# Patient Record
Sex: Female | Born: 1970
Health system: Southern US, Community
[De-identification: ages and names within clinical notes are randomized; demographics above are authoritative.]

## PROBLEM LIST (undated history)

## (undated) DIAGNOSIS — R112 Nausea with vomiting, unspecified: Secondary | ICD-10-CM

## (undated) DIAGNOSIS — IMO0001 Reserved for inherently not codable concepts without codable children: Secondary | ICD-10-CM

## (undated) DIAGNOSIS — F329 Major depressive disorder, single episode, unspecified: Secondary | ICD-10-CM

## (undated) DIAGNOSIS — B009 Herpesviral infection, unspecified: Secondary | ICD-10-CM

## (undated) DIAGNOSIS — IMO0002 Reserved for concepts with insufficient information to code with codable children: Secondary | ICD-10-CM

## (undated) DIAGNOSIS — R600 Localized edema: Secondary | ICD-10-CM

## (undated) DIAGNOSIS — Z9889 Other specified postprocedural states: Secondary | ICD-10-CM

## (undated) DIAGNOSIS — F419 Anxiety disorder, unspecified: Secondary | ICD-10-CM

## (undated) DIAGNOSIS — C50911 Malignant neoplasm of unspecified site of right female breast: Secondary | ICD-10-CM

## (undated) DIAGNOSIS — R609 Edema, unspecified: Secondary | ICD-10-CM

## (undated) DIAGNOSIS — F32A Depression, unspecified: Secondary | ICD-10-CM

## (undated) HISTORY — PX: PLANTAR FASCIECTOMY: SUR600

## (undated) HISTORY — DX: Reserved for inherently not codable concepts without codable children: IMO0001

## (undated) HISTORY — DX: Herpesviral infection, unspecified: B00.9

## (undated) HISTORY — PX: OTHER SURGICAL HISTORY: SHX169

## (undated) HISTORY — DX: Reserved for concepts with insufficient information to code with codable children: IMO0002

---

## 1999-06-04 ENCOUNTER — Other Ambulatory Visit: Admission: RE | Admit: 1999-06-04 | Discharge: 1999-06-04 | Payer: Self-pay | Admitting: *Deleted

## 2000-06-15 ENCOUNTER — Other Ambulatory Visit: Admission: RE | Admit: 2000-06-15 | Discharge: 2000-06-15 | Payer: Self-pay | Admitting: *Deleted

## 2005-07-15 ENCOUNTER — Ambulatory Visit: Payer: Self-pay | Admitting: Orthopedic Surgery

## 2005-08-31 ENCOUNTER — Ambulatory Visit: Payer: Self-pay | Admitting: Orthopedic Surgery

## 2006-12-08 ENCOUNTER — Ambulatory Visit: Payer: Self-pay | Admitting: Unknown Physician Specialty

## 2008-01-17 ENCOUNTER — Emergency Department: Payer: Self-pay | Admitting: Emergency Medicine

## 2008-01-24 ENCOUNTER — Ambulatory Visit: Payer: Self-pay

## 2008-02-14 ENCOUNTER — Ambulatory Visit: Payer: Self-pay

## 2008-06-10 ENCOUNTER — Emergency Department: Payer: Self-pay | Admitting: Emergency Medicine

## 2009-04-27 ENCOUNTER — Emergency Department: Payer: Self-pay | Admitting: Emergency Medicine

## 2013-03-14 ENCOUNTER — Encounter: Payer: Self-pay | Admitting: *Deleted

## 2013-03-19 ENCOUNTER — Ambulatory Visit (INDEPENDENT_AMBULATORY_CARE_PROVIDER_SITE_OTHER): Payer: 59 | Admitting: Podiatry

## 2013-03-19 ENCOUNTER — Encounter: Payer: Self-pay | Admitting: Podiatry

## 2013-03-19 ENCOUNTER — Ambulatory Visit (INDEPENDENT_AMBULATORY_CARE_PROVIDER_SITE_OTHER): Payer: 59

## 2013-03-19 VITALS — BP 122/77 | HR 70 | Resp 16 | Ht 64.5 in | Wt 250.0 lb

## 2013-03-19 DIAGNOSIS — M79609 Pain in unspecified limb: Secondary | ICD-10-CM

## 2013-03-19 DIAGNOSIS — M79671 Pain in right foot: Secondary | ICD-10-CM

## 2013-03-19 DIAGNOSIS — M79672 Pain in left foot: Secondary | ICD-10-CM

## 2013-03-19 DIAGNOSIS — M722 Plantar fascial fibromatosis: Secondary | ICD-10-CM

## 2013-03-19 DIAGNOSIS — M7150 Other bursitis, not elsewhere classified, unspecified site: Secondary | ICD-10-CM

## 2013-03-19 NOTE — Progress Notes (Signed)
Krystal Kelley presents today complaining of pain to the fifth metatarsophalangeal joint area of the right foot. He states that it bothers her with shoe gear. She's also complaining of pain to the plantar lateral aspect of the left heel anterior to the incision site of the endoscopic plantar fasciotomy that was performed several months ago. She states that this area does feels tender and painful with shoe gear.  Objective: Vital signs are stable she is alert and oriented x3. Pulses are strong and equal bilateral foot. She has pain on palpation fifth metatarsophalangeal joint with overlying boggy fluid consistency and tenderness on range of motion of the fifth toe. The contralateral foot does demonstrate pain on palpation just distal to the calcaneus plantar lateral aspect of the left foot. Ready graphic evaluation does not demonstrate any type of osseous abnormalities.  Assessment: Capsulitis bursitis with mild Taylor's bunion deformity fifth metatarsophalangeal joint of the right foot. Residual lateral band plantar fasciitis left foot.  Plan: Injected dexamethasone to the point of maximal tenderness fifth metatarsophalangeal joint right foot. And injected the plantar lateral aspect of the left foot with Kenalog and local anesthetic. Followup with me in one month

## 2013-04-19 HISTORY — PX: CHOLECYSTECTOMY: SHX55

## 2013-04-30 ENCOUNTER — Ambulatory Visit: Payer: 59 | Admitting: Podiatry

## 2013-05-09 ENCOUNTER — Encounter: Payer: Self-pay | Admitting: Podiatry

## 2013-05-09 ENCOUNTER — Ambulatory Visit (INDEPENDENT_AMBULATORY_CARE_PROVIDER_SITE_OTHER): Payer: 59 | Admitting: Podiatry

## 2013-05-09 VITALS — BP 121/86 | HR 109 | Resp 16 | Ht 64.0 in | Wt 245.8 lb

## 2013-05-09 DIAGNOSIS — M722 Plantar fascial fibromatosis: Secondary | ICD-10-CM

## 2013-05-09 NOTE — Progress Notes (Signed)
She presents today for followup of her bilateral foot pain. She states that the last injections we gave must of work she said it took a little while for them to start work at this work eventually. She still concerned about the left orthotic not fitting appropriately. I have evaluated this twice now and suggested shoe gear changes. She has not made shoe gear changes as of yet. Currently her feet are hurting.  Objective: Vital signs are stable she is alert and oriented x3. Pulses are palpable bilateral foot. I reevaluated the orthotics today and evaluated her shoes. She has a rectus foot which is pitting in a curved shoe with a straight orthotic to match her foot. This is resulting and wear to the lateral aspect of the insole of the shoe and it feels to her that her foot is in varus position. Evaluating the orthotics without the shoes having the patient stand on these the foot is rectus the heel is vertical and the forefoot is down.  Assessment: History of plantar fasciitis pes planus lateral compensatory syndrome.  Plan: Discussed appropriate shoe gear stretching exercises ice therapy shoe gear modifications. Encouraged shoe gear change. I will followup with her once she gets a new pair shoes and reevaluate his orthotics. If at that point his left foot does not feel better then we will send his orthotics back and have a pair of orthotics casted rather than scanned.  She was also asking about references for a hand surgeon for her carpal tunnel left. I gave her names of 2 hand orthopedic surgeons in [place] and of the neurosurgery group.

## 2013-06-09 ENCOUNTER — Emergency Department: Payer: Self-pay | Admitting: Emergency Medicine

## 2013-06-09 LAB — CBC
HCT: 40.9 % (ref 35.0–47.0)
HGB: 13.9 g/dL (ref 12.0–16.0)
MCH: 31.9 pg (ref 26.0–34.0)
MCHC: 33.9 g/dL (ref 32.0–36.0)
MCV: 94 fL (ref 80–100)
Platelet: 419 10*3/uL (ref 150–440)
RBC: 4.35 10*6/uL (ref 3.80–5.20)
RDW: 13.5 % (ref 11.5–14.5)
WBC: 10.9 x10 3/mm 3 (ref 3.6–11.0)

## 2013-06-09 LAB — COMPREHENSIVE METABOLIC PANEL
Albumin: 3.6 g/dL (ref 3.4–5.0)
Alkaline Phosphatase: 64 U/L
Bilirubin,Total: 1.9 mg/dL — ABNORMAL HIGH (ref 0.2–1.0)
Co2: 31 mmol/L (ref 21–32)
Creatinine: 1.06 mg/dL (ref 0.60–1.30)
EGFR (Non-African Amer.): >60
Glucose: 110 mg/dL — ABNORMAL HIGH (ref 65–99)
Potassium: 3.8 mmol/L (ref 3.5–5.1)
SGOT(AST): 170 U/L — ABNORMAL HIGH (ref 15–37)
Sodium: 137 mmol/L (ref 136–145)
Total Protein: 8.4 g/dL — ABNORMAL HIGH (ref 6.4–8.2)

## 2013-06-09 LAB — COMPREHENSIVE METABOLIC PANEL WITH GFR
Anion Gap: 5 — ABNORMAL LOW (ref 7–16)
BUN: 11 mg/dL (ref 7–18)
Calcium, Total: 9.4 mg/dL (ref 8.5–10.1)
Chloride: 101 mmol/L (ref 98–107)
EGFR (African American): >60
Osmolality: 274 (ref 275–301)
SGPT (ALT): 133 U/L — ABNORMAL HIGH (ref 12–78)

## 2013-06-09 LAB — TROPONIN I: Troponin-I: <0.02 ng/mL

## 2013-06-09 LAB — LIPASE, BLOOD: Lipase: 144 U/L (ref 73–393)

## 2013-06-10 ENCOUNTER — Inpatient Hospital Stay: Payer: Self-pay | Admitting: Surgery

## 2013-06-10 LAB — HEMATOCRIT: HCT: 39.7 % (ref 35.0–47.0)

## 2013-06-10 LAB — CREATININE, SERUM
Creatinine: 1.22 mg/dL (ref 0.60–1.30)
EGFR (African American): >60
EGFR (Non-African Amer.): 54 — ABNORMAL LOW

## 2013-06-10 LAB — PLATELET COUNT: Platelet: 411 10*3/uL (ref 150–440)

## 2013-06-10 LAB — HEMOGLOBIN: HGB: 13.4 g/dL (ref 12.0–16.0)

## 2013-06-11 LAB — CBC WITH DIFFERENTIAL/PLATELET
Basophil #: 0.1 10*3/uL (ref 0.0–0.1)
Basophil %: 0.5 %
Eosinophil #: 0.1 10*3/uL (ref 0.0–0.7)
Eosinophil %: 0.9 %
HCT: 37.8 % (ref 35.0–47.0)
HGB: 13 g/dL (ref 12.0–16.0)
Lymphocyte %: 24.2 %
Lymphs Abs: 2.4 x10 3/mm 3 (ref 1.0–3.6)
MCH: 32.3 pg (ref 26.0–34.0)
MCHC: 34.2 g/dL (ref 32.0–36.0)
MCV: 94 fL (ref 80–100)
Monocyte #: 0.6 x10 3/mm (ref 0.2–0.9)
Monocyte %: 5.9 %
Neutrophil #: 6.9 10*3/uL — ABNORMAL HIGH (ref 1.4–6.5)
Neutrophil %: 68.5 %
Platelet: 374 10*3/uL (ref 150–440)
RBC: 4.01 X10 6/mm 3 (ref 3.80–5.20)
RDW: 14.1 % (ref 11.5–14.5)
WBC: 10.1 x10 3/mm 3 (ref 3.6–11.0)

## 2013-06-11 LAB — COMPREHENSIVE METABOLIC PANEL
Alkaline Phosphatase: 88 U/L
Anion Gap: 7 (ref 7–16)
BUN: 15 mg/dL (ref 7–18)
Bilirubin,Total: 3.5 mg/dL — ABNORMAL HIGH (ref 0.2–1.0)
Co2: 26 mmol/L (ref 21–32)
Creatinine: 1.16 mg/dL (ref 0.60–1.30)
EGFR (African American): >60
EGFR (Non-African Amer.): 58 — ABNORMAL LOW
Potassium: 3.2 mmol/L — ABNORMAL LOW (ref 3.5–5.1)
SGOT(AST): 165 U/L — ABNORMAL HIGH (ref 15–37)
SGPT (ALT): 193 U/L — ABNORMAL HIGH (ref 12–78)
Sodium: 135 mmol/L — ABNORMAL LOW (ref 136–145)
Total Protein: 7.3 g/dL (ref 6.4–8.2)

## 2013-06-11 LAB — COMPREHENSIVE METABOLIC PANEL WITH GFR
Albumin: 3.2 g/dL — ABNORMAL LOW (ref 3.4–5.0)
Calcium, Total: 8.7 mg/dL (ref 8.5–10.1)
Chloride: 102 mmol/L (ref 98–107)
Glucose: 102 mg/dL — ABNORMAL HIGH (ref 65–99)
Osmolality: 271 (ref 275–301)

## 2013-06-11 LAB — PREGNANCY, URINE: Pregnancy Test, Urine: NEGATIVE m[IU]/mL

## 2013-06-12 LAB — HEPATIC FUNCTION PANEL A (ARMC)
Albumin: 3 g/dL — ABNORMAL LOW (ref 3.4–5.0)
Alkaline Phosphatase: 75 U/L
Bilirubin, Direct: 0.3 mg/dL — ABNORMAL HIGH (ref 0.00–0.20)
Bilirubin,Total: 0.5 mg/dL (ref 0.2–1.0)
SGOT(AST): 137 U/L — ABNORMAL HIGH (ref 15–37)
SGPT (ALT): 192 U/L — ABNORMAL HIGH (ref 12–78)
Total Protein: 7.1 g/dL (ref 6.4–8.2)

## 2013-06-13 LAB — PATHOLOGY REPORT

## 2013-09-03 ENCOUNTER — Ambulatory Visit (INDEPENDENT_AMBULATORY_CARE_PROVIDER_SITE_OTHER): Payer: 59 | Admitting: Podiatry

## 2013-09-03 VITALS — BP 116/73 | HR 104 | Resp 16 | Ht 65.0 in | Wt 238.0 lb

## 2013-09-03 DIAGNOSIS — M7750 Other enthesopathy of unspecified foot: Secondary | ICD-10-CM

## 2013-09-03 DIAGNOSIS — M722 Plantar fascial fibromatosis: Secondary | ICD-10-CM

## 2013-09-03 DIAGNOSIS — M7751 Other enthesopathy of right foot: Secondary | ICD-10-CM

## 2013-09-03 NOTE — Progress Notes (Signed)
She presents today with a chief complaint of pain to the fifth metatarsal head of the right foot and pain to the plantar lateral and plantar central aspect of the left foot. I have reviewed her past medical history medications and allergies.  Objective: Pulses are strongly palpable bilateral. She has pain on palpation of the fifth metatarsal head of the right foot with a palpable bursitis. She also has pain to the lateral aspect of the left foot and plantar central aspect of the left foot associated with the fourth fifth met cuboid articulation area. More than likely this is still long-standing plantar fasciitis.  Assessment: Capsulitis bursitis fifth metatarsophalangeal joint of the right foot plantar fasciitis central band lateral aspect left foot.  Plan: Injected dexamethasone and local anesthetic to the fifth metatarsophalangeal joint right foot. And injected the left lateral and plantar aspect of the foot with Kenalog and local anesthetic. She will bring her orthotics with her next time because she thinks that her orthotics are causing her to walk to the lateral side of her foot. I will followup with her in one month

## 2013-09-24 ENCOUNTER — Ambulatory Visit: Payer: Self-pay | Admitting: Podiatry

## 2013-09-27 ENCOUNTER — Encounter: Payer: Self-pay | Admitting: Podiatry

## 2013-09-27 ENCOUNTER — Ambulatory Visit (INDEPENDENT_AMBULATORY_CARE_PROVIDER_SITE_OTHER): Payer: 59 | Admitting: Podiatry

## 2013-09-27 DIAGNOSIS — M7150 Other bursitis, not elsewhere classified, unspecified site: Secondary | ICD-10-CM

## 2013-09-27 DIAGNOSIS — G5792 Unspecified mononeuropathy of left lower limb: Secondary | ICD-10-CM

## 2013-09-27 DIAGNOSIS — G5790 Unspecified mononeuropathy of unspecified lower limb: Secondary | ICD-10-CM

## 2013-09-28 NOTE — Progress Notes (Signed)
She presents today with a chief complaint of pain to her left hee and pain to the fifth metatarsophalangeal joint area of the right foot.  Objective: She has pain on palpation of the left heel. Chronic plantar fasciitis versus neuritis. Pain on palpation to the fifth metatarsophalangeal joint and oriented range of motion right foot.  Assessment: Plantar fasciitis versus neuritis left foot. Capsulitis bursitis fifth metatarsophalangeal joint right foot.  Plan: Injected Kenalog and local anesthetic fifth metatarsophalangeal joint right foot today started her first dose of dehydrated alcohol left foot today. Followup with her in 3 weeks

## 2013-10-01 ENCOUNTER — Ambulatory Visit: Payer: 59 | Admitting: Podiatry

## 2013-10-08 ENCOUNTER — Encounter: Payer: Self-pay | Admitting: Podiatry

## 2013-10-15 ENCOUNTER — Encounter: Payer: Self-pay | Admitting: Podiatry

## 2013-10-17 ENCOUNTER — Ambulatory Visit: Payer: 59 | Admitting: Podiatry

## 2013-10-22 ENCOUNTER — Ambulatory Visit: Payer: 59 | Admitting: Podiatry

## 2013-10-29 ENCOUNTER — Encounter: Payer: Self-pay | Admitting: Podiatry

## 2013-10-29 ENCOUNTER — Ambulatory Visit: Payer: 59 | Admitting: Podiatry

## 2013-10-29 VITALS — BP 139/88 | HR 100 | Resp 12

## 2013-10-29 DIAGNOSIS — G5790 Unspecified mononeuropathy of unspecified lower limb: Secondary | ICD-10-CM

## 2013-10-29 DIAGNOSIS — M778 Other enthesopathies, not elsewhere classified: Secondary | ICD-10-CM

## 2013-10-29 DIAGNOSIS — G5792 Unspecified mononeuropathy of left lower limb: Secondary | ICD-10-CM

## 2013-10-29 DIAGNOSIS — M79609 Pain in unspecified limb: Secondary | ICD-10-CM

## 2013-10-29 DIAGNOSIS — M779 Enthesopathy, unspecified: Secondary | ICD-10-CM

## 2013-10-29 DIAGNOSIS — M7750 Other enthesopathy of unspecified foot: Secondary | ICD-10-CM

## 2013-10-29 NOTE — Progress Notes (Signed)
She presents today complaining that her orthotics maker rollout of the left foot. She's also complaining of pain to the plantar aspect of the fifth metatarsal base of the fifth metatarsocuboid articulation. She states that her left foot is doing much better but the heel is still tender sometimes.  Objective: Vital signs are stable she is alert and oriented x3. She has pain on palpation to the plantar aspect of the fifth metatarsal base of the right foot. And to the medial left heel.  Assessment: Neuritis left foot capsulitis right foot.  Plan: Discussed etiology pathology conservative versus surgical therapies. I injected Kenalog fifth metatarsal base of the right foot. I injected her second dose of dehydrated alcohol to her left heel. Followup with her in 3 weeks. I did suggest with a pad the lateral aspect of her left orthotic. Should this not work for her then we will consider getting her  .[name]

## 2013-11-19 ENCOUNTER — Ambulatory Visit: Payer: 59 | Admitting: Podiatry

## 2013-11-26 ENCOUNTER — Ambulatory Visit (INDEPENDENT_AMBULATORY_CARE_PROVIDER_SITE_OTHER): Payer: 59 | Admitting: Podiatry

## 2013-11-26 ENCOUNTER — Encounter: Payer: Self-pay | Admitting: Podiatry

## 2013-11-26 VITALS — BP 128/88 | HR 99 | Resp 16

## 2013-11-26 DIAGNOSIS — M722 Plantar fascial fibromatosis: Secondary | ICD-10-CM

## 2013-11-26 DIAGNOSIS — G5790 Unspecified mononeuropathy of unspecified lower limb: Secondary | ICD-10-CM

## 2013-11-26 DIAGNOSIS — G5792 Unspecified mononeuropathy of left lower limb: Secondary | ICD-10-CM

## 2013-11-26 NOTE — Progress Notes (Signed)
She presents today for followup of her fifth metatarsal base capsulitis and neuritis to her left foot.  Objective: Vital signs are stable she is alert and oriented x3. She has no pain on palpation fifth metatarsal base of the right foot. However she does have pain on palpation to the plantar medial calcaneal tubercle of the left heel.  Assessment: Plantar fasciitis/neuritis plantar left heel. Resolving capsulitis tendinitis right foot.  Plan: Injected her second dose of dehydrated alcohol to the left heel today. Followup with her in 3 weeks.

## 2013-12-10 ENCOUNTER — Telehealth: Payer: Self-pay | Admitting: *Deleted

## 2013-12-10 NOTE — Telephone Encounter (Signed)
Marie tickle with [employer] called stating Adeli has to be consistant with dress code policy. Lelan Pons asked me to write down and fax her that pt is to wear tennis shoes only to work mon-fri. On same letter that was sent over on [phone]. Faxed info to [phone].

## 2013-12-11 ENCOUNTER — Telehealth: Payer: Self-pay | Admitting: *Deleted

## 2013-12-11 NOTE — Telephone Encounter (Signed)
Pt called asking about note sent over to her employer. i stated that the employer said the wearing of her shoes has to be consistent Mon-Fri. i stated per dr Milinda Pointer pt is to wear tennis shoes M-F to work. Pt understood.

## 2013-12-17 ENCOUNTER — Ambulatory Visit: Payer: 59 | Admitting: Podiatry

## 2013-12-27 ENCOUNTER — Ambulatory Visit: Payer: 59 | Admitting: Podiatry

## 2013-12-31 ENCOUNTER — Ambulatory Visit (INDEPENDENT_AMBULATORY_CARE_PROVIDER_SITE_OTHER): Payer: 59 | Admitting: Podiatry

## 2013-12-31 VITALS — BP 137/86 | HR 113 | Resp 16

## 2013-12-31 DIAGNOSIS — G5790 Unspecified mononeuropathy of unspecified lower limb: Secondary | ICD-10-CM

## 2013-12-31 DIAGNOSIS — G5792 Unspecified mononeuropathy of left lower limb: Secondary | ICD-10-CM

## 2013-12-31 DIAGNOSIS — M7150 Other bursitis, not elsewhere classified, unspecified site: Secondary | ICD-10-CM

## 2013-12-31 NOTE — Progress Notes (Signed)
She presents today for followup of her neuritis and plantar fasciitis her left foot she states it is better than it has been previously  Objective: Vital signs are stable she is alert and oriented x3. There is no erythema edema saline is drainage or odor she has mild tenderness on palpation medial continued tubercle of the left heel.  Assessment: In limb secondary to flexors neuritis/plantar fasciitis.  Plan: Injected dehydrated alcohol left heel. Followup with her in 3-4 weeks

## 2014-01-17 ENCOUNTER — Ambulatory Visit: Payer: Self-pay

## 2014-01-21 ENCOUNTER — Ambulatory Visit: Payer: 59 | Admitting: Podiatry

## 2014-02-13 ENCOUNTER — Encounter: Payer: Self-pay | Admitting: Podiatry

## 2014-02-13 ENCOUNTER — Ambulatory Visit (INDEPENDENT_AMBULATORY_CARE_PROVIDER_SITE_OTHER): Payer: 59 | Admitting: Podiatry

## 2014-02-13 DIAGNOSIS — M722 Plantar fascial fibromatosis: Secondary | ICD-10-CM

## 2014-02-13 DIAGNOSIS — G5792 Unspecified mononeuropathy of left lower limb: Secondary | ICD-10-CM

## 2014-02-13 NOTE — Progress Notes (Signed)
She presents today for follow-up of neuritis to her plantar left heel. She states that the alcohol injections are working quite well. She states that it only hurts a little bit now.  Objective: Vital signs are stable she is alert and oriented 3. No erythema edema saline as drainage or odor. Minimal pain on palpation medially continue tubercle left.  Assessment: Plantar fasciitis and neuritis left heel.  Plan: Injected another dose of dehydrated alcohol to the left heel. We'll follow up with her in 3 weeks if needed.

## 2014-03-06 ENCOUNTER — Ambulatory Visit (INDEPENDENT_AMBULATORY_CARE_PROVIDER_SITE_OTHER): Payer: 59 | Admitting: Podiatry

## 2014-03-06 VITALS — BP 137/90 | HR 102 | Resp 16

## 2014-03-06 DIAGNOSIS — G5792 Unspecified mononeuropathy of left lower limb: Secondary | ICD-10-CM

## 2014-03-06 DIAGNOSIS — Q828 Other specified congenital malformations of skin: Secondary | ICD-10-CM

## 2014-03-06 DIAGNOSIS — M722 Plantar fascial fibromatosis: Secondary | ICD-10-CM

## 2014-03-06 NOTE — Progress Notes (Signed)
She presents today for a follow-up of neuritis left heel. She states it is almost done.  Objective: Pulses are palpable. No reproducible pain left heel today. Her keratotic lesion plantar lateral aspect left foot.  Assessment: Plantar fasciitis/neuritis Baxter's nerve left. Porokeratosis left.  Plan: Debridement of porokeratotic lesion. Injected her final dose of dehydrated alcohol today. We'll follow up with her next visit and reevaluate her orthotics.

## 2014-03-27 ENCOUNTER — Ambulatory Visit (INDEPENDENT_AMBULATORY_CARE_PROVIDER_SITE_OTHER): Payer: 59 | Admitting: Podiatry

## 2014-03-27 VITALS — BP 141/84 | HR 114 | Resp 16

## 2014-03-27 DIAGNOSIS — G5792 Unspecified mononeuropathy of left lower limb: Secondary | ICD-10-CM

## 2014-03-27 DIAGNOSIS — M722 Plantar fascial fibromatosis: Secondary | ICD-10-CM

## 2014-03-27 NOTE — Progress Notes (Signed)
She presents today stating that she is here to get her shot that she states that her heel is approximately 95% improved.  Objective: Vital signs are stable she is alert and oriented 3. She has pain on palpation he continue typical of the left heel.  Assessment: Chronic intractable neuritis point her fasciitis left heel.  Plan: Reinjected today with dehydrated alcohol follow-up with her in 3-4 weeks.

## 2014-04-19 DIAGNOSIS — C50911 Malignant neoplasm of unspecified site of right female breast: Secondary | ICD-10-CM

## 2014-04-19 HISTORY — DX: Malignant neoplasm of unspecified site of right female breast: C50.911

## 2014-04-22 ENCOUNTER — Ambulatory Visit: Payer: 59 | Admitting: Podiatry

## 2014-04-29 ENCOUNTER — Ambulatory Visit: Payer: 59 | Admitting: Podiatry

## 2014-05-13 ENCOUNTER — Ambulatory Visit (INDEPENDENT_AMBULATORY_CARE_PROVIDER_SITE_OTHER): Payer: 59 | Admitting: Podiatry

## 2014-05-13 DIAGNOSIS — G5792 Unspecified mononeuropathy of left lower limb: Secondary | ICD-10-CM

## 2014-05-13 DIAGNOSIS — G5761 Lesion of plantar nerve, right lower limb: Secondary | ICD-10-CM

## 2014-05-13 DIAGNOSIS — G5781 Other specified mononeuropathies of right lower limb: Secondary | ICD-10-CM

## 2014-05-13 NOTE — Progress Notes (Signed)
She presents today for follow-up of her neuritis to her left heel. Also complaining of paresthesias and numbness to the fifth metatarsophalangeal joint area of the right foot. She denies any trauma.  Objective: Vital signs stable she is alert and oriented 3. Pulses are palpable bilateral. Neurologic sensorium is intact per Semmes-Weinstein monofilament. She has pain on palpation with a palpable Mulder's click to the third interdigital space of the right foot more than likely resulting in her paresthesias. She has minimal tenderness on palpation of the plantar medial calcaneal tubercle at Baxter's nerve left foot.  Assessment: Probable neuroma third interdigital space right foot. Baxter's neuritis/plantar fasciitis left foot.  Plan: Injected the third interdigital space with Kenalog and local anesthetic today. Injected the left heel was dehydrated alcohol and I will follow-up with her in 3 weeks.

## 2014-05-16 HISTORY — PX: BREAST BIOPSY: SHX20

## 2014-05-20 ENCOUNTER — Other Ambulatory Visit: Payer: Self-pay | Admitting: Radiology

## 2014-05-20 DIAGNOSIS — C50911 Malignant neoplasm of unspecified site of right female breast: Secondary | ICD-10-CM

## 2014-05-23 ENCOUNTER — Telehealth: Payer: Self-pay | Admitting: *Deleted

## 2014-05-23 NOTE — Telephone Encounter (Signed)
Received a referral from [facility].  Called and left a message for the pt to return my call so I can schedule a med onc appt.

## 2014-05-23 NOTE — Telephone Encounter (Signed)
Pt returned my call and I confirmed 05/30/14 appt w/ her. Mailed before appt letter, calendar, welcoming packet & intake form to pt.  Emailed Engineer, civil (consulting) at [employer] to make her aware.  Informed Bary Castilla for the conference list.

## 2014-05-28 ENCOUNTER — Encounter: Payer: Self-pay | Admitting: Radiation Oncology

## 2014-05-28 ENCOUNTER — Ambulatory Visit
Admission: RE | Admit: 2014-05-28 | Discharge: 2014-05-28 | Disposition: A | Discharge status: Home / Self Care | Payer: 59 | Source: Ambulatory Visit | Attending: Radiology | Admitting: Radiology

## 2014-05-28 DIAGNOSIS — C50911 Malignant neoplasm of unspecified site of right female breast: Secondary | ICD-10-CM

## 2014-05-28 MED ORDER — GADOBENATE DIMEGLUMINE 529 MG/ML IV SOLN
20.0000 mL | Freq: Once | INTRAVENOUS | Status: AC | PRN
Start: 2014-05-28 — End: 2014-05-28

## 2014-05-28 NOTE — Progress Notes (Signed)
Location of Breast Cancer: right lower inner breast cancer   Histology per Pathology Report:   05/16/14   Receptor Status: ER(95% positive), PR (77% positive), Her2-neu (positive)  Did patient present with symptoms (if so, please note symptoms) or was this found on screening mammography?: patient was able to feel a lump near the skin in the medial aspect of her right breast.  Past/Anticipated interventions by surgeon, if any: possible radioactive seed localized right breast lumpectomy and right axillary sentinel lymph node biopsy with port a cath placement pending results of MRI and medical oncology evaluation.  Past/Anticipated interventions by medical oncology, if any: has apt with Dr. Lindi Adie on 05/30/14  Lymphedema issues, if any: no  Pain issues, if any: no  OB GYN history: age of menarche: 13 years, has used BCP for 30 years,  Has not had any children, she does have a niece that she takes care of.  SAFETY ISSUES:  Prior radiation? yes  Pacemaker/ICD? no  Possible current pregnancy?no  Is the patient on methotrexate? no  Current Complaints / other details:  Patient is here with her mother.     Jacqulyn Liner, RN 05/28/2014,8:19 AM

## 2014-05-29 ENCOUNTER — Encounter: Payer: Self-pay | Admitting: Radiation Oncology

## 2014-05-29 ENCOUNTER — Other Ambulatory Visit (INDEPENDENT_AMBULATORY_CARE_PROVIDER_SITE_OTHER): Payer: Self-pay | Admitting: General Surgery

## 2014-05-29 ENCOUNTER — Ambulatory Visit
Admission: RE | Admit: 2014-05-29 | Discharge: 2014-05-29 | Disposition: A | Discharge status: Home / Self Care | Payer: 59 | Source: Ambulatory Visit | Attending: Radiation Oncology | Admitting: Radiation Oncology

## 2014-05-29 VITALS — BP 127/81 | HR 94 | Temp 98.0°F | Resp 16 | Ht 65.0 in | Wt 235.1 lb

## 2014-05-29 DIAGNOSIS — C50311 Malignant neoplasm of lower-inner quadrant of right female breast: Secondary | ICD-10-CM | POA: Diagnosis not present

## 2014-05-29 DIAGNOSIS — C50911 Malignant neoplasm of unspecified site of right female breast: Secondary | ICD-10-CM

## 2014-05-29 HISTORY — DX: Malignant neoplasm of unspecified site of right female breast: C50.911

## 2014-05-29 NOTE — Progress Notes (Signed)
Please see the Nurse Progress Note in the MD Initial Consult Encounter for this patient. 

## 2014-05-29 NOTE — Progress Notes (Signed)
Radiation Oncology         [phone]) [phone] ________________________________  Initial Outpatient Consultation  Name: Krystal Kelley MRN: [id number]  Date: 05/29/2014  DOB: 1970/05/07  VB:TYOM,AYOKH C, MD  Excell Seltzer, MD   REFERRING PHYSICIAN: Excell Seltzer, MD  DIAGNOSIS: Clinical Stage I invasive Ductal and In-Situ Carcinoma of the Right breast, ER+, PR+, HER2 Neu + (Triple Positive)  HISTORY OF PRESENT ILLNESS::Krystal Kelley is a 44 y.o. female who is seen out courtesy of Dr Excell Seltzer for an opinion concerning radiation therapy as part of management of patient's recently diagnosed right breast cancer. Earlier this year the patient palpated a nodule in the medial aspect of her right breast. Subsequent imaging included a diagnostic mammogram which showed a 1.5 cm mass with indistinct margins along the anterior aspect of the 3:00 position of the right breast. Ultrasound revealed an irregular hypoechoic mass at the 3:00 position measuring 1.1 cm. Patient proceeded to undergo ultrasound guided biopsy of this area on January 28 which revealed invasive ductal carcinoma and ductal carcinoma in situ, grade 1-2. Estrogen receptor was positive at 95% and progesterone receptor was positive at 77% proliferation marker was elevated at 43%. The tumor was HER-2/neu positive and MRI performed yesterday revealed a solitary lesion in the anterior lower inner aspect of the right breast measuring 1.4 x 1.2 x 1.0 cm. No suspicious lymphadenopathy was noted or other suspicious areas. With this information the patient is now seen in radiation oncology for evaluation.[name]  PREVIOUS RADIATION THERAPY: No  PAST MEDICAL HISTORY:  has a past medical history of Breast cancer, right breast.    PAST SURGICAL HISTORY: Past Surgical History  Procedure Laterality Date  . Bunion surgrery    . Carpel tunnel    . Cholecystectomy    . Breast biopsy  05/16/14  d  FAMILY HISTORY: family history is not on  file.  SOCIAL HISTORY:  reports that she has never smoked. She has never used smokeless tobacco. She reports that she does not drink alcohol or use illicit drugs. works for [employer] in [place], full-time, accompanied by her mother on evaluation today. Premenopausal  ALLERGIES: Review of patient's allergies indicates no known allergies.  MEDICATIONS:  Current Outpatient Prescriptions  Medication Sig Dispense Refill  . Eluxadoline 100 MG TABS Take 2 tablets by mouth daily.    . hydrochlorothiazide (HYDRODIURIL) 25 MG tablet Take 25 mg by mouth daily.    [name] ketoprofen (ORUDIS) 50 MG capsule Take 50 mg by mouth as needed.    . phentermine 37.5 MG capsule Take 37.5 mg by mouth every morning.    Lenda Kelp 1/20 1-20 MG-MCG tablet   4  . Norethin Ace-Eth Estrad-FE (JUNEL FE 1/20 PO) Take by mouth.     No current facility-administered medications for this encounter.    REVIEW OF SYSTEMS:  A 15 point review of systems is documented in the electronic medical record. This was obtained by the nursing staff. However, I reviewed this with the patient to discuss relevant findings and make appropriate changes.  The patient palpated the area of concern on self examination. She denies any nipple discharge or bleeding.   PHYSICAL EXAM:  height is '5\' 5"'  (1.651 m) and weight is 235 lb 1.6 oz (106.641 kg). Her oral temperature is 98 F (36.7 C). Her blood pressure is 127/81 and her pulse is 94. Her respiration is 16.   BP 127/81 mmHg  Pulse 94  Temp(Src) 98 F (36.7 C) (Oral)  Resp 16  Ht '5\' 5"'  (1.651 m)  Wt 235 lb 1.6 oz (106.641 kg)  BMI 39.12 kg/m2  General Appearance:    Alert, cooperative, no distress, appears stated age  Head:    Normocephalic, without obvious abnormality, atraumatic  Eyes:    PERRL, conjunctiva/corneas clear, EOM's intact,      Ears:    Normal TM's and external ear canals, both ears  Nose:   Nares normal, septum midline, mucosa normal, no drainage    or sinus tenderness   Throat:   Lips, mucosa, and tongue normal; teeth and gums normal  Neck:   Supple, symmetrical, trachea midline, no adenopathy;    thyroid:  no enlargement/tenderness/nodules; no carotid   bruit or JVD  Back:     Symmetric, no curvature, ROM normal, no CVA tenderness  Lungs:     Clear to auscultation bilaterally, respirations unlabored  Chest Wall:    No tenderness or deformity   Heart:    Regular rate and rhythm, S1 and S2 normal, no murmur, rub   or gallop  Breast Exam:    No tenderness, masses, or nipple abnormality involving the left breast; the right breast examination reveals a 1 cm firm nodule along the anterior breast tissue in the 3:30 position of the right breast. This is approximately 3 cm from the areolar border. No other suspicious areas are noted in the right breast.   Abdomen:     Soft, non-tender, bowel sounds active all four quadrants,    no masses, no organomegaly        Extremities:   Extremities normal, atraumatic, no cyanosis or edema  Pulses:   2+ and symmetric all extremities  Skin:   Skin color, texture, turgor normal, no rashes or lesions  Lymph nodes:   Cervical, supraclavicular, and axillary nodes normal  Neurologic:    normal strength, sensation and reflexes    throughout     ECOG = 0  0 - Asymptomatic (Fully active, able to carry on all predisease activities without restriction)  LABORATORY DATA:  No results found for: WBC, HGB, HCT, MCV, PLT, NEUTROABS No results found for: NA, K, CL, CO2, GLUCOSE, CREATININE, CALCIUM    RADIOGRAPHY: Mr Breast Bilateral W Wo Contrast  05/28/2014   CLINICAL DATA:  Recently diagnosed invasive ductal carcinoma and ductal carcinoma in situ in the lower inner quadrant of the right breast.  LABS:  None obtained today.  EXAM: BILATERAL BREAST MRI WITH AND WITHOUT CONTRAST  TECHNIQUE: Multiplanar, multisequence MR images of both breasts were obtained prior to and following the intravenous administration of 20 ml of MultiHance.   THREE-DIMENSIONAL MR IMAGE RENDERING ON INDEPENDENT WORKSTATION:  Three-dimensional MR images were rendered by post-processing of the original MR data on an independent workstation. The three-dimensional MR images were interpreted, and findings are reported in the following complete MRI report for this study. Three dimensional images were evaluated at the independent DynaCad workstation  COMPARISON:  Recent mammogram, ultrasound and biopsy examinations at [facility].  FINDINGS: Breast composition: b.  Scattered fibroglandular tissue.  Background parenchymal enhancement: Minimal  Right breast: 1.4 x 1.2 x 1.0 cm oval, circumscribed, enhancing mass containing a biopsy marker clip artifact in the anterior aspect of the lower inner quadrant. This has predominantly plateau enhancement kinetics.  Left breast: No mass or abnormal enhancement.  Lymph nodes: No abnormal appearing lymph nodes.  Ancillary findings:  None.  IMPRESSION: 1.4 x 1.2 x 1.0 cm biopsy-proven invasive ductal carcinoma and ductal carcinoma in situ in  the lower inner quadrant of the right breast. Otherwise, unremarkable examination.  RECOMMENDATION: Treatment plan.  BI-RADS CATEGORY  6: Known biopsy-proven malignancy.   Electronically Signed   By: Claudie Revering M.D.   On: 05/28/2014 12:27      IMPRESSION: Clinical Stage I invasive Ductal and In-Situ Carcinoma of the Right breast, ER+, PR+, HER2 Neu +. The patient would appear to be a good candidate for breast conservation with lumpectomy and sentinel node procedure with radiation therapy directed at the right breast at a later date.. The patient does wish to proceed with breast conserving therapy. Patient would be a candidate for adjuvant hormonal therapy. She would also likely benefit for Herceptin based chemotherapy in light of her HER-2/neu positive tumor. The patient will meet with medical oncology in the near future for more detailed evaluation concerning these issues.  PLAN: The  patient will be reevaluated in the postoperative setting. Given the small tumor size I doubt that the patient would benefit from neoadjuvant chemotherapy but medical oncology and Gen. surgery will discuss this issue at a later date.  I spent 60 minutes minutes face to face with the patient and more than 50% of that time was spent in counseling and/or coordination of care.   ------------------------------------------------  Blair Promise, PhD, MD

## 2014-05-30 ENCOUNTER — Encounter: Payer: Self-pay | Admitting: *Deleted

## 2014-05-30 ENCOUNTER — Ambulatory Visit: Payer: 59

## 2014-05-30 ENCOUNTER — Ambulatory Visit (HOSPITAL_BASED_OUTPATIENT_CLINIC_OR_DEPARTMENT_OTHER): Payer: 59 | Admitting: Hematology and Oncology

## 2014-05-30 ENCOUNTER — Other Ambulatory Visit: Payer: Self-pay | Admitting: *Deleted

## 2014-05-30 ENCOUNTER — Telehealth: Payer: Self-pay | Admitting: Hematology and Oncology

## 2014-05-30 VITALS — BP 140/85 | HR 79 | Temp 98.6°F | Resp 20 | Ht 65.0 in | Wt 235.4 lb

## 2014-05-30 DIAGNOSIS — Z170 Estrogen receptor positive status [ER+]: Secondary | ICD-10-CM

## 2014-05-30 DIAGNOSIS — C50311 Malignant neoplasm of lower-inner quadrant of right female breast: Secondary | ICD-10-CM

## 2014-05-30 NOTE — Telephone Encounter (Signed)
, °

## 2014-05-30 NOTE — Progress Notes (Signed)
Met with pt during new pt appt with Dr. Lindi Adie. Gave navigation resources and contact information. Pt denies needs at this time. Encourage pt to call with future questions or concerns. Received verbal understanding.

## 2014-05-30 NOTE — Progress Notes (Signed)
Note created by Dr. Gudena during office visit. Copy to patient, original to scan. 

## 2014-05-30 NOTE — Assessment & Plan Note (Addendum)
Right breast invasive ductal carcinoma ER/PR positive HER-2 +1.3 cm by MRI [id number] stage I A clinical stage, grade 1-2, Ki-67 43%  Pathology and radiology counseling:Discussed with the patient, the details of pathology including the type of breast cancer,the clinical staging, the significance of ER, PR and HER-2/neu receptors and the implications for treatment. After reviewing the pathology in detail, we proceeded to discuss the different treatment options between surgery, radiation, chemotherapy, antiestrogen therapies.  Recommendation: 1. Lumpectomy followed by 2. Adjuvant chemotherapy with Taxotere, carboplatin, Herceptin every 3 weeks 6 cycles followed by Herceptin maintenance for 1 year; however if the cancer is less than 1 cm, I would plan on giving her Taxol Herceptin weekly 12 followed by maintenance Herceptin for 1 year 3. Followed by adjuvant radiation 4. Followed by oral antiestrogen therapy with tamoxifen  Chemotherapy counseling: I discussed the chemotherapy regimen great detail including the number of cycles as well as the side effects of treatment including nausea vomiting, hair loss, risk of infection, cardiac toxicities related to Herceptin, neuropathy was switched to Taxotere, fatigue, these changes, decrease in blood counts, fatigue. I will discuss the chemotherapy once again after she completes surgery.   Plan: 1. Surgery with lumpectomy 2. Port placement during surgery 3. She will need to be scheduled for echocardiogram, chemotherapy class subsequent to surgery.  Return to clinic after surgery for further discussion.

## 2014-05-30 NOTE — Progress Notes (Signed)
[place] NOTE  Patient Care Team: Krystal Billet, MD as PCP - General (Internal Medicine) Krystal Billet, MD (Internal Medicine)  CHIEF COMPLAINTS/PURPOSE OF CONSULTATION:  Newly diagnosed breast cancer  HISTORY OF PRESENTING ILLNESS:  Krystal Kelley 44 y.o. female is here because of recent diagnosis of Right breast cancer. Patient was examining her chest and felt a lump and brought it to the attention of her gynecologist. Krystal Kelley set her up for a mammogram and ultrasound and a biopsy. The final pathology came back as invasive ductal carcinoma with DCIS that was ER/PR positive as well as HER-2 positive with a ratio of 4.02. She was evaluated by Dr. Excell Kelley and was sent was 4 discussion regarding treatment options.  I reviewed her records extensively and collaborated the history with the patient.  SUMMARY OF ONCOLOGIC HISTORY:   Breast cancer of lower-inner quadrant of right female breast   05/16/2014 Initial Diagnosis Ultrasound-guided biopsy: Invasive ductal carcinoma with DCIS, grade 1-2, ER/PR positive, Ki-67 43%, HER-2 positive   05/28/2014 Breast MRI Right breast: 1.4 x 1.2 x 1 cm oval enhancing mass lower inner quadrant    In terms of breast cancer risk profile:  She menarched at early age of  61 and went to menopause at age 35  She had 0 pregnancies  She has not received birth control pills .  She was never exposed to fertility medications or hormone replacement therapy.  She has no family history of Breast/GYN/GI cancer  MEDICAL HISTORY:  Past Medical History  Diagnosis Date  . Breast cancer, right breast     SURGICAL HISTORY: Past Surgical History  Procedure Laterality Date  . Bunion surgrery    . Carpel tunnel    . Cholecystectomy    . Breast biopsy  05/16/14    SOCIAL HISTORY: History   Social History  . Marital Status: Married    Spouse Name: N/A  . Number of Children: 0  . Years of Education: N/A   Occupational History  . 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 Smoking status: Never Smoker   . Smokeless tobacco: Never Used  . Alcohol Use: No  . Drug Use: No  . Sexual Activity: Not on file   Other Topics Concern  . Not on file   Social History Narrative    FAMILY HISTORY: No family history on file.  ALLERGIES:  has No Known Allergies.  MEDICATIONS:  Current Outpatient Prescriptions  Medication Sig Dispense Refill  . hydrochlorothiazide (HYDRODIURIL) 25 MG tablet Take 25 mg by mouth daily.    [name] ketoprofen (ORUDIS) 50 MG capsule Take 50 mg by mouth as needed.    . phentermine 37.5 MG capsule Take 37.5 mg by mouth every morning.     No current facility-administered medications for this visit.    REVIEW OF SYSTEMS:   Constitutional: Denies fevers, chills or abnormal night sweats Eyes: Denies blurriness of vision, double vision or watery eyes Ears, nose, mouth, throat, and face: Denies mucositis or sore throat Respiratory: Denies cough, dyspnea or wheezes Cardiovascular: Denies palpitation, chest discomfort or lower extremity swelling Gastrointestinal:  Denies nausea, heartburn or change in bowel habits Skin: Denies abnormal skin rashes Lymphatics: Denies new lymphadenopathy or easy bruising Neurological:Denies numbness, tingling or new weaknesses Behavioral/Psych: Mood is stable, no new changes  Breast: patient felt a lump in the right breast All other systems were reviewed with the patient and are negative.  PHYSICAL EXAMINATION: ECOG  PERFORMANCE STATUS: 0 - Asymptomatic  Filed Vitals:   05/30/14 1316  BP: 140/85  Pulse: 79  Temp: 98.6 F (37 C)  Resp: 20   Filed Weights   05/30/14 1316  Weight: 235 lb 6.4 oz (106.777 kg)    GENERAL:alert, no distress and comfortable SKIN: skin color, texture, turgor are normal, no rashes or significant lesions EYES: normal, conjunctiva are pink and non-injected, sclera clear OROPHARYNX:no exudate, no erythema and lips, buccal  mucosa, and tongue normal  NECK: supple, thyroid normal size, non-tender, without nodularity LYMPH:  no palpable lymphadenopathy in the cervical, axillary or inguinal LUNGS: clear to auscultation and percussion with normal breathing effort HEART: regular rate & rhythm and no murmurs and no lower extremity edema ABDOMEN:abdomen soft, non-tender and normal bowel sounds Musculoskeletal:no cyanosis of digits and no clubbing  PSYCH: alert & oriented x 3 with fluent speech NEURO: no focal motor/sensory deficits  RADIOGRAPHIC STUDIES: I have personally reviewed the radiological reports and agreed with the findings in the report. Results are summarized as above  ASSESSMENT AND PLAN:  Breast cancer of lower-inner quadrant of right female breast Right breast invasive ductal carcinoma ER/PR positive HER-2 +1.3 cm by MRI [id number] stage I A clinical stage, grade 1-2, Ki-67 43%  Pathology and radiology counseling:Discussed with the patient, the details of pathology including the type of breast cancer,the clinical staging, the significance of ER, PR and HER-2/neu receptors and the implications for treatment. After reviewing the pathology in detail, we proceeded to discuss the different treatment options between surgery, radiation, chemotherapy, antiestrogen therapies.  Recommendation: 1. Lumpectomy followed by 2. Adjuvant chemotherapy with Taxotere, carboplatin, Herceptin every 3 weeks 6 cycles followed by Herceptin maintenance for 1 year; however if the cancer is less than 1 cm, I would plan on giving her Taxol Herceptin weekly 12 followed by maintenance Herceptin for 1 year 3. Followed by adjuvant radiation 4. Followed by oral antiestrogen therapy with tamoxifen  Chemotherapy counseling: I discussed the chemotherapy regimen great detail including the number of cycles as well as the side effects of treatment including nausea vomiting, hair loss, risk of infection, cardiac toxicities related to  Herceptin, neuropathy was switched to Taxotere, fatigue, these changes, decrease in blood counts, fatigue. I will discuss the chemotherapy once again after she completes surgery.   Plan: 1. Surgery with lumpectomy 2. Port placement during surgery 3. She will need to be scheduled for echocardiogram, chemotherapy class subsequent to surgery. 4. Genetics consultation  Return to clinic after surgery for further discussion.     All questions were answered. The patient knows to call the clinic with any problems, questions or concerns.    Rulon Eisenmenger, MD 2:12 PM

## 2014-05-30 NOTE — Progress Notes (Signed)
Received surgical pathology report from [facility]. Sent to scan.

## 2014-05-31 ENCOUNTER — Encounter: Payer: Self-pay | Admitting: *Deleted

## 2014-05-31 NOTE — Progress Notes (Signed)
[place] Psychosocial Distress Screening Clinical Social Work  Clinical Social Work was referred by distress screening protocol.  The patient scored a 9 on the Psychosocial Distress Thermometer which indicates severe distress. Clinical Social Worker phoned pt to assess for distress and other psychosocial needs. Pt shared her work has been less than understanding about needing time off for appointments. 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 Pt reports to have supportive family in the area and transportation to appointments. CSW educated pt on Pt and Family Support Team and additional resources for coping. CSW provided pt with CSW number and pt agrees to reach out as needed.   ONCBCN DISTRESS SCREENING 05/29/2014  Screening Type Initial Screening  Distress experienced in past week (1-10) 9  Practical problem type Work/school  Emotional problem type Nervousness/Anxiety    Clinical Social Worker follow up needed: No.  If yes, follow up plan:  Loren Racer, [place] Phone: [phone] Fax: [phone]

## 2014-06-03 ENCOUNTER — Ambulatory Visit: Payer: 59 | Admitting: Podiatry

## 2014-06-03 ENCOUNTER — Other Ambulatory Visit: Payer: Self-pay

## 2014-06-03 DIAGNOSIS — C50311 Malignant neoplasm of lower-inner quadrant of right female breast: Secondary | ICD-10-CM

## 2014-06-04 ENCOUNTER — Encounter: Payer: Self-pay | Admitting: Hematology and Oncology

## 2014-06-04 ENCOUNTER — Telehealth: Payer: Self-pay | Admitting: Hematology and Oncology

## 2014-06-04 NOTE — Telephone Encounter (Signed)
, °

## 2014-06-04 NOTE — Progress Notes (Signed)
Put fmla form on nurse's desk °

## 2014-06-07 ENCOUNTER — Telehealth: Payer: Self-pay | Admitting: *Deleted

## 2014-06-07 NOTE — Telephone Encounter (Signed)
Left vm for pt to return call to discuss needs and care plan

## 2014-06-10 ENCOUNTER — Telehealth: Payer: Self-pay

## 2014-06-10 ENCOUNTER — Ambulatory Visit: Payer: 59 | Admitting: Podiatry

## 2014-06-10 ENCOUNTER — Encounter: Payer: Self-pay | Admitting: Hematology and Oncology

## 2014-06-10 NOTE — Telephone Encounter (Signed)
FMLA form signed and returned to [facility]

## 2014-06-10 NOTE — Progress Notes (Signed)
Faxed fmla form to [facility] @ [phone]

## 2014-06-11 ENCOUNTER — Telehealth: Payer: Self-pay

## 2014-06-11 NOTE — Telephone Encounter (Signed)
Confirmed d/t ECHO appt.  Pt voiced understanding.

## 2014-06-12 ENCOUNTER — Encounter (HOSPITAL_BASED_OUTPATIENT_CLINIC_OR_DEPARTMENT_OTHER): Payer: Self-pay | Admitting: *Deleted

## 2014-06-12 NOTE — Progress Notes (Signed)
To come in for ekg-bmet after seeds 06/17/14-stopped diet med For echo 06/13/14

## 2014-06-13 ENCOUNTER — Other Ambulatory Visit: Payer: 59

## 2014-06-13 ENCOUNTER — Encounter: Payer: Self-pay | Admitting: *Deleted

## 2014-06-13 ENCOUNTER — Ambulatory Visit (HOSPITAL_BASED_OUTPATIENT_CLINIC_OR_DEPARTMENT_OTHER): Payer: 59 | Admitting: Genetic Counselor

## 2014-06-13 ENCOUNTER — Ambulatory Visit (HOSPITAL_COMMUNITY)
Admission: RE | Admit: 2014-06-13 | Discharge: 2014-06-13 | Disposition: A | Discharge status: Home / Self Care | Payer: 59 | Source: Ambulatory Visit | Attending: Hematology and Oncology | Admitting: Hematology and Oncology

## 2014-06-13 ENCOUNTER — Telehealth: Payer: Self-pay | Admitting: *Deleted

## 2014-06-13 ENCOUNTER — Encounter: Payer: Self-pay | Admitting: Genetic Counselor

## 2014-06-13 ENCOUNTER — Encounter (INDEPENDENT_AMBULATORY_CARE_PROVIDER_SITE_OTHER): Payer: Self-pay

## 2014-06-13 DIAGNOSIS — C50311 Malignant neoplasm of lower-inner quadrant of right female breast: Secondary | ICD-10-CM

## 2014-06-13 DIAGNOSIS — E669 Obesity, unspecified: Secondary | ICD-10-CM | POA: Diagnosis not present

## 2014-06-13 DIAGNOSIS — Z801 Family history of malignant neoplasm of trachea, bronchus and lung: Secondary | ICD-10-CM

## 2014-06-13 DIAGNOSIS — Z8051 Family history of malignant neoplasm of kidney: Secondary | ICD-10-CM

## 2014-06-13 DIAGNOSIS — C50919 Malignant neoplasm of unspecified site of unspecified female breast: Secondary | ICD-10-CM

## 2014-06-13 DIAGNOSIS — Z315 Encounter for genetic counseling: Secondary | ICD-10-CM

## 2014-06-13 DIAGNOSIS — Z01818 Encounter for other preprocedural examination: Secondary | ICD-10-CM | POA: Insufficient documentation

## 2014-06-13 DIAGNOSIS — Z0189 Encounter for other specified special examinations: Secondary | ICD-10-CM

## 2014-06-13 NOTE — Progress Notes (Signed)
  Echocardiogram 2D Echocardiogram has been performed.  Diamond Nickel 06/13/2014, 12:42 PM

## 2014-06-13 NOTE — Progress Notes (Signed)
Patient Name: Krystal Kelley Patient Age: 44 y.o. Encounter Date: 06/13/2014  Referring Physician: Nicholas Lose, MD  Primary Care Provider: Albina Billet, MD   Krystal Kelley, a 44 y.o. female, is being seen at the [facility] due to a personal history of breast cancer. She presents to clinic today with her mother to discuss the possibility of a hereditary predisposition to cancer and discuss whether genetic testing is warranted.  HISTORY OF PRESENT ILLNESS: Krystal Kelley was diagnosed with right breast cancer recently at the age of 22. She stated that she is scheduled already for a lumpectomy and that genetic testing is not going to be used for surgery decisions.      Breast cancer of lower-inner quadrant of right female breast   05/16/2014 Initial Diagnosis Ultrasound-guided biopsy: Invasive ductal carcinoma with DCIS, grade 1-2, ER/PR positive, Ki-67 43%, HER-2 positive   05/28/2014 Breast MRI Right breast: 1.4 x 1.2 x 1 cm oval enhancing mass lower inner quadrant    Past Medical History  Diagnosis Date  . Breast cancer, right breast   . Hypertension     Past Surgical History  Procedure Laterality Date  . Bunion surgrery    . Carpel tunnel    . Breast biopsy  05/16/14  . Cholecystectomy  2015    History   Social History  . Marital Status: Divorced    Spouse Name: N/A  . Number of Children: 0  . Years of Education: N/A   Occupational History  . 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 Smoking status: Never Smoker   . Smokeless tobacco: Never Used  . Alcohol Use: Yes     Comment: occ  . Drug Use: No  . Sexual Activity: Not on file   Other Topics Concern  . Not on file   Social History Narrative     FAMILY HISTORY:   During the visit, a 4-generation pedigree was obtained. Family tree will be sent for scanning and will be in EPIC under the Media tab.  Significant diagnoses include the following:  Family History   Problem Relation Age of Onset  . Kidney cancer Father     currently 62  . Prostate cancer Maternal Uncle     deceased 10  . Lung cancer Maternal Grandfather     deceased 58; smoker  . Colon cancer Paternal Grandfather     deceased 22s  . Cancer Other     pat half-sister; ? uterine vs. ovarian ca    Additionally, Krystal Kelley has no biological children and no full siblings. She has two older maternal half-sisters. She has three paternal half-sisters. Her mother is 36 and cancer-free. Her mother has one sister and 29 brothers; only one with cancer (prostate). Her father has 2 brothers and 3 sisters and there is not much information about them, but it is not thought that they have cancer.  Krystal Kelley ancestry is African American. There is no known Jewish ancestry and no consanguinity.  ASSESSMENT AND PLAN: Krystal Kelley is a 44 y.o. female with a personal history of breast cancer and a family history of possible ovarian or uterine cancer in a paternal half-sister. She is unsure of most of her family history, making a risk assessment challenging. Given her age, genetic testing is recommended and a negative result will be reassuring. We reviewed the characteristics, features and inheritance patterns of hereditary cancer syndromes. We discussed genetic testing, including the process of testing,  insurance coverage and implications of results. She meets [facility]'s criteria for genetic testing.  Krystal Kelley wished to pursue genetic testing and a blood sample will be sent to [facility] for analysis of the 17 genes on the BreastNext gene panel (ATM, BARD1, BRCA1, BRCA2, BRIP1, CDH1, CHEK2, MRE11A, MUTYH, NBN, NF1, PALB2, PTEN, RAD50, RAD51C, RAD51D, and TP53). We discussed the implications of a positive, negative and/ or Variant of Uncertain Significance (VUS) result. Results should be available in approximately 4-5 weeks, at which point we will contact her and address implications for her as well  as address genetic testing for at-risk family members, if needed.    We encouraged Krystal Kelley to remain in contact with Cancer Genetics annually so that we can update the family history and inform her of any changes in cancer genetics and testing that may be of benefit for this family. Ms.  Kelley questions were answered to her satisfaction today.   Thank you for the referral and allowing [place] to share in the care of your patient.   The patient was seen for a total of 30 minutes, greater than 50% of which was spent face-to-face counseling. This patient was discussed with the overseeing provider who agrees with the above.   Steele Berg, MS, [place] Certified Genetic Counselor phone: [phone] [email or web address]

## 2014-06-13 NOTE — Telephone Encounter (Signed)
Received call from pt stating she was stuck in traffic and may be slightly late for her echo. Called echo lab to let them know pt may be a little. Confirmed remainder of appts for today as well.

## 2014-06-17 ENCOUNTER — Encounter (HOSPITAL_BASED_OUTPATIENT_CLINIC_OR_DEPARTMENT_OTHER)
Admission: RE | Admit: 2014-06-17 | Discharge: 2014-06-17 | Disposition: A | Discharge status: Home / Self Care | Payer: 59 | Source: Ambulatory Visit | Attending: General Surgery | Admitting: General Surgery

## 2014-06-17 DIAGNOSIS — C50211 Malignant neoplasm of upper-inner quadrant of right female breast: Secondary | ICD-10-CM | POA: Diagnosis not present

## 2014-06-17 DIAGNOSIS — Z6839 Body mass index (BMI) 39.0-39.9, adult: Secondary | ICD-10-CM | POA: Diagnosis not present

## 2014-06-17 DIAGNOSIS — Z9889 Other specified postprocedural states: Secondary | ICD-10-CM | POA: Diagnosis not present

## 2014-06-17 DIAGNOSIS — Z170 Estrogen receptor positive status [ER+]: Secondary | ICD-10-CM | POA: Diagnosis not present

## 2014-06-17 DIAGNOSIS — C50911 Malignant neoplasm of unspecified site of right female breast: Secondary | ICD-10-CM | POA: Diagnosis present

## 2014-06-17 DIAGNOSIS — Z79899 Other long term (current) drug therapy: Secondary | ICD-10-CM | POA: Diagnosis not present

## 2014-06-17 DIAGNOSIS — I10 Essential (primary) hypertension: Secondary | ICD-10-CM | POA: Diagnosis not present

## 2014-06-17 LAB — BASIC METABOLIC PANEL
BUN: 10 mg/dL (ref 6–23)
Calcium: 9 mg/dL (ref 8.4–10.5)
Chloride: 103 mmol/L (ref 96–112)
GFR calc Af Amer: 89 mL/min — ABNORMAL LOW (ref >90)
GFR calc non Af Amer: 77 mL/min — ABNORMAL LOW (ref >90)
Glucose, Bld: 101 mg/dL — ABNORMAL HIGH (ref 70–99)
Potassium: 4 mmol/L (ref 3.5–5.1)
Sodium: 138 mmol/L (ref 135–145)

## 2014-06-17 LAB — BASIC METABOLIC PANEL WITH GFR
Anion gap: 9 (ref 5–15)
CO2: 26 mmol/L (ref 19–32)
Creatinine, Ser: 0.9 mg/dL (ref 0.50–1.10)

## 2014-06-18 ENCOUNTER — Ambulatory Visit (HOSPITAL_BASED_OUTPATIENT_CLINIC_OR_DEPARTMENT_OTHER): Payer: 59 | Admitting: Anesthesiology

## 2014-06-18 ENCOUNTER — Encounter (HOSPITAL_BASED_OUTPATIENT_CLINIC_OR_DEPARTMENT_OTHER): Payer: Self-pay | Admitting: *Deleted

## 2014-06-18 ENCOUNTER — Ambulatory Visit (HOSPITAL_BASED_OUTPATIENT_CLINIC_OR_DEPARTMENT_OTHER)
Admission: RE | Admit: 2014-06-18 | Discharge: 2014-06-18 | Disposition: A | Discharge status: Home / Self Care | Payer: 59 | Source: Ambulatory Visit | Attending: General Surgery | Admitting: General Surgery

## 2014-06-18 ENCOUNTER — Ambulatory Visit (HOSPITAL_COMMUNITY)
Admission: RE | Admit: 2014-06-18 | Discharge: 2014-06-18 | Disposition: A | Discharge status: Home / Self Care | Payer: 59 | Source: Ambulatory Visit | Attending: General Surgery | Admitting: General Surgery

## 2014-06-18 ENCOUNTER — Ambulatory Visit (HOSPITAL_COMMUNITY): Payer: 59

## 2014-06-18 ENCOUNTER — Encounter (HOSPITAL_BASED_OUTPATIENT_CLINIC_OR_DEPARTMENT_OTHER)
Admission: RE | Disposition: A | Discharge status: Home / Self Care | Payer: Self-pay | Source: Ambulatory Visit | Attending: General Surgery

## 2014-06-18 DIAGNOSIS — C50211 Malignant neoplasm of upper-inner quadrant of right female breast: Secondary | ICD-10-CM | POA: Diagnosis not present

## 2014-06-18 DIAGNOSIS — C50911 Malignant neoplasm of unspecified site of right female breast: Secondary | ICD-10-CM

## 2014-06-18 DIAGNOSIS — Z170 Estrogen receptor positive status [ER+]: Secondary | ICD-10-CM | POA: Insufficient documentation

## 2014-06-18 DIAGNOSIS — I10 Essential (primary) hypertension: Secondary | ICD-10-CM | POA: Insufficient documentation

## 2014-06-18 DIAGNOSIS — Z9889 Other specified postprocedural states: Secondary | ICD-10-CM | POA: Insufficient documentation

## 2014-06-18 DIAGNOSIS — Z95828 Presence of other vascular implants and grafts: Secondary | ICD-10-CM

## 2014-06-18 DIAGNOSIS — C50311 Malignant neoplasm of lower-inner quadrant of right female breast: Secondary | ICD-10-CM

## 2014-06-18 DIAGNOSIS — Z79899 Other long term (current) drug therapy: Secondary | ICD-10-CM | POA: Insufficient documentation

## 2014-06-18 DIAGNOSIS — Z6839 Body mass index (BMI) 39.0-39.9, adult: Secondary | ICD-10-CM | POA: Insufficient documentation

## 2014-06-18 DIAGNOSIS — Z419 Encounter for procedure for purposes other than remedying health state, unspecified: Secondary | ICD-10-CM

## 2014-06-18 HISTORY — PX: BREAST LUMPECTOMY: SHX2

## 2014-06-18 HISTORY — PX: PORTACATH PLACEMENT: SHX2246

## 2014-06-18 LAB — POCT HEMOGLOBIN-HEMACUE: Hemoglobin: 12.9 g/dL (ref 12.0–15.0)

## 2014-06-18 SURGERY — BREAST LUMPECTOMY WITH RADIOACTIVE SEED AND SENTINEL LYMPH NODE BIOPSY
Anesthesia: Regional | Site: Chest | Laterality: Right

## 2014-06-18 MED ORDER — FENTANYL CITRATE 0.05 MG/ML IJ SOLN
50.0000 ug | INTRAMUSCULAR | Status: DC | PRN
  Administered 2014-06-18: 100 ug via INTRAVENOUS
  Administered 2014-06-18: 50 ug via INTRAVENOUS

## 2014-06-18 MED ORDER — CEFAZOLIN SODIUM-DEXTROSE 2-3 GM-% IV SOLR
INTRAVENOUS | Status: AC
  Filled 2014-06-18: qty 50

## 2014-06-18 MED ORDER — OXYCODONE HCL 5 MG/5ML PO SOLN
5.0000 mg | Freq: Once | ORAL | Status: AC | PRN
Start: 2014-06-18 — End: 2014-06-18

## 2014-06-18 MED ORDER — FENTANYL CITRATE 0.05 MG/ML IJ SOLN
INTRAMUSCULAR | Status: AC
  Filled 2014-06-18: qty 2

## 2014-06-18 MED ORDER — MIDAZOLAM HCL 2 MG/2ML IJ SOLN
INTRAMUSCULAR | Status: AC
  Filled 2014-06-18: qty 2

## 2014-06-18 MED ORDER — OXYCODONE HCL 5 MG PO TABS
ORAL_TABLET | ORAL | Status: AC
  Filled 2014-06-18: qty 1

## 2014-06-18 MED ORDER — ONDANSETRON HCL 4 MG/2ML IJ SOLN
INTRAMUSCULAR | Status: DC | PRN
  Administered 2014-06-18: 4 mg via INTRAVENOUS

## 2014-06-18 MED ORDER — HEPARIN (PORCINE) IN NACL 2-0.9 UNIT/ML-% IJ SOLN
INTRAMUSCULAR | Status: AC
  Filled 2014-06-18: qty 500

## 2014-06-18 MED ORDER — HEPARIN (PORCINE) IN NACL 2-0.9 UNIT/ML-% IJ SOLN
INTRAMUSCULAR | Status: DC | PRN
  Administered 2014-06-18: 1 via INTRAVENOUS

## 2014-06-18 MED ORDER — CEFAZOLIN SODIUM-DEXTROSE 2-3 GM-% IV SOLR
2.0000 g | INTRAVENOUS | Status: AC
  Administered 2014-06-18: 2 g via INTRAVENOUS

## 2014-06-18 MED ORDER — TECHNETIUM TC 99M SULFUR COLLOID FILTERED: 1.0000 | Freq: Once | INTRAVENOUS | Status: AC | PRN

## 2014-06-18 MED ORDER — HEPARIN SOD (PORK) LOCK FLUSH 100 UNIT/ML IV SOLN
INTRAVENOUS | Status: AC
  Filled 2014-06-18: qty 5

## 2014-06-18 MED ORDER — CHLORHEXIDINE GLUCONATE 4 % EX LIQD: 1.0000 "application " | Freq: Once | CUTANEOUS | Status: DC

## 2014-06-18 MED ORDER — EPHEDRINE SULFATE 50 MG/ML IJ SOLN
INTRAMUSCULAR | Status: DC | PRN
  Administered 2014-06-18 (×2): 10 mg via INTRAVENOUS

## 2014-06-18 MED ORDER — OXYCODONE-ACETAMINOPHEN 5-325 MG PO TABS: 1.0000 | ORAL_TABLET | ORAL | Status: DC | PRN

## 2014-06-18 MED ORDER — FENTANYL CITRATE 0.05 MG/ML IJ SOLN
INTRAMUSCULAR | Status: AC
  Filled 2014-06-18: qty 6

## 2014-06-18 MED ORDER — LACTATED RINGERS IV SOLN
INTRAVENOUS | Status: DC
  Administered 2014-06-18 (×2): via INTRAVENOUS

## 2014-06-18 MED ORDER — BUPIVACAINE-EPINEPHRINE 0.25% -1:200000 IJ SOLN
INTRAMUSCULAR | Status: DC | PRN
  Administered 2014-06-18: 8 mL
  Administered 2014-06-18: 13 mL

## 2014-06-18 MED ORDER — BUPIVACAINE-EPINEPHRINE (PF) 0.5% -1:200000 IJ SOLN
INTRAMUSCULAR | Status: DC | PRN
  Administered 2014-06-18: 30 mL via PERINEURAL

## 2014-06-18 MED ORDER — LIDOCAINE HCL (CARDIAC) 20 MG/ML IV SOLN
INTRAVENOUS | Status: DC | PRN
  Administered 2014-06-18: 30 mg via INTRAVENOUS

## 2014-06-18 MED ORDER — MIDAZOLAM HCL 2 MG/2ML IJ SOLN
1.0000 mg | INTRAMUSCULAR | Status: DC | PRN
  Administered 2014-06-18: 2 mg via INTRAVENOUS
  Administered 2014-06-18: 1 mg via INTRAVENOUS

## 2014-06-18 MED ORDER — PROPOFOL 10 MG/ML IV BOLUS
INTRAVENOUS | Status: DC | PRN
  Administered 2014-06-18: 200 mg via INTRAVENOUS

## 2014-06-18 MED ORDER — SODIUM CHLORIDE 0.9 % IJ SOLN
INTRAMUSCULAR | Status: DC | PRN
  Administered 2014-06-18: 13:00:00

## 2014-06-18 MED ORDER — DEXAMETHASONE SODIUM PHOSPHATE 4 MG/ML IJ SOLN
INTRAMUSCULAR | Status: DC | PRN
  Administered 2014-06-18: 10 mg via INTRAVENOUS

## 2014-06-18 MED ORDER — ONDANSETRON HCL 4 MG/2ML IJ SOLN: 4.0000 mg | Freq: Four times a day (QID) | INTRAMUSCULAR | Status: DC | PRN

## 2014-06-18 MED ORDER — MIDAZOLAM HCL 5 MG/5ML IJ SOLN
INTRAMUSCULAR | Status: DC | PRN
  Administered 2014-06-18: 2 mg via INTRAVENOUS

## 2014-06-18 MED ORDER — HYDROMORPHONE HCL 1 MG/ML IJ SOLN: 0.2500 mg | INTRAMUSCULAR | Status: DC | PRN

## 2014-06-18 MED ORDER — OXYCODONE HCL 5 MG PO TABS
5.0000 mg | ORAL_TABLET | Freq: Once | ORAL | Status: AC | PRN
  Administered 2014-06-18: 5 mg via ORAL

## 2014-06-18 MED ORDER — FENTANYL CITRATE 0.05 MG/ML IJ SOLN
INTRAMUSCULAR | Status: DC | PRN
  Administered 2014-06-18 (×2): 25 ug via INTRAVENOUS
  Administered 2014-06-18 (×2): 50 ug via INTRAVENOUS
  Administered 2014-06-18: 25 ug via INTRAVENOUS

## 2014-06-18 SURGICAL SUPPLY — 72 items
APPLIER CLIP 9.375 MED OPEN (MISCELLANEOUS) ×3
APR CLP MED 9.3 20 MLT OPN (MISCELLANEOUS) ×2
BAG DECANTER FOR FLEXI CONT (MISCELLANEOUS) ×3 IMPLANT
BANDAGE ADH SHEER 1  50/CT (GAUZE/BANDAGES/DRESSINGS) ×3 IMPLANT
BINDER BREAST 3XL (BIND) ×1 IMPLANT
BINDER BREAST LRG (GAUZE/BANDAGES/DRESSINGS) IMPLANT
BINDER BREAST MEDIUM (GAUZE/BANDAGES/DRESSINGS) IMPLANT
BINDER BREAST XLRG (GAUZE/BANDAGES/DRESSINGS) IMPLANT
BINDER BREAST XXLRG (GAUZE/BANDAGES/DRESSINGS) IMPLANT
BLADE SURG 15 STRL LF DISP TIS (BLADE) ×2 IMPLANT
BLADE SURG 15 STRL SS (BLADE) ×3
CANISTER SUC SOCK COL 7IN (MISCELLANEOUS) IMPLANT
CANISTER SUCT 1200ML W/VALVE (MISCELLANEOUS) IMPLANT
CHLORAPREP W/TINT 26ML (MISCELLANEOUS) ×3 IMPLANT
CLIP APPLIE 9.375 MED OPEN (MISCELLANEOUS) ×2 IMPLANT
COVER BACK TABLE 60X90IN (DRAPES) ×3 IMPLANT
COVER MAYO STAND STRL (DRAPES) ×4 IMPLANT
COVER PROBE W GEL 5X96 (DRAPES) ×3 IMPLANT
DECANTER SPIKE VIAL GLASS SM (MISCELLANEOUS) IMPLANT
DEVICE DUBIN W/COMP PLATE 8390 (MISCELLANEOUS) ×3 IMPLANT
DRAPE C-ARM 42X72 X-RAY (DRAPES) ×3 IMPLANT
DRAPE LAPAROSCOPIC ABDOMINAL (DRAPES) ×3 IMPLANT
DRAPE LAPAROTOMY T 102X78X121 (DRAPES) ×2 IMPLANT
DRAPE UTILITY XL STRL (DRAPES) ×3 IMPLANT
ELECT COATED BLADE 2.86 ST (ELECTRODE) ×3 IMPLANT
ELECT REM PT RETURN 9FT ADLT (ELECTROSURGICAL) ×3
ELECTRODE REM PT RTRN 9FT ADLT (ELECTROSURGICAL) ×2 IMPLANT
GLOVE BIO SURGEON STRL SZ 6.5 (GLOVE) ×1 IMPLANT
GLOVE BIOGEL PI IND STRL 6.5 (GLOVE) IMPLANT
GLOVE BIOGEL PI IND STRL 7.5 (GLOVE) IMPLANT
GLOVE BIOGEL PI IND STRL 8 (GLOVE) ×2 IMPLANT
GLOVE BIOGEL PI INDICATOR 6.5 (GLOVE) ×1
GLOVE BIOGEL PI INDICATOR 7.5 (GLOVE) ×1
GLOVE BIOGEL PI INDICATOR 8 (GLOVE) ×1
GLOVE ECLIPSE 6.5 STRL STRAW (GLOVE) ×3 IMPLANT
GLOVE ECLIPSE 7.5 STRL STRAW (GLOVE) ×5 IMPLANT
GLOVE SURG SS PI 7.0 STRL IVOR (GLOVE) ×1 IMPLANT
GOWN STRL REUS W/ TWL LRG LVL3 (GOWN DISPOSABLE) ×2 IMPLANT
GOWN STRL REUS W/ TWL XL LVL3 (GOWN DISPOSABLE) ×2 IMPLANT
GOWN STRL REUS W/TWL LRG LVL3 (GOWN DISPOSABLE) ×9
GOWN STRL REUS W/TWL XL LVL3 (GOWN DISPOSABLE) ×6
IV CATH PLACEMENT UNIT 16 GA (IV SOLUTION) IMPLANT
IV KIT MINILOC 20X1 SAFETY (NEEDLE) IMPLANT
KIT MARKER MARGIN INK (KITS) ×3 IMPLANT
KIT PORT POWER 8FR ISP CVUE (Catheter) ×3 IMPLANT
LIQUID BAND (GAUZE/BANDAGES/DRESSINGS) ×4 IMPLANT
NDL HYPO 25X1 1.5 SAFETY (NEEDLE) ×2 IMPLANT
NDL SAFETY ECLIPSE 18X1.5 (NEEDLE) IMPLANT
NEEDLE HYPO 18GX1.5 SHARP (NEEDLE) ×3
NEEDLE HYPO 22GX1.5 SAFETY (NEEDLE) ×3 IMPLANT
NEEDLE HYPO 25X1 1.5 SAFETY (NEEDLE) ×3 IMPLANT
NS IRRIG 1000ML POUR BTL (IV SOLUTION) IMPLANT
PACK BASIN DAY SURGERY FS (CUSTOM PROCEDURE TRAY) ×3 IMPLANT
PENCIL BUTTON HOLSTER BLD 10FT (ELECTRODE) ×3 IMPLANT
SET SHEATH INTRODUCER 10FR (MISCELLANEOUS) IMPLANT
SHEATH COOK PEEL AWAY SET 9F (SHEATH) IMPLANT
SLEEVE SCD COMPRESS KNEE MED (MISCELLANEOUS) ×3 IMPLANT
SPONGE LAP 4X18 X RAY DECT (DISPOSABLE) ×3 IMPLANT
SUT MNCRL AB 4-0 PS2 18 (SUTURE) ×3 IMPLANT
SUT MON AB 4-0 PC3 18 (SUTURE) ×3 IMPLANT
SUT PROLENE 2 0 CT2 30 (SUTURE) ×3 IMPLANT
SUT SILK 2 0 SH (SUTURE) IMPLANT
SUT SILK 4 0 TIES 17X18 (SUTURE) IMPLANT
SUT VIC AB 3-0 SH 27 (SUTURE) ×3
SUT VIC AB 3-0 SH 27X BRD (SUTURE) ×2 IMPLANT
SUT VICRYL 3-0 CR8 SH (SUTURE) IMPLANT
SYR 5ML LUER SLIP (SYRINGE) ×3 IMPLANT
SYR CONTROL 10ML LL (SYRINGE) ×4 IMPLANT
TOWEL OR 17X24 6PK STRL BLUE (TOWEL DISPOSABLE) ×6 IMPLANT
TOWEL OR NON WOVEN STRL DISP B (DISPOSABLE) ×3 IMPLANT
TUBE CONNECTING 20X1/4 (TUBING) IMPLANT
YANKAUER SUCT BULB TIP NO VENT (SUCTIONS) IMPLANT

## 2014-06-18 NOTE — Progress Notes (Signed)
  Assisted Dr. Hodierne with right, ultrasound guided, pectoralis block. Side rails up, monitors on throughout procedure. See vital signs in flow sheet. Tolerated Procedure well. 

## 2014-06-18 NOTE — Transfer of Care (Signed)
Immediate Anesthesia Transfer of Care Note  Patient: Krystal Kelley  Procedure(s) Performed: Procedure(s): RIGHT BREAST LUMPECTOMY WITH RADIOACTIVE SEED AND RIGHT AXILLARY SENTINEL LYMPH NODE BIOPSY (Right) INSERTION PORT-A-CATH (Left)  Patient Location: PACU  Anesthesia Type:GA combined with regional for post-op pain  Level of Consciousness: awake and patient cooperative  Airway & Oxygen Therapy: Patient Spontanous Breathing and Patient connected to face mask oxygen  Post-op Assessment: Report given to RN and Post -op Vital signs reviewed and stable  Post vital signs: Reviewed and stable  Last Vitals:  Filed Vitals:   06/18/14 1205  BP:   Pulse: 96  Temp:   Resp:     Complications: No apparent anesthesia complications

## 2014-06-18 NOTE — Anesthesia Procedure Notes (Addendum)
Procedure Name: LMA Insertion Date/Time: 06/18/2014 12:32 PM Performed by: BLOCKER, TIMOTHY Pre-anesthesia Checklist: Patient identified, Emergency Drugs available, Suction available and Patient being monitored Patient Re-evaluated:Patient Re-evaluated prior to inductionOxygen Delivery Method: Circle System Utilized Preoxygenation: Pre-oxygenation with 100% oxygen Intubation Type: IV induction Ventilation: Mask ventilation without difficulty LMA: LMA inserted LMA Size: 4.0 Number of attempts: 1 Airway Equipment and Method: Bite block Placement Confirmation: positive ETCO2 Tube secured with: Tape Dental Injury: Teeth and Oropharynx as per pre-operative assessment    Anesthesia Regional Block:  Pectoralis block  Pre-Anesthetic Checklist: ,, timeout performed, Correct Patient, Correct Site, Correct Laterality, Correct Procedure, Correct Position, site marked, Risks and benefits discussed,  Surgical consent,  Pre-op evaluation,  At surgeon's request and post-op pain management  Laterality: Right  Prep: chloraprep       Needles:  Injection technique: Single-shot  Needle Type: Echogenic Needle     Needle Length: 9cm 9 cm Needle Gauge: 21 and 21 G    Additional Needles:  Procedures: ultrasound guided (picture in chart) Pectoralis block Narrative:  Start time: 06/18/2014 11:42 AM End time: 06/18/2014 11:55 AM Injection made incrementally with aspirations every 5 mL. Anesthesiologist: Tresea Heine  Additional Notes: Pt tolerated the procedure well.

## 2014-06-18 NOTE — Anesthesia Preprocedure Evaluation (Signed)
Anesthesia Evaluation  Patient identified by MRN, date of birth, ID band Patient awake    Reviewed: Allergy & Precautions, NPO status , Patient's Chart, lab work & pertinent test results  Airway Mallampati: II   Neck ROM: full    Dental   Pulmonary neg pulmonary ROS,          Cardiovascular hypertension,     Neuro/Psych    GI/Hepatic   Endo/Other  Morbid obesity  Renal/GU      Musculoskeletal   Abdominal   Peds  Hematology   Anesthesia Other Findings   Reproductive/Obstetrics                             Anesthesia Physical Anesthesia Plan  ASA: II  Anesthesia Plan: General and Regional   Post-op Pain Management: MAC Combined w/ Regional for Post-op pain   Induction: Intravenous  Airway Management Planned: LMA  Additional Equipment:   Intra-op Plan:   Post-operative Plan:   Informed Consent: I have reviewed the patients History and Physical, chart, labs and discussed the procedure including the risks, benefits and alternatives for the proposed anesthesia with the patient or authorized representative who has indicated his/her understanding and acceptance.     Plan Discussed with: CRNA, Anesthesiologist and Surgeon  Anesthesia Plan Comments:         Anesthesia Quick Evaluation

## 2014-06-18 NOTE — Op Note (Signed)
Preoperative Diagnosis: cancer right breast  Postoprative Diagnosis: cancer right breast  Procedure: Procedure(s): BLUE DYE INJECTION RIGHT BREAST, RIGHT BREAST LUMPECTOMY WITH RADIOACTIVE SEED AND RIGHT AXILLARY SENTINEL LYMPH NODE BIOPSY INSERTION PORT-A-CATH   Surgeon: Excell Seltzer T   Assistants: None  Anesthesia:  General LMA anesthesia  Indications: Patient is a 44 year old female with a recent diagnosis of stage I, T1a, N0 invasive ductal carcinoma of the right breast, triple positive. She will require IV access for Herceptin. We have discussed options for initial surgical therapy and risks detailed extensively elsewhere and have elected to proceed with right breast lumpectomy, right axillary sentinel lymph node biopsy and Port-A-Cath placement as her initial surgical therapy.    Procedure Detail:  Patient underwent accurate placement of radioactive seed in the right breast yesterday. In the holding area today she underwent injection of 1 mCi of technetium sulfur colloid intradermally around the right nipple. I confirmed seed placement in the holding area with the neoprobe. She was then taken to the operating room, placed in supine position on the operating table, and laryngeal mask general anesthesia induced. Under sterile technique after patient timeout I injected 5 mL of dilute methylene blue subcutaneously beneath the right nipple and massaged this for several minutes. She had been placed with her left arm tucked for Port-A-Cath placement on the left and the right arm extended. The entire chest and neck and right axilla and upper arm were widely sterilely prepped and draped. She received preoperative IV antibiotics. PAS were in place. Patient timeout was performed and correct procedure verified. The lumpectomy was approached initially. There was a small palpable mass in this did correspond To the hot area with the neoprobe. This was very superficial and I took an ellipse of skin  overlying the mass and then the dissection was deepened down into the subcutaneous tissue and down into the breast tissue circumferentially staying well away from the mass and area of high counts in total and was deep to the mass in the area of high counts and we came across the base of the specimen it was excised. Ex vivo the clip was confirmed to be in the specimen. The specimen was inked for margins. Specimen mammogram showed the radioactive seed and marking clip within the specimen although oriented a little more toward the superior edge. This was sent for permanent pathology. I then reexcised the superio- anterior Margin taking a little skin superiorly and carrying this dissection down through the subcutaneous tissue and breast tissue of the superior margin as well. This was oriented with suture and sent for permanent pathology. The skin was infiltrated with Marcaine. Hemostasis was assured. The lumpectomy cavity was marked with clips. The breast tissue and subtenon's tissue was closed with interrupted 3-0 Vicryl. Attention was turned to the sentinel lymph node biopsy. A hot area in the right axilla was localized and small transverse incision made. Dissection was carried down through subcutaneous tissue with cautery and the clavipectoral fascia was bluntly opened. Using the neoprobe for guide I dissected down onto a slightly enlarged but soft lymph node with high counts and blue dye. This was completely excised with cautery and ex vivo had counts of greater than 700 and I could not obtain any significant counts after this in the axilla and there were no palpable abnormalities. This was sent as hot blue axillary sentinel lymph node. The skin was infiltrated with Marcaine. The deep and subcutaneous tissues tissue was closed with interrupted 3-0 Vicryl and the skin incisions of  the breast and axilla were closed with subcuticular 4-0 Monocryl and Dermabond. At this point all instruments and gloves and gowns were  changed for the Port-A-Cath. The port site and infraclavicular area on the left were infiltrated with local anesthesia. The left subclavian vein was then cannulated with a needle and guidewire without difficulty and the guidewire confirmed in the superior vena cava. This incision was enlarged slightly and the introducer was placed over the guidewire which was removed and the flush catheter was placed via the introducer which was stripped away. The tip of the catheter was confirmed in the superior vena cava and it was positioned at the cavoatrial junction. A small transverse incision was made on the left anterior chest wall and subcutaneous pocket created. The catheter was tunneled subcutaneously to the pocket, trimmed to length and attached to the port which was sutured to the anterior chest wall with interrupted 2-0 Prolene. Fluoroscopy again showed good position of the catheter. The subcutaneous tissue was closed with interrupted 4-0 Monocryl and the skin with subcuticular 4-0 Monocryl and Dermabond. The port was accessed and flushed and aspirated easily and was left flushed with concentrated heparin solution. Sponge needle and instrument counts were correct.    Findings: As above  Estimated Blood Loss:  Minimal         Drains: none  Blood Given: none          Specimens: #1 right breast lumpectomy       #2 further superio-anterior margin      #3 hot blue right axillary sentinel lymph node        Complications:  * No complications entered in OR log *         Disposition: PACU - hemodynamically stable.         Condition: stable

## 2014-06-18 NOTE — H&P (Signed)
History of Present Illness [name] T. Toribio Seiber MD; 05/22/2014 4:06 PM) Patient words: eval right breast.  The patient is a 44 year old female who presents with breast cancer. She is a premenopausal female referred by Dr. Marcelo Baldy for evaluation of recently diagnosed carcinoma of the right breast. She recently was able to feel a lump near the skin in the medial aspect of her right breast. Subsequent imaging included diagnostic mamogram showing a 1.5 cm mass with indistinct margins at the 3 o'clock position of the right breast anterior depth and ultrasound showing an irregular hypoechoic mass at the 3 o'clock position measuring 1.1 x 1.3 cm corresponding to the mammogram findings. An ultrasound guided breast biopsy was performed on May 16, 2014 with pathology revealing invasive ductal carcinoma and ductal carcinoma in situ, grade 1-2, ER and PR positive with a Ki-67 of 43% and HER-2 positive. She is seen now in the office for initial treatment planning. She has experienced a lump but no pain or nipple discharge or skin changes. She does not have a personal history of any previous breast problems. She denies any family history of breast cancer Findings at that time were the following: Tumor size: 1.3 cm Tumor grade: 2 Estrogen Receptor: Positive Progesterone Receptor: Positive Her-2 neu: Positive Lymph node status: Negative   Other Problems Davy Pique Bynum, CMA; 05/22/2014 3:03 PM) Other disease, cancer, significant illness  Past Surgical History Marjean Donna, [place]; 05/22/2014 3:03 PM) Breast Biopsy Right. Foot Surgery Bilateral. Gallbladder Surgery - Laparoscopic  Diagnostic Studies History Marjean Donna, CMA; 05/22/2014 3:03 PM) Colonoscopy never Mammogram within last year Pap Smear 1-5 years ago  Allergies Marjean Donna, CMA; 05/22/2014 3:04 PM) No Known Drug Allergies02/06/2014  Medication History (Sonya Bynum, CMA; 05/22/2014 3:05 PM) Phentermine HCl (37.5MG Tablet, Oral)  Active. Junel 1/20 (1-20MG-MCG Tablet, Oral) Active. Hydrochlorothiazide (25MG Tablet, Oral) Active.  Social History Marjean Donna, [place]; 05/22/2014 3:03 PM) Alcohol use Remotely quit alcohol use. Caffeine use Carbonated beverages, Tea. No drug use Tobacco use Never smoker.  Family History Marjean Donna, CMA; 05/22/2014 3:03 PM) Colon Polyps Father. Diabetes Mellitus Mother. Heart disease in female family member before age 42 Hypertension Mother, Sister.  Pregnancy / Birth History Marjean Donna, [place]; 05/22/2014 3:03 PM) Age at menarche 58 years. Contraceptive History Oral contraceptives. Gravida 0 Para 0  Review of Systems (Sonya Bynum CMA; 05/22/2014 3:03 PM) General Not Present- Appetite Loss, Chills, Fatigue, Fever, Night Sweats, Weight Gain and Weight Loss. Skin Not Present- Change in Wart/Mole, Dryness, Hives, Jaundice, New Lesions, Non-Healing Wounds, Rash and Ulcer. HEENT Not Present- Earache, Hearing Loss, Hoarseness, Nose Bleed, Oral Ulcers, Ringing in the Ears, Seasonal Allergies, Sinus Pain, Sore Throat, Visual Disturbances, Wears glasses/contact lenses and Yellow Eyes. Respiratory Not Present- Bloody sputum, Chronic Cough, Difficulty Breathing, Snoring and Wheezing. Breast Present- Breast Mass. Not Present- Breast Pain, Nipple Discharge and Skin Changes. Cardiovascular Not Present- Chest Pain, Difficulty Breathing Lying Down, Leg Cramps, Palpitations, Rapid Heart Rate, Shortness of Breath and Swelling of Extremities. Gastrointestinal Not Present- Abdominal Pain, Bloating, Bloody Stool, Change in Bowel Habits, Chronic diarrhea, Constipation, Difficulty Swallowing, Excessive gas, Gets full quickly at meals, Hemorrhoids, Indigestion, Nausea, Rectal Pain and Vomiting. Female Genitourinary Not Present- Frequency, Nocturia, Painful Urination, Pelvic Pain and Urgency. Musculoskeletal Present- Joint Stiffness. Not Present- Back Pain, Joint Pain, Muscle Pain, Muscle Weakness and  Swelling of Extremities. Neurological Present- Numbness and Tingling. Not Present- Decreased Memory, Fainting, Headaches, Seizures, Tremor, Trouble walking and Weakness. Psychiatric Not Present- Anxiety, Bipolar, Change in Sleep Pattern,  Depression, Fearful and Frequent crying. Endocrine Not Present- Cold Intolerance, Excessive Hunger, Hair Changes, Heat Intolerance, Hot flashes and New Diabetes. Hematology Not Present- Easy Bruising, Excessive bleeding, Gland problems, HIV and Persistent Infections.   Vitals (Sonya Bynum CMA; 05/22/2014 3:04 PM) 05/22/2014 3:04 PM Weight: 230 lb Height: 65in Body Surface Area: 2.19 m Body Mass Index: 38.27 kg/m Temp.: 42F(Temporal)  Pulse: 79 (Regular)  BP: 126/76 (Sitting, Left Arm, Standard)    Physical Exam [name] T. Sinahi Knights MD; 05/22/2014 4:08 PM) The physical exam findings are as follows: Note:General: Alert, moderately obese African-American female, in no distress Skin: Warm and dry without rash or infection. HEENT: No palpable masses or thyromegaly. Sclera nonicteric. Pupils equal round and reactive. Oropharynx clear. Lymph nodes: No cervical, supraclavicular, or inguinal nodes palpable. Breasts: In the medial aspect of the right breast 3 o'clock position very anterior is a firm palpable approximately 1 cm mass. No skin changes. No palpable axillary adenopathy. Lungs: Breath sounds clear and equal. No wheezing or increased work of breathing. Cardiovascular: Regular rate and rhythm without murmer. No JVD or edema. Peripheral pulses intact. Extremities: No edema or joint swelling or deformity. No chronic venous stasis changes. Neurologic: Alert and fully oriented. Gait normal. No focal weakness. Psychiatric: Normal mood and affect. Thought content appropriate with normal judgement and insight    Assessment & Plan [name] T. Rosey Eide MD; 05/22/2014 4:13 PM) BREAST CANCER, RIGHT (174.9  C50.911) Impression: 44 year old female with  a new diagnosis of cancer of the right breast, upper inner quadrant. Clinical stage 1, T1a, N0, triple positive. I discussed with the patient initial surgical treatment options. We discussed options of breast conservation with lumpectomy or total mastectomy and sentinal lymph node biopsy/dissection. Options for reconstruction were discussed. After discussion they have elected to proceed with breast conservation with lumpectomy and axillary sentinel lymph node biopsy.. We discussed the indications and nature of the procedure, and expected recovery, in detail. Surgical risks including anesthetic complications, cardiorespiratory complications, bleeding, infection, wound healing complications, blood clots, lymphedema, local and distant recurrence and possible need for further surgery based on the final pathology was discussed and understood. Chemotherapy, hormonal therapy and radiation therapy have been discussed. We discussed placement of Port-A-Cath as she likely will have Herceptin therapy. They have been provided with literature regarding the treatment of breast cancer. All questions were answered. They understand and agree to proceed. I will await her MRI and medical oncology evaluations prior to scheduling. Current Plans  Referred to Oncology, for evaluation and follow up (Oncology). Referred to Radiation Oncology, for evaluation and follow up (Radiation Oncology). Schedule for Surgery: Tentatively planning radioactive seed localized right breast lumpectomy and right axillary sentinel lymph node biopsy with Port-A-Cath placement pending results of MRI and medical oncology evaluation

## 2014-06-18 NOTE — Discharge Instructions (Signed)
[place] Office Phone Number [phone]  BREAST BIOPSY/ PARTIAL MASTECTOMY: POST OP INSTRUCTIONS  Always review your discharge instruction sheet given to you by the facility where your surgery was performed.  IF YOU HAVE DISABILITY OR FAMILY LEAVE FORMS, YOU MUST BRING THEM TO THE OFFICE FOR PROCESSING.  DO NOT GIVE THEM TO YOUR DOCTOR.  1. A prescription for pain medication may be given to you upon discharge.  Take your pain medication as prescribed, if needed.  If narcotic pain medicine is not needed, then you may take acetaminophen (Tylenol) or ibuprofen (Advil) as needed. 2. Take your usually prescribed medications unless otherwise directed 3. If you need a refill on your pain medication, please contact your pharmacy.  They will contact our office to request authorization.  Prescriptions will not be filled after 5pm or on week-ends. 4. You should eat very light the first 24 hours after surgery, such as soup, crackers, pudding, etc.  Resume your normal diet the day after surgery. 5. Most patients will experience some swelling and bruising in the breast.  Ice packs and a good support bra will help.  Swelling and bruising can take several days to resolve.  6. It is common to experience some constipation if taking pain medication after surgery.  Increasing fluid intake and taking a stool softener will usually help or prevent this problem from occurring.  A mild laxative (Milk of Magnesia or Miralax) should be taken according to package directions if there are no bowel movements after 48 hours. 7. Unless discharge instructions indicate otherwise, you may remove your bandages 24-48 hours after surgery, and you may shower at that time.  You may have steri-strips (small skin tapes) in place directly over the incision.  These strips should be left on the skin for 7-10 days.  If your surgeon used skin glue on the incision, you may shower in 24 hours.  The glue will flake off over the  next 2-3 weeks.  Any sutures or staples will be removed at the office during your follow-up visit. 8. ACTIVITIES:  You may resume regular daily activities (gradually increasing) beginning the next day.  Wearing a good support bra or sports bra minimizes pain and swelling.  You may have sexual intercourse when it is comfortable. a. You may drive when you no longer are taking prescription pain medication, you can comfortably wear a seatbelt, and you can safely maneuver your car and apply brakes. b. RETURN TO WORK:  ______________________________________________________________________________________ 9. You should see your doctor in the office for a follow-up appointment approximately two weeks after your surgery.  Your doctors nurse will typically make your follow-up appointment when she calls you with your pathology report.  Expect your pathology report 2-3 business days after your surgery.  You may call to check if you do not hear from [place] after three days. 10. OTHER INSTRUCTIONS: _______________________________________________________________________________________________ _____________________________________________________________________________________________________________________________________ _____________________________________________________________________________________________________________________________________ _____________________________________________________________________________________________________________________________________  WHEN TO CALL YOUR DOCTOR: 1. Fever over 101.0 2. Nausea and/or vomiting. 3. Extreme swelling or bruising. 4. Continued bleeding from incision. 5. Increased pain, redness, or drainage from the incision.  The clinic staff is available to answer your questions during regular business hours.  Please dont hesitate to call and ask to speak to one of the nurses for clinical concerns.  If you have a medical emergency, go to the nearest  emergency room or call 911.  A surgeon from [facility] is always on call at the hospital.  For further questions, please visit [email or web address]  [place] Office Phone Number [phone]  BREAST BIOPSY/ PARTIAL MASTECTOMY: POST OP INSTRUCTIONS  Always review your discharge instruction sheet given to you by the facility where your surgery was performed.  IF YOU HAVE DISABILITY OR FAMILY LEAVE FORMS, YOU MUST BRING THEM TO THE OFFICE FOR PROCESSING.  DO NOT GIVE THEM TO YOUR DOCTOR.  11. A prescription for pain medication may be given to you upon discharge.  Take your pain medication as prescribed, if needed.  If narcotic pain medicine is not needed, then you may take acetaminophen (Tylenol) or ibuprofen (Advil) as needed. 12. Take your usually prescribed medications unless otherwise directed 13. If you need a refill on your pain medication, please contact your pharmacy.  They will contact our office to request authorization.  Prescriptions will not be filled after 5pm or on week-ends. 14. You should eat very light the first 24 hours after surgery, such as soup, crackers, pudding, etc.  Resume your normal diet the day after surgery. 15. Most patients will experience some swelling and bruising in the breast.  Ice packs and a good support bra will help.  Swelling and bruising can take several days to resolve.  16. It is common to experience some constipation if taking pain medication after surgery.  Increasing fluid intake and taking a stool softener will usually help or prevent this problem from occurring.  A mild laxative (Milk of Magnesia or Miralax) should be taken according to package directions if there are no bowel movements after 48 hours. 17. Unless discharge instructions indicate otherwise, you may remove your bandages 24-48 hours after surgery, and you may shower at that time.  You may have steri-strips (small skin tapes) in place directly over the  incision.  These strips should be left on the skin for 7-10 days.  If your surgeon used skin glue on the incision, you may shower in 24 hours.  The glue will flake off over the next 2-3 weeks.  Any sutures or staples will be removed at the office during your follow-up visit. 18. ACTIVITIES:  You may resume regular daily activities (gradually increasing) beginning the next day.  Wearing a good support bra or sports bra minimizes pain and swelling.  You may have sexual intercourse when it is comfortable. a. You may drive when you no longer are taking prescription pain medication, you can comfortably wear a seatbelt, and you can safely maneuver your car and apply brakes. b. RETURN TO WORK:  ______________________________________________________________________________________ 19. You should see your doctor in the office for a follow-up appointment approximately two weeks after your surgery.  Your doctors nurse will typically make your follow-up appointment when she calls you with your pathology report.  Expect your pathology report 2-3 business days after your surgery.  You may call to check if you do not hear from [place] after three days. 20. OTHER INSTRUCTIONS: _______________________________________________________________________________________________ _____________________________________________________________________________________________________________________________________ _____________________________________________________________________________________________________________________________________ _____________________________________________________________________________________________________________________________________  WHEN TO CALL YOUR DOCTOR: 6. Fever over 101.0 7. Nausea and/or vomiting. 8. Extreme swelling or bruising. 9. Continued bleeding from incision. 10. Increased pain, redness, or drainage from the incision.  The clinic staff is available to answer your questions  during regular business hours.  Please dont hesitate to call and ask to speak to one of the nurses for clinical concerns.  If you have a medical emergency, go to the nearest emergency room or call 911.  A surgeon from [facility] is always on call at the hospital.  For further questions, please visit [email or web address]  Post Anesthesia Home Care Instructions ° °Activity: °Get plenty of rest for the remainder of the day. A responsible adult should stay with you for 24 hours following the procedure.  °For the next 24 hours, DO NOT: °-Drive a car °-Operate machinery °-Drink alcoholic beverages °-Take any medication unless instructed by your physician °-Make any legal decisions or sign important papers. ° °Meals: °Start with liquid foods such as gelatin or soup. Progress to regular foods as tolerated. Avoid greasy, spicy, heavy foods. If nausea and/or vomiting occur, drink only clear liquids until the nausea and/or vomiting subsides. Call your physician if vomiting continues. ° °Special Instructions/Symptoms: °Your throat may feel dry or sore from the anesthesia or the breathing tube placed in your throat during surgery. If this causes discomfort, gargle with warm salt water. The discomfort should disappear within 24 hours. ° °

## 2014-06-18 NOTE — Anesthesia Postprocedure Evaluation (Signed)
Anesthesia Post Note  Patient: Krystal Kelley  Procedure(s) Performed: Procedure(s) (LRB): RIGHT BREAST LUMPECTOMY WITH RADIOACTIVE SEED AND RIGHT AXILLARY SENTINEL LYMPH NODE BIOPSY (Right) INSERTION PORT-A-CATH (Left)  Anesthesia type: General  Patient location: PACU  Post pain: Pain level controlled and Adequate analgesia  Post assessment: Post-op Vital signs reviewed, Patient's Cardiovascular Status Stable, Respiratory Function Stable, Patent Airway and Pain level controlled  Last Vitals:  Filed Vitals:   06/18/14 1530  BP: 129/68  Pulse: 94  Temp:   Resp: 15    Post vital signs: Reviewed and stable  Level of consciousness: awake, alert  and oriented  Complications: No apparent anesthesia complications

## 2014-06-19 ENCOUNTER — Encounter (HOSPITAL_BASED_OUTPATIENT_CLINIC_OR_DEPARTMENT_OTHER): Payer: Self-pay | Admitting: General Surgery

## 2014-06-24 ENCOUNTER — Ambulatory Visit (INDEPENDENT_AMBULATORY_CARE_PROVIDER_SITE_OTHER): Payer: 59 | Admitting: Podiatry

## 2014-06-24 ENCOUNTER — Encounter: Payer: Self-pay | Admitting: Podiatry

## 2014-06-24 DIAGNOSIS — G5761 Lesion of plantar nerve, right lower limb: Secondary | ICD-10-CM

## 2014-06-24 DIAGNOSIS — G5792 Unspecified mononeuropathy of left lower limb: Secondary | ICD-10-CM | POA: Diagnosis not present

## 2014-06-24 DIAGNOSIS — G5781 Other specified mononeuropathies of right lower limb: Secondary | ICD-10-CM

## 2014-06-24 NOTE — Progress Notes (Signed)
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 In her left heel.  Objective: Vital signs are stable she is alert and oriented 3 much decrease in pain on palpation third interdigital space of the right foot minimal pain on palpation left heel.  Assessment: Resolving neuroma third interdigital space of the right foot. Resolving neuritis/plantar fasciitis left heel.  Plan: Reinjected the bilateral foot today follow-up with her as needed.

## 2014-06-26 ENCOUNTER — Ambulatory Visit: Payer: 59 | Admitting: Podiatry

## 2014-06-27 ENCOUNTER — Ambulatory Visit (HOSPITAL_BASED_OUTPATIENT_CLINIC_OR_DEPARTMENT_OTHER): Payer: 59 | Admitting: Hematology and Oncology

## 2014-06-27 ENCOUNTER — Encounter: Payer: Self-pay | Admitting: *Deleted

## 2014-06-27 ENCOUNTER — Other Ambulatory Visit: Payer: Self-pay

## 2014-06-27 ENCOUNTER — Telehealth: Payer: Self-pay | Admitting: Hematology and Oncology

## 2014-06-27 VITALS — BP 115/75 | HR 89 | Temp 98.7°F | Resp 19 | Ht 65.0 in | Wt 241.0 lb

## 2014-06-27 DIAGNOSIS — Z170 Estrogen receptor positive status [ER+]: Secondary | ICD-10-CM | POA: Diagnosis not present

## 2014-06-27 DIAGNOSIS — C50311 Malignant neoplasm of lower-inner quadrant of right female breast: Secondary | ICD-10-CM | POA: Diagnosis not present

## 2014-06-27 NOTE — Progress Notes (Signed)
Met with pt post-op to assess needs. Pt relate she is doing well. Little pain only noted to SLN bx site. Denies needs at this time. Encourage pt to call with questions or concerns. Received verbal understanding.

## 2014-06-27 NOTE — Telephone Encounter (Signed)
appts made and avs printed for pt °

## 2014-06-27 NOTE — Progress Notes (Signed)
Patient Care Team: Denny C Tate, MD as PCP - General (Internal Medicine) Denny C Tate, MD (Internal Medicine)  DIAGNOSIS: No matching staging information was found for the patient.  SUMMARY OF ONCOLOGIC HISTORY:   Breast cancer of lower-inner quadrant of right female breast   05/16/2014 Initial Diagnosis Ultrasound-guided biopsy: Invasive ductal carcinoma with DCIS, grade 1-2, ER/PR positive, Ki-67 43%, HER-2 positive   05/28/2014 Breast MRI Right breast: 1.4 x 1.2 x 1 cm oval enhancing mass lower inner quadrant   06/18/2014 Surgery Right breast lumpectomy: Invasive ductal carcinoma 1.3 cm negative for LVI; DCIS 1 mm from margins, 1 sentinel node negative, grade 3 ER 95%, PR 77%, HER-2 amplified ratio 8.25, Ki-67 43% T1 cN0 M0 stage IA    CHIEF COMPLIANT: Follow-up after breast surgery  INTERVAL HISTORY: Krystal Kelley is a 44-year-old lady who underwent lumpectomy in the right breast for HER-2 positive breast cancer who is here to discuss the pathology report and to discuss the future treatment plan. She done extremely well from the surgery is slightly swollen on the palm but otherwise doing extremely well.  REVIEW OF SYSTEMS:   Constitutional: Denies fevers, chills or abnormal weight loss Eyes: Denies blurriness of vision Ears, nose, mouth, throat, and face: Denies mucositis or sore throat Respiratory: Denies cough, dyspnea or wheezes Cardiovascular: Denies palpitation, chest discomfort or lower extremity swelling Gastrointestinal:  Denies nausea, heartburn or change in bowel habits Skin: Denies abnormal skin rashes Lymphatics: Denies new lymphadenopathy or easy bruising Neurological:Denies numbness, tingling or new weaknesses Behavioral/Psych: Mood is stable, no new changes  Breast:  Very mild soreness in the arm All other systems were reviewed with the patient and are negative.  I have reviewed the past medical history, past surgical history, social history and family history with  the patient and they are unchanged from previous note.  ALLERGIES:  has No Known Allergies.  MEDICATIONS:  Current Outpatient Prescriptions  Medication Sig Dispense Refill  . ALPRAZolam (XANAX) 0.5 MG tablet   0  . amoxicillin (AMOXIL) 875 MG tablet Take 875 mg by mouth 2 (two) times daily.  0  . chlorpheniramine-HYDROcodone (TUSSIONEX) 10-8 MG/5ML LQCR   0  . hydrochlorothiazide (HYDRODIURIL) 25 MG tablet Take 25 mg by mouth daily.    . JUNEL 1/20 1-20 MG-MCG tablet   4  . ketoprofen (ORUDIS) 50 MG capsule Take 50 mg by mouth as needed.    . Ketoprofen CR (KETOPROFEN CR) 200 MG CP24 capsule SR 24 hr   0  . oxyCODONE-acetaminophen (ROXICET) 5-325 MG per tablet Take 1-2 tablets by mouth every 4 (four) hours as needed for severe pain. 40 tablet 0  . phentermine (ADIPEX-P) 37.5 MG tablet Take 37.5 mg by mouth every morning.  0  . phentermine 37.5 MG capsule Take 37.5 mg by mouth every morning.     No current facility-administered medications for this visit.    PHYSICAL EXAMINATION: ECOG PERFORMANCE STATUS: 1 - Symptomatic but completely ambulatory  Filed Vitals:   06/27/14 0859  BP: 115/75  Pulse: 89  Temp: 98.7 F (37.1 C)  Resp: 19   Filed Weights   06/27/14 0859  Weight: 241 lb (109.317 kg)    GENERAL:alert, no distress and comfortable SKIN: skin color, texture, turgor are normal, no rashes or significant lesions EYES: normal, Conjunctiva are pink and non-injected, sclera clear OROPHARYNX:no exudate, no erythema and lips, buccal mucosa, and tongue normal  NECK: supple, thyroid normal size, non-tender, without nodularity LYMPH:  no palpable lymphadenopathy in   the cervical, axillary or inguinal LUNGS: clear to auscultation and percussion with normal breathing effort HEART: regular rate & rhythm and no murmurs and no lower extremity edema ABDOMEN:abdomen soft, non-tender and normal bowel sounds Musculoskeletal:no cyanosis of digits and no clubbing  NEURO: alert & oriented  x 3 with fluent speech, no focal motor/sensory deficits  LABORATORY DATA:  I have reviewed the data as listed   Chemistry      Component Value Date/Time   NA 138 06/17/2014 1020   K 4.0 06/17/2014 1020   CL 103 06/17/2014 1020   CO2 26 06/17/2014 1020   BUN 10 06/17/2014 1020   CREATININE 0.90 06/17/2014 1020      Component Value Date/Time   CALCIUM 9.0 06/17/2014 1020       Lab Results  Component Value Date   HGB 12.9 06/18/2014    ASSESSMENT & PLAN:  Breast cancer of lower-inner quadrant of right female breast Right breast lumpectomy 06/18/2014: Invasive ductal carcinoma 1.3 cm negative for LVI; DCIS 1 mm from margins, 1 sentinel node negative, grade 3 ER 95%, PR 77%, HER-2 amplified ratio 8.25, Ki-67 43% T1 cN0 M0 stage IA  Pathology counseling: I discussed the pathology report in great detail and provided her with a copy of this report. Based on my previous discussions with her, because she is HER-2 positive, she would benefit from systemic chemotherapy.   Treatment plan: Abraxane and Herceptin weekly 12 followed by Herceptin every 3 weeks for 1 year, after 12 weeks of chemotherapy and radiation therapy, after radiation is complete antiestrogen therapy with tamoxifen 20 mg 10 years  Plan: 1. Port has been placed during surgery 2. Echocardiogram was done 06/13/2014 showing EF 55-60% 3. Chemotherapy class had been completed 4. Start chemotherapy approximately 07/18/2014  Chemotherapy counseling: Previously counseled extensively regarding risks and benefits of chemotherapy and we reviewed that once again today.  I will see the patient back one week after starting chemotherapy  No orders of the defined types were placed in this encounter.   The patient has a good understanding of the overall plan. she agrees with it. She will call with any problems that may develop before her next visit here.   Gudena, Vinay K, MD    

## 2014-06-27 NOTE — Assessment & Plan Note (Signed)
Right breast lumpectomy 06/18/2014: Invasive ductal carcinoma 1.3 cm negative for LVI; DCIS 1 mm from margins, 1 sentinel node negative, grade 3 ER 95%, PR 77%, HER-2 amplified ratio 8.25, Ki-67 43% T1 cN0 M0 stage IA  Pathology counseling: I discussed the pathology report in great detail and provided her with a copy of this report. Based on my previous discussions with her, because she is HER-2 positive, she would benefit from systemic chemotherapy. Since the tumor was greater than 1 cm in size, I would recommend standard chemotherapy with TCH 6 cycles followed by Herceptin maintenance for 1 year.  Plan: 1. Port has been placed during surgery 2. Echocardiogram was done 06/13/2014 showing EF 55-60% 3. Chemotherapy class 4. Start chemotherapy approximately 07/19/2014  Chemotherapy counseling: Previously counseled extensively regarding risks and benefits of chemotherapy and we reviewed that once again today.

## 2014-07-08 ENCOUNTER — Telehealth: Payer: Self-pay | Admitting: *Deleted

## 2014-07-08 NOTE — Telephone Encounter (Signed)
Pt called c/o drainage coming from incision site. No redness or swelling noted. Relate noted small area where steristrip fell off that drainage was coming from. Instructed pt to call [facility] and speak to Dr. Lear Ng nurse. Gave pt office number. Encourage pt to call with further needs.

## 2014-07-09 ENCOUNTER — Telehealth: Payer: Self-pay | Admitting: *Deleted

## 2014-07-09 NOTE — Telephone Encounter (Signed)
Injection in the right foot and it is still hurting . What can be done ?

## 2014-07-09 NOTE — Telephone Encounter (Signed)
Physical therapy. 

## 2014-07-10 ENCOUNTER — Encounter: Payer: Self-pay | Admitting: Genetic Counselor

## 2014-07-10 DIAGNOSIS — Z1379 Encounter for other screening for genetic and chromosomal anomalies: Secondary | ICD-10-CM | POA: Insufficient documentation

## 2014-07-10 NOTE — Progress Notes (Signed)
GENETIC TEST RESULTS  Patient Name: Krystal Kelley Patient Age: 44 y.o. Encounter Date: 07/10/2014  Referring Physician: Nicholas Lose, MD   Ms. Sakuma was called on multiple occasions in the last several days to discuss genetic test results. Unfortunately, she has not returned the calls. Please see the Genetics note from her visit on 06/13/14 for a detailed discussion of her personal and family history.  GENETIC TESTING: At the time of Ms. Lobo visit, we recommended she pursue genetic testing of multiple genes on the BreastNext gene panel. This test, which included sequencing and deletion/duplication analysis of 17 genes, was performed at [facility]. Testing was normal and did not reveal a mutation in these genes. The genes tested were ATM, BARD1, BRCA1, BRCA2, BRIP1, CDH1, CHEK2, MRE11A, MUTYH, NBN, NF1, PALB2, PTEN, RAD50, RAD51C, RAD51D, and TP53.  Since the current test is not perfect, it is possible there may be a gene mutation that current testing cannot detect, but that chance is small. It is possible that a different genetic factor, which was not part of this testing or has not yet been discovered, is responsible for the cancer diagnoses in the family. Should Ms. Switala wish to discuss or pursue this additional testing, we are happy to coordinate this at any time, but do not feel that she is at significant risk of harboring a mutation in a different gene.     Genetic testing did detect a Variant of Unknown Significance in the ATM gene called p.4477C>T, p.L1493F. At this time, it is unknown if this variant is associated with increased cancer risk or if this is a normal finding, but most variants such as this get reclassified to being inconsequential. It should not be used to make medical management decisions. With time, we suspect the lab will determine the significance of this variant, if any. If we do learn more about it, we will try to contact Ms. Holden to discuss it further.  However, it is important to stay in touch with [place] periodically and keep the address and phone number up to date.  CANCER SCREENING:  This result suggests that Ms. Basso' cancer was most likely not due to an inherited predisposition. Most cancers happen by chance and this test, along with details of her family history, suggests that her cancer falls into this category. We, therefore, recommended she continue to follow the cancer screening guidelines provided by her physician.   FAMILY MEMBERS: Women in the family are at some increased risk of developing breast cancer, over the general population risk, simply due to the family history. We recommended they have a yearly mammogram beginning at age 56, which is 29 years earlier than the youngest breast cancer, a yearly clinical breast exam, and perform monthly breast self-exams. A gynecologic exam is recommended yearly. Colon cancer screening is recommended to begin by age 77.  Family members should not pursue testing for the above VUS outside of a research setting as it has no implications for their medical management.  Lastly, we discussed with Ms. Pickford that cancer genetics is a rapidly advancing field and it is possible that new genetic tests will be appropriate for her in the future. We encouraged her to remain in contact with [place] on an annual basis so we can update her personal and family histories, and let her know of advances in cancer genetics that may benefit the family. Our contact number was provided. Ms. Crisp questions were answered to her satisfaction today, and she knows she is welcome  to call anytime with additional questions.    Steele Berg, MS, [facility] Certified Genetic Counselor phone: [phone] [email or web address]

## 2014-07-10 NOTE — Telephone Encounter (Signed)
Tried to call patient to let her know that dr Milinda Pointer stated , physical therapy, patient does not have voicemail set up

## 2014-07-12 ENCOUNTER — Other Ambulatory Visit: Payer: Self-pay | Admitting: Hematology and Oncology

## 2014-07-17 ENCOUNTER — Other Ambulatory Visit: Payer: Self-pay | Admitting: *Deleted

## 2014-07-17 DIAGNOSIS — C50311 Malignant neoplasm of lower-inner quadrant of right female breast: Secondary | ICD-10-CM

## 2014-07-18 ENCOUNTER — Other Ambulatory Visit: Payer: Self-pay | Admitting: *Deleted

## 2014-07-18 ENCOUNTER — Other Ambulatory Visit (HOSPITAL_BASED_OUTPATIENT_CLINIC_OR_DEPARTMENT_OTHER): Payer: 59

## 2014-07-18 ENCOUNTER — Encounter: Payer: Self-pay | Admitting: *Deleted

## 2014-07-18 ENCOUNTER — Ambulatory Visit (HOSPITAL_BASED_OUTPATIENT_CLINIC_OR_DEPARTMENT_OTHER): Payer: 59

## 2014-07-18 DIAGNOSIS — Z5111 Encounter for antineoplastic chemotherapy: Secondary | ICD-10-CM

## 2014-07-18 DIAGNOSIS — C50311 Malignant neoplasm of lower-inner quadrant of right female breast: Secondary | ICD-10-CM | POA: Diagnosis not present

## 2014-07-18 LAB — COMPREHENSIVE METABOLIC PANEL (CC13)
ALT: 38 U/L (ref 0–55)
AST: 29 U/L (ref 5–34)
Albumin: 3.5 g/dL (ref 3.5–5.0)
Alkaline Phosphatase: 65 U/L (ref 40–150)
Anion Gap: 11 mEq/L (ref 3–11)
BUN: 14.2 mg/dL (ref 7.0–26.0)
CO2: 26 mEq/L (ref 22–29)
Calcium: 9 mg/dL (ref 8.4–10.4)
Chloride: 104 mEq/L (ref 98–109)
Creatinine: 0.9 mg/dL (ref 0.6–1.1)
EGFR: 87 mL/min/{1.73_m2} — ABNORMAL LOW (ref >90)
Glucose: 97 mg/dl (ref 70–140)
Potassium: 3.9 mEq/L (ref 3.5–5.1)
Sodium: 141 meq/L (ref 136–145)
Total Bilirubin: 0.26 mg/dL (ref 0.20–1.20)
Total Protein: 7.3 g/dL (ref 6.4–8.3)

## 2014-07-18 LAB — CBC WITH DIFFERENTIAL/PLATELET
BASO%: 0.3 % (ref 0.0–2.0)
Basophils Absolute: 0 10e3/uL (ref 0.0–0.1)
EOS%: 1.6 % (ref 0.0–7.0)
Eosinophils Absolute: 0.2 10e3/uL (ref 0.0–0.5)
HCT: 38.4 % (ref 34.8–46.6)
HGB: 13.1 g/dL (ref 11.6–15.9)
LYMPH%: 31.5 % (ref 14.0–49.7)
MCH: 32.3 pg (ref 25.1–34.0)
MCHC: 34.1 g/dL (ref 31.5–36.0)
MCV: 94.8 fL (ref 79.5–101.0)
MONO#: 0.8 10*3/uL (ref 0.1–0.9)
MONO%: 8.8 % (ref 0.0–14.0)
NEUT#: 5.3 10e3/uL (ref 1.5–6.5)
NEUT%: 57.8 % (ref 38.4–76.8)
Platelets: 338 10e3/uL (ref 145–400)
RBC: 4.05 10*6/uL (ref 3.70–5.45)
RDW: 13.5 % (ref 11.2–14.5)
WBC: 9.1 10e3/uL (ref 3.9–10.3)
lymph#: 2.9 10e3/uL (ref 0.9–3.3)

## 2014-07-18 MED ORDER — LIDOCAINE-PRILOCAINE 2.5-2.5 % EX CREA: 1.0000 "application " | TOPICAL_CREAM | CUTANEOUS | Status: DC | PRN

## 2014-07-18 MED ORDER — DIPHENHYDRAMINE HCL 25 MG PO CAPS
50.0000 mg | ORAL_CAPSULE | Freq: Once | ORAL | Status: AC
  Administered 2014-07-18: 50 mg via ORAL

## 2014-07-18 MED ORDER — SODIUM CHLORIDE 0.9 % IV SOLN
Freq: Once | INTRAVENOUS | Status: AC
  Administered 2014-07-18: 11:00:00 via INTRAVENOUS

## 2014-07-18 MED ORDER — ACETAMINOPHEN 325 MG PO TABS
ORAL_TABLET | ORAL | Status: AC
  Filled 2014-07-18: qty 2

## 2014-07-18 MED ORDER — SODIUM CHLORIDE 0.9 % IV SOLN
Freq: Once | INTRAVENOUS | Status: AC
  Administered 2014-07-18: 11:00:00 via INTRAVENOUS
  Filled 2014-07-18: qty 4

## 2014-07-18 MED ORDER — SODIUM CHLORIDE 0.9 % IJ SOLN
10.0000 mL | INTRAMUSCULAR | Status: DC | PRN
  Administered 2014-07-18: 10 mL
  Filled 2014-07-18: qty 10

## 2014-07-18 MED ORDER — HEPARIN SOD (PORK) LOCK FLUSH 100 UNIT/ML IV SOLN
500.0000 [IU] | Freq: Once | INTRAVENOUS | Status: AC | PRN
  Administered 2014-07-18: 500 [IU]
  Filled 2014-07-18: qty 5

## 2014-07-18 MED ORDER — PROCHLORPERAZINE MALEATE 10 MG PO TABS: 10.0000 mg | ORAL_TABLET | Freq: Four times a day (QID) | ORAL | Status: DC | PRN

## 2014-07-18 MED ORDER — TRASTUZUMAB CHEMO INJECTION 440 MG
4.0000 mg/kg | Freq: Once | INTRAVENOUS | Status: AC
  Administered 2014-07-18: 441 mg via INTRAVENOUS
  Filled 2014-07-18: qty 21

## 2014-07-18 MED ORDER — ONDANSETRON HCL 8 MG PO TABS: 8.0000 mg | ORAL_TABLET | Freq: Two times a day (BID) | ORAL | Status: DC

## 2014-07-18 MED ORDER — ACETAMINOPHEN 325 MG PO TABS
650.0000 mg | ORAL_TABLET | Freq: Once | ORAL | Status: AC
  Administered 2014-07-18: 650 mg via ORAL

## 2014-07-18 MED ORDER — DIPHENHYDRAMINE HCL 25 MG PO CAPS
ORAL_CAPSULE | ORAL | Status: AC
  Filled 2014-07-18: qty 2

## 2014-07-18 MED ORDER — PACLITAXEL PROTEIN-BOUND CHEMO INJECTION 100 MG
100.0000 mg/m2 | Freq: Once | INTRAVENOUS | Status: AC
  Administered 2014-07-18: 225 mg via INTRAVENOUS
  Filled 2014-07-18: qty 45

## 2014-07-18 NOTE — Progress Notes (Signed)
Met with pt during 1st chemotherapy treatment. Denies questions or concerns at this time.  Pt relate she will fax over short term disability forms to be filled out. Informed pt that our FMLA specialist will fill in the information and fax back to her place of work. Encourage pt to call with needs. Recevied verbal understanding.

## 2014-07-18 NOTE — Patient Instructions (Signed)
[facility] Discharge Instructions for Patients Receiving Chemotherapy  Today you received the following chemotherapy agents Abraxane/Herceptin.  To help prevent nausea and vomiting after your treatment, we encourage you to take your nausea medication as prescribed.   If you develop nausea and vomiting that is not controlled by your nausea medication, call the clinic.   BELOW ARE SYMPTOMS THAT SHOULD BE REPORTED IMMEDIATELY:  *FEVER GREATER THAN 100.5 F  *CHILLS WITH OR WITHOUT FEVER  NAUSEA AND VOMITING THAT IS NOT CONTROLLED WITH YOUR NAUSEA MEDICATION  *UNUSUAL SHORTNESS OF BREATH  *UNUSUAL BRUISING OR BLEEDING  TENDERNESS IN MOUTH AND THROAT WITH OR WITHOUT PRESENCE OF ULCERS  *URINARY PROBLEMS  *BOWEL PROBLEMS  UNUSUAL RASH Items with * indicate a potential emergency and should be followed up as soon as possible.  Feel free to call the clinic you have any questions or concerns. The clinic phone number is [phone].  Please show the [place] at check-in to the Emergency Department and triage nurse.

## 2014-07-19 ENCOUNTER — Telehealth: Payer: Self-pay | Admitting: *Deleted

## 2014-07-19 NOTE — Telephone Encounter (Signed)
-----   Message from Renford Dills, RN sent at 07/18/2014 11:18 AM EDT ----- Regarding: chemo follow-up call 1st Abraxane/Herceptin  Dr. Lindi Adie  [phone]

## 2014-07-19 NOTE — Telephone Encounter (Signed)
Tried calling patient after first chemotherapy yesterday; however, patient's cell phone is not set up to receive voice mail. Will try calling again.

## 2014-07-22 ENCOUNTER — Ambulatory Visit: Payer: 59 | Admitting: Podiatry

## 2014-07-24 ENCOUNTER — Ambulatory Visit (INDEPENDENT_AMBULATORY_CARE_PROVIDER_SITE_OTHER): Payer: 59

## 2014-07-24 ENCOUNTER — Other Ambulatory Visit: Payer: Self-pay

## 2014-07-24 ENCOUNTER — Ambulatory Visit (INDEPENDENT_AMBULATORY_CARE_PROVIDER_SITE_OTHER): Payer: 59 | Admitting: Podiatry

## 2014-07-24 DIAGNOSIS — C50311 Malignant neoplasm of lower-inner quadrant of right female breast: Secondary | ICD-10-CM

## 2014-07-24 DIAGNOSIS — S9031XA Contusion of right foot, initial encounter: Secondary | ICD-10-CM

## 2014-07-24 DIAGNOSIS — M71571 Other bursitis, not elsewhere classified, right ankle and foot: Secondary | ICD-10-CM | POA: Diagnosis not present

## 2014-07-24 DIAGNOSIS — M7751 Other enthesopathy of right foot: Secondary | ICD-10-CM

## 2014-07-24 NOTE — Progress Notes (Signed)
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 He is almost 100%. She also has pain to the right fifth metatarsophalangeal joint area today which is subsiding from his previous flare.  Objective: Vital signs are stable she's alert and oriented 3 postinflammatory hyperpigmentation overlying the fifth metatarsophalangeal joint with an overlying bursitis to the lateral aspect of the fifth metatarsal head right. She has some tenderness on palpation medial calcaneal tubercle of the left heel. Radiographic evaluation demonstrates no fractures to the right with metatarsal. Soft tissue increase in density periarticular lesions present.  Assessment: Pain in limb secondary to Baxter's neuritis left and bursitis fifth metatarsophalangeal joint right foot.  Plan: Injected the left heel today with dehydrated alcohol left and dexamethasone fifth metatarsophalangeal joint right. Follow-up with her in 3-4 weeks

## 2014-07-25 ENCOUNTER — Other Ambulatory Visit (HOSPITAL_BASED_OUTPATIENT_CLINIC_OR_DEPARTMENT_OTHER): Payer: 59

## 2014-07-25 ENCOUNTER — Ambulatory Visit (HOSPITAL_BASED_OUTPATIENT_CLINIC_OR_DEPARTMENT_OTHER): Payer: 59

## 2014-07-25 ENCOUNTER — Telehealth: Payer: Self-pay | Admitting: Hematology and Oncology

## 2014-07-25 ENCOUNTER — Ambulatory Visit (HOSPITAL_BASED_OUTPATIENT_CLINIC_OR_DEPARTMENT_OTHER): Payer: 59 | Admitting: Hematology and Oncology

## 2014-07-25 ENCOUNTER — Ambulatory Visit: Payer: 59 | Admitting: Hematology and Oncology

## 2014-07-25 ENCOUNTER — Other Ambulatory Visit: Payer: 59

## 2014-07-25 VITALS — BP 124/81 | HR 87 | Temp 98.1°F | Resp 18 | Ht 65.0 in | Wt 239.4 lb

## 2014-07-25 DIAGNOSIS — C50311 Malignant neoplasm of lower-inner quadrant of right female breast: Secondary | ICD-10-CM | POA: Diagnosis not present

## 2014-07-25 DIAGNOSIS — Z5111 Encounter for antineoplastic chemotherapy: Secondary | ICD-10-CM

## 2014-07-25 DIAGNOSIS — Z5112 Encounter for antineoplastic immunotherapy: Secondary | ICD-10-CM

## 2014-07-25 DIAGNOSIS — Z170 Estrogen receptor positive status [ER+]: Secondary | ICD-10-CM

## 2014-07-25 LAB — COMPREHENSIVE METABOLIC PANEL (CC13)
ALT: 68 U/L — ABNORMAL HIGH (ref 0–55)
AST: 44 U/L — ABNORMAL HIGH (ref 5–34)
Albumin: 3.5 g/dL (ref 3.5–5.0)
Alkaline Phosphatase: 68 U/L (ref 40–150)
Anion Gap: 11 meq/L (ref 3–11)
BUN: 13.1 mg/dL (ref 7.0–26.0)
CO2: 26 meq/L (ref 22–29)
Calcium: 8.7 mg/dL (ref 8.4–10.4)
Chloride: 106 mEq/L (ref 98–109)
Creatinine: 0.9 mg/dL (ref 0.6–1.1)
EGFR: 87 mL/min/{1.73_m2} — ABNORMAL LOW (ref >90)
Glucose: 90 mg/dL (ref 70–140)
Potassium: 3.6 mEq/L (ref 3.5–5.1)
Sodium: 143 meq/L (ref 136–145)
Total Bilirubin: 0.4 mg/dL (ref 0.20–1.20)
Total Protein: 6.9 g/dL (ref 6.4–8.3)

## 2014-07-25 LAB — CBC WITH DIFFERENTIAL/PLATELET
BASO%: 0.3 % (ref 0.0–2.0)
Basophils Absolute: 0 10*3/uL (ref 0.0–0.1)
EOS%: 0.8 % (ref 0.0–7.0)
Eosinophils Absolute: 0.1 10*3/uL (ref 0.0–0.5)
HCT: 34 % — ABNORMAL LOW (ref 34.8–46.6)
HGB: 11.6 g/dL (ref 11.6–15.9)
LYMPH%: 37.1 % (ref 14.0–49.7)
MCH: 32.1 pg (ref 25.1–34.0)
MCHC: 34.1 g/dL (ref 31.5–36.0)
MCV: 94.2 fL (ref 79.5–101.0)
MONO#: 0.3 10*3/uL (ref 0.1–0.9)
MONO%: 3.5 % (ref 0.0–14.0)
NEUT#: 5.6 10*3/uL (ref 1.5–6.5)
NEUT%: 58.3 % (ref 38.4–76.8)
Platelets: 380 10e3/uL (ref 145–400)
RBC: 3.61 10*6/uL — ABNORMAL LOW (ref 3.70–5.45)
RDW: 13.4 % (ref 11.2–14.5)
WBC: 9.7 10*3/uL (ref 3.9–10.3)
lymph#: 3.6 10e3/uL — ABNORMAL HIGH (ref 0.9–3.3)

## 2014-07-25 MED ORDER — DIPHENHYDRAMINE HCL 25 MG PO CAPS
ORAL_CAPSULE | ORAL | Status: AC
  Filled 2014-07-25: qty 2

## 2014-07-25 MED ORDER — TRASTUZUMAB CHEMO INJECTION 440 MG
2.0000 mg/kg | Freq: Once | INTRAVENOUS | Status: AC
  Administered 2014-07-25: 210 mg via INTRAVENOUS
  Filled 2014-07-25: qty 10

## 2014-07-25 MED ORDER — ACETAMINOPHEN 325 MG PO TABS
ORAL_TABLET | ORAL | Status: AC
Start: 2014-07-25 — End: 2014-07-25
  Filled 2014-07-25: qty 2

## 2014-07-25 MED ORDER — SODIUM CHLORIDE 0.9 % IJ SOLN
10.0000 mL | INTRAMUSCULAR | Status: DC | PRN
  Administered 2014-07-25: 10 mL
  Filled 2014-07-25: qty 10

## 2014-07-25 MED ORDER — PACLITAXEL PROTEIN-BOUND CHEMO INJECTION 100 MG
100.0000 mg/m2 | Freq: Once | INTRAVENOUS | Status: AC
  Administered 2014-07-25: 225 mg via INTRAVENOUS
  Filled 2014-07-25: qty 45

## 2014-07-25 MED ORDER — ACETAMINOPHEN 325 MG PO TABS
650.0000 mg | ORAL_TABLET | Freq: Once | ORAL | Status: AC
  Administered 2014-07-25: 650 mg via ORAL

## 2014-07-25 MED ORDER — DIPHENHYDRAMINE HCL 25 MG PO CAPS
50.0000 mg | ORAL_CAPSULE | Freq: Once | ORAL | Status: AC
  Administered 2014-07-25: 50 mg via ORAL

## 2014-07-25 MED ORDER — SODIUM CHLORIDE 0.9 % IV SOLN
Freq: Once | INTRAVENOUS | Status: AC
  Administered 2014-07-25: 12:00:00 via INTRAVENOUS
  Filled 2014-07-25: qty 4

## 2014-07-25 MED ORDER — HEPARIN SOD (PORK) LOCK FLUSH 100 UNIT/ML IV SOLN
500.0000 [IU] | Freq: Once | INTRAVENOUS | Status: AC | PRN
  Administered 2014-07-25: 500 [IU]
  Filled 2014-07-25: qty 5

## 2014-07-25 MED ORDER — SODIUM CHLORIDE 0.9 % IV SOLN
Freq: Once | INTRAVENOUS | Status: AC
  Administered 2014-07-25: 11:00:00 via INTRAVENOUS

## 2014-07-25 NOTE — Progress Notes (Signed)
Patient Care Team: Albina Billet, MD as PCP - General (Internal Medicine) Albina Billet, MD (Internal Medicine)  DIAGNOSIS: No matching staging information was found for the patient.  SUMMARY OF ONCOLOGIC HISTORY:   Breast cancer of lower-inner quadrant of right female breast   05/16/2014 Initial Diagnosis Ultrasound-guided biopsy: Invasive ductal carcinoma with DCIS, grade 1-2, ER/PR positive, Ki-67 43%, HER-2 positive   05/28/2014 Breast MRI Right breast: 1.4 x 1.2 x 1 cm oval enhancing mass lower inner quadrant   06/18/2014 Surgery Right breast lumpectomy: Invasive ductal carcinoma 1.3 cm negative for LVI; DCIS 1 mm from margins, 1 sentinel node negative, grade 3 ER 95%, PR 77%, HER-2 amplified ratio 8.25, Ki-67 43% T1 cN0 M0 stage IA    CHIEF COMPLIANT: week 2/12 Abraxane Herceptin  INTERVAL HISTORY: Krystal Kelley is 44 year old above-mentioned history of right-sided breast cancer treated with lumpectomy and is now currently on adjuvant chemotherapy with Abraxane and Herceptin. She tolerated cycle one extremely well without any problems or concerns. She denies any side effects including nausea vomiting diarrhea constipation. She had some leakage from the breast a few weeks ago but that has stopped.  REVIEW OF SYSTEMS:   Constitutional: Denies fevers, chills or abnormal weight loss Eyes: Denies blurriness of vision Ears, nose, mouth, throat, and face: Denies mucositis or sore throat Respiratory: Denies cough, dyspnea or wheezes Cardiovascular: Denies palpitation, chest discomfort or lower extremity swelling Gastrointestinal:  Denies nausea, heartburn or change in bowel habits Skin: Denies abnormal skin rashes Lymphatics: Denies new lymphadenopathy or easy bruising Neurological:Denies numbness, tingling or new weaknesses Behavioral/Psych: Mood is stable, no new changes  Breast: leakage in the breast stopped and suture has healed very well All other systems were reviewed with the patient  and are negative.  I have reviewed the past medical history, past surgical history, social history and family history with the patient and they are unchanged from previous note.  ALLERGIES:  has No Known Allergies.  MEDICATIONS:  Current Outpatient Prescriptions  Medication Sig Dispense Refill  . ALPRAZolam (XANAX) 0.5 MG tablet   0  . amoxicillin (AMOXIL) 875 MG tablet Take 875 mg by mouth 2 (two) times daily.  0  . chlorpheniramine-HYDROcodone (TUSSIONEX) 10-8 MG/5ML LQCR   0  . hydrochlorothiazide (HYDRODIURIL) 25 MG tablet Take 25 mg by mouth daily.    . JUNEL 1/20 1-20 MG-MCG tablet   4  . ketoprofen (ORUDIS) 50 MG capsule Take 50 mg by mouth as needed.    . Ketoprofen CR (KETOPROFEN CR) 200 MG CP24 capsule SR 24 hr   0  . lidocaine-prilocaine (EMLA) cream Apply 1 application topically as needed. 30 g 0  . ondansetron (ZOFRAN) 8 MG tablet Take 1 tablet (8 mg total) by mouth 2 (two) times daily. Start the day after chemo for 2 days. Then take as needed for nausea or vomiting. 30 tablet 1  . oxyCODONE-acetaminophen (ROXICET) 5-325 MG per tablet Take 1-2 tablets by mouth every 4 (four) hours as needed for severe pain. 40 tablet 0  . phentermine (ADIPEX-P) 37.5 MG tablet Take 37.5 mg by mouth every morning.  0  . phentermine 37.5 MG capsule Take 37.5 mg by mouth every morning.    . prochlorperazine (COMPAZINE) 10 MG tablet Take 1 tablet (10 mg total) by mouth every 6 (six) hours as needed (Nausea or vomiting). 30 tablet 1   No current facility-administered medications for this visit.    PHYSICAL EXAMINATION: ECOG PERFORMANCE STATUS: 0 - Asymptomatic  Filed  Vitals:   07/25/14 1018  BP: 124/81  Pulse: 87  Temp: 98.1 F (36.7 C)  Resp: 18   Filed Weights   07/25/14 1018  Weight: 239 lb 6.4 oz (108.591 kg)    GENERAL:alert, no distress and comfortable SKIN: skin color, texture, turgor are normal, no rashes or significant lesions EYES: normal, Conjunctiva are pink and  non-injected, sclera clear OROPHARYNX:no exudate, no erythema and lips, buccal mucosa, and tongue normal  NECK: supple, thyroid normal size, non-tender, without nodularity LYMPH:  no palpable lymphadenopathy in the cervical, axillary or inguinal LUNGS: clear to auscultation and percussion with normal breathing effort HEART: regular rate & rhythm and no murmurs and no lower extremity edema ABDOMEN:abdomen soft, non-tender and normal bowel sounds Musculoskeletal:no cyanosis of digits and no clubbing  NEURO: alert & oriented x 3 with fluent speech, no focal motor/sensory deficits BREAST:no evidence of leakage from the surgical incision site. (exam performed in the presence of a chaperone)  LABORATORY DATA:  I have reviewed the data as listed   Chemistry      Component Value Date/Time   NA 143 07/25/2014 1004   NA 138 06/17/2014 1020   K 3.6 07/25/2014 1004   K 4.0 06/17/2014 1020   CL 103 06/17/2014 1020   CO2 26 07/25/2014 1004   CO2 26 06/17/2014 1020   BUN 13.1 07/25/2014 1004   BUN 10 06/17/2014 1020   CREATININE 0.9 07/25/2014 1004   CREATININE 0.90 06/17/2014 1020      Component Value Date/Time   CALCIUM 8.7 07/25/2014 1004   CALCIUM 9.0 06/17/2014 1020   ALKPHOS 68 07/25/2014 1004   AST 44* 07/25/2014 1004   ALT 68* 07/25/2014 1004   BILITOT 0.40 07/25/2014 1004       Lab Results  Component Value Date   WBC 9.7 07/25/2014   HGB 11.6 07/25/2014   HCT 34.0* 07/25/2014   MCV 94.2 07/25/2014   PLT 380 07/25/2014   NEUTROABS 5.6 07/25/2014     RADIOGRAPHIC STUDIES: I have personally reviewed the radiology reports and agreed with their findings. Dg Foot Complete Right  07/24/2014   3 views of the right foot demonstrates postsurgical foot status post  bunion repair which is gone on to heal very nicely she has hammertoe  deformities to her lesser digits. Tailor's bunion deformity with the spur  to the lateral aspect of the fifth metatarsal head right foot. This is   consistent with tailor's bunion deformity there's overlying soft tissue  edema possibly bursitis associated with this.    ASSESSMENT & PLAN:  Breast cancer of lower-inner quadrant of right female breast Right breast lumpectomy 06/18/2014: Invasive ductal carcinoma 1.3 cm negative for LVI; DCIS 1 mm from margins, 1 sentinel node negative, grade 3 ER 95%, PR 77%, HER-2 amplified ratio 8.25, Ki-67 43% T1 cN0 M0 stage IA  Treatment plan: Abraxane and Herceptin weekly 12 started 07/18/2014 followed by Herceptin every 3 weeks for 1 year, after 12 weeks of chemotherapy and radiation therapy, after radiation is complete antiestrogen therapy with tamoxifen 20 mg 10 years  Current treatment: Week 2/12 Abraxane Herceptin Chemotherapy toxicities:  Patient denies any nausea vomiting diarrhea constipation or any side effects to chemotherapy.  Chemotherapy monitoring: Echocardiogram was done 06/13/2014 showing EF 55-60% Return to clinic in 3 weeks for clinical follow-up in weekly for chemotherapy.   No orders of the defined types were placed in this encounter.   The patient has a good understanding of the overall plan. she agrees  with it. She will call with any problems that may develop before her next visit here.   Rulon Eisenmenger, MD

## 2014-07-25 NOTE — Assessment & Plan Note (Signed)
Right breast lumpectomy 06/18/2014: Invasive ductal carcinoma 1.3 cm negative for LVI; DCIS 1 mm from margins, 1 sentinel node negative, grade 3 ER 95%, PR 77%, HER-2 amplified ratio 8.25, Ki-67 43% T1 cN0 M0 stage IA  Treatment plan: Abraxane and Herceptin weekly 12 started 07/18/2014 followed by Herceptin every 3 weeks for 1 year, after 12 weeks of chemotherapy and radiation therapy, after radiation is complete antiestrogen therapy with tamoxifen 20 mg 10 years  Current treatment: Week 2/12 Abraxane Herceptin Chemotherapy toxicities:    Chemotherapy monitoring: Echocardiogram was done 06/13/2014 showing EF 55-60% Return to clinic in 2 weeks for clinical follow-up in weekly for chemotherapy.

## 2014-07-25 NOTE — Patient Instructions (Signed)
[facility] Discharge Instructions for Patients Receiving Chemotherapy  Today you received the following chemotherapy agents Abraxane/Herceptin.  To help prevent nausea and vomiting after your treatment, we encourage you to take your nausea medication as directed.    If you develop nausea and vomiting that is not controlled by your nausea medication, call the clinic.   BELOW ARE SYMPTOMS THAT SHOULD BE REPORTED IMMEDIATELY:  *FEVER GREATER THAN 100.5 F  *CHILLS WITH OR WITHOUT FEVER  NAUSEA AND VOMITING THAT IS NOT CONTROLLED WITH YOUR NAUSEA MEDICATION  *UNUSUAL SHORTNESS OF BREATH  *UNUSUAL BRUISING OR BLEEDING  TENDERNESS IN MOUTH AND THROAT WITH OR WITHOUT PRESENCE OF ULCERS  *URINARY PROBLEMS  *BOWEL PROBLEMS  UNUSUAL RASH Items with * indicate a potential emergency and should be followed up as soon as possible.  Feel free to call the clinic you have any questions or concerns. The clinic phone number is [phone].  Please show the [place] at check-in to the Emergency Department and triage nurse.

## 2014-07-25 NOTE — Telephone Encounter (Signed)
Appointment made and avs printed for patient

## 2014-07-26 ENCOUNTER — Telehealth: Payer: Self-pay

## 2014-07-26 NOTE — Telephone Encounter (Signed)
Ann a Tourist information centre manager from [employer]. She states a provider statement was faxed for short term disability for the patient. No mentioned was found in the chart so asked her to refax it. She will put attn [employer]. I told her Arrie Aran would be able to get the form to the correct person in the office. The original fax was supposed to have been on 3/31, they would like a return by Tuesday. I said this may not be possible but we will try.

## 2014-07-29 ENCOUNTER — Encounter: Payer: Self-pay | Admitting: Hematology and Oncology

## 2014-07-29 NOTE — Progress Notes (Signed)
I faxed disability forms and notes to [employer]   [phone]

## 2014-08-01 ENCOUNTER — Other Ambulatory Visit (HOSPITAL_BASED_OUTPATIENT_CLINIC_OR_DEPARTMENT_OTHER): Payer: 59

## 2014-08-01 ENCOUNTER — Ambulatory Visit (HOSPITAL_BASED_OUTPATIENT_CLINIC_OR_DEPARTMENT_OTHER): Payer: 59

## 2014-08-01 ENCOUNTER — Other Ambulatory Visit (HOSPITAL_COMMUNITY)
Admission: RE | Admit: 2014-08-01 | Discharge: 2014-08-01 | Disposition: A | Discharge status: Home / Self Care | Payer: 59 | Source: Ambulatory Visit | Attending: Oncology | Admitting: Oncology

## 2014-08-01 VITALS — BP 132/84 | HR 89 | Temp 98.7°F | Resp 20

## 2014-08-01 DIAGNOSIS — Z5112 Encounter for antineoplastic immunotherapy: Secondary | ICD-10-CM

## 2014-08-01 DIAGNOSIS — C50311 Malignant neoplasm of lower-inner quadrant of right female breast: Secondary | ICD-10-CM

## 2014-08-01 DIAGNOSIS — Z5111 Encounter for antineoplastic chemotherapy: Secondary | ICD-10-CM

## 2014-08-01 DIAGNOSIS — C50319 Malignant neoplasm of lower-inner quadrant of unspecified female breast: Secondary | ICD-10-CM | POA: Insufficient documentation

## 2014-08-01 LAB — COMPREHENSIVE METABOLIC PANEL
ALT: 54 U/L — ABNORMAL HIGH (ref 0–35)
AST: 41 U/L — ABNORMAL HIGH (ref 0–37)
BUN: 12 mg/dL (ref 6–23)
CO2: 28 mmol/L (ref 19–32)
Chloride: 107 mmol/L (ref 96–112)
Creatinine, Ser: 0.93 mg/dL (ref 0.50–1.10)
GFR calc non Af Amer: 74 mL/min — ABNORMAL LOW (ref >90)
Potassium: 4 mmol/L (ref 3.5–5.1)
Sodium: 140 mmol/L (ref 135–145)

## 2014-08-01 LAB — CBC WITH DIFFERENTIAL/PLATELET
BASO%: 0.5 % (ref 0.0–2.0)
Basophils Absolute: 0 10e3/uL (ref 0.0–0.1)
EOS%: 1.7 % (ref 0.0–7.0)
Eosinophils Absolute: 0.1 10*3/uL (ref 0.0–0.5)
HCT: 34.2 % — ABNORMAL LOW (ref 34.8–46.6)
HGB: 11.3 g/dL — ABNORMAL LOW (ref 11.6–15.9)
LYMPH%: 43.2 % (ref 14.0–49.7)
MCH: 31.6 pg (ref 25.1–34.0)
MCHC: 33 g/dL (ref 31.5–36.0)
MCV: 95.5 fL (ref 79.5–101.0)
MONO#: 0.4 10*3/uL (ref 0.1–0.9)
MONO%: 6.8 % (ref 0.0–14.0)
NEUT#: 2.8 10*3/uL (ref 1.5–6.5)
NEUT%: 47.8 % (ref 38.4–76.8)
Platelets: 397 10*3/uL (ref 145–400)
RBC: 3.58 10*6/uL — ABNORMAL LOW (ref 3.70–5.45)
RDW: 13.6 % (ref 11.2–14.5)
WBC: 5.9 10e3/uL (ref 3.9–10.3)
lymph#: 2.5 10e3/uL (ref 0.9–3.3)

## 2014-08-01 LAB — COMPREHENSIVE METABOLIC PANEL WITH GFR
Albumin: 3.5 g/dL (ref 3.5–5.2)
Alkaline Phosphatase: 54 U/L (ref 39–117)
Anion gap: 5 (ref 5–15)
Calcium: 8.8 mg/dL (ref 8.4–10.5)
GFR calc Af Amer: 85 mL/min — ABNORMAL LOW (ref >90)
Glucose, Bld: 97 mg/dL (ref 70–99)
Total Bilirubin: 0.2 mg/dL — ABNORMAL LOW (ref 0.3–1.2)
Total Protein: 7 g/dL (ref 6.0–8.3)

## 2014-08-01 MED ORDER — HEPARIN SOD (PORK) LOCK FLUSH 100 UNIT/ML IV SOLN
500.0000 [IU] | Freq: Once | INTRAVENOUS | Status: AC | PRN
  Administered 2014-08-01: 500 [IU]
  Filled 2014-08-01: qty 5

## 2014-08-01 MED ORDER — DIPHENHYDRAMINE HCL 25 MG PO CAPS
50.0000 mg | ORAL_CAPSULE | Freq: Once | ORAL | Status: AC
  Administered 2014-08-01: 50 mg via ORAL

## 2014-08-01 MED ORDER — ACETAMINOPHEN 325 MG PO TABS
ORAL_TABLET | ORAL | Status: AC
  Filled 2014-08-01: qty 2

## 2014-08-01 MED ORDER — SODIUM CHLORIDE 0.9 % IV SOLN
Freq: Once | INTRAVENOUS | Status: AC
  Administered 2014-08-01: 10:00:00 via INTRAVENOUS

## 2014-08-01 MED ORDER — SODIUM CHLORIDE 0.9 % IV SOLN
Freq: Once | INTRAVENOUS | Status: AC
  Administered 2014-08-01: 11:00:00 via INTRAVENOUS
  Filled 2014-08-01: qty 4

## 2014-08-01 MED ORDER — PACLITAXEL PROTEIN-BOUND CHEMO INJECTION 100 MG
100.0000 mg/m2 | Freq: Once | INTRAVENOUS | Status: AC
  Administered 2014-08-01: 225 mg via INTRAVENOUS
  Filled 2014-08-01: qty 45

## 2014-08-01 MED ORDER — ACETAMINOPHEN 325 MG PO TABS
650.0000 mg | ORAL_TABLET | Freq: Once | ORAL | Status: AC
  Administered 2014-08-01: 650 mg via ORAL

## 2014-08-01 MED ORDER — SODIUM CHLORIDE 0.9 % IJ SOLN
10.0000 mL | INTRAMUSCULAR | Status: DC | PRN
  Administered 2014-08-01: 10 mL
  Filled 2014-08-01: qty 10

## 2014-08-01 MED ORDER — DIPHENHYDRAMINE HCL 25 MG PO CAPS
ORAL_CAPSULE | ORAL | Status: AC
  Filled 2014-08-01: qty 2

## 2014-08-01 MED ORDER — TRASTUZUMAB CHEMO INJECTION 440 MG
2.0000 mg/kg | Freq: Once | INTRAVENOUS | Status: AC
  Administered 2014-08-01: 210 mg via INTRAVENOUS
  Filled 2014-08-01: qty 10

## 2014-08-01 NOTE — Patient Instructions (Signed)
[facility] Discharge Instructions for Patients Receiving Chemotherapy  Today you received the following chemotherapy agents Abraxane/Herceptin.  To help prevent nausea and vomiting after your treatment, we encourage you to take your nausea medication as directed.    If you develop nausea and vomiting that is not controlled by your nausea medication, call the clinic.   BELOW ARE SYMPTOMS THAT SHOULD BE REPORTED IMMEDIATELY:  *FEVER GREATER THAN 100.5 F  *CHILLS WITH OR WITHOUT FEVER  NAUSEA AND VOMITING THAT IS NOT CONTROLLED WITH YOUR NAUSEA MEDICATION  *UNUSUAL SHORTNESS OF BREATH  *UNUSUAL BRUISING OR BLEEDING  TENDERNESS IN MOUTH AND THROAT WITH OR WITHOUT PRESENCE OF ULCERS  *URINARY PROBLEMS  *BOWEL PROBLEMS  UNUSUAL RASH Items with * indicate a potential emergency and should be followed up as soon as possible.  Feel free to call the clinic you have any questions or concerns. The clinic phone number is [phone].  Please show the [facility] at check-in to the Emergency Department and triage nurse.

## 2014-08-07 ENCOUNTER — Telehealth: Payer: Self-pay

## 2014-08-07 NOTE — Telephone Encounter (Signed)
Returned pt call re: diarrhes.  Pt reports diarrhea started Saturday.  Last treatment Thursday.  (Also starting to lose hair).  Occurs app 4x/day - whenever she eats.  Pt has not taken anything for it until today - she took imodium.  Pt reports she is drinking.  Pt will be in tomorrow for lab and chemo appt.  Reminder set to check labs.    Advised pt to call after 24 hours if she is having problems with diarrhea, and if unable to drink or also vomiting to call after 4-6 hours.  Let her know we can give her IV fluids and/or other medications if needed.  Pt voiced understanding.

## 2014-08-08 ENCOUNTER — Ambulatory Visit (HOSPITAL_BASED_OUTPATIENT_CLINIC_OR_DEPARTMENT_OTHER): Payer: 59

## 2014-08-08 ENCOUNTER — Other Ambulatory Visit (HOSPITAL_BASED_OUTPATIENT_CLINIC_OR_DEPARTMENT_OTHER): Payer: 59

## 2014-08-08 VITALS — BP 137/78 | HR 78 | Temp 98.3°F | Resp 20

## 2014-08-08 DIAGNOSIS — Z5112 Encounter for antineoplastic immunotherapy: Secondary | ICD-10-CM

## 2014-08-08 DIAGNOSIS — Z5111 Encounter for antineoplastic chemotherapy: Secondary | ICD-10-CM

## 2014-08-08 DIAGNOSIS — C50311 Malignant neoplasm of lower-inner quadrant of right female breast: Secondary | ICD-10-CM | POA: Diagnosis not present

## 2014-08-08 LAB — COMPREHENSIVE METABOLIC PANEL (CC13)
ALT: 57 U/L — ABNORMAL HIGH (ref 0–55)
AST: 41 U/L — ABNORMAL HIGH (ref 5–34)
Albumin: 3.3 g/dL — ABNORMAL LOW (ref 3.5–5.0)
Alkaline Phosphatase: 70 U/L (ref 40–150)
Anion Gap: 10 mEq/L (ref 3–11)
BUN: 10.9 mg/dL (ref 7.0–26.0)
CO2: 22 mEq/L (ref 22–29)
Calcium: 8.6 mg/dL (ref 8.4–10.4)
Chloride: 109 mEq/L (ref 98–109)
Creatinine: 0.8 mg/dL (ref 0.6–1.1)
EGFR: >90 ml/min/1.73 m2 (ref >90)
Glucose: 95 mg/dl (ref 70–140)
Potassium: 3.7 mEq/L (ref 3.5–5.1)
Sodium: 142 mEq/L (ref 136–145)
Total Bilirubin: 0.2 mg/dL (ref 0.20–1.20)
Total Protein: 6.4 g/dL (ref 6.4–8.3)

## 2014-08-08 LAB — CBC WITH DIFFERENTIAL/PLATELET
BASO%: 0.1 % (ref 0.0–2.0)
Basophils Absolute: 0 10e3/uL (ref 0.0–0.1)
EOS%: 0.8 % (ref 0.0–7.0)
Eosinophils Absolute: 0.1 10e3/uL (ref 0.0–0.5)
HCT: 30.8 % — ABNORMAL LOW (ref 34.8–46.6)
HGB: 10.5 g/dL — ABNORMAL LOW (ref 11.6–15.9)
LYMPH%: 36.8 % (ref 14.0–49.7)
MCH: 32.1 pg (ref 25.1–34.0)
MCHC: 34.1 g/dL (ref 31.5–36.0)
MCV: 94.2 fL (ref 79.5–101.0)
MONO#: 0.5 10*3/uL (ref 0.1–0.9)
MONO%: 6.8 % (ref 0.0–14.0)
NEUT#: 4.1 10e3/uL (ref 1.5–6.5)
NEUT%: 55.5 % (ref 38.4–76.8)
Platelets: 415 10*3/uL — ABNORMAL HIGH (ref 145–400)
RBC: 3.27 10*6/uL — ABNORMAL LOW (ref 3.70–5.45)
RDW: 14.1 % (ref 11.2–14.5)
WBC: 7.4 10*3/uL (ref 3.9–10.3)
lymph#: 2.7 10*3/uL (ref 0.9–3.3)

## 2014-08-08 MED ORDER — SODIUM CHLORIDE 0.9 % IV SOLN: Freq: Once | INTRAVENOUS | Status: DC

## 2014-08-08 MED ORDER — PACLITAXEL PROTEIN-BOUND CHEMO INJECTION 100 MG
100.0000 mg/m2 | Freq: Once | INTRAVENOUS | Status: AC
  Administered 2014-08-08: 225 mg via INTRAVENOUS
  Filled 2014-08-08: qty 45

## 2014-08-08 MED ORDER — DIPHENHYDRAMINE HCL 25 MG PO CAPS
ORAL_CAPSULE | ORAL | Status: AC
  Filled 2014-08-08: qty 1

## 2014-08-08 MED ORDER — DIPHENHYDRAMINE HCL 25 MG PO CAPS
50.0000 mg | ORAL_CAPSULE | Freq: Once | ORAL | Status: AC
  Administered 2014-08-08: 50 mg via ORAL

## 2014-08-08 MED ORDER — ACETAMINOPHEN 325 MG PO TABS
650.0000 mg | ORAL_TABLET | Freq: Once | ORAL | Status: AC
Start: 2014-08-08 — End: 2014-08-08
  Administered 2014-08-08: 650 mg via ORAL

## 2014-08-08 MED ORDER — TRASTUZUMAB CHEMO INJECTION 440 MG
2.0000 mg/kg | Freq: Once | INTRAVENOUS | Status: AC
  Administered 2014-08-08: 210 mg via INTRAVENOUS
  Filled 2014-08-08: qty 10

## 2014-08-08 MED ORDER — SODIUM CHLORIDE 0.9 % IV SOLN
Freq: Once | INTRAVENOUS | Status: AC
  Administered 2014-08-08: 15:00:00 via INTRAVENOUS

## 2014-08-08 MED ORDER — HEPARIN SOD (PORK) LOCK FLUSH 100 UNIT/ML IV SOLN
500.0000 [IU] | Freq: Once | INTRAVENOUS | Status: AC | PRN
  Administered 2014-08-08: 500 [IU]
  Filled 2014-08-08: qty 5

## 2014-08-08 MED ORDER — DIPHENHYDRAMINE HCL 25 MG PO CAPS
ORAL_CAPSULE | ORAL | Status: AC
  Filled 2014-08-08: qty 2

## 2014-08-08 MED ORDER — ACETAMINOPHEN 325 MG PO TABS
ORAL_TABLET | ORAL | Status: AC
  Filled 2014-08-08: qty 2

## 2014-08-08 MED ORDER — SODIUM CHLORIDE 0.9 % IV SOLN
Freq: Once | INTRAVENOUS | Status: AC
  Administered 2014-08-08: 15:00:00 via INTRAVENOUS
  Filled 2014-08-08: qty 4

## 2014-08-08 MED ORDER — SODIUM CHLORIDE 0.9 % IJ SOLN
10.0000 mL | INTRAMUSCULAR | Status: DC | PRN
  Administered 2014-08-08: 10 mL
  Filled 2014-08-08: qty 10

## 2014-08-08 NOTE — Patient Instructions (Addendum)
[facility] Discharge Instructions for Patients Receiving Chemotherapy  Today you received the following chemotherapy agents: Herceptin, ABRAXANE.  To help prevent nausea and vomiting after your treatment, we encourage you to take your nausea medication as prescribed.   If you develop nausea and vomiting that is not controlled by your nausea medication, call the clinic.   BELOW ARE SYMPTOMS THAT SHOULD BE REPORTED IMMEDIATELY:  *FEVER GREATER THAN 100.5 F  *CHILLS WITH OR WITHOUT FEVER  NAUSEA AND VOMITING THAT IS NOT CONTROLLED WITH YOUR NAUSEA MEDICATION  *UNUSUAL SHORTNESS OF BREATH  *UNUSUAL BRUISING OR BLEEDING  TENDERNESS IN MOUTH AND THROAT WITH OR WITHOUT PRESENCE OF ULCERS  *URINARY PROBLEMS  *BOWEL PROBLEMS  UNUSUAL RASH Items with * indicate a potential emergency and should be followed up as soon as possible.  Feel free to call the clinic you have any questions or concerns. The clinic phone number is [phone].  Please show the [facility] at check-in to the Emergency Department and triage nurse.

## 2014-08-10 NOTE — Consult Note (Signed)
Brief Consult Note: Diagnosis: ?choledocholithiasis.   Patient was seen by consultant.   Comments: Krystal Kelley is a very pleasant 44 y/o black female with cholelithiasis & supected choledocholithiasis given hyperbilirubinemia/transaminitis, biliary colic & dilated CBD 59mm.  Her potassium is 3.2 & she is getting 46mEQ KCL in her IVFs started this morning.  She will need ERCP with stone extraction & sphincterotomy followed by cholecystectomy.  Discussed risks/benefits of procedure which include but are not limited to pancreatitis, bleeding, infection, perforation & drug reaction.  Patient agrees with this plan & consent will be obtained.  Plan: 1) NPO 2) K repletion  3) Heparin suspended 4) ERCP with Dr Allen Norris as soon as possible 5) Continue supportive measures, pain control, IVFs Thanks for allowing [place] to participate in the care of Ms. Dye.  Please see full dictated note. #[id number].  Electronic Signatures: Andria Meuse (NP)  (Signed 23-Feb-15 12:49)  Authored: Brief Consult Note   Last Updated: 23-Feb-15 12:49 by Andria Meuse (NP)

## 2014-08-10 NOTE — Discharge Summary (Signed)
PATIENT NAME:  Krystal Kelley, Krystal Kelley MR#:  [id number] DATE OF BIRTH:  Sep 16, 1970  DATE OF ADMISSION:  06/10/2013 DATE OF DISCHARGE:  06/12/2013  FINAL DIAGNOSIS: Choledocholithiasis and acute and chronic calculus cholecystitis.   HOSPITAL COURSE SUMMARY: The patient was admitted on the 22nd with epigastric abdominal pain and elevated liver function tests.  Dilated common bile duct seen on gallbladder ultrasound. She was seen the following day by GI medicine. An ERCP was performed by Dr. Allen Norris. This demonstrated that the entire biliary tree was mildly dilated. Sludge was found. Sphincterotomy and balloon extraction was performed. I then took her to the operating room the following day and performed a laparoscopic cholecystectomy and found acute calculus cholecystitis in addition. She did well postoperatively and was stable for discharge later that same day.   DISCHARGE INSTRUCTIONS: Call the office with any questions or concerns, fever or temperature elevation.   DISCHARGE MEDICATIONS: Hydrochlorothiazide 25 milligrams by mouth once a day, phentermine 37.5 milligrams by mouth once a day, Percocet 5/325 one cap every 4 to 6 hours as needed for pain.   CONDITION ON DISCHARGE:  Improved and stable.   ____________________________ Jeannette How. Marina Gravel, MD mab:mk D: 06/25/2013 19:39:10 ET T: 06/25/2013 20:51:14 ET JOB#: [id number]  cc: Elta Guadeloupe A. Marina Gravel, MD, <Dictator> Yi Haugan A Radie Berges MD ELECTRONICALLY SIGNED 06/26/2013 0:39

## 2014-08-10 NOTE — Consult Note (Signed)
Chief Complaint:  Subjective/Chief Complaint Pt denies any abdominal pain, nausea or vomiting.  Appetite improving.  For cholecystectomy today.   VITAL SIGNS/ANCILLARY NOTES: **Vital Signs.:   24-Feb-15 10:26  Vital Signs Type Q 4hr  Temperature Temperature (F) 98.5  Temperature Source oral  Pulse Pulse 85  Respirations Respirations 18  Systolic BP Systolic BP 092  Diastolic BP (mmHg) Diastolic BP (mmHg) 81  Mean BP 93  Pulse Ox % Pulse Ox % 98  Pulse Ox Activity Level  At rest  Oxygen Delivery Room Air/ 21 %   Brief Assessment:  GEN well developed, well nourished, no acute distress, A/Ox3.   Cardiac Regular   Respiratory normal resp effort   Gastrointestinal details normal Soft  Nontender  Nondistended  No masses palpable  Bowel sounds normal  No rebound tenderness  No gaurding   EXTR negative cyanosis/clubbing, negative edema   Additional Physical Exam Skin: warm, dry, intact HEENT: sclera clear, nonicteric   Assessment/Plan:  Assessment/Plan:  Assessment Dilated CBD with sludge/choledocholithiasis:  s/p ERCP with sphincterotomy.  Much improved.   Calculous cholecystitis: for Cholecystectomy today   Plan 1) recheck LFTS 2) cholecystectomy per surgery 3) continue supportive measures Please call if you have any questions or concerns   Electronic Signatures: Andria Meuse (NP)  (Signed 24-Feb-15 11:50)  Authored: Chief Complaint, VITAL SIGNS/ANCILLARY NOTES, Brief Assessment, Assessment/Plan   Last Updated: 24-Feb-15 11:50 by Andria Meuse (NP)

## 2014-08-10 NOTE — H&P (Signed)
   Subjective/Chief Complaint Epigastric/RUQ pain, elevated bilirubin/AST/ALT, gallstones and dilated CBD on U/S   History of Present Illness Krystal Kelley is a pleasant 44 yo F with a history of recurrent epigastric pain who presented yesterday with epigastric pain and nausea.  She woke yesterday at 9 am with epigastric pain.  It continued to worsen throughout the day.  It was associated with nausea.  She went to the ER yesterday and was found to have gallstones, mildly dilated CBD and elevated bilirubin, AST and ALT.  She says that she has had a history of this in the past, approx 5 times in the past year.  Always resolved on own.  Had to leave today for nephews christening and is now back.  Currently without pain but has been taking percocet and can feel pain coming on.  I am direct admitting her for management of possible choledocholithiasis.   Past History Foot surgery   Past Med/Surgical Hx:  Denies medical history:   Laparoscopy:   BUNION:   Carpal Tunnel Release:   ALLERGIES:  NKA: None  HOME MEDICATIONS:  Medication Instructions Status  Zofran ODT 4 mg oral tablet, disintegrating 1 tab(s) orally every 4 to 6 hours- as needed  Active  hydrochlorothiazide 25 mg oral tablet 1 tab(s) orally once a day Active   Family and Social History:  Family History Hypertension  Diabetes Mellitus  Cancer   Social History negative tobacco, negative ETOH   Place of Living Home   Review of Systems:  Subjective/Chief Complaint Epigastric pain, nausea   Fever/Chills No   Cough No   Sputum No   Abdominal Pain Yes   Diarrhea No   Constipation No   Nausea/Vomiting Yes   SOB/DOE No   Chest Pain No   Dysuria No   Tolerating Diet Nauseated   ROS Pt not able to provide ROS   Physical Exam:  GEN well developed   HEENT PERRL, hearing intact to voice   NECK supple  trachea midline    RESP normal resp effort  clear BS    CARD regular rate  no murmur    ABD positive tenderness   soft  distended  hypoactive BS  Mild RUQ tenderness, no rebound, mild guarding    LYMPH negative neck, negative axillae   EXTR negative cyanosis/clubbing, negative edema   SKIN normal to palpation, skin turgor good   NEURO motor/sensory function intact   PSYCH alert, A+O to time, place, person    Assessment/Admission Diagnosis Krystal Kelley is a pleasant 44 yo F with a history of recurrent epigastric pain who presented with epigastric pain.  Labs show hyperbilirubinemia and transaminitis.  U/S shows gallstones and ? dilated CBD.   Plan Will admit for hydration, NPO after midnight.  Will obtain labs in am to evaluate need for possible ERCP for choledocholithiasis.  Will defer overall care to Dr. Marina Gravel, weekly daytime surgeon.   Electronic Signatures: Trudie Reed (MD)  (Signed [phone] 20:32)  Authored: HOME MEDICATIONS, PHYSICAL EXAM Nichols Corter, Glena Norfolk (MD)  (Signed [phone] 18:34)  Authored: CHIEF COMPLAINT and HISTORY, PAST MEDICAL/SURGIAL HISTORY, ALLERGIES, HOME MEDICATIONS, FAMILY AND SOCIAL HISTORY, REVIEW OF SYSTEMS, PHYSICAL EXAM, ASSESSMENT AND PLAN   Last Updated: 22-Feb-15 20:32 by Trudie Reed (MD)

## 2014-08-10 NOTE — Op Note (Signed)
PATIENT NAME:  Krystal Kelley, Krystal Kelley MR#:  [id number] DATE OF BIRTH:  July 18, 1970  DATE OF PROCEDURE:  06/12/2013  PREOPERATIVE DIAGNOSIS: Choledocholithiasis.   POSTOPERATIVE DIAGNOSIS: Choledocholithiasis with acute calculus cholecystitis.   PROCEDURE: Laparoscopic cholecystectomy.  SURGEON: Sherri Rad, MD  ASSISTANT: None.    ESTIMATED BLOOD LOSS: Minimal.   DESCRIPTION OF PROCEDURE: With informed consent, supine position, general oral endotracheal anesthesia, the patient's abdomen was widely clipped of hair, prepped and draped with ChloraPrep solution. Timeout was observed. A 12 mm blunt Hassan trocar was placed through an open technique through an infraumbilical transverse oriented skin incision with stay sutures being passed through the fascia and pneumoperitoneum was established. The patient was then positioned in reverse Trendelenburg and airplane right side up. The gallbladder appeared to be edematous and distended. 5 mm Epigastric port was placed, two 5 mm first assistant ports in the right subcostal margin. The gallbladder was aspirated of 50 mL of thick bile. The gallbladder wall was edematous and injected. The gallbladder was grasped along its fundus and elevated towards the right shoulder. Lateral traction was achieved on Hartmann pouch. Critical view cholecystectomy was achieved. Cystic duct was triply clipped on the portal side, singly clipped on the gallbladder side, and divided. Cystic artery was likewise divided between 3 clips on the portal side and singly clipped on the gallbladder side with sharp scissors. The gallbladder was then retrieved off the gallbladder fossa utilizing hook electrocautery, placed into an Endo Catch device and retrieved. The right upper quadrant was irrigated with a total of 1 liter of normal saline and aspirated dry. Hemostasis was ensured on the operative field with point cautery in the gallbladder fossa. During the extraction process of the gallbladder, 5 mm  telescope was then inserted in the epigastric region demonstrating no evidence of injury to the bowel from the trocar site insertion at the umbilicus. With ports removed under direct visualization, pneumoperitoneum was released the infraumbilical fascial defect was closed with multiple interrupted #0 Vicryl sutures in vertical orientation. A total of 30 mL of 0.25% plain Marcaine was infiltrated along all skin and fascial incisions prior to closure. JP was placed in morrison's pouch.  Then 4-0 Vicryl subcuticular was applied to all skin edges followed by benzoin, Steri-Strips, Telfa and Tegaderm. The patient was subsequently extubated and taken to the recovery room in stable and satisfactory condition by anesthesia services.  ____________________________ Jeannette How Marina Gravel, MD mab:sb D: 06/12/2013 16:57:32 ET T: 06/12/2013 17:23:11 ET JOB#: [id number]  cc: Elta Guadel[place] A. Marina Gravel, MD, <Dictator> Hortencia Conradi MD ELECTRONICALLY SIGNED 06/20/2013 10:24

## 2014-08-10 NOTE — Consult Note (Signed)
PATIENT NAME:  Krystal Kelley, Krystal Kelley MR#:  [id number] DATE OF BIRTH:  09-21-1970  DATE OF CONSULTATION:  06/11/2013  PRIMARY CARE PHYSICIAN: Dr. Benita Stabile. REFERRING PHYSICIAN:  Dr. Pat Patrick. GASTROENTEROLOGIST: Dr. Lucilla Lame. CONSULTING PHYSICIAN:  Andria Meuse, NP  REASON FOR CONSULTATION:  Choledocholithiasis.   HISTORY OF PRESENT ILLNESS: Krystal Kelley is a very pleasant 44 year old black female. She was in her usual state of good health until two days ago when she awakened with epigastric pain. She says the pain started out as 5 out of 10 on pain scale. The pain is constantly and increasing in severity up to a 10 out of 10 on pain scale. She began to have nausea but did not vomit. She has been losing weight about 5 pounds in the last month, but she was given a prescription from her GYN. She denies any fevers or chills. Denies any jaundice. She denies any clay -colored stools. She did have a similar episode of pain about a year and a half to two years ago. Ultrasound done  February 21st shows a 9 mm common bile duct sludge and cholelithiasis with calculi up to 15 mm. She had a sonographic Murphy's sign and minimal intrahepatic biliary dilatation. Her bilirubin was 1.9 yesterday, up to 3.5 today, mostly direct. Alkaline phosphatase is normal at 64 yesterday, up to 88 today. She had an AST was 170, down to 165 today and ALT  was 133 up to 193 today. She has a normal lipase at 144. CBC was normal and urine pregnancy was negative.   PAST MEDICAL AND SURGICAL HISTORY: Plantar fasciitis surgery, foot surgery, carpal tunnel release and bunionectomy. She has had exploratory laparotomy.   MEDICATIONS PRIOR TO ADMISSION: Hydrochlorothiazide 25 mg daily, phentermine 37.5 mg daily, Zofran 4 mg p.r.n. q.4-6 hours.   ALLERGIES: No known drug allergies.   FAMILY HISTORY: There is no known family history of colon carcinoma, liver or chronic GI problems.   SOCIAL HISTORY: She is divorced. She works at [employer]. 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 She denies any tobacco, alcohol or illicit drug use.   REVIEW OF SYSTEMS: See HPI, otherwise negative 12 point review of systems review of systems.   PHYSICAL EXAMINATION: VITAL SIGNS: Temperature 98.8, pulse 98, respirations 17, blood pressure 117/77, oxygen saturation 95% on room air.  GENERAL: She is a well-developed, well-nourished, black female who is alert, oriented, pleasant, cooperative, in no acute distress.  HEENT: Sclerae clear, anicteric. Conjunctivae pink. Oropharynx pink and moist without lesions.  NECK: Supple without mass or thyromegaly.  CHEST: Heart regular rate and rhythm. Normal S1, S2. No murmurs, clicks, rubs or gallops.  LUNGS: Clear to auscultation bilaterally.  ABDOMEN: Positive bowel sounds x 4. No bruits auscultated. She has mild epigastric tenderness on deep palpation, positive Murphy's sign. No rebound, tenderness or guarding. No hepatosplenomegaly or mass.  EXTREMITIES: Without clubbing or edema.  RECTAL: Deferred.  SKIN: Warm and dry without rash or jaundice.  NEUROLOGICAL: Grossly intact.  MUSCULOSKELETAL: Good equal movement and strength bilaterally.  PSYCHIATRIC: Alert, cooperative, normal mood and affect.   LABORATORY STUDIES: Troponin was negative. Glucose 102, sodium 135, potassium 3.2, otherwise normal met-7. See HPI.  IMPRESSION: Krystal Kelley is a very pleasant 44 year old white female with cholelithiasis and suspected choledocholithiasis given hyperbilirubinemia/transaminitis, biliary colic and dilated common bile duct 9 mm. Her potassium is 3.2 and she is getting 40 mEq of potassium chloride in her IV fluids started this morning. She will need ERCP with stone extraction and sphincterotomy followed by  cholecystectomy. Discussed risks, benefits of procedure, which include but are not limited to pancreatitis bleeding, infection, perforation, drug reaction. She agrees with this plan, and consent will be obtained.   PLAN: 1. N.p.o.  2. K  Repletion.  3. Heparin was suspended.  4. ERCP with Dr. Allen Norris as soon as possible today.  5. Continue supportive measures, pain control, IV fluids.   Thank you for allowing [place] to participate in the care of Krystal Kelley.   ____________________________ Andria Meuse, NP klj:sg D: 06/11/2013 12:49:05 ET T: 06/11/2013 13:05:22 ET JOB#: [id number]  cc: Andria Meuse, NP, <Dictator> Leona Carry Hall Busing, MD  Andria Meuse FNP ELECTRONICALLY SIGNED 06/14/2013 10:35

## 2014-08-14 ENCOUNTER — Ambulatory Visit (INDEPENDENT_AMBULATORY_CARE_PROVIDER_SITE_OTHER): Payer: 59 | Admitting: Podiatry

## 2014-08-14 ENCOUNTER — Ambulatory Visit: Payer: Self-pay | Admitting: Podiatry

## 2014-08-14 VITALS — BP 145/90 | HR 98 | Resp 16

## 2014-08-14 DIAGNOSIS — M71571 Other bursitis, not elsewhere classified, right ankle and foot: Secondary | ICD-10-CM | POA: Diagnosis not present

## 2014-08-14 DIAGNOSIS — M7751 Other enthesopathy of right foot: Secondary | ICD-10-CM

## 2014-08-14 NOTE — Assessment & Plan Note (Addendum)
Right breast lumpectomy 06/18/2014: Invasive ductal carcinoma 1.3 cm negative for LVI; DCIS 1 mm from margins, 1 sentinel node negative, grade 3 ER 95%, PR 77%, HER-2 amplified ratio 8.25, Ki-67 43% T1 cN0 M0 stage IA  Treatment plan: Abraxane and Herceptin weekly 12 started 07/18/2014 followed by Herceptin every 3 weeks for 1 year, after 12 weeks of chemotherapy and radiation therapy, after radiation is complete antiestrogen therapy with tamoxifen 20 mg 10 years  Current treatment: Week 5/12 Abraxane Herceptin Chemotherapy toxicities:  Patient denies any nausea vomiting diarrhea constipation or any side effects to chemotherapy.  Chemotherapy monitoring: Echocardiogram was done 06/13/2014 showing EF 55-60% Return to clinic in 2 weeks for clinical follow-up in weekly for chemotherapy.

## 2014-08-14 NOTE — Progress Notes (Signed)
She presents today for follow-up of her bursitis capsulitis fifth metatarsophalangeal joint of the right foot and states that it is improving. She is currently taking chemotherapy for breast cancer and is starting to lose her hair. She states that her left foot is doing fine and without symptoms at this point.  Objective: Vital signs are stable she is alert and oriented 3. Pain on palpation fifth metatarsophalangeal joint of the right foot and no pain on palpation medial calcaneal tubercle of the left heel.  Assessment: Well-healing neuritis plantar fasciitis left foot. Bursitis and capsulitis right foot slowly resolving.  Plan: Reinjected the right fifth metatarsophalangeal joint today with 4 mg of dexamethasone and local anesthetic. Follow-up with me in 3 weeks.

## 2014-08-15 ENCOUNTER — Telehealth: Payer: Self-pay | Admitting: Hematology and Oncology

## 2014-08-15 ENCOUNTER — Ambulatory Visit (HOSPITAL_BASED_OUTPATIENT_CLINIC_OR_DEPARTMENT_OTHER): Payer: 59

## 2014-08-15 ENCOUNTER — Ambulatory Visit (HOSPITAL_BASED_OUTPATIENT_CLINIC_OR_DEPARTMENT_OTHER): Payer: 59 | Admitting: Hematology and Oncology

## 2014-08-15 VITALS — BP 129/83 | HR 92 | Temp 98.8°F | Resp 18 | Ht 65.0 in | Wt 240.8 lb

## 2014-08-15 DIAGNOSIS — D6481 Anemia due to antineoplastic chemotherapy: Secondary | ICD-10-CM

## 2014-08-15 DIAGNOSIS — Z5111 Encounter for antineoplastic chemotherapy: Secondary | ICD-10-CM

## 2014-08-15 DIAGNOSIS — C50311 Malignant neoplasm of lower-inner quadrant of right female breast: Secondary | ICD-10-CM

## 2014-08-15 DIAGNOSIS — R197 Diarrhea, unspecified: Secondary | ICD-10-CM

## 2014-08-15 LAB — COMPREHENSIVE METABOLIC PANEL (CC13)
ALT: 37 U/L (ref 0–55)
AST: 24 U/L (ref 5–34)
Albumin: 3.5 g/dL (ref 3.5–5.0)
Alkaline Phosphatase: 69 U/L (ref 40–150)
Anion Gap: 11 mEq/L (ref 3–11)
BUN: 12.6 mg/dL (ref 7.0–26.0)
CO2: 27 mEq/L (ref 22–29)
Calcium: 9.3 mg/dL (ref 8.4–10.4)
Chloride: 104 mEq/L (ref 98–109)
Creatinine: 0.9 mg/dL (ref 0.6–1.1)
EGFR: >90 mL/min/{1.73_m2} (ref >90)
Glucose: 100 mg/dL (ref 70–140)
Potassium: 3.6 mEq/L (ref 3.5–5.1)
Sodium: 142 mEq/L (ref 136–145)
Total Bilirubin: 0.28 mg/dL (ref 0.20–1.20)
Total Protein: 7 g/dL (ref 6.4–8.3)

## 2014-08-15 LAB — CBC WITH DIFFERENTIAL/PLATELET
BASO%: 0.5 % (ref 0.0–2.0)
Basophils Absolute: 0.1 10e3/uL (ref 0.0–0.1)
EOS%: 0.4 % (ref 0.0–7.0)
Eosinophils Absolute: 0 10*3/uL (ref 0.0–0.5)
HCT: 33.2 % — ABNORMAL LOW (ref 34.8–46.6)
HGB: 11.5 g/dL — ABNORMAL LOW (ref 11.6–15.9)
LYMPH%: 27.8 % (ref 14.0–49.7)
MCH: 32.4 pg (ref 25.1–34.0)
MCHC: 34.7 g/dL (ref 31.5–36.0)
MCV: 93.4 fL (ref 79.5–101.0)
MONO#: 0.7 10*3/uL (ref 0.1–0.9)
MONO%: 6.7 % (ref 0.0–14.0)
NEUT#: 6.8 10e3/uL — ABNORMAL HIGH (ref 1.5–6.5)
NEUT%: 64.6 % (ref 38.4–76.8)
Platelets: 522 10e3/uL — ABNORMAL HIGH (ref 145–400)
RBC: 3.56 10*6/uL — ABNORMAL LOW (ref 3.70–5.45)
RDW: 14.6 % — ABNORMAL HIGH (ref 11.2–14.5)
WBC: 10.6 10*3/uL — ABNORMAL HIGH (ref 3.9–10.3)
lymph#: 2.9 10*3/uL (ref 0.9–3.3)

## 2014-08-15 MED ORDER — ACETAMINOPHEN 325 MG PO TABS
650.0000 mg | ORAL_TABLET | Freq: Once | ORAL | Status: AC
  Administered 2014-08-15: 650 mg via ORAL

## 2014-08-15 MED ORDER — SODIUM CHLORIDE 0.9 % IJ SOLN
10.0000 mL | INTRAMUSCULAR | Status: DC | PRN
  Administered 2014-08-15: 10 mL
  Filled 2014-08-15: qty 10

## 2014-08-15 MED ORDER — PACLITAXEL PROTEIN-BOUND CHEMO INJECTION 100 MG
100.0000 mg/m2 | Freq: Once | INTRAVENOUS | Status: AC
  Administered 2014-08-15: 225 mg via INTRAVENOUS
  Filled 2014-08-15: qty 45

## 2014-08-15 MED ORDER — HEPARIN SOD (PORK) LOCK FLUSH 100 UNIT/ML IV SOLN
500.0000 [IU] | Freq: Once | INTRAVENOUS | Status: AC | PRN
  Administered 2014-08-15: 500 [IU]
  Filled 2014-08-15: qty 5

## 2014-08-15 MED ORDER — DIPHENHYDRAMINE HCL 25 MG PO CAPS
ORAL_CAPSULE | ORAL | Status: AC
  Filled 2014-08-15: qty 2

## 2014-08-15 MED ORDER — TRASTUZUMAB CHEMO INJECTION 440 MG
2.0000 mg/kg | Freq: Once | INTRAVENOUS | Status: AC
  Administered 2014-08-15: 210 mg via INTRAVENOUS
  Filled 2014-08-15: qty 10

## 2014-08-15 MED ORDER — SODIUM CHLORIDE 0.9 % IV SOLN
Freq: Once | INTRAVENOUS | Status: AC
  Administered 2014-08-15: 11:00:00 via INTRAVENOUS
  Filled 2014-08-15: qty 4

## 2014-08-15 MED ORDER — ACETAMINOPHEN 325 MG PO TABS
ORAL_TABLET | ORAL | Status: AC
Start: 2014-08-15 — End: 2014-08-15
  Filled 2014-08-15: qty 2

## 2014-08-15 MED ORDER — DIPHENHYDRAMINE HCL 25 MG PO CAPS
50.0000 mg | ORAL_CAPSULE | Freq: Once | ORAL | Status: AC
  Administered 2014-08-15: 50 mg via ORAL

## 2014-08-15 MED ORDER — SODIUM CHLORIDE 0.9 % IV SOLN: Freq: Once | INTRAVENOUS | Status: DC

## 2014-08-15 MED ORDER — SODIUM CHLORIDE 0.9 % IV SOLN
Freq: Once | INTRAVENOUS | Status: AC
  Administered 2014-08-15: 11:00:00 via INTRAVENOUS

## 2014-08-15 NOTE — Progress Notes (Signed)
Patient Care Team: Albina Billet, MD as PCP - General (Internal Medicine) Albina Billet, MD (Internal Medicine)  DIAGNOSIS: No matching staging information was found for the patient.  SUMMARY OF ONCOLOGIC HISTORY:   Breast cancer of lower-inner quadrant of right female breast   05/16/2014 Initial Diagnosis Ultrasound-guided biopsy: Invasive ductal carcinoma with DCIS, grade 1-2, ER/PR positive, Ki-67 43%, HER-2 positive   05/28/2014 Breast MRI Right breast: 1.4 x 1.2 x 1 cm oval enhancing mass lower inner quadrant   06/18/2014 Surgery Right breast lumpectomy: Invasive ductal carcinoma 1.3 cm negative for LVI; DCIS 1 mm from margins, 1 sentinel node negative, grade 3 ER 95%, PR 77%, HER-2 amplified ratio 8.25, Ki-67 43% T1 cN0 M0 stage IA    CHIEF COMPLIANT: Weight 5/12 Abraxane Herceptin  INTERVAL HISTORY: Krystal Kelley is a 44 year old lady with above-mentioned history of right-sided breast cancer currently on adjuvant chemotherapy with Abraxane and Herceptin. She developed diarrhea over the last couple weeks and started taking Imodium. She take 1-2 Imodium per day. It appears that diarrhea is somewhat better currently. She has been trying to drink lots of liquids to keep up with her fluid losses. Apart from this she has no other complaints.  REVIEW OF SYSTEMS:   Constitutional: Denies fevers, chills or abnormal weight loss Eyes: Denies blurriness of vision Ears, nose, mouth, throat, and face: Denies mucositis or sore throat Respiratory: Denies cough, dyspnea or wheezes Cardiovascular: Denies palpitation, chest discomfort or lower extremity swelling Gastrointestinal:  Complains of diarrhea Skin: Denies abnormal skin rashes Lymphatics: Denies new lymphadenopathy or easy bruising Neurological:Denies numbness, tingling or new weaknesses Behavioral/Psych: Mood is stable, no new changes  All other systems were reviewed with the patient and are negative.  I have reviewed the past medical  history, past surgical history, social history and family history with the patient and they are unchanged from previous note.  ALLERGIES:  has No Known Allergies.  MEDICATIONS:  Current Outpatient Prescriptions  Medication Sig Dispense Refill  . ALPRAZolam (XANAX) 0.5 MG tablet   0  . chlorpheniramine-HYDROcodone (TUSSIONEX) 10-8 MG/5ML LQCR   0  . hydrochlorothiazide (HYDRODIURIL) 25 MG tablet Take 25 mg by mouth daily.    . JUNEL 1/20 1-20 MG-MCG tablet   4  . ketoprofen (ORUDIS) 50 MG capsule Take 50 mg by mouth as needed.    . Ketoprofen CR (KETOPROFEN CR) 200 MG CP24 capsule SR 24 hr   0  . lidocaine-prilocaine (EMLA) cream Apply 1 application topically as needed. 30 g 0  . loperamide (IMODIUM) 2 MG capsule Take 2 mg by mouth as needed for diarrhea or loose stools.    . ondansetron (ZOFRAN) 8 MG tablet Take 1 tablet (8 mg total) by mouth 2 (two) times daily. Start the day after chemo for 2 days. Then take as needed for nausea or vomiting. 30 tablet 1  . oxyCODONE-acetaminophen (ROXICET) 5-325 MG per tablet Take 1-2 tablets by mouth every 4 (four) hours as needed for severe pain. 40 tablet 0  . phentermine (ADIPEX-P) 37.5 MG tablet Take 37.5 mg by mouth every morning.  0  . phentermine 37.5 MG capsule Take 37.5 mg by mouth every morning.    . prochlorperazine (COMPAZINE) 10 MG tablet Take 1 tablet (10 mg total) by mouth every 6 (six) hours as needed (Nausea or vomiting). 30 tablet 1   No current facility-administered medications for this visit.   Facility-Administered Medications Ordered in Other Visits  Medication Dose Route Frequency Provider Last Rate  Last Dose  . 0.9 %  sodium chloride infusion   Intravenous Once Nicholas Lose, MD      . heparin lock flush 100 unit/mL  500 Units Intracatheter Once PRN Nicholas Lose, MD      . sodium chloride 0.9 % injection 10 mL  10 mL Intracatheter PRN Nicholas Lose, MD        PHYSICAL EXAMINATION: ECOG PERFORMANCE STATUS: 1 - Symptomatic but  completely ambulatory  Filed Vitals:   08/15/14 0947  BP: 129/83  Pulse: 92  Temp: 98.8 F (37.1 C)  Resp: 18   Filed Weights   08/15/14 0947  Weight: 240 lb 12.8 oz (109.226 kg)    GENERAL:alert, no distress and comfortable SKIN: skin color, texture, turgor are normal, no rashes or significant lesions EYES: normal, Conjunctiva are pink and non-injected, sclera clear OROPHARYNX:no exudate, no erythema and lips, buccal mucosa, and tongue normal  NECK: supple, thyroid normal size, non-tender, without nodularity LYMPH:  no palpable lymphadenopathy in the cervical, axillary or inguinal LUNGS: clear to auscultation and percussion with normal breathing effort HEART: regular rate & rhythm and no murmurs and no lower extremity edema ABDOMEN:abdomen soft, non-tender and normal bowel sounds Musculoskeletal:no cyanosis of digits and no clubbing  NEURO: alert & oriented x 3 with fluent speech, no focal motor/sensory deficits  LABORATORY DATA:  I have reviewed the data as listed   Chemistry      Component Value Date/Time   NA 142 08/15/2014 0924   NA 140 08/01/2014 0918   NA 135* 06/11/2013 0408   K 3.6 08/15/2014 0924   K 4.0 08/01/2014 0918   K 3.2* 06/11/2013 0408   CL 107 08/01/2014 0918   CL 102 06/11/2013 0408   CO2 27 08/15/2014 0924   CO2 28 08/01/2014 0918   CO2 26 06/11/2013 0408   BUN 12.6 08/15/2014 0924   BUN 12 08/01/2014 0918   BUN 15 06/11/2013 0408   CREATININE 0.9 08/15/2014 0924   CREATININE 0.93 08/01/2014 0918   CREATININE 1.16 06/11/2013 0408      Component Value Date/Time   CALCIUM 9.3 08/15/2014 0924   CALCIUM 8.8 08/01/2014 0918   CALCIUM 8.7 06/11/2013 0408   ALKPHOS 69 08/15/2014 0924   ALKPHOS 54 08/01/2014 0918   ALKPHOS 75 06/12/2013 1541   AST 24 08/15/2014 0924   AST 41* 08/01/2014 0918   AST 137* 06/12/2013 1541   ALT 37 08/15/2014 0924   ALT 54* 08/01/2014 0918   ALT 192* 06/12/2013 1541   BILITOT 0.28 08/15/2014 0924   BILITOT 0.2*  08/01/2014 0918       Lab Results  Component Value Date   WBC 10.6* 08/15/2014   HGB 11.5* 08/15/2014   HCT 33.2* 08/15/2014   MCV 93.4 08/15/2014   PLT 522* 08/15/2014   NEUTROABS 6.8* 08/15/2014    ASSESSMENT & PLAN:  Breast cancer of lower-inner quadrant of right female breast Right breast lumpectomy 06/18/2014: Invasive ductal carcinoma 1.3 cm negative for LVI; DCIS 1 mm from margins, 1 sentinel node negative, grade 3 ER 95%, PR 77%, HER-2 amplified ratio 8.25, Ki-67 43% T1 cN0 M0 stage IA  Treatment plan: Abraxane and Herceptin weekly 12 started 07/18/2014 followed by Herceptin every 3 weeks for 1 year, after 12 weeks of chemotherapy and radiation therapy, after radiation is complete antiestrogen therapy with tamoxifen 20 mg 10 years  Current treatment: Week 5/12 Abraxane Herceptin Chemotherapy toxicities:  1. Diarrhea: Currently on Imodium. 2. Anemia grade 1: Hemoglobin improved to  10.5 today.  Chemotherapy monitoring: Echocardiogram was done 06/13/2014 showing EF 55-60% Return to clinic in 2 weeks for clinical follow-up in weekly for chemotherapy.    Orders Placed This Encounter  Procedures  . CBC with Differential    Standing Status: Future     Number of Occurrences:      Standing Expiration Date: 08/15/2015  . Comprehensive metabolic panel (Cmet) - [facility]    Standing Status: Future     Number of Occurrences:      Standing Expiration Date: 08/15/2015   The patient has a good understanding of the overall plan. she agrees with it. She will call with any problems that may develop before her next visit here.   Rulon Eisenmenger, MD

## 2014-08-15 NOTE — Patient Instructions (Signed)
[facility] Discharge Instructions for Patients Receiving Chemotherapy  Today you received the following chemotherapy agents: Herceptin, ABRAXANE.  To help prevent nausea and vomiting after your treatment, we encourage you to take your nausea medication as prescribed.   If you develop nausea and vomiting that is not controlled by your nausea medication, call the clinic.   BELOW ARE SYMPTOMS THAT SHOULD BE REPORTED IMMEDIATELY:  *FEVER GREATER THAN 100.5 F  *CHILLS WITH OR WITHOUT FEVER  NAUSEA AND VOMITING THAT IS NOT CONTROLLED WITH YOUR NAUSEA MEDICATION  *UNUSUAL SHORTNESS OF BREATH  *UNUSUAL BRUISING OR BLEEDING  TENDERNESS IN MOUTH AND THROAT WITH OR WITHOUT PRESENCE OF ULCERS  *URINARY PROBLEMS  *BOWEL PROBLEMS  UNUSUAL RASH Items with * indicate a potential emergency and should be followed up as soon as possible.  Feel free to call the clinic you have any questions or concerns. The clinic phone number is [phone].  Please show the [place] at check-in to the Emergency Department and triage nurse.

## 2014-08-15 NOTE — Telephone Encounter (Signed)
pt came in...needed to go to chemo....i added all appts poer pt request...she said she will get copy from chemo

## 2014-08-22 ENCOUNTER — Ambulatory Visit (HOSPITAL_BASED_OUTPATIENT_CLINIC_OR_DEPARTMENT_OTHER): Payer: 59

## 2014-08-22 ENCOUNTER — Other Ambulatory Visit (HOSPITAL_BASED_OUTPATIENT_CLINIC_OR_DEPARTMENT_OTHER): Payer: 59

## 2014-08-22 VITALS — BP 140/85 | HR 92 | Temp 99.0°F | Resp 20

## 2014-08-22 DIAGNOSIS — C50311 Malignant neoplasm of lower-inner quadrant of right female breast: Secondary | ICD-10-CM

## 2014-08-22 DIAGNOSIS — Z5112 Encounter for antineoplastic immunotherapy: Secondary | ICD-10-CM

## 2014-08-22 DIAGNOSIS — Z5111 Encounter for antineoplastic chemotherapy: Secondary | ICD-10-CM

## 2014-08-22 LAB — COMPREHENSIVE METABOLIC PANEL (CC13)
ALT: 37 U/L (ref 0–55)
AST: 22 U/L (ref 5–34)
Albumin: 3.3 g/dL — ABNORMAL LOW (ref 3.5–5.0)
Alkaline Phosphatase: 61 U/L (ref 40–150)
Anion Gap: 12 mEq/L — ABNORMAL HIGH (ref 3–11)
BUN: 11.7 mg/dL (ref 7.0–26.0)
CO2: 25 mEq/L (ref 22–29)
Calcium: 9 mg/dL (ref 8.4–10.4)
Chloride: 107 mEq/L (ref 98–109)
Creatinine: 0.9 mg/dL (ref 0.6–1.1)
EGFR: >90 mL/min/{1.73_m2} (ref >90)
Glucose: 100 mg/dL (ref 70–140)
Potassium: 3.9 mEq/L (ref 3.5–5.1)
Sodium: 143 mEq/L (ref 136–145)
Total Bilirubin: 0.26 mg/dL (ref 0.20–1.20)
Total Protein: 6.5 g/dL (ref 6.4–8.3)

## 2014-08-22 LAB — CBC WITH DIFFERENTIAL/PLATELET
BASO%: 0.3 % (ref 0.0–2.0)
Basophils Absolute: 0 10*3/uL (ref 0.0–0.1)
EOS%: 1 % (ref 0.0–7.0)
Eosinophils Absolute: 0.1 10*3/uL (ref 0.0–0.5)
HCT: 32.4 % — ABNORMAL LOW (ref 34.8–46.6)
HGB: 11 g/dL — ABNORMAL LOW (ref 11.6–15.9)
LYMPH%: 28.7 % (ref 14.0–49.7)
MCH: 32.4 pg (ref 25.1–34.0)
MCHC: 34 g/dL (ref 31.5–36.0)
MCV: 95.3 fL (ref 79.5–101.0)
MONO#: 0.6 10*3/uL (ref 0.1–0.9)
MONO%: 7.2 % (ref 0.0–14.0)
NEUT#: 5.4 10*3/uL (ref 1.5–6.5)
NEUT%: 62.8 % (ref 38.4–76.8)
Platelets: 420 10e3/uL — ABNORMAL HIGH (ref 145–400)
RBC: 3.4 10e6/uL — ABNORMAL LOW (ref 3.70–5.45)
RDW: 14.6 % — ABNORMAL HIGH (ref 11.2–14.5)
WBC: 8.6 10*3/uL (ref 3.9–10.3)
lymph#: 2.5 10*3/uL (ref 0.9–3.3)

## 2014-08-22 MED ORDER — SODIUM CHLORIDE 0.9 % IV SOLN
2.0000 mg/kg | Freq: Once | INTRAVENOUS | Status: AC
  Administered 2014-08-22: 210 mg via INTRAVENOUS
  Filled 2014-08-22: qty 10

## 2014-08-22 MED ORDER — PACLITAXEL PROTEIN-BOUND CHEMO INJECTION 100 MG
100.0000 mg/m2 | Freq: Once | INTRAVENOUS | Status: AC
  Administered 2014-08-22: 225 mg via INTRAVENOUS
  Filled 2014-08-22: qty 45

## 2014-08-22 MED ORDER — DIPHENHYDRAMINE HCL 25 MG PO CAPS
ORAL_CAPSULE | ORAL | Status: AC
  Filled 2014-08-22: qty 2

## 2014-08-22 MED ORDER — ACETAMINOPHEN 325 MG PO TABS
650.0000 mg | ORAL_TABLET | Freq: Once | ORAL | Status: AC
  Administered 2014-08-22: 650 mg via ORAL

## 2014-08-22 MED ORDER — SODIUM CHLORIDE 0.9 % IV SOLN
Freq: Once | INTRAVENOUS | Status: AC
  Administered 2014-08-22: 10:00:00 via INTRAVENOUS

## 2014-08-22 MED ORDER — SODIUM CHLORIDE 0.9 % IV SOLN
Freq: Once | INTRAVENOUS | Status: AC
  Administered 2014-08-22: 10:00:00 via INTRAVENOUS
  Filled 2014-08-22: qty 4

## 2014-08-22 MED ORDER — HEPARIN SOD (PORK) LOCK FLUSH 100 UNIT/ML IV SOLN
500.0000 [IU] | Freq: Once | INTRAVENOUS | Status: AC | PRN
Start: 2014-08-22 — End: 2014-08-22
  Administered 2014-08-22: 500 [IU]
  Filled 2014-08-22: qty 5

## 2014-08-22 MED ORDER — ACETAMINOPHEN 325 MG PO TABS
ORAL_TABLET | ORAL | Status: AC
  Filled 2014-08-22: qty 2

## 2014-08-22 MED ORDER — SODIUM CHLORIDE 0.9 % IJ SOLN
10.0000 mL | INTRAMUSCULAR | Status: DC | PRN
  Administered 2014-08-22: 10 mL
  Filled 2014-08-22: qty 10

## 2014-08-22 MED ORDER — DIPHENHYDRAMINE HCL 25 MG PO CAPS
50.0000 mg | ORAL_CAPSULE | Freq: Once | ORAL | Status: AC
  Administered 2014-08-22: 50 mg via ORAL

## 2014-08-22 NOTE — Patient Instructions (Signed)
[facility] Discharge Instructions for Patients Receiving Chemotherapy  Today you received the following chemotherapy agents: Herceptin, ABRAXANE.  To help prevent nausea and vomiting after your treatment, we encourage you to take your nausea medication as prescribed.   If you develop nausea and vomiting that is not controlled by your nausea medication, call the clinic.   BELOW ARE SYMPTOMS THAT SHOULD BE REPORTED IMMEDIATELY:  *FEVER GREATER THAN 100.5 F  *CHILLS WITH OR WITHOUT FEVER  NAUSEA AND VOMITING THAT IS NOT CONTROLLED WITH YOUR NAUSEA MEDICATION  *UNUSUAL SHORTNESS OF BREATH  *UNUSUAL BRUISING OR BLEEDING  TENDERNESS IN MOUTH AND THROAT WITH OR WITHOUT PRESENCE OF ULCERS  *URINARY PROBLEMS  *BOWEL PROBLEMS  UNUSUAL RASH Items with * indicate a potential emergency and should be followed up as soon as possible.  Feel free to call the clinic you have any questions or concerns. The clinic phone number is [phone].  Please show the [place] at check-in to the Emergency Department and triage nurse.

## 2014-08-27 ENCOUNTER — Emergency Department (HOSPITAL_COMMUNITY)
Admission: EM | Admit: 2014-08-27 | Discharge: 2014-08-27 | Disposition: A | Discharge status: Home / Self Care | Payer: 59 | Attending: Emergency Medicine | Admitting: Emergency Medicine

## 2014-08-27 ENCOUNTER — Encounter (HOSPITAL_COMMUNITY): Payer: Self-pay | Admitting: Emergency Medicine

## 2014-08-27 ENCOUNTER — Emergency Department (HOSPITAL_COMMUNITY): Payer: 59

## 2014-08-27 DIAGNOSIS — I10 Essential (primary) hypertension: Secondary | ICD-10-CM | POA: Insufficient documentation

## 2014-08-27 DIAGNOSIS — Y9389 Activity, other specified: Secondary | ICD-10-CM | POA: Diagnosis not present

## 2014-08-27 DIAGNOSIS — Z79899 Other long term (current) drug therapy: Secondary | ICD-10-CM | POA: Diagnosis not present

## 2014-08-27 DIAGNOSIS — Y92410 Unspecified street and highway as the place of occurrence of the external cause: Secondary | ICD-10-CM | POA: Diagnosis not present

## 2014-08-27 DIAGNOSIS — Z853 Personal history of malignant neoplasm of breast: Secondary | ICD-10-CM | POA: Insufficient documentation

## 2014-08-27 DIAGNOSIS — Y998 Other external cause status: Secondary | ICD-10-CM | POA: Insufficient documentation

## 2014-08-27 DIAGNOSIS — S161XXA Strain of muscle, fascia and tendon at neck level, initial encounter: Secondary | ICD-10-CM | POA: Diagnosis not present

## 2014-08-27 DIAGNOSIS — S199XXA Unspecified injury of neck, initial encounter: Secondary | ICD-10-CM | POA: Diagnosis present

## 2014-08-27 MED ORDER — ACETAMINOPHEN 325 MG PO TABS
650.0000 mg | ORAL_TABLET | Freq: Once | ORAL | Status: AC
  Administered 2014-08-27: 650 mg via ORAL
  Filled 2014-08-27: qty 2
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
## 2014-08-27 NOTE — Discharge Instructions (Signed)
If you were given medicines take as directed.  If you are on coumadin or contraceptives realize their levels and effectiveness is altered by many different medicines.  If you have any reaction (rash, tongues swelling, other) to the medicines stop taking and see a physician.   Please follow up as directed and return to the ER or see a physician for new or worsening symptoms.  Thank you. There were no vitals filed for this visit.
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
## 2014-08-27 NOTE — ED Notes (Signed)
Bed: WA01 Expected date:  Expected time:  Means of arrival:  Comments: EMS 

## 2014-08-28 NOTE — Assessment & Plan Note (Signed)
Right breast lumpectomy 06/18/2014: Invasive ductal carcinoma 1.3 cm negative for LVI; DCIS 1 mm from margins, 1 sentinel node negative, grade 3 ER 95%, PR 77%, HER-2 amplified ratio 8.25, Ki-67 43% T1 cN0 M0 stage IA  Treatment plan: Abraxane and Herceptin weekly 12 started 07/18/2014 followed by Herceptin every 3 weeks for 1 year, after 12 weeks of chemotherapy and radiation therapy, after radiation is complete antiestrogen therapy with tamoxifen 20 mg 10 years  Current treatment: Week 7/12 Abraxane Herceptin Chemotherapy toxicities:  Patient denies any nausea vomiting diarrhea constipation or any side effects to chemotherapy.  Chemotherapy monitoring: Echocardiogram was done 06/13/2014 showing EF 55-60% Return to clinic in 2 weeks for clinical follow-up and weekly for chemotherapy.

## 2014-08-29 ENCOUNTER — Telehealth: Payer: Self-pay | Admitting: Hematology and Oncology

## 2014-08-29 ENCOUNTER — Other Ambulatory Visit (HOSPITAL_BASED_OUTPATIENT_CLINIC_OR_DEPARTMENT_OTHER): Payer: 59

## 2014-08-29 ENCOUNTER — Ambulatory Visit (HOSPITAL_BASED_OUTPATIENT_CLINIC_OR_DEPARTMENT_OTHER): Payer: 59 | Admitting: Hematology and Oncology

## 2014-08-29 ENCOUNTER — Ambulatory Visit (HOSPITAL_BASED_OUTPATIENT_CLINIC_OR_DEPARTMENT_OTHER): Payer: 59

## 2014-08-29 VITALS — BP 129/80 | HR 97 | Temp 98.1°F | Resp 18 | Ht 65.0 in | Wt 242.5 lb

## 2014-08-29 DIAGNOSIS — Z5111 Encounter for antineoplastic chemotherapy: Secondary | ICD-10-CM

## 2014-08-29 DIAGNOSIS — C50311 Malignant neoplasm of lower-inner quadrant of right female breast: Secondary | ICD-10-CM

## 2014-08-29 DIAGNOSIS — Z5112 Encounter for antineoplastic immunotherapy: Secondary | ICD-10-CM | POA: Diagnosis not present

## 2014-08-29 LAB — COMPREHENSIVE METABOLIC PANEL (CC13)
ALT: 30 U/L (ref 0–55)
AST: 34 U/L (ref 5–34)
Albumin: 3.2 g/dL — ABNORMAL LOW (ref 3.5–5.0)
Alkaline Phosphatase: 66 U/L (ref 40–150)
Anion Gap: 10 mEq/L (ref 3–11)
BUN: 12.3 mg/dL (ref 7.0–26.0)
CO2: 27 mEq/L (ref 22–29)
Calcium: 8.8 mg/dL (ref 8.4–10.4)
Chloride: 106 mEq/L (ref 98–109)
Creatinine: 1 mg/dL (ref 0.6–1.1)
EGFR: 83 mL/min/{1.73_m2} — ABNORMAL LOW (ref >90)
Glucose: 104 mg/dl (ref 70–140)
Potassium: 3.7 meq/L (ref 3.5–5.1)
Sodium: 142 mEq/L (ref 136–145)
Total Bilirubin: 0.25 mg/dL (ref 0.20–1.20)
Total Protein: 6.5 g/dL (ref 6.4–8.3)

## 2014-08-29 LAB — CBC WITH DIFFERENTIAL/PLATELET
BASO%: 0.5 % (ref 0.0–2.0)
Basophils Absolute: 0 10*3/uL (ref 0.0–0.1)
EOS%: 1.4 % (ref 0.0–7.0)
Eosinophils Absolute: 0.1 10*3/uL (ref 0.0–0.5)
HCT: 32.6 % — ABNORMAL LOW (ref 34.8–46.6)
HGB: 11 g/dL — ABNORMAL LOW (ref 11.6–15.9)
LYMPH%: 35.4 % (ref 14.0–49.7)
MCH: 32.3 pg (ref 25.1–34.0)
MCHC: 33.7 g/dL (ref 31.5–36.0)
MCV: 95.6 fL (ref 79.5–101.0)
MONO#: 0.5 10*3/uL (ref 0.1–0.9)
MONO%: 7.4 % (ref 0.0–14.0)
NEUT#: 4 10*3/uL (ref 1.5–6.5)
NEUT%: 55.3 % (ref 38.4–76.8)
Platelets: 445 10*3/uL — ABNORMAL HIGH (ref 145–400)
RBC: 3.41 10*6/uL — ABNORMAL LOW (ref 3.70–5.45)
RDW: 15 % — ABNORMAL HIGH (ref 11.2–14.5)
WBC: 7.3 10*3/uL (ref 3.9–10.3)
lymph#: 2.6 10e3/uL (ref 0.9–3.3)

## 2014-08-29 MED ORDER — SODIUM CHLORIDE 0.9 % IV SOLN
Freq: Once | INTRAVENOUS | Status: AC
  Administered 2014-08-29: 10:00:00 via INTRAVENOUS

## 2014-08-29 MED ORDER — DIPHENHYDRAMINE HCL 25 MG PO CAPS
50.0000 mg | ORAL_CAPSULE | Freq: Once | ORAL | Status: AC
  Administered 2014-08-29: 50 mg via ORAL

## 2014-08-29 MED ORDER — ACETAMINOPHEN 325 MG PO TABS
650.0000 mg | ORAL_TABLET | Freq: Once | ORAL | Status: AC
  Administered 2014-08-29: 650 mg via ORAL

## 2014-08-29 MED ORDER — TRASTUZUMAB CHEMO INJECTION 440 MG
2.0000 mg/kg | Freq: Once | INTRAVENOUS | Status: AC
  Administered 2014-08-29: 210 mg via INTRAVENOUS
  Filled 2014-08-29: qty 10

## 2014-08-29 MED ORDER — HEPARIN SOD (PORK) LOCK FLUSH 100 UNIT/ML IV SOLN
500.0000 [IU] | Freq: Once | INTRAVENOUS | Status: AC | PRN
  Administered 2014-08-29: 500 [IU]
  Filled 2014-08-29: qty 5

## 2014-08-29 MED ORDER — PACLITAXEL PROTEIN-BOUND CHEMO INJECTION 100 MG
100.0000 mg/m2 | Freq: Once | INTRAVENOUS | Status: AC
  Administered 2014-08-29: 225 mg via INTRAVENOUS
  Filled 2014-08-29: qty 45

## 2014-08-29 MED ORDER — ACETAMINOPHEN 325 MG PO TABS
ORAL_TABLET | ORAL | Status: AC
  Filled 2014-08-29: qty 2

## 2014-08-29 MED ORDER — SODIUM CHLORIDE 0.9 % IJ SOLN
10.0000 mL | INTRAMUSCULAR | Status: DC | PRN
  Administered 2014-08-29: 10 mL
  Filled 2014-08-29: qty 10

## 2014-08-29 MED ORDER — DIPHENHYDRAMINE HCL 25 MG PO CAPS
ORAL_CAPSULE | ORAL | Status: AC
  Filled 2014-08-29: qty 2

## 2014-08-29 MED ORDER — SODIUM CHLORIDE 0.9 % IV SOLN
Freq: Once | INTRAVENOUS | Status: AC
  Administered 2014-08-29: 10:00:00 via INTRAVENOUS
  Filled 2014-08-29: qty 4
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
## 2014-08-29 NOTE — Telephone Encounter (Signed)
Appointments made and avs printed for patient °

## 2014-08-29 NOTE — Patient Instructions (Signed)
[facility] Discharge Instructions for Patients Receiving Chemotherapy  Today you received the following chemotherapy agents Herceptin and Abraxane.  To help prevent nausea and vomiting after your treatment, we encourage you to take your nausea medication as prescribed.   If you develop nausea and vomiting that is not controlled by your nausea medication, call the clinic.   BELOW ARE SYMPTOMS THAT SHOULD BE REPORTED IMMEDIATELY:  *FEVER GREATER THAN 100.5 F  *CHILLS WITH OR WITHOUT FEVER  NAUSEA AND VOMITING THAT IS NOT CONTROLLED WITH YOUR NAUSEA MEDICATION  *UNUSUAL SHORTNESS OF BREATH  *UNUSUAL BRUISING OR BLEEDING  TENDERNESS IN MOUTH AND THROAT WITH OR WITHOUT PRESENCE OF ULCERS  *URINARY PROBLEMS  *BOWEL PROBLEMS  UNUSUAL RASH Items with * indicate a potential emergency and should be followed up as soon as possible.  Feel free to call the clinic you have any questions or concerns. The clinic phone number is [phone].  Please show the [facility] at check-in to the Emergency Department and triage nurse.

## 2014-09-04 ENCOUNTER — Encounter: Payer: Self-pay | Admitting: Podiatry

## 2014-09-04 ENCOUNTER — Ambulatory Visit (INDEPENDENT_AMBULATORY_CARE_PROVIDER_SITE_OTHER): Payer: 59 | Admitting: Podiatry

## 2014-09-04 VITALS — BP 180/75 | HR 88 | Resp 16

## 2014-09-04 DIAGNOSIS — M779 Enthesopathy, unspecified: Principal | ICD-10-CM

## 2014-09-04 DIAGNOSIS — M71571 Other bursitis, not elsewhere classified, right ankle and foot: Secondary | ICD-10-CM | POA: Diagnosis not present

## 2014-09-04 DIAGNOSIS — G5792 Unspecified mononeuropathy of left lower limb: Secondary | ICD-10-CM | POA: Diagnosis not present

## 2014-09-04 DIAGNOSIS — M7751 Other enthesopathy of right foot: Secondary | ICD-10-CM

## 2014-09-04 DIAGNOSIS — M778 Other enthesopathies, not elsewhere classified: Secondary | ICD-10-CM

## 2014-09-04 NOTE — Progress Notes (Signed)
She presents today for follow-up of her bursitis fifth metatarsophalangeal joint right foot which she states is doing moderately better. She is also stating that her left heel is doing better. She also picks up her orthotics today.  Objective: Vital signs are stable she is alert and oriented 3 moderate tenderness on palpation of the fifth metatarsophalangeal joint and on end range of motion. Obviously an underlying bursa here which is palpable. She has pain on palpation medial calcaneal tubercle of the left heel and the plantar central calcaneal tubercle left.  Assessment: Bursitis fifth metatarsophalangeal joint right foot Baxter's neuritis left heel.  Plan: Patient orthotics today they were trimmed and provided

## 2014-09-04 NOTE — Patient Instructions (Signed)

## 2014-09-05 ENCOUNTER — Ambulatory Visit (HOSPITAL_BASED_OUTPATIENT_CLINIC_OR_DEPARTMENT_OTHER): Payer: 59

## 2014-09-05 ENCOUNTER — Other Ambulatory Visit (HOSPITAL_BASED_OUTPATIENT_CLINIC_OR_DEPARTMENT_OTHER): Payer: 59

## 2014-09-05 VITALS — BP 139/85 | HR 96 | Temp 98.6°F | Resp 18

## 2014-09-05 DIAGNOSIS — Z5111 Encounter for antineoplastic chemotherapy: Secondary | ICD-10-CM | POA: Diagnosis not present

## 2014-09-05 DIAGNOSIS — C50311 Malignant neoplasm of lower-inner quadrant of right female breast: Secondary | ICD-10-CM

## 2014-09-05 DIAGNOSIS — Z5112 Encounter for antineoplastic immunotherapy: Secondary | ICD-10-CM

## 2014-09-05 LAB — CBC WITH DIFFERENTIAL/PLATELET
BASO%: 0.5 % (ref 0.0–2.0)
Basophils Absolute: 0.1 10*3/uL (ref 0.0–0.1)
EOS%: 1 % (ref 0.0–7.0)
Eosinophils Absolute: 0.1 10e3/uL (ref 0.0–0.5)
HCT: 32.5 % — ABNORMAL LOW (ref 34.8–46.6)
HGB: 10.9 g/dL — ABNORMAL LOW (ref 11.6–15.9)
LYMPH%: 36 % (ref 14.0–49.7)
MCH: 32.2 pg (ref 25.1–34.0)
MCHC: 33.5 g/dL (ref 31.5–36.0)
MCV: 96.2 fL (ref 79.5–101.0)
MONO#: 0.9 10*3/uL (ref 0.1–0.9)
MONO%: 9.3 % (ref 0.0–14.0)
NEUT#: 4.8 10*3/uL (ref 1.5–6.5)
NEUT%: 53.2 % (ref 38.4–76.8)
Platelets: 444 10*3/uL — ABNORMAL HIGH (ref 145–400)
RBC: 3.38 10*6/uL — ABNORMAL LOW (ref 3.70–5.45)
RDW: 15.3 % — ABNORMAL HIGH (ref 11.2–14.5)
WBC: 9.1 10e3/uL (ref 3.9–10.3)
lymph#: 3.3 10*3/uL (ref 0.9–3.3)

## 2014-09-05 LAB — COMPREHENSIVE METABOLIC PANEL (CC13)
ALT: 30 U/L (ref 0–55)
AST: 25 U/L (ref 5–34)
Albumin: 3.3 g/dL — ABNORMAL LOW (ref 3.5–5.0)
Alkaline Phosphatase: 67 U/L (ref 40–150)
Anion Gap: 11 mEq/L (ref 3–11)
BUN: 12.8 mg/dL (ref 7.0–26.0)
CO2: 24 mEq/L (ref 22–29)
Calcium: 8.8 mg/dL (ref 8.4–10.4)
Chloride: 106 mEq/L (ref 98–109)
Creatinine: 1 mg/dL (ref 0.6–1.1)
EGFR: 84 mL/min/{1.73_m2} — ABNORMAL LOW (ref >90)
Glucose: 85 mg/dL (ref 70–140)
Potassium: 3.7 meq/L (ref 3.5–5.1)
Sodium: 142 mEq/L (ref 136–145)
Total Bilirubin: 0.32 mg/dL (ref 0.20–1.20)
Total Protein: 6.8 g/dL (ref 6.4–8.3)

## 2014-09-05 MED ORDER — ACETAMINOPHEN 325 MG PO TABS
ORAL_TABLET | ORAL | Status: AC
  Filled 2014-09-05: qty 2

## 2014-09-05 MED ORDER — DIPHENHYDRAMINE HCL 25 MG PO CAPS
ORAL_CAPSULE | ORAL | Status: AC
  Filled 2014-09-05: qty 2

## 2014-09-05 MED ORDER — TRASTUZUMAB CHEMO INJECTION 440 MG
2.0000 mg/kg | Freq: Once | INTRAVENOUS | Status: AC
  Administered 2014-09-05: 210 mg via INTRAVENOUS
  Filled 2014-09-05: qty 10

## 2014-09-05 MED ORDER — SODIUM CHLORIDE 0.9 % IV SOLN
Freq: Once | INTRAVENOUS | Status: AC
  Administered 2014-09-05: 10:00:00 via INTRAVENOUS
  Filled 2014-09-05: qty 4

## 2014-09-05 MED ORDER — SODIUM CHLORIDE 0.9 % IV SOLN
Freq: Once | INTRAVENOUS | Status: AC
  Administered 2014-09-05: 10:00:00 via INTRAVENOUS

## 2014-09-05 MED ORDER — ACETAMINOPHEN 325 MG PO TABS
650.0000 mg | ORAL_TABLET | Freq: Once | ORAL | Status: AC
  Administered 2014-09-05: 650 mg via ORAL

## 2014-09-05 MED ORDER — DIPHENHYDRAMINE HCL 25 MG PO CAPS
50.0000 mg | ORAL_CAPSULE | Freq: Once | ORAL | Status: AC
  Administered 2014-09-05: 50 mg via ORAL

## 2014-09-05 MED ORDER — PACLITAXEL PROTEIN-BOUND CHEMO INJECTION 100 MG
100.0000 mg/m2 | Freq: Once | INTRAVENOUS | Status: AC
  Administered 2014-09-05: 225 mg via INTRAVENOUS
  Filled 2014-09-05: qty 45

## 2014-09-05 MED ORDER — SODIUM CHLORIDE 0.9 % IJ SOLN
10.0000 mL | INTRAMUSCULAR | Status: DC | PRN
  Administered 2014-09-05: 10 mL
  Filled 2014-09-05: qty 10

## 2014-09-05 MED ORDER — HEPARIN SOD (PORK) LOCK FLUSH 100 UNIT/ML IV SOLN
500.0000 [IU] | Freq: Once | INTRAVENOUS | Status: AC | PRN
  Administered 2014-09-05: 500 [IU]
  Filled 2014-09-05: qty 5

## 2014-09-05 NOTE — Patient Instructions (Signed)
[facility] Discharge Instructions for Patients Receiving Chemotherapy  Today you received the following chemotherapy agents Herceptin and Abraxane  To help prevent nausea and vomiting after your treatment, we encourage you to take your nausea medication Compazine 10 mg every 6 hours as needed.  If you develop nausea and vomiting that is not controlled by your nausea medication, call the clinic.   BELOW ARE SYMPTOMS THAT SHOULD BE REPORTED IMMEDIATELY:  *FEVER GREATER THAN 100.5 F  *CHILLS WITH OR WITHOUT FEVER  NAUSEA AND VOMITING THAT IS NOT CONTROLLED WITH YOUR NAUSEA MEDICATION  *UNUSUAL SHORTNESS OF BREATH  *UNUSUAL BRUISING OR BLEEDING  TENDERNESS IN MOUTH AND THROAT WITH OR WITHOUT PRESENCE OF ULCERS  *URINARY PROBLEMS  *BOWEL PROBLEMS  UNUSUAL RASH Items with * indicate a potential emergency and should be followed up as soon as possible.  Feel free to call the clinic you have any questions or concerns. The clinic phone number is [phone].  Please show the [place] [facility] at check-in to the Emergency Department and triage nurse.

## 2014-09-12 ENCOUNTER — Other Ambulatory Visit: Payer: Self-pay | Admitting: *Deleted

## 2014-09-12 ENCOUNTER — Other Ambulatory Visit: Payer: 59

## 2014-09-12 ENCOUNTER — Ambulatory Visit (HOSPITAL_BASED_OUTPATIENT_CLINIC_OR_DEPARTMENT_OTHER): Payer: 59 | Admitting: Hematology and Oncology

## 2014-09-12 ENCOUNTER — Other Ambulatory Visit (HOSPITAL_BASED_OUTPATIENT_CLINIC_OR_DEPARTMENT_OTHER): Payer: 59

## 2014-09-12 ENCOUNTER — Telehealth: Payer: Self-pay | Admitting: Nurse Practitioner

## 2014-09-12 ENCOUNTER — Ambulatory Visit (HOSPITAL_BASED_OUTPATIENT_CLINIC_OR_DEPARTMENT_OTHER): Payer: 59

## 2014-09-12 VITALS — BP 135/85 | HR 88 | Temp 97.0°F | Resp 18 | Ht 65.0 in | Wt 240.7 lb

## 2014-09-12 DIAGNOSIS — C50311 Malignant neoplasm of lower-inner quadrant of right female breast: Secondary | ICD-10-CM

## 2014-09-12 DIAGNOSIS — Z5111 Encounter for antineoplastic chemotherapy: Secondary | ICD-10-CM

## 2014-09-12 DIAGNOSIS — G629 Polyneuropathy, unspecified: Secondary | ICD-10-CM

## 2014-09-12 DIAGNOSIS — D649 Anemia, unspecified: Secondary | ICD-10-CM

## 2014-09-12 DIAGNOSIS — L659 Nonscarring hair loss, unspecified: Secondary | ICD-10-CM | POA: Diagnosis not present

## 2014-09-12 DIAGNOSIS — Z5112 Encounter for antineoplastic immunotherapy: Secondary | ICD-10-CM

## 2014-09-12 LAB — COMPREHENSIVE METABOLIC PANEL (CC13)
ALT: 32 U/L (ref 0–55)
AST: 28 U/L (ref 5–34)
Albumin: 3.4 g/dL — ABNORMAL LOW (ref 3.5–5.0)
Alkaline Phosphatase: 61 U/L (ref 40–150)
Anion Gap: 12 mEq/L — ABNORMAL HIGH (ref 3–11)
BUN: 14.3 mg/dL (ref 7.0–26.0)
CO2: 26 meq/L (ref 22–29)
Calcium: 8.6 mg/dL (ref 8.4–10.4)
Chloride: 105 meq/L (ref 98–109)
Creatinine: 1 mg/dL (ref 0.6–1.1)
EGFR: 83 mL/min/{1.73_m2} — ABNORMAL LOW (ref >90)
Glucose: 98 mg/dl (ref 70–140)
Potassium: 3.7 mEq/L (ref 3.5–5.1)
Sodium: 142 mEq/L (ref 136–145)
Total Bilirubin: 0.35 mg/dL (ref 0.20–1.20)
Total Protein: 6.9 g/dL (ref 6.4–8.3)

## 2014-09-12 LAB — CBC WITH DIFFERENTIAL/PLATELET
BASO%: 1 % (ref 0.0–2.0)
Basophils Absolute: 0.1 10e3/uL (ref 0.0–0.1)
EOS%: 1.5 % (ref 0.0–7.0)
Eosinophils Absolute: 0.1 10e3/uL (ref 0.0–0.5)
HCT: 32.6 % — ABNORMAL LOW (ref 34.8–46.6)
HGB: 11.2 g/dL — ABNORMAL LOW (ref 11.6–15.9)
LYMPH%: 31.4 % (ref 14.0–49.7)
MCH: 32.6 pg (ref 25.1–34.0)
MCHC: 34.4 g/dL (ref 31.5–36.0)
MCV: 94.5 fL (ref 79.5–101.0)
MONO#: 0.7 10*3/uL (ref 0.1–0.9)
MONO%: 9.1 % (ref 0.0–14.0)
NEUT#: 4.3 10*3/uL (ref 1.5–6.5)
NEUT%: 57 % (ref 38.4–76.8)
Platelets: 471 10e3/uL — ABNORMAL HIGH (ref 145–400)
RBC: 3.45 10*6/uL — ABNORMAL LOW (ref 3.70–5.45)
RDW: 16.1 % — ABNORMAL HIGH (ref 11.2–14.5)
WBC: 7.6 10*3/uL (ref 3.9–10.3)
lymph#: 2.4 10*3/uL (ref 0.9–3.3)

## 2014-09-12 MED ORDER — TRASTUZUMAB CHEMO INJECTION 440 MG
2.0000 mg/kg | Freq: Once | INTRAVENOUS | Status: AC
  Administered 2014-09-12: 210 mg via INTRAVENOUS
  Filled 2014-09-12: qty 10

## 2014-09-12 MED ORDER — DIPHENHYDRAMINE HCL 25 MG PO CAPS
50.0000 mg | ORAL_CAPSULE | Freq: Once | ORAL | Status: AC
  Administered 2014-09-12: 50 mg via ORAL

## 2014-09-12 MED ORDER — ACETAMINOPHEN 325 MG PO TABS
650.0000 mg | ORAL_TABLET | Freq: Once | ORAL | Status: AC
  Administered 2014-09-12: 650 mg via ORAL

## 2014-09-12 MED ORDER — SODIUM CHLORIDE 0.9 % IJ SOLN
10.0000 mL | INTRAMUSCULAR | Status: DC | PRN
  Administered 2014-09-12: 10 mL
  Filled 2014-09-12: qty 10

## 2014-09-12 MED ORDER — HEPARIN SOD (PORK) LOCK FLUSH 100 UNIT/ML IV SOLN
500.0000 [IU] | Freq: Once | INTRAVENOUS | Status: AC | PRN
  Administered 2014-09-12: 500 [IU]
  Filled 2014-09-12: qty 5

## 2014-09-12 MED ORDER — PROCHLORPERAZINE MALEATE 10 MG PO TABS: 10.0000 mg | ORAL_TABLET | Freq: Four times a day (QID) | ORAL | Status: DC | PRN

## 2014-09-12 MED ORDER — SODIUM CHLORIDE 0.9 % IV SOLN
Freq: Once | INTRAVENOUS | Status: AC
  Administered 2014-09-12: 10:00:00 via INTRAVENOUS

## 2014-09-12 MED ORDER — SODIUM CHLORIDE 0.9 % IV SOLN
Freq: Once | INTRAVENOUS | Status: AC
  Administered 2014-09-12: 10:00:00 via INTRAVENOUS
  Filled 2014-09-12: qty 4

## 2014-09-12 MED ORDER — PACLITAXEL PROTEIN-BOUND CHEMO INJECTION 100 MG
100.0000 mg/m2 | Freq: Once | INTRAVENOUS | Status: AC
  Administered 2014-09-12: 225 mg via INTRAVENOUS
  Filled 2014-09-12: qty 45

## 2014-09-12 MED ORDER — DIPHENHYDRAMINE HCL 25 MG PO CAPS
ORAL_CAPSULE | ORAL | Status: AC
  Filled 2014-09-12: qty 2

## 2014-09-12 MED ORDER — ACETAMINOPHEN 325 MG PO TABS
ORAL_TABLET | ORAL | Status: AC
  Filled 2014-09-12: qty 2

## 2014-09-12 NOTE — Assessment & Plan Note (Signed)
Right breast lumpectomy 06/18/2014: Invasive ductal carcinoma 1.3 cm negative for LVI; DCIS 1 mm from margins, 1 sentinel node negative, grade 3 ER 95%, PR 77%, HER-2 amplified ratio 8.25, Ki-67 43% T1 cN0 M0 stage IA  Treatment plan: Abraxane and Herceptin weekly 12 started 07/18/2014 followed by Herceptin every 3 weeks for 1 year, after 12 weeks of chemotherapy and radiation therapy, after radiation is complete antiestrogen therapy with tamoxifen 20 mg 10 years  Current treatment: Week 9/12 Abraxane Herceptin Chemotherapy toxicities:  Patient denies any nausea vomiting diarrhea constipation or any side effects to chemotherapy.  Chemotherapy monitoring: Echocardiogram was done 06/13/2014 showing EF 55-60% Return to clinic in 2 weeks for clinical follow-up and weekly for chemotherapy.     

## 2014-09-12 NOTE — Patient Instructions (Signed)
[facility] Discharge Instructions for Patients Receiving Chemotherapy  Today you received the following chemotherapy agents Herceptin and Abraxane  To help prevent nausea and vomiting after your treatment, we encourage you to take your nausea medication Compazine 10 mg every 6 hours as needed.  If you develop nausea and vomiting that is not controlled by your nausea medication, call the clinic.   BELOW ARE SYMPTOMS THAT SHOULD BE REPORTED IMMEDIATELY:  *FEVER GREATER THAN 100.5 F  *CHILLS WITH OR WITHOUT FEVER  NAUSEA AND VOMITING THAT IS NOT CONTROLLED WITH YOUR NAUSEA MEDICATION  *UNUSUAL SHORTNESS OF BREATH  *UNUSUAL BRUISING OR BLEEDING  TENDERNESS IN MOUTH AND THROAT WITH OR WITHOUT PRESENCE OF ULCERS  *URINARY PROBLEMS  *BOWEL PROBLEMS  UNUSUAL RASH Items with * indicate a potential emergency and should be followed up as soon as possible.  Feel free to call the clinic you have any questions or concerns. The clinic phone number is [phone].  Please show the [facility] at check-in to the Emergency Department and triage nurse.

## 2014-09-12 NOTE — Progress Notes (Signed)
Patient Care Team: Albina Billet, MD as PCP - General (Internal Medicine) Albina Billet, MD (Internal Medicine)  DIAGNOSIS: No matching staging information was found for the patient.  SUMMARY OF ONCOLOGIC HISTORY:   Breast cancer of lower-inner quadrant of right female breast   05/16/2014 Initial Diagnosis Ultrasound-guided biopsy: Invasive ductal carcinoma with DCIS, grade 1-2, ER/PR positive, Ki-67 43%, HER-2 positive   05/28/2014 Breast MRI Right breast: 1.4 x 1.2 x 1 cm oval enhancing mass lower inner quadrant   06/18/2014 Surgery Right breast lumpectomy: Invasive ductal carcinoma 1.3 cm negative for LVI; DCIS 1 mm from margins, 1 sentinel node negative, grade 3 ER 95%, PR 77%, HER-2 amplified ratio 8.25, Ki-67 43% T1 cN0 M0 stage IA   07/18/2014 -  Chemotherapy Abraxane Herceptin weekly 12 followed by Herceptin maintenance every 3 weeks    CHIEF COMPLIANT: Abraxane Herceptin weekly 9/12  INTERVAL HISTORY: Krystal Kelley is a 44 year old with above-mentioned history of right breast cancer currently on adjuvant chemotherapy with Abraxane and Herceptin weekly. She's been tolerating chemotherapy fairly well she has noticed minimal numbness in the left tips of the fingers especially the thumb and index finger. She does not benefit his carpal tunnel acting out or it is neuropathy related to Abraxane. She had lost some taste buds recently and could not taste certain foods or other food still tastes good.  REVIEW OF SYSTEMS:   Constitutional: Denies fevers, chills or abnormal weight loss Eyes: Denies blurriness of vision Ears, nose, mouth, throat, and face: Denies mucositis or sore throat Respiratory: Denies cough, dyspnea or wheezes Cardiovascular: Denies palpitation, chest discomfort or lower extremity swelling Gastrointestinal:  Denies nausea, heartburn or change in bowel habits Skin: Denies abnormal skin rashes Lymphatics: Denies new lymphadenopathy or easy bruising Neurological: Tingling and  numbness in the index finger and the thumb of left hand Behavioral/Psych: Mood is stable, no new changes   All other systems were reviewed with the patient and are negative.  I have reviewed the past medical history, past surgical history, social history and family history with the patient and they are unchanged from previous note.  ALLERGIES:  has No Known Allergies.  MEDICATIONS:  Current Outpatient Prescriptions  Medication Sig Dispense Refill  . ALPRAZolam (XANAX) 0.5 MG tablet Take 0.5 mg by mouth 3 (three) times daily as needed for anxiety.   0  . chlorpheniramine-HYDROcodone (TUSSIONEX) 10-8 MG/5ML LQCR Take 5 mLs by mouth every 12 (twelve) hours as needed for cough.   0  . hydrochlorothiazide (HYDRODIURIL) 25 MG tablet Take 25 mg by mouth daily.    . Ketoprofen CR (KETOPROFEN CR) 200 MG CP24 capsule SR 24 hr Take 200 mg by mouth daily.   0  . lidocaine-prilocaine (EMLA) cream Apply 1 application topically as needed. 30 g 0  . loperamide (IMODIUM) 2 MG capsule Take 2 mg by mouth as needed for diarrhea or loose stools.    . ondansetron (ZOFRAN) 8 MG tablet Take 1 tablet (8 mg total) by mouth 2 (two) times daily. Start the day after chemo for 2 days. Then take as needed for nausea or vomiting. 30 tablet 1  . oxyCODONE-acetaminophen (ROXICET) 5-325 MG per tablet Take 1-2 tablets by mouth every 4 (four) hours as needed for severe pain. 40 tablet 0  . prochlorperazine (COMPAZINE) 10 MG tablet Take 1 tablet (10 mg total) by mouth every 6 (six) hours as needed (Nausea or vomiting). 30 tablet 1   No current facility-administered medications for this visit.  PHYSICAL EXAMINATION: ECOG PERFORMANCE STATUS: 1 - Symptomatic but completely ambulatory  Filed Vitals:   09/12/14 0908  BP: 135/85  Pulse: 88  Temp: 97 F (36.1 C)  Resp: 18   Filed Weights   09/12/14 0908  Weight: 240 lb 11.2 oz (109.181 kg)    GENERAL:alert, no distress and comfortable SKIN: skin color, texture,  turgor are normal, no rashes or significant lesions EYES: normal, Conjunctiva are pink and non-injected, sclera clear OROPHARYNX:no exudate, no erythema and lips, buccal mucosa, and tongue normal  NECK: supple, thyroid normal size, non-tender, without nodularity LYMPH:  no palpable lymphadenopathy in the cervical, axillary or inguinal LUNGS: clear to auscultation and percussion with normal breathing effort HEART: regular rate & rhythm and no murmurs and no lower extremity edema ABDOMEN:abdomen soft, non-tender and normal bowel sounds Musculoskeletal:no cyanosis of digits and no clubbing  NEURO: Neuropathy grade 1   LABORATORY DATA:  I have reviewed the data as listed   Chemistry      Component Value Date/Time   NA 142 09/05/2014 0856   NA 140 08/01/2014 0918   NA 135* 06/11/2013 0408   K 3.7 09/05/2014 0856   K 4.0 08/01/2014 0918   K 3.2* 06/11/2013 0408   CL 107 08/01/2014 0918   CL 102 06/11/2013 0408   CO2 24 09/05/2014 0856   CO2 28 08/01/2014 0918   CO2 26 06/11/2013 0408   BUN 12.8 09/05/2014 0856   BUN 12 08/01/2014 0918   BUN 15 06/11/2013 0408   CREATININE 1.0 09/05/2014 0856   CREATININE 0.93 08/01/2014 0918   CREATININE 1.16 06/11/2013 0408      Component Value Date/Time   CALCIUM 8.8 09/05/2014 0856   CALCIUM 8.8 08/01/2014 0918   CALCIUM 8.7 06/11/2013 0408   ALKPHOS 67 09/05/2014 0856   ALKPHOS 54 08/01/2014 0918   ALKPHOS 75 06/12/2013 1541   AST 25 09/05/2014 0856   AST 41* 08/01/2014 0918   AST 137* 06/12/2013 1541   ALT 30 09/05/2014 0856   ALT 54* 08/01/2014 0918   ALT 192* 06/12/2013 1541   BILITOT 0.32 09/05/2014 0856   BILITOT 0.2* 08/01/2014 0918       Lab Results  Component Value Date   WBC 7.6 09/12/2014   HGB 11.2* 09/12/2014   HCT 32.6* 09/12/2014   MCV 94.5 09/12/2014   PLT 471* 09/12/2014   NEUTROABS 4.3 09/12/2014   ASSESSMENT & PLAN:  Breast cancer of lower-inner quadrant of right female breast Right breast lumpectomy  06/18/2014: Invasive ductal carcinoma 1.3 cm negative for LVI; DCIS 1 mm from margins, 1 sentinel node negative, grade 3 ER 95%, PR 77%, HER-2 amplified ratio 8.25, Ki-67 43% T1 cN0 M0 stage IA  Treatment plan: Abraxane and Herceptin weekly 12 started 07/18/2014 followed by Herceptin every 3 weeks for 1 year, after 12 weeks of chemotherapy and radiation therapy, after radiation is complete antiestrogen therapy with tamoxifen 20 mg 10 years  Current treatment: Week 9/12 Abraxane Herceptin Chemotherapy toxicities:  1. Neuropathy grade 1: Will be watching and monitoring it 2. Anemia grade 1 3. Alopecia  Patient denies any nausea vomiting diarrhea constipation or any side effects to chemotherapy.  Chemotherapy monitoring: Echocardiogram was done 06/13/2014 showing EF 55-60% Return to clinic in 2 weeks for clinical follow-up and weekly for chemotherapy.  No orders of the defined types were placed in this encounter.   The patient has a good understanding of the overall plan. she agrees with it. she will call with any  problems that may develop before the next visit here.   Rulon Eisenmenger, MD

## 2014-09-12 NOTE — Telephone Encounter (Signed)
Appointments added per pof and patient will get a new avs in chemo

## 2014-09-17 ENCOUNTER — Other Ambulatory Visit: Payer: Self-pay

## 2014-09-17 DIAGNOSIS — C50311 Malignant neoplasm of lower-inner quadrant of right female breast: Secondary | ICD-10-CM

## 2014-09-18 ENCOUNTER — Other Ambulatory Visit: Payer: Self-pay | Admitting: *Deleted

## 2014-09-19 ENCOUNTER — Other Ambulatory Visit: Payer: Self-pay | Admitting: *Deleted

## 2014-09-19 ENCOUNTER — Ambulatory Visit (HOSPITAL_BASED_OUTPATIENT_CLINIC_OR_DEPARTMENT_OTHER): Payer: 59

## 2014-09-19 ENCOUNTER — Other Ambulatory Visit (HOSPITAL_BASED_OUTPATIENT_CLINIC_OR_DEPARTMENT_OTHER): Payer: 59

## 2014-09-19 VITALS — BP 134/75 | HR 86 | Temp 98.4°F | Resp 16

## 2014-09-19 DIAGNOSIS — Z5112 Encounter for antineoplastic immunotherapy: Secondary | ICD-10-CM | POA: Diagnosis not present

## 2014-09-19 DIAGNOSIS — C50311 Malignant neoplasm of lower-inner quadrant of right female breast: Secondary | ICD-10-CM | POA: Diagnosis not present

## 2014-09-19 DIAGNOSIS — Z5111 Encounter for antineoplastic chemotherapy: Secondary | ICD-10-CM | POA: Diagnosis not present

## 2014-09-19 LAB — COMPREHENSIVE METABOLIC PANEL (CC13)
ALT: 26 U/L (ref 0–55)
AST: 24 U/L (ref 5–34)
Albumin: 3.3 g/dL — ABNORMAL LOW (ref 3.5–5.0)
Alkaline Phosphatase: 59 U/L (ref 40–150)
Anion Gap: 8 mEq/L (ref 3–11)
BUN: 12.5 mg/dL (ref 7.0–26.0)
CO2: 29 mEq/L (ref 22–29)
Calcium: 8.8 mg/dL (ref 8.4–10.4)
Chloride: 105 mEq/L (ref 98–109)
Creatinine: 1 mg/dL (ref 0.6–1.1)
EGFR: 80 ml/min/1.73 m2 — ABNORMAL LOW (ref >90)
Glucose: 111 mg/dL (ref 70–140)
Potassium: 3.6 mEq/L (ref 3.5–5.1)
Sodium: 142 mEq/L (ref 136–145)
Total Bilirubin: 0.32 mg/dL (ref 0.20–1.20)
Total Protein: 6.7 g/dL (ref 6.4–8.3)

## 2014-09-19 LAB — CBC WITH DIFFERENTIAL/PLATELET
BASO%: 0.5 % (ref 0.0–2.0)
Basophils Absolute: 0 10*3/uL (ref 0.0–0.1)
EOS%: 0.9 % (ref 0.0–7.0)
Eosinophils Absolute: 0.1 10*3/uL (ref 0.0–0.5)
HCT: 32.2 % — ABNORMAL LOW (ref 34.8–46.6)
HGB: 11 g/dL — ABNORMAL LOW (ref 11.6–15.9)
LYMPH%: 35.6 % (ref 14.0–49.7)
MCH: 32.5 pg (ref 25.1–34.0)
MCHC: 34.2 g/dL (ref 31.5–36.0)
MCV: 95.3 fL (ref 79.5–101.0)
MONO#: 0.7 10e3/uL (ref 0.1–0.9)
MONO%: 9.1 % (ref 0.0–14.0)
NEUT#: 4 10*3/uL (ref 1.5–6.5)
NEUT%: 53.9 % (ref 38.4–76.8)
Platelets: 428 10e3/uL — ABNORMAL HIGH (ref 145–400)
RBC: 3.38 10e6/uL — ABNORMAL LOW (ref 3.70–5.45)
RDW: 15.8 % — ABNORMAL HIGH (ref 11.2–14.5)
WBC: 7.5 10e3/uL (ref 3.9–10.3)
lymph#: 2.7 10*3/uL (ref 0.9–3.3)
nRBC: 1 % — ABNORMAL HIGH (ref 0–0)

## 2014-09-19 MED ORDER — TRASTUZUMAB CHEMO INJECTION 440 MG
2.0000 mg/kg | Freq: Once | INTRAVENOUS | Status: AC
  Administered 2014-09-19: 210 mg via INTRAVENOUS
  Filled 2014-09-19: qty 10

## 2014-09-19 MED ORDER — HEPARIN SOD (PORK) LOCK FLUSH 100 UNIT/ML IV SOLN
500.0000 [IU] | Freq: Once | INTRAVENOUS | Status: AC | PRN
  Administered 2014-09-19: 500 [IU]
  Filled 2014-09-19: qty 5

## 2014-09-19 MED ORDER — ACETAMINOPHEN 325 MG PO TABS
ORAL_TABLET | ORAL | Status: AC
  Filled 2014-09-19: qty 2

## 2014-09-19 MED ORDER — DIPHENHYDRAMINE HCL 25 MG PO CAPS
ORAL_CAPSULE | ORAL | Status: AC
  Filled 2014-09-19: qty 2

## 2014-09-19 MED ORDER — ACETAMINOPHEN 325 MG PO TABS
650.0000 mg | ORAL_TABLET | Freq: Once | ORAL | Status: AC
  Administered 2014-09-19: 650 mg via ORAL

## 2014-09-19 MED ORDER — PACLITAXEL PROTEIN-BOUND CHEMO INJECTION 100 MG
100.0000 mg/m2 | Freq: Once | INTRAVENOUS | Status: AC
  Administered 2014-09-19: 225 mg via INTRAVENOUS
  Filled 2014-09-19: qty 45

## 2014-09-19 MED ORDER — SODIUM CHLORIDE 0.9 % IV SOLN
Freq: Once | INTRAVENOUS | Status: AC
  Administered 2014-09-19: 10:00:00 via INTRAVENOUS

## 2014-09-19 MED ORDER — SODIUM CHLORIDE 0.9 % IJ SOLN
10.0000 mL | INTRAMUSCULAR | Status: DC | PRN
  Administered 2014-09-19: 10 mL
  Filled 2014-09-19: qty 10

## 2014-09-19 MED ORDER — DIPHENHYDRAMINE HCL 25 MG PO CAPS
50.0000 mg | ORAL_CAPSULE | Freq: Once | ORAL | Status: AC
  Administered 2014-09-19: 50 mg via ORAL

## 2014-09-19 MED ORDER — ONDANSETRON HCL 40 MG/20ML IJ SOLN
Freq: Once | INTRAMUSCULAR | Status: AC
  Administered 2014-09-19: 10:00:00 via INTRAVENOUS
  Filled 2014-09-19: qty 4

## 2014-09-19 NOTE — Progress Notes (Signed)
OK to treat today VO Dr. Lindi Adie.  Echo to be scheduled.  Pt. aware

## 2014-09-19 NOTE — Patient Instructions (Signed)
[facility] Discharge Instructions for Patients Receiving Chemotherapy  Today you received the following chemotherapy agents Herceptin and Abraxane  To help prevent nausea and vomiting after your treatment, we encourage you to take your nausea medication Compazine 10 mg every 6 hours as needed.  If you develop nausea and vomiting that is not controlled by your nausea medication, call the clinic.   BELOW ARE SYMPTOMS THAT SHOULD BE REPORTED IMMEDIATELY:  *FEVER GREATER THAN 100.5 F  *CHILLS WITH OR WITHOUT FEVER  NAUSEA AND VOMITING THAT IS NOT CONTROLLED WITH YOUR NAUSEA MEDICATION  *UNUSUAL SHORTNESS OF BREATH  *UNUSUAL BRUISING OR BLEEDING  TENDERNESS IN MOUTH AND THROAT WITH OR WITHOUT PRESENCE OF ULCERS  *URINARY PROBLEMS  *BOWEL PROBLEMS  UNUSUAL RASH Items with * indicate a potential emergency and should be followed up as soon as possible.  Feel free to call the clinic you have any questions or concerns. The clinic phone number is [phone].  Please show the [facility] at check-in to the Emergency Department and triage nurse.

## 2014-09-25 ENCOUNTER — Other Ambulatory Visit: Payer: Self-pay | Admitting: *Deleted

## 2014-09-25 ENCOUNTER — Ambulatory Visit: Payer: 59 | Admitting: Podiatry

## 2014-09-26 ENCOUNTER — Other Ambulatory Visit: Payer: 59

## 2014-09-26 ENCOUNTER — Ambulatory Visit (HOSPITAL_BASED_OUTPATIENT_CLINIC_OR_DEPARTMENT_OTHER): Payer: 59

## 2014-09-26 ENCOUNTER — Other Ambulatory Visit (HOSPITAL_BASED_OUTPATIENT_CLINIC_OR_DEPARTMENT_OTHER): Payer: 59

## 2014-09-26 ENCOUNTER — Encounter: Payer: Self-pay | Admitting: Nurse Practitioner

## 2014-09-26 ENCOUNTER — Ambulatory Visit (HOSPITAL_BASED_OUTPATIENT_CLINIC_OR_DEPARTMENT_OTHER): Payer: 59 | Admitting: Nurse Practitioner

## 2014-09-26 ENCOUNTER — Ambulatory Visit: Payer: 59

## 2014-09-26 VITALS — BP 145/87 | HR 91 | Temp 98.3°F | Resp 20 | Wt 243.0 lb

## 2014-09-26 DIAGNOSIS — C50311 Malignant neoplasm of lower-inner quadrant of right female breast: Secondary | ICD-10-CM | POA: Diagnosis not present

## 2014-09-26 DIAGNOSIS — Z5111 Encounter for antineoplastic chemotherapy: Secondary | ICD-10-CM | POA: Diagnosis not present

## 2014-09-26 DIAGNOSIS — G620 Drug-induced polyneuropathy: Secondary | ICD-10-CM

## 2014-09-26 DIAGNOSIS — D6481 Anemia due to antineoplastic chemotherapy: Secondary | ICD-10-CM | POA: Diagnosis not present

## 2014-09-26 DIAGNOSIS — Z5112 Encounter for antineoplastic immunotherapy: Secondary | ICD-10-CM | POA: Diagnosis not present

## 2014-09-26 LAB — CBC WITH DIFFERENTIAL/PLATELET
BASO%: 0.6 % (ref 0.0–2.0)
Basophils Absolute: 0.1 10*3/uL (ref 0.0–0.1)
EOS%: 1.3 % (ref 0.0–7.0)
Eosinophils Absolute: 0.1 10*3/uL (ref 0.0–0.5)
HCT: 31.9 % — ABNORMAL LOW (ref 34.8–46.6)
HGB: 10.9 g/dL — ABNORMAL LOW (ref 11.6–15.9)
LYMPH%: 31.1 % (ref 14.0–49.7)
MCH: 32.5 pg (ref 25.1–34.0)
MCHC: 34.2 g/dL (ref 31.5–36.0)
MCV: 95.2 fL (ref 79.5–101.0)
MONO#: 0.7 10*3/uL (ref 0.1–0.9)
MONO%: 8.6 % (ref 0.0–14.0)
NEUT#: 4.8 10*3/uL (ref 1.5–6.5)
NEUT%: 58.4 % (ref 38.4–76.8)
Platelets: 450 10e3/uL — ABNORMAL HIGH (ref 145–400)
RBC: 3.35 10*6/uL — ABNORMAL LOW (ref 3.70–5.45)
RDW: 15.8 % — ABNORMAL HIGH (ref 11.2–14.5)
WBC: 8.2 10e3/uL (ref 3.9–10.3)
lymph#: 2.6 10*3/uL (ref 0.9–3.3)

## 2014-09-26 LAB — COMPREHENSIVE METABOLIC PANEL (CC13)
ALT: 22 U/L (ref 0–55)
AST: 24 U/L (ref 5–34)
Albumin: 3.2 g/dL — ABNORMAL LOW (ref 3.5–5.0)
Alkaline Phosphatase: 60 U/L (ref 40–150)
Anion Gap: 7 mEq/L (ref 3–11)
BUN: 10.9 mg/dL (ref 7.0–26.0)
CO2: 28 meq/L (ref 22–29)
Calcium: 9.1 mg/dL (ref 8.4–10.4)
Chloride: 105 meq/L (ref 98–109)
Creatinine: 0.9 mg/dL (ref 0.6–1.1)
EGFR: >90 mL/min/{1.73_m2} (ref >90)
Glucose: 99 mg/dL (ref 70–140)
Potassium: 3.7 mEq/L (ref 3.5–5.1)
Sodium: 141 mEq/L (ref 136–145)
Total Bilirubin: 0.34 mg/dL (ref 0.20–1.20)
Total Protein: 6.7 g/dL (ref 6.4–8.3)

## 2014-09-26 MED ORDER — PACLITAXEL PROTEIN-BOUND CHEMO INJECTION 100 MG
100.0000 mg/m2 | Freq: Once | INTRAVENOUS | Status: AC
  Administered 2014-09-26: 225 mg via INTRAVENOUS
  Filled 2014-09-26: qty 45

## 2014-09-26 MED ORDER — ACETAMINOPHEN 325 MG PO TABS
650.0000 mg | ORAL_TABLET | Freq: Once | ORAL | Status: AC
  Administered 2014-09-26: 650 mg via ORAL

## 2014-09-26 MED ORDER — SODIUM CHLORIDE 0.9 % IV SOLN
2.0000 mg/kg | Freq: Once | INTRAVENOUS | Status: AC
  Administered 2014-09-26: 210 mg via INTRAVENOUS
  Filled 2014-09-26: qty 10

## 2014-09-26 MED ORDER — SODIUM CHLORIDE 0.9 % IJ SOLN
10.0000 mL | INTRAMUSCULAR | Status: DC | PRN
  Administered 2014-09-26: 10 mL
  Filled 2014-09-26: qty 10

## 2014-09-26 MED ORDER — ACETAMINOPHEN 325 MG PO TABS
ORAL_TABLET | ORAL | Status: AC
  Filled 2014-09-26: qty 2

## 2014-09-26 MED ORDER — SODIUM CHLORIDE 0.9 % IV SOLN
Freq: Once | INTRAVENOUS | Status: AC
  Administered 2014-09-26: 11:00:00 via INTRAVENOUS

## 2014-09-26 MED ORDER — DIPHENHYDRAMINE HCL 25 MG PO CAPS
ORAL_CAPSULE | ORAL | Status: AC
  Filled 2014-09-26: qty 1

## 2014-09-26 MED ORDER — DIPHENHYDRAMINE HCL 25 MG PO CAPS
50.0000 mg | ORAL_CAPSULE | Freq: Once | ORAL | Status: AC
  Administered 2014-09-26: 50 mg via ORAL

## 2014-09-26 MED ORDER — DIPHENHYDRAMINE HCL 25 MG PO CAPS
ORAL_CAPSULE | ORAL | Status: AC
  Filled 2014-09-26: qty 2

## 2014-09-26 MED ORDER — SODIUM CHLORIDE 0.9 % IV SOLN
Freq: Once | INTRAVENOUS | Status: AC
  Administered 2014-09-26: 11:00:00 via INTRAVENOUS
  Filled 2014-09-26: qty 4

## 2014-09-26 MED ORDER — HEPARIN SOD (PORK) LOCK FLUSH 100 UNIT/ML IV SOLN
500.0000 [IU] | Freq: Once | INTRAVENOUS | Status: AC | PRN
Start: 2014-09-26 — End: 2014-09-26
  Administered 2014-09-26: 500 [IU]
  Filled 2014-09-26: qty 5

## 2014-09-26 NOTE — Patient Instructions (Signed)
[facility] Discharge Instructions for Patients Receiving Chemotherapy  Today you received the following chemotherapy agents; Abraxene and Herceptin.   To help prevent nausea and vomiting after your treatment, we encourage you to take your nausea medication as directed.    If you develop nausea and vomiting that is not controlled by your nausea medication, call the clinic.   BELOW ARE SYMPTOMS THAT SHOULD BE REPORTED IMMEDIATELY:  *FEVER GREATER THAN 100.5 F  *CHILLS WITH OR WITHOUT FEVER  NAUSEA AND VOMITING THAT IS NOT CONTROLLED WITH YOUR NAUSEA MEDICATION  *UNUSUAL SHORTNESS OF BREATH  *UNUSUAL BRUISING OR BLEEDING  TENDERNESS IN MOUTH AND THROAT WITH OR WITHOUT PRESENCE OF ULCERS  *URINARY PROBLEMS  *BOWEL PROBLEMS  UNUSUAL RASH Items with * indicate a potential emergency and should be followed up as soon as possible.  Feel free to call the clinic you have any questions or concerns. The clinic phone number is [phone].  Please show the [facility] at check-in to the Emergency Department and triage nurse.

## 2014-09-26 NOTE — Progress Notes (Signed)
Patient Care Team: Albina Billet, MD as PCP - General (Internal Medicine) Albina Billet, MD (Internal Medicine)  DIAGNOSIS: No matching staging information was found for the patient.  SUMMARY OF ONCOLOGIC HISTORY:   Breast cancer of lower-inner quadrant of right female breast   05/16/2014 Initial Diagnosis Ultrasound-guided biopsy: Invasive ductal carcinoma with DCIS, grade 1-2, ER/PR positive, Ki-67 43%, HER-2 positive   05/28/2014 Breast MRI Right breast: 1.4 x 1.2 x 1 cm oval enhancing mass lower inner quadrant   06/18/2014 Surgery Right breast lumpectomy: Invasive ductal carcinoma 1.3 cm negative for LVI; DCIS 1 mm from margins, 1 sentinel node negative, grade 3 ER 95%, PR 77%, HER-2 amplified ratio 8.25, Ki-67 43% T1 cN0 M0 stage IA   07/18/2014 -  Chemotherapy Abraxane Herceptin weekly 12 followed by Herceptin maintenance every 3 weeks    CHIEF COMPLIANT: Abraxane Herceptin weekly 11/12  INTERVAL HISTORY: Krystal Kelley is a 44 year old with above-mentioned history of right breast cancer currently on adjuvant chemotherapy with Abraxane and Herceptin weekly. She tolerates these drugs well with few complaints. He appetite is decreased secondary to taste changes. She numbness to her left thumb is constant but goes and goes in the adjacent 3 fingers. She uses imodium PRN loose stools, but these are unpredictable. She has grade 1 edema to her bilateral ankles but has not been taking her HCTZ as prescribed.   REVIEW OF SYSTEMS:   Constitutional: Denies fevers, chills or abnormal weight loss Eyes: Denies blurriness of vision Ears, nose, mouth, throat, and face: Denies mucositis or sore throat Respiratory: Denies cough, dyspnea or wheezes Cardiovascular: Denies palpitation, chest discomfort or lower extremity swelling Gastrointestinal:  Denies nausea, heartburn or change in bowel habits Skin: Denies abnormal skin rashes Lymphatics: Denies new lymphadenopathy or easy bruising Neurological:  Tingling and numbness in the index finger and the thumb of left hand Behavioral/Psych: Mood is stable, no new changes   All other systems were reviewed with the patient and are negative.  I have reviewed the past medical history, past surgical history, social history and family history with the patient and they are unchanged from previous note.  ALLERGIES:  has No Known Allergies.  MEDICATIONS:  Current Outpatient Prescriptions  Medication Sig Dispense Refill  . hydrochlorothiazide (HYDRODIURIL) 25 MG tablet Take 25 mg by mouth daily.    [name] lidocaine-prilocaine (EMLA) cream Apply 1 application topically as needed. 30 g 0  . loperamide (IMODIUM) 2 MG capsule Take 2 mg by mouth as needed for diarrhea or loose stools.    . ondansetron (ZOFRAN) 8 MG tablet Take 1 tablet (8 mg total) by mouth 2 (two) times daily. Start the day after chemo for 2 days. Then take as needed for nausea or vomiting. 30 tablet 1  . prochlorperazine (COMPAZINE) 10 MG tablet Take 1 tablet (10 mg total) by mouth every 6 (six) hours as needed (Nausea or vomiting). 30 tablet 1  . ALPRAZolam (XANAX) 0.5 MG tablet Take 0.5 mg by mouth 3 (three) times daily as needed for anxiety.   0  . chlorpheniramine-HYDROcodone (TUSSIONEX) 10-8 MG/5ML LQCR Take 5 mLs by mouth every 12 (twelve) hours as needed for cough.   0  . Ketoprofen CR (KETOPROFEN CR) 200 MG CP24 capsule SR 24 hr Take 200 mg by mouth daily.   0  . oxyCODONE-acetaminophen (ROXICET) 5-325 MG per tablet Take 1-2 tablets by mouth every 4 (four) hours as needed for severe pain. (Patient not taking: Reported on 09/26/2014) 40 tablet 0  No current facility-administered medications for this visit.   Facility-Administered Medications Ordered in Other Visits  Medication Dose Route Frequency Provider Last Rate Last Dose  . sodium chloride 0.9 % injection 10 mL  10 mL Intracatheter PRN Nicholas Lose, MD   10 mL at 09/26/14 1319    PHYSICAL EXAMINATION: ECOG PERFORMANCE STATUS: 1  - Symptomatic but completely ambulatory  Filed Vitals:   09/26/14 1014  BP: 145/87  Pulse: 91  Temp: 98.3 F (36.8 C)  Resp: 20   Filed Weights   09/26/14 1014  Weight: 243 lb (110.224 kg)    GENERAL:alert, no distress and comfortable SKIN: skin color, texture, turgor are normal, no rashes or significant lesions EYES: normal, Conjunctiva are pink and non-injected, sclera clear OROPHARYNX:no exudate, no erythema and lips, buccal mucosa, and tongue normal  NECK: supple, thyroid normal size, non-tender, without nodularity LYMPH:  no palpable lymphadenopathy in the cervical, axillary or inguinal LUNGS: clear to auscultation and percussion with normal breathing effort HEART: regular rate & rhythm and no murmurs, +1 edema to bilateral ankles ABDOMEN:abdomen soft, non-tender and normal bowel sounds Musculoskeletal:no cyanosis of digits and no clubbing  NEURO: Neuropathy grade 1   LABORATORY DATA:  I have reviewed the data as listed   Chemistry      Component Value Date/Time   NA 141 09/26/2014 1009   NA 140 08/01/2014 0918   NA 135* 06/11/2013 0408   K 3.7 09/26/2014 1009   K 4.0 08/01/2014 0918   K 3.2* 06/11/2013 0408   CL 107 08/01/2014 0918   CL 102 06/11/2013 0408   CO2 28 09/26/2014 1009   CO2 28 08/01/2014 0918   CO2 26 06/11/2013 0408   BUN 10.9 09/26/2014 1009   BUN 12 08/01/2014 0918   BUN 15 06/11/2013 0408   CREATININE 0.9 09/26/2014 1009   CREATININE 0.93 08/01/2014 0918   CREATININE 1.16 06/11/2013 0408      Component Value Date/Time   CALCIUM 9.1 09/26/2014 1009   CALCIUM 8.8 08/01/2014 0918   CALCIUM 8.7 06/11/2013 0408   ALKPHOS 60 09/26/2014 1009   ALKPHOS 54 08/01/2014 0918   ALKPHOS 75 06/12/2013 1541   AST 24 09/26/2014 1009   AST 41* 08/01/2014 0918   AST 137* 06/12/2013 1541   ALT 22 09/26/2014 1009   ALT 54* 08/01/2014 0918   ALT 192* 06/12/2013 1541   BILITOT 0.34 09/26/2014 1009   BILITOT 0.2* 08/01/2014 0918       Lab Results   Component Value Date   WBC 8.2 09/26/2014   HGB 10.9* 09/26/2014   HCT 31.9* 09/26/2014   MCV 95.2 09/26/2014   PLT 450* 09/26/2014   NEUTROABS 4.8 09/26/2014   ASSESSMENT & PLAN:  Breast cancer of lower-inner quadrant of right female breast Right breast lumpectomy 06/18/2014: Invasive ductal carcinoma 1.3 cm negative for LVI; DCIS 1 mm from margins, 1 sentinel node negative, grade 3 ER 95%, PR 77%, HER-2 amplified ratio 8.25, Ki-67 43% T1 cN0 M0 stage IA  Treatment plan: Abraxane and Herceptin weekly 12 started 07/18/2014 followed by Herceptin every 3 weeks for 1 year, after 12 weeks of chemotherapy and radiation therapy, after radiation is complete antiestrogen therapy with tamoxifen 20 mg 10 years  Current treatment: Week 11/12 Abraxane Herceptin Chemotherapy toxicities:  1. Neuropathy grade 1: Will be watching and monitoring it 2. Anemia grade 1 3. Alopecia 4. Bilateral ankle edema: elevation, avoid sodium, compression stockings, HCTZ  Patient denies any nausea vomiting diarrhea constipation or any  side effects to chemotherapy.  Chemotherapy monitoring: Echocardiogram was done 06/13/2014 showing EF 55-60% She is due for a repeat echocardiogram, so I have placed orders for that to be performed as soon as possible.   She will complete abraxane next week, and will continue with the herceptin every 3 weeks to complete 1 year of treatment.   She will return on 7/1 for a follow up visit with Dr. Lindi Adie  No orders of the defined types were placed in this encounter.   The patient has a good understanding of the overall plan. she agrees with it. she will call with any problems that may develop before the next visit here.   Laurie Panda, NP

## 2014-09-27 ENCOUNTER — Telehealth: Payer: Self-pay | Admitting: Hematology and Oncology

## 2014-09-27 NOTE — Telephone Encounter (Signed)
Patient aware of her echo appointment °

## 2014-10-03 ENCOUNTER — Other Ambulatory Visit (HOSPITAL_BASED_OUTPATIENT_CLINIC_OR_DEPARTMENT_OTHER): Payer: 59

## 2014-10-03 ENCOUNTER — Telehealth: Payer: Self-pay | Admitting: Nurse Practitioner

## 2014-10-03 ENCOUNTER — Encounter: Payer: Self-pay | Admitting: Nurse Practitioner

## 2014-10-03 ENCOUNTER — Ambulatory Visit (HOSPITAL_BASED_OUTPATIENT_CLINIC_OR_DEPARTMENT_OTHER): Payer: 59 | Admitting: Nurse Practitioner

## 2014-10-03 ENCOUNTER — Ambulatory Visit (HOSPITAL_BASED_OUTPATIENT_CLINIC_OR_DEPARTMENT_OTHER): Payer: 59

## 2014-10-03 ENCOUNTER — Ambulatory Visit (HOSPITAL_COMMUNITY)
Admission: RE | Admit: 2014-10-03 | Discharge: 2014-10-03 | Disposition: A | Discharge status: Home / Self Care | Payer: 59 | Source: Ambulatory Visit | Attending: Nurse Practitioner | Admitting: Nurse Practitioner

## 2014-10-03 VITALS — BP 130/74 | HR 96 | Temp 98.3°F | Resp 18

## 2014-10-03 DIAGNOSIS — M79604 Pain in right leg: Secondary | ICD-10-CM | POA: Diagnosis present

## 2014-10-03 DIAGNOSIS — Z5112 Encounter for antineoplastic immunotherapy: Secondary | ICD-10-CM | POA: Diagnosis not present

## 2014-10-03 DIAGNOSIS — C50311 Malignant neoplasm of lower-inner quadrant of right female breast: Secondary | ICD-10-CM

## 2014-10-03 DIAGNOSIS — R609 Edema, unspecified: Secondary | ICD-10-CM | POA: Diagnosis not present

## 2014-10-03 DIAGNOSIS — I82811 Embolism and thrombosis of superficial veins of right lower extremities: Secondary | ICD-10-CM | POA: Insufficient documentation

## 2014-10-03 DIAGNOSIS — Z5111 Encounter for antineoplastic chemotherapy: Secondary | ICD-10-CM | POA: Diagnosis not present

## 2014-10-03 DIAGNOSIS — R600 Localized edema: Secondary | ICD-10-CM

## 2014-10-03 LAB — COMPREHENSIVE METABOLIC PANEL (CC13)
ALT: 27 U/L (ref 0–55)
AST: 25 U/L (ref 5–34)
Albumin: 3.3 g/dL — ABNORMAL LOW (ref 3.5–5.0)
Alkaline Phosphatase: 63 U/L (ref 40–150)
Anion Gap: 7 mEq/L (ref 3–11)
BUN: 15.5 mg/dL (ref 7.0–26.0)
CO2: 28 mEq/L (ref 22–29)
Calcium: 8.9 mg/dL (ref 8.4–10.4)
Chloride: 106 mEq/L (ref 98–109)
Creatinine: 1 mg/dL (ref 0.6–1.1)
EGFR: 78 ml/min/1.73 m2 — ABNORMAL LOW (ref >90)
Glucose: 102 mg/dl (ref 70–140)
Potassium: 4.1 mEq/L (ref 3.5–5.1)
Sodium: 141 mEq/L (ref 136–145)
Total Bilirubin: 0.37 mg/dL (ref 0.20–1.20)
Total Protein: 6.8 g/dL (ref 6.4–8.3)

## 2014-10-03 LAB — CBC WITH DIFFERENTIAL/PLATELET
BASO%: 0.4 % (ref 0.0–2.0)
Basophils Absolute: 0 10e3/uL (ref 0.0–0.1)
EOS%: 1 % (ref 0.0–7.0)
Eosinophils Absolute: 0.1 10*3/uL (ref 0.0–0.5)
HCT: 31.5 % — ABNORMAL LOW (ref 34.8–46.6)
HGB: 10.5 g/dL — ABNORMAL LOW (ref 11.6–15.9)
LYMPH%: 30.6 % (ref 14.0–49.7)
MCH: 32 pg (ref 25.1–34.0)
MCHC: 33.3 g/dL (ref 31.5–36.0)
MCV: 96 fL (ref 79.5–101.0)
MONO#: 0.8 10*3/uL (ref 0.1–0.9)
MONO%: 8.2 % (ref 0.0–14.0)
NEUT#: 5.5 10*3/uL (ref 1.5–6.5)
NEUT%: 59.8 % (ref 38.4–76.8)
Platelets: 445 10e3/uL — ABNORMAL HIGH (ref 145–400)
RBC: 3.28 10*6/uL — ABNORMAL LOW (ref 3.70–5.45)
RDW: 16.4 % — ABNORMAL HIGH (ref 11.2–14.5)
WBC: 9.2 10*3/uL (ref 3.9–10.3)
lymph#: 2.8 10*3/uL (ref 0.9–3.3)
nRBC: 2 % — ABNORMAL HIGH (ref 0–0)

## 2014-10-03 MED ORDER — SODIUM CHLORIDE 0.9 % IV SOLN: Freq: Once | INTRAVENOUS | Status: DC

## 2014-10-03 MED ORDER — ACETAMINOPHEN 325 MG PO TABS
ORAL_TABLET | ORAL | Status: AC
  Filled 2014-10-03: qty 2

## 2014-10-03 MED ORDER — HEPARIN SOD (PORK) LOCK FLUSH 100 UNIT/ML IV SOLN
500.0000 [IU] | Freq: Once | INTRAVENOUS | Status: AC | PRN
  Administered 2014-10-03: 500 [IU]
  Filled 2014-10-03: qty 5

## 2014-10-03 MED ORDER — DIPHENHYDRAMINE HCL 25 MG PO CAPS
50.0000 mg | ORAL_CAPSULE | Freq: Once | ORAL | Status: AC
  Administered 2014-10-03: 50 mg via ORAL

## 2014-10-03 MED ORDER — SODIUM CHLORIDE 0.9 % IV SOLN
Freq: Once | INTRAVENOUS | Status: AC
  Administered 2014-10-03: 10:00:00 via INTRAVENOUS
  Filled 2014-10-03: qty 4

## 2014-10-03 MED ORDER — TRASTUZUMAB CHEMO INJECTION 440 MG
2.0000 mg/kg | Freq: Once | INTRAVENOUS | Status: AC
  Administered 2014-10-03: 210 mg via INTRAVENOUS
  Filled 2014-10-03: qty 10

## 2014-10-03 MED ORDER — ACETAMINOPHEN 325 MG PO TABS
650.0000 mg | ORAL_TABLET | Freq: Once | ORAL | Status: AC
  Administered 2014-10-03: 650 mg via ORAL

## 2014-10-03 MED ORDER — DIPHENHYDRAMINE HCL 25 MG PO CAPS
ORAL_CAPSULE | ORAL | Status: AC
  Filled 2014-10-03: qty 2

## 2014-10-03 MED ORDER — SODIUM CHLORIDE 0.9 % IV SOLN
Freq: Once | INTRAVENOUS | Status: AC
  Administered 2014-10-03: 10:00:00 via INTRAVENOUS

## 2014-10-03 MED ORDER — SODIUM CHLORIDE 0.9 % IJ SOLN
10.0000 mL | INTRAMUSCULAR | Status: DC | PRN
  Administered 2014-10-03: 10 mL
  Filled 2014-10-03: qty 10

## 2014-10-03 MED ORDER — PACLITAXEL PROTEIN-BOUND CHEMO INJECTION 100 MG
100.0000 mg/m2 | Freq: Once | INTRAVENOUS | Status: AC
  Administered 2014-10-03: 225 mg via INTRAVENOUS
  Filled 2014-10-03: qty 45

## 2014-10-03 NOTE — Telephone Encounter (Signed)
error 

## 2014-10-03 NOTE — Patient Instructions (Signed)
[facility] Discharge Instructions for Patients Receiving Chemotherapy  Today you received the following chemotherapy agents: Abraxane, Herceptin To help prevent nausea and vomiting after your treatment, we encourage you to take your nausea medication: Compazine 10 mg every 6 hours as needed, Zofran 8 mg every 12 hours as needed.  If you develop nausea and vomiting that is not controlled by your nausea medication, call the clinic.   BELOW ARE SYMPTOMS THAT SHOULD BE REPORTED IMMEDIATELY:  *FEVER GREATER THAN 100.5 F  *CHILLS WITH OR WITHOUT FEVER  NAUSEA AND VOMITING THAT IS NOT CONTROLLED WITH YOUR NAUSEA MEDICATION  *UNUSUAL SHORTNESS OF BREATH  *UNUSUAL BRUISING OR BLEEDING  TENDERNESS IN MOUTH AND THROAT WITH OR WITHOUT PRESENCE OF ULCERS  *URINARY PROBLEMS  *BOWEL PROBLEMS  UNUSUAL RASH Items with * indicate a potential emergency and should be followed up as soon as possible.  Feel free to call the clinic you have any questions or concerns. The clinic phone number is [phone].  Please show the [facility] at check-in to the Emergency Department and triage nurse.

## 2014-10-03 NOTE — Telephone Encounter (Signed)
Addendum:   *Preliminary Results* Right lower extremity venous duplex completed. Right lower extremity is negative for deep vein thrombosis. There is a small segment of superficial vein thrombosis involving the greater saphenous vein in the mid calf. There is no evidence of right Baker's cyst.

## 2014-10-03 NOTE — Assessment & Plan Note (Signed)
Patient presented to the [facility] today to receive her weekly Abraxane/Herceptin chemotherapy.  Blood counts remained fairly stable; and patient appears to be tolerating well.  Patient is scheduled to return on 10/18/2014 for labs and a follow-up visit.

## 2014-10-03 NOTE — Assessment & Plan Note (Signed)
Patient with complaint of right lower extremity edema; with one area of mild erythema and tenderness.  Patient denies any chest pain, chest pressure, shortness breath, or pain with inspiration.  She denies any fevers or chills.  On exam.-Patient has increased edema to the right versus the left leg.  She also has an approximately 1 cm in diameter area to her right medial calf with noted firm mass.  There is no obvious erythema, warmth, or tenderness with palpation.  Patient will obtain a Doppler ultrasound to rule out superficial thrombus or DVT later this afternoon.  Will follow up accordingly.

## 2014-10-03 NOTE — Telephone Encounter (Signed)
Doppler ultrasound of the right lower extremity obtained later this afternoon was negative for DVT; that was positive for superficial thrombus.  Called patient to review results.  Advised patient to elevate her legs above the level of heart whenever possible; and to remain as active as she can.  Also advised patient to call/return directly to the emergency department for any worsening symptoms whatsoever.  Patient stated understanding of all instructions; and was in agreement with this plan of care.

## 2014-10-03 NOTE — Progress Notes (Signed)
SYMPTOM MANAGEMENT CLINIC   HPI: Krystal Kelley 44 y.o. female diagnosed with breast cancer.  Currently undergoing Abraxane/Herceptin chemotherapy regimen.  Patient with complaint of right lower extremity edema; with one area of mild erythema and tenderness.  Patient denies any chest pain, chest pressure, shortness breath, or pain with inspiration.  She denies any fevers or chills.  HPI  ROS  Past Medical History  Diagnosis Date  . Hypertension   . Breast cancer, right breast     Past Surgical History  Procedure Laterality Date  . Bunion surgrery    . Carpel tunnel    . Breast biopsy  05/16/14  . Cholecystectomy  2015  . Portacath placement Left 06/18/2014    Procedure: INSERTION PORT-A-CATH;  Surgeon: Excell Seltzer, MD;  Location: [facility];  Service: General;  Laterality: Left;    has Breast cancer of lower-inner quadrant of right female breast; Genetic testing; and Leg edema, right on her problem list.    has No Known Allergies.    Medication List       This list is accurate as of: 10/03/14  3:07 PM.  Always use your most recent med list.               ALPRAZolam 0.5 MG tablet  Commonly known as:  XANAX  Take 0.5 mg by mouth 3 (three) times daily as needed for anxiety.     chlorpheniramine-HYDROcodone 10-8 MG/5ML Lqcr  Commonly known as:  TUSSIONEX  Take 5 mLs by mouth every 12 (twelve) hours as needed for cough.     hydrochlorothiazide 25 MG tablet  Commonly known as:  HYDRODIURIL  Take 25 mg by mouth daily.     Ketoprofen CR 200 MG Cp24 capsule SR 24 hr  Commonly known as:  KETOPROFEN CR  Take 200 mg by mouth daily.     lidocaine-prilocaine cream  Commonly known as:  EMLA  Apply 1 application topically as needed.     loperamide 2 MG capsule  Commonly known as:  IMODIUM  Take 2 mg by mouth as needed for diarrhea or loose stools.     ondansetron 8 MG tablet  Commonly known as:  ZOFRAN  Take 1 tablet (8 mg total) by mouth 2  (two) times daily. Start the day after chemo for 2 days. Then take as needed for nausea or vomiting.     oxyCODONE-acetaminophen 5-325 MG per tablet  Commonly known as:  ROXICET  Take 1-2 tablets by mouth every 4 (four) hours as needed for severe pain.     prochlorperazine 10 MG tablet  Commonly known as:  COMPAZINE  Take 1 tablet (10 mg total) by mouth every 6 (six) hours as needed (Nausea or vomiting).         PHYSICAL EXAMINATION  Oncology Vitals 10/03/2014 09/26/2014 09/19/2014 09/12/2014 09/05/2014 09/04/2014 08/29/2014  Height - - - 165 cm - - 165 cm  Weight - 110.224 kg - 109.181 kg - - 109.997 kg  Weight (lbs) - 243 lbs - 240 lbs 11 oz - - 242 lbs 8 oz  BMI (kg/m2) - - - 40.05 kg/m2 - - 40.35 kg/m2  Temp 98.3 98.3 98.4 97 98.6 - 98.1  Pulse 96 91 86 88 96 88 97  Resp _0 SpO2 97 100 100 99 100 - 100  BSA (m2) - - - 2.24 m2 - - 2.25 m2   BP Readings from Last 3 Encounters:  10/03/14  130/74  09/26/14 145/87  09/19/14 134/75    Physical Exam  Constitutional: She is oriented to person, place, and time and well-developed, well-nourished, and in no distress.  HENT:  Head: Normocephalic and atraumatic.  Eyes: Conjunctivae and EOM are normal. Pupils are equal, round, and reactive to light. Right eye exhibits no discharge. Left eye exhibits no discharge. No scleral icterus.  Neck: Normal range of motion.  Pulmonary/Chest: Effort normal. No respiratory distress.  Musculoskeletal: Normal range of motion. She exhibits edema. She exhibits no tenderness.  On exam.-Patient has increased edema to the right versus the left leg.  She also has an approximately 1 cm in diameter area to her right medial calf with noted firm mass.  There is no obvious erythema, warmth, or tenderness with palpation.    Neurological: She is alert and oriented to person, place, and time. Gait normal.  Skin: Skin is warm and dry. No rash noted. No erythema. No pallor.  Psychiatric: Affect normal.    Nursing note and vitals reviewed.   LABORATORY DATA:. Appointment on 10/03/2014  Component Date Value Ref Range Status  . WBC 10/03/2014 9.2  3.9 - 10.3 10e3/uL Final  . NEUT# 10/03/2014 5.5  1.5 - 6.5 10e3/uL Final  . HGB 10/03/2014 10.5* 11.6 - 15.9 g/dL Final  . HCT 10/03/2014 31.5* 34.8 - 46.6 % Final  . Platelets 10/03/2014 445* 145 - 400 10e3/uL Final  . MCV 10/03/2014 96.0  79.5 - 101.0 fL Final  . MCH 10/03/2014 32.0  25.1 - 34.0 pg Final  . MCHC 10/03/2014 33.3  31.5 - 36.0 g/dL Final  . RBC 10/03/2014 3.28* 3.70 - 5.45 10e6/uL Final  . RDW 10/03/2014 16.4* 11.2 - 14.5 % Final  . lymph# 10/03/2014 2.8  0.9 - 3.3 10e3/uL Final  . MONO# 10/03/2014 0.8  0.1 - 0.9 10e3/uL Final  . Eosinophils Absolute 10/03/2014 0.1  0.0 - 0.5 10e3/uL Final  . Basophils Absolute 10/03/2014 0.0  0.0 - 0.1 10e3/uL Final  . NEUT% 10/03/2014 59.8  38.4 - 76.8 % Final  . LYMPH% 10/03/2014 30.6  14.0 - 49.7 % Final  . MONO% 10/03/2014 8.2  0.0 - 14.0 % Final  . EOS% 10/03/2014 1.0  0.0 - 7.0 % Final  . BASO% 10/03/2014 0.4  0.0 - 2.0 % Final  . nRBC 10/03/2014 2* 0 - 0 % Final  . Sodium 10/03/2014 141  136 - 145 mEq/L Final  . Potassium 10/03/2014 4.1  3.5 - 5.1 mEq/L Final  . Chloride 10/03/2014 106  98 - 109 mEq/L Final  . CO2 10/03/2014 28  22 - 29 mEq/L Final  . Glucose 10/03/2014 102  70 - 140 mg/dl Final  . BUN 10/03/2014 15.5  7.0 - 26.0 mg/dL Final  . Creatinine 10/03/2014 1.0  0.6 - 1.1 mg/dL Final  . Total Bilirubin 10/03/2014 0.37  0.20 - 1.20 mg/dL Final  . Alkaline Phosphatase 10/03/2014 63  40 - 150 U/L Final  . AST 10/03/2014 25  5 - 34 U/L Final  . ALT 10/03/2014 27  0 - 55 U/L Final  . Total Protein 10/03/2014 6.8  6.4 - 8.3 g/dL Final  . Albumin 10/03/2014 3.3* 3.5 - 5.0 g/dL Final  . Calcium 10/03/2014 8.9  8.4 - 10.4 mg/dL Final  . Anion Gap 10/03/2014 7  3 - 11 mEq/L Final  . EGFR 10/03/2014 78* >90 ml/min/1.73 m2 Final   eGFR is calculated using the CKD-EPI Creatinine  Equation (2009)     RADIOGRAPHIC  STUDIES: No results found.  ASSESSMENT/PLAN:    Breast cancer of lower-inner quadrant of right female breast Patient presented to the [facility] today to receive her weekly Abraxane/Herceptin chemotherapy.  Blood counts remained fairly stable; and patient appears to be tolerating well.  Patient is scheduled to return on 10/18/2014 for labs and a follow-up visit.  Leg edema, right Patient with complaint of right lower extremity edema; with one area of mild erythema and tenderness.  Patient denies any chest pain, chest pressure, shortness breath, or pain with inspiration.  She denies any fevers or chills.  On exam.-Patient has increased edema to the right versus the left leg.  She also has an approximately 1 cm in diameter area to her right medial calf with noted firm mass.  There is no obvious erythema, warmth, or tenderness with palpation.  Patient will obtain a Doppler ultrasound to rule out superficial thrombus or DVT later this afternoon.  Will follow up accordingly.   Patient stated understanding of all instructions; and was in agreement with this plan of care. The patient knows to call the clinic with any problems, questions or concerns.   Review/collaboration with Dr. Lindi Adie regarding all aspects of patient's visit today.   Total time spent with patient was 25 minutes;  with greater than 75 percent of that time spent in face to face counseling regarding patient's symptoms,  and coordination of care and follow up.  Disclaimer: This note was dictated with voice recognition software. Similar sounding words can inadvertently be transcribed and may not be corrected upon review.   Drue Second, NP 10/03/2014

## 2014-10-03 NOTE — Progress Notes (Signed)
*  Preliminary Results* Right lower extremity venous duplex completed. Right lower extremity is negative for deep vein thrombosis. There is a small segment of superficial vein thrombosis involving the greater saphenous vein in the mid calf. There is no evidence of right Baker's cyst.  Preliminary results communicated to Retta Mac, NP.  10/03/2014 3:25 PM  Maudry Mayhew, RVT, RDCS, RDMS

## 2014-10-04 ENCOUNTER — Encounter (HOSPITAL_COMMUNITY): Payer: 59

## 2014-10-04 NOTE — Progress Notes (Addendum)
Location of Breast Cancer: right lower inner breast cancer   Histology per Pathology Report:   05/16/14   Receptor Status: ER(95% positive), PR (77% positive), Her2-neu (positive)  Did patient present with symptoms (if so, please note symptoms) or was this found on screening mammography?: patient was able to feel a lump near the skin in the medial aspect of her right breast.  Past/Anticipated interventions by surgeon, if any: possible radioactive seed localized right breast lumpectomy and right axillary sentinel lymph node biopsy with port a cath placement pending results of MRI and medical oncology evaluation.  Past/Anticipated interventions by medical oncology: Abraxane and Herceptin weekly 12 started 07/18/2014 followed by Herceptin every 3 weeks for 1 year, after 12 weeks of chemotherapy and radiation therapy, after radiation is complete antiestrogen therapy with tamoxifen 20 mg 10 years  Lymphedema issues, if any: no  Pain issues, if any: no  OB GYN history: age of menarche: 8 years, has used BCP for 30 years, Has not had any children, she does have a niece that she takes care of.  SAFETY ISSUES:  Prior radiation? no  Pacemaker/ICD? no  Possible current pregnancy? no  Is the patient on methotrexate? no  Current Complaints / other details:Swelling to bilateral legs, had a superficial blood clots right calf.  Dx: last week.     Jacqulyn Liner, RN 05/28/2014,8:19 AM

## 2014-10-09 ENCOUNTER — Ambulatory Visit
Admission: RE | Admit: 2014-10-09 | Discharge: 2014-10-09 | Disposition: A | Discharge status: Home / Self Care | Payer: 59 | Source: Ambulatory Visit | Attending: Radiation Oncology | Admitting: Radiation Oncology

## 2014-10-09 ENCOUNTER — Ambulatory Visit (HOSPITAL_COMMUNITY)
Admission: RE | Admit: 2014-10-09 | Discharge: 2014-10-09 | Disposition: A | Discharge status: Home / Self Care | Payer: 59 | Source: Ambulatory Visit | Attending: Hematology and Oncology | Admitting: Hematology and Oncology

## 2014-10-09 ENCOUNTER — Encounter: Payer: Self-pay | Admitting: Radiation Oncology

## 2014-10-09 VITALS — BP 142/86 | HR 86 | Temp 98.3°F | Resp 12 | Wt 243.4 lb

## 2014-10-09 DIAGNOSIS — C50311 Malignant neoplasm of lower-inner quadrant of right female breast: Secondary | ICD-10-CM

## 2014-10-09 DIAGNOSIS — Z01818 Encounter for other preprocedural examination: Secondary | ICD-10-CM | POA: Diagnosis present

## 2014-10-09 DIAGNOSIS — I10 Essential (primary) hypertension: Secondary | ICD-10-CM | POA: Insufficient documentation

## 2014-10-09 DIAGNOSIS — Z5111 Encounter for antineoplastic chemotherapy: Secondary | ICD-10-CM | POA: Diagnosis not present

## 2014-10-09 NOTE — Progress Notes (Signed)
  Echocardiogram 2D Echocardiogram has been performed.  Krystal Kelley 10/09/2014, 12:24 PM

## 2014-10-09 NOTE — Progress Notes (Signed)
Radiation Oncology         [phone]) [phone] ________________________________  Name: Krystal Kelley MRN: [id number]  Date: 10/09/2014  DOB: 02-25-1971  Reevaluation Note  CC: Krystal Billet, MD  Excell Seltzer, MD    ICD-9-CM ICD-10-CM   1. Breast cancer of lower-inner quadrant of right female breast 174.3 C50.311     Diagnosis:   Invasive ductal carcinoma and DCIS of the right breast  Narrative:  The patient returns today to discuss radiation treatment in the management of her disease.  Denies any pain. She notes tingling of her fingers due to chemotherapy. Denies any discharge in the area of the breast.  Recently completed her adjuvant chemotherapy. But will continue on Herceptin.  Initially  seen back in February for initial consultation.  surgery was performed on 06/18/2014                         Breast cancer of lower-inner quadrant of right female breast   05/16/2014 Initial Diagnosis Ultrasound-guided biopsy: Invasive ductal carcinoma with DCIS, grade 1-2, ER/PR positive, Ki-67 43%, HER-2 positive   05/28/2014 Breast MRI Right breast: 1.4 x 1.2 x 1 cm oval enhancing mass lower inner quadrant   06/18/2014 Surgery Right breast lumpectomy: Invasive ductal carcinoma 1.3 cm negative for LVI; DCIS 1 mm from margins, 1 sentinel node negative, grade 3 ER 95%, PR 77%, HER-2 amplified ratio 8.25, Ki-67 43% T1 cN0 M0 stage IA   07/18/2014 -  Chemotherapy Abraxane Herceptin weekly 12 followed by Herceptin maintenance every 3 weeks        ALLERGIES:  has No Known Allergies.  Meds: Current Outpatient Prescriptions  Medication Sig Dispense Refill  . chlorpheniramine-HYDROcodone (TUSSIONEX) 10-8 MG/5ML LQCR Take 5 mLs by mouth every 12 (twelve) hours as needed for cough.   0  . hydrochlorothiazide (HYDRODIURIL) 25 MG tablet Take 25 mg by mouth daily.    [name] lidocaine-prilocaine (EMLA) cream Apply 1 application topically as needed. 30 g 0  . loperamide (IMODIUM) 2 MG capsule Take  2 mg by mouth as needed for diarrhea or loose stools.    . ondansetron (ZOFRAN) 8 MG tablet Take 1 tablet (8 mg total) by mouth 2 (two) times daily. Start the day after chemo for 2 days. Then take as needed for nausea or vomiting. 30 tablet 1  . prochlorperazine (COMPAZINE) 10 MG tablet Take 1 tablet (10 mg total) by mouth every 6 (six) hours as needed (Nausea or vomiting). 30 tablet 1  . RESTASIS 0.05 % ophthalmic emulsion INSTILL ONE DROP IN AFFECTED EYE 2 TIMES A DAY  4   No current facility-administered medications for this encounter.    Physical Findings: The patient is in no acute distress. Patient is alert and oriented.  weight is 243 lb 6.4 oz (110.406 kg). Her oral temperature is 98.3 F (36.8 C). Her blood pressure is 142/86 and her pulse is 86. Her respiration is 12 and oxygen saturation is 100%. .    Breast: Left breast shows no palp mass or nipple discharge.Right breast faint scars in lower inner quadrant, no palpable mass or discharge.  No palpable cervical, supraclavicular or axillary lymphoadenopathy General: Well-developed, in no acute distress HEENT: Normocephalic, atraumatic Cardiovascular: Regular rate and rhythm Respiratory: Clear to auscultation bilaterally GI: Soft, nontender, normal bowel sounds Extremities: No edema present   Lab Findings: Lab Results  Component Value Date   WBC 9.2 10/03/2014   HGB 10.5* 10/03/2014   HCT 31.5* 10/03/2014  MCV 96.0 10/03/2014   PLT 445* 10/03/2014    Radiographic Findings: No results found.  Impression:   Patient is recovering from the effects of chemotherapy. She would be a good candidate for breast conservation with radiation therapy directed at the right breast  Plan: We discussed the possible side effects and risks of treatment in addition to the possible benefits of treatment. We discussed the protocol for radiation treatment.  All of the patient's questions were answered. The patient does wish to proceed with  this treatment. A simulation will be scheduled such that we can proceed with treatment planning.   Simulation scheduled for July 5th.  -----------------------------------    This document serves as a record of services personally performed by Gery Pray, MD. It was created on his behalf by Derek Mound, a trained medical scribe. The creation of this record is based on the scribe's personal observations and the provider's statements to them. This document has been checked and approved by the attending provider.   Blair Promise, PhD, MD

## 2014-10-17 NOTE — Assessment & Plan Note (Addendum)
Right breast lumpectomy 06/18/2014: Invasive ductal carcinoma 1.3 cm negative for LVI; DCIS 1 mm from margins, 1 sentinel node negative, grade 3 ER 95%, PR 77%, HER-2 amplified ratio 8.25, Ki-67 43% T1 cN0 M0 stage IA S/P Adjuvant chemo with Abraxane Herceptin completed 10/03/14 Chemo toxicities: Neuropathy grade 1, Anemia, alopecia, ankle edema  Plan: 1. Continue maintenance Herceptin q 3 weeks 2. Adjuvant XRT  RTC every 6 weeks for follow up

## 2014-10-18 ENCOUNTER — Encounter: Payer: Self-pay | Admitting: Hematology and Oncology

## 2014-10-18 ENCOUNTER — Ambulatory Visit
Admission: RE | Admit: 2014-10-18 | Discharge: 2014-10-18 | Disposition: A | Discharge status: Home / Self Care | Payer: 59 | Source: Ambulatory Visit | Attending: Hematology and Oncology | Admitting: Hematology and Oncology

## 2014-10-18 ENCOUNTER — Other Ambulatory Visit (HOSPITAL_BASED_OUTPATIENT_CLINIC_OR_DEPARTMENT_OTHER): Payer: 59

## 2014-10-18 ENCOUNTER — Other Ambulatory Visit: Payer: Self-pay

## 2014-10-18 ENCOUNTER — Telehealth: Payer: Self-pay | Admitting: Hematology and Oncology

## 2014-10-18 ENCOUNTER — Other Ambulatory Visit: Payer: Self-pay | Admitting: Hematology and Oncology

## 2014-10-18 ENCOUNTER — Ambulatory Visit (HOSPITAL_BASED_OUTPATIENT_CLINIC_OR_DEPARTMENT_OTHER): Payer: 59 | Admitting: Hematology and Oncology

## 2014-10-18 VITALS — BP 143/83 | HR 85 | Temp 98.6°F | Resp 20 | Ht 65.0 in | Wt 247.1 lb

## 2014-10-18 DIAGNOSIS — C50311 Malignant neoplasm of lower-inner quadrant of right female breast: Secondary | ICD-10-CM

## 2014-10-18 DIAGNOSIS — C50919 Malignant neoplasm of unspecified site of unspecified female breast: Secondary | ICD-10-CM | POA: Insufficient documentation

## 2014-10-18 DIAGNOSIS — M7989 Other specified soft tissue disorders: Secondary | ICD-10-CM

## 2014-10-18 DIAGNOSIS — IMO0001 Reserved for inherently not codable concepts without codable children: Secondary | ICD-10-CM

## 2014-10-18 LAB — CBC WITH DIFFERENTIAL/PLATELET
BASO%: 0.6 % (ref 0.0–2.0)
Basophils Absolute: 0.1 10e3/uL (ref 0.0–0.1)
EOS%: 1.1 % (ref 0.0–7.0)
Eosinophils Absolute: 0.1 10e3/uL (ref 0.0–0.5)
HCT: 33.1 % — ABNORMAL LOW (ref 34.8–46.6)
HGB: 11 g/dL — ABNORMAL LOW (ref 11.6–15.9)
LYMPH%: 26.8 % (ref 14.0–49.7)
MCH: 32.2 pg (ref 25.1–34.0)
MCHC: 33.2 g/dL (ref 31.5–36.0)
MCV: 96.8 fL (ref 79.5–101.0)
MONO#: 0.9 10*3/uL (ref 0.1–0.9)
MONO%: 10 % (ref 0.0–14.0)
NEUT#: 5.5 10*3/uL (ref 1.5–6.5)
NEUT%: 61.5 % (ref 38.4–76.8)
Platelets: 370 10e3/uL (ref 145–400)
RBC: 3.42 10e6/uL — ABNORMAL LOW (ref 3.70–5.45)
RDW: 16.3 % — ABNORMAL HIGH (ref 11.2–14.5)
WBC: 9 10*3/uL (ref 3.9–10.3)
lymph#: 2.4 10e3/uL (ref 0.9–3.3)

## 2014-10-18 LAB — COMPREHENSIVE METABOLIC PANEL (CC13)
ALT: 47 U/L (ref 0–55)
AST: 50 U/L — ABNORMAL HIGH (ref 5–34)
Albumin: 3.4 g/dL — ABNORMAL LOW (ref 3.5–5.0)
Alkaline Phosphatase: 71 U/L (ref 40–150)
Anion Gap: 10 mEq/L (ref 3–11)
BUN: 12.2 mg/dL (ref 7.0–26.0)
CO2: 27 mEq/L (ref 22–29)
Calcium: 9 mg/dL (ref 8.4–10.4)
Chloride: 105 mEq/L (ref 98–109)
Creatinine: 1 mg/dL (ref 0.6–1.1)
EGFR: 82 mL/min/{1.73_m2} — ABNORMAL LOW (ref >90)
Glucose: 105 mg/dl (ref 70–140)
Potassium: 4.2 mEq/L (ref 3.5–5.1)
Sodium: 142 mEq/L (ref 136–145)
Total Bilirubin: 0.41 mg/dL (ref 0.20–1.20)
Total Protein: 6.7 g/dL (ref 6.4–8.3)

## 2014-10-18 NOTE — Telephone Encounter (Signed)
Gave avs & calendar for July °

## 2014-10-18 NOTE — Progress Notes (Signed)
Patient Care Team: Albina Billet, MD as PCP - General (Internal Medicine) Albina Billet, MD (Internal Medicine)  DIAGNOSIS: No matching staging information was found for the patient.  SUMMARY OF ONCOLOGIC HISTORY:   Breast cancer of lower-inner quadrant of right female breast   05/16/2014 Initial Diagnosis Ultrasound-guided biopsy: Invasive ductal carcinoma with DCIS, grade 1-2, ER/PR positive, Ki-67 43%, HER-2 positive   05/28/2014 Breast MRI Right breast: 1.4 x 1.2 x 1 cm oval enhancing mass lower inner quadrant   06/18/2014 Surgery Right breast lumpectomy: Invasive ductal carcinoma 1.3 cm negative for LVI; DCIS 1 mm from margins, 1 sentinel node negative, grade 3 ER 95%, PR 77%, HER-2 amplified ratio 8.25, Ki-67 43% T1 cN0 M0 stage IA   07/18/2014 - 10/03/2014 Chemotherapy Abraxane Herceptin weekly 12 followed by Herceptin maintenance every 3 weeks    CHIEF COMPLIANT: Follow-up after completion of chemotherapy  INTERVAL HISTORY: Krystal Kelley is a 44 year old with above-mentioned history of right breast cancer treated with lumpectomy followed by adjuvant chemotherapy with Abraxane and Herceptin. She is going to receive Herceptin maintenance every 3 weeks. She had seen radiation oncology who recommended adjuvant radiation therapy. She will start that soon. Her appetite has come back significantly.  REVIEW OF SYSTEMS:   Constitutional: Denies fevers, chills or abnormal weight loss Eyes: Denies blurriness of vision Ears, nose, mouth, throat, and face: Denies mucositis or sore throat Respiratory: Denies cough, dyspnea or wheezes Cardiovascular: Denies palpitation, chest discomfort or lower extremity swelling Gastrointestinal:  Denies nausea, heartburn or change in bowel habits Skin: Denies abnormal skin rashes Lymphatics: Denies new lymphadenopathy or easy bruising Neurological:Denies numbness, tingling or new weaknesses Behavioral/Psych: Mood is stable, no new changes  Breast:  denies any  pain or lumps or nodules in either breasts All other systems were reviewed with the patient and are negative.  I have reviewed the past medical history, past surgical history, social history and family history with the patient and they are unchanged from previous note.  ALLERGIES:  has No Known Allergies.  MEDICATIONS:  Current Outpatient Prescriptions  Medication Sig Dispense Refill  . chlorpheniramine-HYDROcodone (TUSSIONEX) 10-8 MG/5ML LQCR Take 5 mLs by mouth every 12 (twelve) hours as needed for cough.   0  . hydrochlorothiazide (HYDRODIURIL) 25 MG tablet Take 25 mg by mouth daily.    [name] lidocaine-prilocaine (EMLA) cream Apply 1 application topically as needed. 30 g 0  . loperamide (IMODIUM) 2 MG capsule Take 2 mg by mouth as needed for diarrhea or loose stools.    . ondansetron (ZOFRAN) 8 MG tablet Take 1 tablet (8 mg total) by mouth 2 (two) times daily. Start the day after chemo for 2 days. Then take as needed for nausea or vomiting. 30 tablet 1  . prochlorperazine (COMPAZINE) 10 MG tablet Take 1 tablet (10 mg total) by mouth every 6 (six) hours as needed (Nausea or vomiting). 30 tablet 1  . RESTASIS 0.05 % ophthalmic emulsion INSTILL ONE DROP IN AFFECTED EYE 2 TIMES A DAY  4   No current facility-administered medications for this visit.    PHYSICAL EXAMINATION: ECOG PERFORMANCE STATUS: 0 - Asymptomatic  There were no vitals filed for this visit. There were no vitals filed for this visit.  GENERAL:alert, no distress and comfortable SKIN: skin color, texture, turgor are normal, no rashes or significant lesions EYES: normal, Conjunctiva are pink and non-injected, sclera clear OROPHARYNX:no exudate, no erythema and lips, buccal mucosa, and tongue normal  NECK: supple, thyroid normal size, non-tender, without  nodularity LYMPH:  no palpable lymphadenopathy in the cervical, axillary or inguinal LUNGS: clear to auscultation and percussion with normal breathing effort HEART: regular  rate & rhythm and no murmurs and no lower extremity edema ABDOMEN:abdomen soft, non-tender and normal bowel sounds Musculoskeletal:no cyanosis of digits and no clubbing  NEURO: alert & oriented x 3 with fluent speech, no focal motor/sensory deficits BREAST: No palpable masses or nodules in either right or left breasts. No palpable axillary supraclavicular or infraclavicular adenopathy no breast tenderness or nipple discharge. (exam performed in the presence of a chaperone)  LABORATORY DATA:  I have reviewed the data as listed   Chemistry      Component Value Date/Time   NA 141 10/03/2014 0856   NA 140 08/01/2014 0918   NA 135* 06/11/2013 0408   K 4.1 10/03/2014 0856   K 4.0 08/01/2014 0918   K 3.2* 06/11/2013 0408   CL 107 08/01/2014 0918   CL 102 06/11/2013 0408   CO2 28 10/03/2014 0856   CO2 28 08/01/2014 0918   CO2 26 06/11/2013 0408   BUN 15.5 10/03/2014 0856   BUN 12 08/01/2014 0918   BUN 15 06/11/2013 0408   CREATININE 1.0 10/03/2014 0856   CREATININE 0.93 08/01/2014 0918   CREATININE 1.16 06/11/2013 0408      Component Value Date/Time   CALCIUM 8.9 10/03/2014 0856   CALCIUM 8.8 08/01/2014 0918   CALCIUM 8.7 06/11/2013 0408   ALKPHOS 63 10/03/2014 0856   ALKPHOS 54 08/01/2014 0918   ALKPHOS 75 06/12/2013 1541   AST 25 10/03/2014 0856   AST 41* 08/01/2014 0918   AST 137* 06/12/2013 1541   ALT 27 10/03/2014 0856   ALT 54* 08/01/2014 0918   ALT 192* 06/12/2013 1541   BILITOT 0.37 10/03/2014 0856   BILITOT 0.2* 08/01/2014 0918       Lab Results  Component Value Date   WBC 9.0 10/18/2014   HGB 11.0* 10/18/2014   HCT 33.1* 10/18/2014   MCV 96.8 10/18/2014   PLT 370 10/18/2014   NEUTROABS 5.5 10/18/2014   ASSESSMENT & PLAN:  Breast cancer of lower-inner quadrant of right female breast Right breast lumpectomy 06/18/2014: Invasive ductal carcinoma 1.3 cm negative for LVI; DCIS 1 mm from margins, 1 sentinel node negative, grade 3 ER 95%, PR 77%, HER-2 amplified  ratio 8.25, Ki-67 43% T1 cN0 M0 stage IA  Chemotherapy summary S/P Adjuvant chemo with Abraxane Herceptin started 07/18/2014 completed 10/03/14 Chemo toxicities: Neuropathy grade 1, Anemia, alopecia, ankle edema  Plan: 1. Continue maintenance Herceptin q 3 weeks 2. Adjuvant XRT  RTC every 6 weeks for follow up   No orders of the defined types were placed in this encounter.   The patient has a good understanding of the overall plan. she agrees with it. she will call with any problems that may develop before the next visit here.   Rulon Eisenmenger, MD

## 2014-10-18 NOTE — Addendum Note (Signed)
Addended by: Prentiss Bells on: 10/18/2014 12:34 PM   Modules accepted: Medications

## 2014-10-22 ENCOUNTER — Ambulatory Visit
Admission: RE | Admit: 2014-10-22 | Discharge: 2014-10-22 | Disposition: A | Discharge status: Home / Self Care | Payer: 59 | Source: Ambulatory Visit | Attending: Radiation Oncology | Admitting: Radiation Oncology

## 2014-10-22 ENCOUNTER — Encounter: Payer: Self-pay | Admitting: *Deleted

## 2014-10-22 DIAGNOSIS — C50311 Malignant neoplasm of lower-inner quadrant of right female breast: Secondary | ICD-10-CM | POA: Diagnosis not present

## 2014-10-22 NOTE — Progress Notes (Signed)
Patient walked in to clinic. Wanted Dr. Lindi Adie to know that she has had muscle aches "like I've worked out" for 2-4 weeks. Patient done with chemotherapy, just receiving Herceptin. Dr. Lindi Adie made aware.

## 2014-10-23 NOTE — Progress Notes (Signed)
  Radiation Oncology         [phone] ________________________________  Name: Krystal Kelley MRN: [id number]  Date: 10/22/2014  DOB: Nov 04, 1970  SIMULATION AND TREATMENT PLANNING NOTE    ICD-9-CM ICD-10-CM   1. Breast cancer of lower-inner quadrant of right female breast 174.3 C50.311     DIAGNOSIS:      Breast cancer of lower-inner quadrant of right female breast   05/16/2014 Initial Diagnosis Ultrasound-guided biopsy: Invasive ductal carcinoma with DCIS, grade 1-2, ER/PR positive, Ki-67 43%, HER-2 positive   05/28/2014 Breast MRI Right breast: 1.4 x 1.2 x 1 cm oval enhancing mass lower inner quadrant   06/18/2014 Surgery Right breast lumpectomy: Invasive ductal carcinoma 1.3 cm negative for LVI; DCIS 1 mm from margins, 1 sentinel node negative, grade 3 ER 95%, PR 77%, HER-2 amplified ratio 8.25, Ki-67 43% T1 cN0 M0 stage IA   07/18/2014 - 10/03/2014 Chemotherapy Abraxane Herceptin weekly 12 followed by Herceptin maintenance every 3 weeks    NARRATIVE:  The patient was brought to the [facility].  Identity was confirmed.  All relevant records and images related to the planned course of therapy were reviewed.  The patient freely provided informed written consent to proceed with treatment after reviewing the details related to the planned course of therapy. The consent form was witnessed and verified by the simulation staff.  Then, the patient was set-up in a stable reproducible  supine position for radiation therapy.  CT images were obtained.  Surface markings were placed.  The CT images were loaded into the planning software.  Then the target and avoidance structures were contoured.  Treatment planning then occurred.  The radiation prescription was entered and confirmed.  Then, I designed and supervised the construction of a total of 3 medically necessary complex treatment devices.  I have requested : 3D Simulation  I have requested a DVH of the following structures: Breast,  lumpectomy cavity, lungs.  I have ordered:dose calc.  PLAN:  The patient will receive 50.4 Gy in 28 fractions followed by a boost to the lumpectomy cavity of 12 gray and a cumulative dose of 62.4 gray  ________________________________  -----------------------------------  Blair Promise, PhD, MD

## 2014-10-24 ENCOUNTER — Encounter: Payer: Self-pay | Admitting: Hematology and Oncology

## 2014-10-24 ENCOUNTER — Ambulatory Visit (HOSPITAL_BASED_OUTPATIENT_CLINIC_OR_DEPARTMENT_OTHER): Payer: 59

## 2014-10-24 ENCOUNTER — Other Ambulatory Visit (HOSPITAL_BASED_OUTPATIENT_CLINIC_OR_DEPARTMENT_OTHER): Payer: 59

## 2014-10-24 ENCOUNTER — Encounter: Payer: Self-pay | Admitting: *Deleted

## 2014-10-24 DIAGNOSIS — C50311 Malignant neoplasm of lower-inner quadrant of right female breast: Secondary | ICD-10-CM | POA: Diagnosis not present

## 2014-10-24 DIAGNOSIS — Z5112 Encounter for antineoplastic immunotherapy: Secondary | ICD-10-CM

## 2014-10-24 LAB — COMPREHENSIVE METABOLIC PANEL (CC13)
ALT: 32 U/L (ref 0–55)
AST: 27 U/L (ref 5–34)
Albumin: 3.4 g/dL — ABNORMAL LOW (ref 3.5–5.0)
Alkaline Phosphatase: 70 U/L (ref 40–150)
Anion Gap: 8 meq/L (ref 3–11)
BUN: 12.2 mg/dL (ref 7.0–26.0)
CO2: 27 mEq/L (ref 22–29)
Calcium: 9.4 mg/dL (ref 8.4–10.4)
Chloride: 107 meq/L (ref 98–109)
Creatinine: 1 mg/dL (ref 0.6–1.1)
EGFR: 83 ml/min/1.73 m2 — ABNORMAL LOW (ref >90)
Glucose: 100 mg/dl (ref 70–140)
Potassium: 4.1 mEq/L (ref 3.5–5.1)
Sodium: 142 mEq/L (ref 136–145)
Total Bilirubin: 0.29 mg/dL (ref 0.20–1.20)
Total Protein: 6.8 g/dL (ref 6.4–8.3)

## 2014-10-24 LAB — CBC WITH DIFFERENTIAL/PLATELET
BASO%: 0.2 % (ref 0.0–2.0)
Basophils Absolute: 0 10*3/uL (ref 0.0–0.1)
EOS%: 1.6 % (ref 0.0–7.0)
Eosinophils Absolute: 0.2 10*3/uL (ref 0.0–0.5)
HCT: 34 % — ABNORMAL LOW (ref 34.8–46.6)
HGB: 11.3 g/dL — ABNORMAL LOW (ref 11.6–15.9)
LYMPH%: 26.6 % (ref 14.0–49.7)
MCH: 32.3 pg (ref 25.1–34.0)
MCHC: 33.2 g/dL (ref 31.5–36.0)
MCV: 97.1 fL (ref 79.5–101.0)
MONO#: 0.7 10e3/uL (ref 0.1–0.9)
MONO%: 8 % (ref 0.0–14.0)
NEUT#: 5.8 10*3/uL (ref 1.5–6.5)
NEUT%: 63.6 % (ref 38.4–76.8)
Platelets: 352 10*3/uL (ref 145–400)
RBC: 3.5 10*6/uL — ABNORMAL LOW (ref 3.70–5.45)
RDW: 16.2 % — ABNORMAL HIGH (ref 11.2–14.5)
WBC: 9.2 10e3/uL (ref 3.9–10.3)
lymph#: 2.4 10e3/uL (ref 0.9–3.3)

## 2014-10-24 MED ORDER — ACETAMINOPHEN 325 MG PO TABS
650.0000 mg | ORAL_TABLET | Freq: Once | ORAL | Status: AC
  Administered 2014-10-24: 650 mg via ORAL

## 2014-10-24 MED ORDER — ACETAMINOPHEN 325 MG PO TABS
ORAL_TABLET | ORAL | Status: AC
  Filled 2014-10-24: qty 2

## 2014-10-24 MED ORDER — HEPARIN SOD (PORK) LOCK FLUSH 100 UNIT/ML IV SOLN
500.0000 [IU] | Freq: Once | INTRAVENOUS | Status: AC | PRN
  Administered 2014-10-24: 500 [IU]
  Filled 2014-10-24: qty 5

## 2014-10-24 MED ORDER — SODIUM CHLORIDE 0.9 % IJ SOLN
10.0000 mL | INTRAMUSCULAR | Status: DC | PRN
  Administered 2014-10-24: 10 mL
  Filled 2014-10-24: qty 10

## 2014-10-24 MED ORDER — SODIUM CHLORIDE 0.9 % IV SOLN
Freq: Once | INTRAVENOUS | Status: AC
  Administered 2014-10-24: 13:00:00 via INTRAVENOUS

## 2014-10-24 MED ORDER — DIPHENHYDRAMINE HCL 25 MG PO CAPS
50.0000 mg | ORAL_CAPSULE | Freq: Once | ORAL | Status: AC
  Administered 2014-10-24: 50 mg via ORAL

## 2014-10-24 MED ORDER — TRASTUZUMAB CHEMO INJECTION 440 MG
8.0000 mg/kg | Freq: Once | INTRAVENOUS | Status: AC
  Administered 2014-10-24: 903 mg via INTRAVENOUS
  Filled 2014-10-24: qty 43

## 2014-10-24 MED ORDER — DIPHENHYDRAMINE HCL 25 MG PO CAPS
ORAL_CAPSULE | ORAL | Status: AC
  Filled 2014-10-24: qty 2

## 2014-10-24 NOTE — Addendum Note (Signed)
Encounter addended by: Gery Pray, MD on: 10/24/2014  7:20 PM<BR>     Documentation filed: Notes Section

## 2014-10-24 NOTE — Progress Notes (Signed)
I faxed the [employer] forms  [phone]

## 2014-10-24 NOTE — Progress Notes (Signed)
See prev notes. I faxed labs/notes as well

## 2014-10-24 NOTE — Progress Notes (Signed)
  Radiation Oncology         [phone] ________________________________  Name: Krystal Kelley MRN: [id number]  Date: 10/22/2014  DOB: 07-20-1970  Optical Surface Tracking Plan:  Since intensity modulated radiotherapy (IMRT) and 3D conformal radiation treatment methods are predicated on accurate and precise positioning for treatment, intrafraction motion monitoring is medically necessary to ensure accurate and safe treatment delivery.  The ability to quantify intrafraction motion without excessive ionizing radiation dose can only be performed with optical surface tracking. Accordingly, surface imaging offers the opportunity to obtain 3D measurements of patient position throughout IMRT and 3D treatments without excessive radiation exposure.  I am ordering optical surface tracking for this patient's upcoming course of radiotherapy. ________________________________  Gery Pray, MD 10/24/2014 7:19 PM    Reference:   Particia Jasper, et al. Surface imaging-based analysis of intrafraction motion for breast radiotherapy patients.Journal of [place], n. 6, nov. 2014. ISSN [id number].   Available at: <http://www.jacmp.org/index.php/jacmp/article/view/4957>.

## 2014-10-24 NOTE — Patient Instructions (Signed)
[facility] Discharge Instructions for Patients Receiving Chemotherapy  Today you received the following chemotherapy agents: Herceptin   If you develop nausea and vomiting that is not controlled by your nausea medication, call the clinic.   BELOW ARE SYMPTOMS THAT SHOULD BE REPORTED IMMEDIATELY:  *FEVER GREATER THAN 100.5 F  *CHILLS WITH OR WITHOUT FEVER  NAUSEA AND VOMITING THAT IS NOT CONTROLLED WITH YOUR NAUSEA MEDICATION  *UNUSUAL SHORTNESS OF BREATH  *UNUSUAL BRUISING OR BLEEDING  TENDERNESS IN MOUTH AND THROAT WITH OR WITHOUT PRESENCE OF ULCERS  *URINARY PROBLEMS  *BOWEL PROBLEMS  UNUSUAL RASH Items with * indicate a potential emergency and should be followed up as soon as possible.  Feel free to call the clinic should you have any questions or concerns. The clinic phone number is [phone].  Please show the CHEMO ALERT CARD at check-in to the Emergency Department and triage nurse.   

## 2014-10-29 ENCOUNTER — Ambulatory Visit
Admission: RE | Admit: 2014-10-29 | Discharge: 2014-10-29 | Disposition: A | Discharge status: Home / Self Care | Payer: 59 | Source: Ambulatory Visit | Attending: Radiation Oncology | Admitting: Radiation Oncology

## 2014-10-29 ENCOUNTER — Ambulatory Visit: Payer: 59

## 2014-10-29 DIAGNOSIS — C50311 Malignant neoplasm of lower-inner quadrant of right female breast: Secondary | ICD-10-CM | POA: Diagnosis not present

## 2014-10-29 NOTE — Progress Notes (Signed)
  Radiation Oncology         [phone] ________________________________  Name: Krystal Kelley MRN: [id number]  Date: 10/29/2014  DOB: July 28, 1970  Simulation Verification Note    ICD-9-CM ICD-10-CM   1. Breast cancer of lower-inner quadrant of right female breast 174.3 C50.311     Status: outpatient  NARRATIVE: The patient was brought to the treatment unit and placed in the planned treatment position. The clinical setup was verified. Then port films were obtained and uploaded to the radiation oncology medical record software.  The treatment beams were carefully compared against the planned radiation fields. The position location and shape of the radiation fields was reviewed. They targeted volume of tissue appears to be appropriately covered by the radiation beams. Organs at risk appear to be excluded as planned.  Based on my personal review, I approved the simulation verification. The patient's treatment will proceed as planned.  -----------------------------------  Blair Promise, PhD, MD

## 2014-10-30 ENCOUNTER — Ambulatory Visit: Payer: 59

## 2014-10-30 ENCOUNTER — Ambulatory Visit
Admission: RE | Admit: 2014-10-30 | Discharge: 2014-10-30 | Disposition: A | Discharge status: Home / Self Care | Payer: 59 | Source: Ambulatory Visit | Attending: Radiation Oncology | Admitting: Radiation Oncology

## 2014-10-30 DIAGNOSIS — C50311 Malignant neoplasm of lower-inner quadrant of right female breast: Secondary | ICD-10-CM | POA: Diagnosis not present

## 2014-10-31 ENCOUNTER — Ambulatory Visit
Admission: RE | Admit: 2014-10-31 | Discharge: 2014-10-31 | Disposition: A | Discharge status: Home / Self Care | Payer: 59 | Source: Ambulatory Visit | Attending: Radiation Oncology | Admitting: Radiation Oncology

## 2014-10-31 DIAGNOSIS — C50311 Malignant neoplasm of lower-inner quadrant of right female breast: Secondary | ICD-10-CM | POA: Diagnosis not present

## 2014-11-01 ENCOUNTER — Ambulatory Visit
Admission: RE | Admit: 2014-11-01 | Discharge: 2014-11-01 | Disposition: A | Discharge status: Home / Self Care | Payer: 59 | Source: Ambulatory Visit | Attending: Radiation Oncology | Admitting: Radiation Oncology

## 2014-11-01 DIAGNOSIS — C50311 Malignant neoplasm of lower-inner quadrant of right female breast: Secondary | ICD-10-CM | POA: Diagnosis not present

## 2014-11-04 ENCOUNTER — Ambulatory Visit
Admission: RE | Admit: 2014-11-04 | Discharge: 2014-11-04 | Disposition: A | Discharge status: Home / Self Care | Payer: 59 | Source: Ambulatory Visit | Attending: Radiation Oncology | Admitting: Radiation Oncology

## 2014-11-04 DIAGNOSIS — C50311 Malignant neoplasm of lower-inner quadrant of right female breast: Secondary | ICD-10-CM | POA: Diagnosis not present

## 2014-11-05 ENCOUNTER — Ambulatory Visit
Admission: RE | Admit: 2014-11-05 | Discharge: 2014-11-05 | Disposition: A | Discharge status: Home / Self Care | Payer: 59 | Source: Ambulatory Visit | Attending: Radiation Oncology | Admitting: Radiation Oncology

## 2014-11-05 ENCOUNTER — Encounter: Payer: Self-pay | Admitting: Radiation Oncology

## 2014-11-05 VITALS — BP 129/80 | HR 90 | Temp 98.6°F | Resp 20 | Wt 244.9 lb

## 2014-11-05 DIAGNOSIS — C50311 Malignant neoplasm of lower-inner quadrant of right female breast: Secondary | ICD-10-CM

## 2014-11-05 MED ORDER — ALRA NON-METALLIC DEODORANT (RAD-ONC)
1.0000 "application " | Freq: Once | TOPICAL | Status: AC
  Administered 2014-11-05: 1 via TOPICAL

## 2014-11-05 MED ORDER — RADIAPLEXRX EX GEL
Freq: Once | CUTANEOUS | Status: AC
  Administered 2014-11-05: 14:00:00 via TOPICAL

## 2014-11-05 NOTE — Progress Notes (Signed)
  Radiation Oncology         [phone] ________________________________  Name: SHANAIA SIEVERS MRN: [id number]  Date: 11/05/2014  DOB: [birth year]  Weekly Radiation Therapy Management    ICD-9-CM ICD-10-CM   1. Breast cancer of lower-inner quadrant of right female breast 174.3 Q22.979 non-metallic deodorant (ALRA) 1 application     hyaluronate sodium (RADIAPLEXRX) gel    Current Dose: 9 Gy     Planned Dose:  62.4 Gy  Narrative . . . . . . . . The patient presents for routine under treatment assessment.                                   The patient is without complaint except for her nipple being sore.                                 Set-up films were reviewed.                                 The chart was checked. Physical Findings. . .  weight is 244 lb 14.4 oz (111.086 kg). Her oral temperature is 98.6 F (37 C). Her blood pressure is 129/80 and her pulse is 90. Her respiration is 20. .  Lungs are clear. Heart has regular rate and rhythm. No palpable cervical, supraclavicular, or axillary adenopathy. Mild hyperpigmentation changes noted in the right breast  Impression . . . . . . . The patient is tolerating radiation. Plan . . . . . . . . . . . . Continue treatment as planned.  ________________________________   Blair Promise, PhD, MD

## 2014-11-05 NOTE — Progress Notes (Deleted)
Weekly rad tx  Right breast 5/34  Completed,

## 2014-11-05 NOTE — Progress Notes (Addendum)
weekly rad tx rt breast 5/34 , pt education done, nipple tender,  Gave radiaplex,alra, rad book, [employer] business card, flyer on skin products, discussed ways to manage side effects, skin irritation,pain, fatigue,, increase protein in diet, drink plenty water,stay hydrated, tenderness swelling in breast,verbal understanding, teach back given ,  BP 129/80 mmHg  Pulse 90  Temp(Src) 98.6 F (37 C) (Oral)  Resp 20  Wt 244 lb 14.4 oz (111.086 kg)

## 2014-11-06 ENCOUNTER — Ambulatory Visit
Admission: RE | Admit: 2014-11-06 | Discharge: 2014-11-06 | Disposition: A | Discharge status: Home / Self Care | Payer: 59 | Source: Ambulatory Visit | Attending: Radiation Oncology | Admitting: Radiation Oncology

## 2014-11-06 DIAGNOSIS — C50311 Malignant neoplasm of lower-inner quadrant of right female breast: Secondary | ICD-10-CM | POA: Diagnosis not present

## 2014-11-07 ENCOUNTER — Ambulatory Visit
Admission: RE | Admit: 2014-11-07 | Discharge: 2014-11-07 | Disposition: A | Discharge status: Home / Self Care | Payer: 59 | Source: Ambulatory Visit | Attending: Radiation Oncology | Admitting: Radiation Oncology

## 2014-11-07 DIAGNOSIS — C50311 Malignant neoplasm of lower-inner quadrant of right female breast: Secondary | ICD-10-CM | POA: Diagnosis not present

## 2014-11-08 ENCOUNTER — Ambulatory Visit
Admission: RE | Admit: 2014-11-08 | Discharge: 2014-11-08 | Disposition: A | Discharge status: Home / Self Care | Payer: 59 | Source: Ambulatory Visit | Attending: Radiation Oncology | Admitting: Radiation Oncology

## 2014-11-08 DIAGNOSIS — C50311 Malignant neoplasm of lower-inner quadrant of right female breast: Secondary | ICD-10-CM | POA: Diagnosis not present

## 2014-11-11 ENCOUNTER — Ambulatory Visit
Admission: RE | Admit: 2014-11-11 | Discharge: 2014-11-11 | Disposition: A | Discharge status: Home / Self Care | Payer: 59 | Source: Ambulatory Visit | Attending: Radiation Oncology | Admitting: Radiation Oncology

## 2014-11-11 ENCOUNTER — Encounter: Payer: Self-pay | Admitting: Radiation Oncology

## 2014-11-11 DIAGNOSIS — C50311 Malignant neoplasm of lower-inner quadrant of right female breast: Secondary | ICD-10-CM | POA: Diagnosis not present

## 2014-11-11 NOTE — Progress Notes (Signed)
Rec'd from patient, from the [employer] is an Event organiser form given to the nurse, Santiago Glad, to fill out.

## 2014-11-12 ENCOUNTER — Ambulatory Visit
Admission: RE | Admit: 2014-11-12 | Discharge: 2014-11-12 | Disposition: A | Discharge status: Home / Self Care | Payer: 59 | Source: Ambulatory Visit | Attending: Radiation Oncology | Admitting: Radiation Oncology

## 2014-11-12 VITALS — BP 127/84 | HR 82 | Temp 98.7°F | Wt 248.1 lb

## 2014-11-12 DIAGNOSIS — C50311 Malignant neoplasm of lower-inner quadrant of right female breast: Secondary | ICD-10-CM

## 2014-11-12 NOTE — Progress Notes (Signed)
Completed 10 of 28 fractions to right breast.Denies pain.Mild tanning of skin.No concerns voiced.

## 2014-11-12 NOTE — Progress Notes (Signed)
  Radiation Oncology         (336) [phone] ________________________________  Name: Krystal Kelley MRN: [id number]  Date: 11/12/2014  DOB: March 12, 1971  Weekly Radiation Therapy Management    ICD-9-CM ICD-10-CM   1. Breast cancer of lower-inner quadrant of right female breast 174.3 L89.211 non-metallic deodorant (ALRA) 1 application     hyaluronate sodium (RADIAPLEXRX) gel    Current Dose: 18 Gy     Planned Dose:  62.4 Gy  Narrative . . . . . . . . The patient presents for routine under treatment assessment.                                   The patient has no complaints. Denies pain. Mild tanning of skin. No concerns voiced.                                 Set-up films were reviewed.                                 The chart was checked. Physical Findings. . .  weight is 248 lb 1.6 oz (112.537 kg). Her temperature is 98.7 F (37.1 C). Her blood pressure is 127/84 and her pulse is 82. .  Lungs are clear. Heart has regular rate and rhythm. No palpable cervical, supraclavicular, or axillary adenopathy. Mild hyperpigmentation changes noted in the right breast.  Impression . . . . . . . The patient is tolerating radiation. Plan . . . . . . . . . . . . Continue treatment as planned.  This document serves as a record of services personally performed by Gery Pray, MD. It was created on his behalf by Arlyce Harman, a trained medical scribe. The creation of this record is based on the scribe's personal observations and the provider's statements to them. This document has been checked and approved by the attending provider. ________________________________   Blair Promise, PhD, MD

## 2014-11-13 ENCOUNTER — Ambulatory Visit
Admission: RE | Admit: 2014-11-13 | Discharge: 2014-11-13 | Disposition: A | Discharge status: Home / Self Care | Payer: 59 | Source: Ambulatory Visit | Attending: Radiation Oncology | Admitting: Radiation Oncology

## 2014-11-13 DIAGNOSIS — C50311 Malignant neoplasm of lower-inner quadrant of right female breast: Secondary | ICD-10-CM | POA: Diagnosis not present

## 2014-11-13 NOTE — Assessment & Plan Note (Signed)
Right breast lumpectomy 06/18/2014: Invasive ductal carcinoma 1.3 cm negative for LVI; DCIS 1 mm from margins, 1 sentinel node negative, grade 3 ER 95%, PR 77%, HER-2 amplified ratio 8.25, Ki-67 43% T1 cN0 M0 stage IA  Chemotherapy summary S/P Adjuvant chemo with Abraxane Herceptin started 07/18/2014 completed 10/03/14 Chemo toxicities: Neuropathy grade 1, Anemia, alopecia, ankle edema  Plan: 1. Continue maintenance Herceptin q 3 weeks 2. Adjuvant XRT  RTC every 6 weeks for follow up

## 2014-11-14 ENCOUNTER — Ambulatory Visit (HOSPITAL_BASED_OUTPATIENT_CLINIC_OR_DEPARTMENT_OTHER): Payer: 59 | Admitting: Hematology and Oncology

## 2014-11-14 ENCOUNTER — Encounter: Payer: Self-pay | Admitting: Hematology and Oncology

## 2014-11-14 ENCOUNTER — Ambulatory Visit
Admission: RE | Admit: 2014-11-14 | Discharge: 2014-11-14 | Disposition: A | Discharge status: Home / Self Care | Payer: 59 | Source: Ambulatory Visit | Attending: Radiation Oncology | Admitting: Radiation Oncology

## 2014-11-14 ENCOUNTER — Ambulatory Visit (HOSPITAL_BASED_OUTPATIENT_CLINIC_OR_DEPARTMENT_OTHER): Payer: 59

## 2014-11-14 ENCOUNTER — Other Ambulatory Visit (HOSPITAL_BASED_OUTPATIENT_CLINIC_OR_DEPARTMENT_OTHER): Payer: 59

## 2014-11-14 ENCOUNTER — Telehealth: Payer: Self-pay | Admitting: Hematology and Oncology

## 2014-11-14 VITALS — BP 151/82 | HR 87 | Temp 98.8°F | Resp 18 | Ht 65.0 in | Wt 249.2 lb

## 2014-11-14 DIAGNOSIS — Z5112 Encounter for antineoplastic immunotherapy: Secondary | ICD-10-CM

## 2014-11-14 DIAGNOSIS — C50311 Malignant neoplasm of lower-inner quadrant of right female breast: Secondary | ICD-10-CM | POA: Diagnosis not present

## 2014-11-14 DIAGNOSIS — R600 Localized edema: Secondary | ICD-10-CM | POA: Diagnosis not present

## 2014-11-14 LAB — COMPREHENSIVE METABOLIC PANEL (CC13)
ALT: 30 U/L (ref 0–55)
AST: 29 U/L (ref 5–34)
Albumin: 3.3 g/dL — ABNORMAL LOW (ref 3.5–5.0)
Alkaline Phosphatase: 70 U/L (ref 40–150)
Anion Gap: 5 mEq/L (ref 3–11)
BUN: 12.3 mg/dL (ref 7.0–26.0)
CO2: 27 meq/L (ref 22–29)
Calcium: 9.1 mg/dL (ref 8.4–10.4)
Chloride: 108 meq/L (ref 98–109)
Creatinine: 0.9 mg/dL (ref 0.6–1.1)
EGFR: >90 ml/min/1.73 m2 (ref >90)
Glucose: 91 mg/dl (ref 70–140)
Potassium: 4 mEq/L (ref 3.5–5.1)
Sodium: 141 mEq/L (ref 136–145)
Total Bilirubin: 0.27 mg/dL (ref 0.20–1.20)
Total Protein: 6.8 g/dL (ref 6.4–8.3)

## 2014-11-14 LAB — CBC WITH DIFFERENTIAL/PLATELET
BASO%: 0.6 % (ref 0.0–2.0)
Basophils Absolute: 0.1 10*3/uL (ref 0.0–0.1)
EOS%: 1.8 % (ref 0.0–7.0)
Eosinophils Absolute: 0.1 10*3/uL (ref 0.0–0.5)
HCT: 35.2 % (ref 34.8–46.6)
HGB: 11.8 g/dL (ref 11.6–15.9)
LYMPH%: 19.6 % (ref 14.0–49.7)
MCH: 32 pg (ref 25.1–34.0)
MCHC: 33.6 g/dL (ref 31.5–36.0)
MCV: 95.4 fL (ref 79.5–101.0)
MONO#: 0.9 10*3/uL (ref 0.1–0.9)
MONO%: 10.7 % (ref 0.0–14.0)
NEUT#: 5.4 10*3/uL (ref 1.5–6.5)
NEUT%: 67.3 % (ref 38.4–76.8)
Platelets: 331 10*3/uL (ref 145–400)
RBC: 3.69 10e6/uL — ABNORMAL LOW (ref 3.70–5.45)
RDW: 15.9 % — ABNORMAL HIGH (ref 11.2–14.5)
WBC: 8 10e3/uL (ref 3.9–10.3)
lymph#: 1.6 10e3/uL (ref 0.9–3.3)

## 2014-11-14 MED ORDER — FUROSEMIDE 20 MG PO TABS: 20.0000 mg | ORAL_TABLET | Freq: Every day | ORAL | Status: DC

## 2014-11-14 MED ORDER — TRASTUZUMAB CHEMO INJECTION 440 MG
6.0000 mg/kg | Freq: Once | INTRAVENOUS | Status: AC
  Administered 2014-11-14: 672 mg via INTRAVENOUS
  Filled 2014-11-14: qty 32

## 2014-11-14 MED ORDER — ACETAMINOPHEN 325 MG PO TABS
650.0000 mg | ORAL_TABLET | Freq: Once | ORAL | Status: AC
  Administered 2014-11-14: 650 mg via ORAL

## 2014-11-14 MED ORDER — SODIUM CHLORIDE 0.9 % IV SOLN: Freq: Once | INTRAVENOUS | Status: DC

## 2014-11-14 MED ORDER — ACETAMINOPHEN 325 MG PO TABS
ORAL_TABLET | ORAL | Status: AC
  Filled 2014-11-14: qty 2

## 2014-11-14 MED ORDER — DIPHENHYDRAMINE HCL 25 MG PO CAPS
ORAL_CAPSULE | ORAL | Status: AC
  Filled 2014-11-14: qty 2

## 2014-11-14 MED ORDER — DIPHENHYDRAMINE HCL 25 MG PO CAPS
50.0000 mg | ORAL_CAPSULE | Freq: Once | ORAL | Status: AC
  Administered 2014-11-14: 50 mg via ORAL

## 2014-11-14 MED ORDER — SODIUM CHLORIDE 0.9 % IJ SOLN
10.0000 mL | INTRAMUSCULAR | Status: DC | PRN
  Filled 2014-11-14: qty 10

## 2014-11-14 MED ORDER — HEPARIN SOD (PORK) LOCK FLUSH 100 UNIT/ML IV SOLN
500.0000 [IU] | Freq: Once | INTRAVENOUS | Status: DC | PRN
  Filled 2014-11-14: qty 5

## 2014-11-14 NOTE — Progress Notes (Signed)
Patient Care Team: Albina Billet, MD as PCP - General (Internal Medicine) Albina Billet, MD (Internal Medicine)  DIAGNOSIS: No matching staging information was found for the patient.  SUMMARY OF ONCOLOGIC HISTORY:   Breast cancer of lower-inner quadrant of right female breast   05/16/2014 Initial Diagnosis Ultrasound-guided biopsy: Invasive ductal carcinoma with DCIS, grade 1-2, ER/PR positive, Ki-67 43%, HER-2 positive   05/28/2014 Breast MRI Right breast: 1.4 x 1.2 x 1 cm oval enhancing mass lower inner quadrant   06/18/2014 Surgery Right breast lumpectomy: Invasive ductal carcinoma 1.3 cm negative for LVI; DCIS 1 mm from margins, 1 sentinel node negative, grade 3 ER 95%, PR 77%, HER-2 amplified ratio 8.25, Ki-67 43% T1 cN0 M0 stage IA   07/18/2014 - 10/03/2014 Chemotherapy Abraxane Herceptin weekly 12 followed by Herceptin maintenance every 3 weeks   11/01/2014 -  Radiation Therapy Adjuvant radiation therapy    CHIEF COMPLIANT: Currently on adjuvant radiation and Herceptin maintenance  INTERVAL HISTORY: Krystal Kelley is a 44 year old with above-mentioned history of right breast cancer currently on adjuvant radiation therapy. She appears to be tolerating radiation fairly well. She was very reluctant at first to even consider radiation therapy. She was diagnosed with superficial thrombophlebitis and is complaining of right leg swelling more than the left. She is also gaining a lot of weight overall skin 10 pounds.  REVIEW OF SYSTEMS:   Constitutional: Denies fevers, chills or abnormal weight loss Eyes: Denies blurriness of vision Ears, nose, mouth, throat, and face: Denies mucositis or sore throat Respiratory: Denies cough, dyspnea or wheezes Cardiovascular: Denies palpitation, chest discomfort complains of lower extremity swelling Gastrointestinal:  Denies nausea, heartburn or change in bowel habits Skin: Denies abnormal skin rashes Lymphatics: Denies new lymphadenopathy or easy  bruising Neurological:Denies numbness, tingling or new weaknesses Behavioral/Psych: Mood is stable, no new changes  Breast:  denies any pain or lumps or nodules in either breasts All other systems were reviewed with the patient and are negative.  I have reviewed the past medical history, past surgical history, social history and family history with the patient and they are unchanged from previous note.  ALLERGIES:  has No Known Allergies.  MEDICATIONS:  Current Outpatient Prescriptions  Medication Sig Dispense Refill  . chlorpheniramine-HYDROcodone (TUSSIONEX) 10-8 MG/5ML LQCR Take 5 mLs by mouth every 12 (twelve) hours as needed for cough.   0  . hyaluronate sodium (RADIAPLEXRX) GEL Apply 1 application topically 2 (two) times daily.    . hydrochlorothiazide (HYDRODIURIL) 25 MG tablet Take 25 mg by mouth daily.    [name] lidocaine-prilocaine (EMLA) cream Apply 1 application topically as needed. 30 g 0  . loperamide (IMODIUM) 2 MG capsule Take 2 mg by mouth as needed for diarrhea or loose stools.    . non-metallic deodorant Jethro Poling) MISC Apply 1 application topically daily as needed.    . ondansetron (ZOFRAN) 8 MG tablet TAKE 1 TABLET BY MOUTH TWICE A DAY . START THE DAY AFTER CHEMO FOR 2 DAYS, THEN USE AS NEEDED  1  . prochlorperazine (COMPAZINE) 10 MG tablet Take 1 tablet (10 mg total) by mouth every 6 (six) hours as needed (Nausea or vomiting). (Patient not taking: Reported on 11/05/2014) 30 tablet 1  . RESTASIS 0.05 % ophthalmic emulsion INSTILL ONE DROP IN AFFECTED EYE 2 TIMES A DAY  4   No current facility-administered medications for this visit.    PHYSICAL EXAMINATION: ECOG PERFORMANCE STATUS: 1 - Symptomatic but completely ambulatory  There were no vitals filed for  this visit. There were no vitals filed for this visit.  GENERAL:alert, no distress and comfortable SKIN: skin color, texture, turgor are normal, no rashes or significant lesions EYES: normal, Conjunctiva are pink and  non-injected, sclera clear OROPHARYNX:no exudate, no erythema and lips, buccal mucosa, and tongue normal  NECK: supple, thyroid normal size, non-tender, without nodularity LYMPH:  no palpable lymphadenopathy in the cervical, axillary or inguinal LUNGS: clear to auscultation and percussion with normal breathing effort HEART: regular rate & rhythm and no murmurs and 1+ lower extremity edema right greater than left ABDOMEN:abdomen soft, non-tender and normal bowel sounds Musculoskeletal:no cyanosis of digits and no clubbing  NEURO: alert & oriented x 3 with fluent speech, no focal motor/sensory deficits   LABORATORY DATA:  I have reviewed the data as listed   Chemistry      Component Value Date/Time   NA 142 10/24/2014 1127   NA 140 08/01/2014 0918   NA 135* 06/11/2013 0408   K 4.1 10/24/2014 1127   K 4.0 08/01/2014 0918   K 3.2* 06/11/2013 0408   CL 107 08/01/2014 0918   CL 102 06/11/2013 0408   CO2 27 10/24/2014 1127   CO2 28 08/01/2014 0918   CO2 26 06/11/2013 0408   BUN 12.2 10/24/2014 1127   BUN 12 08/01/2014 0918   BUN 15 06/11/2013 0408   CREATININE 1.0 10/24/2014 1127   CREATININE 0.93 08/01/2014 0918   CREATININE 1.16 06/11/2013 0408      Component Value Date/Time   CALCIUM 9.4 10/24/2014 1127   CALCIUM 8.8 08/01/2014 0918   CALCIUM 8.7 06/11/2013 0408   ALKPHOS 70 10/24/2014 1127   ALKPHOS 54 08/01/2014 0918   ALKPHOS 75 06/12/2013 1541   AST 27 10/24/2014 1127   AST 41* 08/01/2014 0918   AST 137* 06/12/2013 1541   ALT 32 10/24/2014 1127   ALT 54* 08/01/2014 0918   ALT 192* 06/12/2013 1541   BILITOT 0.29 10/24/2014 1127   BILITOT 0.2* 08/01/2014 0918   BILITOT 0.5 06/12/2013 1541       Lab Results  Component Value Date   WBC 9.2 10/24/2014   HGB 11.3* 10/24/2014   HCT 34.0* 10/24/2014   MCV 97.1 10/24/2014   PLT 352 10/24/2014   NEUTROABS 5.8 10/24/2014   ASSESSMENT & PLAN:  Breast cancer of lower-inner quadrant of right female breast Right  breast lumpectomy 06/18/2014: Invasive ductal carcinoma 1.3 cm negative for LVI; DCIS 1 mm from margins, 1 sentinel node negative, grade 3 ER 95%, PR 77%, HER-2 amplified ratio 8.25, Ki-67 43% T1 cN0 M0 stage IA  Chemotherapy summary S/P Adjuvant chemo with Abraxane Herceptin started 07/18/2014 completed 10/03/14 Chemo toxicities: Neuropathy grade 1, Anemia, alopecia, ankle edema  Plan: 1. Continue maintenance Herceptin q 3 weeks 2. Adjuvant XRT 3. Once radiation is complete she will then start antiestrogen therapy with tamoxifen  Leg edema: I prescribed her Lasix today.  RTC every 6 weeks for follow up  No orders of the defined types were placed in this encounter.   The patient has a good understanding of the overall plan. she agrees with it. she will call with any problems that may develop before the next visit here.   Rulon Eisenmenger, MD

## 2014-11-14 NOTE — Patient Instructions (Signed)
[facility] Discharge Instructions for Patients Receiving Chemotherapy  Today you received the following chemotherapy agents herceptin  To help prevent nausea and vomiting after your treatment, we encourage you to take your nausea medication  If you develop nausea and vomiting that is not controlled by your nausea medication, call the clinic.   BELOW ARE SYMPTOMS THAT SHOULD BE REPORTED IMMEDIATELY:  *FEVER GREATER THAN 100.5 F  *CHILLS WITH OR WITHOUT FEVER  NAUSEA AND VOMITING THAT IS NOT CONTROLLED WITH YOUR NAUSEA MEDICATION  *UNUSUAL SHORTNESS OF BREATH  *UNUSUAL BRUISING OR BLEEDING  TENDERNESS IN MOUTH AND THROAT WITH OR WITHOUT PRESENCE OF ULCERS  *URINARY PROBLEMS  *BOWEL PROBLEMS  UNUSUAL RASH Items with * indicate a potential emergency and should be followed up as soon as possible.  Feel free to call the clinic you have any questions or concerns. The clinic phone number is [phone].  Please show the CHEMO ALERT CARD at check-in to the Emergency Department and triage nurse.   

## 2014-11-14 NOTE — Telephone Encounter (Signed)
appointments made and avs pritnted for patient

## 2014-11-15 ENCOUNTER — Encounter: Payer: Self-pay | Admitting: Radiation Oncology

## 2014-11-15 ENCOUNTER — Ambulatory Visit
Admission: RE | Admit: 2014-11-15 | Discharge: 2014-11-15 | Disposition: A | Discharge status: Home / Self Care | Payer: 59 | Source: Ambulatory Visit | Attending: Radiation Oncology | Admitting: Radiation Oncology

## 2014-11-15 DIAGNOSIS — C50311 Malignant neoplasm of lower-inner quadrant of right female breast: Secondary | ICD-10-CM | POA: Diagnosis not present

## 2014-11-15 NOTE — Progress Notes (Signed)
Faxed disability paperwork to the [employer] [phone])

## 2014-11-18 ENCOUNTER — Ambulatory Visit (INDEPENDENT_AMBULATORY_CARE_PROVIDER_SITE_OTHER): Payer: No Typology Code available for payment source | Admitting: Podiatry

## 2014-11-18 ENCOUNTER — Ambulatory Visit
Admission: RE | Admit: 2014-11-18 | Discharge: 2014-11-18 | Disposition: A | Discharge status: Home / Self Care | Payer: 59 | Source: Ambulatory Visit | Attending: Radiation Oncology | Admitting: Radiation Oncology

## 2014-11-18 DIAGNOSIS — G5792 Unspecified mononeuropathy of left lower limb: Secondary | ICD-10-CM | POA: Diagnosis not present

## 2014-11-18 DIAGNOSIS — Z510 Encounter for antineoplastic radiation therapy: Secondary | ICD-10-CM | POA: Diagnosis not present

## 2014-11-18 DIAGNOSIS — C50311 Malignant neoplasm of lower-inner quadrant of right female breast: Secondary | ICD-10-CM | POA: Insufficient documentation

## 2014-11-18 DIAGNOSIS — R609 Edema, unspecified: Secondary | ICD-10-CM

## 2014-11-18 NOTE — Progress Notes (Signed)
She presents today for follow-up of her neuritis left heel. She states that she has "chemotherapy feet".  Objective: Vital signs are stable she is alert and oriented 3 she has no pain on palpation of the bilateral foot. Moderate edema bilateral secondary to chemotherapy as well as hyperpigmentation around the toes and medial and lateral foot. No signs of infection however and no pain.  Assessment: Edema to the bilateral foot secondary to chemotherapy. Resolving neuritis left heel.  Plan: Follow up with me

## 2014-11-19 ENCOUNTER — Telehealth: Payer: Self-pay

## 2014-11-19 ENCOUNTER — Ambulatory Visit
Admission: RE | Admit: 2014-11-19 | Discharge: 2014-11-19 | Disposition: A | Discharge status: Home / Self Care | Payer: 59 | Source: Ambulatory Visit | Attending: Radiation Oncology | Admitting: Radiation Oncology

## 2014-11-19 ENCOUNTER — Encounter: Payer: Self-pay | Admitting: Radiation Oncology

## 2014-11-19 VITALS — BP 132/79 | HR 88 | Temp 98.5°F | Resp 16 | Ht 65.0 in | Wt 250.1 lb

## 2014-11-19 DIAGNOSIS — C50311 Malignant neoplasm of lower-inner quadrant of right female breast: Secondary | ICD-10-CM

## 2014-11-19 DIAGNOSIS — Z510 Encounter for antineoplastic radiation therapy: Secondary | ICD-10-CM | POA: Diagnosis not present

## 2014-11-19 NOTE — Progress Notes (Signed)
Krystal Kelley has completed 15 fractions to her right breast.  She denies having pain but does have some tenderness in her right nipple area.  She reports having bil lower leg swelling and has started taking lasix.  She denies fatigue.  The skin on her right breast has hyperpigmentation.  She is using radiaplex twice a day.  BP 132/79 mmHg  Pulse 88  Temp(Src) 98.5 F (36.9 C) (Oral)  Resp 16  Ht 5\' 5"  (1.651 m)  Wt 250 lb 1.6 oz (113.445 kg)  BMI 41.62 kg/m2

## 2014-11-19 NOTE — Progress Notes (Signed)
Weekly Management Note Current Dose:  27 Gy  Projected Dose: 62.4 Gy   Narrative:  The patient presents for routine under treatment assessment.  CBCT/MVCT images/Port film x-rays were reviewed.  The chart was checked. Tenderness of her right nipple. Bilateral leg swelling and she is taking lasix for that. Does not feel one leg is more swollen than the other. Denies fatigue.  Physical Findings: Weight: 250 lb 1.6 oz (113.445 kg). Breast has some hyperpigmentation.  Impression:  The patient is tolerating radiation.  Plan:  Continue treatment as planned and continue to use radiaplex. Continue to use lasix.  This document serves as a record of services personally performed by Thea Silversmith, MD. It was created on her behalf by Darcus Austin, a trained medical scribe. The creation of this record is based on the scribe's personal observations and the provider's statements to them. This document has been checked and approved by the attending provider.

## 2014-11-19 NOTE — Telephone Encounter (Signed)
Pr Dr. Lindi Adie, pt can increase lasix dose to BID.  Let pt know she could increase dose.  Pt voiced understanding.

## 2014-11-20 ENCOUNTER — Ambulatory Visit
Admission: RE | Admit: 2014-11-20 | Discharge: 2014-11-20 | Disposition: A | Discharge status: Home / Self Care | Payer: 59 | Source: Ambulatory Visit | Attending: Radiation Oncology | Admitting: Radiation Oncology

## 2014-11-20 DIAGNOSIS — Z510 Encounter for antineoplastic radiation therapy: Secondary | ICD-10-CM | POA: Diagnosis not present

## 2014-11-21 ENCOUNTER — Ambulatory Visit
Admission: RE | Admit: 2014-11-21 | Discharge: 2014-11-21 | Disposition: A | Discharge status: Home / Self Care | Payer: 59 | Source: Ambulatory Visit | Attending: Radiation Oncology | Admitting: Radiation Oncology

## 2014-11-21 DIAGNOSIS — Z510 Encounter for antineoplastic radiation therapy: Secondary | ICD-10-CM | POA: Diagnosis not present

## 2014-11-22 ENCOUNTER — Ambulatory Visit
Admission: RE | Admit: 2014-11-22 | Discharge: 2014-11-22 | Disposition: A | Discharge status: Home / Self Care | Payer: 59 | Source: Ambulatory Visit | Attending: Radiation Oncology | Admitting: Radiation Oncology

## 2014-11-22 DIAGNOSIS — Z510 Encounter for antineoplastic radiation therapy: Secondary | ICD-10-CM | POA: Diagnosis not present

## 2014-11-25 ENCOUNTER — Ambulatory Visit
Admission: RE | Admit: 2014-11-25 | Discharge: 2014-11-25 | Disposition: A | Discharge status: Home / Self Care | Payer: 59 | Source: Ambulatory Visit | Attending: Radiation Oncology | Admitting: Radiation Oncology

## 2014-11-25 ENCOUNTER — Ambulatory Visit: Payer: 59

## 2014-11-26 ENCOUNTER — Ambulatory Visit
Admission: RE | Admit: 2014-11-26 | Discharge: 2014-11-26 | Disposition: A | Discharge status: Home / Self Care | Payer: 59 | Source: Ambulatory Visit | Attending: Radiation Oncology | Admitting: Radiation Oncology

## 2014-11-26 ENCOUNTER — Encounter: Payer: Self-pay | Admitting: Radiation Oncology

## 2014-11-26 VITALS — BP 117/70 | HR 77 | Temp 98.5°F | Resp 16 | Ht 65.0 in | Wt 247.3 lb

## 2014-11-26 DIAGNOSIS — Z510 Encounter for antineoplastic radiation therapy: Secondary | ICD-10-CM | POA: Diagnosis not present

## 2014-11-26 DIAGNOSIS — C50311 Malignant neoplasm of lower-inner quadrant of right female breast: Secondary | ICD-10-CM

## 2014-11-26 NOTE — Progress Notes (Signed)
  Radiation Oncology         (336) [phone] ________________________________  Name: Krystal Kelley MRN: [id number]  Date: 11/26/2014  DOB: Oct 26, 1970  Weekly Radiation Therapy Management    ICD-9-CM ICD-10-CM   1. Breast cancer of lower-inner quadrant of right female breast 174.3 C50.311     Current Dose: 34.2 Gy     Planned Dose:  62.4 Gy  Narrative . . . . . . . . The patient presents for routine under treatment assessment.                                   The patient is without complaint Center for some itching within the breast and fatigue. She will try hydrocortisone cream for her itching.                                 Set-up films were reviewed.                                 The chart was checked. Physical Findings. . .  height is 5\' 5"  (1.651 m) and weight is 247 lb 4.8 oz (112.175 kg). Her oral temperature is 98.5 F (36.9 C). Her blood pressure is 117/70 and her pulse is 77. Her respiration is 16. [name] Hyperpigmentation changes noted in the right breast, no skin breakdown. Impression . . . . . . . The patient is tolerating radiation. Plan . . . . . . . . . . . . Continue treatment as planned.  ________________________________   Blair Promise, PhD, MD

## 2014-11-26 NOTE — Progress Notes (Signed)
Krystal Kelley has completed 19 fractions to her right breast.  She denies pain.  She reports fatigue.  The skin on her right breast has hyperpigmentation.  She is using radiaplex gel.  BP 117/70 mmHg  Pulse 77  Temp(Src) 98.5 F (36.9 C) (Oral)  Resp 16  Ht 5\' 5"  (1.651 m)  Wt 247 lb 4.8 oz (112.175 kg)  BMI 41.15 kg/m2

## 2014-11-27 ENCOUNTER — Ambulatory Visit
Admission: RE | Admit: 2014-11-27 | Discharge: 2014-11-27 | Disposition: A | Discharge status: Home / Self Care | Payer: 59 | Source: Ambulatory Visit | Attending: Radiation Oncology | Admitting: Radiation Oncology

## 2014-11-27 ENCOUNTER — Encounter: Payer: Self-pay | Admitting: Radiation Oncology

## 2014-11-27 DIAGNOSIS — Z510 Encounter for antineoplastic radiation therapy: Secondary | ICD-10-CM | POA: Diagnosis not present

## 2014-11-27 NOTE — Progress Notes (Signed)
  Radiation Oncology         [phone] ________________________________  Name: Krystal Kelley MRN: [id number]  Date: 11/27/2014  DOB: [birth year]  Electron beam Simulation  Note   Status: outpatient  NARRATIVE:  Today the patient underwent additional planning for radiation therapy directed at the right breast. The patient's treatment planning CT scan was reviewed and she had set up of a custom electron cutout field using 15 megavoltage electrons. The patient will be treated with an electron [employer] plan.  Plan the patient is to receive 6 additional treatments at 2 gray per fraction for a boost dose of 12 gray and a cumulative dose to the lumpectomy cavity of 62.4 gray   -----------------------------------  Blair Promise, PhD, MD

## 2014-11-28 ENCOUNTER — Ambulatory Visit
Admission: RE | Admit: 2014-11-28 | Discharge: 2014-11-28 | Disposition: A | Discharge status: Home / Self Care | Payer: 59 | Source: Ambulatory Visit | Attending: Radiation Oncology | Admitting: Radiation Oncology

## 2014-11-28 ENCOUNTER — Other Ambulatory Visit: Payer: Self-pay

## 2014-11-28 DIAGNOSIS — Z510 Encounter for antineoplastic radiation therapy: Secondary | ICD-10-CM | POA: Diagnosis not present

## 2014-11-28 DIAGNOSIS — C50311 Malignant neoplasm of lower-inner quadrant of right female breast: Secondary | ICD-10-CM

## 2014-11-28 MED ORDER — FUROSEMIDE 40 MG PO TABS: 40.0000 mg | ORAL_TABLET | Freq: Every day | ORAL | Status: DC | PRN

## 2014-11-29 ENCOUNTER — Ambulatory Visit
Admission: RE | Admit: 2014-11-29 | Discharge: 2014-11-29 | Disposition: A | Discharge status: Home / Self Care | Payer: 59 | Source: Ambulatory Visit | Attending: Radiation Oncology | Admitting: Radiation Oncology

## 2014-11-29 DIAGNOSIS — Z510 Encounter for antineoplastic radiation therapy: Secondary | ICD-10-CM | POA: Diagnosis not present

## 2014-12-02 ENCOUNTER — Ambulatory Visit
Admission: RE | Admit: 2014-12-02 | Discharge: 2014-12-02 | Disposition: A | Discharge status: Home / Self Care | Payer: 59 | Source: Ambulatory Visit | Attending: Radiation Oncology | Admitting: Radiation Oncology

## 2014-12-02 DIAGNOSIS — Z510 Encounter for antineoplastic radiation therapy: Secondary | ICD-10-CM | POA: Diagnosis not present

## 2014-12-03 ENCOUNTER — Ambulatory Visit
Admission: RE | Admit: 2014-12-03 | Discharge: 2014-12-03 | Disposition: A | Discharge status: Home / Self Care | Payer: 59 | Source: Ambulatory Visit | Attending: Radiation Oncology | Admitting: Radiation Oncology

## 2014-12-03 ENCOUNTER — Encounter: Payer: Self-pay | Admitting: Radiation Oncology

## 2014-12-03 ENCOUNTER — Ambulatory Visit: Admission: RE | Admit: 2014-12-03 | Payer: 59 | Source: Ambulatory Visit | Admitting: Radiation Oncology

## 2014-12-03 VITALS — BP 138/85 | HR 74 | Resp 16 | Wt 246.3 lb

## 2014-12-03 DIAGNOSIS — Z510 Encounter for antineoplastic radiation therapy: Secondary | ICD-10-CM | POA: Diagnosis not present

## 2014-12-03 DIAGNOSIS — C50311 Malignant neoplasm of lower-inner quadrant of right female breast: Secondary | ICD-10-CM | POA: Diagnosis present

## 2014-12-03 MED ORDER — BIAFINE EX EMUL
Freq: Every day | CUTANEOUS | Status: DC
  Administered 2014-12-03: 13:00:00 via TOPICAL

## 2014-12-03 MED ORDER — RADIAPLEXRX EX GEL
Freq: Once | CUTANEOUS | Status: AC
  Administered 2014-12-03: 13:00:00 via TOPICAL

## 2014-12-03 NOTE — Progress Notes (Signed)
  Radiation Oncology         [phone] ________________________________  Name: Krystal Kelley MRN: [id number]  Date: 12/03/2014  DOB: 1971/04/16  Weekly Radiation Therapy Management    ICD-9-CM ICD-10-CM   1. Breast cancer of lower-inner quadrant of right female breast 174.3 C50.311 hyaluronate sodium (RADIAPLEXRX) gel     topical emolient (BIAFINE) emulsion    Current Dose: 43.2 Gy     Planned Dose:  62.4 Gy  Narrative . . . . . . . . The patient presents for routine under treatment assessment.                                   The patient is without complaint. . Denies pain. . Denies fatigue. Some soreness in the nipple area.                                 Set-up films were reviewed.                                 The chart was checked. Physical Findings. . .  weight is 246 lb 4.8 oz (111.721 kg). Her blood pressure is 138/85 and her pulse is 74. Her respiration is 16. [name] Hyperpigmentation of right/treated breast. Dry desquamation noted right nipple and under right breast. The lungs are clear. The heart has a regular rhythm and rate. Impression . . . . . . . The patient is tolerating radiation. Plan . . . . . . . . . . . . Continue treatment as planned.  ________________________________   Blair Promise, PhD, MD

## 2014-12-03 NOTE — Progress Notes (Signed)
Weight and vitals stable. Denies pain. Hyperpigmentation of right/treated breast. Dry desquamation noted right nipple and under right breast. Denies fatigue.   BP 138/85 mmHg  Pulse 74  Resp 16  Wt 246 lb 4.8 oz (111.721 kg) Wt Readings from Last 3 Encounters:  12/03/14 246 lb 4.8 oz (111.721 kg)  11/26/14 247 lb 4.8 oz (112.175 kg)  11/19/14 250 lb 1.6 oz (113.445 kg)

## 2014-12-04 ENCOUNTER — Ambulatory Visit
Admission: RE | Admit: 2014-12-04 | Discharge: 2014-12-04 | Disposition: A | Discharge status: Home / Self Care | Payer: 59 | Source: Ambulatory Visit | Attending: Radiation Oncology | Admitting: Radiation Oncology

## 2014-12-04 ENCOUNTER — Telehealth: Payer: Self-pay | Admitting: *Deleted

## 2014-12-04 DIAGNOSIS — C50311 Malignant neoplasm of lower-inner quadrant of right female breast: Secondary | ICD-10-CM

## 2014-12-04 DIAGNOSIS — Z510 Encounter for antineoplastic radiation therapy: Secondary | ICD-10-CM | POA: Diagnosis not present

## 2014-12-04 NOTE — Progress Notes (Signed)
  Radiation Oncology         [phone] ________________________________  Name: Krystal Kelley MRN: [id number]  Date: 12/04/2014  DOB: [birth year]  Simulation Verification Note  electron field set up      ICD-9-CM ICD-10-CM   1. Breast cancer of lower-inner quadrant of right female breast 174.3 C50.311     Status: outpatient  NARRATIVE: The patient was brought to the treatment unit and placed in the planned treatment position. The clinical setup was verified. Then port films were obtained and uploaded to the radiation oncology medical record software.  The treatment beams were carefully compared against the planned radiation fields. The position location and shape of the radiation fields was reviewed. They targeted volume of tissue appears to be appropriately covered by the radiation beams. Organs at risk appear to be excluded as planned.  Based on my personal review, I approved the simulation verification. The patient's treatment will proceed as planned.  -----------------------------------  Blair Promise, PhD, MD

## 2014-12-04 NOTE — Telephone Encounter (Signed)
Krystal Kelley called after receiving automated appointment call.  "What is the appointment for on Friday 12-06-2014 at 10:30.  Informed her lab work is the reason for the appointment at 10:30 on 12-06-2014. from

## 2014-12-05 ENCOUNTER — Ambulatory Visit
Admission: RE | Admit: 2014-12-05 | Discharge: 2014-12-05 | Disposition: A | Discharge status: Home / Self Care | Payer: 59 | Source: Ambulatory Visit | Attending: Radiation Oncology | Admitting: Radiation Oncology

## 2014-12-05 ENCOUNTER — Ambulatory Visit (HOSPITAL_BASED_OUTPATIENT_CLINIC_OR_DEPARTMENT_OTHER): Payer: 59

## 2014-12-05 VITALS — BP 146/84 | HR 74 | Temp 97.0°F | Resp 20

## 2014-12-05 DIAGNOSIS — Z510 Encounter for antineoplastic radiation therapy: Secondary | ICD-10-CM | POA: Diagnosis not present

## 2014-12-05 DIAGNOSIS — Z5112 Encounter for antineoplastic immunotherapy: Secondary | ICD-10-CM

## 2014-12-05 DIAGNOSIS — C50311 Malignant neoplasm of lower-inner quadrant of right female breast: Secondary | ICD-10-CM

## 2014-12-05 LAB — CBC WITH DIFFERENTIAL/PLATELET
BASO%: 0.3 % (ref 0.0–2.0)
Basophils Absolute: 0 10*3/uL (ref 0.0–0.1)
EOS%: 1 % (ref 0.0–7.0)
Eosinophils Absolute: 0.1 10*3/uL (ref 0.0–0.5)
HCT: 38.1 % (ref 34.8–46.6)
HGB: 13 g/dL (ref 11.6–15.9)
LYMPH%: 19 % (ref 14.0–49.7)
MCH: 32 pg (ref 25.1–34.0)
MCHC: 34.1 g/dL (ref 31.5–36.0)
MCV: 93.8 fL (ref 79.5–101.0)
MONO#: 0.6 10e3/uL (ref 0.1–0.9)
MONO%: 9.2 % (ref 0.0–14.0)
NEUT#: 4.7 10e3/uL (ref 1.5–6.5)
NEUT%: 70.5 % (ref 38.4–76.8)
Platelets: 303 10*3/uL (ref 145–400)
RBC: 4.06 10*6/uL (ref 3.70–5.45)
RDW: 14.1 % (ref 11.2–14.5)
WBC: 6.7 10e3/uL (ref 3.9–10.3)
lymph#: 1.3 10e3/uL (ref 0.9–3.3)

## 2014-12-05 LAB — COMPREHENSIVE METABOLIC PANEL (CC13)
ALT: 17 U/L (ref 0–55)
AST: 19 U/L (ref 5–34)
Albumin: 3.6 g/dL (ref 3.5–5.0)
Alkaline Phosphatase: 66 U/L (ref 40–150)
Anion Gap: 11 mEq/L (ref 3–11)
BUN: 14.5 mg/dL (ref 7.0–26.0)
CO2: 24 mEq/L (ref 22–29)
Calcium: 9.3 mg/dL (ref 8.4–10.4)
Chloride: 105 meq/L (ref 98–109)
Creatinine: 1 mg/dL (ref 0.6–1.1)
EGFR: 79 mL/min/{1.73_m2} — ABNORMAL LOW (ref >90)
Glucose: 86 mg/dl (ref 70–140)
Potassium: 3.8 mEq/L (ref 3.5–5.1)
Sodium: 140 mEq/L (ref 136–145)
Total Bilirubin: 0.34 mg/dL (ref 0.20–1.20)
Total Protein: 7.5 g/dL (ref 6.4–8.3)

## 2014-12-05 MED ORDER — DIPHENHYDRAMINE HCL 25 MG PO CAPS
50.0000 mg | ORAL_CAPSULE | Freq: Once | ORAL | Status: AC
  Administered 2014-12-05: 50 mg via ORAL

## 2014-12-05 MED ORDER — TRASTUZUMAB CHEMO INJECTION 440 MG
6.0000 mg/kg | Freq: Once | INTRAVENOUS | Status: AC
  Administered 2014-12-05: 672 mg via INTRAVENOUS
  Filled 2014-12-05: qty 32

## 2014-12-05 MED ORDER — HEPARIN SOD (PORK) LOCK FLUSH 100 UNIT/ML IV SOLN
500.0000 [IU] | Freq: Once | INTRAVENOUS | Status: AC | PRN
Start: 2014-12-05 — End: 2014-12-05
  Administered 2014-12-05: 500 [IU]
  Filled 2014-12-05: qty 5

## 2014-12-05 MED ORDER — SODIUM CHLORIDE 0.9 % IV SOLN
Freq: Once | INTRAVENOUS | Status: AC
  Administered 2014-12-05: 13:00:00 via INTRAVENOUS

## 2014-12-05 MED ORDER — SODIUM CHLORIDE 0.9 % IJ SOLN
10.0000 mL | INTRAMUSCULAR | Status: DC | PRN
  Administered 2014-12-05: 10 mL
  Filled 2014-12-05: qty 10

## 2014-12-05 MED ORDER — LIDOCAINE-PRILOCAINE 2.5-2.5 % EX CREA: 1.0000 "application " | TOPICAL_CREAM | CUTANEOUS | Status: DC | PRN

## 2014-12-05 MED ORDER — ACETAMINOPHEN 325 MG PO TABS
650.0000 mg | ORAL_TABLET | Freq: Once | ORAL | Status: AC
  Administered 2014-12-05: 650 mg via ORAL

## 2014-12-05 MED ORDER — ACETAMINOPHEN 325 MG PO TABS
ORAL_TABLET | ORAL | Status: AC
  Filled 2014-12-05: qty 2

## 2014-12-05 MED ORDER — DIPHENHYDRAMINE HCL 25 MG PO CAPS
ORAL_CAPSULE | ORAL | Status: AC
  Filled 2014-12-05: qty 2

## 2014-12-05 MED ORDER — LIDOCAINE-PRILOCAINE 2.5-2.5 % EX CREA: TOPICAL_CREAM | CUTANEOUS | Status: DC | PRN

## 2014-12-05 NOTE — Patient Instructions (Signed)

## 2014-12-06 ENCOUNTER — Ambulatory Visit
Admission: RE | Admit: 2014-12-06 | Discharge: 2014-12-06 | Disposition: A | Discharge status: Home / Self Care | Payer: 59 | Source: Ambulatory Visit | Attending: Radiation Oncology | Admitting: Radiation Oncology

## 2014-12-06 ENCOUNTER — Other Ambulatory Visit: Payer: 59

## 2014-12-06 DIAGNOSIS — Z510 Encounter for antineoplastic radiation therapy: Secondary | ICD-10-CM | POA: Diagnosis not present

## 2014-12-08 ENCOUNTER — Ambulatory Visit: Payer: 59

## 2014-12-09 ENCOUNTER — Ambulatory Visit
Admission: RE | Admit: 2014-12-09 | Discharge: 2014-12-09 | Disposition: A | Discharge status: Home / Self Care | Payer: 59 | Source: Ambulatory Visit | Attending: Radiation Oncology | Admitting: Radiation Oncology

## 2014-12-09 ENCOUNTER — Ambulatory Visit: Payer: 59

## 2014-12-09 ENCOUNTER — Other Ambulatory Visit: Payer: Self-pay | Admitting: Hematology and Oncology

## 2014-12-09 ENCOUNTER — Encounter: Payer: Self-pay | Admitting: Podiatry

## 2014-12-09 ENCOUNTER — Ambulatory Visit (INDEPENDENT_AMBULATORY_CARE_PROVIDER_SITE_OTHER): Payer: 59 | Admitting: Podiatry

## 2014-12-09 VITALS — BP 138/87 | HR 89 | Resp 12

## 2014-12-09 DIAGNOSIS — G5761 Lesion of plantar nerve, right lower limb: Secondary | ICD-10-CM

## 2014-12-09 DIAGNOSIS — Z510 Encounter for antineoplastic radiation therapy: Secondary | ICD-10-CM | POA: Diagnosis not present

## 2014-12-09 DIAGNOSIS — G5781 Other specified mononeuropathies of right lower limb: Secondary | ICD-10-CM

## 2014-12-09 DIAGNOSIS — G5792 Unspecified mononeuropathy of left lower limb: Secondary | ICD-10-CM | POA: Diagnosis not present

## 2014-12-09 NOTE — Progress Notes (Signed)
She presents today for follow-up of the neuritis to the left Baxter's nerve. She states this seems to be doing much better but she would like to have another injection just to get rid of all of this pain.  Objective: 44 year old female presents with no acute distress mild tenderness on palpation of the medial calcaneal tubercle and a bundle branch beneath the left heel. Pulses are strongly palpable.  Assessment: Neuritis plantar left heel.  Plan: Chemical destruction of lesion with alcohol therapy. Follow up with her in 1 month.  Krystal Kelley DPM

## 2014-12-10 ENCOUNTER — Ambulatory Visit: Payer: 59

## 2014-12-10 ENCOUNTER — Encounter: Payer: Self-pay | Admitting: Radiation Oncology

## 2014-12-10 ENCOUNTER — Ambulatory Visit
Admission: RE | Admit: 2014-12-10 | Discharge: 2014-12-10 | Disposition: A | Discharge status: Home / Self Care | Payer: 59 | Source: Ambulatory Visit | Attending: Radiation Oncology | Admitting: Radiation Oncology

## 2014-12-10 VITALS — BP 137/83 | HR 86 | Resp 16 | Wt 244.7 lb

## 2014-12-10 DIAGNOSIS — C50311 Malignant neoplasm of lower-inner quadrant of right female breast: Secondary | ICD-10-CM

## 2014-12-10 DIAGNOSIS — Z510 Encounter for antineoplastic radiation therapy: Secondary | ICD-10-CM | POA: Diagnosis not present

## 2014-12-10 MED ORDER — BIAFINE EX EMUL
Freq: Every day | CUTANEOUS | Status: DC
  Administered 2014-12-10: 14:00:00 via TOPICAL

## 2014-12-10 MED ORDER — SILVER SULFADIAZINE 1 % EX CREA
TOPICAL_CREAM | Freq: Two times a day (BID) | CUTANEOUS | Status: DC
  Administered 2014-12-10: 14:00:00 via TOPICAL

## 2014-12-10 NOTE — Progress Notes (Signed)
  Radiation Oncology         [phone] ________________________________  Name: Krystal Kelley MRN: [id number]  Date: 12/10/2014  DOB: 08/21/1970  Weekly Radiation Therapy Management    ICD-9-CM ICD-10-CM   1. Breast cancer of lower-inner quadrant of right female breast 174.3 C50.311     Current Dose: 52.4 Gy     Planned Dose:  62.4 Gy  Narrative . . . . . . . . The patient presents for routine under treatment assessment.                                   The patient has discomfort in the inframammary fold area. She's been placing a Triple Antibiotic ointment to this area and Biafine elsewhere. She does report fatigue                                 Set-up films were reviewed.                                 The chart was checked. Physical Findings. . .  weight is 244 lb 11.2 oz (110.995 kg). Her blood pressure is 137/83 and her pulse is 86. Her respiration is 16. .  The lungs are clear. The heart has regular rhythm and rate. The right breast shows hyperpigmentation changes and erythema. The inframammary fold area shows moist desquamation. Impression . . . . . . . The patient is tolerating radiation. Plan . . . . . . . . . . . . Continue treatment as planned. The patient will start Silvadene along the inframammary fold area  ________________________________   Blair Promise, PhD, MD

## 2014-12-10 NOTE — Progress Notes (Signed)
Weight and vitals stable. Denies pain. Hyperpigmentation of right breast noted. Mild edema of right axilla noted. Moist desquamation right mammary fold noted. Reports using biafine and neosporin as directed. Reports fatigue.  BP 137/83 mmHg  Pulse 86  Resp 16  Wt 244 lb 11.2 oz (110.995 kg) Wt Readings from Last 3 Encounters:  12/10/14 244 lb 11.2 oz (110.995 kg)  12/03/14 246 lb 4.8 oz (111.721 kg)  11/26/14 247 lb 4.8 oz (112.175 kg)

## 2014-12-10 NOTE — Addendum Note (Signed)
Encounter addended by: Heywood Footman, RN on: 12/10/2014  1:55 PM<BR>     Documentation filed: [employer], Inpatient MAR, Orders

## 2014-12-11 ENCOUNTER — Ambulatory Visit
Admission: RE | Admit: 2014-12-11 | Discharge: 2014-12-11 | Disposition: A | Discharge status: Home / Self Care | Payer: 59 | Source: Ambulatory Visit | Attending: Radiation Oncology | Admitting: Radiation Oncology

## 2014-12-11 DIAGNOSIS — C50311 Malignant neoplasm of lower-inner quadrant of right female breast: Secondary | ICD-10-CM

## 2014-12-11 DIAGNOSIS — Z510 Encounter for antineoplastic radiation therapy: Secondary | ICD-10-CM | POA: Diagnosis not present

## 2014-12-11 MED ORDER — BIAFINE EX EMUL
Freq: Every day | CUTANEOUS | Status: DC
  Administered 2014-12-11: 14:00:00 via TOPICAL

## 2014-12-12 ENCOUNTER — Ambulatory Visit
Admission: RE | Admit: 2014-12-12 | Discharge: 2014-12-12 | Disposition: A | Discharge status: Home / Self Care | Payer: 59 | Source: Ambulatory Visit | Attending: Radiation Oncology | Admitting: Radiation Oncology

## 2014-12-12 ENCOUNTER — Telehealth: Payer: Self-pay

## 2014-12-12 DIAGNOSIS — Z510 Encounter for antineoplastic radiation therapy: Secondary | ICD-10-CM | POA: Diagnosis not present

## 2014-12-12 NOTE — Telephone Encounter (Signed)
Disability form mailed to [place].  Sent to scan.

## 2014-12-13 ENCOUNTER — Ambulatory Visit
Admission: RE | Admit: 2014-12-13 | Discharge: 2014-12-13 | Disposition: A | Discharge status: Home / Self Care | Payer: 59 | Source: Ambulatory Visit | Attending: Radiation Oncology | Admitting: Radiation Oncology

## 2014-12-13 DIAGNOSIS — Z510 Encounter for antineoplastic radiation therapy: Secondary | ICD-10-CM | POA: Diagnosis not present

## 2014-12-16 ENCOUNTER — Ambulatory Visit: Payer: 59

## 2014-12-16 ENCOUNTER — Ambulatory Visit
Admission: RE | Admit: 2014-12-16 | Discharge: 2014-12-16 | Disposition: A | Discharge status: Home / Self Care | Payer: 59 | Source: Ambulatory Visit | Attending: Radiation Oncology | Admitting: Radiation Oncology

## 2014-12-16 ENCOUNTER — Telehealth: Payer: Self-pay | Admitting: *Deleted

## 2014-12-16 DIAGNOSIS — Z510 Encounter for antineoplastic radiation therapy: Secondary | ICD-10-CM | POA: Diagnosis not present

## 2014-12-16 DIAGNOSIS — C50311 Malignant neoplasm of lower-inner quadrant of right female breast: Secondary | ICD-10-CM

## 2014-12-16 MED ORDER — FUROSEMIDE 40 MG PO TABS: 40.0000 mg | ORAL_TABLET | Freq: Every day | ORAL | Status: DC | PRN

## 2014-12-16 NOTE — Telephone Encounter (Signed)
VERBAL ORDER AND READ BACK TO DR,GUDENA- PT. MAY HAVE SIXTY TABLETS THIS REFILL. NOTIFIED PT.

## 2014-12-17 ENCOUNTER — Encounter: Payer: Self-pay | Admitting: Radiation Oncology

## 2014-12-17 ENCOUNTER — Ambulatory Visit
Admission: RE | Admit: 2014-12-17 | Discharge: 2014-12-17 | Disposition: A | Discharge status: Home / Self Care | Payer: 59 | Source: Ambulatory Visit | Attending: Radiation Oncology | Admitting: Radiation Oncology

## 2014-12-17 ENCOUNTER — Ambulatory Visit: Payer: 59

## 2014-12-17 VITALS — BP 128/87 | HR 75 | Temp 98.3°F | Ht 65.0 in | Wt 245.2 lb

## 2014-12-17 DIAGNOSIS — Z510 Encounter for antineoplastic radiation therapy: Secondary | ICD-10-CM | POA: Diagnosis not present

## 2014-12-17 DIAGNOSIS — C50311 Malignant neoplasm of lower-inner quadrant of right female breast: Secondary | ICD-10-CM | POA: Diagnosis not present

## 2014-12-17 MED ORDER — SILVER SULFADIAZINE 1 % EX CREA
TOPICAL_CREAM | Freq: Every day | CUTANEOUS | Status: DC
  Administered 2014-12-17: 14:00:00 via TOPICAL

## 2014-12-17 NOTE — Progress Notes (Addendum)
Krystal Kelley has completed treatment to her right breast.  She reports pain at a 5/10 in her right breast.  She reports fatigue.  The skin under her right arm is peeling.  She has hyperpigmentation on her right breast.  She has peeling under her right breast and she is using silvadene in this area.  She is requesting a refill on silvadene.    BP 128/87 mmHg  Pulse 75  Temp(Src) 98.3 F (36.8 C) (Oral)  Ht 5\' 5"  (1.651 m)  Wt 245 lb 3.2 oz (111.222 kg)  BMI 40.80 kg/m2

## 2014-12-17 NOTE — Progress Notes (Signed)
  Radiation Oncology         [phone] ________________________________  Name: Krystal Kelley MRN: [id number]  Date: 12/17/2014  DOB: 02-27-1971  Weekly Radiation Therapy Management    ICD-9-CM ICD-10-CM   1. Breast cancer of lower-inner quadrant of right female breast 174.3 C50.311 silver sulfADIAZINE (SILVADENE) 1 % cream    Current Dose: 62.4 Gy     Planned Dose:  62.4 Gy  Narrative . . . . . . . . The patient presents for routine under treatment assessment.                                   The patient is happy to complete her radiation therapy. she continues to have pain in the right breast and reports this is 5 out of 10. She continues to use Silvadene in the inframammary fold area.                                 Set-up films were reviewed.                                 The chart was checked. Physical Findings. . .  height is 5\' 5"  (1.651 m) and weight is 245 lb 3.2 oz (111.222 kg). Her oral temperature is 98.3 F (36.8 C). Her blood pressure is 128/87 and her pulse is 75. [name] Erythema and hyperpigmentation changes and dry desquamation. Minimal moist desquamation in the inframammary fold.  Impression . . . . . . . The patient is tolerating radiation. Plan . . . . . . . . . . . . Continue treatment as planned.  ________________________________   Blair Promise, PhD, MD

## 2014-12-20 ENCOUNTER — Telehealth: Payer: Self-pay | Admitting: *Deleted

## 2014-12-20 NOTE — Telephone Encounter (Signed)
Inez Catalina with [employer] [phone]) called requesting estimated return to work date for this patient.  Call forwarded to collaborative line and Managed [facility].

## 2014-12-24 ENCOUNTER — Ambulatory Visit: Payer: 59

## 2014-12-24 ENCOUNTER — Encounter: Payer: Self-pay | Admitting: Hematology and Oncology

## 2014-12-24 NOTE — Progress Notes (Signed)
I called and left message for Van Clines at [phone] to advise the patient is starting chemo again and we can write her out thru March 2017

## 2014-12-25 NOTE — Progress Notes (Signed)
  Radiation Oncology         [phone] ________________________________  Name: Krystal Kelley MRN: [id number]  Date: 12/17/2014  DOB: Dec 13, 1970  End of Treatment Note   ICD-9-CM ICD-10-CM    1. Breast cancer of lower-inner quadrant of right female breast 174.3 C50.311     DIAGNOSIS:     Breast cancer of lower-inner quadrant of right female breast       Stage T1c, N0 invasive ductal  Indication for treatment:  Breast conservation therapy       Radiation treatment dates:   10/30/2014-12/17/2014  Site/dose:   Right breast 50.4 gray in 28 fractions, lumpectomy cavity boost 12 gray in 6 fractions  Beams/energy:   3-D conformal using tangential beams directed at the right breast, lumpectomy cavity boost with electron custom cutout using electron monte Carlo planning  Narrative: The patient tolerated radiation treatment relatively well.   She did experience some fatigue as well as pain within the right breast. Moist desquamation in the inframammary fold treated with Silvadene  Plan: The patient has completed radiation treatment. The patient will return to radiation oncology clinic for routine followup in one month. I advised them to call or return sooner if they have any questions or concerns related to their recovery or treatment.  -----------------------------------  Blair Promise, PhD, MD

## 2014-12-26 ENCOUNTER — Telehealth: Payer: Self-pay | Admitting: Hematology and Oncology

## 2014-12-26 ENCOUNTER — Ambulatory Visit (HOSPITAL_BASED_OUTPATIENT_CLINIC_OR_DEPARTMENT_OTHER): Payer: 59 | Admitting: Hematology and Oncology

## 2014-12-26 ENCOUNTER — Encounter: Payer: Self-pay | Admitting: Hematology and Oncology

## 2014-12-26 ENCOUNTER — Other Ambulatory Visit (HOSPITAL_BASED_OUTPATIENT_CLINIC_OR_DEPARTMENT_OTHER): Payer: 59

## 2014-12-26 ENCOUNTER — Ambulatory Visit (HOSPITAL_BASED_OUTPATIENT_CLINIC_OR_DEPARTMENT_OTHER): Payer: 59

## 2014-12-26 VITALS — BP 133/71 | HR 95 | Temp 98.3°F | Resp 18 | Ht 65.0 in | Wt 243.6 lb

## 2014-12-26 DIAGNOSIS — Z5112 Encounter for antineoplastic immunotherapy: Secondary | ICD-10-CM | POA: Diagnosis not present

## 2014-12-26 DIAGNOSIS — C50311 Malignant neoplasm of lower-inner quadrant of right female breast: Secondary | ICD-10-CM | POA: Diagnosis not present

## 2014-12-26 DIAGNOSIS — R600 Localized edema: Secondary | ICD-10-CM | POA: Diagnosis not present

## 2014-12-26 LAB — COMPREHENSIVE METABOLIC PANEL (CC13)
ALT: 21 U/L (ref 0–55)
AST: 21 U/L (ref 5–34)
Albumin: 3.5 g/dL (ref 3.5–5.0)
Alkaline Phosphatase: 66 U/L (ref 40–150)
Anion Gap: 9 meq/L (ref 3–11)
BUN: 13.4 mg/dL (ref 7.0–26.0)
CO2: 28 meq/L (ref 22–29)
Calcium: 9.3 mg/dL (ref 8.4–10.4)
Chloride: 102 meq/L (ref 98–109)
Creatinine: 1.1 mg/dL (ref 0.6–1.1)
EGFR: 70 mL/min/{1.73_m2} — ABNORMAL LOW (ref >90)
Glucose: 77 mg/dL (ref 70–140)
Potassium: 3.6 mEq/L (ref 3.5–5.1)
Sodium: 140 meq/L (ref 136–145)
Total Bilirubin: 0.36 mg/dL (ref 0.20–1.20)
Total Protein: 7.7 g/dL (ref 6.4–8.3)

## 2014-12-26 LAB — CBC WITH DIFFERENTIAL/PLATELET
BASO%: 0.8 % (ref 0.0–2.0)
Basophils Absolute: 0.1 10*3/uL (ref 0.0–0.1)
EOS%: 1.9 % (ref 0.0–7.0)
Eosinophils Absolute: 0.1 10*3/uL (ref 0.0–0.5)
HCT: 38.5 % (ref 34.8–46.6)
HGB: 13 g/dL (ref 11.6–15.9)
LYMPH%: 19.5 % (ref 14.0–49.7)
MCH: 31.5 pg (ref 25.1–34.0)
MCHC: 33.9 g/dL (ref 31.5–36.0)
MCV: 92.9 fL (ref 79.5–101.0)
MONO#: 0.6 10*3/uL (ref 0.1–0.9)
MONO%: 9.7 % (ref 0.0–14.0)
NEUT#: 4.5 10e3/uL (ref 1.5–6.5)
NEUT%: 68.1 % (ref 38.4–76.8)
Platelets: 317 10e3/uL (ref 145–400)
RBC: 4.14 10e6/uL (ref 3.70–5.45)
RDW: 14.5 % (ref 11.2–14.5)
WBC: 6.6 10e3/uL (ref 3.9–10.3)
lymph#: 1.3 10*3/uL (ref 0.9–3.3)

## 2014-12-26 MED ORDER — ACETAMINOPHEN 325 MG PO TABS
650.0000 mg | ORAL_TABLET | Freq: Once | ORAL | Status: AC
  Administered 2014-12-26: 650 mg via ORAL

## 2014-12-26 MED ORDER — HEPARIN SOD (PORK) LOCK FLUSH 100 UNIT/ML IV SOLN
500.0000 [IU] | Freq: Once | INTRAVENOUS | Status: AC | PRN
  Administered 2014-12-26: 500 [IU]
  Filled 2014-12-26: qty 5

## 2014-12-26 MED ORDER — ACETAMINOPHEN 325 MG PO TABS
ORAL_TABLET | ORAL | Status: AC
  Filled 2014-12-26: qty 2

## 2014-12-26 MED ORDER — TRASTUZUMAB CHEMO INJECTION 440 MG
6.0000 mg/kg | Freq: Once | INTRAVENOUS | Status: AC
  Administered 2014-12-26: 672 mg via INTRAVENOUS
  Filled 2014-12-26: qty 32

## 2014-12-26 MED ORDER — DIPHENHYDRAMINE HCL 25 MG PO CAPS
50.0000 mg | ORAL_CAPSULE | Freq: Once | ORAL | Status: AC
  Administered 2014-12-26: 50 mg via ORAL

## 2014-12-26 MED ORDER — TAMOXIFEN CITRATE 20 MG PO TABS
20.0000 mg | ORAL_TABLET | Freq: Every day | ORAL | Status: DC
Start: 2014-12-26 — End: 2016-01-05

## 2014-12-26 MED ORDER — SODIUM CHLORIDE 0.9 % IJ SOLN
10.0000 mL | INTRAMUSCULAR | Status: DC | PRN
  Administered 2014-12-26: 10 mL
  Filled 2014-12-26: qty 10

## 2014-12-26 MED ORDER — DIPHENHYDRAMINE HCL 25 MG PO CAPS
ORAL_CAPSULE | ORAL | Status: AC
  Filled 2014-12-26: qty 2

## 2014-12-26 MED ORDER — SODIUM CHLORIDE 0.9 % IV SOLN
Freq: Once | INTRAVENOUS | Status: AC
  Administered 2014-12-26: 12:00:00 via INTRAVENOUS

## 2014-12-26 NOTE — Progress Notes (Signed)
Patient Care Team: Albina Billet, MD as PCP - General (Internal Medicine) Albina Billet, MD (Internal Medicine)  DIAGNOSIS: No matching staging information was found for the patient.  SUMMARY OF ONCOLOGIC HISTORY:   Breast cancer of lower-inner quadrant of right female breast   05/16/2014 Initial Diagnosis Ultrasound-guided biopsy: Invasive ductal carcinoma with DCIS, grade 1-2, ER/PR positive, Ki-67 43%, HER-2 positive   05/28/2014 Breast MRI Right breast: 1.4 x 1.2 x 1 cm oval enhancing mass lower inner quadrant   06/18/2014 Surgery Right breast lumpectomy: Invasive ductal carcinoma 1.3 cm negative for LVI; DCIS 1 mm from margins, 1 sentinel node negative, grade 3 ER 95%, PR 77%, HER-2 amplified ratio 8.25, Ki-67 43% T1 cN0 M0 stage IA   07/18/2014 - 10/03/2014 Chemotherapy Abraxane Herceptin weekly 12 followed by Herceptin maintenance every 3 weeks   11/01/2014 - 12/17/2014 Radiation Therapy Adjuvant radiation therapy    CHIEF COMPLIANT: follow-up after radiation therapy to start antiestrogen therapy  INTERVAL HISTORY: Krystal Kelley is a 44 year old with above-mentioned history of right breast cancer who is completed adjuvant radiation therapy and is here today to discuss antiestrogen therapy. She had done very well from radiation standpoint with very minimal side effects. She does not have any fatigue. She has been dealing with multiple issues related to her health insurance. She tells me that she finally got on a Obama care health plan. She is currently on maintenance Herceptin appears to be tolerating it very well.  REVIEW OF SYSTEMS:   Constitutional: Denies fevers, chills or abnormal weight loss Eyes: Denies blurriness of vision Ears, nose, mouth, throat, and face: Denies mucositis or sore throat Respiratory: Denies cough, dyspnea or wheezes Cardiovascular: Denies palpitation, chest discomfort or lower extremity swelling Gastrointestinal:  Denies nausea, heartburn or change in bowel  habits Skin: Denies abnormal skin rashes Lymphatics: Denies new lymphadenopathy or easy bruising Neurological:Denies numbness, tingling or new weaknesses Behavioral/Psych: Mood is stable, no new changes  All other systems were reviewed with the patient and are negative.  I have reviewed the past medical history, past surgical history, social history and family history with the patient and they are unchanged from previous note.  ALLERGIES:  has No Known Allergies.  MEDICATIONS:  Current Outpatient Prescriptions  Medication Sig Dispense Refill  . chlorpheniramine-HYDROcodone (TUSSIONEX) 10-8 MG/5ML LQCR Take 5 mLs by mouth every 12 (twelve) hours as needed for cough.   0  . emollient (BIAFINE) cream Apply topically as needed.    . fluticasone (FLONASE) 50 MCG/ACT nasal spray SPRAY 2 SPRAYS IN EACH NOSTRILS  2  . furosemide (LASIX) 40 MG tablet Take 1 tablet (40 mg total) by mouth daily as needed. 60 tablet 0  . hyaluronate sodium (RADIAPLEXRX) GEL Apply 1 application topically 2 (two) times daily.    . hydrochlorothiazide (HYDRODIURIL) 25 MG tablet Take 25 mg by mouth daily.    [name] lidocaine-prilocaine (EMLA) cream Apply 1 application topically as needed. 30 g 0  . lidocaine-prilocaine (EMLA) cream Apply 1 application topically as needed. 30 g 2  . loperamide (IMODIUM) 2 MG capsule Take 2 mg by mouth as needed for diarrhea or loose stools.    . non-metallic deodorant Jethro Poling) MISC Apply 1 application topically daily as needed.    . ondansetron (ZOFRAN) 8 MG tablet TAKE 1 TABLET BY MOUTH TWICE A DAY . START THE DAY AFTER CHEMO FOR 2 DAYS, THEN USE AS NEEDED  1  . prochlorperazine (COMPAZINE) 10 MG tablet Take 1 tablet (10 mg total) by  mouth every 6 (six) hours as needed (Nausea or vomiting). (Patient not taking: Reported on 12/17/2014) 30 tablet 1  . RESTASIS 0.05 % ophthalmic emulsion INSTILL ONE DROP IN AFFECTED EYE 2 TIMES A DAY  4  . silver sulfADIAZINE (SILVADENE) 1 % cream Apply 1  application topically 2 (two) times daily.    . valACYclovir (VALTREX) 500 MG tablet      No current facility-administered medications for this visit.    PHYSICAL EXAMINATION: ECOG PERFORMANCE STATUS: 1 - Symptomatic but completely ambulatory  Filed Vitals:   12/26/14 1045  BP: 133/71  Pulse: 95  Temp: 98.3 F (36.8 C)  Resp: 18   Filed Weights   12/26/14 1045  Weight: 243 lb 9.6 oz (110.496 kg)    GENERAL:alert, no distress and comfortable SKIN: skin color, texture, turgor are normal, no rashes or significant lesions EYES: normal, Conjunctiva are pink and non-injected, sclera clear OROPHARYNX:no exudate, no erythema and lips, buccal mucosa, and tongue normal  NECK: supple, thyroid normal size, non-tender, without nodularity LYMPH:  no palpable lymphadenopathy in the cervical, axillary or inguinal LUNGS: clear to auscultation and percussion with normal breathing effort HEART: regular rate & rhythm and no murmurs and no lower extremity edema ABDOMEN:abdomen soft, non-tender and normal bowel sounds Musculoskeletal:no cyanosis of digits and no clubbing  NEURO: alert & oriented x 3 with fluent speech, no focal motor/sensory deficits  LABORATORY DATA:  I have reviewed the data as listed   Chemistry      Component Value Date/Time   NA 140 12/05/2014 1233   NA 140 08/01/2014 0918   NA 135* 06/11/2013 0408   K 3.8 12/05/2014 1233   K 4.0 08/01/2014 0918   K 3.2* 06/11/2013 0408   CL 107 08/01/2014 0918   CL 102 06/11/2013 0408   CO2 24 12/05/2014 1233   CO2 28 08/01/2014 0918   CO2 26 06/11/2013 0408   BUN 14.5 12/05/2014 1233   BUN 12 08/01/2014 0918   BUN 15 06/11/2013 0408   CREATININE 1.0 12/05/2014 1233   CREATININE 0.93 08/01/2014 0918   CREATININE 1.16 06/11/2013 0408      Component Value Date/Time   CALCIUM 9.3 12/05/2014 1233   CALCIUM 8.8 08/01/2014 0918   CALCIUM 8.7 06/11/2013 0408   ALKPHOS 66 12/05/2014 1233   ALKPHOS 54 08/01/2014 0918   ALKPHOS  75 06/12/2013 1541   AST 19 12/05/2014 1233   AST 41* 08/01/2014 0918   AST 137* 06/12/2013 1541   ALT 17 12/05/2014 1233   ALT 54* 08/01/2014 0918   ALT 192* 06/12/2013 1541   BILITOT 0.34 12/05/2014 1233   BILITOT 0.2* 08/01/2014 0918   BILITOT 0.5 06/12/2013 1541       Lab Results  Component Value Date   WBC 6.6 12/26/2014   HGB 13.0 12/26/2014   HCT 38.5 12/26/2014   MCV 92.9 12/26/2014   PLT 317 12/26/2014   NEUTROABS 4.5 12/26/2014   ASSESSMENT & PLAN:  Breast cancer of lower-inner quadrant of right female breast Right breast lumpectomy 06/18/2014: Invasive ductal carcinoma 1.3 cm negative for LVI; DCIS 1 mm from margins, 1 sentinel node negative, grade 3 ER 95%, PR 77%, HER-2 amplified ratio 8.25, Ki-67 43% T1 cN0 M0 stage IA  Treatment summary S/P Adjuvant chemo with Abraxane Herceptin started 07/18/2014 completed 10/03/14 Status post adjuvant radiation completed 12/17/2014  Current treatment: 1. Continue maintenance Herceptin every 3 weeks until March 2017 2. Start antiestrogen therapy with tamoxifen 20 mg daily  Tamoxifen  counseling: We discussed the risks and benefits of tamoxifen. These include but not limited to insomnia, hot flashes, mood changes, vaginal dryness, and weight gain. Although rare, serious side effects including endometrial cancer, risk of blood clots were also discussed. We strongly believe that the benefits far outweigh the risks. Patient understands these risks and consented to starting treatment. Planned treatment duration is 5 years.  Leg edema: Treated with Lasix  Return to clinic every 6 weeks for follow-up     No orders of the defined types were placed in this encounter.   The patient has a good understanding of the overall plan. she agrees with it. she will call with any problems that may develop before the next visit here.   Rulon Eisenmenger, MD

## 2014-12-26 NOTE — Telephone Encounter (Signed)
Appointments made and avs printed for patient °

## 2014-12-26 NOTE — Assessment & Plan Note (Signed)
Right breast lumpectomy 06/18/2014: Invasive ductal carcinoma 1.3 cm negative for LVI; DCIS 1 mm from margins, 1 sentinel node negative, grade 3 ER 95%, PR 77%, HER-2 amplified ratio 8.25, Ki-67 43% T1 cN0 M0 stage IA  Treatment summary S/P Adjuvant chemo with Abraxane Herceptin started 07/18/2014 completed 10/03/14 Status post adjuvant radiation completed 12/17/2014  Current treatment: 1. Continue maintenance Herceptin every 3 weeks until March 2017 2. Start antiestrogen therapy with tamoxifen 20 mg daily  Tamoxifen counseling: We discussed the risks and benefits of tamoxifen. These include but not limited to insomnia, hot flashes, mood changes, vaginal dryness, and weight gain. Although rare, serious side effects including endometrial cancer, risk of blood clots were also discussed. We strongly believe that the benefits far outweigh the risks. Patient understands these risks and consented to starting treatment. Planned treatment duration is 5 years.  Leg edema: Treated with Lasix  Return to clinic every 6 weeks for follow-up

## 2014-12-26 NOTE — Patient Instructions (Signed)
[facility] Discharge Instructions for Patients Receiving Chemotherapy  Today you received the following chemotherapy agents:  Herceptin  To help prevent nausea and vomiting after your treatment, we encourage you to take your nausea medication as prescribed.   If you develop nausea and vomiting that is not controlled by your nausea medication, call the clinic.   BELOW ARE SYMPTOMS THAT SHOULD BE REPORTED IMMEDIATELY:  *FEVER GREATER THAN 100.5 F  *CHILLS WITH OR WITHOUT FEVER  NAUSEA AND VOMITING THAT IS NOT CONTROLLED WITH YOUR NAUSEA MEDICATION  *UNUSUAL SHORTNESS OF BREATH  *UNUSUAL BRUISING OR BLEEDING  TENDERNESS IN MOUTH AND THROAT WITH OR WITHOUT PRESENCE OF ULCERS  *URINARY PROBLEMS  *BOWEL PROBLEMS  UNUSUAL RASH Items with * indicate a potential emergency and should be followed up as soon as possible.  Feel free to call the clinic you have any questions or concerns. The clinic phone number is [phone].  Please show the CHEMO ALERT CARD at check-in to the Emergency Department and triage nurse.   

## 2014-12-27 ENCOUNTER — Other Ambulatory Visit: Payer: Self-pay

## 2014-12-27 DIAGNOSIS — C50311 Malignant neoplasm of lower-inner quadrant of right female breast: Secondary | ICD-10-CM

## 2014-12-30 ENCOUNTER — Telehealth: Payer: Self-pay | Admitting: *Deleted

## 2014-12-30 ENCOUNTER — Ambulatory Visit: Payer: Self-pay | Admitting: Podiatry

## 2014-12-30 NOTE — Telephone Encounter (Signed)
PT. TOOK MEDICATION FOR CRAMPS WAS THIS OK?

## 2014-12-30 NOTE — Telephone Encounter (Signed)
She is fine to take OTC meds for cramps.  Avoid anything with hormones in it.

## 2014-12-30 NOTE — Telephone Encounter (Signed)
THE MEDICATION PT. TOOK FOR CRAMPS WAS PRESCRIBED BY HER GYN. SHE WILL CHECK WITH HER GYN AND BE SURE THERE ARE NO HORMONES IN THE PRESCRIBED MEDICATION.

## 2015-01-01 ENCOUNTER — Telehealth: Payer: Self-pay | Admitting: Hematology and Oncology

## 2015-01-01 NOTE — Telephone Encounter (Signed)
Called and left a message with October echo and md visit at [facility]

## 2015-01-06 ENCOUNTER — Ambulatory Visit (INDEPENDENT_AMBULATORY_CARE_PROVIDER_SITE_OTHER): Payer: 59 | Admitting: Podiatry

## 2015-01-06 ENCOUNTER — Encounter: Payer: Self-pay | Admitting: Podiatry

## 2015-01-06 ENCOUNTER — Encounter: Payer: Self-pay | Admitting: Hematology and Oncology

## 2015-01-06 VITALS — BP 161/108 | HR 68 | Resp 16

## 2015-01-06 DIAGNOSIS — G5792 Unspecified mononeuropathy of left lower limb: Secondary | ICD-10-CM

## 2015-01-06 DIAGNOSIS — G5762 Lesion of plantar nerve, left lower limb: Secondary | ICD-10-CM

## 2015-01-06 NOTE — Progress Notes (Signed)
I placed [employer] form on desk of nurse of dr. Lindi Adie.

## 2015-01-06 NOTE — Progress Notes (Signed)
She presents today for follow-up of neuritis to her left foot. She states that she starting to do much better. She states it really doesn't hurt and I'm not sure that I need an injection today.  Objective: Vital signs are stable she is alert and oriented 3. Pulses are palpable. She has no pain on palpation left heel. Tailor's bunion deformity present right foot.  Assessment: Capsulitis fifth metatarsophalangeal joint right well-healing neuritis left calcaneus.  Plan: Follow up with me in 3 weeks to consider another injection.

## 2015-01-13 ENCOUNTER — Encounter: Payer: Self-pay | Admitting: Hematology and Oncology

## 2015-01-13 ENCOUNTER — Encounter: Payer: Self-pay | Admitting: *Deleted

## 2015-01-13 NOTE — Progress Notes (Signed)
Nurse faxed the [employer]  [phone]

## 2015-01-13 NOTE — Progress Notes (Signed)
Provider statement for disability benefits faxed, sent to scan.

## 2015-01-15 ENCOUNTER — Encounter: Payer: Self-pay | Admitting: Oncology

## 2015-01-16 ENCOUNTER — Other Ambulatory Visit: Payer: Self-pay | Admitting: Lab

## 2015-01-16 ENCOUNTER — Ambulatory Visit
Admission: RE | Admit: 2015-01-16 | Discharge: 2015-01-16 | Disposition: A | Discharge status: Home / Self Care | Payer: 59 | Source: Ambulatory Visit | Attending: Radiation Oncology | Admitting: Radiation Oncology

## 2015-01-16 ENCOUNTER — Ambulatory Visit (HOSPITAL_BASED_OUTPATIENT_CLINIC_OR_DEPARTMENT_OTHER): Payer: 59

## 2015-01-16 ENCOUNTER — Other Ambulatory Visit (HOSPITAL_BASED_OUTPATIENT_CLINIC_OR_DEPARTMENT_OTHER): Payer: 59

## 2015-01-16 ENCOUNTER — Encounter: Payer: Self-pay | Admitting: Radiation Oncology

## 2015-01-16 VITALS — BP 150/90 | HR 77 | Temp 98.3°F | Resp 20

## 2015-01-16 VITALS — BP 139/81 | HR 78 | Temp 98.6°F | Ht 65.0 in | Wt 243.6 lb

## 2015-01-16 DIAGNOSIS — C50311 Malignant neoplasm of lower-inner quadrant of right female breast: Secondary | ICD-10-CM

## 2015-01-16 DIAGNOSIS — Z5112 Encounter for antineoplastic immunotherapy: Secondary | ICD-10-CM

## 2015-01-16 LAB — CBC WITH DIFFERENTIAL/PLATELET
BASO%: 0.5 % (ref 0.0–2.0)
Basophils Absolute: 0 10e3/uL (ref 0.0–0.1)
EOS%: 1.5 % (ref 0.0–7.0)
Eosinophils Absolute: 0.1 10e3/uL (ref 0.0–0.5)
HCT: 36.6 % (ref 34.8–46.6)
HGB: 12.5 g/dL (ref 11.6–15.9)
LYMPH%: 22 % (ref 14.0–49.7)
MCH: 31.4 pg (ref 25.1–34.0)
MCHC: 34.1 g/dL (ref 31.5–36.0)
MCV: 92.2 fL (ref 79.5–101.0)
MONO#: 0.6 10*3/uL (ref 0.1–0.9)
MONO%: 8.8 % (ref 0.0–14.0)
NEUT#: 4.6 10*3/uL (ref 1.5–6.5)
NEUT%: 67.2 % (ref 38.4–76.8)
Platelets: 332 10e3/uL (ref 145–400)
RBC: 3.97 10*6/uL (ref 3.70–5.45)
RDW: 14.2 % (ref 11.2–14.5)
WBC: 6.9 10*3/uL (ref 3.9–10.3)
lymph#: 1.5 10e3/uL (ref 0.9–3.3)

## 2015-01-16 LAB — COMPREHENSIVE METABOLIC PANEL (CC13)
ALT: 36 U/L (ref 0–55)
AST: 30 U/L (ref 5–34)
Albumin: 3.6 g/dL (ref 3.5–5.0)
Alkaline Phosphatase: 75 U/L (ref 40–150)
Anion Gap: 7 meq/L (ref 3–11)
BUN: 12 mg/dL (ref 7.0–26.0)
CO2: 30 mEq/L — ABNORMAL HIGH (ref 22–29)
Calcium: 9.1 mg/dL (ref 8.4–10.4)
Chloride: 104 meq/L (ref 98–109)
Creatinine: 1.1 mg/dL (ref 0.6–1.1)
EGFR: 73 ml/min/1.73 m2 — ABNORMAL LOW (ref >90)
Glucose: 94 mg/dl (ref 70–140)
Potassium: 3.1 meq/L — ABNORMAL LOW (ref 3.5–5.1)
Sodium: 142 mEq/L (ref 136–145)
Total Bilirubin: 0.35 mg/dL (ref 0.20–1.20)
Total Protein: 7.5 g/dL (ref 6.4–8.3)

## 2015-01-16 MED ORDER — DIPHENHYDRAMINE HCL 25 MG PO CAPS
50.0000 mg | ORAL_CAPSULE | Freq: Once | ORAL | Status: AC
  Administered 2015-01-16: 50 mg via ORAL

## 2015-01-16 MED ORDER — HEPARIN SOD (PORK) LOCK FLUSH 100 UNIT/ML IV SOLN
500.0000 [IU] | Freq: Once | INTRAVENOUS | Status: AC | PRN
  Administered 2015-01-16: 500 [IU]
  Filled 2015-01-16: qty 5

## 2015-01-16 MED ORDER — ACETAMINOPHEN 325 MG PO TABS
ORAL_TABLET | ORAL | Status: AC
Start: 2015-01-16 — End: 2015-01-16
  Filled 2015-01-16: qty 2

## 2015-01-16 MED ORDER — DIPHENHYDRAMINE HCL 25 MG PO CAPS
ORAL_CAPSULE | ORAL | Status: AC
  Filled 2015-01-16: qty 2

## 2015-01-16 MED ORDER — SODIUM CHLORIDE 0.9 % IJ SOLN
10.0000 mL | INTRAMUSCULAR | Status: DC | PRN
  Administered 2015-01-16: 10 mL
  Filled 2015-01-16: qty 10

## 2015-01-16 MED ORDER — ACETAMINOPHEN 325 MG PO TABS
650.0000 mg | ORAL_TABLET | Freq: Once | ORAL | Status: AC
  Administered 2015-01-16: 650 mg via ORAL

## 2015-01-16 MED ORDER — SODIUM CHLORIDE 0.9 % IV SOLN
Freq: Once | INTRAVENOUS | Status: AC
  Administered 2015-01-16: 13:00:00 via INTRAVENOUS

## 2015-01-16 MED ORDER — TRASTUZUMAB CHEMO INJECTION 440 MG
6.0000 mg/kg | Freq: Once | INTRAVENOUS | Status: AC
  Administered 2015-01-16: 672 mg via INTRAVENOUS
  Filled 2015-01-16: qty 32

## 2015-01-16 NOTE — Progress Notes (Signed)
Krystal Kelley here for follow up.  She denies pain.  She will start tamoxifen on 01/18/15.  She is also getting herceptin q 3 weeks.  She reports that her menses have returned and she said she is having vaginal itching and yellow discharge.  She denies fatigue.  The skin on her right breast is intact with hyperpigmentation.  BP 139/81 mmHg  Pulse 78  Temp(Src) 98.6 F (37 C) (Oral)  Ht 5\' 5"  (1.651 m)  Wt 243 lb 9.6 oz (110.496 kg)  BMI 40.54 kg/m2

## 2015-01-16 NOTE — Progress Notes (Signed)
Radiation Oncology         [phone] ________________________________  Name: Krystal Kelley MRN: [id number]  Date: 01/16/2015  DOB: May 06, 1970  Follow-Up Visit Note  CC: Albina Billet, MD  Excell Seltzer, MD  No diagnosis found.  Diagnosis:  Right Breast Cancer  Interval Since Last Radiation:  1  months. 10/30/2014-12/17/2014 Site/dose:   Right breast 50.4 gray in 28 fractions, lumpectomy cavity boost 12 gray in 6 fractions  Narrative:  The patient returns today for routine follow-up. She denies pain, but feels occasional "twitching." She will start tamoxifen on 01/18/15. She is also getting herceptin q 3 weeks. She reports that her menses has returned and she said she is having vaginal itching and yellow discharge. She also reports pruritus under the right breast. She reports some fatigue.  ALLERGIES:  has No Known Allergies.  Meds: Current Outpatient Prescriptions  Medication Sig Dispense Refill  . Eluxadoline (VIBERZI) 100 MG TABS Take by mouth daily as needed.    . furosemide (LASIX) 40 MG tablet Take 1 tablet (40 mg total) by mouth daily as needed. 60 tablet 0  . hydrochlorothiazide (HYDRODIURIL) 25 MG tablet Take 25 mg by mouth daily.    [name] lidocaine-prilocaine (EMLA) cream Apply 1 application topically as needed. 30 g 0  . lidocaine-prilocaine (EMLA) cream Apply 1 application topically as needed. 30 g 2  . loperamide (IMODIUM) 2 MG capsule Take 2 mg by mouth as needed for diarrhea or loose stools.    . RESTASIS 0.05 % ophthalmic emulsion INSTILL ONE DROP IN AFFECTED EYE 2 TIMES A DAY  4  . valACYclovir (VALTREX) 500 MG tablet     . chlorpheniramine-HYDROcodone (TUSSIONEX) 10-8 MG/5ML LQCR Take 5 mLs by mouth every 12 (twelve) hours as needed for cough.   0  . emollient (BIAFINE) cream Apply topically as needed.    . fluticasone (FLONASE) 50 MCG/ACT nasal spray SPRAY 2 SPRAYS IN EACH NOSTRILS  2  . hyaluronate sodium (RADIAPLEXRX) GEL Apply 1 application topically 2  (two) times daily.    . non-metallic deodorant Jethro Poling) MISC Apply 1 application topically daily as needed.    . ondansetron (ZOFRAN) 8 MG tablet TAKE 1 TABLET BY MOUTH TWICE A DAY . START THE DAY AFTER CHEMO FOR 2 DAYS, THEN USE AS NEEDED  1  . prochlorperazine (COMPAZINE) 10 MG tablet Take 1 tablet (10 mg total) by mouth every 6 (six) hours as needed (Nausea or vomiting). (Patient not taking: Reported on 01/16/2015) 30 tablet 1  . silver sulfADIAZINE (SILVADENE) 1 % cream Apply 1 application topically 2 (two) times daily.    . tamoxifen (NOLVADEX) 20 MG tablet Take 1 tablet (20 mg total) by mouth daily. (Patient not taking: Reported on 01/16/2015) 90 tablet 3   No current facility-administered medications for this encounter.    Physical Findings: The patient is in no acute distress. Patient is alert and oriented.  height is 5\' 5"  (1.651 m) and weight is 243 lb 9.6 oz (110.496 kg). Her oral temperature is 98.6 F (37 C). Her blood pressure is 139/81 and her pulse is 78. .  No significant changes. Lungs are clear to auscultation bilaterally. Heart has regular rate and rhythm. No palpable cervical, supraclavicular, or axillary adenopathy. Right breast shows some hyperpigmentation changes. Mild edema of the right breast. No palpable masses appreciated.  Lab Findings: Lab Results  Component Value Date   WBC 6.6 12/26/2014   HGB 13.0 12/26/2014   HCT 38.5 12/26/2014   MCV 92.9  12/26/2014   PLT 317 12/26/2014    Radiographic Findings: No results found.  Impression:  The patient is recovering from the effects of radiation.  Plan: She will be followed by rad/onc on a prn basis.  she will be followed by med/onc and continue herceptin. She will start tamoxifen on 01/18/15.  This document serves as a record of services personally performed by Gery Pray, MD. It was created on his behalf by Darcus Austin, a trained medical scribe. The creation of this record is based on the scribe's personal  observations and the provider's statements to them. This document has been checked and approved by the attending provider.  -----------------------------------  Blair Promise, PhD, MD

## 2015-01-16 NOTE — Patient Instructions (Signed)
[facility] Discharge Instructions for Patients Receiving Chemotherapy  Today you received the following chemotherapy agents:  Herceptin  To help prevent nausea and vomiting after your treatment, we encourage you to take your nausea medication as prescribed.   If you develop nausea and vomiting that is not controlled by your nausea medication, call the clinic.   BELOW ARE SYMPTOMS THAT SHOULD BE REPORTED IMMEDIATELY:  *FEVER GREATER THAN 100.5 F  *CHILLS WITH OR WITHOUT FEVER  NAUSEA AND VOMITING THAT IS NOT CONTROLLED WITH YOUR NAUSEA MEDICATION  *UNUSUAL SHORTNESS OF BREATH  *UNUSUAL BRUISING OR BLEEDING  TENDERNESS IN MOUTH AND THROAT WITH OR WITHOUT PRESENCE OF ULCERS  *URINARY PROBLEMS  *BOWEL PROBLEMS  UNUSUAL RASH Items with * indicate a potential emergency and should be followed up as soon as possible.  Feel free to call the clinic you have any questions or concerns. The clinic phone number is [phone].  Please show the CHEMO ALERT CARD at check-in to the Emergency Department and triage nurse.   

## 2015-01-20 ENCOUNTER — Telehealth: Payer: Self-pay

## 2015-01-20 NOTE — Telephone Encounter (Signed)
Returned called to [facility] [phone] [id number] - confirmed patient on continuous leave until June 23, 2015.

## 2015-01-21 ENCOUNTER — Encounter: Payer: Self-pay | Admitting: Hematology and Oncology

## 2015-01-21 NOTE — Progress Notes (Signed)
I called and left Thayer Headings a message about continuation of leave for patient thru 06/23/15. She had left a message to confirm the date

## 2015-01-27 ENCOUNTER — Ambulatory Visit (INDEPENDENT_AMBULATORY_CARE_PROVIDER_SITE_OTHER): Payer: 59 | Admitting: Podiatry

## 2015-01-27 ENCOUNTER — Encounter: Payer: Self-pay | Admitting: Podiatry

## 2015-01-27 VITALS — BP 141/96 | HR 84 | Resp 12

## 2015-01-27 DIAGNOSIS — M722 Plantar fascial fibromatosis: Secondary | ICD-10-CM | POA: Diagnosis not present

## 2015-01-27 DIAGNOSIS — G5792 Unspecified mononeuropathy of left lower limb: Secondary | ICD-10-CM | POA: Diagnosis not present

## 2015-01-27 NOTE — Progress Notes (Signed)
She presents today for follow-up of her plantar fasciitis and neuritis to her left heel. She states that she seems to be doing much better and she has had no pain since her last visit.  Objective: Vital signs are stable she is alert and oriented 3. Pulses are strongly palpable. No reproducible pain on palpation to the left heel.  Assessment: Tailor's bunion deformity right. Resolving neuritis left foot.  Plan: Follow up with me in 1 month for reevaluation of his left heel.  Roselind Messier DPM

## 2015-01-29 ENCOUNTER — Ambulatory Visit (HOSPITAL_COMMUNITY)
Admission: RE | Admit: 2015-01-29 | Discharge: 2015-01-29 | Disposition: A | Discharge status: Home / Self Care | Payer: 59 | Source: Ambulatory Visit | Attending: Cardiology | Admitting: Cardiology

## 2015-01-29 ENCOUNTER — Ambulatory Visit (HOSPITAL_BASED_OUTPATIENT_CLINIC_OR_DEPARTMENT_OTHER)
Admission: RE | Admit: 2015-01-29 | Discharge: 2015-01-29 | Disposition: A | Discharge status: Home / Self Care | Payer: 59 | Source: Ambulatory Visit | Attending: Cardiology | Admitting: Cardiology

## 2015-01-29 VITALS — BP 128/86 | HR 80 | Wt 242.0 lb

## 2015-01-29 DIAGNOSIS — C50311 Malignant neoplasm of lower-inner quadrant of right female breast: Secondary | ICD-10-CM | POA: Insufficient documentation

## 2015-01-29 DIAGNOSIS — I340 Nonrheumatic mitral (valve) insufficiency: Secondary | ICD-10-CM | POA: Diagnosis not present

## 2015-01-29 NOTE — Progress Notes (Signed)
  Echocardiogram 2D Echocardiogram has been performed.  Donata Clay 01/29/2015, 11:58 AM

## 2015-01-29 NOTE — Patient Instructions (Signed)
Echo in 3 months with follow up

## 2015-01-30 NOTE — Progress Notes (Signed)
Patient ID: Krystal Kelley, female   DOB: Feb 06, 1971, 44 y.o.   MRN: [id number] Oncologist: Dr. Lindi Adie  44 y.o. with history of breath cancer presents for cardio-oncology evaluation.  She is s/p lumpectomy in 3/16.  ER+/PR+/HER2+.  She had chemotherapy including Herceptin to 6/16.  She completed radiation in 8/16.  She will continue Herceptin to complete a year in 3/17.  Now on tamoxifen.  She has had 3 echocardiograms now, I reviewed the third today.  EF and strain has been stable.  She is feeling good overall, no exertional dyspnea or chest pain.  She tries to work out several times a week.  Occasional lower extremity edema but none currently.   Labs (9/16): K 3.1, creatinine 1.1  PMH: 1. Breath cancer: S/p lumpectomy in 3/16.  ER+/PR+/HER2+.  She had chemotherapy including Herceptin to 6/16.  She completed radiation in 8/16.  She will continue Herceptin to complete a year in 3/17.  Now on tamoxifen.  - Echo (2/16) with EF 55-60%, GLS -19.7%. - Echo (6/16) with EF 60-65%, GLS -18.4%. - Echo (10/16) with EF 94-76%, normal diastolic function, normal RV size and systolic function, lateral s' 7.8, GLS -23%.  2. Anxiety 3. Endometriosis  SH: Nonsmoker.  No ETOH.   FH: Grandfather with CHF.   ROS: All systems reviewed and negative except as per HPI.   Current Outpatient Prescriptions  Medication Sig Dispense Refill  . chlorpheniramine-HYDROcodone (TUSSIONEX) 10-8 MG/5ML LQCR Take 5 mLs by mouth every 12 (twelve) hours as needed for cough.   0  . Eluxadoline (VIBERZI) 100 MG TABS Take by mouth daily as needed.    [name] emollient (BIAFINE) cream Apply topically as needed.    . fluticasone (FLONASE) 50 MCG/ACT nasal spray SPRAY 2 SPRAYS IN EACH NOSTRILS  2  . furosemide (LASIX) 40 MG tablet Take 1 tablet (40 mg total) by mouth daily as needed. 60 tablet 0  . hyaluronate sodium (RADIAPLEXRX) GEL Apply 1 application topically 2 (two) times daily.    . hydrochlorothiazide (HYDRODIURIL) 25 MG tablet Take  25 mg by mouth daily.    [name] lidocaine-prilocaine (EMLA) cream Apply 1 application topically as needed. 30 g 0  . lidocaine-prilocaine (EMLA) cream Apply 1 application topically as needed. 30 g 2  . loperamide (IMODIUM) 2 MG capsule Take 2 mg by mouth as needed for diarrhea or loose stools.    . non-metallic deodorant Jethro Poling) MISC Apply 1 application topically daily as needed.    . ondansetron (ZOFRAN) 8 MG tablet TAKE 1 TABLET BY MOUTH TWICE A DAY . START THE DAY AFTER CHEMO FOR 2 DAYS, THEN USE AS NEEDED  1  . prochlorperazine (COMPAZINE) 10 MG tablet Take 1 tablet (10 mg total) by mouth every 6 (six) hours as needed (Nausea or vomiting). 30 tablet 1  . RESTASIS 0.05 % ophthalmic emulsion INSTILL ONE DROP IN AFFECTED EYE 2 TIMES A DAY  4  . silver sulfADIAZINE (SILVADENE) 1 % cream Apply 1 application topically 2 (two) times daily.    . tamoxifen (NOLVADEX) 20 MG tablet Take 1 tablet (20 mg total) by mouth daily. 90 tablet 3  . valACYclovir (VALTREX) 500 MG tablet      No current facility-administered medications for this encounter.   BP 128/86 mmHg  Pulse 80  Wt 242 lb (109.77 kg)  SpO2 100% General: NAD Neck: No JVD, no thyromegaly or thyroid nodule.  Lungs: Clear to auscultation bilaterally with normal respiratory effort. CV: Nondisplaced PMI.  Heart regular S1/S2,  no S3/S4, no murmur.  No peripheral edema.  No carotid bruit.  Normal pedal pulses.  Abdomen: Soft, nontender, no hepatosplenomegaly, no distention.  Skin: Intact without lesions or rashes.  Neurologic: Alert and oriented x 3.  Psych: Normal affect. Extremities: No clubbing or cyanosis.  HEENT: Normal.   Assessment/Plan: 44 y.o. with breast cancer on Herceptin q 3 weeks to complete 1 year in 3/17.  So far doing well, no CHF symptoms.  I reviewed her prior echoes: stable LV function including EF and strain parameters. Echo done today looks ok.  We discussed the rationale behind serial echoes with Herceptin use.  She will  return in 3 month for echocardiogram and office visit.   Loralie Champagne 01/30/2015

## 2015-01-31 ENCOUNTER — Telehealth: Payer: Self-pay | Admitting: *Deleted

## 2015-01-31 NOTE — Telephone Encounter (Signed)
Call from [facility] reporting "feeling tired.  Does Tamoxifen make you feel tired?  I usually go to the Gym but this week I just want to lie around.   I'm eating, drinking with no problems.  Could my B12 level be low?" Checked lab results. K+ = 3.1 on 01-16-2015.  Reviewed foods high in potassium to eat.  Takes lasix.  Will notify Dr. Lindi Adie for any new orders or patient advice.  Return number [phone].  "I come in next week and can talk to him then.  I just want him to know how I'm feeling."   Next scheduled F/U is 02-05-2015 beginning at 10:30 lab

## 2015-02-05 ENCOUNTER — Ambulatory Visit (HOSPITAL_BASED_OUTPATIENT_CLINIC_OR_DEPARTMENT_OTHER): Payer: 59

## 2015-02-05 ENCOUNTER — Other Ambulatory Visit (HOSPITAL_BASED_OUTPATIENT_CLINIC_OR_DEPARTMENT_OTHER): Payer: 59

## 2015-02-05 ENCOUNTER — Ambulatory Visit (HOSPITAL_BASED_OUTPATIENT_CLINIC_OR_DEPARTMENT_OTHER): Payer: 59 | Admitting: Hematology and Oncology

## 2015-02-05 ENCOUNTER — Telehealth: Payer: Self-pay | Admitting: Hematology and Oncology

## 2015-02-05 ENCOUNTER — Encounter: Payer: Self-pay | Admitting: Hematology and Oncology

## 2015-02-05 VITALS — BP 127/74 | HR 98 | Temp 98.3°F | Resp 19 | Ht 65.0 in | Wt 247.8 lb

## 2015-02-05 DIAGNOSIS — Z5112 Encounter for antineoplastic immunotherapy: Secondary | ICD-10-CM

## 2015-02-05 DIAGNOSIS — R5383 Other fatigue: Secondary | ICD-10-CM

## 2015-02-05 DIAGNOSIS — C50311 Malignant neoplasm of lower-inner quadrant of right female breast: Secondary | ICD-10-CM

## 2015-02-05 DIAGNOSIS — Z23 Encounter for immunization: Secondary | ICD-10-CM | POA: Diagnosis not present

## 2015-02-05 DIAGNOSIS — Z170 Estrogen receptor positive status [ER+]: Secondary | ICD-10-CM

## 2015-02-05 LAB — CBC WITH DIFFERENTIAL/PLATELET
BASO%: 0.2 % (ref 0.0–2.0)
Basophils Absolute: 0 10e3/uL (ref 0.0–0.1)
EOS%: 1.1 % (ref 0.0–7.0)
Eosinophils Absolute: 0.1 10e3/uL (ref 0.0–0.5)
HCT: 37.4 % (ref 34.8–46.6)
HGB: 12.5 g/dL (ref 11.6–15.9)
LYMPH%: 21.4 % (ref 14.0–49.7)
MCH: 31.1 pg (ref 25.1–34.0)
MCHC: 33.4 g/dL (ref 31.5–36.0)
MCV: 93 fL (ref 79.5–101.0)
MONO#: 0.7 10*3/uL (ref 0.1–0.9)
MONO%: 8.9 % (ref 0.0–14.0)
NEUT#: 5.6 10e3/uL (ref 1.5–6.5)
NEUT%: 68.4 % (ref 38.4–76.8)
Platelets: 312 10*3/uL (ref 145–400)
RBC: 4.02 10*6/uL (ref 3.70–5.45)
RDW: 13.9 % (ref 11.2–14.5)
WBC: 8.1 10e3/uL (ref 3.9–10.3)
lymph#: 1.7 10e3/uL (ref 0.9–3.3)

## 2015-02-05 LAB — COMPREHENSIVE METABOLIC PANEL (CC13)
ALT: 20 U/L (ref 0–55)
AST: 18 U/L (ref 5–34)
Albumin: 3.3 g/dL — ABNORMAL LOW (ref 3.5–5.0)
Alkaline Phosphatase: 64 U/L (ref 40–150)
Anion Gap: 6 mEq/L (ref 3–11)
BUN: 13.8 mg/dL (ref 7.0–26.0)
CO2: 30 meq/L — ABNORMAL HIGH (ref 22–29)
Calcium: 9.1 mg/dL (ref 8.4–10.4)
Chloride: 106 meq/L (ref 98–109)
Creatinine: 1 mg/dL (ref 0.6–1.1)
EGFR: 78 mL/min/{1.73_m2} — ABNORMAL LOW (ref >90)
Glucose: 115 mg/dL (ref 70–140)
Potassium: 3.7 meq/L (ref 3.5–5.1)
Sodium: 141 meq/L (ref 136–145)
Total Bilirubin: 0.36 mg/dL (ref 0.20–1.20)
Total Protein: 7.1 g/dL (ref 6.4–8.3)

## 2015-02-05 MED ORDER — DIPHENHYDRAMINE HCL 25 MG PO CAPS
ORAL_CAPSULE | ORAL | Status: AC
Start: 2015-02-05 — End: 2015-02-05
  Filled 2015-02-05: qty 2

## 2015-02-05 MED ORDER — SODIUM CHLORIDE 0.9 % IV SOLN
Freq: Once | INTRAVENOUS | Status: AC
  Administered 2015-02-05: 14:00:00 via INTRAVENOUS

## 2015-02-05 MED ORDER — SODIUM CHLORIDE 0.9 % IV SOLN
6.0000 mg/kg | Freq: Once | INTRAVENOUS | Status: AC
  Administered 2015-02-05: 672 mg via INTRAVENOUS
  Filled 2015-02-05: qty 32

## 2015-02-05 MED ORDER — INFLUENZA VAC SPLIT QUAD 0.5 ML IM SUSY
0.5000 mL | PREFILLED_SYRINGE | Freq: Once | INTRAMUSCULAR | Status: AC
  Administered 2015-02-05: 0.5 mL via INTRAMUSCULAR
  Filled 2015-02-05: qty 0.5

## 2015-02-05 MED ORDER — SODIUM CHLORIDE 0.9 % IJ SOLN
10.0000 mL | INTRAMUSCULAR | Status: DC | PRN
  Administered 2015-02-05: 10 mL
  Filled 2015-02-05: qty 10

## 2015-02-05 MED ORDER — HEPARIN SOD (PORK) LOCK FLUSH 100 UNIT/ML IV SOLN
500.0000 [IU] | Freq: Once | INTRAVENOUS | Status: AC | PRN
  Administered 2015-02-05: 500 [IU]
  Filled 2015-02-05: qty 5

## 2015-02-05 MED ORDER — ACETAMINOPHEN 325 MG PO TABS
ORAL_TABLET | ORAL | Status: AC
  Filled 2015-02-05: qty 2

## 2015-02-05 MED ORDER — DIPHENHYDRAMINE HCL 25 MG PO CAPS
50.0000 mg | ORAL_CAPSULE | Freq: Once | ORAL | Status: AC
Start: 2015-02-05 — End: 2015-02-05
  Administered 2015-02-05: 50 mg via ORAL

## 2015-02-05 MED ORDER — ACETAMINOPHEN 325 MG PO TABS
650.0000 mg | ORAL_TABLET | Freq: Once | ORAL | Status: AC
Start: 2015-02-05 — End: 2015-02-05
  Administered 2015-02-05: 650 mg via ORAL

## 2015-02-05 NOTE — Patient Instructions (Signed)
[facility] Discharge Instructions for Patients Receiving Chemotherapy  Today you received the following chemotherapy agents:  Herceptin  To help prevent nausea and vomiting after your treatment, we encourage you to take your nausea medication as prescribed.   If you develop nausea and vomiting that is not controlled by your nausea medication, call the clinic.   BELOW ARE SYMPTOMS THAT SHOULD BE REPORTED IMMEDIATELY:  *FEVER GREATER THAN 100.5 F  *CHILLS WITH OR WITHOUT FEVER  NAUSEA AND VOMITING THAT IS NOT CONTROLLED WITH YOUR NAUSEA MEDICATION  *UNUSUAL SHORTNESS OF BREATH  *UNUSUAL BRUISING OR BLEEDING  TENDERNESS IN MOUTH AND THROAT WITH OR WITHOUT PRESENCE OF ULCERS  *URINARY PROBLEMS  *BOWEL PROBLEMS  UNUSUAL RASH Items with * indicate a potential emergency and should be followed up as soon as possible.  Feel free to call the clinic you have any questions or concerns. The clinic phone number is [phone].  Please show the CHEMO ALERT CARD at check-in to the Emergency Department and triage nurse.   

## 2015-02-05 NOTE — Progress Notes (Signed)
Patient Care Team: Albina Billet, MD as PCP - General (Internal Medicine) Albina Billet, MD (Internal Medicine)  DIAGNOSIS: No matching staging information was found for the patient.  SUMMARY OF ONCOLOGIC HISTORY:   Breast cancer of lower-inner quadrant of right female breast ([place])   05/16/2014 Initial Diagnosis Ultrasound-guided biopsy: Invasive ductal carcinoma with DCIS, grade 1-2, ER/PR positive, Ki-67 43%, HER-2 positive   05/28/2014 Breast MRI Right breast: 1.4 x 1.2 x 1 cm oval enhancing mass lower inner quadrant   06/18/2014 Surgery Right breast lumpectomy: Invasive ductal carcinoma 1.3 cm negative for LVI; DCIS 1 mm from margins, 1 sentinel node negative, grade 3 ER 95%, PR 77%, HER-2 amplified ratio 8.25, Ki-67 43% T1 cN0 M0 stage IA   07/18/2014 - 10/03/2014 Chemotherapy Abraxane Herceptin weekly 12 followed by Herceptin maintenance every 3 weeks   11/01/2014 - 12/17/2014 Radiation Therapy Adjuvant radiation therapy   01/01/2015 -  Anti-estrogen oral therapy Tamoxifen 20 mg daily    CHIEF COMPLIANT: Follow-up on tamoxifen  INTERVAL HISTORY: Krystal Kelley is a 44 year old with above-mentioned history of right breast cancer currently on Herceptin maintenance as well as recently started tamoxifen. She appears to be tolerating it fairly well. She does have emotional changes especially tearfulness as well as anxiety spells. Apart from that she is tolerating it fairly well. Denies any hot flashes or myalgias.  REVIEW OF SYSTEMS:   Constitutional: Denies fevers, chills or abnormal weight loss Eyes: Denies blurriness of vision Ears, nose, mouth, throat, and face: Denies mucositis or sore throat Respiratory: Denies cough, dyspnea or wheezes Cardiovascular: Denies palpitation, chest discomfort or lower extremity swelling Gastrointestinal:  Denies nausea, heartburn or change in bowel habits Skin: Denies abnormal skin rashes Lymphatics: Denies new lymphadenopathy or easy  bruising Neurological:Denies numbness, tingling or new weaknesses Behavioral/Psych: Emotional changes and tearfulness and occasional anxiety Breast:  denies any pain or lumps or nodules in either breasts All other systems were reviewed with the patient and are negative.  I have reviewed the past medical history, past surgical history, social history and family history with the patient and they are unchanged from previous note.  ALLERGIES:  has No Known Allergies.  MEDICATIONS:  Current Outpatient Prescriptions  Medication Sig Dispense Refill  . chlorpheniramine-HYDROcodone (TUSSIONEX) 10-8 MG/5ML LQCR Take 5 mLs by mouth every 12 (twelve) hours as needed for cough.   0  . Eluxadoline (VIBERZI) 100 MG TABS Take by mouth daily as needed.    [name] emollient (BIAFINE) cream Apply topically as needed.    . fluticasone (FLONASE) 50 MCG/ACT nasal spray SPRAY 2 SPRAYS IN EACH NOSTRILS  2  . hyaluronate sodium (RADIAPLEXRX) GEL Apply 1 application topically 2 (two) times daily.    . hydrochlorothiazide (HYDRODIURIL) 25 MG tablet Take 25 mg by mouth daily.    [name] lidocaine-prilocaine (EMLA) cream Apply 1 application topically as needed. 30 g 0  . lidocaine-prilocaine (EMLA) cream Apply 1 application topically as needed. 30 g 2  . loperamide (IMODIUM) 2 MG capsule Take 2 mg by mouth as needed for diarrhea or loose stools.    . non-metallic deodorant Jethro Poling) MISC Apply 1 application topically daily as needed.    . ondansetron (ZOFRAN) 8 MG tablet TAKE 1 TABLET BY MOUTH TWICE A DAY . START THE DAY AFTER CHEMO FOR 2 DAYS, THEN USE AS NEEDED  1  . prochlorperazine (COMPAZINE) 10 MG tablet Take 1 tablet (10 mg total) by mouth every 6 (six) hours as needed (Nausea or vomiting). 30 tablet  1  . RESTASIS 0.05 % ophthalmic emulsion INSTILL ONE DROP IN AFFECTED EYE 2 TIMES A DAY  4  . silver sulfADIAZINE (SILVADENE) 1 % cream Apply 1 application topically 2 (two) times daily.    . tamoxifen (NOLVADEX) 20 MG tablet  Take 1 tablet (20 mg total) by mouth daily. 90 tablet 3  . valACYclovir (VALTREX) 500 MG tablet      No current facility-administered medications for this visit.    PHYSICAL EXAMINATION: ECOG PERFORMANCE STATUS: 0 - Asymptomatic  Filed Vitals:   02/05/15 1048  BP: 127/74  Pulse: 98  Temp: 98.3 F (36.8 C)  Resp: 19   Filed Weights   02/05/15 1048  Weight: 247 lb 12.8 oz (112.401 kg)    GENERAL:alert, no distress and comfortable SKIN: skin color, texture, turgor are normal, no rashes or significant lesions EYES: normal, Conjunctiva are pink and non-injected, sclera clear OROPHARYNX:no exudate, no erythema and lips, buccal mucosa, and tongue normal  NECK: supple, thyroid normal size, non-tender, without nodularity LYMPH:  no palpable lymphadenopathy in the cervical, axillary or inguinal LUNGS: clear to auscultation and percussion with normal breathing effort HEART: regular rate & rhythm and no murmurs and no lower extremity edema ABDOMEN:abdomen soft, non-tender and normal bowel sounds Musculoskeletal:no cyanosis of digits and no clubbing  NEURO: alert & oriented x 3 with fluent speech, no focal motor/sensory deficits  LABORATORY DATA:  I have reviewed the data as listed   Chemistry      Component Value Date/Time   NA 141 02/05/2015 1045   NA 140 08/01/2014 0918   NA 135* 06/11/2013 0408   K 3.7 02/05/2015 1045   K 4.0 08/01/2014 0918   K 3.2* 06/11/2013 0408   CL 107 08/01/2014 0918   CL 102 06/11/2013 0408   CO2 30* 02/05/2015 1045   CO2 28 08/01/2014 0918   CO2 26 06/11/2013 0408   BUN 13.8 02/05/2015 1045   BUN 12 08/01/2014 0918   BUN 15 06/11/2013 0408   CREATININE 1.0 02/05/2015 1045   CREATININE 0.93 08/01/2014 0918   CREATININE 1.16 06/11/2013 0408      Component Value Date/Time   CALCIUM 9.1 02/05/2015 1045   CALCIUM 8.8 08/01/2014 0918   CALCIUM 8.7 06/11/2013 0408   ALKPHOS 64 02/05/2015 1045   ALKPHOS 54 08/01/2014 0918   ALKPHOS 75 06/12/2013  1541   AST 18 02/05/2015 1045   AST 41* 08/01/2014 0918   AST 137* 06/12/2013 1541   ALT 20 02/05/2015 1045   ALT 54* 08/01/2014 0918   ALT 192* 06/12/2013 1541   BILITOT 0.36 02/05/2015 1045   BILITOT 0.2* 08/01/2014 0918   BILITOT 0.5 06/12/2013 1541       Lab Results  Component Value Date   WBC 8.1 02/05/2015   HGB 12.5 02/05/2015   HCT 37.4 02/05/2015   MCV 93.0 02/05/2015   PLT 312 02/05/2015   NEUTROABS 5.6 02/05/2015   ASSESSMENT & PLAN:  Breast cancer of lower-inner quadrant of right female breast Right breast lumpectomy 06/18/2014: Invasive ductal carcinoma 1.3 cm negative for LVI; DCIS 1 mm from margins, 1 sentinel node negative, grade 3 ER 95%, PR 77%, HER-2 amplified ratio 8.25, Ki-67 43% T1 cN0 M0 stage IA  Treatment summary S/P Adjuvant chemo with Abraxane Herceptin started 07/18/2014 completed 10/03/14 Status post adjuvant radiation completed 12/17/2014 Started tamoxifen 12/26/2014  Current treatment: 1. Continue maintenance Herceptin every 3 weeks until March 2017 2. Started antiestrogen therapy with tamoxifen 20 mg daily 12/26/2014  Tamoxifen toxicities:  1. Emotional changes especially crying spells and anxiety spells Denies any hot flashes. She actually does not want to talk about hot flashes.  Fatigue: I discussed with her about increasing her physical activity and losing weight.  Leg edema: It is better so we stopped Lasix today. Flu shot was given today. Return to clinic every 6 weeks for follow-up   No orders of the defined types were placed in this encounter.   The patient has a good understanding of the overall plan. she agrees with it. she will call with any problems that may develop before the next visit here.   Rulon Eisenmenger, MD 02/05/2015

## 2015-02-05 NOTE — Assessment & Plan Note (Signed)
Right breast lumpectomy 06/18/2014: Invasive ductal carcinoma 1.3 cm negative for LVI; DCIS 1 mm from margins, 1 sentinel node negative, grade 3 ER 95%, PR 77%, HER-2 amplified ratio 8.25, Ki-67 43% T1 cN0 M0 stage IA  Treatment summary S/P Adjuvant chemo with Abraxane Herceptin started 07/18/2014 completed 10/03/14 Status post adjuvant radiation completed 12/17/2014 Started tamoxifen 12/26/2014  Current treatment: 1. Continue maintenance Herceptin every 3 weeks until March 2017 2. Started antiestrogen therapy with tamoxifen 20 mg daily 12/26/2014  Tamoxifen toxicities:   Leg edema: Treated with Lasix  Return to clinic every 6 weeks for follow-up

## 2015-02-05 NOTE — Addendum Note (Signed)
Addended by: Prentiss Bells on: 02/05/2015 05:53 PM   Modules accepted: Orders, Medications

## 2015-02-05 NOTE — Telephone Encounter (Signed)
Appointments made and moved to thurs per patiet req and she will get a new avs in chemo today

## 2015-02-06 ENCOUNTER — Other Ambulatory Visit: Payer: 59

## 2015-02-06 ENCOUNTER — Ambulatory Visit: Payer: 59 | Admitting: Nurse Practitioner

## 2015-02-06 ENCOUNTER — Ambulatory Visit: Payer: 59

## 2015-02-10 NOTE — Telephone Encounter (Signed)
Was seen 02-05-2015 for F/U

## 2015-02-17 ENCOUNTER — Telehealth: Payer: Self-pay

## 2015-02-17 NOTE — Telephone Encounter (Signed)
Pt reports she experienced swelling of legs, ankles and feet when she stopped taking lasix.  She resumed the lasix and swelling has subsided some.  Requested refill when appropriate.  Let pt know I would notify MD.    Pt reports still having problems with disability.   Form not scanned to chart yet.  Pt will fax copy to this office for an update.  Pt reports problems with depression.  Spoke with patient extensively about this.  Pt has been on anti-hormonal for app 3 weeks.  Advised pt to give it at least another month and that there are other options such as different meds and anti-depressants.  Advised pt to call us, pastor, somebody if she has thoughts of hurting herself.  Pt agreed and voiced understanding.

## 2015-02-19 ENCOUNTER — Telehealth: Payer: Self-pay | Admitting: *Deleted

## 2015-02-19 NOTE — Telephone Encounter (Signed)
Zeynab called asking to "speak with Terri RN or I ned her e-mail address".  Offered assistance.  Declined stating "she is helping me with a form".  Call transferred to [phone].  Voicemail received.

## 2015-02-20 ENCOUNTER — Telehealth: Payer: Self-pay

## 2015-02-20 NOTE — Telephone Encounter (Signed)
Pt called to make sure we received the papers she faxed. S/w Terri and they are received. Pt then said to tell Terri there is a 10 day deadline on the papers. Message forwarded.

## 2015-02-24 ENCOUNTER — Encounter: Payer: Self-pay | Admitting: Hematology and Oncology

## 2015-02-24 ENCOUNTER — Ambulatory Visit (INDEPENDENT_AMBULATORY_CARE_PROVIDER_SITE_OTHER): Payer: 59 | Admitting: Podiatry

## 2015-02-24 ENCOUNTER — Encounter: Payer: Self-pay | Admitting: Podiatry

## 2015-02-24 VITALS — BP 154/98 | HR 69 | Resp 12

## 2015-02-24 DIAGNOSIS — G5792 Unspecified mononeuropathy of left lower limb: Secondary | ICD-10-CM

## 2015-02-24 NOTE — Progress Notes (Signed)
I placed [employer] form on desk of nurse for dr Lindi Adie

## 2015-02-24 NOTE — Progress Notes (Signed)
She presents today for follow-up of her neuritis left heel. She states it seems to be doing much better. She continues her chemotherapy for breast cancer. At this point she's doing very well she says.  Objective: Vital signs are stable she's alert and oriented 3. Pulses are strongly palpable. She has some tenderness on palpation of the posterior medial aspect of the left heel nothing on the right foot.  Assessment: Well-healing neuritis left foot.  Plan: Follow up with her in 1 or 2 months.

## 2015-02-26 ENCOUNTER — Encounter: Payer: Self-pay | Admitting: Hematology and Oncology

## 2015-02-26 NOTE — Progress Notes (Signed)
I faxed [employer] form again [phone]

## 2015-02-27 ENCOUNTER — Ambulatory Visit (HOSPITAL_BASED_OUTPATIENT_CLINIC_OR_DEPARTMENT_OTHER): Payer: 59

## 2015-02-27 ENCOUNTER — Other Ambulatory Visit (HOSPITAL_BASED_OUTPATIENT_CLINIC_OR_DEPARTMENT_OTHER): Payer: 59

## 2015-02-27 VITALS — BP 145/89 | HR 88 | Temp 99.0°F | Resp 18

## 2015-02-27 DIAGNOSIS — Z5112 Encounter for antineoplastic immunotherapy: Secondary | ICD-10-CM

## 2015-02-27 DIAGNOSIS — C50311 Malignant neoplasm of lower-inner quadrant of right female breast: Secondary | ICD-10-CM | POA: Diagnosis not present

## 2015-02-27 LAB — CBC WITH DIFFERENTIAL/PLATELET
BASO%: 0.3 % (ref 0.0–2.0)
Basophils Absolute: 0 10e3/uL (ref 0.0–0.1)
EOS%: 0.9 % (ref 0.0–7.0)
Eosinophils Absolute: 0.1 10*3/uL (ref 0.0–0.5)
HCT: 37.4 % (ref 34.8–46.6)
HGB: 12.5 g/dL (ref 11.6–15.9)
LYMPH%: 20.1 % (ref 14.0–49.7)
MCH: 31.2 pg (ref 25.1–34.0)
MCHC: 33.4 g/dL (ref 31.5–36.0)
MCV: 93.3 fL (ref 79.5–101.0)
MONO#: 0.6 10e3/uL (ref 0.1–0.9)
MONO%: 7.6 % (ref 0.0–14.0)
NEUT#: 5.6 10*3/uL (ref 1.5–6.5)
NEUT%: 71.1 % (ref 38.4–76.8)
Platelets: 297 10*3/uL (ref 145–400)
RBC: 4.01 10*6/uL (ref 3.70–5.45)
RDW: 14.2 % (ref 11.2–14.5)
WBC: 7.9 10*3/uL (ref 3.9–10.3)
lymph#: 1.6 10e3/uL (ref 0.9–3.3)

## 2015-02-27 LAB — COMPREHENSIVE METABOLIC PANEL (CC13)
ALT: 21 U/L (ref 0–55)
AST: 19 U/L (ref 5–34)
Albumin: 3.3 g/dL — ABNORMAL LOW (ref 3.5–5.0)
Alkaline Phosphatase: 59 U/L (ref 40–150)
Anion Gap: 8 mEq/L (ref 3–11)
BUN: 14.4 mg/dL (ref 7.0–26.0)
CO2: 27 mEq/L (ref 22–29)
Calcium: 9.1 mg/dL (ref 8.4–10.4)
Chloride: 106 meq/L (ref 98–109)
Creatinine: 1.1 mg/dL (ref 0.6–1.1)
EGFR: 73 mL/min/{1.73_m2} — ABNORMAL LOW (ref >90)
Glucose: 96 mg/dL (ref 70–140)
Potassium: 3.7 meq/L (ref 3.5–5.1)
Sodium: 141 meq/L (ref 136–145)
Total Bilirubin: 0.4 mg/dL (ref 0.20–1.20)
Total Protein: 7.2 g/dL (ref 6.4–8.3)

## 2015-02-27 MED ORDER — DIPHENHYDRAMINE HCL 25 MG PO CAPS
50.0000 mg | ORAL_CAPSULE | Freq: Once | ORAL | Status: AC
  Administered 2015-02-27: 50 mg via ORAL

## 2015-02-27 MED ORDER — SODIUM CHLORIDE 0.9 % IV SOLN
Freq: Once | INTRAVENOUS | Status: AC
  Administered 2015-02-27: 12:00:00 via INTRAVENOUS

## 2015-02-27 MED ORDER — HEPARIN SOD (PORK) LOCK FLUSH 100 UNIT/ML IV SOLN
500.0000 [IU] | Freq: Once | INTRAVENOUS | Status: AC | PRN
  Administered 2015-02-27: 500 [IU]
  Filled 2015-02-27: qty 5

## 2015-02-27 MED ORDER — SODIUM CHLORIDE 0.9 % IJ SOLN
10.0000 mL | INTRAMUSCULAR | Status: DC | PRN
  Administered 2015-02-27: 10 mL
  Filled 2015-02-27: qty 10

## 2015-02-27 MED ORDER — ACETAMINOPHEN 325 MG PO TABS
650.0000 mg | ORAL_TABLET | Freq: Once | ORAL | Status: AC
  Administered 2015-02-27: 650 mg via ORAL

## 2015-02-27 MED ORDER — ACETAMINOPHEN 325 MG PO TABS
ORAL_TABLET | ORAL | Status: AC
  Filled 2015-02-27: qty 2

## 2015-02-27 MED ORDER — DIPHENHYDRAMINE HCL 25 MG PO CAPS
ORAL_CAPSULE | ORAL | Status: AC
  Filled 2015-02-27: qty 2

## 2015-02-27 MED ORDER — TRASTUZUMAB CHEMO INJECTION 440 MG
6.0000 mg/kg | Freq: Once | INTRAVENOUS | Status: AC
  Administered 2015-02-27: 672 mg via INTRAVENOUS
  Filled 2015-02-27: qty 32

## 2015-02-27 NOTE — Patient Instructions (Signed)
[facility] Discharge Instructions for Patients Receiving Chemotherapy  Today you received the following chemotherapy agents: Herceptin   To help prevent nausea and vomiting after your treatment, we encourage you to take your nausea medication as directed.    If you develop nausea and vomiting that is not controlled by your nausea medication, call the clinic.   BELOW ARE SYMPTOMS THAT SHOULD BE REPORTED IMMEDIATELY:  *FEVER GREATER THAN 100.5 F  *CHILLS WITH OR WITHOUT FEVER  NAUSEA AND VOMITING THAT IS NOT CONTROLLED WITH YOUR NAUSEA MEDICATION  *UNUSUAL SHORTNESS OF BREATH  *UNUSUAL BRUISING OR BLEEDING  TENDERNESS IN MOUTH AND THROAT WITH OR WITHOUT PRESENCE OF ULCERS  *URINARY PROBLEMS  *BOWEL PROBLEMS  UNUSUAL RASH Items with * indicate a potential emergency and should be followed up as soon as possible.  Feel free to call the clinic you have any questions or concerns. The clinic phone number is [phone].  Please show the CHEMO ALERT CARD at check-in to the Emergency Department and triage nurse.   

## 2015-02-28 ENCOUNTER — Encounter: Payer: Self-pay | Admitting: Hematology and Oncology

## 2015-02-28 NOTE — Progress Notes (Signed)
I placed forms from [employer] on desk of nurse for dr. Lindi Adie

## 2015-03-03 ENCOUNTER — Encounter: Payer: Self-pay | Admitting: Hematology and Oncology

## 2015-03-03 NOTE — Progress Notes (Signed)
I faxed [employer] [phone]

## 2015-03-20 ENCOUNTER — Ambulatory Visit (HOSPITAL_BASED_OUTPATIENT_CLINIC_OR_DEPARTMENT_OTHER): Payer: 59

## 2015-03-20 ENCOUNTER — Ambulatory Visit (HOSPITAL_BASED_OUTPATIENT_CLINIC_OR_DEPARTMENT_OTHER): Payer: 59 | Admitting: Hematology and Oncology

## 2015-03-20 ENCOUNTER — Encounter: Payer: Self-pay | Admitting: Hematology and Oncology

## 2015-03-20 ENCOUNTER — Telehealth: Payer: Self-pay | Admitting: Hematology and Oncology

## 2015-03-20 ENCOUNTER — Other Ambulatory Visit (HOSPITAL_BASED_OUTPATIENT_CLINIC_OR_DEPARTMENT_OTHER): Payer: 59

## 2015-03-20 VITALS — BP 146/92 | HR 89 | Temp 98.5°F | Resp 18 | Ht 65.0 in | Wt 257.2 lb

## 2015-03-20 DIAGNOSIS — Z5112 Encounter for antineoplastic immunotherapy: Secondary | ICD-10-CM

## 2015-03-20 DIAGNOSIS — C50311 Malignant neoplasm of lower-inner quadrant of right female breast: Secondary | ICD-10-CM

## 2015-03-20 DIAGNOSIS — Z170 Estrogen receptor positive status [ER+]: Secondary | ICD-10-CM | POA: Diagnosis not present

## 2015-03-20 DIAGNOSIS — E669 Obesity, unspecified: Secondary | ICD-10-CM | POA: Insufficient documentation

## 2015-03-20 LAB — CBC WITH DIFFERENTIAL/PLATELET
BASO%: 0.3 % (ref 0.0–2.0)
Basophils Absolute: 0 10e3/uL (ref 0.0–0.1)
EOS%: 1.2 % (ref 0.0–7.0)
Eosinophils Absolute: 0.1 10*3/uL (ref 0.0–0.5)
HCT: 36.1 % (ref 34.8–46.6)
HGB: 12 g/dL (ref 11.6–15.9)
LYMPH%: 19.2 % (ref 14.0–49.7)
MCH: 31.3 pg (ref 25.1–34.0)
MCHC: 33.2 g/dL (ref 31.5–36.0)
MCV: 94 fL (ref 79.5–101.0)
MONO#: 0.7 10*3/uL (ref 0.1–0.9)
MONO%: 7.8 % (ref 0.0–14.0)
NEUT#: 6.8 10*3/uL — ABNORMAL HIGH (ref 1.5–6.5)
NEUT%: 71.5 % (ref 38.4–76.8)
Platelets: 275 10*3/uL (ref 145–400)
RBC: 3.84 10*6/uL (ref 3.70–5.45)
RDW: 13.9 % (ref 11.2–14.5)
WBC: 9.5 10*3/uL (ref 3.9–10.3)
lymph#: 1.8 10e3/uL (ref 0.9–3.3)

## 2015-03-20 LAB — COMPREHENSIVE METABOLIC PANEL (CC13)
ALT: 20 U/L (ref 0–55)
AST: 24 U/L (ref 5–34)
Albumin: 3.3 g/dL — ABNORMAL LOW (ref 3.5–5.0)
Alkaline Phosphatase: 58 U/L (ref 40–150)
Anion Gap: 7 mEq/L (ref 3–11)
BUN: 15.9 mg/dL (ref 7.0–26.0)
CO2: 29 meq/L (ref 22–29)
Calcium: 9 mg/dL (ref 8.4–10.4)
Chloride: 106 meq/L (ref 98–109)
Creatinine: 1.1 mg/dL (ref 0.6–1.1)
EGFR: 72 mL/min/{1.73_m2} — ABNORMAL LOW (ref >90)
Glucose: 101 mg/dL (ref 70–140)
Potassium: 3.7 meq/L (ref 3.5–5.1)
Sodium: 142 meq/L (ref 136–145)
Total Bilirubin: 0.3 mg/dL (ref 0.20–1.20)
Total Protein: 7.4 g/dL (ref 6.4–8.3)

## 2015-03-20 MED ORDER — TRASTUZUMAB CHEMO INJECTION 440 MG
6.0000 mg/kg | Freq: Once | INTRAVENOUS | Status: AC
  Administered 2015-03-20: 672 mg via INTRAVENOUS
  Filled 2015-03-20: qty 32

## 2015-03-20 MED ORDER — ACETAMINOPHEN 325 MG PO TABS
ORAL_TABLET | ORAL | Status: AC
  Filled 2015-03-20: qty 2

## 2015-03-20 MED ORDER — SODIUM CHLORIDE 0.9 % IJ SOLN
10.0000 mL | INTRAMUSCULAR | Status: DC | PRN
  Administered 2015-03-20: 10 mL
  Filled 2015-03-20: qty 10

## 2015-03-20 MED ORDER — ACETAMINOPHEN 325 MG PO TABS
650.0000 mg | ORAL_TABLET | Freq: Once | ORAL | Status: AC
  Administered 2015-03-20: 650 mg via ORAL

## 2015-03-20 MED ORDER — DIPHENHYDRAMINE HCL 25 MG PO CAPS
ORAL_CAPSULE | ORAL | Status: AC
  Filled 2015-03-20: qty 2

## 2015-03-20 MED ORDER — DIPHENHYDRAMINE HCL 25 MG PO CAPS
50.0000 mg | ORAL_CAPSULE | Freq: Once | ORAL | Status: AC
  Administered 2015-03-20: 50 mg via ORAL

## 2015-03-20 MED ORDER — SODIUM CHLORIDE 0.9 % IV SOLN
Freq: Once | INTRAVENOUS | Status: AC
  Administered 2015-03-20: 13:00:00 via INTRAVENOUS

## 2015-03-20 MED ORDER — HEPARIN SOD (PORK) LOCK FLUSH 100 UNIT/ML IV SOLN
500.0000 [IU] | Freq: Once | INTRAVENOUS | Status: AC | PRN
  Administered 2015-03-20: 500 [IU]
  Filled 2015-03-20: qty 5

## 2015-03-20 NOTE — Assessment & Plan Note (Signed)
Right breast lumpectomy 06/18/2014: Invasive ductal carcinoma 1.3 cm negative for LVI; DCIS 1 mm from margins, 1 sentinel node negative, grade 3 ER 95%, PR 77%, HER-2 amplified ratio 8.25, Ki-67 43% T1 cN0 M0 stage IA  Treatment summary S/P Adjuvant chemo with Abraxane Herceptin started 07/18/2014 completed 10/03/14 Status post adjuvant radiation completed 12/17/2014 Started tamoxifen 12/26/2014  Current treatment: 1. Continue maintenance Herceptin every 3 weeks until March 2017 2. Started antiestrogen therapy with tamoxifen 20 mg daily 12/26/2014  Tamoxifen toxicities:  1. Emotional changes especially crying spells and anxiety spells: Slowly improving with time Denies any hot flashes.  Fatigue: I discussed with her about increasing her physical activity and losing weight. I provided her instructions to join the [employer] live strong program however she lives in [place]. She will go to [facility] and see they have the same program there.  Weight gain: She had gained 17 pounds since diagnosis. I discussed with her extensively about ways to reduce her weight. She downloaded the [place] will try to monitor her food intake as well as her exercise and try to lose some weight.  Leg edema: It is better so we stopped Lasix today. Return to clinic every 6 weeks for follow-up

## 2015-03-20 NOTE — Progress Notes (Signed)
Patient Care Team: Albina Billet, MD as PCP - General (Internal Medicine) Albina Billet, MD (Internal Medicine)  DIAGNOSIS: No matching staging information was found for the patient.  SUMMARY OF ONCOLOGIC HISTORY:   Breast cancer of lower-inner quadrant of right female breast (Krystal Kelley)   05/16/2014 Initial Diagnosis Ultrasound-guided biopsy: Invasive ductal carcinoma with DCIS, grade 1-2, ER/PR positive, Ki-67 43%, HER-2 positive   05/28/2014 Breast MRI Right breast: 1.4 x 1.2 x 1 cm oval enhancing mass lower inner quadrant   06/18/2014 Surgery Right breast lumpectomy: Invasive ductal carcinoma 1.3 cm negative for LVI; DCIS 1 mm from margins, 1 sentinel node negative, grade 3 ER 95%, PR 77%, HER-2 amplified ratio 8.25, Ki-67 43% T1 cN0 M0 stage IA   07/18/2014 - 10/03/2014 Chemotherapy Abraxane Herceptin weekly 12 followed by Herceptin maintenance every 3 weeks   11/01/2014 - 12/17/2014 Radiation Therapy Adjuvant radiation therapy   01/01/2015 -  Anti-estrogen oral therapy Tamoxifen 20 mg daily    CHIEF COMPLIANT: Follow-up on tamoxifen and Herceptin  INTERVAL HISTORY: Krystal Kelley is a 44 year old African-American lady with above-mentioned history of right breast cancer treated with surgery followed by adjuvant chemotherapy followed by radiation and is currently on Herceptin maintenance. She was started on tamoxifen and she appears to be tolerating tamoxifen extremely well. She does not complain of much hot flashes or muscle aches or pains. She does complain of weight gain.  REVIEW OF SYSTEMS:   Constitutional: Denies fevers, chills or abnormal weight loss Eyes: Denies blurriness of vision Ears, nose, mouth, throat, and face: Denies mucositis or sore throat Respiratory: Denies cough, dyspnea or wheezes Cardiovascular: Denies palpitation, chest discomfort or lower extremity swelling Gastrointestinal:  Denies nausea, heartburn or change in bowel habits Skin: Denies abnormal skin  rashes Lymphatics: Denies new lymphadenopathy or easy bruising Neurological:Denies numbness, tingling or new weaknesses Behavioral/Psych: Mood is stable, no new changes  Breast:  denies any pain or lumps or nodules in either breasts All other systems were reviewed with the patient and are negative.  I have reviewed the past medical history, past surgical history, social history and family history with the patient and they are unchanged from previous note.  ALLERGIES:  has No Known Allergies.  MEDICATIONS:  Current Outpatient Prescriptions  Medication Sig Dispense Refill  . chlorpheniramine-HYDROcodone (TUSSIONEX) 10-8 MG/5ML LQCR Take 5 mLs by mouth every 12 (twelve) hours as needed for cough.   0  . Eluxadoline (VIBERZI) 100 MG TABS Take by mouth daily as needed.    . fluticasone (FLONASE) 50 MCG/ACT nasal spray SPRAY 2 SPRAYS IN EACH NOSTRILS  2  . hyaluronate sodium (RADIAPLEXRX) GEL Apply 1 application topically 2 (two) times daily.    . hydrochlorothiazide (HYDRODIURIL) 25 MG tablet Take 25 mg by mouth daily.    [name] lidocaine-prilocaine (EMLA) cream Apply 1 application topically as needed. 30 g 0  . RESTASIS 0.05 % ophthalmic emulsion INSTILL ONE DROP IN AFFECTED EYE 2 TIMES A DAY  4  . tamoxifen (NOLVADEX) 20 MG tablet Take 1 tablet (20 mg total) by mouth daily. 90 tablet 3  . valACYclovir (VALTREX) 500 MG tablet      No current facility-administered medications for this visit.    PHYSICAL EXAMINATION: ECOG PERFORMANCE STATUS: 1 - Symptomatic but completely ambulatory  Filed Vitals:   03/20/15 1104  BP: 146/92  Pulse: 89  Temp: 98.5 F (36.9 C)  Resp: 18   Filed Weights   03/20/15 1104  Weight: 257 lb 3.2 oz (116.665  kg)    GENERAL:alert, no distress and comfortable SKIN: skin color, texture, turgor are normal, no rashes or significant lesions EYES: normal, Conjunctiva are pink and non-injected, sclera clear OROPHARYNX:no exudate, no erythema and lips, buccal mucosa,  and tongue normal  NECK: supple, thyroid normal size, non-tender, without nodularity LYMPH:  no palpable lymphadenopathy in the cervical, axillary or inguinal LUNGS: clear to auscultation and percussion with normal breathing effort HEART: regular rate & rhythm and no murmurs and no lower extremity edema ABDOMEN:abdomen soft, non-tender and normal bowel sounds Musculoskeletal:no cyanosis of digits and no clubbing  NEURO: alert & oriented x 3 with fluent speech, no focal motor/sensory deficits   LABORATORY DATA:  I have reviewed the data as listed   Chemistry      Component Value Date/Time   NA 141 02/27/2015 1129   NA 140 08/01/2014 0918   NA 135* 06/11/2013 0408   K 3.7 02/27/2015 1129   K 4.0 08/01/2014 0918   K 3.2* 06/11/2013 0408   CL 107 08/01/2014 0918   CL 102 06/11/2013 0408   CO2 27 02/27/2015 1129   CO2 28 08/01/2014 0918   CO2 26 06/11/2013 0408   BUN 14.4 02/27/2015 1129   BUN 12 08/01/2014 0918   BUN 15 06/11/2013 0408   CREATININE 1.1 02/27/2015 1129   CREATININE 0.93 08/01/2014 0918   CREATININE 1.16 06/11/2013 0408      Component Value Date/Time   CALCIUM 9.1 02/27/2015 1129   CALCIUM 8.8 08/01/2014 0918   CALCIUM 8.7 06/11/2013 0408   ALKPHOS 59 02/27/2015 1129   ALKPHOS 54 08/01/2014 0918   ALKPHOS 75 06/12/2013 1541   AST 19 02/27/2015 1129   AST 41* 08/01/2014 0918   AST 137* 06/12/2013 1541   ALT 21 02/27/2015 1129   ALT 54* 08/01/2014 0918   ALT 192* 06/12/2013 1541   BILITOT 0.40 02/27/2015 1129   BILITOT 0.2* 08/01/2014 0918   BILITOT 0.5 06/12/2013 1541       Lab Results  Component Value Date   WBC 9.5 03/20/2015   HGB 12.0 03/20/2015   HCT 36.1 03/20/2015   MCV 94.0 03/20/2015   PLT 275 03/20/2015   NEUTROABS 6.8* 03/20/2015     RADIOGRAPHIC STUDIES: I have personally reviewed the radiology reports and agreed with their findings. No results found.   ASSESSMENT & PLAN:  No problem-specific assessment & plan notes found for  this encounter.   No orders of the defined types were placed in this encounter.   The patient has a good understanding of the overall plan. she agrees with it. she will call with any problems that may develop before the next visit here.   Rulon Eisenmenger, MD 03/20/2015

## 2015-03-20 NOTE — Addendum Note (Signed)
Addended by: Prentiss Bells on: 03/20/2015 06:25 PM   Modules accepted: Orders, Medications

## 2015-03-20 NOTE — Patient Instructions (Signed)
[facility] Discharge Instructions for Patients Receiving Chemotherapy  Today you received the following chemotherapy agents: Herceptin   To help prevent nausea and vomiting after your treatment, we encourage you to take your nausea medication as directed.    If you develop nausea and vomiting that is not controlled by your nausea medication, call the clinic.   BELOW ARE SYMPTOMS THAT SHOULD BE REPORTED IMMEDIATELY:  *FEVER GREATER THAN 100.5 F  *CHILLS WITH OR WITHOUT FEVER  NAUSEA AND VOMITING THAT IS NOT CONTROLLED WITH YOUR NAUSEA MEDICATION  *UNUSUAL SHORTNESS OF BREATH  *UNUSUAL BRUISING OR BLEEDING  TENDERNESS IN MOUTH AND THROAT WITH OR WITHOUT PRESENCE OF ULCERS  *URINARY PROBLEMS  *BOWEL PROBLEMS  UNUSUAL RASH Items with * indicate a potential emergency and should be followed up as soon as possible.  Feel free to call the clinic you have any questions or concerns. The clinic phone number is [phone].  Please show the CHEMO ALERT CARD at check-in to the Emergency Department and triage nurse.   

## 2015-03-20 NOTE — Telephone Encounter (Signed)
Gave patient avs report and appointments for December thru February.  °

## 2015-03-26 ENCOUNTER — Encounter (INDEPENDENT_AMBULATORY_CARE_PROVIDER_SITE_OTHER): Payer: 59 | Admitting: Podiatry

## 2015-03-26 NOTE — Progress Notes (Signed)
This encounter was created in error - please disregard.

## 2015-04-09 ENCOUNTER — Other Ambulatory Visit: Payer: Self-pay | Admitting: *Deleted

## 2015-04-09 ENCOUNTER — Ambulatory Visit: Payer: 59 | Admitting: Podiatry

## 2015-04-09 DIAGNOSIS — C50311 Malignant neoplasm of lower-inner quadrant of right female breast: Secondary | ICD-10-CM

## 2015-04-10 ENCOUNTER — Other Ambulatory Visit: Payer: Self-pay | Admitting: *Deleted

## 2015-04-10 ENCOUNTER — Ambulatory Visit (HOSPITAL_BASED_OUTPATIENT_CLINIC_OR_DEPARTMENT_OTHER): Payer: 59

## 2015-04-10 ENCOUNTER — Other Ambulatory Visit (HOSPITAL_BASED_OUTPATIENT_CLINIC_OR_DEPARTMENT_OTHER): Payer: 59

## 2015-04-10 DIAGNOSIS — C50311 Malignant neoplasm of lower-inner quadrant of right female breast: Secondary | ICD-10-CM | POA: Diagnosis not present

## 2015-04-10 DIAGNOSIS — Z5112 Encounter for antineoplastic immunotherapy: Secondary | ICD-10-CM | POA: Diagnosis not present

## 2015-04-10 LAB — COMPREHENSIVE METABOLIC PANEL WITH GFR
Albumin: 3.3 g/dL — ABNORMAL LOW (ref 3.5–5.0)
Alkaline Phosphatase: 59 U/L (ref 40–150)
Anion Gap: 9 meq/L (ref 3–11)
Creatinine: 1 mg/dL (ref 0.6–1.1)
EGFR: 75 ml/min/1.73 m2 — ABNORMAL LOW (ref >90)
Glucose: 106 mg/dL (ref 70–140)
Total Bilirubin: 0.35 mg/dL (ref 0.20–1.20)
Total Protein: 7.5 g/dL (ref 6.4–8.3)

## 2015-04-10 LAB — CBC WITH DIFFERENTIAL/PLATELET
BASO%: 0.2 % (ref 0.0–2.0)
Basophils Absolute: 0 10*3/uL (ref 0.0–0.1)
EOS%: 1.3 % (ref 0.0–7.0)
Eosinophils Absolute: 0.1 10*3/uL (ref 0.0–0.5)
HCT: 36.6 % (ref 34.8–46.6)
HGB: 12.4 g/dL (ref 11.6–15.9)
LYMPH%: 20 % (ref 14.0–49.7)
MCH: 31.8 pg (ref 25.1–34.0)
MCHC: 33.9 g/dL (ref 31.5–36.0)
MCV: 93.8 fL (ref 79.5–101.0)
MONO#: 0.4 10*3/uL (ref 0.1–0.9)
MONO%: 4.9 % (ref 0.0–14.0)
NEUT#: 6.2 10e3/uL (ref 1.5–6.5)
NEUT%: 73.6 % (ref 38.4–76.8)
Platelets: 309 10e3/uL (ref 145–400)
RBC: 3.9 10*6/uL (ref 3.70–5.45)
RDW: 13.6 % (ref 11.2–14.5)
WBC: 8.4 10e3/uL (ref 3.9–10.3)
lymph#: 1.7 10*3/uL (ref 0.9–3.3)

## 2015-04-10 LAB — COMPREHENSIVE METABOLIC PANEL
ALT: 29 U/L (ref 0–55)
AST: 31 U/L (ref 5–34)
BUN: 11.4 mg/dL (ref 7.0–26.0)
CO2: 29 mEq/L (ref 22–29)
Calcium: 8.8 mg/dL (ref 8.4–10.4)
Chloride: 103 mEq/L (ref 98–109)
Potassium: 3.3 mEq/L — ABNORMAL LOW (ref 3.5–5.1)
Sodium: 141 mEq/L (ref 136–145)

## 2015-04-10 MED ORDER — ACETAMINOPHEN 325 MG PO TABS
ORAL_TABLET | ORAL | Status: AC
  Filled 2015-04-10: qty 2

## 2015-04-10 MED ORDER — HEPARIN SOD (PORK) LOCK FLUSH 100 UNIT/ML IV SOLN
500.0000 [IU] | Freq: Once | INTRAVENOUS | Status: AC | PRN
  Administered 2015-04-10: 500 [IU]
  Filled 2015-04-10: qty 5

## 2015-04-10 MED ORDER — SODIUM CHLORIDE 0.9 % IV SOLN
Freq: Once | INTRAVENOUS | Status: AC
  Administered 2015-04-10: 12:00:00 via INTRAVENOUS

## 2015-04-10 MED ORDER — DIPHENHYDRAMINE HCL 25 MG PO CAPS
50.0000 mg | ORAL_CAPSULE | Freq: Once | ORAL | Status: AC
  Administered 2015-04-10: 50 mg via ORAL

## 2015-04-10 MED ORDER — ACETAMINOPHEN 325 MG PO TABS
650.0000 mg | ORAL_TABLET | Freq: Once | ORAL | Status: AC
  Administered 2015-04-10: 650 mg via ORAL

## 2015-04-10 MED ORDER — SODIUM CHLORIDE 0.9 % IV SOLN
6.0000 mg/kg | Freq: Once | INTRAVENOUS | Status: AC
  Administered 2015-04-10: 672 mg via INTRAVENOUS
  Filled 2015-04-10: qty 32

## 2015-04-10 MED ORDER — SODIUM CHLORIDE 0.9 % IJ SOLN
10.0000 mL | INTRAMUSCULAR | Status: DC | PRN
  Administered 2015-04-10: 10 mL
  Filled 2015-04-10: qty 10

## 2015-04-10 MED ORDER — DIPHENHYDRAMINE HCL 25 MG PO CAPS
ORAL_CAPSULE | ORAL | Status: AC
Start: 2015-04-10 — End: 2015-04-10
  Filled 2015-04-10: qty 2

## 2015-04-10 NOTE — Progress Notes (Signed)
Pt c/o tightness to her upper arm and across her right breast. Denies swelling or pain. Referral placed for out pt PT.

## 2015-04-10 NOTE — Patient Instructions (Signed)
[facility] Discharge Instructions for Patients Receiving Chemotherapy  Today you received the following chemotherapy agents Herceptin  To help prevent nausea and vomiting after your treatment, we encourage you to take your nausea medication    If you develop nausea and vomiting that is not controlled by your nausea medication, call the clinic.   BELOW ARE SYMPTOMS THAT SHOULD BE REPORTED IMMEDIATELY:  *FEVER GREATER THAN 100.5 F  *CHILLS WITH OR WITHOUT FEVER  NAUSEA AND VOMITING THAT IS NOT CONTROLLED WITH YOUR NAUSEA MEDICATION  *UNUSUAL SHORTNESS OF BREATH  *UNUSUAL BRUISING OR BLEEDING  TENDERNESS IN MOUTH AND THROAT WITH OR WITHOUT PRESENCE OF ULCERS  *URINARY PROBLEMS  *BOWEL PROBLEMS  UNUSUAL RASH Items with * indicate a potential emergency and should be followed up as soon as possible.  Feel free to call the clinic you have any questions or concerns. The clinic phone number is [phone].  Please show the CHEMO ALERT CARD at check-in to the Emergency Department and triage nurse.   

## 2015-04-15 ENCOUNTER — Other Ambulatory Visit: Payer: Self-pay | Admitting: *Deleted

## 2015-04-22 ENCOUNTER — Telehealth: Payer: Self-pay | Admitting: *Deleted

## 2015-04-22 ENCOUNTER — Ambulatory Visit: Payer: BLUE CROSS/BLUE SHIELD | Attending: Hematology and Oncology | Admitting: Physical Therapy

## 2015-04-22 DIAGNOSIS — M25511 Pain in right shoulder: Secondary | ICD-10-CM | POA: Diagnosis present

## 2015-04-22 DIAGNOSIS — M6281 Muscle weakness (generalized): Secondary | ICD-10-CM | POA: Insufficient documentation

## 2015-04-22 DIAGNOSIS — R531 Weakness: Secondary | ICD-10-CM

## 2015-04-22 DIAGNOSIS — R6889 Other general symptoms and signs: Secondary | ICD-10-CM | POA: Insufficient documentation

## 2015-04-22 DIAGNOSIS — M25611 Stiffness of right shoulder, not elsewhere classified: Secondary | ICD-10-CM

## 2015-04-22 NOTE — Telephone Encounter (Signed)
VM message received from pt stating that she is experiencing vaginal, perineal irritation. She thinks she has another yeast infection.

## 2015-04-22 NOTE — Telephone Encounter (Signed)
Spoke with patient re: symptoms below.  Advised pt to try OTC vaginal yeast cream.  Pt voiced understanding.  OV follow up with Dr. Lindi Adie next week.

## 2015-04-22 NOTE — Therapy (Signed)
 [place], [place], [address] Phone: [phone]   Fax:  [phone]  Physical Therapy Evaluation  Patient Details  Name: Krystal Kelley MRN: [id number] Date of Birth: 1970/11/11 Referring Provider: Nicholas Lose, MD  Encounter Date: 04/22/2015      PT End of Session - 04/22/15 1710    Visit Number 1   Number of Visits 9   Date for PT Re-Evaluation 05/22/15   PT Start Time 1520   PT Stop Time 1610   PT Time Calculation (min) 50 min   Activity Tolerance Patient tolerated treatment well   Behavior During Therapy [facility] for tasks assessed/performed      Past Medical History  Diagnosis Date  . Hypertension   . Breast cancer, right breast ([place])   . Radiation 10/30/14-12/17/14    right breast 50.4 gray    Past Surgical History  Procedure Laterality Date  . Bunion surgrery    . Carpel tunnel    . Breast biopsy  05/16/14  . Cholecystectomy  2015  . Portacath placement Left 06/18/2014    Procedure: INSERTION PORT-A-CATH;  Surgeon: Excell Seltzer, MD;  Location: [place];  Service: General;  Laterality: Left;    There were no vitals filed for this visit.  Visit Diagnosis:  Decreased ROM of right shoulder - Plan: PT plan of care cert/re-cert  Decreased strength - Plan: PT plan of care cert/re-cert  Impaired function of upper extremity - Plan: PT plan of care cert/re-cert      Subjective Assessment - 04/22/15 1533    Subjective My arm is weak and I'm tight in right breast and chest, especially with raising right arm.  Has to use left arm to raise right at times.  Right arm will hurt if she lies on it.  Reports a tremor when trying to lift a gallon jug out of the refrigerator.  Also reports some ankle stiffness.     Pertinent History Diagnosed with right breast cancer in February 2016; had lumpectomy June 18, 2014 with 4-5 lymph nodes removed (all negative).  Adjuvant chemotherapy  (still on herceptin) and radiation, completed August 2016.  Had superficial blood clot in right leg that caused swelling, so was put on Lasix. ON tamoxifen.    Patient Stated Goals get right arm stronger and working better   Currently in Pain? No/denies            [facility] PT Assessment - 04/22/15 0001    Assessment   Medical Diagnosis right breast cancer   Referring Provider Nicholas Lose, MD   Onset Date/Surgical Date 06/18/14  lumpectomy   Precautions   Precautions Other (comment)   Precaution Comments cancer precautions   Restrictions   Weight Bearing Restrictions No   Balance Screen   Has the patient fallen in the past 6 months No   Has the patient had a decrease in activity level because of a fear of falling?  No   Is the patient reluctant to leave their home because of a fear of falling?  No   Home Ecologist residence   Transport planner;Children;Other relatives   Type of [place] One level   Prior Function   Level of Independence Independent   Vocation Other (comment);Full time employment  on leave   Vocation Requirements medical billing; uses computer or paper; no heavy lifting   Leisure no regular exercise   Cognition  Overall Cognitive Status Within Functional Limits for tasks assessed   Observation/Other Assessments   Skin Integrity Right breast medical incision well-healed; SLNB incision difficult to see, it's so well-healed; breast is discolored from radiation.   Quick DASH  22.73   Posture/Postural Control   Posture/Postural Control No significant limitations   ROM / Strength   AROM / PROM / Strength AROM;Strength   AROM   AROM Assessment Site Shoulder   Right/Left Shoulder Right;Left   Right Shoulder Extension 60 Degrees  in standing   Right Shoulder Flexion 151 Degrees  in sitting   Right Shoulder ABduction 169 Degrees   Right Shoulder Internal Rotation --  WNL, in supine   Right Shoulder  External Rotation 70 Degrees   Right Shoulder Horizontal ABduction 13 Degrees   Left Shoulder Extension 55 Degrees   Left Shoulder Flexion 173 Degrees   Left Shoulder ABduction 195 Degrees   Left Shoulder Internal Rotation --  WNL   Left Shoulder External Rotation 90 Degrees   Left Shoulder Horizontal ABduction 26 Degrees   Strength   Overall Strength Comments right shoulder grossly 4+/5 throughout, left 5/5   Palpation   Palpation comment not palpated today, but patient reports right breast tissue is firmer than left   Ambulation/Gait   Ambulation/Gait Yes   Ambulation/Gait Assistance 7: Independent           LYMPHEDEMA/ONCOLOGY QUESTIONNAIRE - 04/22/15 1527    Type   Cancer Type right breast   Surgeries   Lumpectomy Date 06/18/14   Treatment   Past Chemotherapy Treatment Yes   Past Radiation Treatment Yes   Current Hormone Treatment Yes   Drug Name tamoxifen   Lymphedema Assessments   Lymphedema Assessments Upper extremities   Right Upper Extremity Lymphedema   10 cm Proximal to Olecranon Process 40 cm   Olecranon Process 29.2 cm   10 cm Proximal to Ulnar Styloid Process 27.7 cm   Just Proximal to Ulnar Styloid Process 19.2 cm   Across Hand at [employer] 21.7 cm   At [place] of 2nd Digit 6.3 cm   Left Upper Extremity Lymphedema   10 cm Proximal to Olecranon Process 41.7 cm   Olecranon Process 29.5 cm   10 cm Proximal to Ulnar Styloid Process 28.8 cm   Just Proximal to Ulnar Styloid Process 19.7 cm   Across Hand at [employer] 21.6 cm   At [place] of 2nd Digit 6.7 cm           Quick Dash - 04/22/15 0001    Open a tight or new jar No difficulty   Do heavy household chores (wash walls, wash floors) Mild difficulty   Carry a shopping bag or briefcase No difficulty   Wash your back Severe difficulty   Use a knife to cut food No difficulty   Recreational activities in which you take some force or impact through your arm, shoulder, or hand (golf, hammering,  tennis) Mild difficulty   During the past week, to what extent has your arm, shoulder or hand problem interfered with your normal social activities with family, friends, neighbors, or groups? Modererately   During the past week, to what extent has your arm, shoulder or hand problem limited your work or other regular daily activities Slightly   Arm, shoulder, or hand pain. Mild   Tingling (pins and needles) in your arm, shoulder, or hand None   Difficulty Sleeping Mild difficulty   DASH Score 22.73 %  Long Term Clinic Goals - 04/22/15 1715    CC Long Term Goal  #1   Title Right shoulder active flexion to at least 165 degrees.   Time 4   Period Weeks   Status New   CC Long Term Goal  #2   Title Right shoulder active abduction to at least 180 degrees.   Time 4   Period Weeks   Status New   CC Long Term Goal  #3   Title Patient will be independent in HEP including strengthening for right shoulder, and in safe self-progression.   Time 4   Status New   CC Long Term Goal  #4   Title Pt. will report perception of at least 30% improvement in functional strength in right shoulder.   Time 4   Period Weeks   Status New            Plan - 04/22/15 1711    Clinical Impression Statement This is a very pleasant young woman s/p lumpectomy, chemo, and radiation for right breast cancer, now with limited right shoulder ROM and strength.  She is still getting herceptin and tamoxifen.  She appears motivated and should do well with therapy to improve these limitations.  We did discuss that radiation can continue to have effects for months beyond  the end of her course of treatment.  Her shoulder weakness is somewhat surprising, but could be just from disuse (it does not appear that radiation was targeted to axilla).  She is heavyset, but describes herself as athletic, and "not just fat."  The tremor she felt when lifting a gallon jug out of her  refrigerator was the final indicator for her that she needed to do some therapy.  She noticed these issues around November.   Pt will benefit from skilled therapeutic intervention in order to improve on the following deficits Decreased range of motion;Decreased strength;Impaired UE functional use   Rehab Potential Excellent   PT Frequency 2x / week   PT Duration 4 weeks   PT Treatment/Interventions Therapeutic exercise;Passive range of motion;Manual techniques;Patient/family education   PT Next Visit Plan Begin P/AA/AROM and strengthening for right shoulder; begin HEP instruction for same.   Consulted and Agree with Plan of Care Patient         Problem List Patient Active Problem List   Diagnosis Date Noted  . Obesity (BMI 35.0-39.9 without comorbidity) ([place]) 03/20/2015  . Leg edema, right 10/03/2014  . Genetic testing 07/10/2014  . Breast cancer of lower-inner quadrant of right female breast ([place]) 05/29/2014    , 04/22/2015, 5:30 PM  [place], [place], [address] Phone: [phone]   Fax:  [phone]  Name: MARBETH RAHMING MRN: [id number] Date of Birth: 23-Mar-1971   Serafina Royals, PT 04/22/2015 5:30 PM

## 2015-04-23 ENCOUNTER — Other Ambulatory Visit: Payer: Self-pay

## 2015-04-23 ENCOUNTER — Telehealth: Payer: Self-pay | Admitting: *Deleted

## 2015-04-23 ENCOUNTER — Encounter: Payer: Self-pay | Admitting: Hematology and Oncology

## 2015-04-23 ENCOUNTER — Ambulatory Visit: Payer: BLUE CROSS/BLUE SHIELD

## 2015-04-23 ENCOUNTER — Telehealth: Payer: Self-pay | Admitting: Gastroenterology

## 2015-04-23 DIAGNOSIS — K529 Noninfective gastroenteritis and colitis, unspecified: Secondary | ICD-10-CM

## 2015-04-23 DIAGNOSIS — M25611 Stiffness of right shoulder, not elsewhere classified: Secondary | ICD-10-CM

## 2015-04-23 DIAGNOSIS — R6889 Other general symptoms and signs: Secondary | ICD-10-CM

## 2015-04-23 DIAGNOSIS — M25511 Pain in right shoulder: Secondary | ICD-10-CM | POA: Diagnosis not present

## 2015-04-23 DIAGNOSIS — R531 Weakness: Secondary | ICD-10-CM

## 2015-04-23 MED ORDER — ELUXADOLINE 100 MG PO TABS: 1.0000 | ORAL_TABLET | Freq: Two times a day (BID) | ORAL | Status: DC

## 2015-04-23 NOTE — Patient Instructions (Addendum)
Cancer Rehab [id number] Strengthening: Resisted Internal Rotation   Hold tubing in left hand, elbow at side and forearm out. Rotate forearm in across body keeping elbow bent at 90 degrees. Repeat __10__ times per set. Do _1-2___ sets per session. Do _2___ sessions per day.  http://orth.exer.us/830   Copyright  VHI. All rights reserved.  Strengthening: Resisted External Rotation   Hold tubing in right hand, elbow at side and forearm across body. Rotate forearm out keeping elbow bent at 90 degrees. Repeat _10___ times per set. Do _1-2___ sets per session. Do __2__ sessions per day.  http://orth.exer.us/828   Copyright  VHI. All rights reserved.  Strengthening: Resisted Flexion   Hold tubing with left arm at side. Pull forward and up. Move shoulder through pain-free range of motion. Repeat _10___ times per set. Do _1-2___ sets per session. Do __2__ sessions per day.  http://orth.exer.us/824   Copyright  VHI. All rights reserved.  Strengthening: Resisted Extension   Hold tubing in right hand, arm forward. Pull arm back, elbow straight. Repeat _10___ times per set. Do _1-2___ sets per session. Do __2__ sessions per day.  http://orth.exer.us/832   Copyright  VHI. All rights reserved.   Flexibility: Corner Stretch    Standing in corner with one foot in front of other and hands just above shoulder level and feet _~12___ inches from corner, lean forward until a comfortable stretch is felt across chest. Hold _20___ seconds. Repeat __3-5__ times per set. Do _1___ sets per session. Do _2-3___ sessions per day.  http://orth.exer.us/343   Copyright  VHI. All rights reserved.

## 2015-04-23 NOTE — Therapy (Signed)
[place], [place], [address] Phone: [phone]   Fax:  [phone]  Physical Therapy Treatment  Patient Details  Name: Krystal Kelley MRN: [id number] Date of Birth: [birth year] Referring Provider: Nicholas Lose, MD  Encounter Date: 04/23/2015      PT End of Session - 04/23/15 0937    Visit Number 2   Number of Visits 9   Date for PT Re-Evaluation 05/22/15   PT Start Time 0851   PT Stop Time 0935   PT Time Calculation (min) 44 min   Activity Tolerance Patient tolerated treatment well   Behavior During Therapy [facility] for tasks assessed/performed      Past Medical History  Diagnosis Date  . Hypertension   . Breast cancer, right breast ([place])   . Radiation 10/30/14-12/17/14    right breast 50.4 gray    Past Surgical History  Procedure Laterality Date  . Bunion surgrery    . Carpel tunnel    . Breast biopsy  05/16/14  . Cholecystectomy  2015  . Portacath placement Left 06/18/2014    Procedure: INSERTION PORT-A-CATH;  Surgeon: Excell Seltzer, MD;  Location: [place];  Service: General;  Laterality: Left;    There were no vitals filed for this visit.  Visit Diagnosis:  Decreased ROM of right shoulder  Decreased strength  Impaired function of upper extremity      Subjective Assessment - 04/23/15 0852    Subjective Having trouble getting my preauthorization for this, I've been calling but they keep putting me on hold! No change with my Rt shoulder since yesterday.   Currently in Pain? No/denies               LYMPHEDEMA/ONCOLOGY QUESTIONNAIRE - 04/22/15 1527    Type   Cancer Type right breast   Surgeries   Lumpectomy Date 06/18/14   Treatment   Past Chemotherapy Treatment Yes   Past Radiation Treatment Yes   Current Hormone Treatment Yes   Drug Name tamoxifen   Lymphedema Assessments   Lymphedema Assessments Upper extremities   Right Upper Extremity  Lymphedema   10 cm Proximal to Olecranon Process 40 cm   Olecranon Process 29.2 cm   10 cm Proximal to Ulnar Styloid Process 27.7 cm   Just Proximal to Ulnar Styloid Process 19.2 cm   Across Hand at [employer] 21.7 cm   At [place] of 2nd Digit 6.3 cm   Left Upper Extremity Lymphedema   10 cm Proximal to Olecranon Process 41.7 cm   Olecranon Process 29.5 cm   10 cm Proximal to Ulnar Styloid Process 28.8 cm   Just Proximal to Ulnar Styloid Process 19.7 cm   Across Hand at [employer] 21.6 cm   At [place] of 2nd Digit 6.7 cm                  [facility] Adult PT Treatment/Exercise - 04/23/15 0001    Shoulder Exercises: Standing   External Rotation Strengthening;Right;10 reps;Theraband   Theraband Level (Shoulder External Rotation) Level 3 (Green)   Internal Rotation Strengthening;Right;10 reps;Theraband   Theraband Level (Shoulder Internal Rotation) Level 3 (Green)   Flexion Strengthening;Right;Theraband;15 reps   Theraband Level (Shoulder Flexion) Level 3 (Green)   Extension Strengthening;Right;10 reps;Theraband   Theraband Level (Shoulder Extension) Level 3 (Green)   Shoulder Exercises: Therapy Ball   Flexion 10 reps  Forward lean at end of stretch   ABduction 10 reps  Rt UE only  Shoulder Exercises: Stretch   Corner Stretch 3 reps;20 seconds   Manual Therapy   Myofascial Release To Rt axilla and chest during PROM diaganolly    Passive ROM In Supine to Rt shoulder into flexion, abduction, D2 and neural tension stretch all to pts tolerance                PT Education - 04/23/15 0918    Education provided Yes   Education Details [place] with green theraband and corner stretch   Person(s) Educated Patient   Methods Explanation;Demonstration;Handout;Verbal cues   Comprehension Verbalized understanding;Returned demonstration                [facility] Goals - 04/22/15 1715    CC Long Term Goal  #1   Title Right shoulder active flexion to at  least 165 degrees.   Time 4   Period Weeks   Status New   CC Long Term Goal  #2   Title Right shoulder active abduction to at least 180 degrees.   Time 4   Period Weeks   Status New   CC Long Term Goal  #3   Title Patient will be independent in HEP including strengthening for right shoulder, and in safe self-progression.   Time 4   Status New   CC Long Term Goal  #4   Title Pt. will report perception of at least 30% improvement in functional strength in right shoulder.   Time 4   Period Weeks   Status New            Plan - 04/23/15 1020    Clinical Impression Statement Pt did well with initial HP instruction today and tolerated stretching well. She is motivated to get better.   Pt will benefit from skilled therapeutic intervention in order to improve on the following deficits Decreased range of motion;Decreased strength;Impaired UE functional use   Rehab Potential Excellent   PT Frequency 2x / week   PT Duration 4 weeks   PT Treatment/Interventions Therapeutic exercise;Passive range of motion;Manual techniques;Patient/family education   PT Next Visit Plan Begin P/AA/AROM and strengthening for right shoulder; cont and review prn HEP instruction for same.   PT Home Exercise Plan see education section   Consulted and Agree with Plan of Care Patient        Problem List Patient Active Problem List   Diagnosis Date Noted  . Obesity (BMI 35.0-39.9 without comorbidity) ([place]) 03/20/2015  . Leg edema, right 10/03/2014  . Genetic testing 07/10/2014  . Breast cancer of lower-inner quadrant of right female breast ([place]) 05/29/2014    Otelia Limes, PTA 04/23/2015, 10:26 AM  [place], [place], [address] Phone: [phone]   Fax:  [phone]  Name: Krystal Kelley MRN: [id number] Date of Birth: 1971/03/10

## 2015-04-23 NOTE — Progress Notes (Signed)
Returned phone call to patient in reference to obtaining referrals for dr visit. Patient states she contacted her PCP office and was told we would have to contact them to obtain the referral if needed. Pt has not yet received insurance card and will not be available online for five days and by then she would receive one in the mail. Pt is correct and understands that the PCP would need to make the referral to see our dr and then authorization for her treatment would be obtained by [place]. She understands we also need the card as soon as it is avaialable. Advised pt when she calls back to the PCP office to ask for the office manager who may be able to expediite getting the referral done. Pt states she would do that.

## 2015-04-23 NOTE — Telephone Encounter (Signed)
Called pt and informed her the rx was sent to [facility] per her request. Advised her this is considered a controlled substance and if they will not accept over the phone rx's then she will need to come and pick up the rx. Pt understood.

## 2015-04-23 NOTE — Telephone Encounter (Signed)
Received call from pt stating that she has new insurance with [employer] & needs a referral from her PCP to see Dr Lindi Adie but PCP states we have to contact them first.  Discussed with Managed Care/ Shauna & was told that pt just needs to bring her insurance card with her at appt & they will take care of it then.   Post our conversation, talked with Darlena/managed Care & she states pt should get that info to managed care prior to appt & Marguarite Arbour will call pt to discuss.

## 2015-04-23 NOTE — Telephone Encounter (Signed)
Patient called and needs a refill on her Viberzi

## 2015-04-25 ENCOUNTER — Ambulatory Visit: Payer: BLUE CROSS/BLUE SHIELD | Admitting: Physical Therapy

## 2015-04-25 DIAGNOSIS — R531 Weakness: Secondary | ICD-10-CM

## 2015-04-25 DIAGNOSIS — R6889 Other general symptoms and signs: Secondary | ICD-10-CM

## 2015-04-25 DIAGNOSIS — M25511 Pain in right shoulder: Secondary | ICD-10-CM | POA: Diagnosis not present

## 2015-04-25 DIAGNOSIS — M25611 Stiffness of right shoulder, not elsewhere classified: Secondary | ICD-10-CM

## 2015-04-25 NOTE — Therapy (Signed)
[place], [place], [address] Phone: [phone]   Fax:  [phone]  Physical Therapy Treatment  Patient Details  Name: Krystal Kelley MRN: [id number] Date of Birth: 10/14/1970 Referring Provider: Nicholas Lose, MD  Encounter Date: 04/25/2015      PT End of Session - 04/25/15 1247    Visit Number 3   Number of Visits 9   Date for PT Re-Evaluation 05/22/15   PT Start Time 0938   PT Stop Time 1020   PT Time Calculation (min) 42 min   Activity Tolerance Patient tolerated treatment well   Behavior During Therapy [facility] for tasks assessed/performed      Past Medical History  Diagnosis Date  . Hypertension   . Breast cancer, right breast ([place])   . Radiation 10/30/14-12/17/14    right breast 50.4 gray    Past Surgical History  Procedure Laterality Date  . Bunion surgrery    . Carpel tunnel    . Breast biopsy  05/16/14  . Cholecystectomy  2015  . Portacath placement Left 06/18/2014    Procedure: INSERTION PORT-A-CATH;  Surgeon: Excell Seltzer, MD;  Location: [facility];  Service: General;  Laterality: Left;    There were no vitals filed for this visit.  Visit Diagnosis:  Decreased ROM of right shoulder  Decreased strength  Impaired function of upper extremity      Subjective Assessment - 04/25/15 0941    Subjective Fine.  Nothin' new.  It went well/okay with the exercises.   Currently in Pain? No/denies                         [facility] Adult PT Treatment/Exercise - 04/25/15 0001    Shoulder Exercises: Seated   Other Seated Exercises overhead press 3 lbs. x 10   Shoulder Exercises: Prone   Other Prone Exercises Is, Vs, and Ts x 10 each both shoulders actively   Shoulder Exercises: Sidelying   Other Sidelying Exercises in left sidelying, right UE diagonal up and out with 1 lb. weight x 10   Shoulder Exercises: Standing   External Rotation  Strengthening;Right;10 reps;Theraband   Theraband Level (Shoulder External Rotation) Level 3 (Green)   Internal Rotation Strengthening;Right;10 reps;Theraband   Theraband Level (Shoulder Internal Rotation) Level 3 (Green)   Flexion Strengthening;Right;Theraband;15 reps   Theraband Level (Shoulder Flexion) Level 3 (Green)   Extension Strengthening;Right;10 reps;Theraband   Theraband Level (Shoulder Extension) Level 3 (Green)   Other Standing Exercises wall pushups x 10   Other Standing Exercises 3-way arm raises 2 lbs. x 10 each way, then 3 lbs. x 10 eacxh  most difficulty with flexion   Shoulder Exercises: Therapy Ball   Flexion 10 reps  Forward lean at end of stretch   ABduction 10 reps  Rt UE only   Shoulder Exercises: Stretch   Corner Stretch 3 reps;20 seconds                        [facility] Goals - 04/22/15 1715    CC Long Term Goal  #1   Title Right shoulder active flexion to at least 165 degrees.   Time 4   Period Weeks   Status New   CC Long Term Goal  #2   Title Right shoulder active abduction to at least 180 degrees.   Time 4   Period Weeks   Status New  CC Long Term Goal  #3   Title Patient will be independent in HEP including strengthening for right shoulder, and in safe self-progression.   Time 4   Status New   CC Long Term Goal  #4   Title Pt. will report perception of at least 30% improvement in functional strength in right shoulder.   Time 4   Period Weeks   Status New            Plan - 04/25/15 1247    Clinical Impression Statement Patient did well today as we work to find the optimal level of strengthening exercises for her.  She did report feeling worked out at the end of her session, which involved some stretching and focused on strengthening.   Pt will benefit from skilled therapeutic intervention in order to improve on the following deficits Decreased range of motion;Decreased strength;Impaired UE functional use   Rehab  Potential Excellent   PT Frequency 2x / week   PT Duration 4 weeks   PT Treatment/Interventions Therapeutic exercise   PT Next Visit Plan P/AA/AROM and strengthening for right shoulder; cont and review prn HEP instruction for same.   Consulted and Agree with Plan of Care Patient        Problem List Patient Active Problem List   Diagnosis Date Noted  . Obesity (BMI 35.0-39.9 without comorbidity) ([place]) 03/20/2015  . Leg edema, right 10/03/2014  . Genetic testing 07/10/2014  . Breast cancer of lower-inner quadrant of right female breast ([place]) 05/29/2014    Shelsie Tijerino 04/25/2015, 12:50 PM  [place], [place], [address] Phone: [phone]   Fax:  [phone]  Name: Krystal Kelley MRN: [id number] Date of Birth: 04-Nov-1970    Serafina Royals, PT 04/25/2015 12:50 PM

## 2015-04-28 ENCOUNTER — Ambulatory Visit: Payer: BLUE CROSS/BLUE SHIELD

## 2015-04-28 ENCOUNTER — Ambulatory Visit (INDEPENDENT_AMBULATORY_CARE_PROVIDER_SITE_OTHER): Payer: BLUE CROSS/BLUE SHIELD | Admitting: Podiatry

## 2015-04-28 ENCOUNTER — Encounter: Payer: Self-pay | Admitting: Podiatry

## 2015-04-28 ENCOUNTER — Ambulatory Visit: Payer: 59 | Admitting: Podiatry

## 2015-04-28 VITALS — BP 135/84 | HR 89 | Resp 12

## 2015-04-28 DIAGNOSIS — G5792 Unspecified mononeuropathy of left lower limb: Secondary | ICD-10-CM | POA: Diagnosis not present

## 2015-04-28 NOTE — Progress Notes (Signed)
She presents today for follow-up of neuritis to her left heel. She has been doing very well for quite a while now however she has gained considerable amount of weight secondary to the tamoxifen that she is taking for her breast cancer. She continues her monthly dose of Herceptin until March. She's complaining of left heel pain recently.  Objective: Vital signs are stable she is alert and oriented 3 she has pain on palpation medial tubercle of the left heel. Pulses remain palpable left. No calf Pain.  Assessment: Neuritis left heel plantar fasciitis left heel.  Plan: I injected the left heel today with 1 mL of dehydrated alcohol and local anesthetic. Follow-up with her in 4-5 weeks.

## 2015-04-30 ENCOUNTER — Other Ambulatory Visit: Payer: Self-pay

## 2015-04-30 ENCOUNTER — Ambulatory Visit: Payer: BLUE CROSS/BLUE SHIELD | Admitting: Physical Therapy

## 2015-04-30 DIAGNOSIS — C50311 Malignant neoplasm of lower-inner quadrant of right female breast: Secondary | ICD-10-CM

## 2015-04-30 NOTE — Assessment & Plan Note (Signed)
Right breast lumpectomy 06/18/2014: Invasive ductal carcinoma 1.3 cm negative for LVI; DCIS 1 mm from margins, 1 sentinel node negative, grade 3 ER 95%, PR 77%, HER-2 amplified ratio 8.25, Ki-67 43% T1 cN0 M0 stage IA  Treatment summary S/P Adjuvant chemo with Abraxane Herceptin started 07/18/2014 completed 10/03/14 Status post adjuvant radiation completed 12/17/2014 Started tamoxifen 12/26/2014  Current treatment: 1. Continue maintenance Herceptin every 3 weeks until March 2017 2. Started antiestrogen therapy with tamoxifen 20 mg daily 12/26/2014  Tamoxifen toxicities:  1. Emotional changes especially crying spells and anxiety spells Denies any hot flashes. She actually does not want to talk about hot flashes.  Fatigue: I discussed with her about increasing her physical activity and losing weight.  Leg edema: It is better so we stopped Lasix today. Flu shot was given today. Return to clinic every 6 weeks for follow-up

## 2015-04-30 NOTE — Progress Notes (Signed)
Prior auth rcvd for [facility] [id number] effective 04/29/14-06/08/2015

## 2015-05-01 ENCOUNTER — Ambulatory Visit (HOSPITAL_BASED_OUTPATIENT_CLINIC_OR_DEPARTMENT_OTHER): Payer: BLUE CROSS/BLUE SHIELD | Admitting: Hematology and Oncology

## 2015-05-01 ENCOUNTER — Other Ambulatory Visit: Payer: Self-pay | Admitting: Hematology and Oncology

## 2015-05-01 ENCOUNTER — Other Ambulatory Visit (HOSPITAL_BASED_OUTPATIENT_CLINIC_OR_DEPARTMENT_OTHER): Payer: BLUE CROSS/BLUE SHIELD

## 2015-05-01 ENCOUNTER — Ambulatory Visit (HOSPITAL_BASED_OUTPATIENT_CLINIC_OR_DEPARTMENT_OTHER): Payer: BLUE CROSS/BLUE SHIELD

## 2015-05-01 ENCOUNTER — Telehealth: Payer: Self-pay | Admitting: Hematology and Oncology

## 2015-05-01 ENCOUNTER — Encounter: Payer: Self-pay | Admitting: Hematology and Oncology

## 2015-05-01 VITALS — BP 145/96 | HR 95 | Temp 98.8°F | Resp 19 | Wt 257.3 lb

## 2015-05-01 DIAGNOSIS — C50311 Malignant neoplasm of lower-inner quadrant of right female breast: Secondary | ICD-10-CM | POA: Diagnosis not present

## 2015-05-01 DIAGNOSIS — R5383 Other fatigue: Secondary | ICD-10-CM

## 2015-05-01 DIAGNOSIS — E669 Obesity, unspecified: Secondary | ICD-10-CM

## 2015-05-01 DIAGNOSIS — Z5112 Encounter for antineoplastic immunotherapy: Secondary | ICD-10-CM | POA: Diagnosis not present

## 2015-05-01 DIAGNOSIS — Z170 Estrogen receptor positive status [ER+]: Secondary | ICD-10-CM | POA: Diagnosis not present

## 2015-05-01 DIAGNOSIS — T451X5A Adverse effect of antineoplastic and immunosuppressive drugs, initial encounter: Secondary | ICD-10-CM

## 2015-05-01 DIAGNOSIS — R609 Edema, unspecified: Secondary | ICD-10-CM | POA: Diagnosis not present

## 2015-05-01 LAB — CBC WITH DIFFERENTIAL/PLATELET
BASO%: 0.5 % (ref 0.0–2.0)
Basophils Absolute: 0 10*3/uL (ref 0.0–0.1)
EOS%: 1.6 % (ref 0.0–7.0)
Eosinophils Absolute: 0.1 10e3/uL (ref 0.0–0.5)
HCT: 36.1 % (ref 34.8–46.6)
HGB: 12.3 g/dL (ref 11.6–15.9)
LYMPH%: 20.4 % (ref 14.0–49.7)
MCH: 31.1 pg (ref 25.1–34.0)
MCHC: 34.1 g/dL (ref 31.5–36.0)
MCV: 91.3 fL (ref 79.5–101.0)
MONO#: 0.6 10*3/uL (ref 0.1–0.9)
MONO%: 7.5 % (ref 0.0–14.0)
NEUT#: 5.9 10e3/uL (ref 1.5–6.5)
NEUT%: 70 % (ref 38.4–76.8)
Platelets: 303 10*3/uL (ref 145–400)
RBC: 3.96 10*6/uL (ref 3.70–5.45)
RDW: 13.6 % (ref 11.2–14.5)
WBC: 8.4 10e3/uL (ref 3.9–10.3)
lymph#: 1.7 10e3/uL (ref 0.9–3.3)

## 2015-05-01 LAB — COMPREHENSIVE METABOLIC PANEL
ALT: 25 U/L (ref 0–55)
Anion Gap: 11 mEq/L (ref 3–11)
CO2: 25 mEq/L (ref 22–29)
Calcium: 9.2 mg/dL (ref 8.4–10.4)
Chloride: 106 mEq/L (ref 98–109)
Creatinine: 1 mg/dL (ref 0.6–1.1)
Potassium: 3.7 mEq/L (ref 3.5–5.1)

## 2015-05-01 LAB — COMPREHENSIVE METABOLIC PANEL WITH GFR
AST: 27 U/L (ref 5–34)
Albumin: 3.3 g/dL — ABNORMAL LOW (ref 3.5–5.0)
Alkaline Phosphatase: 56 U/L (ref 40–150)
BUN: 12.6 mg/dL (ref 7.0–26.0)
EGFR: 80 ml/min/1.73 m2 — ABNORMAL LOW (ref >90)
Glucose: 101 mg/dL (ref 70–140)
Sodium: 142 meq/L (ref 136–145)
Total Bilirubin: <0.3 mg/dL (ref 0.20–1.20)
Total Protein: 7.5 g/dL (ref 6.4–8.3)

## 2015-05-01 MED ORDER — DIPHENHYDRAMINE HCL 25 MG PO CAPS
ORAL_CAPSULE | ORAL | Status: AC
  Filled 2015-05-01: qty 2

## 2015-05-01 MED ORDER — ACETAMINOPHEN 325 MG PO TABS
650.0000 mg | ORAL_TABLET | Freq: Once | ORAL | Status: AC
  Administered 2015-05-01: 650 mg via ORAL

## 2015-05-01 MED ORDER — ACETAMINOPHEN 325 MG PO TABS
ORAL_TABLET | ORAL | Status: AC
  Filled 2015-05-01: qty 2

## 2015-05-01 MED ORDER — HEPARIN SOD (PORK) LOCK FLUSH 100 UNIT/ML IV SOLN
500.0000 [IU] | Freq: Once | INTRAVENOUS | Status: AC | PRN
  Administered 2015-05-01: 500 [IU]
  Filled 2015-05-01: qty 5

## 2015-05-01 MED ORDER — SODIUM CHLORIDE 0.9 % IJ SOLN
10.0000 mL | INTRAMUSCULAR | Status: DC | PRN
  Administered 2015-05-01: 10 mL
  Filled 2015-05-01: qty 10

## 2015-05-01 MED ORDER — DIPHENHYDRAMINE HCL 25 MG PO CAPS
50.0000 mg | ORAL_CAPSULE | Freq: Once | ORAL | Status: AC
  Administered 2015-05-01: 50 mg via ORAL

## 2015-05-01 MED ORDER — TRASTUZUMAB CHEMO INJECTION 440 MG
6.0000 mg/kg | Freq: Once | INTRAVENOUS | Status: AC
  Administered 2015-05-01: 672 mg via INTRAVENOUS
  Filled 2015-05-01: qty 32

## 2015-05-01 MED ORDER — SODIUM CHLORIDE 0.9 % IV SOLN
Freq: Once | INTRAVENOUS | Status: AC
  Administered 2015-05-01: 12:00:00 via INTRAVENOUS

## 2015-05-01 NOTE — Addendum Note (Signed)
Addended by: Prentiss Bells on: 05/01/2015 02:08 PM   Modules accepted: Medications

## 2015-05-01 NOTE — Patient Instructions (Signed)
[facility] Discharge Instructions for Patients Receiving Chemotherapy  Today you received the following chemotherapy agents Herceptin  To help prevent nausea and vomiting after your treatment, we encourage you to take your nausea medication    If you develop nausea and vomiting that is not controlled by your nausea medication, call the clinic.   BELOW ARE SYMPTOMS THAT SHOULD BE REPORTED IMMEDIATELY:  *FEVER GREATER THAN 100.5 F  *CHILLS WITH OR WITHOUT FEVER  NAUSEA AND VOMITING THAT IS NOT CONTROLLED WITH YOUR NAUSEA MEDICATION  *UNUSUAL SHORTNESS OF BREATH  *UNUSUAL BRUISING OR BLEEDING  TENDERNESS IN MOUTH AND THROAT WITH OR WITHOUT PRESENCE OF ULCERS  *URINARY PROBLEMS  *BOWEL PROBLEMS  UNUSUAL RASH Items with * indicate a potential emergency and should be followed up as soon as possible.  Feel free to call the clinic you have any questions or concerns. The clinic phone number is [phone].  Please show the CHEMO ALERT CARD at check-in to the Emergency Department and triage nurse.   

## 2015-05-01 NOTE — Progress Notes (Signed)
Patient Care Team: Albina Billet, MD as PCP - General (Internal Medicine) Albina Billet, MD (Internal Medicine)  SUMMARY OF ONCOLOGIC HISTORY:   Breast cancer of lower-inner quadrant of right female breast ([place])   05/16/2014 Initial Diagnosis Ultrasound-guided biopsy: Invasive ductal carcinoma with DCIS, grade 1-2, ER/PR positive, Ki-67 43%, HER-2 positive   05/28/2014 Breast MRI Right breast: 1.4 x 1.2 x 1 cm oval enhancing mass lower inner quadrant   06/18/2014 Surgery Right breast lumpectomy: Invasive ductal carcinoma 1.3 cm negative for LVI; DCIS 1 mm from margins, 1 sentinel node negative, grade 3 ER 95%, PR 77%, HER-2 amplified ratio 8.25, Ki-67 43% T1 cN0 M0 stage IA   07/18/2014 - 10/03/2014 Chemotherapy Abraxane Herceptin weekly 12 followed by Herceptin maintenance every 3 weeks   11/01/2014 - 12/17/2014 Radiation Therapy Adjuvant radiation therapy   01/01/2015 -  Anti-estrogen oral therapy Tamoxifen 20 mg daily    CHIEF COMPLIANT: follow-up on tamoxifen and Herceptin  INTERVAL HISTORY: Krystal Kelley is a 45 year old with above-mentioned history of right breast cancer currently on Herceptin maintenance. She appears to be tolerating it extremely well. She has an echocardiogram scheduled for tomorrow. She is also on tamoxifen therapy and appears to be handling it fairly well. She is not emotionally depressed or tearful anymore. Today she complains of weight gain issues.  REVIEW OF SYSTEMS:   Constitutional: Denies fevers, chills or abnormal weight loss Eyes: Denies blurriness of vision Ears, nose, mouth, throat, and face: Denies mucositis or sore throat Respiratory: Denies cough, dyspnea or wheezes Cardiovascular: Denies palpitation, chest discomfort Gastrointestinal:  Denies nausea, heartburn or change in bowel habits Skin: Denies abnormal skin rashes Lymphatics: Denies new lymphadenopathy or easy bruising Neurological:Denies numbness, tingling or new weaknesses Behavioral/Psych:  Mood is stable, no new changes  Extremities: No lower extremity edema Breast: firmness and swelling around the right breast. All other systems were reviewed with the patient and are negative.  I have reviewed the past medical history, past surgical history, social history and family history with the patient and they are unchanged from previous note.  ALLERGIES:  has No Known Allergies.  MEDICATIONS:  Current Outpatient Prescriptions  Medication Sig Dispense Refill  . chlorpheniramine-HYDROcodone (TUSSIONEX) 10-8 MG/5ML LQCR Take 5 mLs by mouth every 12 (twelve) hours as needed for cough.   0  . Eluxadoline (VIBERZI) 100 MG TABS Take 1 tablet by mouth 2 (two) times daily. 60 tablet 5  . fluticasone (FLONASE) 50 MCG/ACT nasal spray SPRAY 2 SPRAYS IN EACH NOSTRILS  2  . furosemide (LASIX) 40 MG tablet TK 1 T PO QD PRN  0  . hyaluronate sodium (RADIAPLEXRX) GEL Apply 1 application topically 2 (two) times daily. Reported on 04/22/2015    . hydrochlorothiazide (HYDRODIURIL) 25 MG tablet Take 25 mg by mouth daily.    [name] lidocaine-prilocaine (EMLA) cream Apply 1 application topically as needed. 30 g 0  . RESTASIS 0.05 % ophthalmic emulsion INSTILL ONE DROP IN AFFECTED EYE 2 TIMES A DAY  4  . tamoxifen (NOLVADEX) 20 MG tablet Take 1 tablet (20 mg total) by mouth daily. 90 tablet 3  . valACYclovir (VALTREX) 500 MG tablet      No current facility-administered medications for this visit.    PHYSICAL EXAMINATION: ECOG PERFORMANCE STATUS: 1 - Symptomatic but completely ambulatory  Filed Vitals:   05/01/15 1126  BP: 145/96  Pulse: 95  Temp: 98.8 F (37.1 C)  Resp: 19   Filed Weights   05/01/15 1126  Weight:  257 lb 4.8 oz (116.711 kg)    GENERAL:alert, no distress and comfortable SKIN: skin color, texture, turgor are normal, no rashes or significant lesions EYES: normal, Conjunctiva are pink and non-injected, sclera clear OROPHARYNX:no exudate, no erythema and lips, buccal mucosa, and  tongue normal  NECK: supple, thyroid normal size, non-tender, without nodularity LYMPH:  no palpable lymphadenopathy in the cervical, axillary or inguinal LUNGS: clear to auscultation and percussion with normal breathing effort HEART: regular rate & rhythm and no murmurs and no lower extremity edema ABDOMEN:abdomen soft, non-tender and normal bowel sounds MUSCULOSKELETAL:no cyanosis of digits and no clubbing  NEURO: alert & oriented x 3 with fluent speech, no focal motor/sensory deficits EXTREMITIES: No lower extremity edema  LABORATORY DATA:  I have reviewed the data as listed   Chemistry      Component Value Date/Time   NA 141 04/10/2015 1115   NA 140 08/01/2014 0918   NA 135* 06/11/2013 0408   K 3.3* 04/10/2015 1115   K 4.0 08/01/2014 0918   K 3.2* 06/11/2013 0408   CL 107 08/01/2014 0918   CL 102 06/11/2013 0408   CO2 29 04/10/2015 1115   CO2 28 08/01/2014 0918   CO2 26 06/11/2013 0408   BUN 11.4 04/10/2015 1115   BUN 12 08/01/2014 0918   BUN 15 06/11/2013 0408   CREATININE 1.0 04/10/2015 1115   CREATININE 0.93 08/01/2014 0918   CREATININE 1.16 06/11/2013 0408      Component Value Date/Time   CALCIUM 8.8 04/10/2015 1115   CALCIUM 8.8 08/01/2014 0918   CALCIUM 8.7 06/11/2013 0408   ALKPHOS 59 04/10/2015 1115   ALKPHOS 54 08/01/2014 0918   ALKPHOS 75 06/12/2013 1541   AST 31 04/10/2015 1115   AST 41* 08/01/2014 0918   AST 137* 06/12/2013 1541   ALT 29 04/10/2015 1115   ALT 54* 08/01/2014 0918   ALT 192* 06/12/2013 1541   BILITOT 0.35 04/10/2015 1115   BILITOT 0.2* 08/01/2014 0918   BILITOT 0.5 06/12/2013 1541       Lab Results  Component Value Date   WBC 8.4 05/01/2015   HGB 12.3 05/01/2015   HCT 36.1 05/01/2015   MCV 91.3 05/01/2015   PLT 303 05/01/2015   NEUTROABS 5.9 05/01/2015   ASSESSMENT & PLAN:  Breast cancer of lower-inner quadrant of right female breast Right breast lumpectomy 06/18/2014: Invasive ductal carcinoma 1.3 cm negative for LVI;  DCIS 1 mm from margins, 1 sentinel node negative, grade 3 ER 95%, PR 77%, HER-2 amplified ratio 8.25, Ki-67 43% T1 cN0 M0 stage IA  Treatment summary S/P Adjuvant chemo with Abraxane Herceptin started 07/18/2014 completed 10/03/14 Status post adjuvant radiation completed 12/17/2014 Started tamoxifen 12/26/2014  Current treatment: 1. Continue maintenance Herceptin every 3 weeks until March 2017 2. Started antiestrogen therapy with tamoxifen 20 mg daily 12/26/2014  Tamoxifen toxicities:  1. Emotional changes especially crying spells and anxiety spells: Much improved 2. Vaginal yeast infections: Treated with over-the-counter creams with improvement. Denies any hot flashes.  Fatigue: I discussed with her about increasing her physical activity and losing weight.  Leg edema: result Obesity: I discussed with her extensively about exercising regularly and losing weight. Patient has not been exercising at all. On top of it she is not controlling her diet.  Return to clinic every 6 weeks for follow-up   No orders of the defined types were placed in this encounter.   The patient has a good understanding of the overall plan. she agrees with it.  she will call with any problems that may develop before the next visit here.   Rulon Eisenmenger, MD 05/01/2015

## 2015-05-01 NOTE — Telephone Encounter (Signed)
Appointments made and avs printed for patient °

## 2015-05-02 ENCOUNTER — Ambulatory Visit (HOSPITAL_COMMUNITY)
Admission: RE | Admit: 2015-05-02 | Discharge: 2015-05-02 | Disposition: A | Discharge status: Home / Self Care | Payer: BLUE CROSS/BLUE SHIELD | Source: Ambulatory Visit | Attending: Hematology and Oncology | Admitting: Hematology and Oncology

## 2015-05-02 ENCOUNTER — Ambulatory Visit (HOSPITAL_COMMUNITY): Payer: BLUE CROSS/BLUE SHIELD

## 2015-05-02 DIAGNOSIS — I517 Cardiomegaly: Secondary | ICD-10-CM | POA: Diagnosis not present

## 2015-05-02 DIAGNOSIS — I10 Essential (primary) hypertension: Secondary | ICD-10-CM | POA: Diagnosis not present

## 2015-05-02 DIAGNOSIS — Z09 Encounter for follow-up examination after completed treatment for conditions other than malignant neoplasm: Secondary | ICD-10-CM | POA: Diagnosis present

## 2015-05-02 DIAGNOSIS — T451X5A Adverse effect of antineoplastic and immunosuppressive drugs, initial encounter: Secondary | ICD-10-CM | POA: Diagnosis not present

## 2015-05-02 NOTE — Progress Notes (Signed)
  Echocardiogram 2D Echocardiogram has been performed.  Diamond Nickel 05/02/2015, 12:41 PM

## 2015-05-05 ENCOUNTER — Ambulatory Visit: Payer: BLUE CROSS/BLUE SHIELD | Admitting: Physical Therapy

## 2015-05-07 ENCOUNTER — Ambulatory Visit: Payer: BLUE CROSS/BLUE SHIELD | Admitting: Physical Therapy

## 2015-05-12 ENCOUNTER — Ambulatory Visit: Payer: BLUE CROSS/BLUE SHIELD | Admitting: Physical Therapy

## 2015-05-14 ENCOUNTER — Ambulatory Visit: Payer: BLUE CROSS/BLUE SHIELD | Admitting: Physical Therapy

## 2015-05-19 ENCOUNTER — Ambulatory Visit: Payer: BLUE CROSS/BLUE SHIELD | Admitting: Physical Therapy

## 2015-05-21 ENCOUNTER — Ambulatory Visit: Payer: BLUE CROSS/BLUE SHIELD | Attending: Hematology and Oncology | Admitting: Physical Therapy

## 2015-05-21 DIAGNOSIS — R6889 Other general symptoms and signs: Secondary | ICD-10-CM | POA: Insufficient documentation

## 2015-05-21 DIAGNOSIS — R531 Weakness: Secondary | ICD-10-CM

## 2015-05-21 DIAGNOSIS — M6281 Muscle weakness (generalized): Secondary | ICD-10-CM | POA: Diagnosis present

## 2015-05-21 DIAGNOSIS — M25511 Pain in right shoulder: Secondary | ICD-10-CM | POA: Diagnosis present

## 2015-05-21 DIAGNOSIS — M25611 Stiffness of right shoulder, not elsewhere classified: Secondary | ICD-10-CM

## 2015-05-21 NOTE — Therapy (Signed)
[place], [place], [address] Phone: [phone]   Fax:  [phone]  Physical Therapy Treatment  Patient Details  Name: Krystal Kelley MRN: [id number] Date of Birth: Aug 02, 1970 Referring Provider: Nicholas Lose, MD  Encounter Date: 05/21/2015      PT End of Session - 05/21/15 1231    Visit Number 4   Number of Visits 12   Date for PT Re-Evaluation 06/18/15   PT Start Time 9390   PT Stop Time 1106   PT Time Calculation (min) 43 min   Activity Tolerance Patient tolerated treatment well   Behavior During Therapy [facility] for tasks assessed/performed      Past Medical History  Diagnosis Date  . Hypertension   . Breast cancer, right breast ([place])   . Radiation 10/30/14-12/17/14    right breast 50.4 gray    Past Surgical History  Procedure Laterality Date  . Bunion surgrery    . Carpel tunnel    . Breast biopsy  05/16/14  . Cholecystectomy  2015  . Portacath placement Left 06/18/2014    Procedure: INSERTION PORT-A-CATH;  Surgeon: Excell Seltzer, MD;  Location: [place];  Service: General;  Laterality: Left;    There were no vitals filed for this visit.  Visit Diagnosis:  Decreased ROM of right shoulder - Plan: PT plan of care cert/re-cert  Decreased strength - Plan: PT plan of care cert/re-cert  Impaired function of upper extremity - Plan: PT plan of care cert/re-cert      Subjective Assessment - 05/21/15 1025    Subjective I've been at the house.  I haven't been able to get here, I didn't have gas.  I learned I could be getting help with that--will ask about this at the cancer center tomorrow.  I've been doing some homework exercises that I have.   Currently in Pain? No/denies  more weakness than pain            [facility] PT Assessment - 05/21/15 0001    AROM   Right Shoulder Flexion 159 Degrees   Right Shoulder ABduction 177 Degrees   Right Shoulder External Rotation 92  Degrees   Strength   Overall Strength Comments grip strength with dynamometer on 2nd setting:  right 45/35/32#, left 59/55/50#                     [facility] Adult PT Treatment/Exercise - 05/21/15 0001    Exercises   Other Exercises  wrist flexion and extension with 2 lb. weights x 10 each; velcro board for wrist supination and finger pinch x 5 each way; same with full hand grip velcro x 5 each way  could increase weight next time   Elbow Exercises   Bar Weights/Barbell (Elbow Flexion) 5 lbs  10 reps x 2   Elbow Extension Strengthening;Right;10 reps;Standing;Bar weights/barbell  tricep Banker Weights/Barbell (Elbow Extension) 5 lbs  could do more reps and/or weight   Shoulder Exercises: Supine   Other Supine Exercises chest press 3 lbs. x 10; 5 lbs. x 10   Shoulder Exercises: Seated   Other Seated Exercises overhead press 3 lbs. x 10   Shoulder Exercises: Standing   Horizontal ABduction Strengthening;Both;Weights;20 reps  bent over   Horizontal ABduction Weight (lbs) 2   Other Standing Exercises 3-way arm raises with 3 lbs. x 10 each way;    Shoulder Exercises: Therapy Ball   Flexion 10 reps  3 lb. velcro  weights on each wrist                        Long Term Clinic Goals - 05/21/15 1029    CC Long Term Goal  #1   Title Right shoulder active flexion to at least 165 degrees.   Status Partially Met   CC Long Term Goal  #2   Title Right shoulder active abduction to at least 180 degrees.   Status Partially Met   CC Long Term Goal  #3   Title Patient will be independent in HEP including strengthening for right shoulder, and in safe self-progression.   Status On-going   CC Long Term Goal  #4   Title Pt. will report perception of at least 30% improvement in functional strength in right shoulder.   Status On-going            Plan - 05/21/15 1232    Clinical Impression Statement Patient hasn't been able to attend therapy regularly because  she didn't have money to buy gas.  She has just learned she may be able to get assistance with this.  She does have weakness with right grip compared to left as measured with dynamometer today.  She has a Building services engineer and should do well with working on a transition in therapy toward a gym program.  She did make gains in shoulder ROM by doing HEP.   Pt will benefit from skilled therapeutic intervention in order to improve on the following deficits Decreased range of motion;Decreased strength;Impaired UE functional use   Rehab Potential Excellent   PT Frequency 2x / week   PT Duration 4 weeks   PT Treatment/Interventions Therapeutic exercise   PT Next Visit Plan Work with gym equipment for strengthening with goal of transition to her going to her gym.  Continue HEP and ROM.   Consulted and Agree with Plan of Care Patient        Problem List Patient Active Problem List   Diagnosis Date Noted  . Obesity (BMI 35.0-39.9 without comorbidity) ([place]) 03/20/2015  . Leg edema, right 10/03/2014  . Genetic testing 07/10/2014  . Breast cancer of lower-inner quadrant of right female breast ([place]) 05/29/2014    Amir Fick 05/21/2015, 12:37 PM  [place], [place], [address] Phone: [phone]   Fax:  [phone]  Name: MARLOWE CINQUEMANI MRN: [id number] Date of Birth: 1970/12/19    Serafina Royals, PT 05/21/2015 12:37 PM

## 2015-05-22 ENCOUNTER — Encounter: Payer: Self-pay | Admitting: Hematology and Oncology

## 2015-05-22 ENCOUNTER — Ambulatory Visit (HOSPITAL_BASED_OUTPATIENT_CLINIC_OR_DEPARTMENT_OTHER): Payer: BLUE CROSS/BLUE SHIELD

## 2015-05-22 ENCOUNTER — Other Ambulatory Visit (HOSPITAL_BASED_OUTPATIENT_CLINIC_OR_DEPARTMENT_OTHER): Payer: BLUE CROSS/BLUE SHIELD

## 2015-05-22 VITALS — BP 144/86 | HR 78 | Temp 98.7°F | Resp 20

## 2015-05-22 DIAGNOSIS — C50311 Malignant neoplasm of lower-inner quadrant of right female breast: Secondary | ICD-10-CM

## 2015-05-22 DIAGNOSIS — Z5112 Encounter for antineoplastic immunotherapy: Secondary | ICD-10-CM

## 2015-05-22 LAB — COMPREHENSIVE METABOLIC PANEL WITH GFR
Albumin: 3.5 g/dL (ref 3.5–5.0)
Alkaline Phosphatase: 62 U/L (ref 40–150)
Anion Gap: 11 meq/L (ref 3–11)
Calcium: 9.2 mg/dL (ref 8.4–10.4)
EGFR: 68 ml/min/1.73 m2 — ABNORMAL LOW (ref >90)
Potassium: 3.4 meq/L — ABNORMAL LOW (ref 3.5–5.1)
Total Bilirubin: 0.35 mg/dL (ref 0.20–1.20)
Total Protein: 7.9 g/dL (ref 6.4–8.3)

## 2015-05-22 LAB — CBC WITH DIFFERENTIAL/PLATELET
BASO%: 0.5 % (ref 0.0–2.0)
Basophils Absolute: 0 10e3/uL (ref 0.0–0.1)
EOS%: 1.8 % (ref 0.0–7.0)
Eosinophils Absolute: 0.1 10e3/uL (ref 0.0–0.5)
HCT: 37.9 % (ref 34.8–46.6)
HGB: 12.7 g/dL (ref 11.6–15.9)
LYMPH%: 22.7 % (ref 14.0–49.7)
MCH: 31 pg (ref 25.1–34.0)
MCHC: 33.7 g/dL (ref 31.5–36.0)
MCV: 92 fL (ref 79.5–101.0)
MONO#: 0.6 10e3/uL (ref 0.1–0.9)
MONO%: 7.6 % (ref 0.0–14.0)
NEUT#: 5.5 10e3/uL (ref 1.5–6.5)
NEUT%: 67.4 % (ref 38.4–76.8)
Platelets: 295 10e3/uL (ref 145–400)
RBC: 4.11 10*6/uL (ref 3.70–5.45)
RDW: 13.4 % (ref 11.2–14.5)
WBC: 8.1 10e3/uL (ref 3.9–10.3)
lymph#: 1.8 10*3/uL (ref 0.9–3.3)

## 2015-05-22 LAB — COMPREHENSIVE METABOLIC PANEL
ALT: 20 U/L (ref 0–55)
AST: 23 U/L (ref 5–34)
BUN: 13.3 mg/dL (ref 7.0–26.0)
CO2: 28 mEq/L (ref 22–29)
Chloride: 103 mEq/L (ref 98–109)
Creatinine: 1.1 mg/dL (ref 0.6–1.1)
Glucose: 97 mg/dl (ref 70–140)
Sodium: 142 mEq/L (ref 136–145)

## 2015-05-22 MED ORDER — ACETAMINOPHEN 325 MG PO TABS
ORAL_TABLET | ORAL | Status: AC
  Filled 2015-05-22: qty 2

## 2015-05-22 MED ORDER — HEPARIN SOD (PORK) LOCK FLUSH 100 UNIT/ML IV SOLN
500.0000 [IU] | Freq: Once | INTRAVENOUS | Status: AC | PRN
  Administered 2015-05-22: 500 [IU]
  Filled 2015-05-22: qty 5

## 2015-05-22 MED ORDER — SODIUM CHLORIDE 0.9 % IV SOLN
Freq: Once | INTRAVENOUS | Status: AC
  Administered 2015-05-22: 11:00:00 via INTRAVENOUS

## 2015-05-22 MED ORDER — DIPHENHYDRAMINE HCL 25 MG PO CAPS
ORAL_CAPSULE | ORAL | Status: AC
  Filled 2015-05-22: qty 1

## 2015-05-22 MED ORDER — SODIUM CHLORIDE 0.9 % IJ SOLN
10.0000 mL | INTRAMUSCULAR | Status: DC | PRN
  Administered 2015-05-22: 10 mL
  Filled 2015-05-22: qty 10

## 2015-05-22 MED ORDER — SODIUM CHLORIDE 0.9 % IV SOLN
6.0000 mg/kg | Freq: Once | INTRAVENOUS | Status: AC
  Administered 2015-05-22: 672 mg via INTRAVENOUS
  Filled 2015-05-22: qty 32

## 2015-05-22 MED ORDER — DIPHENHYDRAMINE HCL 25 MG PO CAPS
50.0000 mg | ORAL_CAPSULE | Freq: Once | ORAL | Status: AC
Start: 2015-05-22 — End: 2015-05-22
  Administered 2015-05-22: 50 mg via ORAL

## 2015-05-22 MED ORDER — ACETAMINOPHEN 325 MG PO TABS
650.0000 mg | ORAL_TABLET | Freq: Once | ORAL | Status: AC
Start: 2015-05-22 — End: 2015-05-22
  Administered 2015-05-22: 650 mg via ORAL

## 2015-05-22 NOTE — Progress Notes (Signed)
Enrolled patient in [place] assistance for Herceptin. Pt approved.

## 2015-05-22 NOTE — Progress Notes (Signed)
Patient approved for Herceptin through [facility] from 05/22/15-05/20/16 for $25,000. Emailed copy of approval letter and copay card to Emily/Bobbi in billing.

## 2015-05-22 NOTE — Progress Notes (Signed)
Pt stated she never met w/ a financial advocate when she became a pt and she needs assistance.  I informed her of the [place] and went over what it will and will not cover.  She will email me her latest bank statement to see if she qualifies.  We also discussed ACS and [employer] foundations.  She completed the ACS application which I faxed today.  She will complete the Go [facility] Go application and return it with the needed documents.  Once received either Shauna or myself will email it to them for her.  She has Shauna's card for any questions or concerns she may have.

## 2015-05-22 NOTE — Patient Instructions (Signed)
[facility] Discharge Instructions for Patients Receiving Chemotherapy  Today you received the following chemotherapy agents:  Herceptin  To help prevent nausea and vomiting after your treatment, we encourage you to take your nausea medication as prescribed.   If you develop nausea and vomiting that is not controlled by your nausea medication, call the clinic.   BELOW ARE SYMPTOMS THAT SHOULD BE REPORTED IMMEDIATELY:  *FEVER GREATER THAN 100.5 F  *CHILLS WITH OR WITHOUT FEVER  NAUSEA AND VOMITING THAT IS NOT CONTROLLED WITH YOUR NAUSEA MEDICATION  *UNUSUAL SHORTNESS OF BREATH  *UNUSUAL BRUISING OR BLEEDING  TENDERNESS IN MOUTH AND THROAT WITH OR WITHOUT PRESENCE OF ULCERS  *URINARY PROBLEMS  *BOWEL PROBLEMS  UNUSUAL RASH Items with * indicate a potential emergency and should be followed up as soon as possible.  Feel free to call the clinic you have any questions or concerns. The clinic phone number is [phone].  Please show the CHEMO ALERT CARD at check-in to the Emergency Department and triage nurse.   

## 2015-05-27 ENCOUNTER — Ambulatory Visit: Payer: BLUE CROSS/BLUE SHIELD

## 2015-05-28 ENCOUNTER — Ambulatory Visit: Payer: BLUE CROSS/BLUE SHIELD | Admitting: Physical Therapy

## 2015-06-02 ENCOUNTER — Ambulatory Visit: Payer: BLUE CROSS/BLUE SHIELD | Admitting: Physical Therapy

## 2015-06-04 ENCOUNTER — Encounter: Payer: Self-pay | Admitting: Podiatry

## 2015-06-04 ENCOUNTER — Ambulatory Visit (INDEPENDENT_AMBULATORY_CARE_PROVIDER_SITE_OTHER): Payer: BLUE CROSS/BLUE SHIELD | Admitting: Podiatry

## 2015-06-04 ENCOUNTER — Ambulatory Visit: Payer: BLUE CROSS/BLUE SHIELD | Admitting: Physical Therapy

## 2015-06-04 VITALS — BP 123/66 | HR 88 | Resp 16

## 2015-06-04 DIAGNOSIS — M7662 Achilles tendinitis, left leg: Secondary | ICD-10-CM | POA: Diagnosis not present

## 2015-06-04 DIAGNOSIS — R6889 Other general symptoms and signs: Secondary | ICD-10-CM

## 2015-06-04 DIAGNOSIS — M25611 Stiffness of right shoulder, not elsewhere classified: Secondary | ICD-10-CM

## 2015-06-04 DIAGNOSIS — G5792 Unspecified mononeuropathy of left lower limb: Secondary | ICD-10-CM

## 2015-06-04 DIAGNOSIS — M25511 Pain in right shoulder: Secondary | ICD-10-CM | POA: Diagnosis not present

## 2015-06-04 DIAGNOSIS — R531 Weakness: Secondary | ICD-10-CM

## 2015-06-04 DIAGNOSIS — M722 Plantar fascial fibromatosis: Secondary | ICD-10-CM | POA: Diagnosis not present

## 2015-06-04 NOTE — Progress Notes (Signed)
She presents today complaining of not heel pain but posterior pain along the Achilles. She states this just been there for the past few days has become more painful as time has passed. She states that she's recently been diagnosed with bronchitis is currently taking Levaquin. No injuries were noted.  Objective: Vital signs are stable she is alert and oriented 3. Pulses are palpable. Neurologic sensorium is intact. She has tenderness on palpation of the Achilles tendon just above the watershed area. The pain is on the anterior surface of the Achilles rather than the posterior. There does not appear to be a defect.  Assessment Achilles tendinitis.  Plan: Inject the anterior fat pad of the Achilles just anterior to the tendon itself with 2 mg of dexamethasone. She has contralateral hip pain or I would have considered placing her in a cam boot. I will follow-up with her in 1 month she will call sooner if needed but she will also try ice and resting the foot.

## 2015-06-04 NOTE — Therapy (Signed)
[place], [place], [address] Phone: [phone]   Fax:  [phone]  Physical Therapy Treatment  Patient Details  Name: Krystal Kelley MRN: [id number] Date of Birth: 1971-01-07 Referring Provider: Nicholas Lose, MD  Encounter Date: 06/04/2015      PT End of Session - 06/04/15 1224    Visit Number 5   Number of Visits 12   Date for PT Re-Evaluation 06/18/15   PT Start Time 0900  arrived 15 minutes late   PT Stop Time 0932   PT Time Calculation (min) 32 min   Activity Tolerance Patient tolerated treatment well   Behavior During Therapy [facility] for tasks assessed/performed      Past Medical History  Diagnosis Date  . Hypertension   . Breast cancer, right breast ([place])   . Radiation 10/30/14-12/17/14    right breast 50.4 gray    Past Surgical History  Procedure Laterality Date  . Bunion surgrery    . Carpel tunnel    . Breast biopsy  05/16/14  . Cholecystectomy  2015  . Portacath placement Left 06/18/2014    Procedure: INSERTION PORT-A-CATH;  Surgeon: Excell Seltzer, MD;  Location: [facility];  Service: General;  Laterality: Left;    There were no vitals filed for this visit.  Visit Diagnosis:  Decreased ROM of right shoulder  Decreased strength  Impaired function of upper extremity      Subjective Assessment - 06/04/15 0902    Subjective Was sick with a sinus infection last week, that's why I didn't come.  It's gotten better though.  Was sore from coughing a lot.  Is tired.     Currently in Pain? No/denies  just a little stiffness                         [facility] Adult PT Treatment/Exercise - 06/04/15 0001    Elbow Exercises   Bar Weights/Barbell (Elbow Flexion) 5 lbs  10 reps x 2   Elbow Extension Strengthening;Right;Standing;Bar weights/barbell;20 reps  tricep Banker Weights/Barbell (Elbow Extension) 5 lbs   Wrist Flexion  Strengthening;Right;20 reps;Seated;Bar weights/barbell  5 lbs.   Wrist Extension Strengthening;Right;10 reps;Seated;Bar weights/barbell   Bar Weights/Barbell (Wrist Extension) 3 lbs  then 5 lbs.   Knee/Hip Exercises: Standing   Functional Squat 1 set;10 reps  in front of chair   Shoulder Exercises: Seated   Other Seated Exercises overhead press 3 lbs. x 10   Shoulder Exercises: Standing   Horizontal ABduction Strengthening;Both;10 reps;Weights   Horizontal ABduction Weight (lbs) 3   Other Standing Exercises finger ladder for right abduction with 3 lb. weights x 5   Other Standing Exercises 3-way arm raises with 3 lbs. x 10 each way;    Shoulder Exercises: Therapy Ball   Flexion 10 reps  3 lb. velcro weights on each wrist                        Long Term Clinic Goals - 06/04/15 1230    CC Long Term Goal  #1   Title Right shoulder active flexion to at least 165 degrees.   Status Partially Met   CC Long Term Goal  #2   Title Right shoulder active abduction to at least 180 degrees.   Status Partially Met   CC Long Term Goal  #3   Title Patient will be independent in HEP including strengthening for  right shoulder, and in safe self-progression.   Status On-going   CC Long Term Goal  #4   Title Pt. will report perception of at least 30% improvement in functional strength in right shoulder.   Status On-going            Plan - 06/04/15 1225    Clinical Impression Statement Patient returned today after missing last week due to illness.  She was able to increase weight or reps on some exercises, though reported some fatigue related to recovering from illness.  We did not work with larger gym equipment today but still plan to work toward transition to a gym exercise program.   Pt will benefit from skilled therapeutic intervention in order to improve on the following deficits Decreased range of motion;Decreased strength;Impaired UE functional use   Rehab Potential  Excellent   PT Frequency 2x / week   PT Duration 4 weeks   PT Treatment/Interventions Therapeutic exercise   PT Next Visit Plan Work with gym equipment for strengthening with goal of transition to her going to her gym.  Continue HEP and ROM.   PT Home Exercise Plan continue with ROM as instructed earlier   Consulted and Agree with Plan of Care Patient        Problem List Patient Active Problem List   Diagnosis Date Noted  . Obesity (BMI 35.0-39.9 without comorbidity) ([place]) 03/20/2015  . Leg edema, right 10/03/2014  . Genetic testing 07/10/2014  . Breast cancer of lower-inner quadrant of right female breast ([place]) 05/29/2014    Calloway Andrus 06/04/2015, 12:33 PM  [place], [place], [address] Phone: [phone]   Fax:  [phone]  Name: Krystal Kelley MRN: [id number] Date of Birth: April 14, 1971    Serafina Royals, PT 06/04/2015 12:33 PM

## 2015-06-06 ENCOUNTER — Telehealth: Payer: Self-pay | Admitting: *Deleted

## 2015-06-06 NOTE — Telephone Encounter (Signed)
"  I'm having a problem with my right arm.  This is the side I had lymph nodes removed in March 2016.  It also hurts when I lie on my right hip.  I have received Physical therapy but no relief.  These problems started October 2016 after I started Tamoxifen in September.  I have decreased range of motion, move slowly, weak, sore, tender.  I can't move like I normally do for months now."   Return number [phone]. Denies any injury, lifting, pushing or pulling because she can't do these.  Arm shakes trying to pick up a gallon jug.

## 2015-06-09 ENCOUNTER — Ambulatory Visit: Payer: BLUE CROSS/BLUE SHIELD | Admitting: Physical Therapy

## 2015-06-09 NOTE — Telephone Encounter (Signed)
Pt c/o joint pain - no swelling, change color or temp.  Pt would like to be seen.  Appt made for Wed 2/22 11:15.  Pt voiced understanding.

## 2015-06-10 ENCOUNTER — Other Ambulatory Visit: Payer: Self-pay | Admitting: Hematology and Oncology

## 2015-06-10 NOTE — Assessment & Plan Note (Signed)
Right breast lumpectomy 06/18/2014: Invasive ductal carcinoma 1.3 cm negative for LVI; DCIS 1 mm from margins, 1 sentinel node negative, grade 3 ER 95%, PR 77%, HER-2 amplified ratio 8.25, Ki-67 43% T1 cN0 M0 stage IA  Treatment summary S/P Adjuvant chemo with Abraxane Herceptin started 07/18/2014 completed 10/03/14 Status post adjuvant radiation completed 12/17/2014 Started tamoxifen 12/26/2014  Current treatment: 1. Continue maintenance Herceptin every 3 weeks until March 2017 2. Started antiestrogen therapy with tamoxifen 20 mg daily 12/26/2014  Tamoxifen toxicities:  1. Emotional changes especially crying spells and anxiety spells: Much improved 2. Vaginal yeast infections: Treated with over-the-counter creams with improvement. Denies any hot flashes.  Fatigue: I discussed with her about increasing her physical activity and losing weight.  Leg edema: result Obesity: I discussed with her extensively about exercising regularly and losing weight. Patient has not been exercising at all. On top of it she is not controlling her diet.  Return to clinic in 3 months

## 2015-06-11 ENCOUNTER — Ambulatory Visit: Payer: BLUE CROSS/BLUE SHIELD | Admitting: Physical Therapy

## 2015-06-11 ENCOUNTER — Ambulatory Visit (HOSPITAL_BASED_OUTPATIENT_CLINIC_OR_DEPARTMENT_OTHER): Payer: BLUE CROSS/BLUE SHIELD | Admitting: Hematology and Oncology

## 2015-06-11 ENCOUNTER — Encounter: Payer: Self-pay | Admitting: Hematology and Oncology

## 2015-06-11 VITALS — BP 153/80 | HR 88 | Temp 98.0°F | Resp 20 | Wt 259.7 lb

## 2015-06-11 DIAGNOSIS — C50311 Malignant neoplasm of lower-inner quadrant of right female breast: Secondary | ICD-10-CM | POA: Diagnosis not present

## 2015-06-11 DIAGNOSIS — M25551 Pain in right hip: Secondary | ICD-10-CM

## 2015-06-11 DIAGNOSIS — R531 Weakness: Secondary | ICD-10-CM

## 2015-06-11 DIAGNOSIS — R6889 Other general symptoms and signs: Secondary | ICD-10-CM

## 2015-06-11 DIAGNOSIS — R5383 Other fatigue: Secondary | ICD-10-CM

## 2015-06-11 DIAGNOSIS — M79601 Pain in right arm: Secondary | ICD-10-CM

## 2015-06-11 DIAGNOSIS — Z170 Estrogen receptor positive status [ER+]: Secondary | ICD-10-CM

## 2015-06-11 DIAGNOSIS — M545 Low back pain: Secondary | ICD-10-CM

## 2015-06-11 DIAGNOSIS — M25611 Stiffness of right shoulder, not elsewhere classified: Secondary | ICD-10-CM

## 2015-06-11 DIAGNOSIS — M25511 Pain in right shoulder: Secondary | ICD-10-CM | POA: Diagnosis not present

## 2015-06-11 DIAGNOSIS — R600 Localized edema: Secondary | ICD-10-CM

## 2015-06-11 NOTE — Progress Notes (Signed)
Patient Care Team: Albina Billet, MD as PCP - General (Internal Medicine) Albina Billet, MD (Internal Medicine)  SUMMARY OF ONCOLOGIC HISTORY:   Breast cancer of lower-inner quadrant of right female breast ([place])   05/16/2014 Initial Diagnosis Ultrasound-guided biopsy: Invasive ductal carcinoma with DCIS, grade 1-2, ER/PR positive, Ki-67 43%, HER-2 positive   05/28/2014 Breast MRI Right breast: 1.4 x 1.2 x 1 cm oval enhancing mass lower inner quadrant   06/18/2014 Surgery Right breast lumpectomy: Invasive ductal carcinoma 1.3 cm negative for LVI; DCIS 1 mm from margins, 1 sentinel node negative, grade 3 ER 95%, PR 77%, HER-2 amplified ratio 8.25, Ki-67 43% T1 cN0 M0 stage IA   07/18/2014 - 10/03/2014 Chemotherapy Abraxane Herceptin weekly 12 followed by Herceptin maintenance every 3 weeks   11/01/2014 - 12/17/2014 Radiation Therapy Adjuvant radiation therapy   01/01/2015 -  Anti-estrogen oral therapy Tamoxifen 20 mg daily    CHIEF COMPLIANT: Follow-up on Herceptin and tamoxifen  INTERVAL HISTORY: Krystal Kelley is a 45 year old with above-mentioned history right breast cancer currently on adjuvant Herceptin treatment. She complains of severe pain in the right shoulder and right arm. She is undergoing physical therapy to the right side. She reports intense cramping pain and discomfort in the muscles of the arm she also has low back pain as well as right hip pain. All of these painful symptoms started after he started her on tamoxifen therapy.  REVIEW OF SYSTEMS:   Constitutional: Denies fevers, chills or abnormal weight loss Eyes: Denies blurriness of vision Ears, nose, mouth, throat, and face: Denies mucositis or sore throat Respiratory: Denies cough, dyspnea or wheezes Cardiovascular: Denies palpitation, chest discomfort Gastrointestinal:  Denies nausea, heartburn or change in bowel habits Skin: Denies abnormal skin rashes Lymphatics: Denies new lymphadenopathy or easy  bruising Neurological:Denies numbness, tingling or new weaknesses Behavioral/Psych: Mood is stable, no new changes  Extremities: No lower extremity edema Breast:  denies any pain or lumps or nodules in either breasts All other systems were reviewed with the patient and are negative.  I have reviewed the past medical history, past surgical history, social history and family history with the patient and they are unchanged from previous note.  ALLERGIES:  has No Known Allergies.  MEDICATIONS:  Current Outpatient Prescriptions  Medication Sig Dispense Refill  . chlorpheniramine-HYDROcodone (TUSSIONEX) 10-8 MG/5ML LQCR Take 5 mLs by mouth every 12 (twelve) hours as needed for cough.   0  . Eluxadoline (VIBERZI) 100 MG TABS Take 1 tablet by mouth 2 (two) times daily. 60 tablet 5  . fluticasone (FLONASE) 50 MCG/ACT nasal spray SPRAY 2 SPRAYS IN EACH NOSTRILS  2  . furosemide (LASIX) 40 MG tablet TK 1 T PO QD PRN  0  . hyaluronate sodium (RADIAPLEXRX) GEL Apply 1 application topically 2 (two) times daily. Reported on 04/22/2015    . hydrochlorothiazide (HYDRODIURIL) 25 MG tablet Take 25 mg by mouth daily.    [name] lidocaine-prilocaine (EMLA) cream Apply 1 application topically as needed. 30 g 0  . RESTASIS 0.05 % ophthalmic emulsion INSTILL ONE DROP IN AFFECTED EYE 2 TIMES A DAY  4  . tamoxifen (NOLVADEX) 20 MG tablet Take 1 tablet (20 mg total) by mouth daily. 90 tablet 3  . valACYclovir (VALTREX) 500 MG tablet      No current facility-administered medications for this visit.    PHYSICAL EXAMINATION: ECOG PERFORMANCE STATUS: 1 - Symptomatic but completely ambulatory  Filed Vitals:   06/11/15 1139  BP: 153/80  Pulse: 88  Temp: 98 F (36.7 C)  Resp: 20   Filed Weights   06/11/15 1139  Weight: 259 lb 11.2 oz (117.799 kg)    GENERAL:alert, no distress and comfortable SKIN: skin color, texture, turgor are normal, no rashes or significant lesions EYES: normal, Conjunctiva are pink and  non-injected, sclera clear OROPHARYNX:no exudate, no erythema and lips, buccal mucosa, and tongue normal  NECK: supple, thyroid normal size, non-tender, without nodularity LYMPH:  no palpable lymphadenopathy in the cervical, axillary or inguinal LUNGS: clear to auscultation and percussion with normal breathing effort HEART: regular rate & rhythm and no murmurs and no lower extremity edema ABDOMEN:abdomen soft, non-tender and normal bowel sounds MUSCULOSKELETAL:no cyanosis of digits and no clubbing  NEURO: alert & oriented x 3 with fluent speech, no focal motor/sensory deficits EXTREMITIES: No lower extremity edema  LABORATORY DATA:  I have reviewed the data as listed   Chemistry      Component Value Date/Time   NA 142 05/22/2015 1006   NA 140 08/01/2014 0918   NA 135* 06/11/2013 0408   K 3.4* 05/22/2015 1006   K 4.0 08/01/2014 0918   K 3.2* 06/11/2013 0408   CL 107 08/01/2014 0918   CL 102 06/11/2013 0408   CO2 28 05/22/2015 1006   CO2 28 08/01/2014 0918   CO2 26 06/11/2013 0408   BUN 13.3 05/22/2015 1006   BUN 12 08/01/2014 0918   BUN 15 06/11/2013 0408   CREATININE 1.1 05/22/2015 1006   CREATININE 0.93 08/01/2014 0918   CREATININE 1.16 06/11/2013 0408      Component Value Date/Time   CALCIUM 9.2 05/22/2015 1006   CALCIUM 8.8 08/01/2014 0918   CALCIUM 8.7 06/11/2013 0408   ALKPHOS 62 05/22/2015 1006   ALKPHOS 54 08/01/2014 0918   ALKPHOS 75 06/12/2013 1541   AST 23 05/22/2015 1006   AST 41* 08/01/2014 0918   AST 137* 06/12/2013 1541   ALT 20 05/22/2015 1006   ALT 54* 08/01/2014 0918   ALT 192* 06/12/2013 1541   BILITOT 0.35 05/22/2015 1006   BILITOT 0.2* 08/01/2014 0918   BILITOT 0.5 06/12/2013 1541       Lab Results  Component Value Date   WBC 8.1 05/22/2015   HGB 12.7 05/22/2015   HCT 37.9 05/22/2015   MCV 92.0 05/22/2015   PLT 295 05/22/2015   NEUTROABS 5.5 05/22/2015   ASSESSMENT & PLAN:  Breast cancer of lower-inner quadrant of right female  breast Right breast lumpectomy 06/18/2014: Invasive ductal carcinoma 1.3 cm negative for LVI; DCIS 1 mm from margins, 1 sentinel node negative, grade 3 ER 95%, PR 77%, HER-2 amplified ratio 8.25, Ki-67 43% T1 cN0 M0 stage IA  Treatment summary S/P Adjuvant chemo with Abraxane Herceptin started 07/18/2014 completed 10/03/14 Status post adjuvant radiation completed 12/17/2014 Started tamoxifen 12/26/2014  Current treatment: 1. Continue maintenance Herceptin every 3 weeks until March 2017 2. Started antiestrogen therapy with tamoxifen 20 mg daily 12/26/2014  Tamoxifen toxicities:  1. Emotional changes especially crying spells and anxiety spells: Much improved 2. Vaginal yeast infections: Treated with over-the-counter creams with improvement. Denies any hot flashes.  Fatigue: I discussed with her about increasing her physical activity and losing weight.  Leg edema: result Obesity: Patient is exercising more often. I instructed her to join the living well class to help her lose weight.  Return to clinic in 3 months   No orders of the defined types were placed in this encounter.   The patient has a good understanding of  the overall plan. she agrees with it. she will call with any problems that may develop before the next visit here.   Rulon Eisenmenger, MD 06/11/2015

## 2015-06-11 NOTE — Therapy (Signed)
[place], [place], [address] Phone: [phone]   Fax:  [phone]  Physical Therapy Treatment  Patient Details  Name: ARIONNE PHARRIS MRN: [id number] Date of Birth: 07/19/1970 Referring Provider: Nicholas Lose, MD  Encounter Date: 06/11/2015      PT End of Session - 06/11/15 2053    Visit Number 6   Number of Visits 12   Date for PT Re-Evaluation 06/18/15   PT Start Time 1028  patient late   PT Stop Time 1103   PT Time Calculation (min) 35 min   Activity Tolerance Patient tolerated treatment well   Behavior During Therapy [facility] for tasks assessed/performed      Past Medical History  Diagnosis Date  . Hypertension   . Breast cancer, right breast ([place])   . Radiation 10/30/14-12/17/14    right breast 50.4 gray    Past Surgical History  Procedure Laterality Date  . Bunion surgrery    . Carpel tunnel    . Breast biopsy  05/16/14  . Cholecystectomy  2015  . Portacath placement Left 06/18/2014    Procedure: INSERTION PORT-A-CATH;  Surgeon: Excell Seltzer, MD;  Location: [place];  Service: General;  Laterality: Left;    There were no vitals filed for this visit.  Visit Diagnosis:  Decreased ROM of right shoulder  Decreased strength  Impaired function of upper extremity      Subjective Assessment - 06/11/15 1028    Subjective Having more tingling in left hand; has had carpal tunnel surgery for right before, and she feels like she is ready to have the left one done.   Currently in Pain? No/denies                         [facility] Adult PT Treatment/Exercise - 06/11/15 0001    Elbow Exercises   Elbow Flexion Strengthening;Both;Standing;10 reps  15 # on machine   Elbow Extension Strengthening;10 reps;Standing;Other (comment)  on machine   Knee/Hip Exercises: Machines for Strengthening   Other Machine Batca leg press 75# x 10, then 95# x 10   Shoulder  Exercises: Seated   Other Seated Exercises lat pulldown machine 25# x 10x2; pect flies on machine, 15# x 10 x 2   Other Seated Exercises seated row on machine, 25# x 10 x 2; seated forward chest press 15# x 10    Shoulder Exercises: Standing   Other Standing Exercises upright row on machine, both arms, 10 # x 10   Other Standing Exercises 3-way arm raises with 1 lbs. x 10 each way;   couldn't do heavier today after doing other exx.; shlds. pop                        [place] - 06/11/15 2055    CC Long Term Goal  #3   Title Patient will be independent in HEP including strengthening for right shoulder, and in safe self-progression.   Status On-going   CC Long Term Goal  #4   Title Pt. will report perception of at least 30% improvement in functional strength in right shoulder.   Status On-going            Plan - 06/11/15 2053    Clinical Impression Statement Focus today was on transition to a gym program.  Weight equipment was used and we were able to find a good level of challenge  for strengthening her arms.  Patient arrived late so full reassessment was not done today.   Pt will benefit from skilled therapeutic intervention in order to improve on the following deficits Decreased range of motion;Decreased strength;Impaired UE functional use   Rehab Potential Excellent   PT Frequency 2x / week   PT Duration 4 weeks   PT Treatment/Interventions Therapeutic exercise   PT Next Visit Plan Reassess.  Work with gym equipment for strengthening with goal of transition to her going to her gym.  Continue HEP and ROM.   Consulted and Agree with Plan of Care Patient        Problem List Patient Active Problem List   Diagnosis Date Noted  . Obesity (BMI 35.0-39.9 without comorbidity) ([place]) 03/20/2015  . Leg edema, right 10/03/2014  . Genetic testing 07/10/2014  . Breast cancer of lower-inner quadrant of right female breast ([place]) 05/29/2014     Novalie Leamy 06/11/2015, 8:57 PM  [place], [place], [address] Phone: [phone]   Fax:  [phone]  Name: KYNZI BUTCHER MRN: [id number] Date of Birth: 1970/05/01    Serafina Royals, PT 06/11/2015 8:57 PM

## 2015-06-12 ENCOUNTER — Ambulatory Visit (HOSPITAL_BASED_OUTPATIENT_CLINIC_OR_DEPARTMENT_OTHER): Payer: BLUE CROSS/BLUE SHIELD

## 2015-06-12 ENCOUNTER — Other Ambulatory Visit (HOSPITAL_BASED_OUTPATIENT_CLINIC_OR_DEPARTMENT_OTHER): Payer: BLUE CROSS/BLUE SHIELD

## 2015-06-12 VITALS — BP 153/90 | HR 82 | Temp 98.7°F | Resp 16

## 2015-06-12 DIAGNOSIS — C50311 Malignant neoplasm of lower-inner quadrant of right female breast: Secondary | ICD-10-CM

## 2015-06-12 DIAGNOSIS — Z5112 Encounter for antineoplastic immunotherapy: Secondary | ICD-10-CM

## 2015-06-12 LAB — COMPREHENSIVE METABOLIC PANEL
ALT: 26 U/L (ref 0–55)
AST: 28 U/L (ref 5–34)
BUN: 13.3 mg/dL (ref 7.0–26.0)
CO2: 27 mEq/L (ref 22–29)
Creatinine: 0.9 mg/dL (ref 0.6–1.1)
EGFR: >90 mL/min/{1.73_m2} (ref >90)
Glucose: 100 mg/dl (ref 70–140)
Sodium: 143 mEq/L (ref 136–145)
Total Bilirubin: 0.33 mg/dL (ref 0.20–1.20)
Total Protein: 7.3 g/dL (ref 6.4–8.3)

## 2015-06-12 LAB — CBC WITH DIFFERENTIAL/PLATELET
BASO%: 0.7 % (ref 0.0–2.0)
Basophils Absolute: 0.1 10e3/uL (ref 0.0–0.1)
EOS%: 1.7 % (ref 0.0–7.0)
Eosinophils Absolute: 0.2 10e3/uL (ref 0.0–0.5)
HCT: 35.4 % (ref 34.8–46.6)
HGB: 11.9 g/dL (ref 11.6–15.9)
LYMPH%: 19.9 % (ref 14.0–49.7)
MCH: 30.8 pg (ref 25.1–34.0)
MCHC: 33.7 g/dL (ref 31.5–36.0)
MCV: 91.4 fL (ref 79.5–101.0)
MONO#: 0.7 10e3/uL (ref 0.1–0.9)
MONO%: 7.9 % (ref 0.0–14.0)
NEUT#: 6.2 10*3/uL (ref 1.5–6.5)
NEUT%: 69.8 % (ref 38.4–76.8)
Platelets: 289 10e3/uL (ref 145–400)
RBC: 3.87 10*6/uL (ref 3.70–5.45)
RDW: 13.4 % (ref 11.2–14.5)
WBC: 8.8 10*3/uL (ref 3.9–10.3)
lymph#: 1.8 10e3/uL (ref 0.9–3.3)

## 2015-06-12 LAB — COMPREHENSIVE METABOLIC PANEL WITH GFR
Albumin: 3.3 g/dL — ABNORMAL LOW (ref 3.5–5.0)
Alkaline Phosphatase: 51 U/L (ref 40–150)
Anion Gap: 9 meq/L (ref 3–11)
Calcium: 8.8 mg/dL (ref 8.4–10.4)
Chloride: 107 meq/L (ref 98–109)
Potassium: 3.7 meq/L (ref 3.5–5.1)

## 2015-06-12 MED ORDER — TRASTUZUMAB CHEMO INJECTION 440 MG
6.0000 mg/kg | Freq: Once | INTRAVENOUS | Status: AC
  Administered 2015-06-12: 672 mg via INTRAVENOUS
  Filled 2015-06-12: qty 32

## 2015-06-12 MED ORDER — SODIUM CHLORIDE 0.9 % IV SOLN
Freq: Once | INTRAVENOUS | Status: AC
  Administered 2015-06-12: 11:00:00 via INTRAVENOUS

## 2015-06-12 MED ORDER — ACETAMINOPHEN 325 MG PO TABS
ORAL_TABLET | ORAL | Status: AC
  Filled 2015-06-12: qty 2

## 2015-06-12 MED ORDER — SODIUM CHLORIDE 0.9 % IJ SOLN
10.0000 mL | INTRAMUSCULAR | Status: DC | PRN
  Administered 2015-06-12: 10 mL
  Filled 2015-06-12: qty 10

## 2015-06-12 MED ORDER — HEPARIN SOD (PORK) LOCK FLUSH 100 UNIT/ML IV SOLN
500.0000 [IU] | Freq: Once | INTRAVENOUS | Status: AC | PRN
  Administered 2015-06-12: 500 [IU]
  Filled 2015-06-12: qty 5

## 2015-06-12 MED ORDER — DIPHENHYDRAMINE HCL 25 MG PO CAPS
ORAL_CAPSULE | ORAL | Status: AC
  Filled 2015-06-12: qty 2

## 2015-06-12 MED ORDER — ACETAMINOPHEN 325 MG PO TABS
650.0000 mg | ORAL_TABLET | Freq: Once | ORAL | Status: AC
Start: 2015-06-12 — End: 2015-06-12
  Administered 2015-06-12: 650 mg via ORAL

## 2015-06-12 MED ORDER — DIPHENHYDRAMINE HCL 25 MG PO CAPS
50.0000 mg | ORAL_CAPSULE | Freq: Once | ORAL | Status: AC
Start: 2015-06-12 — End: 2015-06-12
  Administered 2015-06-12: 50 mg via ORAL

## 2015-06-12 NOTE — Patient Instructions (Signed)
[facility] Discharge Instructions for Patients Receiving Chemotherapy  Today you received the following chemotherapy agents Herceptin  To help prevent nausea and vomiting after your treatment, we encourage you to take your nausea medication    If you develop nausea and vomiting that is not controlled by your nausea medication, call the clinic.   BELOW ARE SYMPTOMS THAT SHOULD BE REPORTED IMMEDIATELY:  *FEVER GREATER THAN 100.5 F  *CHILLS WITH OR WITHOUT FEVER  NAUSEA AND VOMITING THAT IS NOT CONTROLLED WITH YOUR NAUSEA MEDICATION  *UNUSUAL SHORTNESS OF BREATH  *UNUSUAL BRUISING OR BLEEDING  TENDERNESS IN MOUTH AND THROAT WITH OR WITHOUT PRESENCE OF ULCERS  *URINARY PROBLEMS  *BOWEL PROBLEMS  UNUSUAL RASH Items with * indicate a potential emergency and should be followed up as soon as possible.  Feel free to call the clinic you have any questions or concerns. The clinic phone number is [phone].  Please show the CHEMO ALERT CARD at check-in to the Emergency Department and triage nurse.   

## 2015-06-12 NOTE — Progress Notes (Signed)
Patient states she has always taken the tylenol and two benadryl and has never had a problem driving afterwards.

## 2015-06-12 NOTE — Progress Notes (Signed)
Unable to get in to exam room prior to MD.  No assessment performed.  

## 2015-06-12 NOTE — Telephone Encounter (Signed)
OK per Dr. Lindi Adie

## 2015-06-13 ENCOUNTER — Telehealth: Payer: Self-pay | Admitting: Hematology and Oncology

## 2015-06-13 NOTE — Telephone Encounter (Signed)
Spoke with patient of confirm appt date/time changes per 2/22 pof

## 2015-06-16 ENCOUNTER — Ambulatory Visit: Payer: BLUE CROSS/BLUE SHIELD | Admitting: Physical Therapy

## 2015-06-18 ENCOUNTER — Ambulatory Visit: Payer: BLUE CROSS/BLUE SHIELD | Attending: Hematology and Oncology | Admitting: Physical Therapy

## 2015-06-18 DIAGNOSIS — R531 Weakness: Secondary | ICD-10-CM

## 2015-06-18 DIAGNOSIS — R6889 Other general symptoms and signs: Secondary | ICD-10-CM | POA: Insufficient documentation

## 2015-06-18 DIAGNOSIS — M6281 Muscle weakness (generalized): Secondary | ICD-10-CM | POA: Insufficient documentation

## 2015-06-18 DIAGNOSIS — M25611 Stiffness of right shoulder, not elsewhere classified: Secondary | ICD-10-CM

## 2015-06-18 DIAGNOSIS — M25511 Pain in right shoulder: Secondary | ICD-10-CM | POA: Insufficient documentation

## 2015-06-18 NOTE — Therapy (Signed)
[place], [place], [address] Phone: [phone]   Fax:  [phone]  Physical Therapy Treatment  Patient Details  Name: Krystal Kelley MRN: [id number] Date of Birth: 1970-08-14 Referring Provider: Nicholas Lose, MD  Encounter Date: 06/18/2015      PT End of Session - 06/18/15 1242    Visit Number 7   Number of Visits 15   Date for PT Re-Evaluation 07/19/15   PT Start Time 1115   PT Stop Time 1200   PT Time Calculation (min) 45 min   Activity Tolerance Patient tolerated treatment well   Behavior During Therapy [facility] for tasks assessed/performed      Past Medical History  Diagnosis Date  . Hypertension   . Breast cancer, right breast ([place])   . Radiation 10/30/14-12/17/14    right breast 50.4 gray    Past Surgical History  Procedure Laterality Date  . Bunion surgrery    . Carpel tunnel    . Breast biopsy  05/16/14  . Cholecystectomy  2015  . Portacath placement Left 06/18/2014    Procedure: INSERTION PORT-A-CATH;  Surgeon: Excell Seltzer, MD;  Location: [place];  Service: General;  Laterality: Left;    There were no vitals filed for this visit.  Visit Diagnosis:  Decreased ROM of right shoulder - Plan: PT plan of care cert/re-cert  Decreased strength - Plan: PT plan of care cert/re-cert  Impaired function of upper extremity - Plan: PT plan of care cert/re-cert      Subjective Assessment - 06/18/15 1115    Subjective "I think I overdid it this weekend and between that and the tamoxifen, I was in so much pain."  Was up all night Sunday night.  Got leg cramps.  Doctor thinks it's the muscle, but I want to make sure, so I'm going to go to see Dr. Noemi Chapel. Everything is stiff when I wake up in the morning.  Gets Achilles pain, hp pain, especially right hip and low back.  Did a lot of walking, moving around over the weekend, all three days.   Currently in Pain? Yes   Pain Score  0-No pain  no pain right now   Pain Location Hip  and arm   Pain Orientation Right   Aggravating Factors  certain movements   Pain Relieving Factors rest            [facility] PT Assessment - 06/18/15 0001    AROM   Right Shoulder Flexion 170 Degrees  patient with substitution today; had pain over weekend   Right Shoulder ABduction 190 Degrees                     [facility] Adult PT Treatment/Exercise - 06/18/15 0001    Elbow Exercises   Elbow Flexion Strengthening;Both;Standing;10 reps  20 # on machine--difficult   Elbow Extension Strengthening;10 reps;Standing;Other (comment)  20# on machine, then 25# x 10   Knee/Hip Exercises: Machines for Strengthening   Other Machine Batca leg press 95# x 10 x 2, then 105# x 10   Shoulder Exercises: Seated   Other Seated Exercises lat pulldown machine 35# x 10 x 2; pect flies on machine, 20# x 10, 25# x 10   Other Seated Exercises seated row on machine, 35# x 10 x 2; seated forward chest press 15# x 10, then 20# x 10  Long Term Clinic Goals - 06/18/15 1120    CC Long Term Goal  #1   Title Right shoulder active flexion to at least 165 degrees.   Status Achieved   CC Long Term Goal  #2   Title Right shoulder active abduction to at least 180 degrees.   Status Achieved   CC Long Term Goal  #4   Title Pt. will report perception of at least 30% improvement in functional strength in right shoulder.   Baseline feels 10% better as of 06/18/15   Status Partially Met            Plan - 06/18/15 1243    Clinical Impression Statement Patient progressing toward being able to return to a gym program.  She was able to increase weights on weight equipment today.  We discussed her continuing to walk at least 30 minutes and also to do a 30-minute "Curves"-type circuit at the gym.  She met a couple of goals today and has made progress towards others.   Pt will benefit from skilled therapeutic intervention  in order to improve on the following deficits Decreased range of motion;Decreased strength;Impaired UE functional use   Rehab Potential Excellent   PT Frequency 2x / week   PT Duration 4 weeks   PT Treatment/Interventions ADLs/Self Care Home Management;Therapeutic exercise;Patient/family education;Manual techniques;Passive range of motion   PT Next Visit Plan Continue shoulder stretching.  Work with gym equipment for strengthening with goal of transition to her going to her gym.  Continue HEP and ROM.   PT Home Exercise Plan continue with ROM as instructed earlier   Consulted and Agree with Plan of Care Patient        Problem List Patient Active Problem List   Diagnosis Date Noted  . Obesity (BMI 35.0-39.9 without comorbidity) ([place]) 03/20/2015  . Leg edema, right 10/03/2014  . Genetic testing 07/10/2014  . Breast cancer of lower-inner quadrant of right female breast ([place]) 05/29/2014    Brylea Pita 06/18/2015, 12:49 PM  [place], [place], [address] Phone: [phone]   Fax:  [phone]  Name: ZAHRIYAH JOO MRN: [id number] Date of Birth: 03-02-1971    Serafina Royals, PT 06/18/2015 12:49 PM

## 2015-06-23 ENCOUNTER — Ambulatory Visit: Payer: BLUE CROSS/BLUE SHIELD

## 2015-06-23 DIAGNOSIS — M25511 Pain in right shoulder: Secondary | ICD-10-CM | POA: Diagnosis not present

## 2015-06-23 DIAGNOSIS — M25611 Stiffness of right shoulder, not elsewhere classified: Secondary | ICD-10-CM

## 2015-06-23 DIAGNOSIS — R6889 Other general symptoms and signs: Secondary | ICD-10-CM

## 2015-06-23 NOTE — Therapy (Signed)
[place], [place], [address] Phone: [phone]   Fax:  [phone]  Physical Therapy Treatment  Patient Details  Name: Krystal Kelley MRN: [id number] Date of Birth: Jul 23, 1970 Referring Provider: Nicholas Lose, MD  Encounter Date: 06/23/2015      PT End of Session - 06/23/15 1216    Visit Number 8   Number of Visits 15   Date for PT Re-Evaluation 07/19/15   PT Start Time 0905  Pt arrived 20 mins late   PT Stop Time 0932   PT Time Calculation (min) 27 min   Activity Tolerance Patient tolerated treatment well   Behavior During Therapy [facility] for tasks assessed/performed      Past Medical History  Diagnosis Date  . Hypertension   . Breast cancer, right breast ([place])   . Radiation 10/30/14-12/17/14    right breast 50.4 gray    Past Surgical History  Procedure Laterality Date  . Bunion surgrery    . Carpel tunnel    . Breast biopsy  05/16/14  . Cholecystectomy  2015  . Portacath placement Left 06/18/2014    Procedure: INSERTION PORT-A-CATH;  Surgeon: Excell Seltzer, MD;  Location: [place];  Service: General;  Laterality: Left;    There were no vitals filed for this visit.  Visit Diagnosis:  Decreased ROM of right shoulder  Impaired function of upper extremity      Subjective Assessment - 06/23/15 0909    Subjective My doctor said the back pain is just muscular, but it's been really bothering me into my Rt hip. My back isn't bothering me right now, but it woke me up this morning around 3 AM. I want to stretch my Rt shoulder today.    Pertinent History Diagnosed with right breast cancer in February 2016; had lumpectomy June 18, 2014 with 4-5 lymph nodes removed (all negative).  Adjuvant chemotherapy (still on herceptin) and radiation, completed August 2016.  Had superficial blood clot in right leg that caused swelling, so was put on Lasix. ON tamoxifen.    Patient Stated Goals  get right arm stronger and working better   Currently in Pain? No/denies                         [facility] Adult PT Treatment/Exercise - 06/23/15 0001    Shoulder Exercises: Pulleys   Flexion 2 minutes   ABduction 2 minutes   Shoulder Exercises: Therapy Ball   Flexion 10 reps   ABduction 5 reps  Rt shoulder   Manual Therapy   Passive ROM In Supine to Rt shoulder into flexion, abduction, D2 and neural tension stretch all to pts tolerance                        Long Term Clinic Goals - 06/18/15 1120    CC Long Term Goal  #1   Title Right shoulder active flexion to at least 165 degrees.   Status Achieved   CC Long Term Goal  #2   Title Right shoulder active abduction to at least 180 degrees.   Status Achieved   CC Long Term Goal  #4   Title Pt. will report perception of at least 30% improvement in functional strength in right shoulder.   Baseline feels 10% better as of 06/18/15   Status Partially Met            Plan - 06/23/15 1217  Clinical Impression Statement Pt running 20 mins late today and wanted to focus on Rt shoulder ROM. Overall did well with this and tolerated AA/PROM well.     Pt will benefit from skilled therapeutic intervention in order to improve on the following deficits Decreased range of motion;Decreased strength;Impaired UE functional use   Rehab Potential Excellent   PT Frequency 2x / week   PT Duration 4 weeks   PT Treatment/Interventions ADLs/Self Care Home Management;Therapeutic exercise;Patient/family education;Manual techniques;Passive range of motion   PT Next Visit Plan Continue shoulder stretching.  Work with gym equipment for strengthening with goal of transition to her going to her gym.  Continue HEP and ROM.   Consulted and Agree with Plan of Care Patient        Problem List Patient Active Problem List   Diagnosis Date Noted  . Obesity (BMI 35.0-39.9 without comorbidity) ([place]) 03/20/2015  . Leg edema, right  10/03/2014  . Genetic testing 07/10/2014  . Breast cancer of lower-inner quadrant of right female breast ([place]) 05/29/2014    Otelia Limes, PTA 06/23/2015, 12:18 PM  [place], [place], [address] Phone: [phone]   Fax:  [phone]  Name: Krystal Kelley MRN: [id number] Date of Birth: 1970/06/05

## 2015-06-25 ENCOUNTER — Ambulatory Visit: Payer: BLUE CROSS/BLUE SHIELD

## 2015-06-26 ENCOUNTER — Ambulatory Visit: Payer: BLUE CROSS/BLUE SHIELD

## 2015-06-26 DIAGNOSIS — R531 Weakness: Secondary | ICD-10-CM

## 2015-06-26 DIAGNOSIS — R6889 Other general symptoms and signs: Secondary | ICD-10-CM

## 2015-06-26 DIAGNOSIS — M25611 Stiffness of right shoulder, not elsewhere classified: Secondary | ICD-10-CM

## 2015-06-26 DIAGNOSIS — M25511 Pain in right shoulder: Secondary | ICD-10-CM | POA: Diagnosis not present

## 2015-06-26 NOTE — Therapy (Signed)
[place], [place], [address] Phone: [phone]   Fax:  [phone]  Physical Therapy Treatment  Patient Details  Name: Krystal Kelley MRN: [id number] Date of Birth: Feb 11, 1971 Referring Provider: Nicholas Lose, MD  Encounter Date: 06/26/2015      PT End of Session - 06/26/15 1008    Visit Number 9   Number of Visits 15   Date for PT Re-Evaluation 07/19/15   PT Start Time 0938   PT Stop Time 1019   PT Time Calculation (min) 41 min   Activity Tolerance Patient tolerated treatment well   Behavior During Therapy [facility] for tasks assessed/performed      Past Medical History  Diagnosis Date  . Hypertension   . Breast cancer, right breast ([place])   . Radiation 10/30/14-12/17/14    right breast 50.4 gray    Past Surgical History  Procedure Laterality Date  . Bunion surgrery    . Carpel tunnel    . Breast biopsy  05/16/14  . Cholecystectomy  2015  . Portacath placement Left 06/18/2014    Procedure: INSERTION PORT-A-CATH;  Surgeon: Excell Seltzer, MD;  Location: [place];  Service: General;  Laterality: Left;    There were no vitals filed for this visit.  Visit Diagnosis:  Decreased ROM of right shoulder  Impaired function of upper extremity  Decreased strength      Subjective Assessment - 06/26/15 0943    Subjective Been using my Rt arm a little more, I can tell it's still weak but it's slowly getting better. And I've been doing my other exercises too. My low back was hurting this morning, but it's better now. I've been taking a percocet every night because that's when it's been worse.    Pertinent History Diagnosed with right breast cancer in February 2016; had lumpectomy June 18, 2014 with 4-5 lymph nodes removed (all negative).  Adjuvant chemotherapy (still on herceptin) and radiation, completed August 2016.  Had superficial blood clot in right leg that caused swelling, so was  put on Lasix. ON tamoxifen.    Patient Stated Goals get right arm stronger and working better   Currently in Pain? No/denies            [facility] PT Assessment - 06/26/15 0001    AROM   Right Shoulder Flexion 153 Degrees  With no scapular compensations                     [facility] Adult PT Treatment/Exercise - 06/26/15 0001    Shoulder Exercises: Standing   Other Standing Exercises 3-way arm raises with 2 lbs. x 10 each way; except no weight with abduction as this was initiailly painful but as pt continued with cuing for proper technique, pain decreased significantly and just weakness kept her from lifting the weight (we attempted weight again after 10 reps)   Shoulder Exercises: Pulleys   Flexion 2 minutes   ABduction 2 minutes   Shoulder Exercises: Therapy Ball   Flexion 10 reps   ABduction 10 reps  Rt shoulder   Manual Therapy   Passive ROM In Supine to Rt shoulder into flexion, abduction, D2 and neural tension stretch all to pts tolerance                PT Education - 06/26/15 1022    Education provided Yes   Education Details 3 way raises   Person(s) Educated Patient   Methods Explanation;Demonstration;Tactile cues  Comprehension Verbalized understanding;Returned demonstration                Long Term Clinic Goals - 06/26/15 1022    CC Long Term Goal  #1   Title Right shoulder active flexion to at least 165 degrees.   Status Achieved   CC Long Term Goal  #2   Title Right shoulder active abduction to at least 180 degrees.   Status Achieved   CC Long Term Goal  #3   Title Patient will be independent in HEP including strengthening for right shoulder, and in safe self-progression.   Status Partially Met   CC Long Term Goal  #4   Title Pt. will report perception of at least 30% improvement in functional strength in right shoulder.   Status Partially Met            Plan - 06/26/15 1020    Clinical Impression Statement Pt had decrease  of her AROM today with easurements today, however she wasn't substituting as she did last time measured due to pain. Only tightness reported at end ROM now with no compensations. Pt is pleased with how her arm has been feeling a little looser and slightly stronger past few days.    Pt will benefit from skilled therapeutic intervention in order to improve on the following deficits Decreased range of motion;Decreased strength;Impaired UE functional use   Rehab Potential Excellent   PT Frequency 2x / week   PT Duration 4 weeks   PT Treatment/Interventions ADLs/Self Care Home Management;Therapeutic exercise;Patient/family education;Manual techniques;Passive range of motion   PT Next Visit Plan Continue shoulder stretching.  Work with gym equipment for strengthening with goal of transition to her going to her gym.  Continue HEP and ROM.   PT Home Exercise Plan Focus on AAROM stretching into flexion over weekend and 3 way raises.    Consulted and Agree with Plan of Care Patient        Problem List Patient Active Problem List   Diagnosis Date Noted  . Obesity (BMI 35.0-39.9 without comorbidity) ([place]) 03/20/2015  . Leg edema, right 10/03/2014  . Genetic testing 07/10/2014  . Breast cancer of lower-inner quadrant of right female breast ([place]) 05/29/2014    Golda Acre 06/26/2015, 10:23 AM  [place], [place], [address] Phone: [phone]   Fax:  [phone]  Name: Krystal Kelley MRN: [id number] Date of Birth: 1970/11/11

## 2015-06-30 ENCOUNTER — Ambulatory Visit: Payer: BLUE CROSS/BLUE SHIELD

## 2015-07-02 ENCOUNTER — Encounter: Payer: Self-pay | Admitting: Podiatry

## 2015-07-02 ENCOUNTER — Ambulatory Visit
Admission: RE | Admit: 2015-07-02 | Discharge: 2015-07-02 | Disposition: A | Discharge status: Home / Self Care | Payer: BLUE CROSS/BLUE SHIELD | Source: Ambulatory Visit | Attending: Orthopedic Surgery | Admitting: Orthopedic Surgery

## 2015-07-02 ENCOUNTER — Other Ambulatory Visit: Payer: Self-pay | Admitting: Orthopedic Surgery

## 2015-07-02 ENCOUNTER — Ambulatory Visit: Payer: BLUE CROSS/BLUE SHIELD | Admitting: Physical Therapy

## 2015-07-02 ENCOUNTER — Ambulatory Visit (INDEPENDENT_AMBULATORY_CARE_PROVIDER_SITE_OTHER): Payer: BLUE CROSS/BLUE SHIELD | Admitting: Podiatry

## 2015-07-02 VITALS — BP 134/89 | HR 83 | Resp 16

## 2015-07-02 DIAGNOSIS — G5792 Unspecified mononeuropathy of left lower limb: Secondary | ICD-10-CM

## 2015-07-02 DIAGNOSIS — M25551 Pain in right hip: Secondary | ICD-10-CM | POA: Diagnosis not present

## 2015-07-02 DIAGNOSIS — M722 Plantar fascial fibromatosis: Secondary | ICD-10-CM | POA: Diagnosis not present

## 2015-07-02 NOTE — Progress Notes (Signed)
She presents today with pain to her left heel. She states that I think he may be cancer meds but I need to see about getting an injection. She states that all joints seem to be hurting my Achilles bothers me by heel bothers me my knee and hip and elbow and shoulder all are hurting today.  Objective: Vital signs are stable she is alert and oriented 3. Pulses are palpable. She has pain on palpation medial continued tubercle of the left heel. No calf pain. No pain on range of motion of the ankle joints.  Assessment: Plantar fasciitis left.  Plan: Injected her left heel today with Kenalog and local anesthetic. Follow-up with her in 1 month to 6 weeks. She has her last dose of chemotherapy tomorrow.

## 2015-07-03 ENCOUNTER — Ambulatory Visit (HOSPITAL_BASED_OUTPATIENT_CLINIC_OR_DEPARTMENT_OTHER): Payer: BLUE CROSS/BLUE SHIELD

## 2015-07-03 ENCOUNTER — Ambulatory Visit: Payer: BLUE CROSS/BLUE SHIELD | Admitting: Hematology and Oncology

## 2015-07-03 ENCOUNTER — Other Ambulatory Visit (HOSPITAL_BASED_OUTPATIENT_CLINIC_OR_DEPARTMENT_OTHER): Payer: BLUE CROSS/BLUE SHIELD

## 2015-07-03 VITALS — BP 120/73 | HR 96 | Temp 98.1°F | Resp 18

## 2015-07-03 DIAGNOSIS — C50311 Malignant neoplasm of lower-inner quadrant of right female breast: Secondary | ICD-10-CM | POA: Diagnosis not present

## 2015-07-03 DIAGNOSIS — Z5111 Encounter for antineoplastic chemotherapy: Secondary | ICD-10-CM | POA: Diagnosis not present

## 2015-07-03 LAB — COMPREHENSIVE METABOLIC PANEL WITH GFR
ALT: 28 U/L (ref 0–55)
AST: 26 U/L (ref 5–34)
Anion Gap: 10 meq/L (ref 3–11)
BUN: 18.6 mg/dL (ref 7.0–26.0)
CO2: 27 meq/L (ref 22–29)
Chloride: 104 meq/L (ref 98–109)
Creatinine: 1.1 mg/dL (ref 0.6–1.1)
Glucose: 129 mg/dL (ref 70–140)
Sodium: 140 meq/L (ref 136–145)
Total Bilirubin: 0.33 mg/dL (ref 0.20–1.20)
Total Protein: 8.7 g/dL — ABNORMAL HIGH (ref 6.4–8.3)

## 2015-07-03 LAB — CBC WITH DIFFERENTIAL/PLATELET
BASO%: 0.2 % (ref 0.0–2.0)
Basophils Absolute: 0 10*3/uL (ref 0.0–0.1)
EOS%: 0.1 % (ref 0.0–7.0)
Eosinophils Absolute: 0 10*3/uL (ref 0.0–0.5)
HCT: 39.5 % (ref 34.8–46.6)
HGB: 13.1 g/dL (ref 11.6–15.9)
LYMPH%: 11.5 % — ABNORMAL LOW (ref 14.0–49.7)
MCH: 30.4 pg (ref 25.1–34.0)
MCHC: 33.2 g/dL (ref 31.5–36.0)
MCV: 91.5 fL (ref 79.5–101.0)
MONO#: 0.6 10e3/uL (ref 0.1–0.9)
MONO%: 4.2 % (ref 0.0–14.0)
NEUT#: 11.3 10e3/uL — ABNORMAL HIGH (ref 1.5–6.5)
NEUT%: 84 % — ABNORMAL HIGH (ref 38.4–76.8)
Platelets: 320 10e3/uL (ref 145–400)
RBC: 4.32 10e6/uL (ref 3.70–5.45)
RDW: 13.8 % (ref 11.2–14.5)
WBC: 13.5 10*3/uL — ABNORMAL HIGH (ref 3.9–10.3)
lymph#: 1.5 10e3/uL (ref 0.9–3.3)

## 2015-07-03 LAB — COMPREHENSIVE METABOLIC PANEL
Albumin: 3.7 g/dL (ref 3.5–5.0)
Alkaline Phosphatase: 60 U/L (ref 40–150)
Calcium: 9.4 mg/dL (ref 8.4–10.4)
EGFR: 71 mL/min/{1.73_m2} — ABNORMAL LOW (ref >90)
Potassium: 3.9 mEq/L (ref 3.5–5.1)

## 2015-07-03 MED ORDER — ACETAMINOPHEN 325 MG PO TABS
650.0000 mg | ORAL_TABLET | Freq: Once | ORAL | Status: AC
  Administered 2015-07-03: 650 mg via ORAL

## 2015-07-03 MED ORDER — DIPHENHYDRAMINE HCL 25 MG PO CAPS
ORAL_CAPSULE | ORAL | Status: AC
  Filled 2015-07-03: qty 2

## 2015-07-03 MED ORDER — SODIUM CHLORIDE 0.9 % IV SOLN
Freq: Once | INTRAVENOUS | Status: AC
  Administered 2015-07-03: 11:00:00 via INTRAVENOUS

## 2015-07-03 MED ORDER — DIPHENHYDRAMINE HCL 25 MG PO CAPS
50.0000 mg | ORAL_CAPSULE | Freq: Once | ORAL | Status: AC
  Administered 2015-07-03: 50 mg via ORAL

## 2015-07-03 MED ORDER — ACETAMINOPHEN 325 MG PO TABS
ORAL_TABLET | ORAL | Status: AC
  Filled 2015-07-03: qty 2

## 2015-07-03 MED ORDER — TRASTUZUMAB CHEMO INJECTION 440 MG
6.0000 mg/kg | Freq: Once | INTRAVENOUS | Status: AC
  Administered 2015-07-03: 672 mg via INTRAVENOUS
  Filled 2015-07-03: qty 32

## 2015-07-03 MED ORDER — HEPARIN SOD (PORK) LOCK FLUSH 100 UNIT/ML IV SOLN
500.0000 [IU] | Freq: Once | INTRAVENOUS | Status: AC | PRN
  Administered 2015-07-03: 500 [IU]
  Filled 2015-07-03: qty 5

## 2015-07-03 MED ORDER — SODIUM CHLORIDE 0.9 % IJ SOLN
10.0000 mL | INTRAMUSCULAR | Status: DC | PRN
  Administered 2015-07-03: 10 mL
  Filled 2015-07-03: qty 10

## 2015-07-03 NOTE — Patient Instructions (Signed)
[place] Discharge Instructions for Patients Receiving Chemotherapy  Today you received the following chemotherapy agent, Herceptin.  To help prevent nausea and vomiting after your treatment, we encourage you to take your nausea medication as prescribed.   If you develop nausea and vomiting that is not controlled by your nausea medication, call the clinic.   BELOW ARE SYMPTOMS THAT SHOULD BE REPORTED IMMEDIATELY:  *FEVER GREATER THAN 100.5 F  *CHILLS WITH OR WITHOUT FEVER  NAUSEA AND VOMITING THAT IS NOT CONTROLLED WITH YOUR NAUSEA MEDICATION  *UNUSUAL SHORTNESS OF BREATH  *UNUSUAL BRUISING OR BLEEDING  TENDERNESS IN MOUTH AND THROAT WITH OR WITHOUT PRESENCE OF ULCERS  *URINARY PROBLEMS  *BOWEL PROBLEMS  UNUSUAL RASH Items with * indicate a potential emergency and should be followed up as soon as possible.  Feel free to call the clinic you have any questions or concerns. The clinic phone number is [phone].  Please show the [place] at check-in to the Emergency Department and triage nurse.

## 2015-07-08 ENCOUNTER — Other Ambulatory Visit: Payer: Self-pay | Admitting: *Deleted

## 2015-07-08 ENCOUNTER — Telehealth: Payer: Self-pay | Admitting: Nurse Practitioner

## 2015-07-08 DIAGNOSIS — C50311 Malignant neoplasm of lower-inner quadrant of right female breast: Secondary | ICD-10-CM

## 2015-07-08 NOTE — Telephone Encounter (Signed)
Per NP, Scheduled PT in April. Spoke w/ Pt, and Pt confirmed time and date. Will send out appt reminder tomorrow

## 2015-07-10 ENCOUNTER — Ambulatory Visit: Payer: BLUE CROSS/BLUE SHIELD

## 2015-07-10 DIAGNOSIS — M25511 Pain in right shoulder: Secondary | ICD-10-CM | POA: Diagnosis not present

## 2015-07-10 DIAGNOSIS — M25611 Stiffness of right shoulder, not elsewhere classified: Secondary | ICD-10-CM

## 2015-07-10 DIAGNOSIS — R6889 Other general symptoms and signs: Secondary | ICD-10-CM

## 2015-07-10 DIAGNOSIS — R531 Weakness: Secondary | ICD-10-CM

## 2015-07-10 NOTE — Therapy (Signed)
[place], [place], [address] Phone: [phone]   Fax:  [phone]  Physical Therapy Treatment  Patient Details  Name: Krystal [place] MRN: [id number] Date of Birth: [birth year] Referring Provider: Nicholas Lose, MD  Encounter Date: 07/10/2015      PT End of Session - 07/10/15 1049    Visit Number 10   Number of Visits 15   Date for PT Re-Evaluation 07/19/15   PT Start Time 6767  Pt arrived late but didn't have a 1015 so kept her longer.   PT Stop Time 1044   PT Time Calculation (min) 49 min   Activity Tolerance Patient tolerated treatment well   Behavior During Therapy WFL for tasks assessed/performed      Past Medical History  Diagnosis Date  . Hypertension   . Breast cancer, right breast ([place])   . Radiation 10/30/14-12/17/14    right breast 50.4 gray    Past Surgical History  Procedure Laterality Date  . Bunion surgrery    . Carpel tunnel    . Breast biopsy  05/16/14  . Cholecystectomy  2015  . Portacath placement Left 06/18/2014    Procedure: INSERTION PORT-A-CATH;  Surgeon: Excell Seltzer, MD;  Location: [place];  Service: General;  Laterality: Left;    There were no vitals filed for this visit.  Visit Diagnosis:  Decreased ROM of right shoulder  Impaired function of upper extremity  Decreased strength      Subjective Assessment - 07/10/15 1000    Subjective Saw Dr. Noemi Chapel for my Rt hip and shoulder. I have bone spurs and tendonitis in my Rt shoulder and he gave me a shot ad it feels great now. He also put me on Prednisone but I dont remember the dose.    Pertinent History Diagnosed with right breast cancer in February 2016; had lumpectomy June 18, 2014 with 4-5 lymph nodes removed (all negative).  Adjuvant chemotherapy (still on herceptin) and radiation, completed August 2016.  Had superficial blood clot in right leg that caused swelling, so was put on Lasix.  ON tamoxifen.    Patient Stated Goals get right arm stronger and working better   Currently in Pain? No/denies            [facility] PT Assessment - 07/10/15 0001    AROM   Right Shoulder Flexion 174 Degrees  No pain or tightness reported at end ROM.   Right Shoulder ABduction 190 Degrees                     [facility] Adult PT Treatment/Exercise - 07/10/15 0001    Knee/Hip Exercises: Aerobic   Nustep Level 6 for 3 mins, level 5 3 minutes   Knee/Hip Exercises: Machines for Strengthening   Other Machine Batca leg press 95# x 10 x 2, then 105# 2 x 10   Shoulder Exercises: Standing   Other Standing Exercises Rockwood 4 ways with Rt Ue and red theraband 10 reps each   Shoulder Exercises: Therapy Ball   Flexion 15 reps  With forward lean at end of ROM   Shoulder Exercises: ROM/Strengthening   Other ROM/Strengthening Exercises Batca Lat Pull Down 25# 2 x 10; then in same seat for scapular retraction 25# 2 x 10, 35# x 10   Other ROM/Strengthening Exercises Ended session today with Rt shoulder stretches on wall: Chest, shoulder and tricep (from Strength ABC Packet) and modified downward dog on wall all 10-15 sec  holds, 1 rep each   Shoulder Exercises: Stretch   Corner Stretch 3 reps;20 seconds  In doorway                PT Education - 07/10/15 1023    Education provided Yes   Education Details Reissued Rockwood and 3 way UE raises so pt can have them on one handout.    Person(s) Educated Patient   Methods Explanation;Demonstration;Handout   Comprehension Verbalized understanding;Returned demonstration                Long Term Clinic Goals - 07/10/15 1005    CC Long Term Goal  #1   Title Right shoulder active flexion to at least 165 degrees.  174 degrees today 07/10/15   Status Achieved   CC Long Term Goal  #2   Title Right shoulder active abduction to at least 180 degrees.  190 degrees 07/10/15   Status Achieved   CC Long Term Goal  #3   Title Patient  will be independent in HEP including strengthening for right shoulder, and in safe self-progression.   Status Partially Met   CC Long Term Goal  #4   Title Pt. will report perception of at least 30% improvement in functional strength in right shoulder.  70% improvement reported 07/10/15   Status Achieved            Plan - 07/10/15 1050    Clinical Impression Statement Pt did excellent today with not having any pain with any of the exercises we did. She also had full AROM when measured. She benefits decrease pain to shot from Dr. Noemi Chapel and is excited to be able to continue toward her goal of I with HEP and to progress to gym. She tolerated session very well today and she was pleased with how she felt after.    Pt will benefit from skilled therapeutic intervention in order to improve on the following deficits Decreased range of motion;Decreased strength;Impaired UE functional use   Rehab Potential Excellent   Clinical Impairments Affecting Rehab Potential none    PT Frequency 2x / week   PT Duration 4 weeks   PT Treatment/Interventions ADLs/Self Care Home Management;Therapeutic exercise;Patient/family education;Manual techniques;Passive range of motion   PT Next Visit Plan  PT to evaluate pt for Rt hip next session. Pt got order from Dr. Noemi Chapel but was going to call Dr. Lindi Adie to try to get an order from him. Cont to also work with gym equipment for strengthening with goal of transition to her going to her gym.  Continue HEP and ROM.   Consulted and Agree with Plan of Care Patient        Problem List Patient Active Problem List   Diagnosis Date Noted  . Obesity (BMI 35.0-39.9 without comorbidity) ([place]) 03/20/2015  . Leg edema, right 10/03/2014  . Genetic testing 07/10/2014  . Breast cancer of lower-inner quadrant of right female breast ([place]) 05/29/2014    Otelia Limes, PTA 07/10/2015, 10:54 AM  [place], [place], [address] Phone: [phone]   Fax:  [phone]  Name: K[place] MRN: [id number] Date of Birth: 05/11/1970

## 2015-07-10 NOTE — Patient Instructions (Signed)
Strengthening: Resisted Internal Rotation   Hold tubing in left hand, elbow at side and forearm out. Rotate forearm in across body. Repeat _10___ times per set. Do _1-2___ sets per session. Do _1-2___ sessions per day.  http://orth.exer.us/830   Copyright  VHI. All rights reserved.  Strengthening: Resisted External Rotation   Hold tubing in right hand, elbow at side and forearm across body. Rotate forearm out. Repeat __10__ times per set. Do _1-2___ sets per session. Do _1-2___ sessions per day.  http://orth.exer.us/828   Copyright  VHI. All rights reserved.  Strengthening: Resisted Flexion   Hold tubing with left arm at side. Pull forward and up. Move shoulder through pain-free range of motion. Repeat __10__ times per set. Do _1-2___ sets per session. Do _1-2___ sessions per day.  http://orth.exer.us/824   Copyright  VHI. All rights reserved.  Strengthening: Resisted Extension   Hold tubing in right hand, arm forward. Pull arm back, elbow straight. Repeat _10___ times per set. Do _1-2___ sets per session. Do __1-2__ sessions per day.  http://orth.exer.us/832   Copyright  VHI. All rights reserved.   Standing 3 way raises: Raise arms straight in front, a little wider in a "V", and then in a "T". 10 reps each, 1-2 sets, 1-2 times/day. Start with 2 lbs.

## 2015-07-16 ENCOUNTER — Ambulatory Visit: Payer: BLUE CROSS/BLUE SHIELD | Admitting: Physical Therapy

## 2015-07-22 ENCOUNTER — Encounter: Payer: Self-pay | Admitting: Hematology and Oncology

## 2015-07-22 ENCOUNTER — Ambulatory Visit: Payer: BLUE CROSS/BLUE SHIELD

## 2015-07-22 NOTE — Progress Notes (Signed)
Faxes done 03/03/15  02/26/15 sent to medical records

## 2015-07-24 ENCOUNTER — Other Ambulatory Visit: Payer: Self-pay

## 2015-07-24 ENCOUNTER — Ambulatory Visit: Payer: BLUE CROSS/BLUE SHIELD | Attending: Hematology and Oncology | Admitting: Physical Therapy

## 2015-07-24 DIAGNOSIS — M25611 Stiffness of right shoulder, not elsewhere classified: Secondary | ICD-10-CM | POA: Diagnosis present

## 2015-07-24 DIAGNOSIS — C50311 Malignant neoplasm of lower-inner quadrant of right female breast: Secondary | ICD-10-CM

## 2015-07-24 NOTE — Therapy (Signed)
[place], [address] Phone: [phone]   Fax:  [phone]  Physical Therapy Treatment  Patient Details  Name: Krystal [place] MRN: [id number] Date of Birth: [birth year] Referring Provider: Nicholas Lose, MD  Encounter Date: 07/24/2015      PT End of Session - 07/24/15 1151    Visit Number 11   Number of Visits 15   Date for PT Re-Evaluation 07/19/15   PT Start Time 5361   PT Stop Time 1131   PT Time Calculation (min) 46 min   Activity Tolerance Patient tolerated treatment well   Behavior During Therapy [facility] for tasks assessed/performed      Past Medical History  Diagnosis Date  . Hypertension   . Breast cancer, right breast ([place])   . Radiation 10/30/14-12/17/14    right breast 50.4 gray    Past Surgical History  Procedure Laterality Date  . Bunion surgrery    . Carpel tunnel    . Breast biopsy  05/16/14  . Cholecystectomy  2015  . Portacath placement Left 06/18/2014    Procedure: INSERTION PORT-A-CATH;  Surgeon: Excell Seltzer, MD;  Location: [place];  Service: General;  Laterality: Left;    There were no vitals filed for this visit.  Visit Diagnosis:  Stiffness of right shoulder, not elsewhere classified      Subjective Assessment - 07/24/15 1043    Subjective Pt states she has been having lots of cramping all over since she has been off the prednisone. She has not gotten the prescription from Dr. Lindi Adie to treat her hip, but will get that tomorrow. Pt says she walked a couple of days this week about 20 or 25 minutes.    Pertinent History Diagnosed with right breast cancer in February 2016; had lumpectomy June 18, 2014 with 4-5 lymph nodes removed (all negative).  Adjuvant chemotherapy (still on herceptin) and radiation, completed August 2016.  Had superficial blood clot in right leg that caused swelling, so was put on Lasix. ON tamoxifen.    Patient Stated  Goals get right arm stronger and working better   Currently in Pain? No/denies  her right shoulder feels like it needs to stretch                   Katina Dung - 07/24/15 0001    Open a tight or new jar No difficulty   Do heavy household chores (wash walls, wash floors) No difficulty   Carry a shopping bag or briefcase No difficulty   Wash your back Mild difficulty   Use a knife to cut food No difficulty   Recreational activities in which you take some force or impact through your arm, shoulder, or hand (golf, hammering, tennis) No difficulty   During the past week, to what extent has your arm, shoulder or hand problem interfered with your normal social activities with family, friends, neighbors, or groups? Not at all   During the past week, to what extent has your arm, shoulder or hand problem limited your work or other regular daily activities Not at all   Arm, shoulder, or hand pain. None   Tingling (pins and needles) in your arm, shoulder, or hand None   Difficulty Sleeping No difficulty   DASH Score 2.27 %               [facility] Adult PT Treatment/Exercise - 07/24/15 0001    Knee/Hip Exercises: Aerobic   Nustep level  6 for 10 minutes    Knee/Hip Exercises: Machines for Strengthening   Cybex Knee Extension 35 # x 2 sets of 10   Other Machine Batca leg pres x 10 x 3 with  105#    pt reports as "not hard "    Knee/Hip Exercises: Standing   Forward Step Up Both;1 set;10 reps   Shoulder Exercises: Seated   Other Seated Exercises seated row with 20# 2 sets of 10    Other Seated Exercises  seated forward chest press 15# x 10, then 20# x 10                        Long Term Clinic Goals - 07/24/15 1159    CC Long Term Goal  #1   Title Right shoulder active flexion to at least 165 degrees.   Status Achieved   CC Long Term Goal  #2   Title Right shoulder active abduction to at least 180 degrees.   Status Achieved   CC Long Term Goal  #3   Title  Patient will be independent in HEP including strengthening for right shoulder, and in safe self-progression.   Status Achieved   CC Long Term Goal  #4   Title Pt. will report perception of at least 30% improvement in functional strength in right shoulder.   Status Achieved            Plan - 07/24/15 1153    Clinical Impression Statement Pt is feeling much better since she got the "shot" in her right shoulder  She continues to have muscle cramps and complaints of multiple joint pain. She has not yet spoken to Dr. Lindi Adie about Dr. Archie Endo recommendation for PT  to her left hip.  She has not yet determined her plan for community exercise , but is interested in aquatic therapy in addition to strengthening with weights.  Gave her the brochure for [facility] at the [facility].  Leone Payor, PT emailed Dr. Lindi Adie about referring her to aquatic therapy through [facility] to continue to work hip and generalized  joint pain and general strength to allow her to transition to community exercise program    Pt will benefit from skilled therapeutic intervention in order to improve on the following deficits Decreased range of motion;Decreased strength;Impaired UE functional use   Rehab Potential Excellent   Clinical Impairments Affecting Rehab Potential none    PT Next Visit Plan Dicharge from this episode    Consulted and Agree with Plan of Care Patient        Problem List Patient Active Problem List   Diagnosis Date Noted  . Obesity (BMI 35.0-39.9 without comorbidity) ([place]) 03/20/2015  . Leg edema, right 10/03/2014  . Genetic testing 07/10/2014  . Breast cancer of lower-inner quadrant of right female breast ([place]) 05/29/2014   PHYSICAL THERAPY DISCHARGE SUMMARY  Visits from Start of Care: 11  Current functional level related to goals / functional outcomes: Improved; see above    Remaining deficits: Left hip pain: generalzed joint pain and muscle cramping  Pt to continue with  Aquatic Therapy at [facility] if order received   Education / Equipment: Home exercise  Plan: Patient agrees to discharge.  Patient goals were met. Patient is being discharged due to meeting the stated rehab goals.  ?????         Maudry Diego, PT 07/24/2015 12:03 PM   [place]  [place], [place], [address] Phone: [phone]   Fax:  [phone]  Name: K[place] MRN: [id number] Date of Birth: [birth year]

## 2015-07-25 ENCOUNTER — Encounter: Payer: BLUE CROSS/BLUE SHIELD | Admitting: Nurse Practitioner

## 2015-07-25 ENCOUNTER — Telehealth: Payer: Self-pay | Admitting: *Deleted

## 2015-07-25 ENCOUNTER — Telehealth: Payer: Self-pay | Admitting: Nurse Practitioner

## 2015-07-25 ENCOUNTER — Other Ambulatory Visit: Payer: Self-pay | Admitting: Nurse Practitioner

## 2015-07-25 NOTE — Telephone Encounter (Signed)
Received voice message from patient stating that she needed to cancel appointment due to illlness.  Will work with scheduling to reschedule pt's appointment for Survivorship Care plan review.

## 2015-07-25 NOTE — Telephone Encounter (Signed)
Patient called to cancel Survivorship appt due to illness. Returned call to patient to offer [facility] visit. Pt states her scratchy throat and congestion started yesterday. She has started taking an antihistamine and using an allergy nasal spray. She is prone to sinus infections. She will wait the weekend and call on Monday if she is not feeling any better. Pt understands to call this clinic if any further concerns

## 2015-07-30 ENCOUNTER — Telehealth: Payer: Self-pay | Admitting: Nurse Practitioner

## 2015-07-30 NOTE — Telephone Encounter (Signed)
Spoke with patient to confirm r/s survivorship appt for 4/28 at 10 am

## 2015-08-06 ENCOUNTER — Ambulatory Visit (INDEPENDENT_AMBULATORY_CARE_PROVIDER_SITE_OTHER): Payer: BLUE CROSS/BLUE SHIELD | Admitting: Podiatry

## 2015-08-06 ENCOUNTER — Encounter: Payer: Self-pay | Admitting: Podiatry

## 2015-08-06 DIAGNOSIS — G5792 Unspecified mononeuropathy of left lower limb: Secondary | ICD-10-CM | POA: Diagnosis not present

## 2015-08-06 DIAGNOSIS — M722 Plantar fascial fibromatosis: Secondary | ICD-10-CM

## 2015-08-06 NOTE — Progress Notes (Signed)
She presents today for follow-up of plantar fasciitis of the left foot. She states this seems to be doing much better after the last injection however there is one small area that is still painful and like to have another injection if possible.  Objective: Vital signs are stable alert and oriented 3 pulses are palpable left foot. No calf pain. She has pain on direct palpation of the central plantar calcaneal tubercle of the left heel. Plantar medial band of the plantar fascia does not demonstrate any pain.  Assessment: Plantar fasciitis resolving left.  Plan: Reinjected the area today with dexamethasone and local anesthetic.

## 2015-08-12 ENCOUNTER — Other Ambulatory Visit: Payer: Self-pay | Admitting: Nurse Practitioner

## 2015-08-12 ENCOUNTER — Telehealth: Payer: Self-pay | Admitting: *Deleted

## 2015-08-12 ENCOUNTER — Other Ambulatory Visit: Payer: Self-pay

## 2015-08-12 DIAGNOSIS — C50311 Malignant neoplasm of lower-inner quadrant of right female breast: Secondary | ICD-10-CM

## 2015-08-12 DIAGNOSIS — R252 Cramp and spasm: Secondary | ICD-10-CM

## 2015-08-12 DIAGNOSIS — M25559 Pain in unspecified hip: Secondary | ICD-10-CM

## 2015-08-12 NOTE — Telephone Encounter (Signed)
"  For the past two weeks, I'm having problems when I move a certain way on the right side.  Under my right breast, if I reach up and/or turn, it cramps, spasms and sore.  I stretch to ease it but it hurts so bad I have to stop what I'm doing.  I can't wipe after I urinate, can't reach without having pain under my right breast."   Denies any pulling, lifting or extraneous movements.  Has not tried ibuprofen.  Begins aquatic PT for tendinitis in hip and shoulder at [facility] tomorrow at 3:45 pm.    Return number [phone].

## 2015-08-12 NOTE — Telephone Encounter (Signed)
Spoke with pt regarding her call from this morning.  Assured her that the scheduled PT tomorrow should help with this pain and that the probable cause was her surgery.  Pt states it is fine when she does not move her arm as previously described but she just wanted to make sure it was expected.  Pt informed she could take tylenol or motrin as needed for this pain.  Pt without any further questions or concerns at time of call.

## 2015-08-12 NOTE — Telephone Encounter (Signed)
Called and spoke with patient.  Agree that pain that she is experiencing sounds post-treatment related.  Just saw Dr. Excell Seltzer and had dx mammogram at [facility], which she reports was unremarkable.  Advised to use ibuprofen 400 mg every 8 hours beginning today and proceed with PT tomorrow.  Pt with appointment for Survivorship care plan review on Friday 08/15/15 with this provider.  Will re-assess at that time.  Advised to call with further problems or questions.

## 2015-08-13 ENCOUNTER — Ambulatory Visit: Payer: BLUE CROSS/BLUE SHIELD | Attending: Hematology and Oncology | Admitting: Physical Therapy

## 2015-08-13 ENCOUNTER — Encounter: Payer: Self-pay | Admitting: *Deleted

## 2015-08-13 DIAGNOSIS — R2689 Other abnormalities of gait and mobility: Secondary | ICD-10-CM | POA: Diagnosis present

## 2015-08-13 DIAGNOSIS — M25551 Pain in right hip: Secondary | ICD-10-CM | POA: Diagnosis present

## 2015-08-13 DIAGNOSIS — M25511 Pain in right shoulder: Secondary | ICD-10-CM | POA: Insufficient documentation

## 2015-08-13 DIAGNOSIS — R279 Unspecified lack of coordination: Secondary | ICD-10-CM | POA: Diagnosis present

## 2015-08-14 NOTE — Therapy (Addendum)
[place] MAIN [facility] SERVICES [address] [place], [place], [address] Phone: [phone]   Fax:  [phone]  Physical Therapy Evaluation  Patient Details  Name: FALYN LUYSTER MRN: [id number] Date of Birth: 1971-03-15 Referring Provider: Lindi Adie  Encounter Date: 08/13/2015      PT End of Session - 08/19/15 0837    Visit Number 1   Number of Visits 12   Date for PT Re-Evaluation 11/05/15   PT Start Time 1600   PT Stop Time 1700   PT Time Calculation (min) 60 min   Activity Tolerance Patient tolerated treatment well   Behavior During Therapy [facility] for tasks assessed/performed      Past Medical History  Diagnosis Date  . Hypertension   . Breast cancer, right breast ([place])   . Radiation 10/30/14-12/17/14    right breast 50.4 gray    Past Surgical History  Procedure Laterality Date  . Bunion surgrery    . Carpel tunnel    . Breast biopsy  05/16/14  . Cholecystectomy  2015  . Portacath placement Left 06/18/2014    Procedure: INSERTION PORT-A-CATH;  Surgeon: Excell Seltzer, MD;  Location: [place];  Service: General;  Laterality: Left;    There were no vitals filed for this visit.       Subjective Assessment - 08/13/15 1603    Subjective 1) B hip pain (R worse than L) Pt reported she has completed her cancer Tx in Mar 2017 (surgery, chemo, radiation). Since she started taking Tamoxifan, she noticed her R UE and R hip started hurting. Pt has not been able to sit on it in certain ways. Pt has underwent cortisone shot in herarm and prednisone pills for her hip. These Tx helped but she has begun to feel both hips to hurt again. Putting weight on her R hip in seating and laying, walking 5 blocks, standing > 30 min 6/10. Non-radiating pain.   2) Soreness in B arm  (R > L). Cortisone shot helped increase ROM and lifting purse. She has regained her ROM but she still has difficulty with cleaning and wiping back and  buttocks.   3) fatigue: pt feels her energy has decreased.  Pt feels exhausted after walking 5 blocks20 min at slow speed compared being able to speed walk for 15 mins without fatgiue prior breast CA Dx.  Pt also reported she sleeps 5 hrs a day and stays up late watching TV.        Pertinent History Diagnosed with right breast cancer in February 2016; had lumpectomy June 18, 2014 with 4-5 lymph nodes removed (all negative).  Adjuvant chemotherapy (still on herceptin) and radiation, completed August 2016.  Had superficial blood clot in right leg that caused swelling, so was put on Lasix. ON tamoxifen.    Patient Stated Goals move better, return soft ball playing, walk and exercise longer             [facility] PT Assessment - 08/19/15 0812    Assessment   Medical Diagnosis hip pain   Referring Provider [place]   Precautions   Precautions None   Restrictions   Weight Bearing Restrictions No   Balance Screen   Has the patient fallen in the past 6 months No   Observation/Other Assessments   Other Surveys  --  FSS 70%, LEFS 73%    AROM   Overall AROM Comments functional reach behind head/back: B but w/ p! due to mm spasms on  R chest/axilla region. less p! w/ reaching behind buttock R rotation w/ cue to lengthen spine prior to twisting   Strength   Overall Strength Comments B LE grossly 4/5    Ambulation/Gait   Pre-Gait Activities antalgic gait   Gait Comments 1.12 m/s                    [facility] Adult PT Treatment/Exercise - 08/19/15 0812    Therapeutic Activites    Therapeutic Activities --   Neuro Re-ed    Neuro Re-ed Details  see pt instructions   Exercises   Exercises --  see pt instructions                PT Education - 08/19/15 0837    Education provided Yes   Education Details HEP, POC, anatomy/physiology, goals   Person(s) Educated Patient   Methods Explanation;Demonstration;Tactile cues;Verbal cues;Handout   Comprehension Returned  demonstration;Verbalized understanding             PT Long Term Goals - 08/19/15 0853    PT LONG TERM GOAL #1   Title Pt will report being able to walk 5 blocks without pain in order to exercise.   Time 12   Period Weeks   Status New   PT LONG TERM GOAL #2   Title Pt will have increased functional ROM with RUE for wiping and cleaning back and bottom.    Time 12   Period Weeks   Status New   PT LONG TERM GOAL #3   Title Pt will be able to lay on her R side without interruption of pain in order to increase sleep quality.    Time 12   Period Weeks   Status New   PT LONG TERM GOAL #4   Title Pt will decrease her Fatigue Severity Scale from 70% to < 50% in order to perform her ADLs, walk and exercise.   Time 12   Period Weeks   Status New   PT LONG TERM GOAL #5   Title Pt will increase her LEFS score from 73% to  80% in order to play sports, walk, and squat.                Plan - 08/19/15 0839    Clinical Impression Statement Pt is a 45 yo female who c/o B hip pain (more pain R > L) , mm soreness in UE (more pain R > L), and fatigue following completion of breast CA treatments (chemo/radiation) and current treatment of Tamoxifen. These deficits impact her ability to walk, play softball, sleep, and clean her back and buttocks. Pt's clinical presentations include pain and mm spasms with functional movements, decreased strength, and antalgic/slowed gait speed.      Rehab Potential Excellent   Clinical Impairments Affecting Rehab Potential none    PT Frequency 2x / week   PT Duration 12 weeks   PT Treatment/Interventions ADLs/Self Care Home Management;Aquatic Therapy;Cryotherapy;Balance training;Neuromuscular re-education;Gait training;Therapeutic exercise;Therapeutic activities;Functional mobility training;Stair training;Patient/family education;Manual techniques;Taping;Energy conservation;Scar mobilization   PT Next Visit Plan --   Consulted and Agree with Plan of Care  Patient      Patient will benefit from skilled therapeutic intervention in order to improve the following deficits and impairments:  Decreased range of motion, Decreased strength, Impaired UE functional use, Hypomobility, Abnormal gait, Decreased activity tolerance, Improper body mechanics, Pain, Postural dysfunction, Increased muscle spasms, Decreased safety awareness, Decreased coordination, Decreased endurance, Decreased scar mobility  Visit Diagnosis: Pain in  right hip  Pain in right shoulder  Other abnormalities of gait and mobility  Unspecified lack of coordination     Problem List Patient Active Problem List   Diagnosis Date Noted  . Obesity (BMI 35.0-39.9 without comorbidity) ([place]) 03/20/2015  . Leg edema, right 10/03/2014  . Genetic testing 07/10/2014  . Breast cancer of lower-inner quadrant of right female breast ([place]) 05/29/2014    [name] ,PT, DPT, E-RYT  08/19/2015, 8:51 AM  [place] MAIN [facility] SERVICES [address] [place], [place], [address] Phone: [phone]   Fax:  [phone]  Name: NYKOLE BEIL MRN: [id number] Date of Birth: January 06, 1971

## 2015-08-15 ENCOUNTER — Encounter: Payer: Self-pay | Admitting: Nurse Practitioner

## 2015-08-15 ENCOUNTER — Other Ambulatory Visit (HOSPITAL_BASED_OUTPATIENT_CLINIC_OR_DEPARTMENT_OTHER): Payer: BLUE CROSS/BLUE SHIELD

## 2015-08-15 ENCOUNTER — Ambulatory Visit (HOSPITAL_BASED_OUTPATIENT_CLINIC_OR_DEPARTMENT_OTHER): Payer: BLUE CROSS/BLUE SHIELD | Admitting: Nurse Practitioner

## 2015-08-15 VITALS — BP 156/85 | HR 81 | Temp 98.6°F | Resp 18 | Ht 65.0 in | Wt 264.7 lb

## 2015-08-15 DIAGNOSIS — N951 Menopausal and female climacteric states: Secondary | ICD-10-CM

## 2015-08-15 DIAGNOSIS — R252 Cramp and spasm: Secondary | ICD-10-CM | POA: Diagnosis not present

## 2015-08-15 DIAGNOSIS — C50311 Malignant neoplasm of lower-inner quadrant of right female breast: Secondary | ICD-10-CM | POA: Diagnosis not present

## 2015-08-15 DIAGNOSIS — Z170 Estrogen receptor positive status [ER+]: Secondary | ICD-10-CM

## 2015-08-15 DIAGNOSIS — Z95828 Presence of other vascular implants and grafts: Secondary | ICD-10-CM

## 2015-08-15 LAB — BASIC METABOLIC PANEL WITH GFR
Anion Gap: 8 meq/L (ref 3–11)
Calcium: 9 mg/dL (ref 8.4–10.4)
EGFR: 87 ml/min/1.73 m2 — ABNORMAL LOW (ref >90)
Glucose: 92 mg/dL (ref 70–140)
Sodium: 142 meq/L (ref 136–145)

## 2015-08-15 LAB — BASIC METABOLIC PANEL
BUN: 12.9 mg/dL (ref 7.0–26.0)
CO2: 28 mEq/L (ref 22–29)
Chloride: 106 mEq/L (ref 98–109)
Creatinine: 0.9 mg/dL (ref 0.6–1.1)
Potassium: 3.7 mEq/L (ref 3.5–5.1)

## 2015-08-15 MED ORDER — SODIUM CHLORIDE 0.9% FLUSH
10.0000 mL | INTRAVENOUS | Status: DC | PRN
  Administered 2015-08-15: 10 mL via INTRAVENOUS
  Filled 2015-08-15: qty 10

## 2015-08-15 MED ORDER — HEPARIN SOD (PORK) LOCK FLUSH 100 UNIT/ML IV SOLN
500.0000 [IU] | Freq: Once | INTRAVENOUS | Status: AC
  Administered 2015-08-15: 500 [IU] via INTRAVENOUS
  Filled 2015-08-15: qty 5

## 2015-08-15 NOTE — Progress Notes (Signed)
CLINIC:  Cancer Survivorship   REASON FOR VISIT:  Routine follow-up post-treatment for a recent history of breast cancer.  BRIEF ONCOLOGIC HISTORY:    Breast cancer of lower-inner quadrant of right female breast ([place])   05/14/2014 Mammogram Right mammogram: 1.5 cm round mass in right breast at 3:00   05/14/2014 Breast US Right breast: irregular, hypoechoic mass with indistinct margins at 3:00 mearying 1.1 x 0.8 x 1.3 cm.   05/16/2014 Initial Biopsy Ultrasound-guided biopsy: Invasive ductal carcinoma with DCIS, grade 1-2, ER/PR positive, Ki-67 43%, HER-2 positive   05/28/2014 Breast MRI 1.4 x 1.2 x 1.0 cm biopsy-proven invasive ductal carcinoma and ductal carcinoma in situ in the lower inner quadrant of the right breast.    05/28/2014 Clinical Stage Stage IA: T1c N0   06/13/2014 Procedure Breast/Next: Variant of Unknown Significance in the ATM gene called p.4477C>T, p.L1493F. Otherwise, no clinically significant mutation at ATM, BARD1, BRCA1, BRCA2, BRIP1, CDH1, CHEK2, MRE11A, MUTYH, NBN, NF1, PALB2, PTEN, RAD50, RAD51C, RAD51D, and TP53   06/18/2014 Surgery Right breast lumpectomy: Invasive ductal carcinoma 1.3 cm negative for LVI; DCIS 1 mm from margins, 1 sentinel node negative, grade 3 ER 95%, PR 77%, HER-2 amplified ratio 8.25, Ki-67 43%   06/18/2014 Pathologic Stage Stage IA: T1c N0 M0   07/18/2014 - 10/03/2014 Chemotherapy Abraxane Herceptin weekly 12 followed by Herceptin maintenance every 3 weeks to complete one year   10/30/2014 - 12/17/2014 Radiation Therapy Adjuvant radiation therapy: Right breast 50.4 gray in 28 fractions, lumpectomy cavity boost 12 gray in 6 fractions   01/01/2015 -  Anti-estrogen oral therapy Tamoxifen 20 mg daily    INTERVAL HISTORY:  Ms. Pippins presents to the [facility] today for our initial meeting to review her survivorship care plan detailing her treatment course for breast cancer, as well as monitoring long-term side effects of that treatment, education  regarding health maintenance, screening, and overall wellness and health promotion.     Overall, Ms. Croker reports feeling fairly well since completing her radiation therapy approximately six months ago.  She remains fatigued but the skin changes overlying her breast are much improved. She had a hard time deciding to start the tamoxifen therapy as she was concerned about the side effects.  After discussion with Dr. Lindi Adie, she ultimately decided to initiate therapy in mid-October.  Since that time, she has had some hot flashes.  When questioned, she is uncertain how many she has a day.  She states that they awaken her at night. She has not lost any hair, which was one of her concerns. She also complains of extensive joint pain and tendonitis, for which she is currently completing physical therapy.  She is to begin aquatic PT next week at [facility]. She has been doing her stretching exercises and breathing techniques taught to her to her in PT. She also has some chest wall pain / cramping that she has developed since surgery.  She feels that her muscles are tight.  Her physical therapist has advised that she has significant scarring, which is contributing to her pain. She called earlier this week and spoke to this provider who advised her to try some ibuprofen, which she states has helped a little.  She denies headache, cough, shortness of breath or other pain.  She denies any mass or lesion in her breast. She has a good appetite and denies weight loss.  She is concerned about her weight gain which has increased since treatment.  She also reports a period of "  blues" that she experienced for about a month and a half after starting the tamoxifen. This has resolved. Ms. Martorano continues with intermittent bilateral leg cramping without swelling.  She states that she is trying to eat bananas and such to help with her electrolytes, but she continues on her diuretics.  REVIEW OF SYSTEMS:  General: Hot flashes and fatigue.  Denies fever, chills, unintentional weight loss, or night sweats.  HEENT: Denies visual changes, hearing loss, mouth sores or difficulty swallowing. Cardiac: Denies palpitations, chest pain, and lower extremity edema.  Respiratory: Denies wheeze or dyspnea on exertion.  Breast: Denies any new nodularity, masses, tenderness, nipple changes, or nipple discharge.  GI: Denies abdominal pain, constipation, diarrhea, nausea, or vomiting.  GU: Denies dysuria, hematuria, vaginal bleeding, vaginal discharge, or vaginal dryness.  Musculoskeletal: As above.  Neuro: Denies recent fall or numbness / tingling in her extremities. Skin: Denies rash, pruritis, or open wounds.  Psych: As above.   A 14-point review of systems was completed and was negative, except as noted above.   ONCOLOGY TREATMENT TEAM:  1. Surgeon:  Dr. Excell Seltzer at [facility]  2. Medical Oncologist: Dr. Lindi Adie 3. Radiation Oncologist: Dr. Sondra Come    PAST MEDICAL/SURGICAL HISTORY:  Past Medical History  Diagnosis Date  . Hypertension   . Breast cancer, right breast ([place])   . Radiation 10/30/14-12/17/14    right breast 50.4 gray   Past Surgical History  Procedure Laterality Date  . Bunion surgrery    . Carpel tunnel    . Breast biopsy  05/16/14  . Cholecystectomy  2015  . Portacath placement Left 06/18/2014    Procedure: INSERTION PORT-A-CATH;  Surgeon: Excell Seltzer, MD;  Location: [place];  Service: General;  Laterality: Left;     ALLERGIES:  No Known Allergies   CURRENT MEDICATIONS:  Current Outpatient Prescriptions on File Prior to Visit  Medication Sig Dispense Refill  . chlorpheniramine-HYDROcodone (TUSSIONEX) 10-8 MG/5ML LQCR Take 5 mLs by mouth every 12 (twelve) hours as needed for cough.   0  . Eluxadoline (VIBERZI) 100 MG TABS Take 1 tablet by mouth 2 (two) times daily. 60 tablet 5  . fluticasone (FLONASE) 50 MCG/ACT nasal spray SPRAY 2 SPRAYS IN EACH NOSTRILS  2  . furosemide  (LASIX) 40 MG tablet TAKE 1 TABLET BY MOUTH EVERY DAY AS NEEDED 90 tablet 0  . hydrochlorothiazide (HYDRODIURIL) 25 MG tablet Take 25 mg by mouth daily.    [name] lidocaine-prilocaine (EMLA) cream Apply 1 application topically as needed. 30 g 0  . RESTASIS 0.05 % ophthalmic emulsion INSTILL ONE DROP IN AFFECTED EYE 2 TIMES A DAY  4  . tamoxifen (NOLVADEX) 20 MG tablet Take 1 tablet (20 mg total) by mouth daily. 90 tablet 3  . valACYclovir (VALTREX) 500 MG tablet      No current facility-administered medications on file prior to visit.     ONCOLOGIC FAMILY HISTORY:  Family History  Problem Relation Age of Onset  . Kidney cancer Father     currently 51  . Prostate cancer Maternal Uncle     deceased 25  . Lung cancer Maternal Grandfather     deceased 39; smoker  . Colon cancer Paternal Grandfather     deceased 110s  . Cancer Other     pat half-sister; ? uterine vs. ovarian ca     GENETIC COUNSELING/TESTING: Yes, performed 06/13/2014: Breast/Nex panelt: Variant of Unknown Significance in the ATM gene called p.4477C>T, p.L1493F. Otherwise, no clinically significant mutation  at ATM, BARD1, BRCA1, BRCA2, BRIP1, CDH1, CHEK2, MRE11A, MUTYH, NBN, NF1, PALB2, PTEN, RAD50, RAD51C, RAD51D, and TP53   SOCIAL HISTORY:  KATERI BALCH is divorced and lives alone in [place], [place].  She has no children. Ms. Sedgwick is currently on long term disability having worked previously  at [employer].  She denies any current or history of tobacco or illicit drug use.  She uses alcohol on a social basis.   PHYSICAL EXAMINATION:  Vital Signs: Filed Vitals:   08/15/15 1025  BP: 156/85  Pulse: 81  Temp: 98.6 F (37 C)  Resp: 18   ECOG Performance Status: 0  General: Well-nourished, well-appearing female in no acute distress.  She is unaccompanied in clinic today.   HEENT: Head is atraumatic and normocephalic.  Pupils equal and reactive to light and accomodation. Conjunctivae clear without exudate.   Sclerae anicteric. Oral mucosa is pink, moist, and intact without lesions.  Oropharynx is pink without lesions or erythema.  Lymph: No cervical, supraclavicular, infraclavicular, or axillary lymphadenopathy noted on palpation.  Cardiovascular: Regular rate and rhythm without murmurs, rubs, or gallops. Respiratory: Clear to auscultation bilaterally. Chest expansion symmetric without accessory muscle use on inspiration or expiration.  GI: Abdomen soft and round. No tenderness to palpation. Bowel sounds normoactive in 4 quadrants. GU: Deferred.  Neuro: No focal deficits. Steady gait.  Psych: Mood and affect normal and appropriate for situation.  Extremities: No edema, cyanosis, or clubbing. No swelling.  Skin: Warm and dry. No open lesions noted.   LABORATORY DATA:  None for this visit.  DIAGNOSTIC IMAGING:  None for this visit.     ASSESSMENT AND PLAN:   1. Breast cancer: Stage IA invasive ductal carcinoma of the right breast (04/2014), ER positive, PR positive, HER2/neu positive, S/P lumpectomy/SLNB (06/2014) followed by adjuvant nab-paclitaxel and trastuzumab x 12 followed by maintenance trastuzumab (completed 09/2014), followed by adjuvant radiation therapy to the right breast completed 11/2014 with tamoxifen begun 01/2015.  Ms. Biehler is doing well without clinical symptoms worrisome for disease recurrence. She will follow-up with her medical oncologist,  Dr. Lindi Adie, in May 2017 with history and physical examination per surveillance protocol.  She will continue her anti-estrogen therapy with tamoxifen at this time. She was instructed to make [place] aware if she begins to experience any new or increased side effects of the medication. Though the incidence is low, there is an associated risk of endometrial cancer with anti-estrogen therapies like Tamoxifen.  Ms. Creek was encouraged to contact us with any vaginal bleeding while taking Tamoxifen. Of note, her menstrual cycles have not returned post  chemotherapy.  Other side effects of Tamoxifen were again reviewed with her as well. A comprehensive survivorship care plan and treatment summary was reviewed with the patient today detailing her breast cancer diagnosis, treatment course, potential late/long-term effects of treatment, appropriate follow-up care with recommendations for the future, and patient education resources.  A copy of this summary, along with a letter will be sent to the patient's primary care provider via in basket message after today's visit.  Ms. Nachreiner is welcome to return to the Survivorship Clinic in the future, as needed; no follow-up will be scheduled at this time.  I have strongly advised her that if the depression / anxiety returns that she notifies [place] ASAP. As she has not had her port a cath flushed since completing chemotherapy, we have flushed her port today.  She is interested in having it removed by Dr. Excell Seltzer and will discuss  this further with Dr. Lindi Adie at her upcoming appointment.  2. Hot flashes: Ms. Peitz continues hot flashes which she states are bearable at this time.  She is uncertain how many a day she is having and I have asked her to keep a journal before she returns to see Dr. Lindi Adie.  We discussed various environmental and dietary factors that can cotntirbute to the hot flashes, as well as the potential of pharmacologic therapy to minimize their occurrence.  She will discuss this further with Dr. Lindi Adie at her appointment in a few weeks.  3. Leg cramps: Based on Ms. Broyles' complaints of bilateral leg cramps and history of Lasix / HCTZ use, we checked a BMP today.  Further disposition pending those results.  4. Cancer screening:  Due to Ms. Maino's history and her age, she should receive screening for skin cancers, colon cancer (beginning at age 41), and gynecologic cancers.  The information and recommendations are listed on the patient's comprehensive care plan/treatment summary and were reviewed in detail  with the patient.    5. Health maintenance and wellness promotion: Ms. Kindel was encouraged to consume 5-7 servings of fruits and vegetables per day. We reviewed the "Nutrition Rainbow" handout, as well as discussed recommendations to maximize nutrition and minimize recurrence, such as increased intake of fruits, vegetables, lean proteins, and minimizing the intake of red meats and processed foods. Regarding her weight gain and overall wellness, she was also encouraged to engage in moderate to vigorous exercise for 30 minutes per day most days of the week. We discussed the [employer] fitness program, which is designed for cancer survivors to help them become more physically fit after cancer treatments. She will call Elaina Hoops to determine whether her [employer] [facility] [place] has a program. She was instructed to limit her alcohol consumption and continue to abstain from tobacco use.  A copy of the "Take Control of Your Health" brochure was given to her reinforcing these recommendations.   6. Support services/counseling: It is not uncommon for this period of the patient's cancer care trajectory to be one of many emotions and stressors.  We discussed an opportunity for her to participate in the next session of [facility] ("Finding Your New Normal") support group series designed for patients after they have completed treatment.  Ms. Wickwire was encouraged to take advantage of our many other support services programs, support groups, and/or counseling in coping with her new life as a cancer survivor after completing anti-cancer treatment. She was offered support today through active listening and expressive supportive counseling.  She was given information regarding our available services and encouraged to contact me with any questions or for help enrolling in any of our support group/programs.    A total of 50 minutes of face-to-face time was spent with this patient with greater than 50% of that time in counseling and  care-coordination.   Sylvan Cheese, NP  Survivorship Program [facility] [phone]   Note: PRIMARY CARE PROVIDER Albina Billet, [place] [phone]

## 2015-08-15 NOTE — Progress Notes (Signed)
TC to pt to advise BMP was normal. Pt verbalized an understanding and thanked [place] for the call.

## 2015-08-19 ENCOUNTER — Ambulatory Visit: Payer: BLUE CROSS/BLUE SHIELD | Attending: Hematology and Oncology | Admitting: Physical Therapy

## 2015-08-19 DIAGNOSIS — R279 Unspecified lack of coordination: Secondary | ICD-10-CM

## 2015-08-19 DIAGNOSIS — M25511 Pain in right shoulder: Secondary | ICD-10-CM | POA: Insufficient documentation

## 2015-08-19 DIAGNOSIS — R2689 Other abnormalities of gait and mobility: Secondary | ICD-10-CM | POA: Insufficient documentation

## 2015-08-19 DIAGNOSIS — I890 Lymphedema, not elsewhere classified: Secondary | ICD-10-CM | POA: Insufficient documentation

## 2015-08-19 DIAGNOSIS — M25551 Pain in right hip: Secondary | ICD-10-CM | POA: Insufficient documentation

## 2015-08-19 NOTE — Patient Instructions (Addendum)
For stretching and for reaching behind buttocks for self-cleaning Seated twisting:  Inhale, lengthen spine before twisting at the navel, then the chest, then neck 10 x 3 x day  Elbow circles from center, circling outward (hands on shoulders) 10 x 3 x/ day   Energy conservation: to manage fatigue: When returning to softball playing, make sure to play 20 min-30 min increments with 10-15 min rest breaks to avoid overfatigue

## 2015-08-19 NOTE — Addendum Note (Signed)
Addended by: [name] on: 08/19/2015 09:21 AM   Modules accepted: Orders

## 2015-08-20 NOTE — Therapy (Signed)
[place] MAIN [facility] SERVICES [address] [place], [place], [address] Phone: [phone]   Fax:  [phone]  Physical Therapy Treatment  Patient Details  Name: Krystal Kelley MRN: [id number] Date of Birth: [birth year] Referring Provider: Lindi Adie  Encounter Date: 08/19/2015      PT End of Session - 08/20/15 2228    Visit Number 2   Number of Visits 12   Date for PT Re-Evaluation 11/05/15   PT Start Time 1100   PT Stop Time 1145   PT Time Calculation (min) 45 min   Activity Tolerance Patient tolerated treatment well   Behavior During Therapy [facility] for tasks assessed/performed      Past Medical History  Diagnosis Date  . Hypertension   . Breast cancer, right breast ([place])   . Radiation 10/30/14-12/17/14    right breast 50.4 gray    Past Surgical History  Procedure Laterality Date  . Bunion surgrery    . Carpel tunnel    . Breast biopsy  05/16/14  . Cholecystectomy  2015  . Portacath placement Left 06/18/2014    Procedure: INSERTION PORT-A-CATH;  Surgeon: Excell Seltzer, MD;  Location: [place];  Service: General;  Laterality: Left;    There were no vitals filed for this visit.      Subjective Assessment - 08/20/15 2226    Subjective Pt reported she has had many events occur with her family and got mized on the time for today's session. Pt reported no complaints following last session and she was able to play softball for 20-30 min without any issues.     Pertinent History Diagnosed with right breast cancer in February 2016; had lumpectomy June 18, 2014 with 4-5 lymph nodes removed (all negative).  Adjuvant chemotherapy (still on herceptin) and radiation, completed August 2016.  Had superficial blood clot in right leg that caused swelling, so was put on Lasix. ON tamoxifen.    Patient Stated Goals move better, return soft ball playing, walk and exercise longer                      Adult Aquatic  Therapy - 08/20/15 2227    Aquatic Therapy Subjective   Subjective Pt had no complaints during/after the session      O: Pt entered/exited the pool via steps with single UE support on rail. Exercises performed in 3'6" depth  50 ft =1 lap  Supine floating on noodles:with PT supporting and carrying pt for neck support  4 laps shoulder/ hip abd/add  30 sec x 2 supine chest rows with feet stationary under rail, hip/ knee flexed  Exercises in 4'6" depth 2 laps. 1 noodle at waist held by PT, modified breast stroke  10 x 2 reps  D2 Ext BUE each   Supine relaxation 5 mins on noodles, slowly dragged by PT                       PT Long Term Goals - 08/19/15 0853    PT LONG TERM GOAL #1   Title Pt will report being able to walk 5 blocks without pain in order to exercise.   Time 12   Period Weeks   Status New   PT LONG TERM GOAL #2   Title Pt will have increased functional ROM with RUE for wiping and cleaning back and bottom.    Time 12   Period Weeks   Status New  PT LONG TERM GOAL #3   Title Pt will be able to lay on her R side without interruption of pain in order to increase sleep quality.    Time 12   Period Weeks   Status New   PT LONG TERM GOAL #4   Title Pt will decrease her Fatigue Severity Scale from 70% to < 50% in order to perform her ADLs, walk and exercise.   Time 12   Period Weeks   Status New   PT LONG TERM GOAL #5   Title Pt will increase her LEFS score from 73% to  80% in order to play sports, walk, and squat.            Long Term Clinic Goals - 07/24/15 1159    CC Long Term Goal  #1   Title Right shoulder active flexion to at least 165 degrees.   Status Achieved   CC Long Term Goal  #2   Title Right shoulder active abduction to at least 180 degrees.   Status Achieved   CC Long Term Goal  #3   Title Patient will be independent in HEP including strengthening for right shoulder, and in safe self-progression.   Status Achieved   CC  Long Term Goal  #4   Title Pt. will report perception of at least 30% improvement in functional strength in right shoulder.   Status Achieved            Plan - 08/20/15 2228    Clinical Impression Statement Pt had no complaints of mm spasms nor pain with session today. Initiated UE/ LE hip abd/add in supine position on noodles, modified breast stroke, cross-diagonals of BUE, core strengthening exercises. Pt performed correctly. Pt was educated on ensuring proper nutrition/ eating prior to session because pt expressed she had no had breakfast. Pt was also educated on energy conservation principles. Pt voiced understanding. Pt will continue to benefit from skilled PT.    Rehab Potential Excellent   Clinical Impairments Affecting Rehab Potential none    PT Frequency 2x / week   PT Duration 12 weeks   PT Treatment/Interventions ADLs/Self Care Home Management;Aquatic Therapy;Cryotherapy;Balance training;Neuromuscular re-education;Gait training;Therapeutic exercise;Therapeutic activities;Functional mobility training;Stair training;Patient/family education;Manual techniques;Taping;Energy conservation;Scar mobilization   Consulted and Agree with Plan of Care Patient      Patient will benefit from skilled therapeutic intervention in order to improve the following deficits and impairments:  Decreased range of motion, Decreased strength, Impaired UE functional use, Hypomobility, Abnormal gait, Decreased activity tolerance, Improper body mechanics, Pain, Postural dysfunction, Increased muscle spasms, Decreased safety awareness, Decreased coordination, Decreased endurance, Decreased scar mobility  Visit Diagnosis: Pain in right hip  Pain in right shoulder  Other abnormalities of gait and mobility  Unspecified lack of coordination     Problem List Patient Active Problem List   Diagnosis Date Noted  . Obesity (BMI 35.0-39.9 without comorbidity) ([place]) 03/20/2015  . Leg edema, right 10/03/2014   . Genetic testing 07/10/2014  . Breast cancer of lower-inner quadrant of right female breast ([place]) 05/29/2014    [name] ,PT, DPT, E-RYT  08/20/2015, 10:31 PM  [place] MAIN [facility] SERVICES [address] [place], [place], [address] Phone: [phone]   Fax:  [phone]  Name: LASHEA RUNGE MRN: [id number] Date of Birth: April 15, 1971

## 2015-08-21 ENCOUNTER — Ambulatory Visit: Payer: BLUE CROSS/BLUE SHIELD | Admitting: Physical Therapy

## 2015-08-21 DIAGNOSIS — R279 Unspecified lack of coordination: Secondary | ICD-10-CM

## 2015-08-21 DIAGNOSIS — M25551 Pain in right hip: Secondary | ICD-10-CM

## 2015-08-21 DIAGNOSIS — R2689 Other abnormalities of gait and mobility: Secondary | ICD-10-CM

## 2015-08-21 DIAGNOSIS — M25511 Pain in right shoulder: Secondary | ICD-10-CM

## 2015-08-22 NOTE — Therapy (Addendum)
[place] MAIN [facility] SERVICES [address] [place], [place], [address] Phone: [phone]   Fax:  [phone]  Physical Therapy Treatment  Patient Details  Name: Krystal Kelley MRN: [id number] Date of Birth: January 17, 1971 Referring Provider: Lindi Adie  Encounter Date: 08/21/2015      PT End of Session - 08/22/15 1837    Visit Number 3   Number of Visits 12   Date for PT Re-Evaluation 11/05/15   PT Start Time [id number]   PT Stop Time 1120   PT Time Calculation (min) 45 min   Activity Tolerance Patient tolerated treatment well   Behavior During Therapy [facility] for tasks assessed/performed      Past Medical History  Diagnosis Date  . Hypertension   . Breast cancer, right breast ([place])   . Radiation 10/30/14-12/17/14    right breast 50.4 gray    Past Surgical History  Procedure Laterality Date  . Bunion surgrery    . Carpel tunnel    . Breast biopsy  05/16/14  . Cholecystectomy  2015  . Portacath placement Left 06/18/2014    Procedure: INSERTION PORT-A-CATH;  Surgeon: Excell Seltzer, MD;  Location: [place];  Service: General;  Laterality: Left;    There were no vitals filed for this visit.      Subjective Assessment - 08/22/15 1836    Subjective Pt reported she had no complaints nor fatigue after last aquatic session.   Pertinent History Diagnosed with right breast cancer in February 2016; had lumpectomy June 18, 2014 with 4-5 lymph nodes removed (all negative).  Adjuvant chemotherapy (still on herceptin) and radiation, completed August 2016.  Had superficial blood clot in right leg that caused swelling, so was put on Lasix. ON tamoxifen.    Patient Stated Goals move better, return soft ball playing, walk and exercise longer                      Adult Aquatic Therapy - 08/22/15 1837    Aquatic Therapy Subjective   Subjective Pt had no complaints during/after the session     O: O: Pt entered/exited the pool  via steps with single UE support on rail. Exercises performed in 3'6" depth  50 ft =1 lap  6 laps walking 4 laps walking with dumbbells pressed by waist for shoulder depression/retraction 4 laps side stepping with cues for decreased upper trap elevation D2 extension 10 x each UE 2 laps Breast stroke in 4\' 6"   on back supported by noodles and PT: 2 laps flutter kick  2 laps back stroke  2 laps combined UE LE back stroke  1 min threading water 4'6" cued for breast stroke UE, bicycling LE  3 min relaxation on noodles                  PT Long Term Goals - 08/19/15 0853    PT LONG TERM GOAL #1   Title Pt will report being able to walk 5 blocks without pain in order to exercise.   Time 12   Period Weeks   Status New   PT LONG TERM GOAL #2   Title Pt will have increased functional ROM with RUE for wiping and cleaning back and bottom.    Time 12   Period Weeks   Status New   PT LONG TERM GOAL #3   Title Pt will be able to lay on her R side without interruption of pain in order to  increase sleep quality.    Time 12   Period Weeks   Status New   PT LONG TERM GOAL #4   Title Pt will decrease her Fatigue Severity Scale from 70% to < 50% in order to perform her ADLs, walk and exercise.   Time 12   Period Weeks   Status New   PT LONG TERM GOAL #5   Title Pt will increase her LEFS score from 73% to  80% in order to play sports, walk, and squat.             Plan - 08/22/15 1837    Clinical Impression Statement Pt required mild cuing with breast stroke today and did not require a noodle for buoyancy. Pt also advanced to back stroke with PT/noodles supporting her body. Initiated water threading to incorporate short bouts of high intensity which pt tolerated without SOB nor complaints. Pt will continue to benefit from skilled PT.    Rehab Potential Excellent   Clinical Impairments Affecting Rehab Potential none    PT Frequency 2x / week   PT Duration 12 weeks   PT  Treatment/Interventions ADLs/Self Care Home Management;Aquatic Therapy;Cryotherapy;Balance training;Neuromuscular re-education;Gait training;Therapeutic exercise;Therapeutic activities;Functional mobility training;Stair training;Patient/family education;Manual techniques;Taping;Energy conservation;Scar mobilization   Consulted and Agree with Plan of Care Patient      Patient will benefit from skilled therapeutic intervention in order to improve the following deficits and impairments:  Decreased range of motion, Decreased strength, Impaired UE functional use, Hypomobility, Abnormal gait, Decreased activity tolerance, Improper body mechanics, Pain, Postural dysfunction, Increased muscle spasms, Decreased safety awareness, Decreased coordination, Decreased endurance, Decreased scar mobility  Visit Diagnosis: Pain in right hip  Pain in right shoulder  Other abnormalities of gait and mobility  Unspecified lack of coordination     Problem List Patient Active Problem List   Diagnosis Date Noted  . Obesity (BMI 35.0-39.9 without comorbidity) ([place]) 03/20/2015  . Leg edema, right 10/03/2014  . Genetic testing 07/10/2014  . Breast cancer of lower-inner quadrant of right female breast ([place]) 05/29/2014    [name] ,PT, DPT, E-RYT  08/22/2015, 6:42 PM  [place] MAIN [facility] SERVICES [address] [place], [place], [address] Phone: [phone]   Fax:  [phone]  Name: Krystal Kelley MRN: [id number] Date of Birth: 06-06-1970

## 2015-08-26 ENCOUNTER — Ambulatory Visit: Payer: BLUE CROSS/BLUE SHIELD | Admitting: Physical Therapy

## 2015-08-28 ENCOUNTER — Ambulatory Visit: Payer: BLUE CROSS/BLUE SHIELD | Admitting: Physical Therapy

## 2015-08-28 DIAGNOSIS — M25551 Pain in right hip: Secondary | ICD-10-CM

## 2015-08-28 DIAGNOSIS — R2689 Other abnormalities of gait and mobility: Secondary | ICD-10-CM

## 2015-08-28 DIAGNOSIS — R279 Unspecified lack of coordination: Secondary | ICD-10-CM

## 2015-08-28 DIAGNOSIS — M25511 Pain in right shoulder: Secondary | ICD-10-CM

## 2015-08-28 NOTE — Therapy (Signed)
[place] MAIN [facility] SERVICES [address] [place], [place], [address] Phone: [phone]   Fax:  [phone]  Physical Therapy Treatment  Patient Details  Name: Krystal Kelley MRN: [id number] Date of Birth: 16-Jul-1970 Referring Provider: Lindi Adie  Encounter Date: 08/28/2015      PT End of Session - 08/28/15 1052    Visit Number 4   Number of Visits 12   Date for PT Re-Evaluation 11/05/15   PT Start Time 1030   PT Stop Time 1110   PT Time Calculation (min) 40 min   Activity Tolerance Patient tolerated treatment well   Behavior During Therapy [facility] for tasks assessed/performed      Past Medical History  Diagnosis Date  . Hypertension   . Breast cancer, right breast ([place])   . Radiation 10/30/14-12/17/14    right breast 50.4 gray    Past Surgical History  Procedure Laterality Date  . Bunion surgrery    . Carpel tunnel    . Breast biopsy  05/16/14  . Cholecystectomy  2015  . Portacath placement Left 06/18/2014    Procedure: INSERTION PORT-A-CATH;  Surgeon: Excell Seltzer, MD;  Location: [place];  Service: General;  Laterality: Left;    There were no vitals filed for this visit.      Subjective Assessment - 08/28/15 1039    Subjective Pt 's father passed and she felt she needed to skip last appt to rest and attend his funeral. Pt wants to get back to "normal" but she finds herself exhausted after doing lots of activities. Pt acknowledges she overdid it with playing her softball game and felt exhausted for a few days.  Pt feels like her cortisoid shot is wearing off and her R arm feels more tender but can still able to lift . Pt's B hips also hurt and she has difficulty laying on them when sleeping.     Pertinent History Diagnosed with right breast cancer in February 2016; had lumpectomy June 18, 2014 with 4-5 lymph nodes removed (all negative).  Adjuvant chemotherapy (still on herceptin) and radiation, completed August  2016.  Had superficial blood clot in right leg that caused swelling, so was put on Lasix. ON tamoxifen.    Patient Stated Goals move better, return soft ball playing, walk and exercise longer                      Adult Aquatic Therapy - 08/28/15 1052    Aquatic Therapy Subjective   Subjective Pt had no complaints during/after the session      O: O: Pt entered/exited the pool via steps with single UE support on rail. Exercises performed in 3'6" depth  50 ft =1 lap  4 laps: walking  w/ blue pool floor for proprioception  2 laps sidestep + squat with cuing for alignment and deep core mm  L/R , educated application with ADLs (bending, pulling low drawers)   2 laps sidestep + squat with dumbells to push thru water   Stretching break  Forward /back ward step-up on step without UE support 3  Min  Lateral step up and down with dumbells with shoulder abduction for balance 3 min  Floating on noodle 3 min   Relaxation 5'                PT Education - 08/28/15 1120    Education provided Yes   Education Details education on benefit of lymphedema for her  R arm, referral to OT, activity pacing    Person(s) Educated Patient   Methods Explanation;Demonstration;Tactile cues;Verbal cues   Comprehension Returned demonstration;Verbalized understanding             PT Long Term Goals - 08/19/15 0853    PT LONG TERM GOAL #1   Title Pt will report being able to walk 5 blocks without pain in order to exercise.   Time 12   Period Weeks   Status New   PT LONG TERM GOAL #2   Title Pt will have increased functional ROM with RUE for wiping and cleaning back and bottom.    Time 12   Period Weeks   Status New   PT LONG TERM GOAL #3   Title Pt will be able to lay on her R side without interruption of pain in order to increase sleep quality.    Time 12   Period Weeks   Status New   PT LONG TERM GOAL #4   Title Pt will decrease her Fatigue Severity Scale from 70%  to < 50% in order to perform her ADLs, walk and exercise.   Time 12   Period Weeks   Status New   PT LONG TERM GOAL #5   Title Pt will increase her LEFS score from 73% to  80% in order to play sports, walk, and squat.            Long Term Clinic Goals - 07/24/15 1159    CC Long Term Goal  #1   Title Right shoulder active flexion to at least 165 degrees.   Status Achieved   CC Long Term Goal  #2   Title Right shoulder active abduction to at least 180 degrees.   Status Achieved   CC Long Term Goal  #3   Title Patient will be independent in HEP including strengthening for right shoulder, and in safe self-progression.   Status Achieved   CC Long Term Goal  #4   Title Pt. will report perception of at least 30% improvement in functional strength in right shoulder.   Status Achieved            Plan - 08/28/15 1053    Clinical Impression Statement Pt is progressing well with strengthening and ROM exercises through aquatic therapy and education for energy conservation/ activity pacing. Pt complains of her cortisone shot wearing off and she is experiencing UE tenderness but is still able to lift her arm. Pt also complaints of B hip pain when laying on it.  Pt would benefit from a referral for UE lymphema therapy in order to prevent/ minimize UE tenderness. Plan to continue to provide education on proper exercise and progress her strength and aerobic exercises with consideration for wellness as a CA survivor. Pt was educated about utilizing classes at the CA center. Pt voiced understanding.     Rehab Potential Excellent   Clinical Impairments Affecting Rehab Potential none    PT Frequency 2x / week   PT Duration 12 weeks   PT Treatment/Interventions ADLs/Self Care Home Management;Aquatic Therapy;Cryotherapy;Balance training;Neuromuscular re-education;Gait training;Therapeutic exercise;Therapeutic activities;Functional mobility training;Stair training;Patient/family education;Manual  techniques;Taping;Energy conservation;Scar mobilization   Consulted and Agree with Plan of Care Patient      Patient will benefit from skilled therapeutic intervention in order to improve the following deficits and impairments:  Decreased range of motion, Decreased strength, Impaired UE functional use, Hypomobility, Abnormal gait, Decreased activity tolerance, Improper body mechanics, Pain, Postural dysfunction, Increased muscle spasms, Decreased  safety awareness, Decreased coordination, Decreased endurance, Decreased scar mobility  Visit Diagnosis: Pain in right hip  Pain in right shoulder  Other abnormalities of gait and mobility  Unspecified lack of coordination     Problem List Patient Active Problem List   Diagnosis Date Noted  . Obesity (BMI 35.0-39.9 without comorbidity) ([place]) 03/20/2015  . Leg edema, right 10/03/2014  . Genetic testing 07/10/2014  . Breast cancer of lower-inner quadrant of right female breast ([place]) 05/29/2014    [name] ,PT, DPT, E-RYT  08/28/2015, 11:21 AM  [place] MAIN [facility] SERVICES [address] [place], [place], [address] Phone: [phone]   Fax:  [phone]  Name: BONNIELEE RYON MRN: [id number] Date of Birth: 1970/08/20

## 2015-09-02 ENCOUNTER — Ambulatory Visit: Payer: BLUE CROSS/BLUE SHIELD | Admitting: Physical Therapy

## 2015-09-02 DIAGNOSIS — R2689 Other abnormalities of gait and mobility: Secondary | ICD-10-CM

## 2015-09-02 DIAGNOSIS — M25551 Pain in right hip: Secondary | ICD-10-CM | POA: Diagnosis not present

## 2015-09-02 DIAGNOSIS — M25511 Pain in right shoulder: Secondary | ICD-10-CM

## 2015-09-02 DIAGNOSIS — R279 Unspecified lack of coordination: Secondary | ICD-10-CM

## 2015-09-02 NOTE — Therapy (Signed)
[place] MAIN [facility] SERVICES [address] [place], [place], [address] Phone: [phone]   Fax:  [phone]  Physical Therapy Treatment  Patient Details  Name: Krystal Kelley MRN: [id number] Date of Birth: 18-Feb-1971 Referring Provider: Lindi Adie  Encounter Date: 09/02/2015    Past Medical History  Diagnosis Date  . Hypertension   . Breast cancer, right breast ([place])   . Radiation 10/30/14-12/17/14    right breast 50.4 gray    Past Surgical History  Procedure Laterality Date  . Bunion surgrery    . Carpel tunnel    . Breast biopsy  05/16/14  . Cholecystectomy  2015  . Portacath placement Left 06/18/2014    Procedure: INSERTION PORT-A-CATH;  Surgeon: Excell Seltzer, MD;  Location: [place];  Service: General;  Laterality: Left;    There were no vitals filed for this visit.      Subjective Assessment - 09/02/15 0940    Subjective (p) Pt reported she sleeps 3 hours per night and recognizes she "fights sleep".  Pt finds it painful to lay on her sides due to hip pain. Pt has been scheduled with the lymphedema specialist and OTR  at [place] for next week.                         Adult Aquatic Therapy - 09/02/15 1024    Aquatic Therapy Subjective   Subjective Pt had no complaints during/after the session     O:  O: Pt entered/exited the pool via steps with single UE support on rail.  50 ft =1 lap Exercises performed in 3'6" depth  15' gentle movements over noodle while PT provided education about improving sleep quality and reviewing goals, referral to OTR/ lymphedema specialist.   4'6" depth 30" x 2 jogging with 30 " rest in between 30" x 2 touching opposite thigh marching 30" x 2 treading water   2 laps with dumbbells at the side walking    Exercises performed in 3'6" depth  Relaxation over noodles 5'             PT Education - 09/02/15 1025    Education provided Yes   Education Details energy conservation strategies, improving sleep quality education    Person(s) Educated Patient   Methods Explanation   Comprehension Verbalized understanding             PT Long Term Goals - 09/02/15 0912    PT LONG TERM GOAL #1   Title Pt will report being able to walk 5 blocks without pain in order to exercise.   Time 12   Period Weeks   Status Partially Met   PT LONG TERM GOAL #2   Title Pt will have increased functional ROM with RUE for wiping and cleaning back and bottom.    Time 12   Period Weeks   Status Partially Met   PT LONG TERM GOAL #3   Title Pt will be able to lay on her R side without interruption of pain in order to increase sleep quality.    Time 12   Period Weeks   Status On-going   PT LONG TERM GOAL #4   Title Pt will decrease her Fatigue Severity Scale from 70% to < 50% in order to perform her ADLs, walk and exercise.   Time 12   Period Weeks   Status New   PT LONG TERM GOAL #5  Title Pt will increase her LEFS score from 73% to  80% in order to play sports, walk, and squat.    Status On-going           Long Term Clinic Goals - 07/24/15 1159    CC Long Term Goal  #1   Title Right shoulder active flexion to at least 165 degrees.   Status Achieved   CC Long Term Goal  #2   Title Right shoulder active abduction to at least 180 degrees.   Status Achieved   CC Long Term Goal  #3   Title Patient will be independent in HEP including strengthening for right shoulder, and in safe self-progression.   Status Achieved   CC Long Term Goal  #4   Title Pt. will report perception of at least 30% improvement in functional strength in right shoulder.   Status Achieved            Plan - 09/02/15 1025    Clinical Impression Statement Pt reported feeling exhausted after last session.Modified routine today to educate pt on using HIIT with rest breaks, to avoid long endurance workouts.  Also provided pt education on improving sleep ,  motivational interviewing to overcome barriers to sleep hygiene, and the benefits of sleep for immune system and metabolism. Pt will begin appointment with OTR this week. Plan to address sleeping positions to manage hip pain at land appointment in the upcoming weeks. Pt continues to require skilled PT.   Rehab Potential Excellent   Clinical Impairments Affecting Rehab Potential none    PT Frequency 2x / week   PT Duration 12 weeks   PT Treatment/Interventions ADLs/Self Care Home Management;Aquatic Therapy;Cryotherapy;Balance training;Neuromuscular re-education;Gait training;Therapeutic exercise;Therapeutic activities;Functional mobility training;Stair training;Patient/family education;Manual techniques;Taping;Energy conservation;Scar mobilization   Consulted and Agree with Plan of Care Patient      Patient will benefit from skilled therapeutic intervention in order to improve the following deficits and impairments:  Decreased range of motion, Decreased strength, Impaired UE functional use, Hypomobility, Abnormal gait, Decreased activity tolerance, Improper body mechanics, Pain, Postural dysfunction, Increased muscle spasms, Decreased safety awareness, Decreased coordination, Decreased endurance, Decreased scar mobility  Visit Diagnosis: Pain in right hip  Pain in right shoulder  Other abnormalities of gait and mobility  Unspecified lack of coordination     Problem List Patient Active Problem List   Diagnosis Date Noted  . Obesity (BMI 35.0-39.9 without comorbidity) ([place]) 03/20/2015  . Leg edema, right 10/03/2014  . Genetic testing 07/10/2014  . Breast cancer of lower-inner quadrant of right female breast ([place]) 05/29/2014    [name] ,PT, DPT, E-RYT  09/02/2015, 10:26 AM  [place] MAIN [facility] SERVICES [address] [place], [place], [address] Phone: [phone]   Fax:  [phone]  Name: SALEEMAH MOLLENHAUER MRN: [id number] Date of  Birth: [birth year]

## 2015-09-03 ENCOUNTER — Ambulatory Visit: Payer: BLUE CROSS/BLUE SHIELD | Admitting: Podiatry

## 2015-09-04 ENCOUNTER — Ambulatory Visit: Payer: BLUE CROSS/BLUE SHIELD | Admitting: Physical Therapy

## 2015-09-04 ENCOUNTER — Ambulatory Visit: Payer: BLUE CROSS/BLUE SHIELD | Admitting: Occupational Therapy

## 2015-09-04 DIAGNOSIS — M25551 Pain in right hip: Secondary | ICD-10-CM | POA: Diagnosis not present

## 2015-09-04 DIAGNOSIS — I890 Lymphedema, not elsewhere classified: Secondary | ICD-10-CM

## 2015-09-04 DIAGNOSIS — R279 Unspecified lack of coordination: Secondary | ICD-10-CM

## 2015-09-04 DIAGNOSIS — M25511 Pain in right shoulder: Secondary | ICD-10-CM

## 2015-09-04 DIAGNOSIS — R2689 Other abnormalities of gait and mobility: Secondary | ICD-10-CM

## 2015-09-04 NOTE — Patient Instructions (Signed)
Pt to get measured for Jovipak breast pad - mastectomy one and wear at night time  And as needed during day And sleeve with glove for high risk activities - pt ed on Hand out provided for lymphedema education and precautions

## 2015-09-04 NOTE — Therapy (Signed)
[place] MAIN [facility] SERVICES [address] [place], [place], [address] Phone: [phone]   Fax:  [phone]  Physical Therapy Treatment  Patient Details  Name: Krystal Kelley MRN: [id number] Date of Birth: 12/18/1970 Referring Provider: Lindi Adie  Encounter Date: 09/04/2015      PT End of Session - 09/04/15 1007    Visit Number 5   Number of Visits 12   Date for PT Re-Evaluation 11/05/15   PT Start Time 0950   PT Stop Time 1030   PT Time Calculation (min) 40 min   Activity Tolerance Patient tolerated treatment well   Behavior During Therapy [facility] for tasks assessed/performed      Past Medical History  Diagnosis Date  . Hypertension   . Breast cancer, right breast ([place])   . Radiation 10/30/14-12/17/14    right breast 50.4 gray    Past Surgical History  Procedure Laterality Date  . Bunion surgrery    . Carpel tunnel    . Breast biopsy  05/16/14  . Cholecystectomy  2015  . Portacath placement Left 06/18/2014    Procedure: INSERTION PORT-A-CATH;  Surgeon: Excell Seltzer, MD;  Location: [place];  Service: General;  Laterality: Left;    There were no vitals filed for this visit.      Subjective Assessment - 09/04/15 1001    Subjective Pt reported no fatigue after last session. Pt will be seeing her MD next Tues and hope to get back to working 8 hrs. Pt recognizes her body wants to adjust to having just undergone chemo.                       Adult Aquatic Therapy - 09/04/15 1004    Aquatic Therapy Subjective   Subjective Pt had no complaints.      O: O: Pt entered/exited the pool via steps with single UE support on rail. Exercises performed in 3'6" depth  50 ft =1 lap  2 laps forward walking 2 laps zig zag walking to improve hip abduction  Exercises performed in 4'6"  30 sec  X 3 standing marching with dumbbells  60 sec x 1, 45 sec x 3  treading water 2 laps sidestep + heel raises     Yoga poses: warrior II, chair + twist, SLS piriformis stretch  PNF UE  D1 flex 10 reps each side   PNF UE  D2 fext 10 reps each side   Relaxation on noodles 8'                   PT Education - 09/04/15 1004    Education provided Yes   Education Details energy conservation strategies, improve sleep quality education   Person(s) Educated Patient   Methods Explanation;Demonstration;Tactile cues;Verbal cues   Comprehension Returned demonstration;Verbalized understanding             PT Long Term Goals - 09/04/15 1007    PT LONG TERM GOAL #1   Title Pt will report being able to walk 5 blocks without pain in order to exercise.   Time 12   Period Weeks   Status Partially Met   PT LONG TERM GOAL #2   Title Pt will have increased functional ROM with RUE for wiping and cleaning back and bottom.    Time 12   Period Weeks   Status Partially Met   PT LONG TERM GOAL #3   Title Pt will be able to  lay on her R side without interruption of pain in order to increase sleep quality.    Time 12   Period Weeks   Status On-going   PT LONG TERM GOAL #4   Title Pt will decrease her Fatigue Severity Scale from 70% to < 50% in order to perform her ADLs, walk and exercise.   Time 12   Period Weeks   Status New   PT LONG TERM GOAL #5   Title Pt will increase her LEFS score from 73% to  80% in order to play sports, walk, and squat.    Status On-going                 Plan - 09/04/15 1007    Clinical Impression Statement Pt is progressing well with her goals. Pt reports she has 50% improvement with raising her arm to stretch and reaching around her back for toileting without mm spasms in her pect mm. Pt continues to tolerate high intensity interval training better than endurance exercises. Pt shows better understanding on energy conservation principles and is working on improving sleep hygiene. PT emphasized this lifestyle change in order help pt to increase hours of sleep  and be bettered prepared to return to working regular hours at her job without fatigue. Plan to address pt's sidelying goal on future land appts to improve sleep quality. Pt has been referred to see an occupational therapist who is also a lymphedema specialist for prevention of lymphemdema in her RUE. Pt will continue to benefit from skilled PT.    Rehab Potential Excellent   Clinical Impairments Affecting Rehab Potential none    PT Frequency 2x / week   PT Duration 12 weeks   PT Treatment/Interventions ADLs/Self Care Home Management;Aquatic Therapy;Cryotherapy;Balance training;Neuromuscular re-education;Gait training;Therapeutic exercise;Therapeutic activities;Functional mobility training;Stair training;Patient/family education;Manual techniques;Taping;Energy conservation;Scar mobilization   Consulted and Agree with Plan of Care Patient      Patient will benefit from skilled therapeutic intervention in order to improve the following deficits and impairments:  Decreased range of motion, Decreased strength, Impaired UE functional use, Hypomobility, Abnormal gait, Decreased activity tolerance, Improper body mechanics, Pain, Postural dysfunction, Increased muscle spasms, Decreased safety awareness, Decreased coordination, Decreased endurance, Decreased scar mobility  Visit Diagnosis: Pain in right hip  Pain in right shoulder  Other abnormalities of gait and mobility  Unspecified lack of coordination     Problem List Patient Active Problem List   Diagnosis Date Noted  . Obesity (BMI 35.0-39.9 without comorbidity) ([place]) 03/20/2015  . Leg edema, right 10/03/2014  . Genetic testing 07/10/2014  . Breast cancer of lower-inner quadrant of right female breast ([place]) 05/29/2014    [name] ,PT, DPT, E-RYT  09/04/2015, 10:11 AM  [place] MAIN [facility] SERVICES [address] [place], [place], [address] Phone: [phone]   Fax:   [phone]  Name: Krystal Kelley MRN: [id number] Date of Birth: 1970/12/21

## 2015-09-04 NOTE — Therapy (Signed)
[facility] [address] [place], [address] Phone: [phone]   Fax:  [phone]  Occupational Therapy Treatment  Patient Details  Name: Krystal Kelley MRN: [id number] Date of Birth: [birth year] Referring Provider: Lindi Adie  Encounter Date: 09/04/2015      OT End of Session - 09/04/15 1851    Visit Number 1   Number of Visits 3   Date for OT Re-Evaluation 10/02/15   OT Start Time 1645   OT Stop Time 1731   OT Time Calculation (min) 46 min   Behavior During Therapy [facility] for tasks assessed/performed      Past Medical History  Diagnosis Date  . Hypertension   . Breast cancer, right breast ([place])   . Radiation 10/30/14-12/17/14    right breast 50.4 gray    Past Surgical History  Procedure Laterality Date  . Bunion surgrery    . Carpel tunnel    . Breast biopsy  05/16/14  . Cholecystectomy  2015  . Portacath placement Left 06/18/2014    Procedure: INSERTION PORT-A-CATH;  Surgeon: Excell Seltzer, MD;  Location: [place];  Service: General;  Laterality: Left;    There were no vitals filed for this visit.      Subjective Assessment - 09/04/15 1834    Subjective  I had been doing PT in the pool and she referred me to her for assessment if lymphedema - I had past some pain in R shoulder, spasm over my chest and upper back , my breast to feel fuller since after radiation -  I do not know about my  arm    Patient Stated Goals I do not want lymphedema and wear garments for the rest of my life - want my R shoulder better   Currently in Pain? No/denies            [facility] OT Assessment - 09/04/15 0001    Assessment   Diagnosis R thorasic and UE lymphedema stage 1   Referring Provider Gudena   Onset Date 11/18/14   Precautions   Precaution Comments Lymphedema for R UE    Home  Environment   Lives With Family   Prior Function   Level of Independence Independent   Leisure Pt use to  work at [employer] - but on LT disability , R hand dominant , want to play soft ball again , do own lawn, yeard, house work , fishing , and has 46 yr old daughter          LYMPHEDEMA/ONCOLOGY QUESTIONNAIRE - 09/04/15 1840    Surgeries   Lumpectomy Date 06/18/15   Number Lymph Nodes Removed 5   Treatment   Past Chemotherapy Treatment Yes   Past Radiation Treatment Yes   Current Hormone Treatment Yes   What other symptoms do you have   Are you Having Heaviness or Tightness Yes   Are you having Pain Yes   Other Symptoms breast fuller on R    Lymphedema Stage   Stage STAGE 1 SPONTANEOUSLY REVERSIBLE   Lymphedema Assessments   Lymphedema Assessments --  Breast on R lat<> med - 33cm  , 31.5 on L    Right Upper Extremity Lymphedema   10 cm Proximal to Olecranon Process 41.5 cm   Olecranon Process 33.3 cm   10 cm Proximal to Ulnar Styloid Process 19 cm   Just Proximal to Ulnar Styloid Process 21.8 cm   At Base of 2nd Digit 6.8  cm   Left Upper Extremity Lymphedema   10 cm Proximal to Olecranon Process 40.5 cm   Olecranon Process 32 cm   10 cm Proximal to Ulnar Styloid Process 20 cm   Just Proximal to Ulnar Styloid Process 21.5 cm   At Base of 2nd Digit 7 cm              Adult Aquatic Therapy - 09/04/15 1004    Aquatic Therapy Subjective   Subjective Pt had no complaints.                    OT Education - 09/04/15 1851    Education provided Yes   Education Details Lymphedema    Person(s) Educated Patient   Methods Explanation;Demonstration;Tactile cues;Verbal cues;Handout   Comprehension Verbal cues required;Returned demonstration;Verbalized understanding          OT Short Term Goals - 09/04/15 1857    OT SHORT TERM GOAL #1   Title Pt R UE elbow to upper arm decrease by 1cm by wearing compression sleeves and MLD    Baseline increase to 1-2 cm elbow to upper arm - and R breast increase by 1.5 cm    Time 3   Status New           OT Long Term Goals -  09/04/15 1858    OT LONG TERM GOAL #1   Title Pt to be ind in wearing of compression garments and  doing self MLD    Baseline no knowledge   Time 4   Period Weeks   Status New           Long Term Clinic Goals - 07/24/15 1159    CC Long Term Goal  #1   Title Right shoulder active flexion to at least 165 degrees.   Status Achieved   CC Long Term Goal  #2   Title Right shoulder active abduction to at least 180 degrees.   Status Achieved   CC Long Term Goal  #3   Title Patient will be independent in HEP including strengthening for right shoulder, and in safe self-progression.   Status Achieved   CC Long Term Goal  #4   Title Pt. will report perception of at least 30% improvement in functional strength in right shoulder.   Status Achieved            Plan - 09/04/15 1852    Clinical Impression Statement Pt refer by PT that is treating her in pool and land for R shoulder and hip pain - pt  present with stage 1 possible stage 2 lypmphedema in R thorasic and UE - pt R UE increas from elbow to upper arm compare to L by 1-2 cm - and R breast circumference from lat to med by 1.5 cm - pt do report R breast felt heavier and fuller since Radiation  - pt was ed on precautions and send to DME for fitting with daytime compression sleeve and glove as well as Jovipak breast pad for R  - pt to follow up in 3 wks for reassement of measurements and ed on self MLD    Rehab Potential Good   OT Frequency Other (comment)   OT Duration 4 weeks   OT Treatment/Interventions Self-care/ADL training;Manual lymph drainage;Patient/family education;Therapeutic exercises   Plan Pt to get compression garments and assess in 2-3 wks measurements   OT Home Exercise Plan See pt instruction   Consulted and Agree with Plan of Care Patient  Patient will benefit from skilled therapeutic intervention in order to improve the following deficits and impairments:  Impaired UE functional use, Increased edema  Visit  Diagnosis: Lymphedema, not elsewhere classified - Plan: Ot plan of care cert/re-cert    Problem List Patient Active Problem List   Diagnosis Date Noted  . Obesity (BMI 35.0-39.9 without comorbidity) ([place]) 03/20/2015  . Leg edema, right 10/03/2014  . Genetic testing 07/10/2014  . Breast cancer of lower-inner quadrant of right female breast ([place]) 05/29/2014    Rosalyn Gess OTR/L,CLT  09/04/2015, 7:03 PM  [place] [facility] [address], [place], [address] Phone: [phone]   Fax:  [phone]  Name: Krystal Kelley MRN: [id number] Date of Birth: 1970-10-12

## 2015-09-08 ENCOUNTER — Telehealth: Payer: Self-pay | Admitting: Hematology and Oncology

## 2015-09-08 ENCOUNTER — Ambulatory Visit (HOSPITAL_BASED_OUTPATIENT_CLINIC_OR_DEPARTMENT_OTHER): Payer: BLUE CROSS/BLUE SHIELD | Admitting: Hematology and Oncology

## 2015-09-08 ENCOUNTER — Encounter: Payer: Self-pay | Admitting: Hematology and Oncology

## 2015-09-08 VITALS — BP 151/76 | HR 92 | Temp 99.0°F | Resp 19 | Wt 267.0 lb

## 2015-09-08 DIAGNOSIS — C50311 Malignant neoplasm of lower-inner quadrant of right female breast: Secondary | ICD-10-CM

## 2015-09-08 DIAGNOSIS — Z170 Estrogen receptor positive status [ER+]: Secondary | ICD-10-CM

## 2015-09-08 DIAGNOSIS — R5383 Other fatigue: Secondary | ICD-10-CM | POA: Diagnosis not present

## 2015-09-08 DIAGNOSIS — E669 Obesity, unspecified: Secondary | ICD-10-CM | POA: Diagnosis not present

## 2015-09-08 NOTE — Progress Notes (Signed)
Patient Care Team: Albina Billet, MD as PCP - General (Internal Medicine) Albina Billet, MD (Internal Medicine) Nicholas Lose, MD as Consulting Physician (Hematology and Oncology) Excell Seltzer, MD as Consulting Physician (General Surgery) Gery Pray, MD as Consulting Physician (Radiation Oncology) Sylvan Cheese, NP as Nurse Practitioner (Hematology and Oncology)  DIAGNOSIS: Breast cancer of lower-inner quadrant of right female breast [facility])   Staging form: Breast, AJCC 7th Edition     Clinical stage from 05/28/2014: Stage IA (T1c, N0, M0) - Unsigned     Pathologic stage from 06/18/2014: Stage IA (T1c, N0, cM0) - Unsigned   SUMMARY OF ONCOLOGIC HISTORY:   Breast cancer of lower-inner quadrant of right female breast ([place])   05/14/2014 Mammogram Right mammogram: 1.5 cm round mass in right breast at 3:00   05/14/2014 Breast US Right breast: irregular, hypoechoic mass with indistinct margins at 3:00 mearying 1.1 x 0.8 x 1.3 cm.   05/16/2014 Initial Biopsy Ultrasound-guided biopsy: Invasive ductal carcinoma with DCIS, grade 1-2, ER/PR positive, Ki-67 43%, HER-2 positive   05/28/2014 Breast MRI 1.4 x 1.2 x 1.0 cm biopsy-proven invasive ductal carcinoma and ductal carcinoma in situ in the lower inner quadrant of the right breast.    05/28/2014 Clinical Stage Stage IA: T1c N0   06/13/2014 Procedure Breast/Next: Variant of Unknown Significance in the ATM gene called p.4477C>T, p.[id number]. Otherwise, no clinically significant mutation at ATM, BARD1, BRCA1, BRCA2, BRIP1, CDH1, CHEK2, MRE11A, MUTYH, NBN, NF1, PALB2, PTEN, RAD50, RAD51C, RAD51D, and TP53   06/18/2014 Surgery Right breast lumpectomy: Invasive ductal carcinoma 1.3 cm negative for LVI; DCIS 1 mm from margins, 1 sentinel node negative, grade 3 ER 95%, PR 77%, HER-2 amplified ratio 8.25, Ki-67 43%   06/18/2014 Pathologic Stage Stage IA: T1c N0 M0   07/18/2014 - 10/03/2014 Chemotherapy Abraxane Herceptin weekly 12 followed by Herceptin  maintenance every 3 weeks to complete one year completed 07/03/2015   10/30/2014 - 12/17/2014 Radiation Therapy Adjuvant radiation therapy: Right breast 50.4 gray in 28 fractions, lumpectomy cavity boost 12 gray in 6 fractions   02/01/2015 -  Anti-estrogen oral therapy Tamoxifen 20 mg daily    CHIEF COMPLIANT: Follow-up on tamoxifen therapy, concern with weight gain  INTERVAL HISTORY: Krystal Kelley is a 45 year old with above-mentioned history of right breast cancer treated with right lumpectomy followed by adjuvant chemotherapy with Herceptin followed by adjuvant radiation and is currently on tamoxifen therapy. She appears to be tolerating tamoxifen fairly well. She attributes her current weight gain to tamoxifen treatment. She is determined to lose this weight. She joined a gym and is planning to work out regularly. She is currently on long-term disability. She still does not feel back to her normal self. She is currently undergoing physical therapy and lymphedema management on her right arm. She does not have the emotional ups and downs that she used to before.  REVIEW OF SYSTEMS:   Constitutional: Denies fevers, chills or abnormal weight loss Eyes: Denies blurriness of vision Ears, nose, mouth, throat, and face: Denies mucositis or sore throat Respiratory: Denies cough, dyspnea or wheezes Cardiovascular: Denies palpitation, chest discomfort Gastrointestinal:  Denies nausea, heartburn or change in bowel habits Skin: Denies abnormal skin rashes Lymphatics: Right arm lymphedema currently undergoing physical therapy Neurological:Denies numbness, tingling or new weaknesses Behavioral/Psych: Mood is stable, no new changes  Extremities: No lower extremity edema Breast:  denies any pain or lumps or nodules in either breasts All other systems were reviewed with the patient and are negative.  I have reviewed the past medical history, past surgical history, social history and family history with  the patient and they are unchanged from previous note.  ALLERGIES:  has No Known Allergies.  MEDICATIONS:  Current Outpatient Prescriptions  Medication Sig Dispense Refill  . chlorpheniramine-HYDROcodone (TUSSIONEX) 10-8 MG/5ML LQCR Take 5 mLs by mouth every 12 (twelve) hours as needed for cough.   0  . Eluxadoline (VIBERZI) 100 MG TABS Take 1 tablet by mouth 2 (two) times daily. 60 tablet 5  . fluticasone (FLONASE) 50 MCG/ACT nasal spray SPRAY 2 SPRAYS IN EACH NOSTRILS  2  . furosemide (LASIX) 40 MG tablet TAKE 1 TABLET BY MOUTH EVERY DAY AS NEEDED 90 tablet 0  . hydrochlorothiazide (HYDRODIURIL) 25 MG tablet Take 25 mg by mouth daily.    [name] lidocaine-prilocaine (EMLA) cream Apply 1 application topically as needed. 30 g 0  . RESTASIS 0.05 % ophthalmic emulsion INSTILL ONE DROP IN AFFECTED EYE 2 TIMES A DAY  4  . tamoxifen (NOLVADEX) 20 MG tablet Take 1 tablet (20 mg total) by mouth daily. 90 tablet 3  . valACYclovir (VALTREX) 500 MG tablet      No current facility-administered medications for this visit.    PHYSICAL EXAMINATION: ECOG PERFORMANCE STATUS: 1 - Symptomatic but completely ambulatory  Filed Vitals:   09/08/15 1039  BP: 151/76  Pulse: 92  Temp: 99 F (37.2 C)  Resp: 19   Filed Weights   09/08/15 1039  Weight: 267 lb (121.11 kg)    GENERAL:alert, no distress and comfortable SKIN: skin color, texture, turgor are normal, no rashes or significant lesions EYES: normal, Conjunctiva are pink and non-injected, sclera clear OROPHARYNX:no exudate, no erythema and lips, buccal mucosa, and tongue normal  NECK: supple, thyroid normal size, non-tender, without nodularity LYMPH:  no palpable lymphadenopathy in the cervical, axillary or inguinal LUNGS: clear to auscultation and percussion with normal breathing effort HEART: regular rate & rhythm and no murmurs and no lower extremity edema ABDOMEN:abdomen soft, non-tender and normal bowel sounds MUSCULOSKELETAL:no cyanosis of  digits and no clubbing  NEURO: alert & oriented x 3 with fluent speech, no focal motor/sensory deficits EXTREMITIES: No lower extremity edema  LABORATORY DATA:  I have reviewed the data as listed   Chemistry      Component Value Date/Time   NA 142 08/15/2015 1035   NA 140 08/01/2014 0918   NA 135* 06/11/2013 0408   K 3.7 08/15/2015 1035   K 4.0 08/01/2014 0918   K 3.2* 06/11/2013 0408   CL 107 08/01/2014 0918   CL 102 06/11/2013 0408   CO2 28 08/15/2015 1035   CO2 28 08/01/2014 0918   CO2 26 06/11/2013 0408   BUN 12.9 08/15/2015 1035   BUN 12 08/01/2014 0918   BUN 15 06/11/2013 0408   CREATININE 0.9 08/15/2015 1035   CREATININE 0.93 08/01/2014 0918   CREATININE 1.16 06/11/2013 0408      Component Value Date/Time   CALCIUM 9.0 08/15/2015 1035   CALCIUM 8.8 08/01/2014 0918   CALCIUM 8.7 06/11/2013 0408   ALKPHOS 60 07/03/2015 0954   ALKPHOS 54 08/01/2014 0918   ALKPHOS 75 06/12/2013 1541   AST 26 07/03/2015 0954   AST 41* 08/01/2014 0918   AST 137* 06/12/2013 1541   ALT 28 07/03/2015 0954   ALT 54* 08/01/2014 0918   ALT 192* 06/12/2013 1541   BILITOT 0.33 07/03/2015 0954   BILITOT 0.2* 08/01/2014 0918   BILITOT 0.5 06/12/2013 1541  Lab Results  Component Value Date   WBC 13.5* 07/03/2015   HGB 13.1 07/03/2015   HCT 39.5 07/03/2015   MCV 91.5 07/03/2015   PLT 320 07/03/2015   NEUTROABS 11.3* 07/03/2015     ASSESSMENT & PLAN:  Breast cancer of lower-inner quadrant of right female breast Right breast lumpectomy 06/18/2014: Invasive ductal carcinoma 1.3 cm negative for LVI; DCIS 1 mm from margins, 1 sentinel node negative, grade 3 ER 95%, PR 77%, HER-2 amplified ratio 8.25, Ki-67 43% T1 cN0 M0 stage IA  Treatment summary S/P Adjuvant chemo with Abraxane Herceptin started 07/18/2014 completed 10/03/14, Herceptin maintenance completed 07/03/2015 Status post adjuvant radiation completed 12/17/2014 Started tamoxifen 12/26/2014  Current treatment: Tamoxifen  20 mg daily 12/26/2014  Tamoxifen toxicities:  1. Emotional changes especially crying spells and anxiety spells: Much improved 2. Vaginal yeast infections: Treated with over-the-counter creams with improvement. Denies any hot flashes.  Fatigue: I discussed with her about increasing her physical activity and losing weight.  Obesity: Patient is exercising more often. I instructed her to join the living well class to help her lose weight.  Return to clinic in 6 months   No orders of the defined types were placed in this encounter.   The patient has a good understanding of the overall plan. she agrees with it. she will call with any problems that may develop before the next visit here.   Rulon Eisenmenger, MD 09/08/2015

## 2015-09-08 NOTE — Telephone Encounter (Signed)
appt made and avs printed °

## 2015-09-08 NOTE — Assessment & Plan Note (Signed)
Right breast lumpectomy 06/18/2014: Invasive ductal carcinoma 1.3 cm negative for LVI; DCIS 1 mm from margins, 1 sentinel node negative, grade 3 ER 95%, PR 77%, HER-2 amplified ratio 8.25, Ki-67 43% T1 cN0 M0 stage IA  Treatment summary S/P Adjuvant chemo with Abraxane Herceptin started 07/18/2014 completed 10/03/14, Herceptin maintenance completed 07/03/2015 Status post adjuvant radiation completed 12/17/2014 Started tamoxifen 12/26/2014  Current treatment: Tamoxifen 20 mg daily 12/26/2014  Tamoxifen toxicities:  1. Emotional changes especially crying spells and anxiety spells: Much improved 2. Vaginal yeast infections: Treated with over-the-counter creams with improvement. Denies any hot flashes.  Fatigue: I discussed with her about increasing her physical activity and losing weight.  Obesity: Patient is exercising more often. I instructed her to join the living well class to help her lose weight.  Return to clinic in 6 months

## 2015-09-10 ENCOUNTER — Encounter (INDEPENDENT_AMBULATORY_CARE_PROVIDER_SITE_OTHER): Payer: BLUE CROSS/BLUE SHIELD | Admitting: Podiatry

## 2015-09-10 NOTE — Progress Notes (Signed)
This encounter was created in error - please disregard.

## 2015-09-11 ENCOUNTER — Ambulatory Visit: Payer: BLUE CROSS/BLUE SHIELD | Attending: Hematology and Oncology | Admitting: Physical Therapy

## 2015-09-11 ENCOUNTER — Ambulatory Visit: Payer: BLUE CROSS/BLUE SHIELD | Admitting: Physical Therapy

## 2015-09-11 DIAGNOSIS — M25511 Pain in right shoulder: Secondary | ICD-10-CM

## 2015-09-11 DIAGNOSIS — R279 Unspecified lack of coordination: Secondary | ICD-10-CM | POA: Insufficient documentation

## 2015-09-11 DIAGNOSIS — M25551 Pain in right hip: Secondary | ICD-10-CM | POA: Insufficient documentation

## 2015-09-11 DIAGNOSIS — R2689 Other abnormalities of gait and mobility: Secondary | ICD-10-CM | POA: Insufficient documentation

## 2015-09-11 NOTE — Therapy (Signed)
[place] [facility] [address] [place], [place], [address] Phone: [phone]   Fax:  [phone]  Physical Therapy Treatment  Patient Details  Name: Krystal Kelley MRN: [id number] Date of Birth: [birth year] Referring Provider: Lindi Adie  Encounter Date: 09/11/2015      PT End of Session - 09/11/15 1450    Visit Number 6   Number of Visits 12   Date for PT Re-Evaluation 11/05/15   PT Start Time 2458   PT Stop Time 1230   PT Time Calculation (min) 45 min      Past Medical History  Diagnosis Date  . Hypertension   . Breast cancer, right breast ([place])   . Radiation 10/30/14-12/17/14    right breast 50.4 gray    Past Surgical History  Procedure Laterality Date  . Bunion surgrery    . Carpel tunnel    . Breast biopsy  05/16/14  . Cholecystectomy  2015  . Portacath placement Left 06/18/2014    Procedure: INSERTION PORT-A-CATH;  Surgeon: Excell Seltzer, MD;  Location: [place];  Service: General;  Laterality: Left;    There were no vitals filed for this visit.      Subjective Assessment - 09/11/15 1448    Subjective Pt reported she woke up with a "crook in her neck" this morning and not able to move it.  Pt states she finds the aquatic therapy to be helpful just like how her healthcare providers had informed her.                      Adult Aquatic Therapy - 09/11/15 1450    Aquatic Therapy Subjective   Subjective Pt had no complaints.         O: Pt entered/exited the pool via steps with single UE support on rail. Exercises performed in 3'6" depth  50 ft =1 lap  Supported on noodles and PT, gentle ROM with neck 5'  Ball toss w/ sidestepping 5'  Step up/down with pulling on noodle against PT's resistance 30 sec x 3 with 30 sec rests  Straddle jumps with pulling on noodle against PT's resistance 30 sec x 3 w/ 30 sec rests  Treading water 30 sec x 2 Tug-a-war with PT on noodle to walk  on semi tandem stance (simulated pulling doors)  Weight shift to posterior foot 5' Sidestretching, sidestretching with trunk rotation, arms overhead x 3 reps each step  Relaxation 5' against wall , neck relaxed on noodles and wall                    PT Long Term Goals - 09/04/15 1007    PT LONG TERM GOAL #1   Title Pt will report being able to walk 5 blocks without pain in order to exercise.   Time 12   Period Weeks   Status Partially Met   PT LONG TERM GOAL #2   Title Pt will have increased functional ROM with RUE for wiping and cleaning back and bottom.    Time 12   Period Weeks   Status Partially Met   PT LONG TERM GOAL #3   Title Pt will be able to lay on her R side without interruption of pain in order to increase sleep quality.    Time 12   Period Weeks   Status On-going   PT LONG TERM GOAL #4   Title Pt will decrease her Fatigue Severity Scale  from 70% to < 50% in order to perform her ADLs, walk and exercise.   Time 12   Period Weeks   Status New   PT LONG TERM GOAL #5   Title Pt will increase her LEFS score from 73% to  80% in order to play sports, walk, and squat.    Status On-going            Plan - 09/11/15 1451    Clinical Impression Statement Pt showed slightly increased range of motion in her neck with exercises and tolerated progressions of high intensity, short duration aerobic exercises without difficulty. Pt continues to report that aquatic Tx is helping her to feel better. Pt will work with lymphedema specialist on getting a sleeve to minimize RUE swelling.  Pt continues to benefit from skilled PT.   Rehab Potential Excellent   Clinical Impairments Affecting Rehab Potential none    PT Frequency 2x / week   PT Duration 12 weeks   PT Treatment/Interventions ADLs/Self Care Home Management;Aquatic Therapy;Cryotherapy;Balance training;Neuromuscular re-education;Gait training;Therapeutic exercise;Therapeutic activities;Functional mobility  training;Stair training;Patient/family education;Manual techniques;Taping;Energy conservation;Scar mobilization   Consulted and Agree with Plan of Care Patient      Patient will benefit from skilled therapeutic intervention in order to improve the following deficits and impairments:  Decreased range of motion, Decreased strength, Impaired UE functional use, Hypomobility, Abnormal gait, Decreased activity tolerance, Improper body mechanics, Pain, Postural dysfunction, Increased muscle spasms, Decreased safety awareness, Decreased coordination, Decreased endurance, Decreased scar mobility  Visit Diagnosis: Pain in right hip  Pain in right shoulder  Other abnormalities of gait and mobility  Unspecified lack of coordination     Problem List Patient Active Problem List   Diagnosis Date Noted  . Obesity (BMI 35.0-39.9 without comorbidity) ([place]) 03/20/2015  . Leg edema, right 10/03/2014  . Genetic testing 07/10/2014  . Breast cancer of lower-inner quadrant of right female breast ([place]) 05/29/2014    [name] ,PT, DPT, E-RYT  09/11/2015, 2:52 PM  [place] [facility] SERVICES [address] [place], [place], [address] Phone: [phone]   Fax:  [phone]  Name: Krystal Kelley MRN: [id number] Date of Birth: March 22, 1971

## 2015-09-12 ENCOUNTER — Other Ambulatory Visit: Payer: Self-pay | Admitting: Hematology and Oncology

## 2015-09-16 ENCOUNTER — Other Ambulatory Visit: Payer: Self-pay

## 2015-09-16 ENCOUNTER — Ambulatory Visit: Payer: BLUE CROSS/BLUE SHIELD | Admitting: Physical Therapy

## 2015-09-16 DIAGNOSIS — R2689 Other abnormalities of gait and mobility: Secondary | ICD-10-CM

## 2015-09-16 DIAGNOSIS — M25551 Pain in right hip: Secondary | ICD-10-CM | POA: Diagnosis not present

## 2015-09-16 DIAGNOSIS — R279 Unspecified lack of coordination: Secondary | ICD-10-CM

## 2015-09-16 DIAGNOSIS — M25511 Pain in right shoulder: Secondary | ICD-10-CM

## 2015-09-16 MED ORDER — FUROSEMIDE 40 MG PO TABS: 40.0000 mg | ORAL_TABLET | Freq: Every day | ORAL | Status: DC | PRN

## 2015-09-17 NOTE — Therapy (Signed)
[place] MAIN [facility] SERVICES [address] [place], [place], [address] Phone: [phone]   Fax:  [phone]  Physical Therapy Treatment  Patient Details  Name: Krystal Kelley MRN: [id number] Date of Birth: Mar 21, 1971 Referring Provider: Lindi Adie  Encounter Date: 09/16/2015      PT End of Session - 09/17/15 1907    Visit Number 7   Number of Visits 12   Date for PT Re-Evaluation 11/05/15   PT Start Time 1115   PT Stop Time 1200   PT Time Calculation (min) 45 min      Past Medical History  Diagnosis Date  . Hypertension   . Breast cancer, right breast ([place])   . Radiation 10/30/14-12/17/14    right breast 50.4 gray    Past Surgical History  Procedure Laterality Date  . Bunion surgrery    . Carpel tunnel    . Breast biopsy  05/16/14  . Cholecystectomy  2015  . Portacath placement Left 06/18/2014    Procedure: INSERTION PORT-A-CATH;  Surgeon: Excell Seltzer, MD;  Location: [place];  Service: General;  Laterality: Left;    There were no vitals filed for this visit.      Subjective Assessment - 09/17/15 1908    Subjective Pt reported she had a busy weekend and has not gone to softball practice. Pt plans to get her arm fitted for a sleeve before seeing her OT next week. Pt has not noticed any discomfort with sleeping on her side.                      Adult Aquatic Therapy - 09/17/15 1909    Aquatic Therapy Subjective   Subjective Pt had no complaints.         O: Pt entered/exited the pool via steps with single UE support on rail.  50 ft =1 lap   Exercises performed in 3'6" depth   forward walking 4 laps sidestepping, 4 laps step up/ down 10 reps on step with dumbbells in arms  1' hip abd to hops onto step with dumbbells in BUE  1' mini squats with shoulder ER with dumbbells in BUE 10 reps x 3  flutter kick in prone with kick board on BUE with PT support abdomen 2 laps (cued for not  bending knees)  Pt performed flutter kick with better form with BUE on rail  30 "    Exercises performed in 4'6" depth   forward walking with kickboard at waist (cued for shoulder depression) 2 laps sidestepping shoulder UE/ LE abd/ add with dumbbells under water 2 laps   Relaxation 5" on noodles               PT Long Term Goals - 09/04/15 1007    PT LONG TERM GOAL #1   Title Pt will report being able to walk 5 blocks without pain in order to exercise.   Time 12   Period Weeks   Status Partially Met   PT LONG TERM GOAL #2   Title Pt will have increased functional ROM with RUE for wiping and cleaning back and bottom.    Time 12   Period Weeks   Status Partially Met   PT LONG TERM GOAL #3   Title Pt will be able to lay on her R side without interruption of pain in order to increase sleep quality.    Time 12   Period Weeks   Status On-going  PT LONG TERM GOAL #4   Title Pt will decrease her Fatigue Severity Scale from 70% to < 50% in order to perform her ADLs, walk and exercise.   Time 12   Period Weeks   Status New   PT LONG TERM GOAL #5   Title Pt will increase her LEFS score from 73% to  80% in order to play sports, walk, and squat.    Status On-going           Long Term Clinic Goals - 07/24/15 1159    CC Long Term Goal  #1   Title Right shoulder active flexion to at least 165 degrees.   Status Achieved   CC Long Term Goal  #2   Title Right shoulder active abduction to at least 180 degrees.   Status Achieved   CC Long Term Goal  #3   Title Patient will be independent in HEP including strengthening for right shoulder, and in safe self-progression.   Status Achieved   CC Long Term Goal  #4   Title Pt. will report perception of at least 30% improvement in functional strength in right shoulder.   Status Achieved            Plan - 09/17/15 1907    Clinical Impression Statement Pt showed longer endurance with high intensity aerobic exercise. Pt was  able to perform 1 min intervals w/o fatigue.  Pt progressed to dynamic balance exercise and LE flutter kicks with BUE on kickboard. Pt continues to benefit from skilled PT.   Rehab Potential Excellent   Clinical Impairments Affecting Rehab Potential none    PT Frequency 2x / week   PT Duration 12 weeks   PT Treatment/Interventions ADLs/Self Care Home Management;Aquatic Therapy;Cryotherapy;Balance training;Neuromuscular re-education;Gait training;Therapeutic exercise;Therapeutic activities;Functional mobility training;Stair training;Patient/family education;Manual techniques;Taping;Energy conservation;Scar mobilization   Consulted and Agree with Plan of Care Patient      Patient will benefit from skilled therapeutic intervention in order to improve the following deficits and impairments:  Decreased range of motion, Decreased strength, Impaired UE functional use, Hypomobility, Abnormal gait, Decreased activity tolerance, Improper body mechanics, Pain, Postural dysfunction, Increased muscle spasms, Decreased safety awareness, Decreased coordination, Decreased endurance, Decreased scar mobility  Visit Diagnosis: Pain in right hip  Pain in right shoulder  Other abnormalities of gait and mobility  Unspecified lack of coordination     Problem List Patient Active Problem List   Diagnosis Date Noted  . Obesity (BMI 35.0-39.9 without comorbidity) ([place]) 03/20/2015  . Leg edema, right 10/03/2014  . Genetic testing 07/10/2014  . Breast cancer of lower-inner quadrant of right female breast ([place]) 05/29/2014    Krystal Kelley ,PT, DPT, E-RYT  09/17/2015, 7:10 PM  [place] MAIN [facility] SERVICES [address] [place], [place], [address] Phone: [phone]   Fax:  [phone]  Name: Krystal Kelley MRN: [id number] Date of Birth: Dec 09, 1970

## 2015-09-19 ENCOUNTER — Ambulatory Visit: Payer: BLUE CROSS/BLUE SHIELD | Attending: Hematology and Oncology | Admitting: Physical Therapy

## 2015-09-19 DIAGNOSIS — R279 Unspecified lack of coordination: Secondary | ICD-10-CM | POA: Insufficient documentation

## 2015-09-19 DIAGNOSIS — R2689 Other abnormalities of gait and mobility: Secondary | ICD-10-CM | POA: Diagnosis present

## 2015-09-19 DIAGNOSIS — I890 Lymphedema, not elsewhere classified: Secondary | ICD-10-CM | POA: Insufficient documentation

## 2015-09-19 DIAGNOSIS — M25551 Pain in right hip: Secondary | ICD-10-CM | POA: Insufficient documentation

## 2015-09-19 DIAGNOSIS — M25511 Pain in right shoulder: Secondary | ICD-10-CM | POA: Insufficient documentation

## 2015-09-19 NOTE — Therapy (Addendum)
[place] MAIN [facility] SERVICES [address] [place], [place], [address] Phone: [phone]   Fax:  [phone]  Physical Therapy Treatment/ Progress Note  Patient Details  Name: Krystal Kelley MRN: [id number] Date of Birth: 05-11-1970 Referring Provider: Lindi Adie  Encounter Date: 09/19/2015      PT End of Session - 09/19/15 1010    Visit Number 8   Number of Visits 12   Date for PT Re-Evaluation 11/05/15   PT Start Time 0915   PT Stop Time 0945   PT Time Calculation (min) 30 min   Activity Tolerance Patient tolerated treatment well;No increased pain   Behavior During Therapy [facility] for tasks assessed/performed      Past Medical History  Diagnosis Date  . Hypertension   . Breast cancer, right breast ([place])   . Radiation 10/30/14-12/17/14    right breast 50.4 gray    Past Surgical History  Procedure Laterality Date  . Bunion surgrery    . Carpel tunnel    . Breast biopsy  05/16/14  . Cholecystectomy  2015  . Portacath placement Left 06/18/2014    Procedure: INSERTION PORT-A-CATH;  Surgeon: Excell Seltzer, MD;  Location: [place];  Service: General;  Laterality: Left;    There were no vitals filed for this visit.      Subjective Assessment - 09/19/15 1512    Subjective Pt reported she felt tired today because she has had a busy week with PT and additional fitness training lessons each day. Pt felt L leg discomfort after walking on the treadmill.             [facility] PT Assessment - 09/19/15 1005    Observation/Other Assessments   Other Surveys  --  FSS 87%, LEFS 93%     Coordination   Gross Motor Movements are Fluid and Coordinated --  limited diaphragmatic excursion   Fine Motor Movements are Fluid and Coordinated --  able to demo coordination w/ pelvic floor ROM   Posture/Postural Control   Posture Comments lumbopelvic instability w/ hip flexion   Ambulation/Gait   Pre-Gait Activities no antalgic gait,  narrow BOS                     [facility] Adult PT Treatment/Exercise - 09/19/15 1005    Therapeutic Activites    Therapeutic Activities --  lifting weights in mini squat (10lb) x 3  with exhalation    ADL's guided pt to [place] and showed her the resources for massage and free classes for CA survivorship   Neuro Re-ed    Neuro Re-ed Details  see pt instructions                PT Education - 09/19/15 1009    Education provided Yes   Education Details Reassessed goals, deep core coordination and to apply with weight lifting, wider feet during gait to minimize LLE issue when on the treadmill    Person(s) Educated Patient   Methods Explanation;Demonstration;Tactile cues;Verbal cues;Handout   Comprehension Returned demonstration;Verbalized understanding             PT Long Term Goals - 09/19/15 0921    PT LONG TERM GOAL #1   Title Pt will report being able to walk 5 blocks without pain in order to exercise.   Time 12   Period Weeks   Status Partially Met   PT LONG TERM GOAL #2   Title Pt will have increased  functional ROM with RUE for wiping and cleaning back and bottom.    Time 12   Period Weeks   Status Achieved   PT LONG TERM GOAL #3   Title Pt will be able to lay on her R side without interruption of pain in order to increase sleep quality.    Time 12   Period Weeks   Status Achieved   PT LONG TERM GOAL #4   Title Pt will decrease her Fatigue Severity Scale from 70% to < 50% in order to perform her ADLs, walk and exercise. (6/2: 87%)    Time 12   Period Weeks   Status On-going   PT LONG TERM GOAL #5   Title Pt will increase her LEFS score from 73% to  80% in order to play sports, walk, and squat. (6/2: 93%)    Status Achieved   Additional Long Term Goals   Additional Long Term Goals Yes   PT LONG TERM GOAL #6   Title Pt will demo no lumbopelvic instability with 5 reps of Level 2 and 3 exercsies of the dynamic stabilization exercises in order  to minimize injuries risks with fitness training.   Time 12   Period Weeks   Status New           Long Term Clinic Goals - 07/24/15 1159    CC Long Term Goal  #1   Title Right shoulder active flexion to at least 165 degrees.   Status Achieved   CC Long Term Goal  #2   Title Right shoulder active abduction to at least 180 degrees.   Status Achieved   CC Long Term Goal  #3   Title Patient will be independent in HEP including strengthening for right shoulder, and in safe self-progression.   Status Achieved   CC Long Term Goal  #4   Title Pt. will report perception of at least 30% improvement in functional strength in right shoulder.   Status Achieved            Plan - 09/19/15 1010    Clinical Impression Statement Pt has achieved 3/6 goals. Pt regained ROM to clean and wash herself and her LEFS score improved significantly from 73% to 93%. 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 While pt shows increased endurance with exercises during PT, pt contiues to require education on the importance of pacing her activities and providing adequate rest to manage fatigue. Pt will benefit from  the continuation of skilled PT to address her remaining goals.     Rehab Potential Excellent   Clinical Impairments Affecting Rehab Potential none    PT Frequency 2x / week   PT Duration 12 weeks   PT Treatment/Interventions ADLs/Self Care Home Management;Aquatic Therapy;Cryotherapy;Balance training;Neuromuscular re-education;Gait training;Therapeutic exercise;Therapeutic activities;Functional mobility training;Stair training;Patient/family education;Manual techniques;Taping;Energy conservation;Scar mobilization   Consulted and Agree with Plan of Care Patient      Patient will benefit from skilled therapeutic intervention in order to improve the  following deficits and impairments:  Decreased range of motion, Decreased strength, Impaired UE functional use, Hypomobility, Abnormal gait, Decreased activity tolerance, Improper body mechanics, Pain, Postural dysfunction, Increased muscle spasms, Decreased safety awareness, Decreased coordination, Decreased endurance, Decreased scar mobility  Visit Diagnosis: Pain in right hip  Pain in right shoulder  Other abnormalities of gait and mobility  Unspecified lack of coordination     Problem List Patient Active Problem List   Diagnosis Date Noted  . Obesity (BMI 35.0-39.9 without comorbidity) ([place]) 03/20/2015  . Leg edema, right 10/03/2014  . Genetic testing 07/10/2014  . Breast cancer of lower-inner quadrant of right female breast ([place]) 05/29/2014    [name] ,PT, DPT, E-RYT  09/19/2015, 3:19 PM  [place] MAIN [facility] SERVICES [address] [place], [place], [address] Phone: [phone]   Fax:  [phone]  Name: Krystal Kelley MRN: [id number] Date of Birth: [birth year]

## 2015-09-19 NOTE — Patient Instructions (Signed)
You are now ready to begin training the deep core muscles system: diaphragm, transverse abdominis, pelvic floor . These muscles must work together as a team.           The key to these exercises to train the brain to coordinate the timing of these muscles and to have them turn on for long periods of time to hold you upright against gravity (especially important if you are on your feet all day).These muscles are postural muscles and play a role stabilizing your spine and bodyweight. By doing these repetitions slowly and correctly instead of doing crunches, you will achieve a flatter belly without a lower pooch. You are also placing your spine in a more neutral position and breathing properly which in turn, decreases your risk for problems related to your pelvic floor, abdominal, and low back such as pelvic organ prolapse, hernias, diastasis recti (separation of superficial muscles), disk herniations, spinal fractures. These exercises set a solid foundation for you to later progress to resistance/ strength training with therabands and weights and return to other typical fitness exercises with a stronger deeper core.  Level 1-2      Cancer Center schedule of free classes for Cancer Survivors and the number to call for free massages (handout)

## 2015-09-23 ENCOUNTER — Ambulatory Visit: Payer: BLUE CROSS/BLUE SHIELD | Admitting: Physical Therapy

## 2015-09-24 ENCOUNTER — Ambulatory Visit: Payer: BLUE CROSS/BLUE SHIELD | Admitting: Physical Therapy

## 2015-09-24 ENCOUNTER — Encounter: Payer: BLUE CROSS/BLUE SHIELD | Admitting: Physical Therapy

## 2015-09-24 ENCOUNTER — Ambulatory Visit: Payer: BLUE CROSS/BLUE SHIELD | Admitting: Occupational Therapy

## 2015-09-29 ENCOUNTER — Ambulatory Visit: Payer: BLUE CROSS/BLUE SHIELD | Admitting: Podiatry

## 2015-09-30 ENCOUNTER — Ambulatory Visit: Payer: BLUE CROSS/BLUE SHIELD | Admitting: Physical Therapy

## 2015-09-30 DIAGNOSIS — M25511 Pain in right shoulder: Secondary | ICD-10-CM

## 2015-09-30 DIAGNOSIS — M25551 Pain in right hip: Secondary | ICD-10-CM

## 2015-09-30 DIAGNOSIS — R279 Unspecified lack of coordination: Secondary | ICD-10-CM

## 2015-09-30 DIAGNOSIS — R2689 Other abnormalities of gait and mobility: Secondary | ICD-10-CM

## 2015-09-30 NOTE — Therapy (Signed)
[place] MAIN [facility] SERVICES [address] [place], [place], [address] Phone: [phone]   Fax:  [phone]  Physical Therapy Treatment  Patient Details  Name: Krystal Kelley MRN: [id number] Date of Birth: December 07, 1970 Referring Provider: Lindi Adie  Encounter Date: 09/30/2015      PT End of Session - 09/30/15 0840    Visit Number 9   Number of Visits 12   Date for PT Re-Evaluation 11/05/15   PT Start Time 0815   PT Stop Time 0845   PT Time Calculation (min) 30 min   Activity Tolerance Patient tolerated treatment well;No increased pain   Behavior During Therapy [facility] for tasks assessed/performed      Past Medical History  Diagnosis Date  . Hypertension   . Breast cancer, right breast ([place])   . Radiation 10/30/14-12/17/14    right breast 50.4 gray    Past Surgical History  Procedure Laterality Date  . Bunion surgrery    . Carpel tunnel    . Breast biopsy  05/16/14  . Cholecystectomy  2015  . Portacath placement Left 06/18/2014    Procedure: INSERTION PORT-A-CATH;  Surgeon: Excell Seltzer, MD;  Location: [place];  Service: General;  Laterality: Left;    There were no vitals filed for this visit.      Subjective Assessment - 09/30/15 0839    Subjective Pt reported she has been trying to swim at the pool outside of therapy.                      Adult Aquatic Therapy - 09/30/15 0840    Aquatic Therapy Subjective   Subjective Pt had no complaints.        O: Pt entered/exited the pool via steps with single UE support on rail. Exercises performed in 4'6" depth  50 ft =1 lap  2 laps of walking 10 laps of back ward stroke LE/UE 1 lap of back stroke LE with noodles under arms, back (cued for less knee flexion)   5' relaxation                    PT Long Term Goals - 09/19/15 8251    PT LONG TERM GOAL #1   Title Pt will report being able to walk 5 blocks without pain in order to  exercise.   Time 12   Period Weeks   Status Partially Met   PT LONG TERM GOAL #2   Title Pt will have increased functional ROM with RUE for wiping and cleaning back and bottom.    Time 12   Period Weeks   Status Achieved   PT LONG TERM GOAL #3   Title Pt will be able to lay on her R side without interruption of pain in order to increase sleep quality.    Time 12   Period Weeks   Status Achieved   PT LONG TERM GOAL #4   Title Pt will decrease her Fatigue Severity Scale from 70% to < 50% in order to perform her ADLs, walk and exercise. (6/2: 87%)    Time 12   Period Weeks   Status On-going   PT LONG TERM GOAL #5   Title Pt will increase her LEFS score from 73% to  80% in order to play sports, walk, and squat. (6/2: 93%)    Status Achieved   Additional Long Term Goals   Additional Long Term Goals Yes   PT  LONG TERM GOAL #6   Title Pt will demo no lumbopelvic instability with 5 reps of Level 2 and 3 exercsies of the dynamic stabilization exercises in order to minimize injuries risks with fitness training.   Time 12   Period Weeks   Status New               Plan - 09/30/15 0841    Clinical Impression Statement Pt showed excellent improvement with back stroke technique by the end of the session today. Educated pt on UE and LE form for optimal efficiency. Advised pt that backstroke will be more helpful than freestyle stroke for achieving her functional goals. Pt voiced understanding. Pt will continue to benefit from skilled PT.    Rehab Potential Excellent   PT Frequency 2x / week   PT Duration 12 weeks   PT Treatment/Interventions ADLs/Self Care Home Management;Aquatic Therapy;Cryotherapy;Balance training;Neuromuscular re-education;Gait training;Therapeutic exercise;Therapeutic activities;Functional mobility training;Stair training;Patient/family education;Manual techniques;Taping;Energy conservation;Scar mobilization   Consulted and Agree with Plan of Care Patient       Patient will benefit from skilled therapeutic intervention in order to improve the following deficits and impairments:  Decreased range of motion, Decreased strength, Impaired UE functional use, Hypomobility, Abnormal gait, Decreased activity tolerance, Improper body mechanics, Pain, Postural dysfunction, Increased muscle spasms, Decreased safety awareness, Decreased coordination, Decreased endurance, Decreased scar mobility  Visit Diagnosis: Pain in right hip  Pain in right shoulder  Other abnormalities of gait and mobility  Unspecified lack of coordination     Problem List Patient Active Problem List   Diagnosis Date Noted  . Obesity (BMI 35.0-39.9 without comorbidity) ([place]) 03/20/2015  . Leg edema, right 10/03/2014  . Genetic testing 07/10/2014  . Breast cancer of lower-inner quadrant of right female breast ([place]) 05/29/2014    [name] ,PT, DPT, E-RYT  09/30/2015, 8:45 AM  [place] MAIN [facility] SERVICES [address] [place], [place], [address] Phone: [phone]   Fax:  [phone]  Name: Krystal Kelley MRN: [id number] Date of Birth: [birth year]

## 2015-10-02 ENCOUNTER — Ambulatory Visit: Payer: BLUE CROSS/BLUE SHIELD | Attending: Hematology and Oncology | Admitting: Physical Therapy

## 2015-10-02 DIAGNOSIS — M25551 Pain in right hip: Secondary | ICD-10-CM | POA: Insufficient documentation

## 2015-10-02 DIAGNOSIS — M25511 Pain in right shoulder: Secondary | ICD-10-CM | POA: Insufficient documentation

## 2015-10-02 DIAGNOSIS — R2689 Other abnormalities of gait and mobility: Secondary | ICD-10-CM | POA: Insufficient documentation

## 2015-10-02 DIAGNOSIS — R279 Unspecified lack of coordination: Secondary | ICD-10-CM | POA: Insufficient documentation

## 2015-10-02 NOTE — Therapy (Signed)
[place] MAIN [facility] SERVICES [address] [place], [place], [address] Phone: [phone]   Fax:  [phone]  Physical Therapy Treatment  Patient Details  Name: OPLE GIRGIS MRN: [id number] Date of Birth: 1970/05/16 Referring Provider: Lindi Adie  Encounter Date: 10/02/2015      PT End of Session - 10/02/15 1148    Visit Number 10   Number of Visits 12   Date for PT Re-Evaluation 11/05/15   PT Start Time 1100   PT Stop Time 1145   PT Time Calculation (min) 45 min   Activity Tolerance Patient tolerated treatment well;No increased pain   Behavior During Therapy [facility] for tasks assessed/performed      Past Medical History  Diagnosis Date  . Hypertension   . Breast cancer, right breast ([place])   . Radiation 10/30/14-12/17/14    right breast 50.4 gray    Past Surgical History  Procedure Laterality Date  . Bunion surgrery    . Carpel tunnel    . Breast biopsy  05/16/14  . Cholecystectomy  2015  . Portacath placement Left 06/18/2014    Procedure: INSERTION PORT-A-CATH;  Surgeon: Excell Seltzer, MD;  Location: [place];  Service: General;  Laterality: Left;    There were no vitals filed for this visit.      Subjective Assessment - 10/02/15 1146    Subjective Pt reported she rested yesterday and has been working out at [employer].    Pertinent History Diagnosed with right breast cancer in February 2016; had lumpectomy June 18, 2014 with 4-5 lymph nodes removed (all negative).  Adjuvant chemotherapy (still on herceptin) and radiation, completed August 2016.  Had superficial blood clot in right leg that caused swelling, so was put on Lasix. ON tamoxifen.    Patient Stated Goals move better, return soft ball playing, walk and exercise longer                      Adult Aquatic Therapy - 10/02/15 1148    Aquatic Therapy Subjective   Subjective Pt had no complaints.         O: Pt entered/exited the pool via  steps with single UE support on rail. Exercises performed in 3'6" and -4'6" depth  50 ft =1 lap  4 laps walking in 4'6" depth Breathing under practice 20' 4 laps walking with breast stroke arm technique  4 laps sidestep with breast stroke arm   Underwater breathing with breast stroke 4 reps                         PT Long Term Goals - 09/19/15 0921    PT LONG TERM GOAL #1   Title Pt will report being able to walk 5 blocks without pain in order to exercise.   Time 12   Period Weeks   Status Partially Met   PT LONG TERM GOAL #2   Title Pt will have increased functional ROM with RUE for wiping and cleaning back and bottom.    Time 12   Period Weeks   Status Achieved   PT LONG TERM GOAL #3   Title Pt will be able to lay on her R side without interruption of pain in order to increase sleep quality.    Time 12   Period Weeks   Status Achieved   PT LONG TERM GOAL #4   Title Pt will decrease her Fatigue Severity Scale  from 70% to < 50% in order to perform her ADLs, walk and exercise. (6/2: 87%)    Time 12   Period Weeks   Status On-going   PT LONG TERM GOAL #5   Title Pt will increase her LEFS score from 73% to  80% in order to play sports, walk, and squat. (6/2: 93%)    Status Achieved   Additional Long Term Goals   Additional Long Term Goals Yes   PT LONG TERM GOAL #6   Title Pt will demo no lumbopelvic instability with 5 reps of Level 2 and 3 exercsies of the dynamic stabilization exercises in order to minimize injuries risks with fitness training.   Time 12   Period Weeks   Status New               Plan - 10/02/15 1149    Clinical Impression Statement Pt was able to achieve underwater breathing through her nose  without swallowing water and was able to coordinate 4 reps of proper arm +underwater breaststroke technique . Next session swill be on land and plan to assess pt 's alignment and form w/ weight machines and the routine she is performing  with her trainer. Pt is progressing well towards her goals.    Rehab Potential Excellent   PT Frequency 2x / week   PT Duration 12 weeks   PT Treatment/Interventions ADLs/Self Care Home Management;Aquatic Therapy;Cryotherapy;Balance training;Neuromuscular re-education;Gait training;Therapeutic exercise;Therapeutic activities;Functional mobility training;Stair training;Patient/family education;Manual techniques;Taping;Energy conservation;Scar mobilization   Consulted and Agree with Plan of Care Patient      Patient will benefit from skilled therapeutic intervention in order to improve the following deficits and impairments:  Decreased range of motion, Decreased strength, Impaired UE functional use, Hypomobility, Abnormal gait, Decreased activity tolerance, Improper body mechanics, Pain, Postural dysfunction, Increased muscle spasms, Decreased safety awareness, Decreased coordination, Decreased endurance, Decreased scar mobility  Visit Diagnosis: Pain in right hip  Pain in right shoulder  Other abnormalities of gait and mobility  Unspecified lack of coordination     Problem List Patient Active Problem List   Diagnosis Date Noted  . Obesity (BMI 35.0-39.9 without comorbidity) ([place]) 03/20/2015  . Leg edema, right 10/03/2014  . Genetic testing 07/10/2014  . Breast cancer of lower-inner quadrant of right female breast ([place]) 05/29/2014    [name] ,PT, DPT, E-RYT  10/02/2015, 11:51 AM  [place] MAIN [facility] SERVICES [address] [place], [place], [address] Phone: [phone]   Fax:  [phone]  Name: WAYLYNN BENEFIEL MRN: [id number] Date of Birth: 04/13/1971

## 2015-10-06 ENCOUNTER — Ambulatory Visit: Payer: BLUE CROSS/BLUE SHIELD | Admitting: Occupational Therapy

## 2015-10-06 DIAGNOSIS — I890 Lymphedema, not elsewhere classified: Secondary | ICD-10-CM

## 2015-10-06 DIAGNOSIS — M25551 Pain in right hip: Secondary | ICD-10-CM | POA: Diagnosis not present

## 2015-10-06 NOTE — Patient Instructions (Addendum)
Pt to phone when she get her Jovi pak breast pad  In and sleeve with glove  Educated pt again on assessing her arm before and after high risk activities

## 2015-10-06 NOTE — Therapy (Signed)
[place] [facility] [address] [place], [address] Phone: [phone]   Fax:  [phone]  Occupational Therapy Treatment  Patient Details  Name: Krystal Kelley MRN: [id number] Date of Birth: 04-Aug-1970 Referring Provider: Lindi Adie  Encounter Date: 10/06/2015      OT End of Session - 10/06/15 1506    Visit Number 2   Number of Visits 3   Date for OT Re-Evaluation 10/27/15   OT Start Time 1403   OT Stop Time 1436   OT Time Calculation (min) 33 min   Activity Tolerance Patient tolerated treatment well   Behavior During Therapy [facility] for tasks assessed/performed      Past Medical History  Diagnosis Date  . Hypertension   . Breast cancer, right breast ([place])   . Radiation 10/30/14-12/17/14    right breast 50.4 gray    Past Surgical History  Procedure Laterality Date  . Bunion surgrery    . Carpel tunnel    . Breast biopsy  05/16/14  . Cholecystectomy  2015  . Portacath placement Left 06/18/2014    Procedure: INSERTION PORT-A-CATH;  Surgeon: Excell Seltzer, MD;  Location: [place];  Service: General;  Laterality: Left;    There were no vitals filed for this visit.      Subjective Assessment - 10/06/15 1409    Subjective  I did not get my garments yet - but the PT told me to come and see you again - and  I got measured few days ago for  my garments -  I had about 3 sessions wtih trainer - trying to loose some of this belly fat - breast and shoulder on R feels some days tighter than others   Patient Stated Goals I do not want lymphedema and wear garments for the rest of my life - want my R shoulder better   Currently in Pain? No/denies             LYMPHEDEMA/ONCOLOGY QUESTIONNAIRE - 10/06/15 1413    Right Upper Extremity Lymphedema   10 cm Proximal to Olecranon Process 40 cm   Olecranon Process 30.2 cm   10 cm Proximal to Ulnar Styloid Process 27.1 cm   Just Proximal to Ulnar  Styloid Process 19.4 cm   At Base of 2nd Digit 6.4 cm   Left Upper Extremity Lymphedema   10 cm Proximal to Olecranon Process 43 cm   Olecranon Process 30 cm   10 cm Proximal to Ulnar Styloid Process 28 cm   Just Proximal to Ulnar Styloid Process 20.5 cm   At Base of 2nd Digit 6.8 cm     pt measurements compare WNL compare to L - and decrease compare to month ago  Pt trying to loose weight  - but not sure if she lost  Breast still increase compare to L  Toy [employer]okey and tighter at times   Pt report she was measured for Jovipak breast pad and sleeve/glove  Pt to phone when arrive for this OT to assess fit and wearing of it Pt to wear breast pad when breast feel tight or heavy   Pt ed on how to assess her arm prior and after high risk ac - like workouts  Working in yard , house work  If increase tightness or heaviness, fullness - wear preventative sleeve and glove   Pt also ed on cont with HEP for shoulder flexion , ABD , int and ext rotation  OT Education - 10/06/15 1505    Education provided Yes   Education Details assess arm before and after high risk activities   Person(s) Educated Patient   Methods Explanation;Verbal cues;Tactile cues;Demonstration   Comprehension Verbalized understanding;Returned demonstration;Verbal cues required          OT Short Term Goals - 10/06/15 1511    OT SHORT TERM GOAL #1   Title Pt R UE elbow to upper arm decrease by 1cm by wearing compression sleeves and MLD    Baseline UE compare R to L - decreased - breast still same- not wearing garments yet   Time 3   Period Weeks   Status On-going           OT Long Term Goals - 10/06/15 1512    OT LONG TERM GOAL #1   Title Pt to be ind in wearing of compression garments and  doing self MLD    Baseline just measured for garments    Time 3   Period Weeks   Status On-going               Plan - 10/06/15 1508    Clinical Impression Statement Pt  measurements in bilateral UE decrease - but R more than L - breast still about the same and fuller feeling - tighter - pt did get measured for Jovipak breast pad and sleeve Marcy Panning - pt to wear Jovipak at night when breast feel more full - and  assess arm prior and after workouts, working in yard or house work - if increase tightenss or full ness afterwards - need to wear sleeve as prevention  - eval forearm measurement wrong R and L -    Rehab Potential Good   OT Frequency 1x / week   OT Duration 4 weeks   OT Treatment/Interventions Self-care/ADL training;Manual lymph drainage;Patient/family education;Therapeutic exercises   Plan Pt to phone when garments in - to educated her on wearingand assess fitting    OT Home Exercise Plan See pt instruction   Consulted and Agree with Plan of Care Patient      Patient will benefit from skilled therapeutic intervention in order to improve the following deficits and impairments:  Impaired UE functional use, Increased edema  Visit Diagnosis: Lymphedema, not elsewhere classified    Problem List Patient Active Problem List   Diagnosis Date Noted  . Obesity (BMI 35.0-39.9 without comorbidity) ([place]) 03/20/2015  . Leg edema, right 10/03/2014  . Genetic testing 07/10/2014  . Breast cancer of lower-inner quadrant of right female breast ([place]) 05/29/2014    Rosalyn Gess OTR/L,CLT  10/06/2015, 3:13 PM  [place] [facility] [address], [place], [address] Phone: [phone]   Fax:  [phone]  Name: JAMEILA FONNESBECK MRN: [id number] Date of Birth: [birth year]

## 2015-10-07 ENCOUNTER — Ambulatory Visit: Payer: BLUE CROSS/BLUE SHIELD | Admitting: Physical Therapy

## 2015-10-07 DIAGNOSIS — R2689 Other abnormalities of gait and mobility: Secondary | ICD-10-CM

## 2015-10-07 DIAGNOSIS — M25551 Pain in right hip: Secondary | ICD-10-CM | POA: Diagnosis not present

## 2015-10-07 DIAGNOSIS — R279 Unspecified lack of coordination: Secondary | ICD-10-CM

## 2015-10-07 DIAGNOSIS — M25511 Pain in right shoulder: Secondary | ICD-10-CM

## 2015-10-08 ENCOUNTER — Ambulatory Visit (INDEPENDENT_AMBULATORY_CARE_PROVIDER_SITE_OTHER): Payer: BLUE CROSS/BLUE SHIELD | Admitting: Podiatry

## 2015-10-08 ENCOUNTER — Encounter: Payer: Self-pay | Admitting: Podiatry

## 2015-10-08 DIAGNOSIS — G5792 Unspecified mononeuropathy of left lower limb: Secondary | ICD-10-CM | POA: Diagnosis not present

## 2015-10-08 NOTE — Therapy (Signed)
[place] MAIN [facility] SERVICES [address] [place], [place], [address] Phone: [phone]   Fax:  [phone]  Physical Therapy Treatment  Patient Details  Name: Krystal Kelley MRN: [id number] Date of Birth: Jul 20, 1970 Referring Provider: Lindi Adie  Encounter Date: 10/07/2015      PT End of Session - 10/08/15 1825    Visit Number 11   Number of Visits 12   Date for PT Re-Evaluation 11/05/15   PT Start Time 7408   PT Stop Time 1550   PT Time Calculation (min) 65 min      Past Medical History  Diagnosis Date  . Hypertension   . Breast cancer, right breast ([place])   . Radiation 10/30/14-12/17/14    right breast 50.4 gray    Past Surgical History  Procedure Laterality Date  . Bunion surgrery    . Carpel tunnel    . Breast biopsy  05/16/14  . Cholecystectomy  2015  . Portacath placement Left 06/18/2014    Procedure: INSERTION PORT-A-CATH;  Surgeon: Excell Seltzer, MD;  Location: [place];  Service: General;  Laterality: Left;    There were no vitals filed for this visit.      Subjective Assessment - 10/07/15 1447    Subjective Pt reported she felt tired after last aquatic session in combination with shoveling mulch, mowed the yard, and rode her bike. With rest, pt recovered 2.5 days later. Today, pt did not go to the gym because she will get a work out with PT.  Pt got her R arm fitted for a sleeve and saw her OT about lymphedema .       Pertinent History Diagnosed with right breast cancer in February 2016; had lumpectomy June 18, 2014 with 4-5 lymph nodes removed (all negative).  Adjuvant chemotherapy (still on herceptin) and radiation, completed August 2016.  Had superficial blood clot in right leg that caused swelling, so was put on Lasix. ON tamoxifen.    Patient Stated Goals move better, return soft ball playing, walk and exercise longer    Currently in Pain? No/denies            [facility] PT Assessment -  10/08/15 1822    Observation/Other Assessments   Observations upper trap overuse with weight training                     [place] [facility] Adult PT Treatment/Exercise - 10/08/15 1823    Therapeutic Activites    Other Therapeutic Activities education about water intake, pacing fitness routine to maintain energy, weight loss pacing    Neuro Re-ed    Neuro Re-ed Details  alignment and deep core coordination training with weight training machines (leg press, chest rows, lat pull down), deep core exercise level 2                   PT Education - 10/08/15 1824    Education provided Yes   Education Details HEP, education on fitness and wellness   Person(s) Educated Patient   Methods Explanation;Demonstration;Tactile cues;Verbal cues;Handout   Comprehension Returned demonstration;Verbalized understanding             PT Long Term Goals - 09/19/15 0921    PT LONG TERM GOAL #1   Title Pt will report being able to walk 5 blocks without pain in order to exercise.   Time 12   Period Weeks   Status Partially Met   PT LONG TERM GOAL #2  Title Pt will have increased functional ROM with RUE for wiping and cleaning back and bottom.    Time 12   Period Weeks   Status Achieved   PT LONG TERM GOAL #3   Title Pt will be able to lay on her R side without interruption of pain in order to increase sleep quality.    Time 12   Period Weeks   Status Achieved   PT LONG TERM GOAL #4   Title Pt will decrease her Fatigue Severity Scale from 70% to < 50% in order to perform her ADLs, walk and exercise. (6/2: 87%)    Time 12   Period Weeks   Status On-going   PT LONG TERM GOAL #5   Title Pt will increase her LEFS score from 73% to  80% in order to play sports, walk, and squat. (6/2: 93%)    Status Achieved   Additional Long Term Goals   Additional Long Term Goals Yes   PT LONG TERM GOAL #6   Title Pt will demo no lumbopelvic instability with 5 reps of Level 2 and 3 exercsies of the  dynamic stabilization exercises in order to minimize injuries risks with fitness training.   Time 12   Period Weeks   Status New               Plan - 10/08/15 1825    Clinical Impression Statement Pt demo'd proper technique and alignment with use of weight machines after training. Moderate cuing for decreased upper trap overuse and increased deep core mm activation was required. Applied biopsychosocial approaches on strategies to help her achieve her wellness goals which included education on sleep hygiene, weight loss over gradual time span with exercise pacing, increased water intake, eating with more whole foods and less processed/ fatty foods. Pt demo'd and voiced understanding.     Rehab Potential Excellent   Clinical Impairments Affecting Rehab Potential none    PT Frequency 2x / week   PT Duration 12 weeks   PT Treatment/Interventions ADLs/Self Care Home Management;Aquatic Therapy;Cryotherapy;Balance training;Neuromuscular re-education;Gait training;Therapeutic exercise;Therapeutic activities;Functional mobility training;Stair training;Patient/family education;Manual techniques;Taping;Energy conservation;Scar mobilization      Patient will benefit from skilled therapeutic intervention in order to improve the following deficits and impairments:  Decreased range of motion, Decreased strength, Impaired UE functional use, Hypomobility, Abnormal gait, Decreased activity tolerance, Improper body mechanics, Pain, Postural dysfunction, Increased muscle spasms, Decreased safety awareness, Decreased coordination, Decreased endurance, Decreased scar mobility  Visit Diagnosis: Pain in right hip  Pain in right shoulder  Other abnormalities of gait and mobility  Unspecified lack of coordination     Problem List Patient Active Problem List   Diagnosis Date Noted  . Obesity (BMI 35.0-39.9 without comorbidity) ([place]) 03/20/2015  . Leg edema, right 10/03/2014  . Genetic testing  07/10/2014  . Breast cancer of lower-inner quadrant of right female breast ([place]) 05/29/2014    [name] 10/08/2015, 8:53 PM  [place] MAIN [facility] SERVICES [address] [place], [place], [address] Phone: [phone]   Fax:  [phone]  Name: Krystal Kelley MRN: [id number] Date of Birth: 1970-05-18

## 2015-10-08 NOTE — Progress Notes (Signed)
She presents today for follow-up of her neuritis and plantar fasciitis of her left heel she is status post chemotherapy for breast cancer.  Objective: Vital signs are stable she is alert and oriented 3. Pulses are palpable. Neurologic sensorium is intact deep tendon reflexes are intact and muscle strength is normal. She has very minimal report duction of pain at this point onto the left heel no pain on the right foot at all. I see no signs of skin breakdown.  Assessment: Neuritis pleural fascitis resolving left.  Plan: We'll allow her to get back to her exercise regimen to help reduce her weight and I will follow-up with her in 6 weeks if need be. Otherwise she will cancel the appointment and call us as needed.

## 2015-10-09 ENCOUNTER — Ambulatory Visit: Payer: BLUE CROSS/BLUE SHIELD | Admitting: Physical Therapy

## 2015-10-09 DIAGNOSIS — R279 Unspecified lack of coordination: Secondary | ICD-10-CM

## 2015-10-09 DIAGNOSIS — M25551 Pain in right hip: Secondary | ICD-10-CM | POA: Diagnosis not present

## 2015-10-09 DIAGNOSIS — R2689 Other abnormalities of gait and mobility: Secondary | ICD-10-CM

## 2015-10-09 DIAGNOSIS — M25511 Pain in right shoulder: Secondary | ICD-10-CM

## 2015-10-09 NOTE — Therapy (Signed)
[place] MAIN [facility] SERVICES [address] [place], [place], [address] Phone: [phone]   Fax:  [phone]  Physical Therapy Treatment  Patient Details  Name: Krystal Kelley MRN: [id number] Date of Birth: 1971/02/03 Referring Provider: Lindi Adie  Encounter Date: 10/09/2015      PT End of Session - 10/09/15 2200    Visit Number 12   Number of Visits 12   Date for PT Re-Evaluation 11/05/15   PT Start Time 1110   PT Stop Time 1200   PT Time Calculation (min) 50 min   Activity Tolerance Patient tolerated treatment well   Behavior During Therapy [name] for tasks assessed/performed      Past Medical History  Diagnosis Date  . Hypertension   . Breast cancer, right breast ([place])   . Radiation 10/30/14-12/17/14    right breast 50.4 gray    Past Surgical History  Procedure Laterality Date  . Bunion surgrery    . Carpel tunnel    . Breast biopsy  05/16/14  . Cholecystectomy  2015  . Portacath placement Left 06/18/2014    Procedure: INSERTION PORT-A-CATH;  Surgeon: Excell Seltzer, MD;  Location: [place];  Service: General;  Laterality: Left;    There were no vitals filed for this visit.      Subjective Assessment - 10/09/15 2159    Subjective Pt reported she did not get a chance to perform the deep core exercises she learned at her land visit yesterday   Pertinent History Diagnosed with right breast cancer in February 2016; had lumpectomy June 18, 2014 with 4-5 lymph nodes removed (all negative).  Adjuvant chemotherapy (still on herceptin) and radiation, completed August 2016.  Had superficial blood clot in right leg that caused swelling, so was put on Lasix. ON tamoxifen.    Patient Stated Goals move better, return soft ball playing, walk and exercise longer                       Adult Aquatic Therapy - 10/09/15 2200    Aquatic Therapy Subjective   Subjective Pt had no complaints.     O: Pt  entered/exited the pool via steps with single UE support on rail.  50 ft =1 lap  Exercises performed in 3'6" depth  5' walking forward and back ward    Exercises performed in 4'6" depth  30 sec jogging , 30 sec blowing bubbles with face under water as rest break  X 10 sets 30 sec threading water with breast stroke UE and rest break, x 7 sets,  30 sec threading water with breast stroke UE/ LE x 3 sets  Exercises performed in 3'6" depth  2 laps of practicing complete breast stroke under with cues  Stretches and floating rest period 5'       PT Education - 10/08/15 1824    Education provided Yes   Education Details HEP, education on fitness and wellness   Person(s) Educated Patient   Methods Explanation;Demonstration;Tactile cues;Verbal cues;Handout   Comprehension Returned demonstration;Verbalized understanding             PT Long Term Goals - 09/19/15 0921    PT LONG TERM GOAL #1   Title Pt will report being able to walk 5 blocks without pain in order to exercise.   Time 12   Period Weeks   Status Partially Met   PT LONG TERM GOAL #2   Title Pt will have increased functional  ROM with RUE for wiping and cleaning back and bottom.    Time 12   Period Weeks   Status Achieved   PT LONG TERM GOAL #3   Title Pt will be able to lay on her R side without interruption of pain in order to increase sleep quality.    Time 12   Period Weeks   Status Achieved   PT LONG TERM GOAL #4   Title Pt will decrease her Fatigue Severity Scale from 70% to < 50% in order to perform her ADLs, walk and exercise. (6/2: 87%)    Time 12   Period Weeks   Status On-going   PT LONG TERM GOAL #5   Title Pt will increase her LEFS score from 73% to  80% in order to play sports, walk, and squat. (6/2: 93%)    Status Achieved   Additional Long Term Goals   Additional Long Term Goals Yes   PT LONG TERM GOAL #6   Title Pt will demo no lumbopelvic instability with 5 reps of Level 2 and 3 exercsies  of the dynamic stabilization exercises in order to minimize injuries risks with fitness training.   Time 12   Period Weeks   Status New               Plan - 10/09/15 2201    Clinical Impression Statement Pt achieved the ability to swim breast stroke with proper coordination with UE/LE and breathing today. Pt also did not fatigue with HIIT exercises. Pt continues to benefit from skilled PT to learn how to incorporate aerobic activity into her workout for weight loss and maintaining ROM.    Rehab Potential Excellent   Clinical Impairments Affecting Rehab Potential none    PT Frequency 2x / week   PT Duration 12 weeks   PT Treatment/Interventions ADLs/Self Care Home Management;Aquatic Therapy;Cryotherapy;Balance training;Neuromuscular re-education;Gait training;Therapeutic exercise;Therapeutic activities;Functional mobility training;Stair training;Patient/family education;Manual techniques;Taping;Energy conservation;Scar mobilization      Patient will benefit from skilled therapeutic intervention in order to improve the following deficits and impairments:  Decreased range of motion, Decreased strength, Impaired UE functional use, Hypomobility, Abnormal gait, Decreased activity tolerance, Improper body mechanics, Pain, Postural dysfunction, Increased muscle spasms, Decreased safety awareness, Decreased coordination, Decreased endurance, Decreased scar mobility  Visit Diagnosis: Pain in right hip  Pain in right shoulder  Other abnormalities of gait and mobility  Unspecified lack of coordination     Problem List Patient Active Problem List   Diagnosis Date Noted  . Obesity (BMI 35.0-39.9 without comorbidity) ([place]) 03/20/2015  . Leg edema, right 10/03/2014  . Genetic testing 07/10/2014  . Breast cancer of lower-inner quadrant of right female breast ([place]) 05/29/2014    [name] ,PT, DPT, E-RYT  10/09/2015, 10:03 PM  [place] MAIN  [facility] SERVICES [address] [place], [place], [address] Phone: [phone]   Fax:  [phone]  Name: Krystal Kelley MRN: [id number] Date of Birth: 06-29-1970

## 2015-10-14 ENCOUNTER — Ambulatory Visit: Payer: BLUE CROSS/BLUE SHIELD | Admitting: Physical Therapy

## 2015-10-15 ENCOUNTER — Telehealth: Payer: Self-pay

## 2015-10-15 ENCOUNTER — Ambulatory Visit: Payer: BLUE CROSS/BLUE SHIELD | Admitting: Physical Therapy

## 2015-10-15 ENCOUNTER — Other Ambulatory Visit: Payer: Self-pay | Admitting: Hematology and Oncology

## 2015-10-15 DIAGNOSIS — M25551 Pain in right hip: Secondary | ICD-10-CM

## 2015-10-15 DIAGNOSIS — M25511 Pain in right shoulder: Secondary | ICD-10-CM

## 2015-10-15 DIAGNOSIS — R2689 Other abnormalities of gait and mobility: Secondary | ICD-10-CM

## 2015-10-15 DIAGNOSIS — R279 Unspecified lack of coordination: Secondary | ICD-10-CM

## 2015-10-15 NOTE — Therapy (Signed)
[place] MAIN [facility] SERVICES [address] [place], [place], [address] Phone: [phone]   Fax:  [phone]  Physical Therapy Treatment  Patient Details  Name: Krystal Kelley MRN: [id number] Date of Birth: Aug 20, 1970 Referring Provider: Lindi Adie  Encounter Date: 10/15/2015      PT End of Session - 10/15/15 0071    Visit Number 13   Date for PT Re-Evaluation 11/05/15   PT Start Time 1506   PT Stop Time 1559   PT Time Calculation (min) 53 min   Activity Tolerance Patient tolerated treatment well   Behavior During Therapy [facility] for tasks assessed/performed      Past Medical History  Diagnosis Date  . Hypertension   . Breast cancer, right breast ([place])   . Radiation 10/30/14-12/17/14    right breast 50.4 gray    Past Surgical History  Procedure Laterality Date  . Bunion surgrery    . Carpel tunnel    . Breast biopsy  05/16/14  . Cholecystectomy  2015  . Portacath placement Left 06/18/2014    Procedure: INSERTION PORT-A-CATH;  Surgeon: Excell Seltzer, MD;  Location: [place];  Service: General;  Laterality: Left;    There were no vitals filed for this visit.      Subjective Assessment - 10/15/15 1545    Subjective Pt reported she have been trying to rest while caring for her god-baby.    Pertinent History Diagnosed with right breast cancer in February 2016; had lumpectomy June 18, 2014 with 4-5 lymph nodes removed (all negative).  Adjuvant chemotherapy (still on herceptin) and radiation, completed August 2016.  Had superficial blood clot in right leg that caused swelling, so was put on Lasix. ON tamoxifen.    Patient Stated Goals move better, return soft ball playing, walk and exercise longer                                  PT Education - 10/15/15 1554    Education provided Yes   Education Details HEP   Person(s) Educated Patient   Methods Explanation;Demonstration;Tactile cues;Verbal  cues;Handout   Comprehension Returned demonstration             PT Long Term Goals - 10/15/15 1550    PT LONG TERM GOAL #1   Title Pt will report being able to walk 5 blocks without pain in order to exercise.   Time 12   Period Weeks   Status Partially Met   PT LONG TERM GOAL #2   Title Pt will have increased functional ROM with RUE for wiping and cleaning back and bottom.    Time 12   Period Weeks   Status Achieved   PT LONG TERM GOAL #3   Title Pt will be able to lay on her R side without interruption of pain in order to increase sleep quality.    Time 12   Period Weeks   Status Achieved   PT LONG TERM GOAL #4   Title Pt will decrease her Fatigue Severity Scale from 70% to < 50% in order to perform her ADLs, walk and exercise. (6/2: 87%)    Time 12   Period Weeks   Status On-going   PT LONG TERM GOAL #5   Title Pt will increase her LEFS score from 73% to  80% in order to play sports, walk, and squat. (6/2: 93%)    Status  Achieved   PT LONG TERM GOAL #6   Title Pt will demo no lumbopelvic instability with 5 reps of Level 2 and 3 exercsies of the dynamic stabilization exercises in order to minimize injuries risks with fitness training.   Time 12   Period Weeks   Status New               Plan - 10/15/15 1555    Clinical Impression Statement Pt performed deep core exercises correctly after cuing for dissassociation of pelvic from trunk. Pt will continue to benefit skilled PT.    Rehab Potential Excellent   Clinical Impairments Affecting Rehab Potential none    PT Frequency 2x / week   PT Duration 12 weeks   PT Treatment/Interventions ADLs/Self Care Home Management;Aquatic Therapy;Cryotherapy;Balance training;Neuromuscular re-education;Gait training;Therapeutic exercise;Therapeutic activities;Functional mobility training;Stair training;Patient/family education;Manual techniques;Taping;Energy conservation;Scar mobilization      Patient will benefit from skilled  therapeutic intervention in order to improve the following deficits and impairments:  Decreased range of motion, Decreased strength, Impaired UE functional use, Hypomobility, Abnormal gait, Decreased activity tolerance, Improper body mechanics, Pain, Postural dysfunction, Increased muscle spasms, Decreased safety awareness, Decreased coordination, Decreased endurance, Decreased scar mobility  Visit Diagnosis: Pain in right hip  Pain in right shoulder  Other abnormalities of gait and mobility  Unspecified lack of coordination     Problem List Patient Active Problem List   Diagnosis Date Noted  . Obesity (BMI 35.0-39.9 without comorbidity) ([place]) 03/20/2015  . Leg edema, right 10/03/2014  . Genetic testing 07/10/2014  . Breast cancer of lower-inner quadrant of right female breast ([place]) 05/29/2014    [name] ,PT, DPT, E-RYT  10/15/2015, 3:58 PM  [place] MAIN [facility] SERVICES [address] [place], [place], [address] Phone: [phone]   Fax:  [phone]  Name: Krystal Kelley MRN: [id number] Date of Birth: 03/20/1971

## 2015-10-15 NOTE — Telephone Encounter (Signed)
Received VM from pt requesting call back to discuss some things.  Upon call, pt states she was stung last Friday while mowing the yard.  Pt states she just wanted to let us know about it.  Pt states it is not on her affected arm and it has improved since last week.  Pt instructed she could take benadryl or try an anti-itch cream if she wished.  I also offered for pt to be seen tomorrow if she wished to be evaluated.  Pt denies needing to be seen but verbalized understanding to call us should she notice any changes or have any further questions.

## 2015-10-15 NOTE — Patient Instructions (Addendum)
Deep core level 2 (knee out)  30 reps x 2 x day    Red band:  Seated or standing:  Red wrapped around the L foot, R hand pull "pulling the lawn mower" 10 x 3/ 1 x day        Standing: "Pulling laundry off line" 10 x 3/ 1 x day        Balancing on one leg with bicep curls 10 x 3/ 1 x day    ____________ Log roll out of bed

## 2015-10-16 ENCOUNTER — Other Ambulatory Visit: Payer: Self-pay | Admitting: *Deleted

## 2015-10-16 DIAGNOSIS — C50311 Malignant neoplasm of lower-inner quadrant of right female breast: Secondary | ICD-10-CM

## 2015-10-16 MED ORDER — FUROSEMIDE 40 MG PO TABS: 40.0000 mg | ORAL_TABLET | Freq: Every day | ORAL | Status: DC | PRN

## 2015-10-22 ENCOUNTER — Ambulatory Visit: Payer: BLUE CROSS/BLUE SHIELD | Attending: Hematology and Oncology | Admitting: Physical Therapy

## 2015-10-22 DIAGNOSIS — M25551 Pain in right hip: Secondary | ICD-10-CM | POA: Diagnosis present

## 2015-10-22 DIAGNOSIS — R279 Unspecified lack of coordination: Secondary | ICD-10-CM | POA: Insufficient documentation

## 2015-10-22 DIAGNOSIS — R2689 Other abnormalities of gait and mobility: Secondary | ICD-10-CM | POA: Diagnosis present

## 2015-10-22 DIAGNOSIS — M25511 Pain in right shoulder: Secondary | ICD-10-CM | POA: Insufficient documentation

## 2015-10-22 NOTE — Patient Instructions (Addendum)
Restorative yoga [email or web address]-library/[email or web address]/[email or web address]-[email or web address]/  Legs propped on pillows/wall 10 mins -set alarm on phone   Get settled to relax all parts fo your back body,  (eye pillow, blanket)  Allow for your body to relax  Transition slowly after alarms sets off.   ____________  Continue with level 2 (knee out one at a time)  30 reps in the morning and 30 reps at night     ____________ Continue to have good sleep strategies as discussed

## 2015-10-23 ENCOUNTER — Ambulatory Visit: Payer: BLUE CROSS/BLUE SHIELD | Admitting: Physical Therapy

## 2015-10-23 NOTE — Therapy (Signed)
[place] MAIN [facility] SERVICES [address] [place], [place], [address] Phone: [phone]   Fax:  [phone]  Physical Therapy Treatment  Patient Details  Name: Krystal Kelley MRN: [id number] Date of Birth: April 03, 1971 Referring Provider: Lindi Adie  Encounter Date: 10/22/2015      PT End of Session - 10/22/15 1451    Visit Number 14   Date for PT Re-Evaluation 11/05/15   PT Start Time 1510   PT Stop Time 1600   PT Time Calculation (min) 50 min      Past Medical History  Diagnosis Date  . Hypertension   . Breast cancer, right breast ([place])   . Radiation 10/30/14-12/17/14    right breast 50.4 gray    Past Surgical History  Procedure Laterality Date  . Bunion surgrery    . Carpel tunnel    . Breast biopsy  05/16/14  . Cholecystectomy  2015  . Portacath placement Left 06/18/2014    Procedure: INSERTION PORT-A-CATH;  Surgeon: Excell Seltzer, MD;  Location: [place];  Service: General;  Laterality: Left;    There were no vitals filed for this visit.      Subjective Assessment - 10/22/15 1408    Subjective Pt reported she feels tired after participating many community events with family the past few days.  Pt did alot of walking and she pushed herself to walk longer.  Pt feels more tired now than she did when going through chemo. Pt states she is trying to do more and that is what is wiping her out.  Her sleep has been interrupted as she has been caring for her newborn godchild.     Pertinent History Diagnosed with right breast cancer in February 2016; had lumpectomy June 18, 2014 with 4-5 lymph nodes removed (all negative).  Adjuvant chemotherapy (still on herceptin) and radiation, completed August 2016.  Had superficial blood clot in right leg that caused swelling, so was put on Lasix. ON tamoxifen.    Patient Stated Goals move better, return soft ball playing, walk and exercise longer             [facility] PT  Assessment - 10/23/15 1057    Observation/Other Assessments   Observations pt yawning upon arrival, pt appeared more alert following relaxation training with restorative yoga                      [facility] Adult PT Treatment/Exercise - 10/23/15 1058    Therapeutic Activites    Other Therapeutic Activities education on water intake, sleep hygiene, fatigue management    Neuro Re-ed    Neuro Re-ed Details  education on restorative yoga , research on its positive effects for cancer survivors, boosting immune system, minimizing fatigue  deep core level progressions                PT Education - 10/22/15 1451    Education provided Yes   Education Details HEP   Person(s) Educated Patient   Methods Explanation;Demonstration;Tactile cues;Verbal cues;Handout   Comprehension Returned demonstration;Verbalized understanding             PT Long Term Goals - 10/22/15 1412    PT LONG TERM GOAL #1   Title Pt will report being able to walk 5 blocks without pain in order to exercise.   Time 12   Period Weeks   Status Partially Met   PT LONG TERM GOAL #2   Title Pt will have  increased functional ROM with RUE for wiping and cleaning back and bottom.    Time 12   Period Weeks   Status Achieved   PT LONG TERM GOAL #3   Title Pt will be able to lay on her R side without interruption of pain in order to increase sleep quality.    Time 12   Period Weeks   Status Achieved   PT LONG TERM GOAL #4   Title Pt will decrease her Fatigue Severity Scale from 70% to < 50% in order to perform her ADLs, walk and exercise. (6/2: 87%)    Time 12   Period Weeks   Status On-going   PT LONG TERM GOAL #5   Title Pt will increase her LEFS score from 73% to  80% in order to play sports, walk, and squat. (6/2: 93%)    Status Achieved   PT LONG TERM GOAL #6   Title Pt will demo no lumbopelvic instability with 5 reps of Level 2 and 3 exercsies of the dynamic stabilization exercises in order to  minimize injuries risks with fitness training.   Time 12   Period Weeks   Status New               Plan - 10/23/15 1100    Clinical Impression Statement Pt showed more alertness following restorative yoga training and pt voiced understanding to education on fatigue management strategies. Pt demo'd lumbopelvic pertubrations with deep core level 3 and therefore was not advanced to this progress yet. Pt will continue to benefit from skilled PT.    Rehab Potential Excellent   Clinical Impairments Affecting Rehab Potential none    PT Frequency 2x / week   PT Duration 12 weeks   PT Treatment/Interventions ADLs/Self Care Home Management;Aquatic Therapy;Cryotherapy;Balance training;Neuromuscular re-education;Gait training;Therapeutic exercise;Therapeutic activities;Functional mobility training;Stair training;Patient/family education;Manual techniques;Taping;Energy conservation;Scar mobilization      Patient will benefit from skilled therapeutic intervention in order to improve the following deficits and impairments:  Decreased range of motion, Decreased strength, Impaired UE functional use, Hypomobility, Abnormal gait, Decreased activity tolerance, Improper body mechanics, Pain, Postural dysfunction, Increased muscle spasms, Decreased safety awareness, Decreased coordination, Decreased endurance, Decreased scar mobility  Visit Diagnosis: Pain in right hip  Pain in right shoulder  Other abnormalities of gait and mobility  Unspecified lack of coordination     Problem List Patient Active Problem List   Diagnosis Date Noted  . Obesity (BMI 35.0-39.9 without comorbidity) ([place]) 03/20/2015  . Leg edema, right 10/03/2014  . Genetic testing 07/10/2014  . Breast cancer of lower-inner quadrant of right female breast ([place]) 05/29/2014    [name] ,PT, DPT, E-RYT  10/23/2015, 11:09 AM  [place] MAIN [facility] SERVICES [address]  [place], [place], [address] Phone: [phone]   Fax:  [phone]  Name: Krystal Kelley MRN: [id number] Date of Birth: Aug 27, 1970

## 2015-10-28 ENCOUNTER — Ambulatory Visit: Payer: BLUE CROSS/BLUE SHIELD | Admitting: Physical Therapy

## 2015-10-28 DIAGNOSIS — M25511 Pain in right shoulder: Secondary | ICD-10-CM

## 2015-10-28 DIAGNOSIS — M25551 Pain in right hip: Secondary | ICD-10-CM

## 2015-10-28 DIAGNOSIS — R279 Unspecified lack of coordination: Secondary | ICD-10-CM

## 2015-10-28 DIAGNOSIS — R2689 Other abnormalities of gait and mobility: Secondary | ICD-10-CM

## 2015-10-28 NOTE — Therapy (Signed)
[place] MAIN [facility] SERVICES [address] [place], [place], [address] Phone: [phone]   Fax:  [phone]  Physical Therapy Treatment  Patient Details  Name: Krystal Kelley MRN: [id number] Date of Birth: 10-25-1970 Referring Provider: Lindi Adie  Encounter Date: 10/28/2015      PT End of Session - 10/28/15 0936    Visit Number 15   Date for PT Re-Evaluation 11/05/15   PT Start Time 0850   PT Stop Time 0930   PT Time Calculation (min) 40 min   Activity Tolerance Patient tolerated treatment well;No increased pain   Behavior During Therapy [facility] for tasks assessed/performed      Past Medical History  Diagnosis Date  . Hypertension   . Breast cancer, right breast ([place])   . Radiation 10/30/14-12/17/14    right breast 50.4 gray    Past Surgical History  Procedure Laterality Date  . Bunion surgrery    . Carpel tunnel    . Breast biopsy  05/16/14  . Cholecystectomy  2015  . Portacath placement Left 06/18/2014    Procedure: INSERTION PORT-A-CATH;  Surgeon: Excell Seltzer, MD;  Location: [place];  Service: General;  Laterality: Left;    There were no vitals filed for this visit.      Subjective Assessment - 10/28/15 0934    Subjective Pt reported she is tired this morning because she could not get her god baby to go back to sleep for 4 hrs.     Pertinent History Diagnosed with right breast cancer in February 2016; had lumpectomy June 18, 2014 with 4-5 lymph nodes removed (all negative).  Adjuvant chemotherapy (still on herceptin) and radiation, completed August 2016.  Had superficial blood clot in right leg that caused swelling, so was put on Lasix. ON tamoxifen.    Patient Stated Goals move better, return soft ball playing, walk and exercise longer                      Adult Aquatic Therapy - 10/28/15 0936    Aquatic Therapy Subjective   Subjective Pt had no complaints.        O: Pt entered/exited  the pool via steps with single UE support on rail.  50 ft =1 lap  Exercises performed in 3'6" depth   2 laps walking forward 2 laps sidestepping _ squat both directions  4 min on bike  3 min rest 3 min on bike  3 min rest    Exercises performed in 4'6" depth   2 laps hopping forward  3 min hopping side to side    10 min rest               PT Long Term Goals - 10/22/15 1412    PT LONG TERM GOAL #1   Title Pt will report being able to walk 5 blocks without pain in order to exercise.   Time 12   Period Weeks   Status Partially Met   PT LONG TERM GOAL #2   Title Pt will have increased functional ROM with RUE for wiping and cleaning back and bottom.    Time 12   Period Weeks   Status Achieved   PT LONG TERM GOAL #3   Title Pt will be able to lay on her R side without interruption of pain in order to increase sleep quality.    Time 12   Period Weeks   Status Achieved   PT  LONG TERM GOAL #4   Title Pt will decrease her Fatigue Severity Scale from 70% to < 50% in order to perform her ADLs, walk and exercise. (6/2: 87%)    Time 12   Period Weeks   Status On-going   PT LONG TERM GOAL #5   Title Pt will increase her LEFS score from 73% to  80% in order to play sports, walk, and squat. (6/2: 93%)    Status Achieved   PT LONG TERM GOAL #6   Title Pt will demo no lumbopelvic instability with 5 reps of Level 2 and 3 exercsies of the dynamic stabilization exercises in order to minimize injuries risks with fitness training.   Time 12   Period Weeks   Status New               Plan - 10/28/15 7741    Clinical Impression Statement Pt tolerated session without complaints. Initiated water cycling int HIIT workout today. Pt was reminded to f/u her OT for compression sleeve fitting. Pt is progressing well towards her goals.    Rehab Potential Excellent   Clinical Impairments Affecting Rehab Potential none    PT Frequency 2x / week   PT Duration 12 weeks   PT  Treatment/Interventions ADLs/Self Care Home Management;Aquatic Therapy;Cryotherapy;Balance training;Neuromuscular re-education;Gait training;Therapeutic exercise;Therapeutic activities;Functional mobility training;Stair training;Patient/family education;Manual techniques;Taping;Energy conservation;Scar mobilization      Patient will benefit from skilled therapeutic intervention in order to improve the following deficits and impairments:  Decreased range of motion, Decreased strength, Impaired UE functional use, Hypomobility, Abnormal gait, Decreased activity tolerance, Improper body mechanics, Pain, Postural dysfunction, Increased muscle spasms, Decreased safety awareness, Decreased coordination, Decreased endurance, Decreased scar mobility  Visit Diagnosis: Pain in right hip  Pain in right shoulder  Other abnormalities of gait and mobility  Unspecified lack of coordination     Problem List Patient Active Problem List   Diagnosis Date Noted  . Obesity (BMI 35.0-39.9 without comorbidity) ([place]) 03/20/2015  . Leg edema, right 10/03/2014  . Genetic testing 07/10/2014  . Breast cancer of lower-inner quadrant of right female breast ([place]) 05/29/2014    [name] ,PT, DPT, E-RYT  10/28/2015, 9:37 AM  [place] MAIN [facility] SERVICES [address] [place], [place], [address] Phone: [phone]   Fax:  [phone]  Name: AVEREE HARB MRN: [id number] Date of Birth: 1970/12/14

## 2015-10-30 ENCOUNTER — Other Ambulatory Visit: Payer: Self-pay | Admitting: Gastroenterology

## 2015-11-04 ENCOUNTER — Ambulatory Visit: Payer: BLUE CROSS/BLUE SHIELD | Admitting: Physical Therapy

## 2015-11-04 DIAGNOSIS — R279 Unspecified lack of coordination: Secondary | ICD-10-CM

## 2015-11-04 DIAGNOSIS — R2689 Other abnormalities of gait and mobility: Secondary | ICD-10-CM

## 2015-11-04 DIAGNOSIS — M25551 Pain in right hip: Secondary | ICD-10-CM

## 2015-11-04 DIAGNOSIS — M25511 Pain in right shoulder: Secondary | ICD-10-CM

## 2015-11-05 NOTE — Therapy (Signed)
[place] MAIN [facility] SERVICES [address] [place], [place], [address] Phone: [phone]   Fax:  [phone]  Physical Therapy Treatment  Patient Details  Name: Krystal Kelley MRN: [id number] Date of Birth: Dec 15, 1970 Referring Provider: Lindi Adie  Encounter Date: 11/04/2015      PT End of Session - 11/05/15 0828    Visit Number 16   Date for PT Re-Evaluation 11/05/15   PT Start Time 0845   PT Stop Time 0935   PT Time Calculation (min) 50 min   Activity Tolerance Patient tolerated treatment well;No increased pain   Behavior During Therapy [facility] for tasks assessed/performed      Past Medical History  Diagnosis Date  . Hypertension   . Breast cancer, right breast ([place])   . Radiation 10/30/14-12/17/14    right breast 50.4 gray    Past Surgical History  Procedure Laterality Date  . Bunion surgrery    . Carpel tunnel    . Breast biopsy  05/16/14  . Cholecystectomy  2015  . Portacath placement Left 06/18/2014    Procedure: INSERTION PORT-A-CATH;  Surgeon: Excell Seltzer, MD;  Location: [place];  Service: General;  Laterality: Left;    There were no vitals filed for this visit.      Subjective Assessment - 11/05/15 0824    Subjective Pt reported she still have disruptive sleep. Pt wants to practice breast stroke in the pool for endurance training in her fitness routine   Pertinent History Diagnosed with right breast cancer in February 2016; had lumpectomy June 18, 2014 with 4-5 lymph nodes removed (all negative).  Adjuvant chemotherapy (still on herceptin) and radiation, completed August 2016.  Had superficial blood clot in right leg that caused swelling, so was put on Lasix. ON tamoxifen.    Patient Stated Goals move better, return soft ball playing, walk and exercise longer                      Adult Aquatic Therapy - 11/05/15 0828    Aquatic Therapy Subjective   Subjective Pt had no complaints.         O: Pt entered/exited the pool via steps with single UE support on rail.  50 ft =1 lap 3 min x 3 sets with bicycling   Exercises performed in 4'6" depth  4 laps walking forward    treading water  2 min  with 2 min rests x 3 sets   4 laps coordinating breathing with arm portion of breast stroke technique with moderate cuing 4 laps coordinating  leg stroke portion of breast stroke with excessive cuing 6 laps coordination full stroke of breast stroke   cool down with floating on noodles              PT Long Term Goals - 11/05/15 0829    PT LONG TERM GOAL #1   Title Pt will report being able to walk 5 blocks without pain in order to exercise.   Time 12   Period Weeks   Status Partially Met   PT LONG TERM GOAL #2   Title Pt will have increased functional ROM with RUE for wiping and cleaning back and bottom.    Time 12   Period Weeks   Status Achieved   PT LONG TERM GOAL #3   Title Pt will be able to lay on her R side without interruption of pain in order to increase sleep quality.  Time 12   Period Weeks   Status Achieved   PT LONG TERM GOAL #4   Title Pt will decrease her Fatigue Severity Scale from 70% to < 50% in order to perform her ADLs, walk and exercise. (6/2: 87%)    Time 12   Period Weeks   Status On-going   PT LONG TERM GOAL #5   Title Pt will increase her LEFS score from 73% to  80% in order to play sports, walk, and squat. (6/2: 93%)    Status Achieved   PT LONG TERM GOAL #6   Title Pt will demo no lumbopelvic instability with 5 reps of Level 2 and 3 exercsies of the dynamic stabilization exercises in order to minimize injuries risks with fitness training.   Time 12   Period Weeks   Status Partially Met               Plan - 11/05/15 0831    Clinical Impression Statement Pt has achieved 3/6 goals. Pt's endurance level with exercises have increased with pool therapy and is progressing well towards strengthening exercises on land to  achieve her remaining goals. Pt would benefit from continuation with skilled PT.    Rehab Potential Excellent   Clinical Impairments Affecting Rehab Potential none    PT Frequency 2x / week   PT Duration 12 weeks   PT Treatment/Interventions ADLs/Self Care Home Management;Aquatic Therapy;Cryotherapy;Balance training;Neuromuscular re-education;Gait training;Therapeutic exercise;Therapeutic activities;Functional mobility training;Stair training;Patient/family education;Manual techniques;Taping;Energy conservation;Scar mobilization      Patient will benefit from skilled therapeutic intervention in order to improve the following deficits and impairments:  Decreased range of motion, Decreased strength, Impaired UE functional use, Hypomobility, Abnormal gait, Decreased activity tolerance, Improper body mechanics, Pain, Postural dysfunction, Increased muscle spasms, Decreased safety awareness, Decreased coordination, Decreased endurance, Decreased scar mobility  Visit Diagnosis: Pain in right hip  Pain in right shoulder  Other abnormalities of gait and mobility  Unspecified lack of coordination     Problem List Patient Active Problem List   Diagnosis Date Noted  . Obesity (BMI 35.0-39.9 without comorbidity) ([place]) 03/20/2015  . Leg edema, right 10/03/2014  . Genetic testing 07/10/2014  . Breast cancer of lower-inner quadrant of right female breast ([place]) 05/29/2014    Krystal Kelley ,PT, DPT, E-RYT  11/05/2015, 8:32 AM  [place] MAIN [facility] SERVICES [address] [place], [place], [address] Phone: [phone]   Fax:  [phone]  Name: Krystal Kelley MRN: [id number] Date of Birth: 09/13/1970

## 2015-11-06 ENCOUNTER — Ambulatory Visit: Payer: BLUE CROSS/BLUE SHIELD | Admitting: Physical Therapy

## 2015-11-06 DIAGNOSIS — R279 Unspecified lack of coordination: Secondary | ICD-10-CM

## 2015-11-06 DIAGNOSIS — M25511 Pain in right shoulder: Secondary | ICD-10-CM

## 2015-11-06 DIAGNOSIS — M25551 Pain in right hip: Secondary | ICD-10-CM | POA: Diagnosis not present

## 2015-11-06 DIAGNOSIS — R2689 Other abnormalities of gait and mobility: Secondary | ICD-10-CM

## 2015-11-06 NOTE — Therapy (Signed)
[place] MAIN [facility] SERVICES [address] [place], [place], [address] Phone: [phone]   Fax:  [phone]  Physical Therapy Treatment  Patient Details  Name: Krystal Kelley MRN: [id number] Date of Birth: [birth year] Referring Provider: Lindi Adie  Encounter Date: 11/06/2015      PT End of Session - 11/06/15 1139    Visit Number 17   Date for PT Re-Evaluation 01/14/16   PT Start Time 1020   PT Stop Time 1105   PT Time Calculation (min) 45 min      Past Medical History  Diagnosis Date  . Hypertension   . Breast cancer, right breast ([place])   . Radiation 10/30/14-12/17/14    right breast 50.4 gray    Past Surgical History  Procedure Laterality Date  . Bunion surgrery    . Carpel tunnel    . Breast biopsy  05/16/14  . Cholecystectomy  2015  . Portacath placement Left 06/18/2014    Procedure: INSERTION PORT-A-CATH;  Surgeon: Excell Seltzer, MD;  Location: [place];  Service: General;  Laterality: Left;    There were no vitals filed for this visit.      Subjective Assessment - 11/06/15 1137    Subjective pt reported she took a nap yesterday when she was tired. pt feels she continues to need PT. Her arms have gotten better but she finds the awuatic PT to help with hip ROM.    Pertinent History Diagnosed with right breast cancer in February 2016; had lumpectomy June 18, 2014 with 4-5 lymph nodes removed (all negative).  Adjuvant chemotherapy (still on herceptin) and radiation, completed August 2016.  Had superficial blood clot in right leg that caused swelling, so was put on Lasix. ON tamoxifen.    Patient Stated Goals move better, return soft ball playing, walk and exercise longer                      Adult Aquatic Therapy - 11/06/15 1138    Aquatic Therapy Subjective   Subjective Pt had no complaints.                          PT Long Term Goals - 11/05/15 7673    PT LONG TERM GOAL  #1   Title Pt will report being able to walk 5 blocks without pain in order to exercise.   Time 12   Period Weeks   Status Partially Met   PT LONG TERM GOAL #2   Title Pt will have increased functional ROM with RUE for wiping and cleaning back and bottom.    Time 12   Period Weeks   Status Achieved   PT LONG TERM GOAL #3   Title Pt will be able to lay on her R side without interruption of pain in order to increase sleep quality.    Time 12   Period Weeks   Status Achieved   PT LONG TERM GOAL #4   Title Pt will decrease her Fatigue Severity Scale from 70% to < 50% in order to perform her ADLs, walk and exercise. (6/2: 87%)    Time 12   Period Weeks   Status On-going   PT LONG TERM GOAL #5   Title Pt will increase her LEFS score from 73% to  80% in order to play sports, walk, and squat. (6/2: 93%)    Status Achieved   PT LONG TERM GOAL #6  Title Pt will demo no lumbopelvic instability with 5 reps of Level 2 and 3 exercsies of the dynamic stabilization exercises in order to minimize injuries risks with fitness training.   Time 12   Period Weeks   Status Partially Met               Plan - 11/06/15 1140    Clinical Impression Statement Pt demo increased IND with progression of fitness routine with aerobic warm-up to HIIT and berast stroke (endurance training). However pt required reminder for rest breaks during HIIT . Pt showed good carry over with breast stroke technique without breathing coordination but required cues for leg technique. Pt continues to benefit from post-CA  education on ensuring proper rest and sleep to minimize fatigue and improve sleep for optimal immune system.  function.     Rehab Potential Excellent   Clinical Impairments Affecting Rehab Potential none    PT Frequency 2x / week   PT Duration 12 weeks   PT Treatment/Interventions ADLs/Self Care Home Management;Aquatic Therapy;Cryotherapy;Balance training;Neuromuscular re-education;Gait  training;Therapeutic exercise;Therapeutic activities;Functional mobility training;Stair training;Patient/family education;Manual techniques;Taping;Energy conservation;Scar mobilization      Patient will benefit from skilled therapeutic intervention in order to improve the following deficits and impairments:  Decreased range of motion, Decreased strength, Impaired UE functional use, Hypomobility, Abnormal gait, Decreased activity tolerance, Improper body mechanics, Pain, Postural dysfunction, Increased muscle spasms, Decreased safety awareness, Decreased coordination, Decreased endurance, Decreased scar mobility  Visit Diagnosis: Pain in right hip  Pain in right shoulder  Other abnormalities of gait and mobility  Unspecified lack of coordination     Problem List Patient Active Problem List   Diagnosis Date Noted  . Obesity (BMI 35.0-39.9 without comorbidity) ([place]) 03/20/2015  . Leg edema, right 10/03/2014  . Genetic testing 07/10/2014  . Breast cancer of lower-inner quadrant of right female breast ([place]) 05/29/2014    [name] ,PT, DPT, E-RYT   11/06/2015, 11:44 AM  [place] MAIN [facility] SERVICES [address] [place], [place], [address] Phone: [phone]   Fax:  [phone]  Name: Krystal Kelley MRN: [id number] Date of Birth: [birth year]

## 2015-11-10 ENCOUNTER — Other Ambulatory Visit: Payer: Self-pay | Admitting: Hematology and Oncology

## 2015-11-10 DIAGNOSIS — C50311 Malignant neoplasm of lower-inner quadrant of right female breast: Secondary | ICD-10-CM

## 2015-11-10 NOTE — Telephone Encounter (Signed)
Chart reviewed.

## 2015-11-11 ENCOUNTER — Ambulatory Visit: Payer: BLUE CROSS/BLUE SHIELD | Admitting: Physical Therapy

## 2015-11-11 DIAGNOSIS — M25551 Pain in right hip: Secondary | ICD-10-CM | POA: Diagnosis not present

## 2015-11-11 DIAGNOSIS — R279 Unspecified lack of coordination: Secondary | ICD-10-CM

## 2015-11-11 DIAGNOSIS — M25511 Pain in right shoulder: Secondary | ICD-10-CM

## 2015-11-11 DIAGNOSIS — R2689 Other abnormalities of gait and mobility: Secondary | ICD-10-CM

## 2015-11-11 NOTE — Therapy (Signed)
[place] MAIN [facility] SERVICES [address] [place], [place], [address] Phone: [phone]   Fax:  [phone]  Physical Therapy Treatment  Patient Details  Name: Krystal Kelley MRN: [id number] Date of Birth: November 29, 1970 Referring Provider: Lindi Adie  Encounter Date: 11/11/2015      PT End of Session - 11/11/15 0927    Visit Number 18   Date for PT Re-Evaluation 01/14/16   PT Start Time 0850   PT Stop Time 0930   PT Time Calculation (min) 40 min   Activity Tolerance Patient tolerated treatment well;No increased pain   Behavior During Therapy WFL for tasks assessed/performed      Past Medical History:  Diagnosis Date  . Breast cancer, right breast ([place])   . Hypertension   . Radiation 10/30/14-12/17/14   right breast 50.4 gray    Past Surgical History:  Procedure Laterality Date  . BREAST BIOPSY  05/16/14  . bunion surgrery    . carpel tunnel    . CHOLECYSTECTOMY  2015  . PORTACATH PLACEMENT Left 06/18/2014   Procedure: INSERTION PORT-A-CATH;  Surgeon: Excell Seltzer, MD;  Location: [place];  Service: General;  Laterality: Left;    There were no vitals filed for this visit.      Subjective Assessment - 11/11/15 0925    Subjective Pt reported one night of uninterrupted sleep this past week. Pt continues to nap but she feels her endurance is not up enough to return to an 8 hr work shift.      Pertinent History Diagnosed with right breast cancer in February 2016; had lumpectomy June 18, 2014 with 4-5 lymph nodes removed (all negative).  Adjuvant chemotherapy (still on herceptin) and radiation, completed August 2016.  Had superficial blood clot in right leg that caused swelling, so was put on Lasix. ON tamoxifen.                      Adult Aquatic Therapy - 11/11/15 0927      Aquatic Therapy Subjective   Subjective Pt had no complaints.          O: Pt entered/exited the pool via steps with single  UE support on rail.  50 ft =1 lap  Exercises performed in 4'6" depth   4 walking forward and backward 4 laps walking with dumbbells under water, elbow extended Stretches by the wall  HIIT: Treading water 30 sec , x 3 sets   6 laps breast stroke practice, required cues for chin position    floating rest break 5'              PT Long Term Goals - 11/12/15 4982      PT LONG TERM GOAL #1   Title Pt will report being able to walk 5 blocks without pain in order to exercise.   Time 12   Period Weeks   Status Partially Met     PT LONG TERM GOAL #2   Title Pt will have increased functional ROM with RUE for wiping and cleaning back and bottom.    Time 12   Period Weeks   Status Achieved     PT LONG TERM GOAL #3   Title Pt will be able to lay on her R side without interruption of pain in order to increase sleep quality.    Time 12   Period Weeks   Status Achieved     PT LONG TERM GOAL #4  Title Pt will decrease her Fatigue Severity Scale from 70% to < 50% in order to perform her ADLs, walk and exercise. (6/2: 87%)    Time 12   Period Weeks   Status On-going     PT LONG TERM GOAL #5   Title Pt will increase her LEFS score from 73% to  80% in order to play sports, walk, and squat. (6/2: 93%)    Status Achieved     PT LONG TERM GOAL #6   Title Pt will demo no lumbopelvic instability with 5 reps of Level 2 and 3 exercsies of the dynamic stabilization exercises in order to minimize injuries risks with fitness training.   Time 12   Period Weeks   Status Partially Met     PT LONG TERM GOAL #7   Title Pt will be able to demo the full form of breast stroke without cues for 50 ft (2 laps) without stopping for rest in order to demo IND with endurance training.   Time 12   Period Weeks   Status New            Plan - 11/11/15 8325    Clinical Impression Statement Pt is progressing well with increased endurance and improved form and technique with breast stroke. Pt  required minor cues for head position in breast stroke. Pt will continue to benefit from skilled PT to achieve her goals.    Rehab Potential Excellent   Clinical Impairments Affecting Rehab Potential none    PT Frequency 2x / week   PT Duration 12 weeks   PT Treatment/Interventions ADLs/Self Care Home Management;Aquatic Therapy;Cryotherapy;Balance training;Neuromuscular re-education;Gait training;Therapeutic exercise;Therapeutic activities;Functional mobility training;Stair training;Patient/family education;Manual techniques;Taping;Energy conservation;Scar mobilization      Patient will benefit from skilled therapeutic intervention in order to improve the following deficits and impairments:  Decreased range of motion, Decreased strength, Impaired UE functional use, Hypomobility, Abnormal gait, Decreased activity tolerance, Improper body mechanics, Pain, Postural dysfunction, Increased muscle spasms, Decreased safety awareness, Decreased coordination, Decreased endurance, Decreased scar mobility  Visit Diagnosis: Pain in right hip  Pain in right shoulder  Other abnormalities of gait and mobility  Unspecified lack of coordination     Problem List Patient Active Problem List   Diagnosis Date Noted  . Obesity (BMI 35.0-39.9 without comorbidity) ([place]) 03/20/2015  . Leg edema, right 10/03/2014  . Genetic testing 07/10/2014  . Breast cancer of lower-inner quadrant of right female breast ([place]) 05/29/2014    [name] ,PT, DPT, E-RYT  11/11/2015, 9:28 AM  [place] MAIN [facility] SERVICES [address] [place], [place], [address] Phone: [phone]   Fax:  [phone]  Name: Krystal Kelley MRN: [id number] Date of Birth: March 03, 1971

## 2015-11-14 ENCOUNTER — Ambulatory Visit: Payer: BLUE CROSS/BLUE SHIELD | Admitting: Physical Therapy

## 2015-11-24 ENCOUNTER — Encounter: Payer: Self-pay | Admitting: Podiatry

## 2015-11-24 ENCOUNTER — Ambulatory Visit: Payer: BLUE CROSS/BLUE SHIELD | Admitting: Podiatry

## 2015-11-24 ENCOUNTER — Ambulatory Visit (INDEPENDENT_AMBULATORY_CARE_PROVIDER_SITE_OTHER): Payer: BLUE CROSS/BLUE SHIELD | Admitting: Podiatry

## 2015-11-24 VITALS — BP 123/88 | HR 69 | Resp 12

## 2015-11-24 DIAGNOSIS — M722 Plantar fascial fibromatosis: Secondary | ICD-10-CM

## 2015-11-24 NOTE — Progress Notes (Signed)
She presents today with chief complaint of painful heels bilaterally. She states they have become more touchy.  Objective: Pulses are palpable alert and oriented 3 vital signs are stable alert and oriented 3 she has pain on palpation medial calcaneal tubercle.  Assessment: Plantar fasciitis bilateral.  Plan: Injected bilateral heels today with Kenalog and local anesthetic. Follow up with her in 6 weeks.

## 2015-12-02 ENCOUNTER — Ambulatory Visit: Payer: BLUE CROSS/BLUE SHIELD | Attending: Hematology and Oncology | Admitting: Physical Therapy

## 2015-12-02 DIAGNOSIS — M25511 Pain in right shoulder: Secondary | ICD-10-CM | POA: Insufficient documentation

## 2015-12-02 DIAGNOSIS — R279 Unspecified lack of coordination: Secondary | ICD-10-CM | POA: Diagnosis present

## 2015-12-02 DIAGNOSIS — R2689 Other abnormalities of gait and mobility: Secondary | ICD-10-CM | POA: Insufficient documentation

## 2015-12-02 DIAGNOSIS — M25551 Pain in right hip: Secondary | ICD-10-CM | POA: Insufficient documentation

## 2015-12-03 NOTE — Therapy (Signed)
[place] MAIN [facility] SERVICES [address] [place], [place], [address] Phone: [phone]   Fax:  [phone]  Physical Therapy Treatment  Patient Details  Name: Krystal Kelley MRN: [id number] Date of Birth: 01-12-1971 Referring Provider: Lindi Adie  Encounter Date: 12/02/2015      PT End of Session - 12/03/15 2223    Visit Number 19   Date for PT Re-Evaluation 01/20/16   PT Start Time 1115   PT Stop Time 1155   PT Time Calculation (min) 35 min   Activity Tolerance Patient tolerated treatment well;No increased pain   Behavior During Therapy WFL for tasks assessed/performed      Past Medical History:  Diagnosis Date  . Breast cancer, right breast ([place])   . Hypertension   . Radiation 10/30/14-12/17/14   right breast 50.4 gray    Past Surgical History:  Procedure Laterality Date  . BREAST BIOPSY  05/16/14  . bunion surgrery    . carpel tunnel    . CHOLECYSTECTOMY  2015  . PORTACATH PLACEMENT Left 06/18/2014   Procedure: INSERTION PORT-A-CATH;  Surgeon: Excell Seltzer, MD;  Location: [place];  Service: General;  Laterality: Left;    There were no vitals filed for this visit.      Subjective Assessment - 12/03/15 2207    Subjective Pt reported she has not been doing her land HEP. Pt has met with a nutritionist.    Pertinent History Diagnosed with right breast cancer in February 2016; had lumpectomy June 18, 2014 with 4-5 lymph nodes removed (all negative).  Adjuvant chemotherapy (still on herceptin) and radiation, completed August 2016.  Had superficial blood clot in right leg that caused swelling, so was put on Lasix. ON tamoxifen.                      Adult Aquatic Therapy - 12/03/15 2221      Aquatic Therapy Subjective   Subjective Pt had no complaints.        O: Pt entered/exited the pool via steps with single UE support on rail.  50 ft =1 lap  Exercises performed in 4'6" depth   2 laps  in forward walking  2 laps breast stroke UE/LE techniques Stretches 6 sets 30 sec treading water with 30 sec rest  30 reps step-up/down with dumbbells in hand under water  Relaxation with noodles 5'             PT Education - 12/03/15 2221    Education provided Yes   Education Details HEP   Person(s) Educated Patient   Methods Explanation;Demonstration;Tactile cues;Verbal cues;Handout   Comprehension Verbalized understanding;Returned demonstration;Verbal cues required;Tactile cues required             PT Long Term Goals - 11/12/15 0939      PT LONG TERM GOAL #1   Title Pt will report being able to walk 5 blocks without pain in order to exercise.   Time 12   Period Weeks   Status Partially Met     PT LONG TERM GOAL #2   Title Pt will have increased functional ROM with RUE for wiping and cleaning back and bottom.    Time 12   Period Weeks   Status Achieved     PT LONG TERM GOAL #3   Title Pt will be able to lay on her R side without interruption of pain in order to increase sleep quality.    Time  12   Period Weeks   Status Achieved     PT LONG TERM GOAL #4   Title Pt will decrease her Fatigue Severity Scale from 70% to < 50% in order to perform her ADLs, walk and exercise. (6/2: 87%)    Time 12   Period Weeks   Status On-going     PT LONG TERM GOAL #5   Title Pt will increase her LEFS score from 73% to  80% in order to play sports, walk, and squat. (6/2: 93%)    Status Achieved     PT LONG TERM GOAL #6   Title Pt will demo no lumbopelvic instability with 5 reps of Level 2 and 3 exercsies of the dynamic stabilization exercises in order to minimize injuries risks with fitness training.   Time 12   Period Weeks   Status Partially Met     PT LONG TERM GOAL #7   Title Pt will be able to demo the full form of breast stroke without cues for 50 ft (2 laps) without stopping for rest in order to demo IND with endurance training.   Time 12   Period Weeks    Status New               Plan - 12/03/15 2224    Clinical Impression Statement Pt continues to show understanding of various exercises to create a well-rounded fitness routine. Advanced pt to pylometric exercises today which pt showed no difficulty. Pt voiced understanding and willingness to be compliant with sleep hygiene strategies.    Rehab Potential Excellent   Clinical Impairments Affecting Rehab Potential none    PT Frequency 2x / week   PT Duration 12 weeks   PT Treatment/Interventions ADLs/Self Care Home Management;Aquatic Therapy;Cryotherapy;Balance training;Neuromuscular re-education;Gait training;Therapeutic exercise;Therapeutic activities;Functional mobility training;Stair training;Patient/family education;Manual techniques;Taping;Energy conservation;Scar mobilization      Patient will benefit from skilled therapeutic intervention in order to improve the following deficits and impairments:  Decreased range of motion, Decreased strength, Impaired UE functional use, Hypomobility, Abnormal gait, Decreased activity tolerance, Improper body mechanics, Pain, Postural dysfunction, Increased muscle spasms, Decreased safety awareness, Decreased coordination, Decreased endurance, Decreased scar mobility  Visit Diagnosis: Pain in right hip  Pain in right shoulder  Other abnormalities of gait and mobility  Unspecified lack of coordination     Problem List Patient Active Problem List   Diagnosis Date Noted  . Obesity (BMI 35.0-39.9 without comorbidity) ([place]) 03/20/2015  . Leg edema, right 10/03/2014  . Genetic testing 07/10/2014  . Breast cancer of lower-inner quadrant of right female breast ([place]) 05/29/2014    [name] ,PT, DPT, E-RYT  12/03/2015, 10:25 PM  [place] MAIN [facility] SERVICES [address] [place], [place], [address] Phone: [phone]   Fax:  [phone]  Name: Krystal Kelley MRN: [id number] Date of  Birth: 12-06-1970

## 2015-12-04 ENCOUNTER — Ambulatory Visit: Payer: BLUE CROSS/BLUE SHIELD | Admitting: Physical Therapy

## 2015-12-04 DIAGNOSIS — M25511 Pain in right shoulder: Secondary | ICD-10-CM

## 2015-12-04 DIAGNOSIS — M25551 Pain in right hip: Secondary | ICD-10-CM

## 2015-12-04 DIAGNOSIS — R2689 Other abnormalities of gait and mobility: Secondary | ICD-10-CM

## 2015-12-04 DIAGNOSIS — R279 Unspecified lack of coordination: Secondary | ICD-10-CM

## 2015-12-05 NOTE — Therapy (Addendum)
[place] MAIN [facility] SERVICES [address] [place], [place], [address] Phone: [phone]   Fax:  [phone]  Physical Therapy Treatment  Patient Details  Name: Krystal Kelley MRN: [id number] Date of Birth: [birth year] Referring Provider: Lindi Adie  Encounter Date: 12/04/2015      PT End of Session - 12/05/15 1802    Visit Number 20   Date for PT Re-Evaluation 01/20/16   PT Start Time 1115   PT Stop Time 1205   PT Time Calculation (min) 50 min   Activity Tolerance Patient tolerated treatment well;No increased pain   Behavior During Therapy WFL for tasks assessed/performed      Past Medical History:  Diagnosis Date  . Breast cancer, right breast ([place])   . Hypertension   . Radiation 10/30/14-12/17/14   right breast 50.4 gray    Past Surgical History:  Procedure Laterality Date  . BREAST BIOPSY  05/16/14  . bunion surgrery    . carpel tunnel    . CHOLECYSTECTOMY  2015  . PORTACATH PLACEMENT Left 06/18/2014   Procedure: INSERTION PORT-A-CATH;  Surgeon: Excell Seltzer, MD;  Location: [place];  Service: General;  Laterality: Left;    There were no vitals filed for this visit.      Subjective Assessment - 12/05/15 1801    Subjective Pt reported she arrived late to today's appointment because she overslept. Pt did not fall asleep until 4 am. Pt has not called to f/u on heer lymphemdema sleeve nor tried the sleep hygiene strategies. Pt is trying to build up her endurance with less naps during the afternoon.    Pertinent History Diagnosed with right breast cancer in February 2016; had lumpectomy June 18, 2014 with 4-5 lymph nodes removed (all negative).  Adjuvant chemotherapy (still on herceptin) and radiation, completed August 2016.  Had superficial blood clot in right leg that caused swelling, so was put on Lasix. ON tamoxifen.                      Adult Aquatic Therapy - 12/05/15 1802      Aquatic  Therapy Subjective   Subjective Pt had no complaints.        O: Pt entered/exited the pool via steps with single UE support on rail.  50 ft =1 lap   Exercises performed in 4'6" depth   4 laps walking forward  4 laps side stepping 30 sec jogging x 5 reps , 30 sec rest   30 reps step ups with dumbbells in hands under water.  Stretches 4 laps breast stroke  2 laps back stroke with cuing   5' relaxation on noodles                   PT Long Term Goals - 11/12/15 0939      PT LONG TERM GOAL #1   Title Pt will report being able to walk 5 blocks without pain in order to exercise.   Time 12   Period Weeks   Status Partially Met     PT LONG TERM GOAL #2   Title Pt will have increased functional ROM with RUE for wiping and cleaning back and bottom.    Time 12   Period Weeks   Status Achieved     PT LONG TERM GOAL #3   Title Pt will be able to lay on her R side without interruption of pain in order to increase sleep quality.  Time 12   Period Weeks   Status Achieved     PT LONG TERM GOAL #4   Title Pt will decrease her Fatigue Severity Scale from 70% to < 50% in order to perform her ADLs, walk and exercise. (6/2: 87%)    Time 12   Period Weeks   Status On-going     PT LONG TERM GOAL #5   Title Pt will increase her LEFS score from 73% to  80% in order to play sports, walk, and squat. (6/2: 93%)    Status Achieved     PT LONG TERM GOAL #6   Title Pt will demo no lumbopelvic instability with 5 reps of Level 2 and 3 exercsies of the dynamic stabilization exercises in order to minimize injuries risks with fitness training.   Time 12   Period Weeks   Status Partially Met     PT LONG TERM GOAL #7   Title Pt will be able to demo the full form of breast stroke without cues for 50 ft (2 laps) without stopping for rest in order to demo IND with endurance training.   Time 12   Period Weeks   Status New               Plan - 12/05/15 1802    Clinical  Impression Statement Pt progressed to back stroke technique without difficulty.  Pt was educated on the importance of following sleep hygiene strategies to prepare for return to work with less fatigue. Pt was educated on f/u compression sleeve and voiced understanding.     Rehab Potential Good   PT Frequency 2x / week   PT Duration 12 weeks   PT Treatment/Interventions ADLs/Self Care Home Management;Aquatic Therapy;Cryotherapy;Balance training;Neuromuscular re-education;Gait training;Therapeutic exercise;Therapeutic activities;Functional mobility training;Stair training;Patient/family education;Manual techniques;Taping;Energy conservation;Scar mobilization   Consulted and Agree with Plan of Care Patient      Patient will benefit from skilled therapeutic intervention in order to improve the following deficits and impairments:  Decreased range of motion, Decreased strength, Impaired UE functional use, Hypomobility, Abnormal gait, Decreased activity tolerance, Improper body mechanics, Pain, Postural dysfunction, Increased muscle spasms, Decreased safety awareness, Decreased coordination, Decreased endurance, Decreased scar mobility  Visit Diagnosis: Pain in right hip  Pain in right shoulder  Other abnormalities of gait and mobility  Unspecified lack of coordination     Problem List Patient Active Problem List   Diagnosis Date Noted  . Obesity (BMI 35.0-39.9 without comorbidity) ([place]) 03/20/2015  . Leg edema, right 10/03/2014  . Genetic testing 07/10/2014  . Breast cancer of lower-inner quadrant of right female breast ([place]) 05/29/2014    [name] ,PT, DPT, E-RYT  12/05/2015, 6:03 PM  [place] MAIN [facility] SERVICES [address] [place], [place], [address] Phone: [phone]   Fax:  [phone]  Name: Krystal Kelley MRN: [id number] Date of Birth: 1971/01/02

## 2015-12-09 ENCOUNTER — Ambulatory Visit: Payer: BLUE CROSS/BLUE SHIELD | Admitting: Physical Therapy

## 2015-12-09 DIAGNOSIS — M25551 Pain in right hip: Secondary | ICD-10-CM | POA: Diagnosis not present

## 2015-12-09 DIAGNOSIS — R2689 Other abnormalities of gait and mobility: Secondary | ICD-10-CM

## 2015-12-09 DIAGNOSIS — M25511 Pain in right shoulder: Secondary | ICD-10-CM

## 2015-12-09 DIAGNOSIS — R279 Unspecified lack of coordination: Secondary | ICD-10-CM

## 2015-12-09 NOTE — Therapy (Addendum)
[place] MAIN [facility] SERVICES [address] [place], [place], [address] Phone: [phone]   Fax:  [phone]  Physical Therapy Treatment  Patient Details  Name: Krystal Kelley MRN: [id number] Date of Birth: 01-25-1971 Referring Provider: Lindi Adie  Encounter Date: 12/09/2015      PT End of Session - 12/09/15 1137    Visit Number 21   Date for PT Re-Evaluation 01/20/16   PT Start Time 1130   PT Stop Time 1165   PT Time Calculation (min) 35 min   Activity Tolerance Patient tolerated treatment well;No increased pain   Behavior During Therapy WFL for tasks assessed/performed      Past Medical History:  Diagnosis Date  . Breast cancer, right breast ([place])   . Hypertension   . Radiation 10/30/14-12/17/14   right breast 50.4 gray    Past Surgical History:  Procedure Laterality Date  . BREAST BIOPSY  05/16/14  . bunion surgrery    . carpel tunnel    . CHOLECYSTECTOMY  2015  . PORTACATH PLACEMENT Left 06/18/2014   Procedure: INSERTION PORT-A-CATH;  Surgeon: Excell Seltzer, MD;  Location: [place];  Service: General;  Laterality: Left;    There were no vitals filed for this visit.      Subjective Assessment - 12/09/15 1133    Subjective Pt reported she started going to bed at 11pm. Pt states the last two weeks R cramping under armpit (similar to what she experienced at [facility]) and her back had spasms.  Pt has been busy with other activities and had not maintained HEP.     Pertinent History Diagnosed with right breast cancer in February 2016; had lumpectomy June 18, 2014 with 4-5 lymph nodes removed (all negative).  Adjuvant chemotherapy (still on herceptin) and radiation, completed August 2016.  Had superficial blood clot in right leg that caused swelling, so was put on Lasix. ON tamoxifen.                      Adult Aquatic Therapy - 12/09/15 1137      Aquatic Therapy Subjective   Subjective Pt had no  complaints.         O: Pt entered/exited the pool via steps with single UE support on rail.  50 ft =1 lap  Exercises performed in 4'6" depth    2 laps of walking 2 laps of side walking 2 laps jumping  30 sec jogging, x 6 reps with 30 sec rests Stretches 2 laps [place]  2 laps back stroke with arms only Gentle kicks with legs 30 sec but pt preferred to not do as much today because she did not want to trigger leg cramps  5' floating on noodles.               PT Long Term Goals - 11/12/15 0939      PT LONG TERM GOAL #1   Title Pt will report being able to walk 5 blocks without pain in order to exercise.   Time 12   Period Weeks   Status Partially Met     PT LONG TERM GOAL #2   Title Pt will have increased functional ROM with RUE for wiping and cleaning back and bottom.    Time 12   Period Weeks   Status Achieved     PT LONG TERM GOAL #3   Title Pt will be able to lay on her R side without interruption of pain  in order to increase sleep quality.    Time 12   Period Weeks   Status Achieved     PT LONG TERM GOAL #4   Title Pt will decrease her Fatigue Severity Scale from 70% to < 50% in order to perform her ADLs, walk and exercise. (6/2: 87%)    Time 12   Period Weeks   Status On-going     PT LONG TERM GOAL #5   Title Pt will increase her LEFS score from 73% to  80% in order to play sports, walk, and squat. (6/2: 93%)    Status Achieved     PT LONG TERM GOAL #6   Title Pt will demo no lumbopelvic instability with 5 reps of Level 2 and 3 exercsies of the dynamic stabilization exercises in order to minimize injuries risks with fitness training.   Time 12   Period Weeks   Status Partially Met     PT LONG TERM GOAL #7   Title Pt will be able to demo the full form of breast stroke without cues for 50 ft (2 laps) without stopping for rest in order to demo IND with endurance training.   Time 12   Period Weeks   Status New               Plan -  12/09/15 1138    Clinical Impression Statement Pt tolerated aquatic therapy with modified routine 2/2 report of B leg cramps overnight and morning. Pt has begun to report compliance with sleep hygiene strategies. Pt was educated again on maintaining earlier bedtime to minimize fatigue and maintain health and wellness. Pt was encouraged to maintain 10 min of walking daily for improved circulation and to f/u with her MD re: leg cramps. Pt was explained the signs and Sx of DVT and pt voiced understanding on when to seek immediate medical attn. PT called [facility] to f/ u on her compressions sleeve and helped to facilitate scheduling with OT re: sleeve. Pt voiced understanding on HEP.    Rehab Potential Good   PT Frequency 2x / week   PT Duration 12 weeks   PT Treatment/Interventions ADLs/Self Care Home Management;Aquatic Therapy;Cryotherapy;Balance training;Neuromuscular re-education;Gait training;Therapeutic exercise;Therapeutic activities;Functional mobility training;Stair training;Patient/family education;Manual techniques;Taping;Energy conservation;Scar mobilization   Consulted and Agree with Plan of Care Patient      Patient will benefit from skilled therapeutic intervention in order to improve the following deficits and impairments:  Decreased range of motion, Decreased strength, Impaired UE functional use, Hypomobility, Abnormal gait, Decreased activity tolerance, Improper body mechanics, Pain, Postural dysfunction, Increased muscle spasms, Decreased safety awareness, Decreased coordination, Decreased endurance, Decreased scar mobility  Visit Diagnosis: Pain in right hip  Pain in right shoulder  Other abnormalities of gait and mobility  Unspecified lack of coordination     Problem List Patient Active Problem List   Diagnosis Date Noted  . Obesity (BMI 35.0-39.9 without comorbidity) ([place]) 03/20/2015  . Leg edema, right 10/03/2014  . Genetic testing 07/10/2014  . Breast cancer of  lower-inner quadrant of right female breast ([place]) 05/29/2014    [name] ,PT, DPT, E-RYT  12/09/2015, 11:38 AM  [place] MAIN [facility] SERVICES [address] [place], [place], [address] Phone: [phone]   Fax:  [phone]  Name: Krystal Kelley MRN: [id number] Date of Birth: [birth year]

## 2015-12-10 ENCOUNTER — Other Ambulatory Visit: Payer: Self-pay | Admitting: Hematology and Oncology

## 2015-12-10 DIAGNOSIS — C50311 Malignant neoplasm of lower-inner quadrant of right female breast: Secondary | ICD-10-CM

## 2015-12-11 ENCOUNTER — Ambulatory Visit: Payer: BLUE CROSS/BLUE SHIELD | Admitting: Physical Therapy

## 2015-12-11 ENCOUNTER — Telehealth: Payer: Self-pay | Admitting: *Deleted

## 2015-12-11 DIAGNOSIS — R2689 Other abnormalities of gait and mobility: Secondary | ICD-10-CM

## 2015-12-11 DIAGNOSIS — M25551 Pain in right hip: Secondary | ICD-10-CM

## 2015-12-11 DIAGNOSIS — M25511 Pain in right shoulder: Secondary | ICD-10-CM

## 2015-12-11 DIAGNOSIS — R279 Unspecified lack of coordination: Secondary | ICD-10-CM

## 2015-12-11 NOTE — Telephone Encounter (Signed)
"  I'm having trouble the last few weeks with cramps, spasms and unable to sleep.  I'm awake every 30 to 45 minutes trying to rub my calves, thighs.  Yesterday I couldn't walk. My Mom tried to rub it out.  It occurs to my back, inner thighs, shins, feet, toes.  My hands cramp and under my jaw and chin.  I do water therapy and did not do therapy the last two weeks so my therapist thinks this may be why.  I take the HCTZ on average three times a week.  Take lasix daily due to superficial blood clot in leg.  I use [facility] in [place].

## 2015-12-11 NOTE — Therapy (Signed)
[place] MAIN [facility] SERVICES [address] [place], [place], [address] Phone: [phone]   Fax:  [phone]  Physical Therapy Treatment  Patient Details  Name: Krystal Kelley MRN: [id number] Date of Birth: 31-May-1970 Referring Provider: Lindi Adie  Encounter Date: 12/11/2015      PT End of Session - 12/11/15 1159    Visit Number 22   Date for PT Re-Evaluation 01/20/16   PT Start Time 1130   PT Stop Time 1740   PT Time Calculation (min) 35 min   Activity Tolerance Patient tolerated treatment well;No increased pain   Behavior During Therapy WFL for tasks assessed/performed      Past Medical History:  Diagnosis Date  . Breast cancer, right breast ([place])   . Hypertension   . Radiation 10/30/14-12/17/14   right breast 50.4 gray    Past Surgical History:  Procedure Laterality Date  . BREAST BIOPSY  05/16/14  . bunion surgrery    . carpel tunnel    . CHOLECYSTECTOMY  2015  . PORTACATH PLACEMENT Left 06/18/2014   Procedure: INSERTION PORT-A-CATH;  Surgeon: Excell Seltzer, MD;  Location: [place];  Service: General;  Laterality: Left;    There were no vitals filed for this visit.      Subjective Assessment - 12/11/15 1156    Subjective Pt reported her cramping in her feet and back started in the night after last session and also in the evening yesterday at church. Pt 's sleep continues to be interrupted by cramping. Pt called her RN to report her Sx and she is waiting back to hear about f/u.  Pt plans to pick up her compression sleeve.    Pertinent History Diagnosed with right breast cancer in February 2016; had lumpectomy June 18, 2014 with 4-5 lymph nodes removed (all negative).  Adjuvant chemotherapy (still on herceptin) and radiation, completed August 2016.  Had superficial blood clot in right leg that caused swelling, so was put on Lasix. ON tamoxifen.                      Adult Aquatic Therapy -  12/11/15 1158      Aquatic Therapy Subjective   Subjective Pt had no complaints.         O: Pt entered/exited the pool via ramp with UE support on rail.  50 ft =1 lap  Exercises performed in 3'6" depth    stretches by wall 2 laps walking forward 2 laps walking backward  10 reps of mini squats 2 laps of grapevine   Relaxation on noodle 5'                     PT Long Term Goals - 11/12/15 8144      PT LONG TERM GOAL #1   Title Pt will report being able to walk 5 blocks without pain in order to exercise.   Time 12   Period Weeks   Status Partially Met     PT LONG TERM GOAL #2   Title Pt will have increased functional ROM with RUE for wiping and cleaning back and bottom.    Time 12   Period Weeks   Status Achieved     PT LONG TERM GOAL #3   Title Pt will be able to lay on her R side without interruption of pain in order to increase sleep quality.    Time 12   Period Weeks   Status  Achieved     PT LONG TERM GOAL #4   Title Pt will decrease her Fatigue Severity Scale from 70% to < 50% in order to perform her ADLs, walk and exercise. (6/2: 87%)    Time 12   Period Weeks   Status On-going     PT LONG TERM GOAL #5   Title Pt will increase her LEFS score from 73% to  80% in order to play sports, walk, and squat. (6/2: 93%)    Status Achieved     PT LONG TERM GOAL #6   Title Pt will demo no lumbopelvic instability with 5 reps of Level 2 and 3 exercsies of the dynamic stabilization exercises in order to minimize injuries risks with fitness training.   Time 12   Period Weeks   Status Partially Met     PT LONG TERM GOAL #7   Title Pt will be able to demo the full form of breast stroke without cues for 50 ft (2 laps) without stopping for rest in order to demo IND with endurance training.   Time 12   Period Weeks   Status New               Plan - 12/11/15 1200    Clinical Impression Statement Pt has notified her RN re: the persistent cramping  in BLE and back that started 1.5-2 weeks. These sensations have been occurring for years prior to cancer treatments and at the start of PT. Pt's session today was downgraded to include stretching and walking without strenuous movements.   Pt felt a cramp coming on in R foot at the end of session but it dissolved with ankle pumps and toes stretches. Continue to monitor pt's conditions.     Rehab Potential Good   PT Frequency 2x / week   PT Duration 12 weeks   PT Treatment/Interventions ADLs/Self Care Home Management;Aquatic Therapy;Cryotherapy;Balance training;Neuromuscular re-education;Gait training;Therapeutic exercise;Therapeutic activities;Functional mobility training;Stair training;Patient/family education;Manual techniques;Taping;Energy conservation;Scar mobilization   Consulted and Agree with Plan of Care Patient      Patient will benefit from skilled therapeutic intervention in order to improve the following deficits and impairments:  Decreased range of motion, Decreased strength, Impaired UE functional use, Hypomobility, Abnormal gait, Decreased activity tolerance, Improper body mechanics, Pain, Postural dysfunction, Increased muscle spasms, Decreased safety awareness, Decreased coordination, Decreased endurance, Decreased scar mobility  Visit Diagnosis: Pain in right hip  Pain in right shoulder  Other abnormalities of gait and mobility  Unspecified lack of coordination     Problem List Patient Active Problem List   Diagnosis Date Noted  . Obesity (BMI 35.0-39.9 without comorbidity) ([place]) 03/20/2015  . Leg edema, right 10/03/2014  . Genetic testing 07/10/2014  . Breast cancer of lower-inner quadrant of right female breast ([place]) 05/29/2014    Krystal Kelley ,PT, DPT, E-RYT  12/11/2015, 12:00 PM  [place] MAIN [facility] SERVICES [address] [place], [place], [address] Phone: [phone]   Fax:  [phone]  Name: Krystal Kelley MRN: [id number] Date of Birth: 12/04/1970

## 2015-12-12 ENCOUNTER — Other Ambulatory Visit (HOSPITAL_BASED_OUTPATIENT_CLINIC_OR_DEPARTMENT_OTHER): Payer: BLUE CROSS/BLUE SHIELD

## 2015-12-12 ENCOUNTER — Other Ambulatory Visit: Payer: Self-pay

## 2015-12-12 DIAGNOSIS — C50311 Malignant neoplasm of lower-inner quadrant of right female breast: Secondary | ICD-10-CM | POA: Diagnosis not present

## 2015-12-12 LAB — COMPREHENSIVE METABOLIC PANEL
ALT: 26 U/L (ref 0–55)
AST: 27 U/L (ref 5–34)
BUN: 13.8 mg/dL (ref 7.0–26.0)
CO2: 30 mEq/L — ABNORMAL HIGH (ref 22–29)
Creatinine: 1.1 mg/dL (ref 0.6–1.1)
Glucose: 107 mg/dl (ref 70–140)
Sodium: 142 mEq/L (ref 136–145)
Total Bilirubin: 0.42 mg/dL (ref 0.20–1.20)
Total Protein: 7.6 g/dL (ref 6.4–8.3)

## 2015-12-12 LAB — CBC WITH DIFFERENTIAL/PLATELET
BASO%: 0.3 % (ref 0.0–2.0)
Basophils Absolute: 0 10e3/uL (ref 0.0–0.1)
EOS%: 1.1 % (ref 0.0–7.0)
Eosinophils Absolute: 0.1 10e3/uL (ref 0.0–0.5)
HCT: 39.4 % (ref 34.8–46.6)
HGB: 13.2 g/dL (ref 11.6–15.9)
LYMPH%: 35.7 % (ref 14.0–49.7)
MCH: 31.1 pg (ref 25.1–34.0)
MCHC: 33.5 g/dL (ref 31.5–36.0)
MCV: 92.9 fL (ref 79.5–101.0)
MONO#: 0.6 10e3/uL (ref 0.1–0.9)
MONO%: 8 % (ref 0.0–14.0)
NEUT#: 4 10*3/uL (ref 1.5–6.5)
NEUT%: 54.9 % (ref 38.4–76.8)
Platelets: 301 10e3/uL (ref 145–400)
RBC: 4.24 10*6/uL (ref 3.70–5.45)
RDW: 13.4 % (ref 11.2–14.5)
WBC: 7.2 10*3/uL (ref 3.9–10.3)
lymph#: 2.6 10e3/uL (ref 0.9–3.3)

## 2015-12-12 LAB — COMPREHENSIVE METABOLIC PANEL WITH GFR
Albumin: 3.4 g/dL — ABNORMAL LOW (ref 3.5–5.0)
Alkaline Phosphatase: 59 U/L (ref 40–150)
Anion Gap: 7 meq/L (ref 3–11)
Calcium: 9.6 mg/dL (ref 8.4–10.4)
Chloride: 105 meq/L (ref 98–109)
EGFR: 74 ml/min/1.73 m2 — ABNORMAL LOW (ref >90)
Potassium: 3.9 meq/L (ref 3.5–5.1)

## 2015-12-12 LAB — MAGNESIUM: Magnesium: 2.1 mg/dL (ref 1.5–2.5)

## 2015-12-15 NOTE — Telephone Encounter (Signed)
Per Dr. Lindi Adie, I let pt know to take the patient before she goes to bed (has been taking in am).  Also, pt to push fluids and drink tonic water before going to bed.    Try this for 1-2 weeks and if still a problem, call clinic.  Pt voiced understanding.

## 2015-12-16 ENCOUNTER — Ambulatory Visit: Payer: BLUE CROSS/BLUE SHIELD | Admitting: Physical Therapy

## 2015-12-18 ENCOUNTER — Ambulatory Visit: Payer: BLUE CROSS/BLUE SHIELD | Admitting: Physical Therapy

## 2015-12-23 ENCOUNTER — Ambulatory Visit: Payer: BLUE CROSS/BLUE SHIELD | Admitting: Physical Therapy

## 2015-12-24 ENCOUNTER — Ambulatory Visit: Payer: BLUE CROSS/BLUE SHIELD | Attending: Hematology and Oncology | Admitting: Physical Therapy

## 2015-12-24 DIAGNOSIS — I890 Lymphedema, not elsewhere classified: Secondary | ICD-10-CM | POA: Insufficient documentation

## 2015-12-24 DIAGNOSIS — R2689 Other abnormalities of gait and mobility: Secondary | ICD-10-CM | POA: Insufficient documentation

## 2015-12-24 DIAGNOSIS — M25511 Pain in right shoulder: Secondary | ICD-10-CM | POA: Diagnosis present

## 2015-12-24 DIAGNOSIS — M25551 Pain in right hip: Secondary | ICD-10-CM

## 2015-12-24 DIAGNOSIS — R279 Unspecified lack of coordination: Secondary | ICD-10-CM | POA: Diagnosis present

## 2015-12-24 NOTE — Patient Instructions (Addendum)
Be consistent with the following:  1. finish one bottle of 24 fl oz of water (bottle that is stainless steel and not plastic)    2 a. continue going to bed earlier.  2b. perform deep core level 2 ( 10 reps x 3 ) single knee out  before bed to help with relaxation and core stability  3. walking daily 20 min (and no more) (wait on going to the gym until PT discusses) ( when back on water therapy appts, skip Tues. Thurs)    ___________________  This week , pick up compression sleeve.

## 2015-12-24 NOTE — Therapy (Addendum)
[place] MAIN [facility] SERVICES [address] [place], [place], [address] Phone: [phone]   Fax:  [phone]  Physical Therapy Treatment  Patient Details  Name: Krystal Kelley MRN: [id number] Date of Birth: July 08, 1970 Referring Provider: Lindi Adie  Encounter Date: 12/24/2015      PT End of Session - 12/24/15 1042    Visit Number 23   Date for PT Re-Evaluation 01/20/16   PT Start Time 1030   PT Stop Time 1104   PT Time Calculation (min) 34 min   Activity Tolerance Patient tolerated treatment well;No increased pain   Behavior During Therapy WFL for tasks assessed/performed      Past Medical History:  Diagnosis Date  . Breast cancer, right breast ([place])   . Hypertension   . Radiation 10/30/14-12/17/14   right breast 50.4 gray    Past Surgical History:  Procedure Laterality Date  . BREAST BIOPSY  05/16/14  . bunion surgrery    . carpel tunnel    . CHOLECYSTECTOMY  2015  . PORTACATH PLACEMENT Left 06/18/2014   Procedure: INSERTION PORT-A-CATH;  Surgeon: Excell Seltzer, MD;  Location: [place];  Service: General;  Laterality: Left;    There were no vitals filed for this visit.      Subjective Assessment - 12/24/15 1027    Subjective Pt has been following up with her MD re: leg cramping and changes were made to her medications. Pt had her bloodwork done which showed negative findings. Pt has started to drink more water. Pt continue to feel tired. Pt walked for 1 hr and was tired afterwards. Pt has not been walking on a regular basis .  Pt has been getting to bed earlier and has been falling asleep "a little".  Pt will be picking up her compression sleeve this week.                          [place] Adult PT Treatment/Exercise - 12/24/15 1046      Therapeutic Activites    Other Therapeutic Activities see pt instructions, and faciliated on getting pt scheduled with OT   10 reps x 3,  deep core level 2                  PT Education - 12/24/15 1041    Education provided Yes   Education Details HEP   Person(s) Educated Patient   Methods Explanation;Demonstration;Tactile cues;Verbal cues;Handout   Comprehension Verbalized understanding;Returned demonstration             PT Long Term Goals - 11/12/15 0939      PT LONG TERM GOAL #1   Title Pt will report being able to walk 5 blocks without pain in order to exercise.   Time 12   Period Weeks   Status Partially Met     PT LONG TERM GOAL #2   Title Pt will have increased functional ROM with RUE for wiping and cleaning back and bottom.    Time 12   Period Weeks   Status Achieved     PT LONG TERM GOAL #3   Title Pt will be able to lay on her R side without interruption of pain in order to increase sleep quality.    Time 12   Period Weeks   Status Achieved     PT LONG TERM GOAL #4   Title Pt will decrease her Fatigue Severity Scale from 70% to <  50% in order to perform her ADLs, walk and exercise. (6/2: 87%)    Time 12   Period Weeks   Status On-going     PT LONG TERM GOAL #5   Title Pt will increase her LEFS score from 73% to  80% in order to play sports, walk, and squat. (6/2: 93%)    Status Achieved     PT LONG TERM GOAL #6   Title Pt will demo no lumbopelvic instability with 5 reps of Level 2 and 3 exercsies of the dynamic stabilization exercises in order to minimize injuries risks with fitness training.   Time 12   Period Weeks   Status Partially Met     PT LONG TERM GOAL #7   Title Pt will be able to demo the full form of breast stroke without cues for 50 ft (2 laps) without stopping for rest in order to demo IND with endurance training.   Time 12   Period Weeks   Status New               Plan - 12/24/15 1047    Clinical Impression Statement Pt's cramping complaints are being monitored by her MD. Pt remains more compliant with sleep hygiene but required coaching to be on principles of energy  conservaton, water intake, and daily aerobic activity to better manage fatigue. Session was abbreviated due to pt' s late arrival. PT facilitated in helping pt to get scheduled with her OT. Pt will continue to be benefit from skilled PT.     Rehab Potential Good   PT Frequency 2x / week   PT Duration 12 weeks   PT Treatment/Interventions ADLs/Self Care Home Management;Aquatic Therapy;Cryotherapy;Balance training;Neuromuscular re-education;Gait training;Therapeutic exercise;Therapeutic activities;Functional mobility training;Stair training;Patient/family education;Manual techniques;Taping;Energy conservation;Scar mobilization   Consulted and Agree with Plan of Care Patient      Patient will benefit from skilled therapeutic intervention in order to improve the following deficits and impairments:  Decreased range of motion, Decreased strength, Impaired UE functional use, Hypomobility, Abnormal gait, Decreased activity tolerance, Improper body mechanics, Pain, Postural dysfunction, Increased muscle spasms, Decreased safety awareness, Decreased coordination, Decreased endurance, Decreased scar mobility  Visit Diagnosis: Pain in right hip  Pain in right shoulder  Other abnormalities of gait and mobility  Unspecified lack of coordination     Problem List Patient Active Problem List   Diagnosis Date Noted  . Obesity (BMI 35.0-39.9 without comorbidity) ([place]) 03/20/2015  . Leg edema, right 10/03/2014  . Genetic testing 07/10/2014  . Breast cancer of lower-inner quadrant of right female breast ([place]) 05/29/2014    [name] ,PT, DPT, E-RYT  12/24/2015, 10:57 AM  [place] MAIN [facility] SERVICES [address] [place], [place], [address] Phone: [phone]   Fax:  [phone]  Name: Krystal Kelley MRN: [id number] Date of Birth: [birth year]

## 2015-12-25 ENCOUNTER — Ambulatory Visit: Payer: BLUE CROSS/BLUE SHIELD | Admitting: Physical Therapy

## 2015-12-25 DIAGNOSIS — R279 Unspecified lack of coordination: Secondary | ICD-10-CM

## 2015-12-25 DIAGNOSIS — M25551 Pain in right hip: Secondary | ICD-10-CM | POA: Diagnosis not present

## 2015-12-25 DIAGNOSIS — M25511 Pain in right shoulder: Secondary | ICD-10-CM

## 2015-12-25 DIAGNOSIS — R2689 Other abnormalities of gait and mobility: Secondary | ICD-10-CM

## 2015-12-26 NOTE — Therapy (Signed)
[place] MAIN [facility] SERVICES [address] [place], [place], [address] Phone: [phone]   Fax:  [phone]  Physical Therapy Treatment  Patient Details  Name: Krystal Kelley MRN: [id number] Date of Birth: November 26, 1970 Referring Provider: Lindi Adie  Encounter Date: 12/25/2015      PT End of Session - 12/26/15 2129    Visit Number 24   Date for PT Re-Evaluation 01/20/16   PT Start Time 1140   PT Stop Time 1200   PT Time Calculation (min) 20 min   Activity Tolerance Patient tolerated treatment well;No increased pain   Behavior During Therapy WFL for tasks assessed/performed      Past Medical History:  Diagnosis Date  . Breast cancer, right breast ([place])   . Hypertension   . Radiation 10/30/14-12/17/14   right breast 50.4 gray    Past Surgical History:  Procedure Laterality Date  . BREAST BIOPSY  05/16/14  . bunion surgrery    . carpel tunnel    . CHOLECYSTECTOMY  2015  . PORTACATH PLACEMENT Left 06/18/2014   Procedure: INSERTION PORT-A-CATH;  Surgeon: Excell Seltzer, MD;  Location: [place];  Service: General;  Laterality: Left;    There were no vitals filed for this visit.      Subjective Assessment - 12/26/15 2128    Subjective Pt stated she has had less cramping as she is following the recommendations her MD has given her on medication time intake. Pt will have two events to attend later today.                      Adult Aquatic Therapy - 12/26/15 2129      Aquatic Therapy Subjective   Subjective Pt had no complaints.           O: Pt entered/exited the pool via steps with single UE support on rail.  50 ft =1 lap  Exercises performed in 3'6" depth   2 laps grape vine L/R  2 laps walking back wards  2 laps walking forwards  Exercises performed in 4'6" depth     2 laps grape vine L/R  2 laps walking back wards  2 laps walking forwards     5' relaxation with  noodles             PT Long Term Goals - 11/12/15 0939      PT LONG TERM GOAL #1   Title Pt will report being able to walk 5 blocks without pain in order to exercise.   Time 12   Period Weeks   Status Partially Met     PT LONG TERM GOAL #2   Title Pt will have increased functional ROM with RUE for wiping and cleaning back and bottom.    Time 12   Period Weeks   Status Achieved     PT LONG TERM GOAL #3   Title Pt will be able to lay on her R side without interruption of pain in order to increase sleep quality.    Time 12   Period Weeks   Status Achieved     PT LONG TERM GOAL #4   Title Pt will decrease her Fatigue Severity Scale from 70% to < 50% in order to perform her ADLs, walk and exercise. (6/2: 87%)    Time 12   Period Weeks   Status On-going     PT LONG TERM GOAL #5   Title Pt will increase her  LEFS score from 73% to  80% in order to play sports, walk, and squat. (6/2: 93%)    Status Achieved     PT LONG TERM GOAL #6   Title Pt will demo no lumbopelvic instability with 5 reps of Level 2 and 3 exercsies of the dynamic stabilization exercises in order to minimize injuries risks with fitness training.   Time 12   Period Weeks   Status Partially Met     PT LONG TERM GOAL #7   Title Pt will be able to demo the full form of breast stroke without cues for 50 ft (2 laps) without stopping for rest in order to demo IND with endurance training.   Time 12   Period Weeks   Status New               Plan - 12/26/15 2130    Clinical Impression Statement Pt 's session was abbreviated for the following reasons:  1) pt's late arrival 2) educate pt on activity pacing to manage overfatigue because she has two additional events to attend later today 3) pt is returning to exercising after a 2 week hiatus with decreased physical activity level .  Pt agreed to not overdo her aquatic exercises and voiced understanding on energy conservation strategies. She agreed to  incorporate a 10 min rest break  later today between or after her activities.  Pt 's leg cramping has improved with MD management. Pt will continue to benefit from skilled PT to achieve her goals.        Patient will benefit from skilled therapeutic intervention in order to improve the following deficits and impairments:     Visit Diagnosis: Pain in right hip  Pain in right shoulder  Other abnormalities of gait and mobility  Unspecified lack of coordination     Problem List Patient Active Problem List   Diagnosis Date Noted  . Obesity (BMI 35.0-39.9 without comorbidity) ([place]) 03/20/2015  . Leg edema, right 10/03/2014  . Genetic testing 07/10/2014  . Breast cancer of lower-inner quadrant of right female breast ([place]) 05/29/2014    [name] ,PT, DPT, E-RYT  12/26/2015, 9:34 PM  [place] MAIN [facility] SERVICES [address] [place], [place], [address] Phone: [phone]   Fax:  [phone]  Name: Krystal Kelley MRN: [id number] Date of Birth: Jul 03, 1970

## 2015-12-29 ENCOUNTER — Ambulatory Visit: Payer: BLUE CROSS/BLUE SHIELD | Admitting: Physical Therapy

## 2015-12-30 ENCOUNTER — Ambulatory Visit: Payer: BLUE CROSS/BLUE SHIELD | Admitting: Physical Therapy

## 2016-01-01 ENCOUNTER — Telehealth: Payer: Self-pay | Admitting: *Deleted

## 2016-01-01 ENCOUNTER — Ambulatory Visit: Payer: BLUE CROSS/BLUE SHIELD | Admitting: Physical Therapy

## 2016-01-01 DIAGNOSIS — M25551 Pain in right hip: Secondary | ICD-10-CM | POA: Diagnosis not present

## 2016-01-01 DIAGNOSIS — R2689 Other abnormalities of gait and mobility: Secondary | ICD-10-CM

## 2016-01-01 DIAGNOSIS — R279 Unspecified lack of coordination: Secondary | ICD-10-CM

## 2016-01-01 DIAGNOSIS — M25511 Pain in right shoulder: Secondary | ICD-10-CM

## 2016-01-01 NOTE — Telephone Encounter (Signed)
"  This is Ronny Bacon with [facility] calling to F/U on the treatment plan for this patient.  Is she receiving chemotherapy?"  Advised she completed Herceptin March 2017 and started Tamoxifen August 2016 which will be at least a five year treatment planned.  No further questions.

## 2016-01-02 ENCOUNTER — Ambulatory Visit: Payer: BLUE CROSS/BLUE SHIELD | Admitting: Occupational Therapy

## 2016-01-02 NOTE — Therapy (Addendum)
[place] MAIN [facility] SERVICES [address] [place], [place], [address] Phone: [phone]   Fax:  [phone]  Physical Therapy Treatment  Patient Details  Name: Krystal Kelley MRN: [id number] Date of Birth: May 06, 1970 Referring Provider: Lindi Adie  Encounter Date: 01/01/2016      PT End of Session - 01/02/16 2013    Visit Number 25   Date for PT Re-Evaluation 01/20/16   PT Start Time 1020   PT Stop Time 1055   PT Time Calculation (min) 35 min   Activity Tolerance Patient tolerated treatment well;No increased pain   Behavior During Therapy WFL for tasks assessed/performed      Past Medical History:  Diagnosis Date  . Breast cancer, right breast ([place])   . Hypertension   . Radiation 10/30/14-12/17/14   right breast 50.4 gray    Past Surgical History:  Procedure Laterality Date  . BREAST BIOPSY  05/16/14  . bunion surgrery    . carpel tunnel    . CHOLECYSTECTOMY  2015  . PORTACATH PLACEMENT Left 06/18/2014   Procedure: INSERTION PORT-A-CATH;  Surgeon: Excell Seltzer, MD;  Location: [place];  Service: General;  Laterality: Left;    There were no vitals filed for this visit.      Subjective Assessment - 01/02/16 2012    Subjective Pt                     Adult Aquatic Therapy - 01/02/16 2012      Aquatic Therapy Subjective   Subjective Pt had no complaints.        O: Pt entered/exited the pool via steps with single UE support on rail.  50 ft =1 lap  Exercises performed in 3'6" depth   2 laps grape vine L/R  2 laps walking back wards  2 laps walking forwards  Exercises performed in 4'6" depth     HIIT    30 sec x 5 sets with 30 sec rest   Jogging (3 sets)  Tricep dips with noodle under water sidejogging   2 laps with full breast stroke , head above water    5' relaxation with noodles                  PT Long Term Goals - 01/02/16 2013      PT LONG TERM  GOAL #1   Title Pt will report being able to walk 5 blocks without pain in order to exercise.   Time 12   Period Weeks   Status Partially Met     PT LONG TERM GOAL #2   Title Pt will have increased functional ROM with RUE for wiping and cleaning back and bottom.    Time 12   Period Weeks   Status Achieved     PT LONG TERM GOAL #3   Title Pt will be able to lay on her R side without interruption of pain in order to increase sleep quality.    Time 12   Period Weeks   Status Achieved     PT LONG TERM GOAL #4   Title Pt will decrease her Fatigue Severity Scale from 70% to < 50% in order to perform her ADLs, walk and exercise. (6/2: 87%)    Time 12   Period Weeks   Status On-going     PT LONG TERM GOAL #5   Title Pt will increase her LEFS score from 73% to  80% in  order to play sports, walk, and squat. (6/2: 93%)    Status Achieved     PT LONG TERM GOAL #6   Title Pt will demo no lumbopelvic instability with 5 reps of Level 2 and 3 exercsies of the dynamic stabilization exercises in order to minimize injuries risks with fitness training.   Time 12   Period Weeks   Status Partially Met     PT LONG TERM GOAL #7   Title Pt will be able to demo the full form of breast stroke without cues for 50 ft (2 laps) without stopping for rest in order to demo IND with endurance training.   Time 12   Period Weeks   Status New               Plan - 01/02/16 2013    Clinical Impression Statement Pt was able to increase her lap swimming endurance. Pt continued to require encouragement to f/u with getting her compression sleeve and appt with OT to learn preventation of  UE lymphemdema. Pt voiced her sleep hygiene and activity pacing recommendations to manage fatigue. Pt no longer has leg cramps as she remains compliant with her MD's advise on medication changes and  intake schedule. Pt is advancing towards her goals.    Rehab Potential Good   Clinical Impairments Affecting Rehab Potential  none    PT Frequency 2x / week   PT Duration 12 weeks   PT Treatment/Interventions ADLs/Self Care Home Management;Aquatic Therapy;Cryotherapy;Balance training;Neuromuscular re-education;Gait training;Therapeutic exercise;Therapeutic activities;Functional mobility training;Stair training;Patient/family education;Manual techniques;Taping;Energy conservation;Scar mobilization   Consulted and Agree with Plan of Care Patient      Patient will benefit from skilled therapeutic intervention in order to improve the following deficits and impairments:  Decreased range of motion, Decreased strength, Impaired UE functional use, Hypomobility, Abnormal gait, Decreased activity tolerance, Improper body mechanics, Pain, Postural dysfunction, Increased muscle spasms, Decreased safety awareness, Decreased coordination, Decreased endurance, Decreased scar mobility  Visit Diagnosis: Pain in right hip  Pain in right shoulder  Other abnormalities of gait and mobility  Unspecified lack of coordination     Problem List Patient Active Problem List   Diagnosis Date Noted  . Obesity (BMI 35.0-39.9 without comorbidity) ([place]) 03/20/2015  . Leg edema, right 10/03/2014  . Genetic testing 07/10/2014  . Breast cancer of lower-inner quadrant of right female breast ([place]) 05/29/2014    [name] ,PT, DPT, E-RYT  01/02/2016, 8:14 PM  [place] MAIN [facility] SERVICES [address] [place], [place], [address] Phone: [phone]   Fax:  [phone]  Name: Krystal Kelley MRN: [id number] Date of Birth: 1971-03-21

## 2016-01-05 ENCOUNTER — Other Ambulatory Visit: Payer: Self-pay | Admitting: Hematology and Oncology

## 2016-01-05 ENCOUNTER — Telehealth: Payer: Self-pay | Admitting: Hematology and Oncology

## 2016-01-05 DIAGNOSIS — C50311 Malignant neoplasm of lower-inner quadrant of right female breast: Secondary | ICD-10-CM

## 2016-01-05 NOTE — Telephone Encounter (Signed)
[employer] disability rep Andi Devon) contacted office to confirm fax number and verify recent visit to request records.

## 2016-01-06 ENCOUNTER — Ambulatory Visit: Payer: BLUE CROSS/BLUE SHIELD | Admitting: Physical Therapy

## 2016-01-06 DIAGNOSIS — M25511 Pain in right shoulder: Secondary | ICD-10-CM

## 2016-01-06 DIAGNOSIS — R279 Unspecified lack of coordination: Secondary | ICD-10-CM

## 2016-01-06 DIAGNOSIS — M25551 Pain in right hip: Secondary | ICD-10-CM

## 2016-01-06 DIAGNOSIS — R2689 Other abnormalities of gait and mobility: Secondary | ICD-10-CM

## 2016-01-06 NOTE — Therapy (Signed)
[place] MAIN [facility] SERVICES [address] [place], [place], [address] Phone: [phone]   Fax:  [phone]  Physical Therapy Treatment  Patient Details  Name: Krystal Kelley MRN: [id number] Date of Birth: 1970-12-12 Referring Provider: Lindi Adie  Encounter Date: 01/06/2016      PT End of Session - 01/06/16 1138    Visit Number 26   Date for PT Re-Evaluation 01/20/16   PT Start Time 4174   PT Stop Time 1120   PT Time Calculation (min) 35 min   Activity Tolerance Patient tolerated treatment well;No increased pain   Behavior During Therapy WFL for tasks assessed/performed      Past Medical History:  Diagnosis Date  . Breast cancer, right breast ([place])   . Hypertension   . Radiation 10/30/14-12/17/14   right breast 50.4 gray    Past Surgical History:  Procedure Laterality Date  . BREAST BIOPSY  05/16/14  . bunion surgrery    . carpel tunnel    . CHOLECYSTECTOMY  2015  . PORTACATH PLACEMENT Left 06/18/2014   Procedure: INSERTION PORT-A-CATH;  Surgeon: Excell Seltzer, MD;  Location: [place];  Service: General;  Laterality: Left;    There were no vitals filed for this visit.      Subjective Assessment - 01/06/16 1136    Subjective Pt reported she went to bed at 1am last night which was the earlier than usual beccause she was tired. Pt continues to try and take rest breaks between activities but she still feels tired.    Pertinent History Diagnosed with right breast cancer in February 2016; had lumpectomy June 18, 2014 with 4-5 lymph nodes removed (all negative).  Adjuvant chemotherapy (still on herceptin) and radiation, completed August 2016.  Had superficial blood clot in right leg that caused swelling, so was put on Lasix. ON tamoxifen.                      Adult Aquatic Therapy - 01/06/16 1138      Aquatic Therapy Subjective   Subjective Pt had no complaints.       O: Pt entered/exited the  pool via steps with single UE support on rail.  50 ft =1 lap  Exercises performed in 3'6" depth   4 laps walking forward  4 laps walking backward     Exercises performed in 4'6" depth    4 laps hopping 4 laps sidestepping  3 sets of 2 min jogging with 1 min rest  5 sets of 1 min treading water  39mn relaxation on noodles   PT recommended pt to set a timer at midnight for pt to start winding down to ensure a regular sleep schedule at 1am as pt states 1am is the earlier she has been going to sleep.  Pt voiced understanding.                 PT Long Term Goals - 01/02/16 2013      PT LONG TERM GOAL #1   Title Pt will report being able to walk 5 blocks without pain in order to exercise.   Time 12   Period Weeks   Status Partially Met     PT LONG TERM GOAL #2   Title Pt will have increased functional ROM with RUE for wiping and cleaning back and bottom.    Time 12   Period Weeks   Status Achieved     PT LONG TERM GOAL #  3   Title Pt will be able to lay on her R side without interruption of pain in order to increase sleep quality.    Time 12   Period Weeks   Status Achieved     PT LONG TERM GOAL #4   Title Pt will decrease her Fatigue Severity Scale from 70% to < 50% in order to perform her ADLs, walk and exercise. (6/2: 87%)    Time 12   Period Weeks   Status On-going     PT LONG TERM GOAL #5   Title Pt will increase her LEFS score from 73% to  80% in order to play sports, walk, and squat. (6/2: 93%)    Status Achieved     PT LONG TERM GOAL #6   Title Pt will demo no lumbopelvic instability with 5 reps of Level 2 and 3 exercsies of the dynamic stabilization exercises in order to minimize injuries risks with fitness training.   Time 12   Period Weeks   Status Partially Met     PT LONG TERM GOAL #7   Title Pt will be able to demo the full form of breast stroke without cues for 50 ft (2 laps) without stopping for rest in order to demo IND with  endurance training.   Time 12   Period Weeks   Status New               Plan - 01/06/16 1138    Clinical Impression Statement Pt continues to require guidance on energy conservation and improvement of sleep hygiene to better manage her fatigue. Pt demo'd increased endurance and increased reps with HIIT with pool exercises but was guided to maintain rest breaks and relaxation in a 35 min sessions. Pt picked up her compressive sleeve and has an appt with OT to learn lymphedema.  Pt is advancing towards her goals.    Rehab Potential Good   Clinical Impairments Affecting Rehab Potential none    PT Frequency 2x / week   PT Duration 12 weeks   PT Treatment/Interventions ADLs/Self Care Home Management;Aquatic Therapy;Cryotherapy;Balance training;Neuromuscular re-education;Gait training;Therapeutic exercise;Therapeutic activities;Functional mobility training;Stair training;Patient/family education;Manual techniques;Taping;Energy conservation;Scar mobilization   Consulted and Agree with Plan of Care Patient      Patient will benefit from skilled therapeutic intervention in order to improve the following deficits and impairments:  Decreased range of motion, Decreased strength, Impaired UE functional use, Hypomobility, Abnormal gait, Decreased activity tolerance, Improper body mechanics, Pain, Postural dysfunction, Increased muscle spasms, Decreased safety awareness, Decreased coordination, Decreased endurance, Decreased scar mobility  Visit Diagnosis: Pain in right hip  Pain in right shoulder  Other abnormalities of gait and mobility  Unspecified lack of coordination     Problem List Patient Active Problem List   Diagnosis Date Noted  . Obesity (BMI 35.0-39.9 without comorbidity) ([place]) 03/20/2015  . Leg edema, right 10/03/2014  . Genetic testing 07/10/2014  . Breast cancer of lower-inner quadrant of right female breast ([place]) 05/29/2014    [name] ,PT, DPT,  E-RYT  01/06/2016, 11:43 AM  [place] MAIN [facility] SERVICES [address] [place], [place], [address] Phone: [phone]   Fax:  [phone]  Name: Krystal Kelley MRN: [id number] Date of Birth: 01-25-1971

## 2016-01-07 ENCOUNTER — Ambulatory Visit (INDEPENDENT_AMBULATORY_CARE_PROVIDER_SITE_OTHER): Payer: BLUE CROSS/BLUE SHIELD | Admitting: Podiatry

## 2016-01-07 ENCOUNTER — Ambulatory Visit: Payer: BLUE CROSS/BLUE SHIELD | Admitting: Occupational Therapy

## 2016-01-07 ENCOUNTER — Encounter: Payer: Self-pay | Admitting: Podiatry

## 2016-01-07 DIAGNOSIS — I890 Lymphedema, not elsewhere classified: Secondary | ICD-10-CM

## 2016-01-07 DIAGNOSIS — M722 Plantar fascial fibromatosis: Secondary | ICD-10-CM

## 2016-01-07 DIAGNOSIS — M25551 Pain in right hip: Secondary | ICD-10-CM | POA: Diagnosis not present

## 2016-01-07 NOTE — Therapy (Signed)
[place] [facility] [address], [place], [address] Phone: [phone]   Fax:  [phone]  Occupational Therapy Treatment  Patient Details  Name: Krystal Kelley MRN: [id number] Date of Birth: [birth year] Referring Provider: Lindi Adie  Encounter Date: 01/07/2016      OT End of Session - 01/07/16 1443    Visit Number 3   Number of Visits 5   Date for OT Re-Evaluation 02/04/16   OT Start Time 1312   OT Stop Time 1344   OT Time Calculation (min) 32 min   Activity Tolerance Patient tolerated treatment well   Behavior During Therapy [facility] for tasks assessed/performed      Past Medical History:  Diagnosis Date  . Breast cancer, right breast ([place])   . Hypertension   . Radiation 10/30/14-12/17/14   right breast 50.4 gray    Past Surgical History:  Procedure Laterality Date  . BREAST BIOPSY  05/16/14  . bunion surgrery    . carpel tunnel    . CHOLECYSTECTOMY  2015  . PORTACATH PLACEMENT Left 06/18/2014   Procedure: INSERTION PORT-A-CATH;  Surgeon: Excell Seltzer, MD;  Location: [place];  Service: General;  Laterality: Left;    There were no vitals filed for this visit.      Subjective Assessment - 01/07/16 1316    Subjective  I still get tired - my R arm and hip doing much better - sleeve pick up las week -they had to remeasured  me- still no having chest pad - R breast still feel heavier than L , and tight    Patient Stated Goals I do not want lymphedema and wear garments for the rest of my life - want my R shoulder better   Currently in Pain? No/denies             LYMPHEDEMA/ONCOLOGY QUESTIONNAIRE - 01/07/16 1328      Right Upper Extremity Lymphedema   10 cm Proximal to Olecranon Process 41.3 cm   Olecranon Process 29.5 cm   10 cm Proximal to Ulnar Styloid Process 26.3 cm   Just Proximal to Ulnar Styloid Process 19.2 cm   At Base of 2nd Digit 6.9 cm     Left Upper Extremity  Lymphedema   10 cm Proximal to Olecranon Process 40.8 cm   Olecranon Process 30 cm   10 cm Proximal to Ulnar Styloid Process 27.3 cm   Just Proximal to Ulnar Styloid Process 20 cm   At Base of 2nd Digit 7 cm      Measured  bilateral UE and compare to last 2 times- pt ed on wearing of compression and assess Medi Harmony garment for R UE  - to short and loose  - pt ed on correct length - and send back to get remeasure and to contact me when new sleeve is fitted to assess fit/.  Contacted [facility] about correct garment       Adult Aquatic Therapy - 01/06/16 1138      Aquatic Therapy Subjective   Subjective Pt had no complaints.                    OT Education - 01/07/16 1442    Education provided Yes   Education Details Wearing of compression garments   Person(s) Educated Patient   Methods Explanation;Demonstration   Comprehension Verbalized understanding;Returned demonstration          OT Short Term Goals - 01/07/16 1448  OT SHORT TERM GOAL #1   Title Pt R UE elbow to upper arm decrease by 1cm by wearing compression sleeves and MLD    Baseline UE compare R to L - decreased - breast still same- not wearing garments yet   Time 3   Period Weeks   Status On-going           OT Long Term Goals - 01/07/16 1448      OT LONG TERM GOAL #1   Title Pt to be ind in wearing of compression garments and  doing self MLD    Baseline had to be send back to get re measured   Time 4   Period Weeks   Status On-going               Plan - 01/07/16 1444    Clinical Impression Statement Pt measurements come down again in bilateral UE compare to last time - and R UE decrease compare to L - except proximal upper arm - pt is on diaretics - can explain maybe bilateral decrease - assess pt Medi Harmony sleeve but was to short - contacted  [facility] and pt to go by there to get remeasure and fitted - and return to me when she gets her sleeve - per pt she is still waiing  for her jovipak breast pad -  she cont to have  heavy and tight feeling in R breast    Rehab Potential Good   OT Frequency 1x / week   OT Duration 4 weeks   OT Treatment/Interventions Self-care/ADL training;Manual lymph drainage;Patient/family education;Therapeutic exercises   Plan Pt to come by for assessing her compression sleeve when she gets fitted by [place] See pt instruction   Consulted and Agree with Plan of Care Patient      Patient will benefit from skilled therapeutic intervention in order to improve the following deficits and impairments:  Impaired UE functional use, Increased edema  Visit Diagnosis: Lymphedema, not elsewhere classified - Plan: Ot plan of care cert/re-cert    Problem List Patient Active Problem List   Diagnosis Date Noted  . Obesity (BMI 35.0-39.9 without comorbidity) ([place]) 03/20/2015  . Leg edema, right 10/03/2014  . Genetic testing 07/10/2014  . Breast cancer of lower-inner quadrant of right female breast ([place]) 05/29/2014    Krystal Kelley OTR/L,CLT 01/07/2016, 2:51 PM  [place] [facility] [address] [place], [address] Phone: [phone]   Fax:  [phone]  Name: Krystal Kelley MRN: [id number] Date of Birth: 17-Jul-1970

## 2016-01-07 NOTE — Progress Notes (Signed)
She presents today chief complaint of pain primarily to the left heel. States the right heel is doing much better.  Objective: Vital signs are stable she is alert and oriented 3 pulses are palpable. She has no swelling about the bilateral lower extremities. She has pain on direct palpation medial trochanter both the left hip. No calf pain.  Assessment: Plantar fascitis left foot resolving plantar fasciitis right foot.  Plan: I injected the left heel today with longer local anesthetic follow-up with her in 6 weeks.

## 2016-01-07 NOTE — Patient Instructions (Signed)
Pt to go back to [facility] and get re measured and fitted with correct sleeve  And come back and have me assess if she get fitted

## 2016-01-08 ENCOUNTER — Ambulatory Visit: Payer: BLUE CROSS/BLUE SHIELD | Admitting: Physical Therapy

## 2016-01-08 NOTE — Telephone Encounter (Signed)
Chart reviewed.

## 2016-01-13 ENCOUNTER — Ambulatory Visit: Payer: BLUE CROSS/BLUE SHIELD | Admitting: Physical Therapy

## 2016-01-13 DIAGNOSIS — M25511 Pain in right shoulder: Secondary | ICD-10-CM

## 2016-01-13 DIAGNOSIS — R2689 Other abnormalities of gait and mobility: Secondary | ICD-10-CM

## 2016-01-13 DIAGNOSIS — M25551 Pain in right hip: Secondary | ICD-10-CM | POA: Diagnosis not present

## 2016-01-13 DIAGNOSIS — I890 Lymphedema, not elsewhere classified: Secondary | ICD-10-CM

## 2016-01-13 DIAGNOSIS — R279 Unspecified lack of coordination: Secondary | ICD-10-CM

## 2016-01-14 NOTE — Therapy (Signed)
[place] MAIN [facility] SERVICES [address] [place], [place], [address] Phone: [phone]   Fax:  [phone]  Physical Therapy Treatment  Patient Details  Name: Krystal Kelley MRN: [id number] Date of Birth: [birth year] Referring Provider: Lindi Adie  Encounter Date: 01/13/2016      PT End of Session - 01/14/16 2159    Visit Number 27   Date for PT Re-Evaluation 01/20/16   PT Start Time 1030   PT Stop Time 1115   PT Time Calculation (min) 45 min   Activity Tolerance Patient tolerated treatment well;No increased pain   Behavior During Therapy WFL for tasks assessed/performed      Past Medical History:  Diagnosis Date  . Breast cancer, right breast ([place])   . Hypertension   . Radiation 10/30/14-12/17/14   right breast 50.4 gray    Past Surgical History:  Procedure Laterality Date  . BREAST BIOPSY  05/16/14  . bunion surgrery    . carpel tunnel    . CHOLECYSTECTOMY  2015  . PORTACATH PLACEMENT Left 06/18/2014   Procedure: INSERTION PORT-A-CATH;  Surgeon: Excell Seltzer, MD;  Location: [place];  Service: General;  Laterality: Left;    There were no vitals filed for this visit.      Subjective Assessment - 01/14/16 2155    Subjective Pt reported she felt stressed this morning and has been doing alot lately. She feels tired and knows that exercise will be a way for her to relieve stress                     Adult Aquatic Therapy - 01/14/16 2159      Aquatic Therapy Subjective   Subjective Pt had no complaints.         O: Pt entered/exited the pool via steps with single UE support on rail.  50 ft =1 lap   Exercises performed in 4'6" depth   2 laps sideways walking  4 laps breast stroking without head under water 2 laps treading water   stretches  5 sets of treading water 30 sec   Exercises performed in 3'6" depth   floating on noodles on back, PT pulled pt with guided relaxation 4  laps              PT Long Term Goals - 01/02/16 2013      PT LONG TERM GOAL #1   Title Pt will report being able to walk 5 blocks without pain in order to exercise.   Time 12   Period Weeks   Status Partially Met     PT LONG TERM GOAL #2   Title Pt will have increased functional ROM with RUE for wiping and cleaning back and bottom.    Time 12   Period Weeks   Status Achieved     PT LONG TERM GOAL #3   Title Pt will be able to lay on her R side without interruption of pain in order to increase sleep quality.    Time 12   Period Weeks   Status Achieved     PT LONG TERM GOAL #4   Title Pt will decrease her Fatigue Severity Scale from 70% to < 50% in order to perform her ADLs, walk and exercise. (6/2: 87%)    Time 12   Period Weeks   Status On-going     PT LONG TERM GOAL #5   Title Pt will increase her LEFS score from  73% to  80% in order to play sports, walk, and squat. (6/2: 93%)    Status Achieved     PT LONG TERM GOAL #6   Title Pt will demo no lumbopelvic instability with 5 reps of Level 2 and 3 exercsies of the dynamic stabilization exercises in order to minimize injuries risks with fitness training.   Time 12   Period Weeks   Status Partially Met     PT LONG TERM GOAL #7   Title Pt will be able to demo the full form of breast stroke without cues for 50 ft (2 laps) without stopping for rest in order to demo IND with endurance training.   Time 12   Period Weeks   Status New               Plan - 01/14/16 2201    Clinical Impression Statement Pt was educated on working prioritizing a 15 min walk daily for stress relief and to continue with sleep hygiene goals to manage fatigue. Pt demo'd longer endurance with treading water today. Pt is close to achieving her goals.      Patient will benefit from skilled therapeutic intervention in order to improve the following deficits and impairments:     Visit Diagnosis: Lymphedema, not elsewhere  classified  Pain in right hip  Pain in right shoulder  Other abnormalities of gait and mobility  Unspecified lack of coordination     Problem List Patient Active Problem List   Diagnosis Date Noted  . Obesity (BMI 35.0-39.9 without comorbidity) ([place]) 03/20/2015  . Leg edema, right 10/03/2014  . Genetic testing 07/10/2014  . Breast cancer of lower-inner quadrant of right female breast ([place]) 05/29/2014    [name] 01/14/2016, 10:03 PM  [place] MAIN [facility] SERVICES [address] [place], [place], [address] Phone: [phone]   Fax:  [phone]  Name: SHANYCE DARIS MRN: [id number] Date of Birth: 1971/01/09

## 2016-01-15 ENCOUNTER — Ambulatory Visit: Payer: BLUE CROSS/BLUE SHIELD | Admitting: Physical Therapy

## 2016-01-15 DIAGNOSIS — M25551 Pain in right hip: Secondary | ICD-10-CM

## 2016-01-15 DIAGNOSIS — R279 Unspecified lack of coordination: Secondary | ICD-10-CM

## 2016-01-15 DIAGNOSIS — R2689 Other abnormalities of gait and mobility: Secondary | ICD-10-CM

## 2016-01-15 DIAGNOSIS — M25511 Pain in right shoulder: Secondary | ICD-10-CM

## 2016-01-16 NOTE — Therapy (Signed)
[place] MAIN [facility] SERVICES [address] [place], [place], [address] Phone: [phone]   Fax:  [phone]  Physical Therapy Treatment  Patient Details  Name: Krystal Kelley MRN: [id number] Date of Birth: [birth year] Referring Provider: Lindi Adie  Encounter Date: 01/15/2016      PT End of Session - 01/16/16 2212    Visit Number 28   Date for PT Re-Evaluation 01/20/16   PT Start Time 1025   PT Stop Time 1115   PT Time Calculation (min) 50 min   Activity Tolerance Patient tolerated treatment well;No increased pain   Behavior During Therapy WFL for tasks assessed/performed      Past Medical History:  Diagnosis Date  . Breast cancer, right breast ([place])   . Hypertension   . Radiation 10/30/14-12/17/14   right breast 50.4 gray    Past Surgical History:  Procedure Laterality Date  . BREAST BIOPSY  05/16/14  . bunion surgrery    . carpel tunnel    . CHOLECYSTECTOMY  2015  . PORTACATH PLACEMENT Left 06/18/2014   Procedure: INSERTION PORT-A-CATH;  Surgeon: Excell Seltzer, MD;  Location: [place];  Service: General;  Laterality: Left;    There were no vitals filed for this visit.      Subjective Assessment - 01/16/16 2211    Subjective Pt reported she plans to switch to a gym for women and has afternoon time for working before picking up her dtr.                      Adult Aquatic Therapy - 01/16/16 2212      Aquatic Therapy Subjective   Subjective Pt had no complaints.                          PT Long Term Goals - 01/02/16 2013      PT LONG TERM GOAL #1   Title Pt will report being able to walk 5 blocks without pain in order to exercise.   Time 12   Period Weeks   Status Partially Met     PT LONG TERM GOAL #2   Title Pt will have increased functional ROM with RUE for wiping and cleaning back and bottom.    Time 12   Period Weeks   Status Achieved     PT LONG TERM GOAL  #3   Title Pt will be able to lay on her R side without interruption of pain in order to increase sleep quality.    Time 12   Period Weeks   Status Achieved     PT LONG TERM GOAL #4   Title Pt will decrease her Fatigue Severity Scale from 70% to < 50% in order to perform her ADLs, walk and exercise. (6/2: 87%)    Time 12   Period Weeks   Status On-going     PT LONG TERM GOAL #5   Title Pt will increase her LEFS score from 73% to  80% in order to play sports, walk, and squat. (6/2: 93%)    Status Achieved     PT LONG TERM GOAL #6   Title Pt will demo no lumbopelvic instability with 5 reps of Level 2 and 3 exercsies of the dynamic stabilization exercises in order to minimize injuries risks with fitness training.   Time 12   Period Weeks   Status Partially Met     PT LONG  TERM GOAL #7   Title Pt will be able to demo the full form of breast stroke without cues for 50 ft (2 laps) without stopping for rest in order to demo IND with endurance training.   Time 12   Period Weeks   Status New               Plan - 01/16/16 2213    Clinical Impression Statement Pt   Rehab Potential Good   Clinical Impairments Affecting Rehab Potential none    PT Frequency 2x / week   PT Duration 12 weeks   PT Treatment/Interventions ADLs/Self Care Home Management;Aquatic Therapy;Cryotherapy;Balance training;Neuromuscular re-education;Gait training;Therapeutic exercise;Therapeutic activities;Functional mobility training;Stair training;Patient/family education;Manual techniques;Taping;Energy conservation;Scar mobilization   Consulted and Agree with Plan of Care Patient      Patient will benefit from skilled therapeutic intervention in order to improve the following deficits and impairments:  Decreased range of motion, Decreased strength, Impaired UE functional use, Hypomobility, Abnormal gait, Decreased activity tolerance, Improper body mechanics, Pain, Postural dysfunction, Increased muscle spasms,  Decreased safety awareness, Decreased coordination, Decreased endurance, Decreased scar mobility  Visit Diagnosis: Pain in right hip  Pain in right shoulder  Other abnormalities of gait and mobility  Unspecified lack of coordination     Problem List Patient Active Problem List   Diagnosis Date Noted  . Obesity (BMI 35.0-39.9 without comorbidity) ([place]) 03/20/2015  . Leg edema, right 10/03/2014  . Genetic testing 07/10/2014  . Breast cancer of lower-inner quadrant of right female breast ([place]) 05/29/2014    [name] ,PT, DPT, E-RYT  01/16/2016, 10:15 PM  [place] MAIN [facility] SERVICES [address] [place], [place], [address] Phone: [phone]   Fax:  [phone]  Name: Krystal Kelley MRN: [id number] Date of Birth: Jan 31, 1971

## 2016-01-19 ENCOUNTER — Ambulatory Visit: Payer: BLUE CROSS/BLUE SHIELD | Attending: Hematology and Oncology | Admitting: Physical Therapy

## 2016-01-19 DIAGNOSIS — G8929 Other chronic pain: Secondary | ICD-10-CM

## 2016-01-19 DIAGNOSIS — R279 Unspecified lack of coordination: Secondary | ICD-10-CM | POA: Diagnosis present

## 2016-01-19 DIAGNOSIS — R2689 Other abnormalities of gait and mobility: Secondary | ICD-10-CM

## 2016-01-19 DIAGNOSIS — M25551 Pain in right hip: Secondary | ICD-10-CM | POA: Diagnosis not present

## 2016-01-19 DIAGNOSIS — M25511 Pain in right shoulder: Secondary | ICD-10-CM | POA: Diagnosis present

## 2016-01-20 NOTE — Therapy (Signed)
[place] MAIN [facility] SERVICES [address] [place], [place], [address] Phone: [phone]   Fax:  [phone]  Physical Therapy Treatment/ Discharge Summary  Patient Details  Name: Krystal Kelley MRN: [id number] Date of Birth: 30-Jul-1970 Referring Provider: Lindi Adie  Encounter Date: 01/19/2016      PT End of Session - 01/20/16 2156    Visit Number 29   Date for PT Re-Evaluation 01/20/16   PT Start Time 1105   PT Stop Time 1200   PT Time Calculation (min) 55 min   Activity Tolerance Patient tolerated treatment well;No increased pain   Behavior During Therapy WFL for tasks assessed/performed      Past Medical History:  Diagnosis Date  . Breast cancer, right breast ([place])   . Hypertension   . Radiation 10/30/14-12/17/14   right breast 50.4 gray    Past Surgical History:  Procedure Laterality Date  . BREAST BIOPSY  05/16/14  . bunion surgrery    . carpel tunnel    . CHOLECYSTECTOMY  2015  . PORTACATH PLACEMENT Left 06/18/2014   Procedure: INSERTION PORT-A-CATH;  Surgeon: Excell Seltzer, MD;  Location: [facility];  Service: General;  Laterality: Left;    There were no vitals filed for this visit.      Subjective Assessment - 01/20/16 2202    Subjective Pt reported she did alot for her niece and nephews over the weekend and feel tired.            [facility] PT Assessment - 01/20/16 2147      Other:   Other/ Comments minor cuing for alignment with tricep curls, lat pull down in mini squat, stretches (extended side angle, hip flexor twist)         Treatment:   10 reps  tricep curls, leg press, lunges with dumbbells, cable machines in PNF patterns,  lat pull down in mini squat, stretches (extended side angle, hip flexor twist)                      PT Education - 01/20/16 2155    Education provided Yes   Education Details D/C   Person(s) Educated Patient   Methods  Explanation;Demonstration;Tactile cues;Verbal cues;Handout   Comprehension Returned demonstration;Verbalized understanding             PT Long Term Goals - 01/19/16 1112      PT LONG TERM GOAL #1   Title Pt will report being able to walk 5 blocks without pain in order to exercise.   Time 12   Period Weeks   Status Achieved     PT LONG TERM GOAL #2   Title Pt will have increased functional ROM with RUE for wiping and cleaning back and bottom.    Time 12   Period Weeks   Status Achieved     PT LONG TERM GOAL #3   Title Pt will be able to lay on her R side without interruption of pain in order to increase sleep quality.    Time 12   Period Weeks   Status Achieved     PT LONG TERM GOAL #4   Title Pt will decrease her Fatigue Severity Scale from 70% to < 50% in order to perform her ADLs, walk and exercise. (6/2: 87%, 01/19/16: 93% )    Time 12   Period Weeks   Status Not Met     PT LONG TERM GOAL #5   Title Pt  will increase her LEFS score from 73% to  80% in order to play sports, walk, and squat. (6/2: 93%)    Status Achieved     PT LONG TERM GOAL #6   Title Pt will demo no lumbopelvic instability with 5 reps of Level 2 and 3 exercsies of the dynamic stabilization exercises in order to minimize injuries risks with fitness training.   Time 12   Period Weeks   Status Partially Met     PT LONG TERM GOAL #7   Title Pt will be able to demo the full form of breast stroke without cues for 50 ft (2 laps) without stopping for rest in order to demo IND with endurance training.   Time 12   Period Weeks   Status Achieved               Plan - 01/20/16 2203    Clinical Impression Statement Pt has achieved 5/7 goals and is working on the remainder 2 goals.  Pt reports her ROM in her arms and hip pain have improved  "a great deal better " according to the [facility] scale. Pt continues to voice understanding with fatigue management strategies and expressed motivation to continue  working on decreasing fatigue. Pt showed proper alignment and technique with use of weight machines and dumbbells for return to gym routines. Pt was educated on preventative care as a breast cancer survivor.  Pt is ready for d/c.     Rehab Potential Good   Clinical Impairments Affecting Rehab Potential none    PT Frequency 2x / week   PT Duration 12 weeks   PT Treatment/Interventions ADLs/Self Care Home Management;Aquatic Therapy;Cryotherapy;Balance training;Neuromuscular re-education;Gait training;Therapeutic exercise;Therapeutic activities;Functional mobility training;Stair training;Patient/family education;Manual techniques;Taping;Energy conservation;Scar mobilization   Consulted and Agree with Plan of Care Patient      Patient will benefit from skilled therapeutic intervention in order to improve the following deficits and impairments:  Decreased range of motion, Decreased strength, Impaired UE functional use, Hypomobility, Abnormal gait, Decreased activity tolerance, Improper body mechanics, Pain, Postural dysfunction, Increased muscle spasms, Decreased safety awareness, Decreased coordination, Decreased endurance, Decreased scar mobility  Visit Diagnosis: Pain in right hip  Chronic right shoulder pain  Other abnormalities of gait and mobility  Unspecified lack of coordination     Problem List Patient Active Problem List   Diagnosis Date Noted  . Obesity (BMI 35.0-39.9 without comorbidity) 03/20/2015  . Leg edema, right 10/03/2014  . Genetic testing 07/10/2014  . Breast cancer of lower-inner quadrant of right female breast ([place]) 05/29/2014    [name] ,PT, DPT, E-RYT  01/20/2016, 10:03 PM  [place] MAIN [facility] SERVICES [address] [place], [place], [address] Phone: [phone]   Fax:  [phone]  Name: Krystal Kelley MRN: [id number] Date of Birth: 1970/12/21

## 2016-02-04 ENCOUNTER — Telehealth: Payer: Self-pay | Admitting: *Deleted

## 2016-02-04 NOTE — Telephone Encounter (Signed)
1. Pt reports she had to stop Physical Therapy a few weeks ago due to insurance stopped paying for her visits. She was doing "water therapy" which she felt was helping.  Since she stopped her Right arm has gotten weaker ands he is having difficulty lifting her arm up and picking up things d/t the weakness.  She asks if Dr. Lindi Adie can order more therapy?  Instructed pt to contact [facility] to request they contact her insurance company to authorize more visits. They will also contact Dr. Lindi Adie for orders.  Sure Dr. Lindi Adie will sign orders if they are requested.  Pt will call the [facility].  2.  Pt c/o ongoing cramping in legs and feet at night.   States can be very severe at times and wakes her up multiple times per night.  She has history of cramping at night but it is worse since starting on Tamoxifen.  Says she was told to take Tamoxifen at night instead of morning but this has not helped?    I see note where pt was instructed to drink plenty of fluid and drink Tonic Water before bed.    Pt says she forgot about the tonic water but she will try that.

## 2016-02-18 ENCOUNTER — Ambulatory Visit (INDEPENDENT_AMBULATORY_CARE_PROVIDER_SITE_OTHER): Payer: BLUE CROSS/BLUE SHIELD | Admitting: Podiatry

## 2016-02-18 DIAGNOSIS — M722 Plantar fascial fibromatosis: Secondary | ICD-10-CM

## 2016-02-18 NOTE — Progress Notes (Signed)
She presents today for a chief complaint of pain to her left heel. She states that after her fashion show her left heel started hurting pretty badly.  Objective: Vital signs are stable she is alert and oriented 3 pain on palpation may continue tubercle left.  Assessment: Pain in limb secondary to plantar fasciitis left.  Plan: Injected left heel. Kenalog and local anesthetic 10 mg

## 2016-02-19 ENCOUNTER — Encounter (HOSPITAL_COMMUNITY): Payer: Self-pay | Admitting: Emergency Medicine

## 2016-02-19 ENCOUNTER — Emergency Department (HOSPITAL_COMMUNITY)
Admission: EM | Admit: 2016-02-19 | Discharge: 2016-02-20 | Disposition: A | Discharge status: Home / Self Care | Payer: BLUE CROSS/BLUE SHIELD | Attending: Emergency Medicine | Admitting: Emergency Medicine

## 2016-02-19 ENCOUNTER — Emergency Department (HOSPITAL_COMMUNITY): Payer: BLUE CROSS/BLUE SHIELD

## 2016-02-19 DIAGNOSIS — B029 Zoster without complications: Secondary | ICD-10-CM | POA: Diagnosis not present

## 2016-02-19 DIAGNOSIS — N390 Urinary tract infection, site not specified: Secondary | ICD-10-CM | POA: Diagnosis not present

## 2016-02-19 DIAGNOSIS — R21 Rash and other nonspecific skin eruption: Secondary | ICD-10-CM | POA: Diagnosis not present

## 2016-02-19 DIAGNOSIS — Z853 Personal history of malignant neoplasm of breast: Secondary | ICD-10-CM | POA: Diagnosis not present

## 2016-02-19 DIAGNOSIS — I10 Essential (primary) hypertension: Secondary | ICD-10-CM | POA: Diagnosis not present

## 2016-02-19 DIAGNOSIS — R1031 Right lower quadrant pain: Secondary | ICD-10-CM | POA: Diagnosis present

## 2016-02-19 LAB — URINALYSIS, ROUTINE W REFLEX MICROSCOPIC
Bilirubin Urine: NEGATIVE
Glucose, UA: NEGATIVE mg/dL
Hgb urine dipstick: NEGATIVE
Ketones, ur: NEGATIVE mg/dL
Nitrite: NEGATIVE
Protein, ur: NEGATIVE mg/dL
Specific Gravity, Urine: 1.025 (ref 1.005–1.030)
pH: 7 (ref 5.0–8.0)

## 2016-02-19 LAB — COMPREHENSIVE METABOLIC PANEL
ALT: 42 U/L (ref 14–54)
CO2: 28 mmol/L (ref 22–32)
Chloride: 103 mmol/L (ref 101–111)
Creatinine, Ser: 1.1 mg/dL — ABNORMAL HIGH (ref 0.44–1.00)
GFR calc Af Amer: >60 mL/min (ref >60)
Sodium: 139 mmol/L (ref 135–145)

## 2016-02-19 LAB — COMPREHENSIVE METABOLIC PANEL WITH GFR
AST: 45 U/L — ABNORMAL HIGH (ref 15–41)
Albumin: 3.5 g/dL (ref 3.5–5.0)
Alkaline Phosphatase: 49 U/L (ref 38–126)
Anion gap: 8 (ref 5–15)
BUN: 12 mg/dL (ref 6–20)
Calcium: 8.9 mg/dL (ref 8.9–10.3)
GFR calc non Af Amer: 59 mL/min — ABNORMAL LOW (ref >60)
Glucose, Bld: 101 mg/dL — ABNORMAL HIGH (ref 65–99)
Potassium: 3.1 mmol/L — ABNORMAL LOW (ref 3.5–5.1)
Total Bilirubin: 0.5 mg/dL (ref 0.3–1.2)
Total Protein: 6.8 g/dL (ref 6.5–8.1)

## 2016-02-19 LAB — URINE MICROSCOPIC-ADD ON

## 2016-02-19 LAB — LIPASE, BLOOD: Lipase: 36 U/L (ref 11–51)

## 2016-02-19 LAB — CBC
HCT: 36.8 % (ref 36.0–46.0)
Hemoglobin: 12.5 g/dL (ref 12.0–15.0)
MCH: 31.6 pg (ref 26.0–34.0)
MCHC: 34 g/dL (ref 30.0–36.0)
MCV: 93.2 fL (ref 78.0–100.0)
Platelets: 307 K/uL (ref 150–400)
RBC: 3.95 MIL/uL (ref 3.87–5.11)
RDW: 13.4 % (ref 11.5–15.5)
WBC: 15.5 K/uL — ABNORMAL HIGH (ref 4.0–10.5)

## 2016-02-19 MED ORDER — IOPAMIDOL (ISOVUE-300) INJECTION 61%
INTRAVENOUS | Status: AC
  Administered 2016-02-19: 100 mL
  Filled 2016-02-19: qty 100

## 2016-02-19 MED ORDER — HYDROMORPHONE HCL 2 MG/ML IJ SOLN
1.0000 mg | Freq: Once | INTRAMUSCULAR | Status: AC
  Administered 2016-02-19: 1 mg via INTRAVENOUS
  Filled 2016-02-19: qty 1

## 2016-02-19 MED ORDER — SODIUM CHLORIDE 0.9 % IV BOLUS (SEPSIS)
1000.0000 mL | Freq: Once | INTRAVENOUS | Status: AC
  Administered 2016-02-19: 1000 mL via INTRAVENOUS

## 2016-02-19 NOTE — ED Notes (Signed)
Patient transported to CT 

## 2016-02-19 NOTE — ED Provider Notes (Signed)
[place] DEPT Provider Note   CSN: [id number] Arrival date & time: 02/19/16  1934  By signing my name below, I, Reola Mosher, attest that this documentation has been prepared under the direction and in the presence of Virgel Manifold, MD. Electronically Signed: Reola Mosher, ED Scribe. 02/19/16. 11:15 PM.  History   Chief Complaint Chief Complaint  Patient presents with  . Abdominal Pain    lower right  . Chills  . Generalized Body Aches   The history is provided by the patient and medical records. No language interpreter was used.   HPI Comments: Krystal Kelley is a 45 y.o. female with a PMHx of breast cancer (in remission) and HTN, who presents to the Emergency Department complaining of constant right-sided, lower abdominal pain onset approximately 11 hours ago. No radiation of pain. Pt reports associated chills, diffuse headache, waxing and waning fever (Tmax 102), and diffuse myalgias secondary to her abdominal pain. She additionally notes that there is an area of redness to her right-sided, upper abdomen and she denies that area being pruritic. Her pain is exacerbated with generalized movement and palpation to the area. She took one Tylenol with mild relief of her fever, but with minimal relief of her current abdominal pain. Pt states that prior to the onset of her symptoms that she was feeling at her baseline. Pt has a prior PSHx including a cholecystectomy. Denies urgency, frequency, hematuria, dysuria, difficulty urinating, vaginal discharge, vaginal bleeding, nausea, vomiting, or any other associated symptoms.   Past Medical History:  Diagnosis Date  . Breast cancer, right breast ([place])   . Hypertension   . Radiation 10/30/14-12/17/14   right breast 50.4 gray   Patient Active Problem List   Diagnosis Date Noted  . Obesity (BMI 35.0-39.9 without comorbidity) 03/20/2015  . Leg edema, right 10/03/2014  . Genetic testing 07/10/2014  . Breast cancer of  lower-inner quadrant of right female breast ([place]) 05/29/2014   Past Surgical History:  Procedure Laterality Date  . BREAST BIOPSY  05/16/14  . bunion surgrery    . carpel tunnel    . CHOLECYSTECTOMY  2015  . PLANTAR FASCIECTOMY    . PORTACATH PLACEMENT Left 06/18/2014   Procedure: INSERTION PORT-A-CATH;  Surgeon: Excell Seltzer, MD;  Location: [facility];  Service: General;  Laterality: Left;   OB History    No data available     Home Medications    Prior to Admission medications   Medication Sig Start Date End Date Taking? Authorizing Provider  furosemide (LASIX) 40 MG tablet Take 40 mg by mouth 2 (two) times daily.   Yes Historical Provider, MD  hydrochlorothiazide (HYDRODIURIL) 25 MG tablet Take 25 mg by mouth daily.   Yes Historical Provider, MD  tamoxifen (NOLVADEX) 20 MG tablet TAKE 1 TABLET BY MOUTH DAILY 01/08/16  Yes Nicholas Lose, MD  VIBERZI 100 MG TABS TAKE 1 TABLET BY MOUTH TWICE DAILY 10/31/15  Yes Lucilla Lame, MD  Vitamin D, Ergocalciferol, (DRISDOL) 50000 units CAPS capsule Take 50,000 Units by mouth every 7 (seven) days. On Sunday   Yes Historical Provider, MD  furosemide (LASIX) 40 MG tablet TAKE 1 TABLET BY MOUTH DAILY AS NEEDED Patient not taking: Reported on 02/19/2016 01/08/16   Nicholas Lose, MD  lidocaine-prilocaine (EMLA) cream Apply 1 application topically as needed. Patient not taking: Reported on 02/19/2016 07/18/14   Nicholas Lose, MD   Family History Family History  Problem Relation Age of Onset  . Kidney cancer Father  currently 68  . Prostate cancer Maternal Uncle     deceased 18  . Lung cancer Maternal Grandfather     deceased 66; smoker  . Colon cancer Paternal Grandfather     deceased 60s  . Cancer Other     pat half-sister; ? uterine vs. ovarian ca   Social History Social History  Substance Use Topics  . Smoking status: Never Smoker  . Smokeless tobacco: Never Used  . Alcohol use Yes     Comment: occ   Allergies     Review of patient's allergies indicates no known allergies.  Review of Systems Review of Systems  Constitutional: Positive for chills and fever (Tmax 102).  Gastrointestinal: Positive for abdominal pain. Negative for nausea and vomiting.  Genitourinary: Negative for difficulty urinating, dysuria, frequency, hematuria, urgency, vaginal bleeding and vaginal discharge.  Musculoskeletal: Positive for myalgias (generalized).  Skin: Positive for color change.  Neurological: Positive for headaches.  All other systems reviewed and are negative.  Physical Exam Updated Vital Signs BP 188/95   Pulse 100   Temp 102.6 F (39.2 C) (Oral)   Resp 16   Ht 5\' 5"  (1.651 m)   Wt 250 lb (113.4 kg)   SpO2 100%   BMI 41.60 kg/m   Physical Exam  Constitutional: She appears well-developed and well-nourished.  HENT:  Head: Normocephalic.  Right Ear: External ear normal.  Left Ear: External ear normal.  Nose: Nose normal.  Mouth/Throat: Oropharynx is clear and moist.  Eyes: Conjunctivae are normal. Right eye exhibits no discharge. Left eye exhibits no discharge.  Neck: Normal range of motion.  Cardiovascular: Normal rate, regular rhythm and normal heart sounds.   No murmur heard. Pulmonary/Chest: Effort normal and breath sounds normal. No respiratory distress. She has no wheezes. She has no rales.  Abdominal: Soft. She exhibits no distension. There is tenderness. There is guarding. There is no rebound.  TTP over the RLQ w/ voluntary guarding. Splotchy, erythematous rash over the RUQ which is non-TTP.   Musculoskeletal: Normal range of motion. She exhibits no edema or tenderness.  Neurological: She is alert. No cranial nerve deficit. Coordination normal.  Skin: Skin is warm and dry. Rash noted. There is erythema.  Psychiatric: She has a normal mood and affect. Her behavior is normal.  Nursing note and vitals reviewed.  ED Treatments / Results  DIAGNOSTIC STUDIES: Oxygen Saturation is 100% on  RA, normal by my interpretation.   COORDINATION OF CARE: 11:15 PM-Discussed next steps with pt. Pt verbalized understanding and is agreeable with the plan.   Labs (all labs ordered are listed, but only abnormal results are displayed) Labs Reviewed  COMPREHENSIVE METABOLIC PANEL - Abnormal; Notable for the following:       Result Value   Potassium 3.1 (*)    Glucose, Bld 101 (*)    Creatinine, Ser 1.10 (*)    AST 45 (*)    GFR calc non Af Amer 59 (*)    All other components within normal limits  CBC - Abnormal; Notable for the following:    WBC 15.5 (*)    All other components within normal limits  URINALYSIS, ROUTINE W REFLEX MICROSCOPIC (NOT AT [facility]) - Abnormal; Notable for the following:    APPearance CLOUDY (*)    Leukocytes, UA MODERATE (*)    All other components within normal limits  URINE MICROSCOPIC-ADD ON - Abnormal; Notable for the following:    Squamous Epithelial / LPF 6-30 (*)    Bacteria, UA FEW (*)  All other components within normal limits  URINE CULTURE  LIPASE, BLOOD   EKG  EKG Interpretation None      Radiology Ct Abdomen Pelvis W Contrast  Result Date: 02/20/2016 CLINICAL DATA:  Right-sided flank pain onset today. EXAM: CT ABDOMEN AND PELVIS WITH CONTRAST TECHNIQUE: Multidetector CT imaging of the abdomen and pelvis was performed using the standard protocol following bolus administration of intravenous contrast. CONTRAST:  100 ISOVUE-300 IOPAMIDOL (ISOVUE-300) INJECTION 61% COMPARISON:  None. FINDINGS: Lower chest: No acute abnormality. Hepatobiliary: Mild diffuse fatty infiltration of the liver. No significant focal liver lesion. Cholecystectomy. Normal bile ducts. Pancreas: Unremarkable. No pancreatic ductal dilatation or surrounding inflammatory changes. Spleen: Normal in size without focal abnormality. Adrenals/Urinary Tract: Adrenal glands are unremarkable. Kidneys are normal, without renal calculi, focal lesion, or hydronephrosis. Bladder is  unremarkable. Stomach/Bowel: Stomach is within normal limits. Appendix appears normal. No evidence of bowel wall thickening, distention, or inflammatory changes. Vascular/Lymphatic: No significant vascular findings are present. No enlarged abdominal or pelvic lymph nodes. Reproductive: Multiple uterine masses, presumably fibroids. These measure up to 9 cm, appearing larger than on the ultrasound of 06/10/2008. No adnexal lesions are evident. Other: Fat containing umbilical hernia.  Rectus abdominus diastases. No acute inflammatory changes are evident in the abdomen or pelvis. No ascites. Musculoskeletal: No acute or significant osseous findings. IMPRESSION: 1. No acute inflammatory changes are evident the abdomen or pelvis. 2. Multiple uterine masses, presumably fibroids, measuring up to 9 cm. These are enlarged from 2010. Consider pelvic ultrasound or pelvic MRI for optimal characterization. 3. Diffuse fatty infiltration of the liver. 4. Fat containing umbilical hernia. Electronically Signed   By: Andreas Newport M.D.   On: 02/20/2016 00:10    Procedures Procedures   Medications Ordered in ED Medications - No data to display  Initial Impression / Assessment and Plan / ED Course  I have reviewed the triage vital signs and the nursing notes.  Pertinent labs & imaging results that were available during my care of the patient were reviewed by me and considered in my medical decision making (see chart for details).  Clinical Course   45yF with RLQ pain. CT w/o acute abnormality. UA consistent with UTI. HD stable. Nontoxic. I feel she is appropriate for outpt tx.   Final Clinical Impressions(s) / ED Diagnoses   Final diagnoses:  Lower urinary tract infectious disease  Right lower quadrant abdominal pain   New Prescriptions New Prescriptions   No medications on file   I personally preformed the services scribed in my presence. The recorded information has been reviewed is accurate. Virgel Manifold, MD.     Virgel Manifold, MD 02/23/16 [phone]

## 2016-02-19 NOTE — ED Triage Notes (Signed)
Pt complains of generalized body aches, chills, lower right abd pain, and a headache. Pt states this all started today at 1230.

## 2016-02-20 ENCOUNTER — Encounter: Payer: Self-pay | Admitting: *Deleted

## 2016-02-20 ENCOUNTER — Emergency Department (HOSPITAL_COMMUNITY)
Admission: EM | Admit: 2016-02-20 | Discharge: 2016-02-20 | Disposition: A | Discharge status: Home / Self Care | Payer: BLUE CROSS/BLUE SHIELD | Attending: Emergency Medicine | Admitting: Emergency Medicine

## 2016-02-20 ENCOUNTER — Encounter (HOSPITAL_COMMUNITY): Payer: Self-pay | Admitting: Nurse Practitioner

## 2016-02-20 DIAGNOSIS — Z853 Personal history of malignant neoplasm of breast: Secondary | ICD-10-CM | POA: Insufficient documentation

## 2016-02-20 DIAGNOSIS — R21 Rash and other nonspecific skin eruption: Secondary | ICD-10-CM | POA: Diagnosis present

## 2016-02-20 DIAGNOSIS — I10 Essential (primary) hypertension: Secondary | ICD-10-CM | POA: Diagnosis not present

## 2016-02-20 DIAGNOSIS — B029 Zoster without complications: Secondary | ICD-10-CM | POA: Insufficient documentation

## 2016-02-20 DIAGNOSIS — E876 Hypokalemia: Secondary | ICD-10-CM | POA: Diagnosis not present

## 2016-02-20 DIAGNOSIS — Z79899 Other long term (current) drug therapy: Secondary | ICD-10-CM | POA: Diagnosis not present

## 2016-02-20 LAB — COMPREHENSIVE METABOLIC PANEL
ALT: 43 U/L (ref 14–54)
AST: 43 U/L — ABNORMAL HIGH (ref 15–41)
Albumin: 3.2 g/dL — ABNORMAL LOW (ref 3.5–5.0)
Alkaline Phosphatase: 52 U/L (ref 38–126)
BUN: 8 mg/dL (ref 6–20)
Calcium: 8.4 mg/dL — ABNORMAL LOW (ref 8.9–10.3)
Creatinine, Ser: 0.99 mg/dL (ref 0.44–1.00)
Glucose, Bld: 128 mg/dL — ABNORMAL HIGH (ref 65–99)
Total Bilirubin: 0.6 mg/dL (ref 0.3–1.2)
Total Protein: 6.6 g/dL (ref 6.5–8.1)

## 2016-02-20 LAB — CBC WITH DIFFERENTIAL/PLATELET
Basophils Absolute: 0 K/uL (ref 0.0–0.1)
Basophils Relative: 0 %
Eosinophils Absolute: 0 10*3/uL (ref 0.0–0.7)
Eosinophils Relative: 0 %
HCT: 35.2 % — ABNORMAL LOW (ref 36.0–46.0)
Hemoglobin: 11.9 g/dL — ABNORMAL LOW (ref 12.0–15.0)
Lymphocytes Relative: 15 %
Lymphs Abs: 2.1 10*3/uL (ref 0.7–4.0)
MCH: 31.2 pg (ref 26.0–34.0)
MCHC: 33.8 g/dL (ref 30.0–36.0)
MCV: 92.4 fL (ref 78.0–100.0)
Monocytes Absolute: 0.9 10*3/uL (ref 0.1–1.0)
Monocytes Relative: 7 %
Neutro Abs: 11.1 K/uL — ABNORMAL HIGH (ref 1.7–7.7)
Neutrophils Relative %: 78 %
Platelets: 279 K/uL (ref 150–400)
RBC: 3.81 MIL/uL — ABNORMAL LOW (ref 3.87–5.11)
RDW: 13.6 % (ref 11.5–15.5)
WBC: 14.2 K/uL — ABNORMAL HIGH (ref 4.0–10.5)

## 2016-02-20 LAB — URINALYSIS, ROUTINE W REFLEX MICROSCOPIC
Bilirubin Urine: NEGATIVE
Glucose, UA: NEGATIVE mg/dL
Hgb urine dipstick: NEGATIVE
Ketones, ur: NEGATIVE mg/dL
Leukocytes, UA: NEGATIVE
Nitrite: NEGATIVE
Protein, ur: NEGATIVE mg/dL
Specific Gravity, Urine: 1.02 (ref 1.005–1.030)
pH: 6 (ref 5.0–8.0)

## 2016-02-20 LAB — COMPREHENSIVE METABOLIC PANEL WITH GFR
Anion gap: 9 (ref 5–15)
CO2: 25 mmol/L (ref 22–32)
Chloride: 103 mmol/L (ref 101–111)
GFR calc Af Amer: >60 mL/min (ref >60)
GFR calc non Af Amer: >60 mL/min (ref >60)
Potassium: 3.1 mmol/L — ABNORMAL LOW (ref 3.5–5.1)
Sodium: 137 mmol/L (ref 135–145)

## 2016-02-20 LAB — I-STAT CG4 LACTIC ACID, ED: Lactic Acid, Venous: 1.74 mmol/L (ref 0.5–1.9)

## 2016-02-20 MED ORDER — POTASSIUM CHLORIDE CRYS ER 20 MEQ PO TBCR
80.0000 meq | EXTENDED_RELEASE_TABLET | Freq: Once | ORAL | Status: AC
  Administered 2016-02-20: 80 meq via ORAL
  Filled 2016-02-20: qty 4

## 2016-02-20 MED ORDER — POTASSIUM CHLORIDE CRYS ER 20 MEQ PO TBCR: 20.0000 meq | EXTENDED_RELEASE_TABLET | Freq: Every day | ORAL | 0 refills | Status: DC

## 2016-02-20 MED ORDER — ACETAMINOPHEN 325 MG PO TABS
650.0000 mg | ORAL_TABLET | Freq: Once | ORAL | Status: AC | PRN
  Administered 2016-02-20: 650 mg via ORAL

## 2016-02-20 MED ORDER — OXYCODONE HCL 5 MG PO TABS
10.0000 mg | ORAL_TABLET | Freq: Once | ORAL | Status: AC
  Administered 2016-02-20: 10 mg via ORAL
  Filled 2016-02-20: qty 2

## 2016-02-20 MED ORDER — CEPHALEXIN 500 MG PO CAPS: 500.0000 mg | ORAL_CAPSULE | Freq: Four times a day (QID) | ORAL | 0 refills | Status: DC

## 2016-02-20 MED ORDER — IBUPROFEN 400 MG PO TABS
600.0000 mg | ORAL_TABLET | Freq: Once | ORAL | Status: AC
  Administered 2016-02-20: 600 mg via ORAL
  Filled 2016-02-20: qty 1

## 2016-02-20 MED ORDER — OXYCODONE HCL 5 MG PO TABS: 5.0000 mg | ORAL_TABLET | Freq: Three times a day (TID) | ORAL | 0 refills | Status: DC | PRN

## 2016-02-20 MED ORDER — VALACYCLOVIR HCL 1 G PO TABS: 1000.0000 mg | ORAL_TABLET | Freq: Three times a day (TID) | ORAL | 0 refills | Status: DC

## 2016-02-20 MED ORDER — ACETAMINOPHEN 325 MG PO TABS
ORAL_TABLET | ORAL | Status: AC
  Filled 2016-02-20: qty 2

## 2016-02-20 MED ORDER — DEXTROSE 5 % IV SOLN
1.0000 g | Freq: Once | INTRAVENOUS | Status: AC
  Administered 2016-02-20: 1 g via INTRAVENOUS
  Filled 2016-02-20: qty 10

## 2016-02-20 MED ORDER — VALACYCLOVIR HCL 500 MG PO TABS
1000.0000 mg | ORAL_TABLET | Freq: Two times a day (BID) | ORAL | Status: DC
  Administered 2016-02-20: 1000 mg via ORAL
  Filled 2016-02-20: qty 2

## 2016-02-20 NOTE — ED Provider Notes (Signed)
[facility] DEPT Provider Note   CSN: [id number] Arrival date & time: 02/20/16  1750     History   Chief Complaint Chief Complaint  Patient presents with  . Rash  . Urinary Tract Infection    HPI Krystal Kelley is a 45 y.o. female with a hx of Breast cancer (in remission - last chemo March 2017), hypertension presents to the Emergency Department complaining of gradual, persistent, progressively worsening right-sided lower abdominal pain onset approximately 2 days ago with development of a new rash to the right side of her abdomen within the last 24 hours. Patient was evaluated in the emergency department yesterday complaining of similar pain, chills, diffuse headache, waxing and waning fever with a MAXIMUM TEMPERATURE at home of 102 and diffuse myalgias.  She reported that her pain was worse with movement and palpation to the area. Additionally yesterday she noted an area of redness to the right upper side of her abdomen but denied the area being pruritic.  Patient has surgical history of cholecystectomy. She denied any urinary symptoms, vaginal discharge, vaginal bleeding, nausea, vomiting or other associated symptoms.  Today patient with continued pain, worse with movement.  She describes the pain as burning and the rash has now developed "bumps."  She reports that overall she is feeling much better with complete resolution of her headache, chills, body aches.  She was febrile on arrival in the emergency department but has not been treating this at home.  He reports eating and drinking normally.  She denies vision changes, numbness, tingling, weakness, neck pain, neck stiffness, nausea, vomiting, diarrhea. She continues to deny urinary or vaginal symptoms.  The history is provided by the patient, medical records and a relative. No language interpreter was used.    Past Medical History:  Diagnosis Date  . Breast cancer, right breast ([place])   . Hypertension   . Radiation  10/30/14-12/17/14   right breast 50.4 gray    Patient Active Problem List   Diagnosis Date Noted  . Obesity (BMI 35.0-39.9 without comorbidity) 03/20/2015  . Leg edema, right 10/03/2014  . Genetic testing 07/10/2014  . Breast cancer of lower-inner quadrant of right female breast ([place]) 05/29/2014    Past Surgical History:  Procedure Laterality Date  . BREAST BIOPSY  05/16/14  . bunion surgrery    . carpel tunnel    . CHOLECYSTECTOMY  2015  . PLANTAR FASCIECTOMY    . PORTACATH PLACEMENT Left 06/18/2014   Procedure: INSERTION PORT-A-CATH;  Surgeon: Excell Seltzer, MD;  Location: [facility];  Service: General;  Laterality: Left;    OB History    No data available       Home Medications    Prior to Admission medications   Medication Sig Start Date End Date Taking? Authorizing Provider  cephALEXin (KEFLEX) 500 MG capsule Take 1 capsule (500 mg total) by mouth 4 (four) times daily. 02/20/16  Yes Virgel Manifold, MD  cycloSPORINE (RESTASIS) 0.05 % ophthalmic emulsion Place 1 drop into both eyes 2 (two) times daily.   Yes Historical Provider, MD  hydrochlorothiazide (HYDRODIURIL) 25 MG tablet Take 25 mg by mouth daily.   Yes Historical Provider, MD  tamoxifen (NOLVADEX) 20 MG tablet TAKE 1 TABLET BY MOUTH DAILY 01/08/16  Yes Nicholas Lose, MD  VIBERZI 100 MG TABS TAKE 1 TABLET BY MOUTH TWICE DAILY 10/31/15  Yes Lucilla Lame, MD  Vitamin D, Ergocalciferol, (DRISDOL) 50000 units CAPS capsule Take 50,000 Units by mouth every 7 (seven) days. On Sunday  Yes Historical Provider, MD  oxyCODONE (ROXICODONE) 5 MG immediate release tablet Take 1 tablet (5 mg total) by mouth every 8 (eight) hours as needed for severe pain. 02/20/16   Sunset Joshi, PA-C  potassium chloride SA (K-DUR,KLOR-CON) 20 MEQ tablet Take 1 tablet (20 mEq total) by mouth daily. 02/20/16   Maxamillion Banas, PA-C  valACYclovir (VALTREX) 1000 MG tablet Take 1 tablet (1,000 mg total) by mouth 3 (three) times  daily. 02/20/16 03/05/16  Jarrett Soho Brooklinn Longbottom, PA-C    Family History Family History  Problem Relation Age of Onset  . Kidney cancer Father     currently 56  . Prostate cancer Maternal Uncle     deceased 17  . Lung cancer Maternal Grandfather     deceased 51; smoker  . Colon cancer Paternal Grandfather     deceased 19s  . Cancer Other     pat half-sister; ? uterine vs. ovarian ca    Social History Social History  Substance Use Topics  . Smoking status: Never Smoker  . Smokeless tobacco: Never Used  . Alcohol use Yes     Comment: occ     Allergies   Review of patient's allergies indicates no known allergies.   Review of Systems Review of Systems  Gastrointestinal: Positive for abdominal pain.  Skin: Positive for rash.  All other systems reviewed and are negative.    Physical Exam Updated Vital Signs BP 127/73 (BP Location: Left Arm)   Pulse 95   Temp 100.1 F (37.8 C) (Oral)   Resp 18   SpO2 97%   Physical Exam  Constitutional: She is oriented to person, place, and time. She appears well-developed and well-nourished. No distress.  Awake, alert, nontoxic appearance  HENT:  Head: Normocephalic and atraumatic.  Right Ear: External ear normal.  Left Ear: External ear normal.  Nose: Nose normal.  No lesions to the face, around her eyes or to the nose  Eyes: Conjunctivae are normal. No scleral icterus.  Neck: Normal range of motion. Neck supple.  Patent airway No stridor; normal phonation Handling secretions without difficulty  Cardiovascular: Normal rate, regular rhythm, normal heart sounds and intact distal pulses.   No murmur heard. Pulmonary/Chest: Effort normal and breath sounds normal. No stridor. No respiratory distress. She has no wheezes.  Equal chest expansion  Abdominal: Soft. Bowel sounds are normal. She exhibits no mass. There is tenderness in the right lower quadrant and suprapubic area. There is no rebound and no guarding.  Musculoskeletal:  Normal range of motion. She exhibits no edema.  Neurological: She is alert and oriented to person, place, and time.  Speech is clear and goal oriented Moves extremities without ataxia  Skin: Skin is warm and dry. Rash noted. She is not diaphoretic.  Large area of erythema to the right upper quadrant, right flank with large cluster of vesicles extending to the midline of her abdomen but not beyond; tender to palpation at the site  Psychiatric: She has a normal mood and affect.  Nursing note and vitals reviewed.    ED Treatments / Results  Labs (all labs ordered are listed, but only abnormal results are displayed) Labs Reviewed  COMPREHENSIVE METABOLIC PANEL - Abnormal; Notable for the following:       Result Value   Potassium 3.1 (*)    Glucose, Bld 128 (*)    Calcium 8.4 (*)    Albumin 3.2 (*)    AST 43 (*)    All other components within normal limits  CBC WITH DIFFERENTIAL/PLATELET - Abnormal; Notable for the following:    WBC 14.2 (*)    RBC 3.81 (*)    Hemoglobin 11.9 (*)    HCT 35.2 (*)    Neutro Abs 11.1 (*)    All other components within normal limits  URINE CULTURE  URINALYSIS, ROUTINE W REFLEX MICROSCOPIC (NOT AT [facility])  I-STAT CG4 LACTIC ACID, ED    EKG  EKG Interpretation None       Radiology Ct Abdomen Pelvis W Contrast  Result Date: 02/20/2016 CLINICAL DATA:  Right-sided flank pain onset today. EXAM: CT ABDOMEN AND PELVIS WITH CONTRAST TECHNIQUE: Multidetector CT imaging of the abdomen and pelvis was performed using the standard protocol following bolus administration of intravenous contrast. CONTRAST:  100 ISOVUE-300 IOPAMIDOL (ISOVUE-300) INJECTION 61% COMPARISON:  None. FINDINGS: Lower chest: No acute abnormality. Hepatobiliary: Mild diffuse fatty infiltration of the liver. No significant focal liver lesion. Cholecystectomy. Normal bile ducts. Pancreas: Unremarkable. No pancreatic ductal dilatation or surrounding inflammatory changes. Spleen: Normal in size  without focal abnormality. Adrenals/Urinary Tract: Adrenal glands are unremarkable. Kidneys are normal, without renal calculi, focal lesion, or hydronephrosis. Bladder is unremarkable. Stomach/Bowel: Stomach is within normal limits. Appendix appears normal. No evidence of bowel wall thickening, distention, or inflammatory changes. Vascular/Lymphatic: No significant vascular findings are present. No enlarged abdominal or pelvic lymph nodes. Reproductive: Multiple uterine masses, presumably fibroids. These measure up to 9 cm, appearing larger than on the ultrasound of 06/10/2008. No adnexal lesions are evident. Other: Fat containing umbilical hernia.  Rectus abdominus diastases. No acute inflammatory changes are evident in the abdomen or pelvis. No ascites. Musculoskeletal: No acute or significant osseous findings. IMPRESSION: 1. No acute inflammatory changes are evident the abdomen or pelvis. 2. Multiple uterine masses, presumably fibroids, measuring up to 9 cm. These are enlarged from 2010. Consider pelvic ultrasound or pelvic MRI for optimal characterization. 3. Diffuse fatty infiltration of the liver. 4. Fat containing umbilical hernia. Electronically Signed   By: Andreas Newport M.D.   On: 02/20/2016 00:10     Procedures Procedures (including critical care time)  Medications Ordered in ED Medications  valACYclovir (VALTREX) tablet 1,000 mg (not administered)  potassium chloride SA (K-DUR,KLOR-CON) CR tablet 80 mEq (not administered)  oxyCODONE (Oxy IR/ROXICODONE) immediate release tablet 10 mg (not administered)  acetaminophen (TYLENOL) tablet 650 mg (650 mg Oral Given 02/20/16 1824)     Initial Impression / Assessment and Plan / ED Course  I have reviewed the triage vital signs and the nursing notes.  Pertinent labs & imaging results that were available during my care of the patient were reviewed by me and considered in my medical decision making (see chart for details).  Clinical Course    Value Comment By Time  WBC: (!) 14.2 Leukocytosis; improved from yesterday at 15.5 [facility], PA-C 11/03 2116  Potassium: (!) 3.1 Hypokalemia, repletion done in the emergency department [facility], PA-C 11/03 2116  BP: 127/73 Vital signs stable. Patient febrile on arrival. Borderline tachycardia Abigail Butts, PA-C 11/03 2116  Leukocytes, UA: NEGATIVE UA negative; but with leukocytes and white blood cells yesterday.  She reports she has been taking her antibiotic as prescribed. Abigail Butts, PA-C 11/03 2117   Workup yesterday showed CT w/o acute abnormality and UA consistent with UTI. She was hemodynamically stable stable and nontoxic at discharge home.  Jarrett Soho Jermisha Hoffart, PA-C 11/03 2120  Creatinine: 0.99 Improved from 1.1 yesterday Abigail Butts, PA-C 11/03 2123  Lactic Acid, Venous: 1.74 Within normal limits Jarrett Soho  Kayd Launer, PA-C 11/03 2231    BP 115/69 (BP Location: Right Arm)   Pulse 89   Temp 98.9 F (37.2 C) (Oral)   Resp 16   SpO2 100%   Patient with rash consistent with shingles.  Labs are improving since yesterday. CT from yesterday shows multiple uterine masses, likely fibroids but no other acute abnormalities.  Discussed this with patient he reports that she had recent follow-up with her OB/GYN for this. Patient reports that she feels significantly better however is in today for concerns of worsening pain at the site of the rash and worsening rash. Patient was febrile on arrival. Her lactic acid was within normal limits.  Her tachycardia resolved with her fever. No respiratory distress. Alert and mentating appropriately. No nuchal rigidity or meningismus.  Highly doubt aseptic meningitis or encephalitis at this time.  Encouraged patient to continue taking Keflex for urinary tract infection. Urine culture from yesterday is pending. No signs of secondary infection.  Patient started on valacyclovir.  Mild hypokalemia noted, repleted here in the  department and patient discharged home with several days of Klor-Con.  She is nontoxic-appearing. Tolerating by mouth without difficulty. Discussed reasons to return to the emergency department which are listed in her discharge instructions.  Final Clinical Impressions(s) / ED Diagnoses   Final diagnoses:  Herpes zoster without complication  Hypokalemia    New Prescriptions New Prescriptions   OXYCODONE (ROXICODONE) 5 MG IMMEDIATE RELEASE TABLET    Take 1 tablet (5 mg total) by mouth every 8 (eight) hours as needed for severe pain.   POTASSIUM CHLORIDE SA (K-DUR,KLOR-CON) 20 MEQ TABLET    Take 1 tablet (20 mEq total) by mouth daily.   VALACYCLOVIR (VALTREX) 1000 MG TABLET    Take 1 tablet (1,000 mg total) by mouth 3 (three) times daily.     Jarrett Soho Ayren Zumbro, PA-C 02/20/16 [place], MD 02/20/16 [phone]

## 2016-02-20 NOTE — ED Notes (Signed)
Received verbal order to discontinue 2nd I-stat Lactic Acid by ED Provider.

## 2016-02-20 NOTE — Discharge Instructions (Signed)
1. Medications: Use Tylenol and ibuprofen for initial pain control. Add oxycodone as needed for breakthrough pain.  Continue taking Keflex for urinary tract infection.  Take entire course of potassium for low potassium. Continue usual home medications 2. Treatment: rest, drink plenty of fluids, 3. Follow Up: Please followup with your primary doctor in 2 days for discussion of your diagnoses and further evaluation after today's visit; if you do not have a primary care doctor use the resource guide provided to find one; Please return to the ER for persistent fevers, worsening pain, worsening headache, vision changes, confusion, vomiting or other concerns.

## 2016-02-20 NOTE — ED Notes (Signed)
Pt stable, ambulatory, states understanding of discharge instructions 

## 2016-02-20 NOTE — ED Triage Notes (Addendum)
Pt presents with c/o rash, fever. She was in ED yesterday for UTI and given IV and oral abx. She presented yesterday with fevers, chills, body ahces, but was not having urinary symptoms. She also noticed rash to R abd yesterday that has increased in size since and now diffuse and radiating from umbilicus around to her back. The rash is reddened, warm to touch, painful. She denies sob, trouble swallowing. She reports continued malaise, fevers since yesterday. She is alert and breathing easily.

## 2016-02-20 NOTE — ED Notes (Signed)
ED Provider at bedside at this time.

## 2016-02-21 LAB — URINE CULTURE
Culture: 20000 — AB
Culture: NO GROWTH

## 2016-02-22 ENCOUNTER — Telehealth (HOSPITAL_BASED_OUTPATIENT_CLINIC_OR_DEPARTMENT_OTHER): Payer: Self-pay

## 2016-02-22 NOTE — Telephone Encounter (Signed)
Post ED Visit - Positive Culture Follow-up  Culture report reviewed by antimicrobial stewardship pharmacist:  []  Elenor Quinones, Pharm.D. []  Heide Guile, Pharm.D., BCPS []  Parks Neptune, Pharm.D. []  Alycia Rossetti, Pharm.D., BCPS []  [place], [place].D., BCPS, AAHIVP []  Legrand Como, Pharm.D., BCPS, AAHIVP []  Cassie Stewart, Pharm.D. []  Stephens November, Pharm.D. Dimitri Ped Pharm D Positive urine culture Treated with Cephalexin, organism sensitive to the same and no further patient follow-up is required at this time.  Genia Del 02/22/2016, 9:19 AM

## 2016-02-28 ENCOUNTER — Other Ambulatory Visit: Payer: Self-pay | Admitting: Hematology and Oncology

## 2016-02-28 DIAGNOSIS — C50311 Malignant neoplasm of lower-inner quadrant of right female breast: Secondary | ICD-10-CM

## 2016-03-07 NOTE — Assessment & Plan Note (Signed)
Right breast lumpectomy 06/18/2014: Invasive ductal carcinoma 1.3 cm negative for LVI; DCIS 1 mm from margins, 1 sentinel node negative, grade 3 ER 95%, PR 77%, HER-2 amplified ratio 8.25, Ki-67 43% T1 cN0 M0 stage IA  Treatment summary S/P Adjuvant chemo with Abraxane Herceptin started 07/18/2014 completed 10/03/14, Herceptin maintenance completed 07/03/2015 Status post adjuvant radiation completed 12/17/2014 Started tamoxifen 12/26/2014  Current treatment: Tamoxifen 20 mg daily 12/26/2014  Tamoxifen toxicities:  1. Emotional changes especially crying spells and anxiety spells: Much improved 2. Vaginal yeast infections: Treated with over-the-counter creams with improvement. Denies any hot flashes.  Fatigue: I discussed with her about increasing her physical activity and losing weight.  Obesity: Patient is exercising more often. I instructed her to join the living well class to help her lose weight.  Return to clinic in 1 year

## 2016-03-09 ENCOUNTER — Encounter: Payer: Self-pay | Admitting: Hematology and Oncology

## 2016-03-09 ENCOUNTER — Ambulatory Visit (HOSPITAL_BASED_OUTPATIENT_CLINIC_OR_DEPARTMENT_OTHER): Payer: BLUE CROSS/BLUE SHIELD | Admitting: Hematology and Oncology

## 2016-03-09 DIAGNOSIS — C50311 Malignant neoplasm of lower-inner quadrant of right female breast: Secondary | ICD-10-CM

## 2016-03-09 DIAGNOSIS — Z170 Estrogen receptor positive status [ER+]: Secondary | ICD-10-CM | POA: Diagnosis not present

## 2016-03-09 DIAGNOSIS — R5383 Other fatigue: Secondary | ICD-10-CM

## 2016-03-09 NOTE — Progress Notes (Signed)
Patient Care Team: Albina Billet, MD as PCP - General (Internal Medicine) Albina Billet, MD (Internal Medicine) Nicholas Lose, MD as Consulting Physician (Hematology and Oncology) Excell Seltzer, MD as Consulting Physician (General Surgery) Gery Pray, MD as Consulting Physician (Radiation Oncology) Sylvan Cheese, NP as Nurse Practitioner (Hematology and Oncology)  DIAGNOSIS:  Encounter Diagnosis  Name Primary?  . Malignant neoplasm of lower-inner quadrant of right breast of female, estrogen receptor positive ([place])     SUMMARY OF ONCOLOGIC HISTORY:   Breast cancer of lower-inner quadrant of right female breast ([place])   05/14/2014 Mammogram    Right mammogram: 1.5 cm round mass in right breast at 3:00      05/14/2014 Breast US    Right breast: irregular, hypoechoic mass with indistinct margins at 3:00 mearying 1.1 x 0.8 x 1.3 cm.      05/16/2014 Initial Biopsy    Ultrasound-guided biopsy: Invasive ductal carcinoma with DCIS, grade 1-2, ER/PR positive, Ki-67 43%, HER-2 positive      05/28/2014 Breast MRI    1.4 x 1.2 x 1.0 cm biopsy-proven invasive ductal carcinoma and ductal carcinoma in situ in the lower inner quadrant of the right breast.       05/28/2014 Clinical Stage    Stage IA: T1c N0      06/13/2014 Procedure    Breast/Next: Variant of Unknown Significance in the ATM gene called p.[id number]>T, p.[id number]. Otherwise, no clinically significant mutation at ATM, BARD1, BRCA1, BRCA2, BRIP1, CDH1, CHEK2, MRE11A, MUTYH, NBN, NF1, PALB2, PTEN, RAD50, RAD51C, RAD51D, and TP53      06/18/2014 Surgery    Right breast lumpectomy: Invasive ductal carcinoma 1.3 cm negative for LVI; DCIS 1 mm from margins, 1 sentinel node negative, grade 3 ER 95%, PR 77%, HER-2 amplified ratio 8.25, Ki-67 43%      06/18/2014 Pathologic Stage    Stage IA: T1c N0 M0      07/18/2014 - 10/03/2014 Chemotherapy    Abraxane Herceptin weekly 12 followed by Herceptin maintenance every 3 weeks to complete  one year completed 07/03/2015      10/30/2014 - 12/17/2014 Radiation Therapy    Adjuvant radiation therapy: Right breast 50.4 gray in 28 fractions, lumpectomy cavity boost 12 gray in 6 fractions      02/01/2015 -  Anti-estrogen oral therapy    Tamoxifen 20 mg daily       CHIEF COMPLIANT: Follow-up on tamoxifen therapy  INTERVAL HISTORY: Krystal Kelley is a 45 year old with above-mentioned history of right breast cancer treated with lumpectomy and adjuvant chemotherapy and radiation. She is currently on tamoxifen therapy. She reports that she continues to have emotional mood swings. However there getting slightly better. She recently but spared in a fashion event and was reprepped show her photos today. She denies any breast lumps or nodules. She does have occasional hot flashes.  REVIEW OF SYSTEMS:   Constitutional: Denies fevers, chills or abnormal weight loss Eyes: Denies blurriness of vision Ears, nose, mouth, throat, and face: Denies mucositis or sore throat Respiratory: Denies cough, dyspnea or wheezes Cardiovascular: Denies palpitation, chest discomfort Gastrointestinal:  Denies nausea, heartburn or change in bowel habits Skin: Denies abnormal skin rashes Lymphatics: Denies new lymphadenopathy or easy bruising Neurological:Denies numbness, tingling or new weaknesses Behavioral/Psych: Mood is stable, no new changes  Extremities: No lower extremity edema Breast:  denies any pain or lumps or nodules in either breasts All other systems were reviewed with the patient and are negative.  I have reviewed the  past medical history, past surgical history, social history and family history with the patient and they are unchanged from previous note.  ALLERGIES:  has No Known Allergies.  MEDICATIONS:  Current Outpatient Prescriptions  Medication Sig Dispense Refill  . cephALEXin (KEFLEX) 500 MG capsule Take 1 capsule (500 mg total) by mouth 4 (four) times daily. 20 capsule 0  .  cycloSPORINE (RESTASIS) 0.05 % ophthalmic emulsion Place 1 drop into both eyes 2 (two) times daily.    . furosemide (LASIX) 40 MG tablet TAKE 1 TABLET BY MOUTH DAILY AS NEEDED 30 tablet 0  . hydrochlorothiazide (HYDRODIURIL) 25 MG tablet Take 25 mg by mouth daily.    [name] oxyCODONE (ROXICODONE) 5 MG immediate release tablet Take 1 tablet (5 mg total) by mouth every 8 (eight) hours as needed for severe pain. 21 tablet 0  . potassium chloride SA (K-DUR,KLOR-CON) 20 MEQ tablet Take 1 tablet (20 mEq total) by mouth daily. 5 tablet 0  . tamoxifen (NOLVADEX) 20 MG tablet TAKE 1 TABLET BY MOUTH DAILY 90 tablet 3  . VIBERZI 100 MG TABS TAKE 1 TABLET BY MOUTH TWICE DAILY 60 tablet 5  . Vitamin D, Ergocalciferol, (DRISDOL) 50000 units CAPS capsule Take 50,000 Units by mouth every 7 (seven) days. On Sunday     No current facility-administered medications for this visit.     PHYSICAL EXAMINATION: ECOG PERFORMANCE STATUS: 1 - Symptomatic but completely ambulatory  Vitals:   03/09/16 1036  BP: 139/77  Pulse: 91  Resp: 18  Temp: 98.6 F (37 C)   Filed Weights   03/09/16 1036  Weight: 246 lb 1.6 oz (111.6 kg)    GENERAL:alert, no distress and comfortable SKIN: skin color, texture, turgor are normal, no rashes or significant lesions EYES: normal, Conjunctiva are pink and non-injected, sclera clear OROPHARYNX:no exudate, no erythema and lips, buccal mucosa, and tongue normal  NECK: supple, thyroid normal size, non-tender, without nodularity LYMPH:  no palpable lymphadenopathy in the cervical, axillary or inguinal LUNGS: clear to auscultation and percussion with normal breathing effort HEART: regular rate & rhythm and no murmurs and no lower extremity edema ABDOMEN:abdomen soft, non-tender and normal bowel sounds MUSCULOSKELETAL:no cyanosis of digits and no clubbing  NEURO: alert & oriented x 3 with fluent speech, no focal motor/sensory deficits EXTREMITIES: No lower extremity edema  LABORATORY  DATA:  I have reviewed the data as listed   Chemistry      Component Value Date/Time   NA 137 02/20/2016 1822   NA 142 12/12/2015 1157   K 3.1 (L) 02/20/2016 1822   K 3.9 12/12/2015 1157   CL 103 02/20/2016 1822   CL 102 06/11/2013 0408   CO2 25 02/20/2016 1822   CO2 30 (H) 12/12/2015 1157   BUN 8 02/20/2016 1822   BUN 13.8 12/12/2015 1157   CREATININE 0.99 02/20/2016 1822   CREATININE 1.1 12/12/2015 1157      Component Value Date/Time   CALCIUM 8.4 (L) 02/20/2016 1822   CALCIUM 9.6 12/12/2015 1157   ALKPHOS 52 02/20/2016 1822   ALKPHOS 59 12/12/2015 1157   AST 43 (H) 02/20/2016 1822   AST 27 12/12/2015 1157   ALT 43 02/20/2016 1822   ALT 26 12/12/2015 1157   BILITOT 0.6 02/20/2016 1822   BILITOT 0.42 12/12/2015 1157       Lab Results  Component Value Date   WBC 14.2 (H) 02/20/2016   HGB 11.9 (L) 02/20/2016   HCT 35.2 (L) 02/20/2016   MCV 92.4 02/20/2016  PLT 279 02/20/2016   NEUTROABS 11.1 (H) 02/20/2016     ASSESSMENT & PLAN:  Breast cancer of lower-inner quadrant of right female breast Right breast lumpectomy 06/18/2014: Invasive ductal carcinoma 1.3 cm negative for LVI; DCIS 1 mm from margins, 1 sentinel node negative, grade 3 ER 95%, PR 77%, HER-2 amplified ratio 8.25, Ki-67 43% T1 cN0 M0 stage IA  Treatment summary S/P Adjuvant chemo with Abraxane Herceptin started 07/18/2014 completed 10/03/14, Herceptin maintenance completed 07/03/2015 Status post adjuvant radiation completed 12/17/2014 Started tamoxifen 12/26/2014  Current treatment: Tamoxifen 20 mg daily 12/26/2014  Tamoxifen toxicities:  1. Emotional changes especially crying spells and anxiety spells: Much improved 2. Vaginal yeast infections: Treated with over-the-counter creams with improvement. Denies any hot flashes.  Fatigue: I discussed with her about increasing her physical activity and losing weight.  Obesity: Patient is exercising more often. I instructed her to join the living  well class to help her lose weight.  Return to clinic in 6 months.   No orders of the defined types were placed in this encounter.  The patient has a good understanding of the overall plan. she agrees with it. she will call with any problems that may develop before the next visit here.   Rulon Eisenmenger, MD 03/09/16

## 2016-03-22 ENCOUNTER — Ambulatory Visit: Payer: BLUE CROSS/BLUE SHIELD | Admitting: Podiatry

## 2016-03-28 ENCOUNTER — Other Ambulatory Visit: Payer: Self-pay | Admitting: Hematology and Oncology

## 2016-03-28 DIAGNOSIS — C50311 Malignant neoplasm of lower-inner quadrant of right female breast: Secondary | ICD-10-CM

## 2016-04-21 ENCOUNTER — Ambulatory Visit (INDEPENDENT_AMBULATORY_CARE_PROVIDER_SITE_OTHER): Payer: BLUE CROSS/BLUE SHIELD | Admitting: Podiatry

## 2016-04-21 DIAGNOSIS — T847XXA Infection and inflammatory reaction due to other internal orthopedic prosthetic devices, implants and grafts, initial encounter: Secondary | ICD-10-CM

## 2016-04-21 DIAGNOSIS — M722 Plantar fascial fibromatosis: Secondary | ICD-10-CM

## 2016-04-21 DIAGNOSIS — M21621 Bunionette of right foot: Secondary | ICD-10-CM | POA: Diagnosis not present

## 2016-04-21 NOTE — Patient Instructions (Signed)

## 2016-04-21 NOTE — Progress Notes (Signed)
She presents today complaining of painful internal fixation fifth metatarsal of the left foot and would like to have her right foot corrected.  Objective: Vital signs are stable she is alert and oriented 3 pulses are palpable. I have reviewed her past mental history medications allergy surgeries and social history. She has pain on palpation of fifth metatarsal head of the left foot which retains a screw that is prominent from the fifth metatarsal osteotomy she had in the past to reduce a tailor's bunion. This is tender on palpation but has not ulcerated. Pulses remain palpable bilateral. She does have a increase in the fourth intermetatarsal angle resulting in a tailor's bunion deformity of the right foot which painful palpation and range of motion of the toe. Prominent reactive hyperkeratosis of fifth metatarsal head of the right foot.  Assessment: Resolving plantar fasciitis painful internal fixation fifth metatarsal left foot. Tailor's bunion deformity right foot.  Plan: We discussed etiology and pathology conservative versus surgical therapies at this point I would highly recommend a fifth metatarsal osteotomy to the right foot and removal of internal fixation fifth metatarsal left foot. We would've the consent form today lamellae number number giving her appetite S/E [place] regarding these procedures I answered them to the best of my ability in layman's terms. She understood this was amenable to it and signed all 3 phases of the consent form. She understands that she will have general anesthesia with local anesthetic. I'll follow-up with her at the time surgery.

## 2016-04-30 ENCOUNTER — Telehealth: Payer: Self-pay | Admitting: Emergency Medicine

## 2016-04-30 NOTE — Telephone Encounter (Signed)
Patient called with concerns of "having the blues". Patient states she stays in her room a lot. States that she has been trying to be strong but seems to be very emotional now. States she cries easily now. Patient asking for information on support classes. Patient denies any suicidal or homicidal ideations.   Will notify Dr Lindi Adie of these findings and also Social work.

## 2016-05-02 ENCOUNTER — Other Ambulatory Visit: Payer: Self-pay | Admitting: Hematology and Oncology

## 2016-05-02 DIAGNOSIS — C50311 Malignant neoplasm of lower-inner quadrant of right female breast: Secondary | ICD-10-CM

## 2016-05-04 ENCOUNTER — Encounter: Payer: Self-pay | Admitting: *Deleted

## 2016-05-04 NOTE — Progress Notes (Signed)
[place] Work  Clinical Social Work was referred by Therapist, sports for assessment of psychosocial needs.  Clinical Social Worker contacted patient at home to offer support and assess for needs.  Patient stated she is experiencing increased depressed feelings and is interested in support services.  CSW provided space for patient to process her feelings.  CSW provided support and validated patients feelings and concerns.  CSW and patient discussed support services at [facility].  Patient was agreeable to a referral to the [facility] counseling intern and the [facility] program.  Patient also expressed interest in the breast cancer support group and plans to attend this months meeting.  CSW provided contact information and encouraged patient to call with any needs of concerns.    Johnnye Lana, MSW, LCSW, OSW-C Clinical Social Worker [facility] [phone]

## 2016-05-10 NOTE — Progress Notes (Signed)
Patient was referred to counseling intern by Education officer, museum. Counselor also met patient at breat cancer support group meeting on 05/04/2016. Counselor called patient and set up initial counseling session for 05/11/16 at 11:30 am.  Lamount Cohen, Counseling Intern Department for [place] and [place]
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
## 2016-05-18 NOTE — Progress Notes (Signed)
Counseling intern and patient met at 11:40 am for session 2. Patient was alert and oriented and engaged clearly with counselor throughout session. Session began with the Outcome Rating Scale (an assessment of individuals' well-being over the past week), on which patient scored a total of 23.1 out of 40. Patient scored a total of 11.7 at the previous session. Patient denied any suicidal ideation since the previous session. She reported that she notified the people listed on the Safety Plan created during the previous session in response to her report of S/I one year ago. Patient stated it took courage for her to make the contacts and to explain the reason for it but that she felt supported. She reported that she reached out to a friend on the Safety Plan list for support during a period of feeling overwhelmed but without suicidal ideation.   Patient reported that she has become more mindful of the triggers that lead to changes in her mood and that she is now more likely to ask, "Why is this happening?". Patient noted that feeling a loss of control and unexpected mishaps (eg screw in a tire) tend to lead to her feeling anxious and overwhelmed, more so since undergoing cancer treatment.  Near the end of session, patient asked counselor if she noted any progress. Counselor reviewed and affirmed the changes patient that she had noted within herself.  Next session scheduled for 05/25/2016 at 11:30 am. Patient suggested she is ready to talk about what has been happening for her since the cancer diagnosis, including the feeling of a loss of control. Counselor asked patient to continue thinking about who she perceives herself to be and who she would like to be going forward.   Lamount Cohen, Counseling Intern Supervisor - Scientist, research (physical sciences)

## 2016-05-25 NOTE — Progress Notes (Signed)
05/25/2016 11:45 am counseling session. Patient arrived approximately 15 minutes later than the scheduled time of 11:30. She appeared alert and oriented to time, person, and place, was neatly dressed and groomed, and engaged clearly with counseling intern throughout session. Patient noted, as in previous sessions, feeling tired due to falling asleep after midnight and not getting restful sleep due to having to get up early.  Patient began session describing her process of completing her "homework", which was to describe how she perceives herself now and the kind of person she wants to become. She noted that if was somewhat difficult to think of how to describe herself. Counselor offered that this is to be an ongoing "assigment" and encouraged patient to respond to it by describing herself in the ways that seem most appropriateto her.  Patient described in detail the fulfillment she receives from giving to and supporting others and discussed plans to support a breast cancer awareness fundraiser that she was involved with at the end of last year. Patient discussed how giving anonymously brings her joy and that participating in the fundraiser gives her something to do that takes her out of the isolation of sitting in her room.  Patient recalled her struggles and questions when, during her treatment, she encountered patients who appeared to be in poorer health or were in more advanced cancer stages than she was. One outcome of her questioning was that she felt led to encourage others.  Patient and counselor discussed patient's progress since she has been attending counseling sessions and support group meetings. Patient indicated that it takes less time for her to recognize and address changes in her mood. Counselor noted the progressive change in her scores on the Outcome Rating Scale.   Counselor assessed for suicidal ideation, as patient had reported in the first session that she attempted suicide at age 46 or  84 and had experienced S/I last year. She denied current S/I and rated 1 (not likely at all) out of 10 (definitely) on the likelihood of committing suicide. Patient denied having a plan or a means of carrying out a plan. Patient stated that she has not struggled with substance abuse. She reported no family history of attempted or completed suicide. Patient scored 29.1 out of 40 on the Outcome Rating Scale, a measure of patients'  emotional well-being over the previous week, and she stated, "it's been a good week." She stated that she had not had to call any of the people listed on the Client Safety Plan for emotional support.  Next session scheduled for 06/01/2016 at 11:30 am. Patient and counselor will discuss patient's concerns about her current drive to be more involved in fund-raising activities and her fears of whether she will return to isolation and inactivity once the events and activities are completed.  Lamount Cohen, Counseling Intern Department for Spiritual Care Supervisor - [place]

## 2016-05-30 ENCOUNTER — Other Ambulatory Visit: Payer: Self-pay | Admitting: Hematology and Oncology

## 2016-05-30 DIAGNOSIS — C50311 Malignant neoplasm of lower-inner quadrant of right female breast: Secondary | ICD-10-CM

## 2016-06-02 ENCOUNTER — Other Ambulatory Visit: Payer: Self-pay | Admitting: *Deleted

## 2016-06-03 NOTE — Progress Notes (Signed)
06/03/2016 Counseling intern met with patient for ongoing counseling support. Client arrived approximately 10 minutes late for the scheduled 11:30 am appointment. She appeared alert, oriented x3, and well-groomed as usual. She engaged clearly with counselor throughout session. Patient began session stating the week had been "OK" and scored 20.3 out of 40 on the Outcome Rating Scale (ORS).   Patient indicated she had continued to complete her "homework" from the 2nd to last session, when she was asked to reflect on who she perceives herself to be, who she is not, and whom she would like to become. Patient noted that she had thought about past mistakes and that she continued to feel she has not accomplished anything in her life,especially compared with her classmates. Patient and counselor spent the session reflecting on patient's history of relationships and how events from the past affect how she currently relates to potential romantic partners.   Patient stated that she felt vunerable while talking about her past during this session. Counselor affirmed her willingness to discuss difficult topics with a "stranger", especially given patient's pattern of keeping painful thoughts and emotions to herself.   Lamount Cohen, Counseling Intern Supervisor - Scientist, research (physical sciences)
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
## 2016-06-08 NOTE — Progress Notes (Signed)
Called pt to clarify some questions from recent long term disability form that she had submitted. Told pt that there was mental health and behavioral health questions that Dr.Gudena does not evaluate for. Pt states that she is only requesting for medical records and test results since beginning of Jan 2017 to present. No need to fill out the behavioral questionnaire part. Will contact HIM to have them fax over notes and documentation to [facility].

## 2016-06-11 ENCOUNTER — Encounter: Payer: Self-pay | Admitting: Radiation Oncology

## 2016-06-11 NOTE — Progress Notes (Signed)
Paperwork ([employer]) received 06/11/16, given to nurse

## 2016-06-15 ENCOUNTER — Other Ambulatory Visit: Payer: Self-pay

## 2016-06-15 ENCOUNTER — Telehealth: Payer: Self-pay | Admitting: Hematology and Oncology

## 2016-06-15 DIAGNOSIS — C50311 Malignant neoplasm of lower-inner quadrant of right female breast: Secondary | ICD-10-CM

## 2016-06-15 DIAGNOSIS — Z170 Estrogen receptor positive status [ER+]: Principal | ICD-10-CM

## 2016-06-15 DIAGNOSIS — F329 Major depressive disorder, single episode, unspecified: Secondary | ICD-10-CM

## 2016-06-15 DIAGNOSIS — F32A Depression, unspecified: Secondary | ICD-10-CM

## 2016-06-15 NOTE — Telephone Encounter (Signed)
Received Long-Term Disability paperwork for Behavioral Health Questionnaire to be filled out. Per prior conversation with May S. Dr Geralyn Flash nurse, pt advised filling out the questionnaire wasn't necessary but per [facility], it is. Gave new paperwork to May S. Who said that pt will need to have a Victor done by an outside physician so she will speak to patient and have Dr. Lindi Adie send a referral for this

## 2016-06-15 NOTE — Progress Notes (Signed)
Called pt back to let her know that [employer] called back, per Tiffany (medical record) to confirm that they have received all pertinent medical records that they've requested. Christella Scheuermann states that pt will need a behavioral questionnaire filled out before they can process her disability claim. Pt will need to be referred to behavioral health for an evaluation to fill out the claim form. Pt verbalized understanding and was confused because she states that "Christella Scheuermann said that they just need medical records sent and not to worry about the behavioral question part". Apologized for the confusion, but advised pt to call [employer] to clarify requirements for disability form. Pt states that she will call back with updates after she speaks to [place].

## 2016-06-16 ENCOUNTER — Other Ambulatory Visit: Payer: Self-pay

## 2016-06-16 DIAGNOSIS — Z170 Estrogen receptor positive status [ER+]: Principal | ICD-10-CM

## 2016-06-16 DIAGNOSIS — C50311 Malignant neoplasm of lower-inner quadrant of right female breast: Secondary | ICD-10-CM

## 2016-06-16 NOTE — Progress Notes (Signed)
Counseling intern met with patient on 06/15/2016 at approximately 12:00 pm for counseling session. Patient was alert, oriented x3, and well-groomed as usual. She engaged clearly and insightfully with counselor throughout session. Patient scored 25.4/40 on the Outcome Rating Scale and noted that she had had an uneventful week.   Patient delved deeper into the source of her fear of appearing weak or vulnerable and the need to appear confident and competent at all times. She recounted her reactions to a conversation with a support group member around letting go of pride. Patient also described her spirituality in relation to demonstrating strength, noting the importance of both acknowledging feeling broken on the inside and needing to appear strong on the outside. Counselor affirmed this dichotomy in light of patient's history and as a theme among black women  Next session scheduled for 06/22/2016. Counselor asked patient to consider what "weak" means to her with the goal of processing in next session how patient might think differently about what appearing weak looks like.  Lamount Cohen, Counseling Intern Department for Spiritual Care and [facility] Supervisor - Scientist, research (physical sciences)

## 2016-06-22 ENCOUNTER — Other Ambulatory Visit: Payer: Self-pay | Admitting: Hematology and Oncology

## 2016-06-22 DIAGNOSIS — C50311 Malignant neoplasm of lower-inner quadrant of right female breast: Secondary | ICD-10-CM

## 2016-06-22 NOTE — Progress Notes (Signed)
06/22/2016 Patient called to canceled scheduled session due to feeling ill. Patient reported on her week since the last session and described a particular emotionally distressing event and her coping strategies. Counselor affirmed patient's use of coping skills, including those she has acquired since seeking counseling support.  Session was rescheduled for 06/29/2016. Counselor will check in with patient on her "homework" progress regarding her self-perceptions.  Lamount Cohen, Counseling Intern Supervisor - Scientist, research (physical sciences)

## 2016-06-23 ENCOUNTER — Ambulatory Visit (INDEPENDENT_AMBULATORY_CARE_PROVIDER_SITE_OTHER): Payer: BLUE CROSS/BLUE SHIELD | Admitting: Podiatry

## 2016-06-23 ENCOUNTER — Encounter: Payer: Self-pay | Admitting: Podiatry

## 2016-06-23 ENCOUNTER — Other Ambulatory Visit: Payer: Self-pay

## 2016-06-23 ENCOUNTER — Ambulatory Visit (INDEPENDENT_AMBULATORY_CARE_PROVIDER_SITE_OTHER): Payer: BLUE CROSS/BLUE SHIELD

## 2016-06-23 DIAGNOSIS — S93601A Unspecified sprain of right foot, initial encounter: Secondary | ICD-10-CM | POA: Diagnosis not present

## 2016-06-23 NOTE — Progress Notes (Signed)
She presents today with a chief complaint of pain to the dorsal lateral aspect of the right foot. States this been swollen for over a month denies any trauma. States that it starts out as pins and needles and progresses to pain in this area and she points around the fourth fifth metatarsocuboid articulation. She relates that the plantar fasciitis in this area has subsided she states that she still has some mild tailor's bunion problems.  Objective: Vital signs are stable alert and oriented 3. Pulses are palpable. No open lesions or wounds are noted. She has pain on palpation and range of motion of the fourth fifth metatarsocuboid articulation and on palpation to this area. Radiographs do not demonstrate a major osseous abnormality in this area.  Assessment: Pain limb secondary to sprain of the dorsal lateral right foot.  Plan: Suggested that she wear appropriate shoe gear and follow up with me in 6 weeks.

## 2016-06-29 ENCOUNTER — Other Ambulatory Visit: Payer: Self-pay | Admitting: Gastroenterology

## 2016-06-29 NOTE — Progress Notes (Signed)
06/29/2016 Counseling intern met with patient, who appeared alert, oriented, and well-groomed and engaged clearly and insightfully with counselor throughout session. Session focused on patient's ideas about weakness and how this relates to her sense of self and appearing vulnerable. Patient discussed the dissonance between her ability to perceive when others need support and encouragement and the fact that she does not receive the same level of support from others without having to ask for it. Next session will continue to explore patient's dissonance around giving and receiving support.  Lamount Cohen, Counseling Intern Supervisor - Scientist, research (physical sciences)

## 2016-07-05 ENCOUNTER — Other Ambulatory Visit: Payer: Self-pay

## 2016-07-05 MED ORDER — ELUXADOLINE 100 MG PO TABS: 1.0000 | ORAL_TABLET | Freq: Two times a day (BID) | ORAL | 5 refills | Status: DC

## 2016-07-06 ENCOUNTER — Other Ambulatory Visit: Payer: Self-pay

## 2016-07-06 DIAGNOSIS — C50311 Malignant neoplasm of lower-inner quadrant of right female breast: Secondary | ICD-10-CM

## 2016-07-06 MED ORDER — FUROSEMIDE 40 MG PO TABS: 40.0000 mg | ORAL_TABLET | Freq: Every day | ORAL | 6 refills | Status: DC | PRN

## 2016-07-06 NOTE — Progress Notes (Signed)
07/06/2016 Counseling intern met with patient for session. Patient was alert, oriented x3, neatly groomed, and engaged clearly with counselor throughout session.  Patient scored 27.9 out of 40 on the Outcome Rating Scale and reported that the previous week had been good. She described celebrating with family and friends her one-year post-treatment anniversary. Patient also described experiencing a series of mishaps that led to her feeling overwhelmed. Patient reported having thoughts in which she questioned if she would be missed if she went away. She denied having suicidal ideation and stated the thoughts were more about feeling that her efforts are not valued or appreciated as well as wanting to escape the overwhelming feeling brought on by the mishaps. Patient discussed how not wanting to appear weak contributed to her distress.  Patient continues to be aware of the connection between her thoughts and her emotions. She was also deeply affected by making a connection between working hard to appear strong and experiencing symptoms of anxiety and depression, and she expressed being open to learning strategies to shift her thinking around what it means to be strong.  Next session scheduled for 07/13/2016. Counselor will introduce the concepts behind cognitive restructuring to help patient alter thinking patterns that contribute to anxiety and depression.  Lamount Cohen, Counseling Intern Department for Spiritual Care and Wholeness Supervisor - Chaplain Jerene Pitch

## 2016-07-07 ENCOUNTER — Ambulatory Visit (INDEPENDENT_AMBULATORY_CARE_PROVIDER_SITE_OTHER): Payer: BLUE CROSS/BLUE SHIELD | Admitting: Orthopedic Surgery

## 2016-07-14 NOTE — Progress Notes (Signed)
07/13/2016 Counseling intern met with patient for session. Client appeared alert, oriented x3, and neatly groomed. She engaged clearly and thoughtfully throughout session. Patient described an incidence in which she questioned her ability to make good decisions and her subsequent negative self-talk.   Counselor introduced the ABC thought diary to help patient identify her beliefs and emotional responses to the activating event she described. Counselor also affirmed patient's emotional response to the life stressors that impacted and exacerbated the activating event and acknowledged that her current medication also has emotional and physical impacts.  Next session scheduled for 07/20/2016. Counselor will continue with the ABC thought diary activity, provide psycho-education around types of thinking errors, and further explore how patient's sense of self-worth contributes to how she responds when her behavior does not meet her standards/expectations.  Janne Faulk A. Philbert Riser, Counseling Intern Department for Spiritual Care Supervisor - Chaplain Jerene Pitch

## 2016-07-22 NOTE — Progress Notes (Signed)
07/20/2016 Counseling intern and patient met for session. Patient appeared alert, oriented x3, and neatly groomed and engaged clearly and thoughtfully with counselor throughout session. Patient scored 29.6/40 on the Outcome Rating Scale and reported that she had had a good week. Session focused on patient's report of having worked in her yard for 8 hours one day that left her feeling extremely tired and sore for several days afterward. Counselor continued using the thought diary from the previous session to explore patient's thoughts about the need to push herself to the extreme to accomplish tasks and the importance of orderliness and maintaining a pleasant outward appearance. Patient described her thoughts and emotions around not allowing the cancer diagnosis to make her feel that her physical abilities are limited.  Next session scheduled for 07/27/2016. Counselor will  Continue using the thought diary to help patient examine thought patterns including her thoughts about feeling physically limited. Counselor will explore possible alternative thought patterns that are congruent with patient's values and strengths but that are also less extreme and that are less likely to result in emotional and/or physical distress.  Candy Leverett A. Philbert Riser, Counseling Intern Department for [place] and [employer] Supervisor - Chaplain Jerene Pitch

## 2016-07-28 NOTE — Progress Notes (Signed)
07/27/2016 Counseling intern met with patient for session. Patient arrived approximately 20 minutes later than usual and reported having overslept. She appeared alert, oriented x3, and neatly groomed, and she engaged clearly and thoughtfully with counselor throughout session. Patient reported it has been an "alright" week and scored 30.3/40 on the Outcome Rating Scale.   Counselor continued to explore with patient the role of perfectionistic thinking and not quitting and when they result in symptoms of anxiety and depression.  Counselor drew parallels between a recent instance in which patient quickly forgave someone for making a costly mistake that directly affected the patient and another incident from weeks ago in which patient made a mistake but for which she still has not forgiven herself, noting the discrepancy between patient's standards for herself and for others.   Counselor affirmed patient's willingness to express when she does feel hurt or disappointment while also intentionally choosing when and how to effectively confront the people who have hurt her.   In next session (08/03/2016), counselor will help client explore possible self-talk that honors the standards and values she has adopted in order to live successfully but that is less self-critical.  Katriana Dortch A. Philbert Riser, Counseling Intern Department for [place] and [facility] Supervisor - [place], [place]

## 2016-08-04 ENCOUNTER — Ambulatory Visit: Payer: BLUE CROSS/BLUE SHIELD | Admitting: Podiatry

## 2016-08-04 NOTE — Progress Notes (Signed)
08/03/2016 Counselor met with patient for session. Patient was neatly groomed and was oriented x3. She engaged clearly with counselor throughout session. She stated that she had had a good week with one exception and scored 22.5/40 on the Outcome Rating Scale. A great deal of the session was spent processing an incident in which patient made a public comment about her journey with cancer and her perception that a close friend had been critical and judgmental regarding the comment. Counselor offered validation around patient's reactions. Throughout sessions, patient has described her difficulty with telling others about her struggles and worries about appearing weak is she asked for support, so making a public statement about her progress was significant. Counselor also affirmed that patient was negatively impacted by her friend's seemingly unsupportive response.   Counselor noted that we would have one more session and offered to provide patient with a list of area therapists so that she might continue with counseling. Counselor also encouraged her to continue participating in support groups.  Next and final session scheduled for 08/10/2016.  Counselor will present referral list to patient and process with patient what she will take from this experience.  Lamount Cohen, Counseling Intern Department for [facility] and [facility] Supervisor - [place], [place]

## 2016-08-05 ENCOUNTER — Encounter (INDEPENDENT_AMBULATORY_CARE_PROVIDER_SITE_OTHER): Payer: Self-pay

## 2016-08-05 ENCOUNTER — Ambulatory Visit (INDEPENDENT_AMBULATORY_CARE_PROVIDER_SITE_OTHER): Payer: BLUE CROSS/BLUE SHIELD | Admitting: Psychiatry

## 2016-08-05 ENCOUNTER — Encounter (HOSPITAL_COMMUNITY): Payer: Self-pay | Admitting: Psychiatry

## 2016-08-05 VITALS — BP 136/80 | HR 67 | Ht 65.0 in | Wt 243.6 lb

## 2016-08-05 DIAGNOSIS — F4323 Adjustment disorder with mixed anxiety and depressed mood: Secondary | ICD-10-CM | POA: Diagnosis not present

## 2016-08-05 DIAGNOSIS — Z79899 Other long term (current) drug therapy: Secondary | ICD-10-CM | POA: Diagnosis not present

## 2016-08-05 DIAGNOSIS — F4312 Post-traumatic stress disorder, chronic: Secondary | ICD-10-CM

## 2016-08-05 MED ORDER — HYDROXYZINE PAMOATE 25 MG PO CAPS: 25.0000 mg | ORAL_CAPSULE | Freq: Every evening | ORAL | 1 refills | Status: DC | PRN
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
## 2016-08-09 ENCOUNTER — Other Ambulatory Visit: Payer: Self-pay | Admitting: Orthopedic Surgery

## 2016-08-09 ENCOUNTER — Ambulatory Visit (HOSPITAL_COMMUNITY): Payer: Self-pay | Admitting: Clinical

## 2016-08-09 ENCOUNTER — Encounter: Payer: Self-pay | Admitting: Adult Health

## 2016-08-09 ENCOUNTER — Ambulatory Visit (HOSPITAL_BASED_OUTPATIENT_CLINIC_OR_DEPARTMENT_OTHER): Payer: BLUE CROSS/BLUE SHIELD | Admitting: Adult Health

## 2016-08-09 VITALS — BP 129/79 | HR 88 | Temp 98.5°F | Resp 20

## 2016-08-09 DIAGNOSIS — N6310 Unspecified lump in the right breast, unspecified quadrant: Secondary | ICD-10-CM | POA: Diagnosis not present

## 2016-08-09 DIAGNOSIS — Z170 Estrogen receptor positive status [ER+]: Secondary | ICD-10-CM

## 2016-08-09 DIAGNOSIS — Z79810 Long term (current) use of selective estrogen receptor modulators (SERMs): Secondary | ICD-10-CM | POA: Diagnosis not present

## 2016-08-09 DIAGNOSIS — C50311 Malignant neoplasm of lower-inner quadrant of right female breast: Secondary | ICD-10-CM | POA: Diagnosis not present

## 2016-08-09 NOTE — Progress Notes (Signed)
Patient Care Team: Albina Billet, MD as PCP - General (Internal Medicine) Albina Billet, MD (Internal Medicine) Nicholas Lose, MD as Consulting Physician (Hematology and Oncology) Excell Seltzer, MD as Consulting Physician (General Surgery) Gery Pray, MD as Consulting Physician (Radiation Oncology) Gardenia Phlegm, NP as Nurse Practitioner (Hematology and Oncology)  DIAGNOSIS:  Encounter Diagnoses  Name Primary?  . Malignant neoplasm of lower-inner quadrant of right breast of female, estrogen receptor positive ([place]) Yes  . Breast mass, right     SUMMARY OF ONCOLOGIC HISTORY:   Breast cancer of lower-inner quadrant of right female breast ([place])   05/14/2014 Mammogram    Right mammogram: 1.5 cm round mass in right breast at 3:00      05/14/2014 Breast US    Right breast: irregular, hypoechoic mass with indistinct margins at 3:00 mearying 1.1 x 0.8 x 1.3 cm.      05/16/2014 Initial Biopsy    Ultrasound-guided biopsy: Invasive ductal carcinoma with DCIS, grade 1-2, ER/PR positive, Ki-67 43%, HER-2 positive      05/28/2014 Breast MRI    1.4 x 1.2 x 1.0 cm biopsy-proven invasive ductal carcinoma and ductal carcinoma in situ in the lower inner quadrant of the right breast.       05/28/2014 Clinical Stage    Stage IA: T1c N0      06/13/2014 Procedure    Breast/Next: Variant of Unknown Significance in the ATM gene called p.[id number]>T, p.[id number]. Otherwise, no clinically significant mutation at ATM, BARD1, BRCA1, BRCA2, BRIP1, CDH1, CHEK2, MRE11A, MUTYH, NBN, NF1, PALB2, PTEN, RAD50, RAD51C, RAD51D, and TP53      06/18/2014 Surgery    Right breast lumpectomy: Invasive ductal carcinoma 1.3 cm negative for LVI; DCIS 1 mm from margins, 1 sentinel node negative, grade 3 ER 95%, PR 77%, HER-2 amplified ratio 8.25, Ki-67 43%      06/18/2014 Pathologic Stage    Stage IA: T1c N0 M0      07/18/2014 - 10/03/2014 Chemotherapy    Abraxane Herceptin weekly 12 followed by Herceptin  maintenance every 3 weeks to complete one year completed 07/03/2015      10/30/2014 - 12/17/2014 Radiation Therapy    Adjuvant radiation therapy: Right breast 50.4 gray in 28 fractions, lumpectomy cavity boost 12 gray in 6 fractions      02/01/2015 -  Anti-estrogen oral therapy    Tamoxifen 20 mg daily       CHIEF COMPLIANT: breast concerns  INTERVAL HISTORY: Krystal Kelley is a 46 year old with history of right breast cancer treated with lumpectomy and adjuvant chemotherapy and radiation. She is currently on tamoxifen therapy.  She is currently having right breast concerns that started this past Friday.  She felt "something" in her right breast that was not normal.  She has not noted any skin changes, redness, pain, nipple changes, swelling in the right arm, or any further concerns.  Her last mammogram was March, 2018 at [facility] and was normal with diagnostic mammogram recommended in one year.    REVIEW OF SYSTEMS:   Review of Systems  Constitutional: Negative for chills, diaphoresis, fever, malaise/fatigue and weight loss.  HENT: Negative for hearing loss and tinnitus.   Eyes: Negative for blurred vision and double vision.  Respiratory: Negative for cough and shortness of breath.   Cardiovascular: Negative for chest pain, palpitations and leg swelling.  Gastrointestinal: Negative for abdominal pain, blood in stool, constipation, diarrhea, heartburn, nausea and vomiting.  Musculoskeletal: Positive for joint pain.  Skin: Negative for  rash.  Neurological: Negative for dizziness, focal weakness, weakness and headaches.  Endo/Heme/Allergies: Negative for environmental allergies. Does not bruise/bleed easily.   I have reviewed the past medical history, past surgical history, social history and family history with the patient and they are unchanged from previous note.  ALLERGIES:  has No Known Allergies.  MEDICATIONS:  Current Outpatient Prescriptions  Medication Sig Dispense Refill  .  BELVIQ 10 MG TABS Take 100 mg by mouth 2 (two) times daily.  1  . cycloSPORINE (RESTASIS) 0.05 % ophthalmic emulsion Place 1 drop into both eyes 2 (two) times daily.    . Eluxadoline (VIBERZI) 100 MG TABS Take 1 tablet by mouth 2 (two) times daily. 60 tablet 5  . furosemide (LASIX) 40 MG tablet Take 1 tablet (40 mg total) by mouth daily as needed. 30 tablet 6  . hydrOXYzine (VISTARIL) 25 MG capsule Take 1 capsule (25 mg total) by mouth at bedtime as needed (sleep). 30 capsule 1  . tamoxifen (NOLVADEX) 20 MG tablet TAKE 1 TABLET BY MOUTH DAILY 90 tablet 3   No current facility-administered medications for this visit.     PHYSICAL EXAMINATION: ECOG PERFORMANCE STATUS: 1 - Symptomatic but completely ambulatory  Vitals:   08/09/16 0945  BP: 129/79  Pulse: 88  Resp: 20  Temp: 98.5 F (36.9 C)   There were no vitals filed for this visit.  GENERAL: Patient is a well appearing female in no acute distress HEENT:  Sclerae anicteric.  Oropharynx clear and moist. No ulcerations or evidence of oropharyngeal candidiasis. Neck is supple.  NODES:  No cervical, supraclavicular, or axillary lymphadenopathy palpated.  BREAST EXAM:  Right upper outer quadrant breast with skin thickening about 1cm across, asymmetric, not well circumscribed, at 1100 oclock, about 9-10 cm from the nipple, left breast without nodules, masses or skin/nipple changes LUNGS:  Clear to auscultation bilaterally.  No wheezes or rhonchi. HEART:  Regular rate and rhythm. No murmur appreciated. ABDOMEN:  Soft, nontender.  Positive, normoactive bowel sounds. No organomegaly palpated. MSK:  No focal spinal tenderness to palpation. Full range of motion bilaterally in the upper extremities. EXTREMITIES:  No peripheral edema.   SKIN:  Clear with no obvious rashes or skin changes. No nail dyscrasia. NEURO:  Nonfocal. Well oriented.  Appropriate affect.    LABORATORY DATA:  I have reviewed the data as listed   Chemistry        Component Value Date/Time   NA 137 02/20/2016 1822   NA 142 12/12/2015 1157   K 3.1 (L) 02/20/2016 1822   K 3.9 12/12/2015 1157   CL 103 02/20/2016 1822   CL 102 06/11/2013 0408   CO2 25 02/20/2016 1822   CO2 30 (H) 12/12/2015 1157   BUN 8 02/20/2016 1822   BUN 13.8 12/12/2015 1157   CREATININE 0.99 02/20/2016 1822   CREATININE 1.1 12/12/2015 1157      Component Value Date/Time   CALCIUM 8.4 (L) 02/20/2016 1822   CALCIUM 9.6 12/12/2015 1157   ALKPHOS 52 02/20/2016 1822   ALKPHOS 59 12/12/2015 1157   AST 43 (H) 02/20/2016 1822   AST 27 12/12/2015 1157   ALT 43 02/20/2016 1822   ALT 26 12/12/2015 1157   BILITOT 0.6 02/20/2016 1822   BILITOT 0.42 12/12/2015 1157       Lab Results  Component Value Date   WBC 14.2 (H) 02/20/2016   HGB 11.9 (L) 02/20/2016   HCT 35.2 (L) 02/20/2016   MCV 92.4 02/20/2016   PLT  279 02/20/2016   NEUTROABS 11.1 (H) 02/20/2016     ASSESSMENT & PLAN:  Breast cancer of lower-inner quadrant of right female breast Right breast lumpectomy 06/18/2014: Invasive ductal carcinoma 1.3 cm negative for LVI; DCIS 1 mm from margins, 1 sentinel node negative, grade 3 ER 95%, PR 77%, HER-2 amplified ratio 8.25, Ki-67 43% T1 cN0 M0 stage IA  Treatment summary S/P Adjuvant chemo with Abraxane Herceptin started 07/18/2014 completed 10/03/14, Herceptin maintenance completed 07/03/2015 Status post adjuvant radiation completed 12/17/2014 Started tamoxifen 12/26/2014  Current treatment: Tamoxifen 20 mg daily 12/26/2014  Plan: right breast abnormality, will get diagnostic mammo and ultrasound from [facility].  I reassured patient as could ? Be radiation changes in breast.  She will have this done tomorrow.  We will wait on results.  She has f/u with Dr. Lindi Adie in May, 2018.  Offered patient reassurance.    A total of (30) minutes of face-to-face time was spent with this patient with greater than 50% of that time in counseling and care-coordination.   The patient  has a good understanding of the overall plan. she agrees with it. she will call with any problems that may develop before the next visit here.   Scot Dock, NP 08/09/16

## 2016-08-10 ENCOUNTER — Encounter: Payer: Self-pay | Admitting: Hematology and Oncology

## 2016-08-10 NOTE — Progress Notes (Signed)
08/10/2016 patient and counseling intern met for session. Patient appeared alert, oriented x3, and neatly groomed. She scored 8.0/40 on the Outcome Rating Scale and reported experiencing anxiety around feeling "something" in her breast. Counselor and patient processed her emotional response and patient's reliance on her faith as a source of support. Patient reported receiving a referral for further therapeutic support. Counselor and patient processed her takeaways from this experience.  Phylis Javed A. Philbert Riser, Counseling Intern Department for [place] and [facility] Supervisor - [place], [place]

## 2016-08-11 ENCOUNTER — Ambulatory Visit: Payer: BLUE CROSS/BLUE SHIELD | Admitting: Podiatry

## 2016-08-23 ENCOUNTER — Ambulatory Visit (INDEPENDENT_AMBULATORY_CARE_PROVIDER_SITE_OTHER): Payer: BLUE CROSS/BLUE SHIELD | Admitting: Podiatry

## 2016-08-23 DIAGNOSIS — M722 Plantar fascial fibromatosis: Secondary | ICD-10-CM | POA: Diagnosis not present

## 2016-08-23 NOTE — Progress Notes (Signed)
She presents today for follow-up of her bilateral plantar fasciitis.  Objective: Vital signs are stable she is alert and oriented 3. Pulses are palpable. She has been on palpation medially continued tubercles bilateral right greater than left.  Assessment: Intractable plantar fasciitis bilateral.  Plan: Reinjected the bilateral plantar fascia today utilizing Kenalog and local anesthetic will follow up with her in several weeks.

## 2016-09-01 ENCOUNTER — Ambulatory Visit (INDEPENDENT_AMBULATORY_CARE_PROVIDER_SITE_OTHER): Payer: BLUE CROSS/BLUE SHIELD | Admitting: Clinical

## 2016-09-01 DIAGNOSIS — F4310 Post-traumatic stress disorder, unspecified: Secondary | ICD-10-CM | POA: Diagnosis not present

## 2016-09-06 ENCOUNTER — Encounter (HOSPITAL_COMMUNITY): Payer: Self-pay | Admitting: Clinical
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
## 2016-09-07 ENCOUNTER — Ambulatory Visit (HOSPITAL_BASED_OUTPATIENT_CLINIC_OR_DEPARTMENT_OTHER): Payer: BLUE CROSS/BLUE SHIELD | Admitting: Hematology and Oncology

## 2016-09-07 ENCOUNTER — Encounter: Payer: Self-pay | Admitting: Hematology and Oncology

## 2016-09-07 DIAGNOSIS — C50311 Malignant neoplasm of lower-inner quadrant of right female breast: Secondary | ICD-10-CM | POA: Diagnosis not present

## 2016-09-07 DIAGNOSIS — R4589 Other symptoms and signs involving emotional state: Secondary | ICD-10-CM

## 2016-09-07 DIAGNOSIS — M791 Myalgia: Secondary | ICD-10-CM | POA: Diagnosis not present

## 2016-09-07 DIAGNOSIS — Z79810 Long term (current) use of selective estrogen receptor modulators (SERMs): Secondary | ICD-10-CM

## 2016-09-07 DIAGNOSIS — R5383 Other fatigue: Secondary | ICD-10-CM

## 2016-09-07 DIAGNOSIS — Z170 Estrogen receptor positive status [ER+]: Secondary | ICD-10-CM

## 2016-09-07 DIAGNOSIS — M2550 Pain in unspecified joint: Secondary | ICD-10-CM

## 2016-09-07 NOTE — Progress Notes (Signed)
Patient Care Team: Albina Billet, MD as PCP - General (Internal Medicine) Albina Billet, MD (Internal Medicine) Nicholas Lose, MD as Consulting Physician (Hematology and Oncology) Excell Seltzer, MD as Consulting Physician (General Surgery) Gery Pray, MD as Consulting Physician (Radiation Oncology) Delice Bison Charlestine Massed, NP as Nurse Practitioner (Hematology and Oncology)  DIAGNOSIS:  Encounter Diagnosis  Name Primary?  . Malignant neoplasm of lower-inner quadrant of right breast of female, estrogen receptor positive ([place])     SUMMARY OF ONCOLOGIC HISTORY:   Breast cancer of lower-inner quadrant of right female breast ([place])   05/14/2014 Mammogram    Right mammogram: 1.5 cm round mass in right breast at 3:00      05/14/2014 Breast US    Right breast: irregular, hypoechoic mass with indistinct margins at 3:00 mearying 1.1 x 0.8 x 1.3 cm.      05/16/2014 Initial Biopsy    Ultrasound-guided biopsy: Invasive ductal carcinoma with DCIS, grade 1-2, ER/PR positive, Ki-67 43%, HER-2 positive      05/28/2014 Breast MRI    1.4 x 1.2 x 1.0 cm biopsy-proven invasive ductal carcinoma and ductal carcinoma in situ in the lower inner quadrant of the right breast.       05/28/2014 Clinical Stage    Stage IA: T1c N0      06/13/2014 Procedure    Breast/Next: Variant of Unknown Significance in the ATM gene called p.4477C>T, p.L1493F. Otherwise, no clinically significant mutation at ATM, BARD1, BRCA1, BRCA2, BRIP1, CDH1, CHEK2, MRE11A, MUTYH, NBN, NF1, PALB2, PTEN, RAD50, RAD51C, RAD51D, and TP53      06/18/2014 Surgery    Right breast lumpectomy: Invasive ductal carcinoma 1.3 cm negative for LVI; DCIS 1 mm from margins, 1 sentinel node negative, grade 3 ER 95%, PR 77%, HER-2 amplified ratio 8.25, Ki-67 43%      06/18/2014 Pathologic Stage    Stage IA: T1c N0 M0      07/18/2014 - 10/03/2014 Chemotherapy    Abraxane Herceptin weekly 12 followed by Herceptin maintenance every 3 weeks to  complete one year completed 07/03/2015      10/30/2014 - 12/17/2014 Radiation Therapy    Adjuvant radiation therapy: Right breast 50.4 gray in 28 fractions, lumpectomy cavity boost 12 gray in 6 fractions      02/01/2015 -  Anti-estrogen oral therapy    Tamoxifen 20 mg daily       CHIEF COMPLIANT: Follow-up on tamoxifen therapy  INTERVAL HISTORY: Krystal Kelley is a 46 year old with above-mentioned history of right breast cancer currently on tamoxifen therapy. She is experiencing profound emotional disturbances to where she had been crying a lot and is currently seeking assistance and care from a therapist. Her symptoms have improved somewhat. She has been doing a lot of public speaking at different events and functions to share her story. She denies any lumps or nodules in the breasts. She has been eating better and losing weight. She lost about 30 pounds.  REVIEW OF SYSTEMS:   Constitutional: Denies fevers, chills or abnormal weight loss Eyes: Denies blurriness of vision Ears, nose, mouth, throat, and face: Denies mucositis or sore throat Respiratory: Denies cough, dyspnea or wheezes Cardiovascular: Denies palpitation, chest discomfort Gastrointestinal:  Denies nausea, heartburn or change in bowel habits Skin: Denies abnormal skin rashes Lymphatics: Denies new lymphadenopathy or easy bruising Neurological:Denies numbness, tingling or new weaknesses Behavioral/Psych: Mood swings and emotional roller coaster Extremities: No lower extremity edema Breast:  denies any pain or lumps or nodules in either breasts All  other systems were reviewed with the patient and are negative.  I have reviewed the past medical history, past surgical history, social history and family history with the patient and they are unchanged from previous note.  ALLERGIES:  has No Known Allergies.  MEDICATIONS:  Current Outpatient Prescriptions  Medication Sig Dispense Refill  . BELVIQ 10 MG TABS Take 100 mg  by mouth 2 (two) times daily.  1  . cycloSPORINE (RESTASIS) 0.05 % ophthalmic emulsion Place 1 drop into both eyes 2 (two) times daily.    . Eluxadoline (VIBERZI) 100 MG TABS Take 1 tablet by mouth 2 (two) times daily. 60 tablet 5  . furosemide (LASIX) 40 MG tablet Take 1 tablet (40 mg total) by mouth daily as needed. 30 tablet 6  . hydrOXYzine (VISTARIL) 25 MG capsule Take 1 capsule (25 mg total) by mouth at bedtime as needed (sleep). 30 capsule 1  . tamoxifen (NOLVADEX) 20 MG tablet TAKE 1 TABLET BY MOUTH DAILY 90 tablet 3   No current facility-administered medications for this visit.     PHYSICAL EXAMINATION: ECOG PERFORMANCE STATUS: 1 - Symptomatic but completely ambulatory  Vitals:   09/07/16 1138  BP: (!) 150/84  Pulse: 87  Resp: 18  Temp: 98.4 F (36.9 C)   Filed Weights   09/07/16 1138  Weight: 244 lb 1.6 oz (110.7 kg)    GENERAL:alert, no distress and comfortable SKIN: skin color, texture, turgor are normal, no rashes or significant lesions EYES: normal, Conjunctiva are pink and non-injected, sclera clear OROPHARYNX:no exudate, no erythema and lips, buccal mucosa, and tongue normal  NECK: supple, thyroid normal size, non-tender, without nodularity LYMPH:  no palpable lymphadenopathy in the cervical, axillary or inguinal LUNGS: clear to auscultation and percussion with normal breathing effort HEART: regular rate & rhythm and no murmurs and no lower extremity edema ABDOMEN:abdomen soft, non-tender and normal bowel sounds MUSCULOSKELETAL:no cyanosis of digits and no clubbing  NEURO: alert & oriented x 3 with fluent speech, no focal motor/sensory deficits EXTREMITIES: No lower extremity edema  LABORATORY DATA:  I have reviewed the data as listed   Chemistry      Component Value Date/Time   NA 137 02/20/2016 1822   NA 142 12/12/2015 1157   K 3.1 (L) 02/20/2016 1822   K 3.9 12/12/2015 1157   CL 103 02/20/2016 1822   CL 102 06/11/2013 0408   CO2 25 02/20/2016 1822     CO2 30 (H) 12/12/2015 1157   BUN 8 02/20/2016 1822   BUN 13.8 12/12/2015 1157   CREATININE 0.99 02/20/2016 1822   CREATININE 1.1 12/12/2015 1157      Component Value Date/Time   CALCIUM 8.4 (L) 02/20/2016 1822   CALCIUM 9.6 12/12/2015 1157   ALKPHOS 52 02/20/2016 1822   ALKPHOS 59 12/12/2015 1157   AST 43 (H) 02/20/2016 1822   AST 27 12/12/2015 1157   ALT 43 02/20/2016 1822   ALT 26 12/12/2015 1157   BILITOT 0.6 02/20/2016 1822   BILITOT 0.42 12/12/2015 1157       Lab Results  Component Value Date   WBC 14.2 (H) 02/20/2016   HGB 11.9 (L) 02/20/2016   HCT 35.2 (L) 02/20/2016   MCV 92.4 02/20/2016   PLT 279 02/20/2016   NEUTROABS 11.1 (H) 02/20/2016    ASSESSMENT & PLAN:  Breast cancer of lower-inner quadrant of right female breast Right breast lumpectomy 06/18/2014: Invasive ductal carcinoma 1.3 cm negative for LVI; DCIS 1 mm from margins, 1 sentinel node negative,  grade 3 ER 95%, PR 77%, HER-2 amplified ratio 8.25, Ki-67 43% T1 cN0 M0 stage IA  Treatment summary S/P Adjuvant chemo with Abraxane Herceptin started 07/18/2014 completed 10/03/14, Herceptin maintenance completed 07/03/2015 Status post adjuvant radiation completed 12/17/2014 Started tamoxifen 12/26/2014  Current treatment:Tamoxifen 20 mg daily 12/26/2014  Tamoxifen toxicities:  1. Emotional changes especially crying spells and anxiety spells: Much improved Since she is seeing a therapist 2. Vaginal yeast infections: Treated with over-the-counter creams with improvement. Denies any hot flashes. 3. Diffuse myalgias and arthralgias  Fatigue: Improving with activity  Obesity: Patient is exercising more often. I instructed her to join the living well class to help her lose weight. She has lost 30 pounds  Return to clinic in 1 year for follow-up.   I spent 25 minutes talking to the patient of which more than half was spent in counseling and coordination of care.  No orders of the defined types  were placed in this encounter.  The patient has a good understanding of the overall plan. she agrees with it. she will call with any problems that may develop before the next visit here.   Rulon Eisenmenger, MD 09/07/16

## 2016-09-07 NOTE — Assessment & Plan Note (Signed)
Right breast lumpectomy 06/18/2014: Invasive ductal carcinoma 1.3 cm negative for LVI; DCIS 1 mm from margins, 1 sentinel node negative, grade 3 ER 95%, PR 77%, HER-2 amplified ratio 8.25, Ki-67 43% T1 cN0 M0 stage IA  Treatment summary S/P Adjuvant chemo with Abraxane Herceptin started 07/18/2014 completed 10/03/14, Herceptin maintenance completed 07/03/2015 Status post adjuvant radiation completed 12/17/2014 Started tamoxifen 12/26/2014  Current treatment:Tamoxifen 20 mg daily 12/26/2014  Tamoxifen toxicities:  1. Emotional changes especially crying spells and anxiety spells: Much improved 2. Vaginal yeast infections: Treated with over-the-counter creams with improvement. Denies any hot flashes.  Fatigue: Improving with activity  Obesity: Patient is exercising more often. I instructed her to join the living well class to help her lose weight.  Return to clinic in 1 year for follow-up. 

## 2016-09-15 ENCOUNTER — Encounter (HOSPITAL_BASED_OUTPATIENT_CLINIC_OR_DEPARTMENT_OTHER): Payer: Self-pay | Admitting: *Deleted

## 2016-09-16 ENCOUNTER — Encounter (HOSPITAL_BASED_OUTPATIENT_CLINIC_OR_DEPARTMENT_OTHER)
Admission: RE | Admit: 2016-09-16 | Discharge: 2016-09-16 | Disposition: A | Discharge status: Home / Self Care | Payer: BLUE CROSS/BLUE SHIELD | Source: Ambulatory Visit | Attending: Orthopedic Surgery | Admitting: Orthopedic Surgery

## 2016-09-16 DIAGNOSIS — Z01818 Encounter for other preprocedural examination: Secondary | ICD-10-CM | POA: Diagnosis present

## 2016-09-16 LAB — BASIC METABOLIC PANEL WITH GFR
BUN: 10 mg/dL (ref 6–20)
Calcium: 9.2 mg/dL (ref 8.9–10.3)
Creatinine, Ser: 0.93 mg/dL (ref 0.44–1.00)
GFR calc non Af Amer: >60 mL/min (ref >60)
Glucose, Bld: 94 mg/dL (ref 65–99)

## 2016-09-16 LAB — BASIC METABOLIC PANEL
Anion gap: 7 (ref 5–15)
CO2: 29 mmol/L (ref 22–32)
Chloride: 103 mmol/L (ref 101–111)
GFR calc Af Amer: >60 mL/min (ref >60)
Potassium: 3.9 mmol/L (ref 3.5–5.1)
Sodium: 139 mmol/L (ref 135–145)

## 2016-09-21 ENCOUNTER — Ambulatory Visit (HOSPITAL_BASED_OUTPATIENT_CLINIC_OR_DEPARTMENT_OTHER): Payer: BLUE CROSS/BLUE SHIELD | Admitting: Certified Registered"

## 2016-09-21 ENCOUNTER — Encounter (HOSPITAL_BASED_OUTPATIENT_CLINIC_OR_DEPARTMENT_OTHER): Payer: Self-pay | Admitting: Certified Registered"

## 2016-09-21 ENCOUNTER — Ambulatory Visit (HOSPITAL_BASED_OUTPATIENT_CLINIC_OR_DEPARTMENT_OTHER)
Admission: RE | Admit: 2016-09-21 | Discharge: 2016-09-21 | Disposition: A | Discharge status: Home / Self Care | Payer: BLUE CROSS/BLUE SHIELD | Source: Ambulatory Visit | Attending: Orthopedic Surgery | Admitting: Orthopedic Surgery

## 2016-09-21 ENCOUNTER — Encounter (HOSPITAL_BASED_OUTPATIENT_CLINIC_OR_DEPARTMENT_OTHER)
Admission: RE | Disposition: A | Discharge status: Home / Self Care | Payer: Self-pay | Source: Ambulatory Visit | Attending: Orthopedic Surgery

## 2016-09-21 DIAGNOSIS — Z79899 Other long term (current) drug therapy: Secondary | ICD-10-CM | POA: Insufficient documentation

## 2016-09-21 DIAGNOSIS — F329 Major depressive disorder, single episode, unspecified: Secondary | ICD-10-CM | POA: Insufficient documentation

## 2016-09-21 DIAGNOSIS — F419 Anxiety disorder, unspecified: Secondary | ICD-10-CM | POA: Insufficient documentation

## 2016-09-21 DIAGNOSIS — Z923 Personal history of irradiation: Secondary | ICD-10-CM | POA: Insufficient documentation

## 2016-09-21 DIAGNOSIS — Z8041 Family history of malignant neoplasm of ovary: Secondary | ICD-10-CM | POA: Insufficient documentation

## 2016-09-21 DIAGNOSIS — Z853 Personal history of malignant neoplasm of breast: Secondary | ICD-10-CM | POA: Insufficient documentation

## 2016-09-21 DIAGNOSIS — Z800 Family history of malignant neoplasm of digestive organs: Secondary | ICD-10-CM | POA: Diagnosis not present

## 2016-09-21 DIAGNOSIS — Z6841 Body Mass Index (BMI) 40.0 and over, adult: Secondary | ICD-10-CM | POA: Diagnosis not present

## 2016-09-21 DIAGNOSIS — Z8051 Family history of malignant neoplasm of kidney: Secondary | ICD-10-CM | POA: Insufficient documentation

## 2016-09-21 DIAGNOSIS — Z79810 Long term (current) use of selective estrogen receptor modulators (SERMs): Secondary | ICD-10-CM | POA: Insufficient documentation

## 2016-09-21 DIAGNOSIS — Z8042 Family history of malignant neoplasm of prostate: Secondary | ICD-10-CM | POA: Insufficient documentation

## 2016-09-21 DIAGNOSIS — G5602 Carpal tunnel syndrome, left upper limb: Secondary | ICD-10-CM | POA: Insufficient documentation

## 2016-09-21 HISTORY — DX: Anxiety disorder, unspecified: F41.9

## 2016-09-21 HISTORY — DX: Depression, unspecified: F32.A

## 2016-09-21 HISTORY — DX: Major depressive disorder, single episode, unspecified: F32.9

## 2016-09-21 HISTORY — DX: Localized edema: R60.0

## 2016-09-21 HISTORY — DX: Edema, unspecified: R60.9

## 2016-09-21 HISTORY — PX: CARPAL TUNNEL RELEASE: SHX101

## 2016-09-21 SURGERY — CARPAL TUNNEL RELEASE
Anesthesia: Regional | Site: Wrist | Laterality: Left

## 2016-09-21 MED ORDER — PROPOFOL 500 MG/50ML IV EMUL
INTRAVENOUS | Status: DC | PRN
  Administered 2016-09-21: 100 ug/kg/min via INTRAVENOUS

## 2016-09-21 MED ORDER — BUPIVACAINE HCL (PF) 0.25 % IJ SOLN
INTRAMUSCULAR | Status: AC
  Filled 2016-09-21: qty 90

## 2016-09-21 MED ORDER — ONDANSETRON HCL 4 MG/2ML IJ SOLN
INTRAMUSCULAR | Status: DC | PRN
  Administered 2016-09-21: 4 mg via INTRAVENOUS

## 2016-09-21 MED ORDER — SCOPOLAMINE 1 MG/3DAYS TD PT72: 1.0000 | MEDICATED_PATCH | Freq: Once | TRANSDERMAL | Status: DC | PRN

## 2016-09-21 MED ORDER — MIDAZOLAM HCL 2 MG/2ML IJ SOLN
INTRAMUSCULAR | Status: AC
  Filled 2016-09-21: qty 2

## 2016-09-21 MED ORDER — HYDROCODONE-ACETAMINOPHEN 5-325 MG PO TABS: 1.0000 | ORAL_TABLET | Freq: Four times a day (QID) | ORAL | 0 refills | Status: DC | PRN

## 2016-09-21 MED ORDER — MIDAZOLAM HCL 2 MG/2ML IJ SOLN
1.0000 mg | INTRAMUSCULAR | Status: DC | PRN
  Administered 2016-09-21: 2 mg via INTRAVENOUS

## 2016-09-21 MED ORDER — OXYCODONE HCL 5 MG PO TABS
ORAL_TABLET | ORAL | Status: AC
  Filled 2016-09-21: qty 1

## 2016-09-21 MED ORDER — PROMETHAZINE HCL 25 MG/ML IJ SOLN
6.2500 mg | INTRAMUSCULAR | Status: DC | PRN
Start: 2016-09-21 — End: 2016-09-21

## 2016-09-21 MED ORDER — FENTANYL CITRATE (PF) 100 MCG/2ML IJ SOLN
50.0000 ug | INTRAMUSCULAR | Status: DC | PRN
  Administered 2016-09-21: 100 ug via INTRAVENOUS

## 2016-09-21 MED ORDER — HYDROMORPHONE HCL 1 MG/ML IJ SOLN: 0.2500 mg | INTRAMUSCULAR | Status: DC | PRN

## 2016-09-21 MED ORDER — CEFAZOLIN SODIUM-DEXTROSE 2-4 GM/100ML-% IV SOLN
2.0000 g | INTRAVENOUS | Status: AC
  Administered 2016-09-21: 2 g via INTRAVENOUS

## 2016-09-21 MED ORDER — BUPIVACAINE HCL (PF) 0.25 % IJ SOLN
INTRAMUSCULAR | Status: DC | PRN
  Administered 2016-09-21: 10 mL

## 2016-09-21 MED ORDER — CHLORHEXIDINE GLUCONATE 4 % EX LIQD: 60.0000 mL | Freq: Once | CUTANEOUS | Status: DC

## 2016-09-21 MED ORDER — OXYCODONE HCL 5 MG PO TABS
5.0000 mg | ORAL_TABLET | Freq: Once | ORAL | Status: AC
  Administered 2016-09-21: 5 mg via ORAL

## 2016-09-21 MED ORDER — LACTATED RINGERS IV SOLN
INTRAVENOUS | Status: DC
  Administered 2016-09-21: 08:00:00 via INTRAVENOUS

## 2016-09-21 MED ORDER — FENTANYL CITRATE (PF) 100 MCG/2ML IJ SOLN
INTRAMUSCULAR | Status: AC
  Filled 2016-09-21: qty 2

## 2016-09-21 SURGICAL SUPPLY — 38 items
BLADE SURG 15 STRL LF DISP TIS (BLADE) ×1 IMPLANT
BLADE SURG 15 STRL SS (BLADE) ×2
BNDG CMPR 9X4 STRL LF SNTH (GAUZE/BANDAGES/DRESSINGS) ×1
BNDG COHESIVE 3X5 TAN STRL LF (GAUZE/BANDAGES/DRESSINGS) ×2 IMPLANT
BNDG ESMARK 4X9 LF (GAUZE/BANDAGES/DRESSINGS) ×1 IMPLANT
BNDG GAUZE ELAST 4 BULKY (GAUZE/BANDAGES/DRESSINGS) ×2 IMPLANT
CHLORAPREP W/TINT 26ML (MISCELLANEOUS) ×2 IMPLANT
CORDS BIPOLAR (ELECTRODE) ×2 IMPLANT
COVER BACK TABLE 60X90IN (DRAPES) ×2 IMPLANT
COVER MAYO STAND STRL (DRAPES) ×2 IMPLANT
CUFF TOURNIQUET SINGLE 18IN (TOURNIQUET CUFF) ×2 IMPLANT
DRAPE EXTREMITY T 121X128X90 (DRAPE) ×2 IMPLANT
DRAPE SURG 17X23 STRL (DRAPES) ×2 IMPLANT
DRSG PAD ABDOMINAL 8X10 ST (GAUZE/BANDAGES/DRESSINGS) ×2 IMPLANT
GAUZE SPONGE 4X4 12PLY STRL (GAUZE/BANDAGES/DRESSINGS) ×2 IMPLANT
GAUZE XEROFORM 1X8 LF (GAUZE/BANDAGES/DRESSINGS) ×2 IMPLANT
GLOVE BIOGEL PI IND STRL 7.0 (GLOVE) IMPLANT
GLOVE BIOGEL PI IND STRL 8.5 (GLOVE) ×1 IMPLANT
GLOVE BIOGEL PI INDICATOR 7.0 (GLOVE) ×3
GLOVE BIOGEL PI INDICATOR 8.5 (GLOVE) ×1
GLOVE ECLIPSE 6.5 STRL STRAW (GLOVE) ×1 IMPLANT
GLOVE SURG ORTHO 8.0 STRL STRW (GLOVE) ×2 IMPLANT
GLOVE SURG SS PI 6.5 STRL IVOR (GLOVE) ×1 IMPLANT
GOWN STRL REUS W/ TWL LRG LVL3 (GOWN DISPOSABLE) ×1 IMPLANT
GOWN STRL REUS W/ TWL XL LVL3 (GOWN DISPOSABLE) IMPLANT
GOWN STRL REUS W/TWL LRG LVL3 (GOWN DISPOSABLE) ×2
GOWN STRL REUS W/TWL XL LVL3 (GOWN DISPOSABLE) ×4 IMPLANT
NDL PRECISIONGLIDE 27X1.5 (NEEDLE) IMPLANT
NEEDLE PRECISIONGLIDE 27X1.5 (NEEDLE) ×2 IMPLANT
NS IRRIG 1000ML POUR BTL (IV SOLUTION) ×2 IMPLANT
PACK BASIN DAY SURGERY FS (CUSTOM PROCEDURE TRAY) ×2 IMPLANT
STOCKINETTE 4X48 STRL (DRAPES) ×2 IMPLANT
SUT ETHILON 4 0 PS 2 18 (SUTURE) ×2 IMPLANT
SUT VICRYL 4-0 PS2 18IN ABS (SUTURE) IMPLANT
SYR BULB 3OZ (MISCELLANEOUS) ×2 IMPLANT
SYR CONTROL 10ML LL (SYRINGE) ×1 IMPLANT
TOWEL OR 17X24 6PK STRL BLUE (TOWEL DISPOSABLE) ×2 IMPLANT
UNDERPAD 30X30 (UNDERPADS AND DIAPERS) ×1 IMPLANT

## 2016-09-21 NOTE — Anesthesia Procedure Notes (Signed)
Anesthesia Regional Block: Bier block (IV Regional)   Pre-Anesthetic Checklist: ,, timeout performed, Correct Patient, Correct Site, Correct Laterality, Correct Procedure,, site marked, surgical consent,, at surgeon's request  Laterality: Left     Needles:  Injection technique: Single-shot  Needle Type: Other      Needle Gauge: 22     Additional Needles:   Procedures:,,,,,,, Esmarch exsanguination, single tourniquet utilized,  Narrative:   Performed by: Personally       

## 2016-09-21 NOTE — Op Note (Signed)
Dictation Number [id number]

## 2016-09-21 NOTE — Anesthesia Procedure Notes (Signed)
Procedure Name: MAC Date/Time: 09/21/2016 8:48 AM Performed by: Eliora Nienhuis D Pre-anesthesia Checklist: Patient identified, Emergency Drugs available, Suction available, Patient being monitored and Timeout performed Patient Re-evaluated:Patient Re-evaluated prior to inductionOxygen Delivery Method: Simple face mask

## 2016-09-21 NOTE — Discharge Instructions (Addendum)

## 2016-09-21 NOTE — Transfer of Care (Signed)
Immediate Anesthesia Transfer of Care Note  Patient: Krystal Kelley  Procedure(s) Performed: Procedure(s) with comments: LEFT CARPAL TUNNEL RELEASE (Left) - REG/FAB  Patient Location: PACU  Anesthesia Type:Bier block  Level of Consciousness: awake, alert , oriented and patient cooperative  Airway & Oxygen Therapy: Patient Spontanous Breathing and Patient connected to face mask oxygen  Post-op Assessment: Report given to RN and Post -op Vital signs reviewed and stable  Post vital signs: Reviewed and stable  Last Vitals:  Vitals:   09/21/16 0751  BP: (!) 141/90  Pulse: 74  Resp: 18  Temp: 36.7 C    Last Pain:  Vitals:   09/21/16 0751  TempSrc: Oral      Patients Stated Pain Goal: 0 (46/28/63 8177)  Complications: No apparent anesthesia complications

## 2016-09-21 NOTE — Anesthesia Preprocedure Evaluation (Addendum)
Anesthesia Evaluation  Patient identified by MRN, date of birth, ID band Patient awake    Reviewed: Allergy & Precautions, NPO status , Patient's Chart, lab work & pertinent test results  Airway Mallampati: I   Neck ROM: Full    Dental no notable dental hx.    Pulmonary neg pulmonary ROS,    breath sounds clear to auscultation       Cardiovascular negative cardio ROS   Rhythm:Regular Rate:Normal     Neuro/Psych PSYCHIATRIC DISORDERS Anxiety Depression negative neurological ROS     GI/Hepatic negative GI ROS, Neg liver ROS,   Endo/Other  Morbid obesity  Renal/GU negative Renal ROS     Musculoskeletal   Abdominal   Peds  Hematology   Anesthesia Other Findings   Reproductive/Obstetrics                          Anesthesia Physical Anesthesia Plan  ASA: II  Anesthesia Plan: Bier Block   Post-op Pain Management:    Induction:   PONV Risk Score and Plan: 3 and Ondansetron, Dexamethasone, Propofol, Midazolam and Treatment may vary due to age  Airway Management Planned:   Additional Equipment:   Intra-op Plan:   Post-operative Plan:   Informed Consent: I have reviewed the patients History and Physical, chart, labs and discussed the procedure including the risks, benefits and alternatives for the proposed anesthesia with the patient or authorized representative who has indicated his/her understanding and acceptance.     Plan Discussed with:   Anesthesia Plan Comments:         Anesthesia Quick Evaluation

## 2016-09-21 NOTE — Brief Op Note (Signed)
09/21/2016  9:04 AM  PATIENT:  Krystal Kelley  46 y.o. female  PRE-OPERATIVE DIAGNOSIS:  LEFT CARPAL TUNNEL SYNDROME  POST-OPERATIVE DIAGNOSIS:  LEFT CARPAL TUNNEL SYNDROME  PROCEDURE:  Procedure(s) with comments: LEFT CARPAL TUNNEL RELEASE (Left) - REG/FAB  SURGEON:  Surgeon(s) and Role:    * Daryll Brod, MD - Primary  PHYSICIAN ASSISTANT:   ASSISTANTS: none   ANESTHESIA:   local, regional and IV sedation  EBL:  Total I/O In: 600 [I.V.:600] Out: -   BLOOD ADMINISTERED:none  DRAINS: none   LOCAL MEDICATIONS USED:  BUPIVICAINE   SPECIMEN:  No Specimen  DISPOSITION OF SPECIMEN:  N/A  COUNTS:  YES  TOURNIQUET:   Total Tourniquet Time Documented: Forearm (Left) - 20 minutes Total: Forearm (Left) - 20 minutes   DICTATION: .Other Dictation: Dictation Number [id number]  PLAN OF CARE: Discharge to home after PACU  PATIENT DISPOSITION:  PACU - hemodynamically stable.

## 2016-09-21 NOTE — H&P (Signed)
Krystal Kelley is an 46 y.o. female.   Chief Complaint: numbness left hand HPI: Krystal Kelley is a 46 year old right-hand-dominant female referred by Dr. Milinda Pointer for consultation regarding numbness and tingling of her left hand. She is status post carpal tunnel release done on her right side by Dr. Fausto Skillern in 2008. She is complaining of intermittent numbness and tingling on her left hand all fingers. This awakens her 7 out of 7 nights. She states been going on for years increasing over the past year. He had nerve conductions done in the past at which time she was told her left side was not bad enough to operate on. She has no history of injury to the hand or to the neck. She states driving causes increased symptoms for her. She has not taken anything for this. She has no history diabetes thyroid problems arthritis gout. Family history is positive diabetes negative for thyroid problems arthritis and gout. He is not complaining of significant pain. She is complaining of numbness and tingling. She was referred to Dr. Thereasa Parkin for nerve conductions. Her nerve conductions are discussed with her. She has a motor delay of 5.94. She has a an enlarged nerve to 17 mm at the wrist on her left side.              Past Medical History:  Diagnosis Date  . Anxiety   . Breast cancer, right breast ([place])   . Depression   . Peripheral edema    on lasix  . Radiation 10/30/14-12/17/14   right breast 50.4 gray    Past Surgical History:  Procedure Laterality Date  . BREAST BIOPSY  05/16/14  . BREAST LUMPECTOMY Right 06/18/2014  . bunion surgrery    . carpel tunnel    . CHOLECYSTECTOMY  2015  . PLANTAR FASCIECTOMY    . PORTACATH PLACEMENT Left 06/18/2014   Procedure: INSERTION PORT-A-CATH;  Surgeon: Excell Seltzer, MD;  Location: [place];  Service: General;  Laterality: Left;    Family History  Problem Relation Age of Onset  . Kidney cancer Father        currently 21  . Prostate cancer  Maternal Uncle        deceased 61  . Lung cancer Maternal Grandfather        deceased 61; smoker  . Colon cancer Paternal Grandfather        deceased 5s  . Cancer Other        pat half-sister; ? uterine vs. ovarian ca   Social History:  reports that she has never smoked. She has never used smokeless tobacco. She reports that she drinks alcohol. She reports that she does not use drugs.  Allergies: No Known Allergies  No prescriptions prior to admission.    No results found for this or any previous visit (from the past 48 hour(s)).  No results found.   Pertinent items are noted in HPI.  Height 5\' 5"  (1.651 m), weight 110.7 kg (244 lb).  General appearance: alert, cooperative and appears stated age Head: Normocephalic, without obvious abnormality Neck: no JVD Resp: clear to auscultation bilaterally Cardio: regular rate and rhythm, S1, S2 normal, no murmur, click, rub or gallop GI: soft, non-tender; bowel sounds normal; no masses,  no organomegaly Extremities: numbness left hand Pulses: 2+ and symmetric Skin: Skin color, texture, turgor normal. No rashes or lesions Neurologic: Grossly normal Incision/Wound: na  Assessment/Plan Assessment:  1. Carpal tunnel syndrome of left wrist    Plan: Discussed possibility  of carpal tunnel release with her.. Pre-peri-and postoperative course are discussed along with risks and complications. She is aware that there is no guarantee to the surgery the possibility of infection recurrence injury to arteries nerves tendons incomplete release symptoms and dystrophy. She would like to proceed. Questions are encouraged and answered to her satisfaction. She is scheduled for left carpal tunnel release and outpatient under regional anesthesia.      Will Schier R 09/21/2016, 4:22 AM

## 2016-09-21 NOTE — Anesthesia Postprocedure Evaluation (Signed)
Anesthesia Post Note  Patient: Krystal Kelley  Procedure(s) Performed: Procedure(s) (LRB): LEFT CARPAL TUNNEL RELEASE (Left)     Patient location during evaluation: PACU Anesthesia Type: Bier Block and MAC Level of consciousness: awake and alert Pain management: pain level controlled Vital Signs Assessment: post-procedure vital signs reviewed and stable Respiratory status: spontaneous breathing, nonlabored ventilation, respiratory function stable and patient connected to nasal cannula oxygen Cardiovascular status: stable and blood pressure returned to baseline Anesthetic complications: no    Last Vitals:  Vitals:   09/21/16 0930 09/21/16 1000  BP: (!) 161/88 (!) 161/88  Pulse: 78 78  Resp: 11 16  Temp:  36.9 C    Last Pain:  Vitals:   09/21/16 0930  TempSrc:   PainSc: 5                  Ketih Goodie,JAMES TERRILL

## 2016-09-22 ENCOUNTER — Encounter (HOSPITAL_BASED_OUTPATIENT_CLINIC_OR_DEPARTMENT_OTHER): Payer: Self-pay | Admitting: Orthopedic Surgery

## 2016-09-22 NOTE — Op Note (Signed)
NAME:  Krystal Kelley, PIGEON NO.:  [id number]  MEDICAL RECORD NO.:  [id number]  LOCATION:                                 FACILITY:  PHYSICIAN:  Daryll Brod, M.D.       DATE OF BIRTH:  04/08/1971  DATE OF PROCEDURE:  09/21/2016 DATE OF DISCHARGE:                              OPERATIVE REPORT   PREOPERATIVE DIAGNOSIS:  Carpal tunnel syndrome, left hand.  POSTOPERATIVE DIAGNOSIS:  Carpal tunnel syndrome, left hand.  OPERATION:  Decompression of left median nerve.  SURGEON:  Daryll Brod, M.D.  ASSISTANT:  None.  ANESTHESIA:  Forearm-based IV regional with local infiltration and IV sedation.  PLACE OF SURGERY:  [facility].  HISTORY:  The patient is a 46 year old female with a history of carpal tunnel syndrome.  EMG nerve conduction is positive, which has not responded to conservative treatment.  Pre, peri and postoperative course have been discussed along with risks and complications.  She is aware that there is no guarantee to the surgery, the possibility of infection; recurrence of injury to arteries, nerves, tendons; incomplete relief of symptoms and dystrophy.  In preoperative area, the patient is seen, the extremity was marked by both patient and surgeon.  Antibiotic given.  PROCEDURE IN DETAIL:  The patient was brought to the operating room, where a forearm-based IV regional anesthetic was carried out without difficulty under the direction of the Anesthesia Department.  She was prepped using ChloraPrep in supine position with the left arm free.  A 3- minute dry time was allowed and time-out taken, confirming the patient and procedure.  A longitudinal incision was made in the left palm, carried down through subcutaneous tissue.  Bleeders were electrocauterized with bipolar.  The palmar fascia was split.  The superficial palmar arch was identified.  The flexor tendon to the ring and little finger was identified.  Retractors were placed retracting  the median nerve, flexor tendons radially and the ulnar nerve ulnarly.  The flexor retinaculum was then released on its ulnar aspect.  A right angle and Sewell retractor was placed between skin and forearm fascia.  The tendons were then dissected free with blunt dissection.  The flexor retinaculum proximally, distal forearm fascia was released for approximately 2 cm proximal to the wrist crease under direct vision. The canal was explored.  An area of compression to the nerve was apparent.  Motor branch entered into muscle distally.  The wound was irrigated with saline.  The skin closed with interrupted 4-0 nylon sutures.  Local infiltration with 0.25% bupivacaine without epinephrine was given.  10 mL was used.  A sterile compressive dressing with the fingers free was applied.  On deflation of the tourniquet, all fingers immediately pinked.  She was taken to the recovery room for observation in satisfactory condition. She will be discharged to home to return to the [place] in 1 week on [place].    ______________________________ Daryll Brod, M.D.   ______________________________ Daryll Brod, M.D.    GK/MEDQ  D:  09/21/2016  T:  09/21/2016  Job:  [id number]

## 2016-09-29 ENCOUNTER — Ambulatory Visit (INDEPENDENT_AMBULATORY_CARE_PROVIDER_SITE_OTHER): Payer: BLUE CROSS/BLUE SHIELD | Admitting: Clinical

## 2016-09-29 DIAGNOSIS — F4310 Post-traumatic stress disorder, unspecified: Secondary | ICD-10-CM | POA: Diagnosis not present

## 2016-09-29 NOTE — Progress Notes (Signed)
THERAPIST PROGRESS NOTE  Session Time: 9:10 - 9:55  Participation Level: Active  Behavioral Response: CasualAlertDepressed  Type of Therapy: Individual Therapy  Treatment Goals addressed: Improve psychiatric symptoms,  discuss and process trauma,  learn about diagnosis,    Interventions: Motivational Interviewing,  psychoeducation,   Summary: Krystal Kelley is a 46 y.o. female who presents with PTSD.   Suicidal/Homicidal: Nowithout intent/plan  Therapist ResponseAllyson Sabal met with clinician for an individual session. She discussed her psychiatric symptoms, her current life events and her goals for therapy. [name] shared that she would like to recover from her PTSD. Clinician shared that clinician would be departing from the practice and about her options to a new clinician. She shared that she would like to work with clinicians replacement. Clinician encouraged her to speak to the front desk about that. Client and clinician discussed PTSD, her symptoms, and how PTSD is formed. Clinician discussed with her unhelpful thoughts that can be formed when the trauma occurred. Client and clinician discussed some of the ways that she could work with the therapist to change her unhelpful thoughts and to recover from her PTSD.   Plan: Return again in 1-2 weeks.  Diagnosis: Axis I: PTSD       Kimmie Doren A, LCSW 09/29/2016

## 2016-10-04 ENCOUNTER — Encounter (HOSPITAL_COMMUNITY): Payer: Self-pay | Admitting: Clinical

## 2016-10-06 ENCOUNTER — Ambulatory Visit (INDEPENDENT_AMBULATORY_CARE_PROVIDER_SITE_OTHER): Payer: BLUE CROSS/BLUE SHIELD | Admitting: Podiatry

## 2016-10-06 DIAGNOSIS — M722 Plantar fascial fibromatosis: Secondary | ICD-10-CM | POA: Diagnosis not present

## 2016-10-06 NOTE — Progress Notes (Signed)
She presents today for follow-up of her bilateral plantar fasciitis. She states is doing very well and having trouble with the orthotics. She states that her toes are hanging off the end of her orthotics. She did not bring them with her today.  Objective: Vital signs are stable she's alert 3 pulses are palpable no pain on palpation medially continued to bilateral still has tenderness on palpation of the fifth metatarsal head of the right foot. Consistent with tailor's bunion deformity bursitis and capsulitis.  Assessment: Well-healing plantar fasciitis bilateral.  Plan: She will follow-up with Krystal Kelley in 1 week for orthotic augmentation. She will also follow up with me in 6 weeks. We will discuss surgical intervention at that time.

## 2016-10-13 ENCOUNTER — Ambulatory Visit: Payer: BLUE CROSS/BLUE SHIELD

## 2016-10-25 ENCOUNTER — Ambulatory Visit (HOSPITAL_COMMUNITY): Payer: Self-pay | Admitting: Licensed Clinical Social Worker

## 2016-10-27 ENCOUNTER — Telehealth: Payer: Self-pay

## 2016-10-27 NOTE — Telephone Encounter (Addendum)
Pt called to request a new referral for behavioral health. Pt states that behavioral health is only seeing patient once a month and pt has been going through a lot and needs more time to be seen. Pt states that she had requested to be seen more often than just once a month with behavioral health but states that they have a busy schedule. Pt wants to have another referral done. Will look into it and find out why she can't be seen more often at their office. Will send out referral if needed, to another facility. Pt also reports increased edema to BLE. Pt states that its swollen everyday,but does improve once she is sitting and elevating her legs up. Advised pt to stick to a low sodium diet to help decrease swelling. Pt takes lasix 40mg  po daily. Advised her to call and update if swelling does not improve. Denies any pain with swelling at this time and only occurs when she is up and walking around for a period of time. Pt verbalized understanding of this conversation and is appreciative of call.  1445- Received call from behavioral health nurse regarding pt appts. Pt instructed to make her appts ahead of time for the next few weeks so that she is able to be seen more often than just once a month. Pt has an appt with Beth next week and will need to make sure she schedules her next 4-5 appointments in advance.Called pt and notified her of this update. Pt verbalized understanding and will call if she has any issues.

## 2016-11-03 ENCOUNTER — Encounter (HOSPITAL_COMMUNITY): Payer: Self-pay | Admitting: Licensed Clinical Social Worker

## 2016-11-03 ENCOUNTER — Ambulatory Visit: Payer: BLUE CROSS/BLUE SHIELD

## 2016-11-03 ENCOUNTER — Ambulatory Visit (INDEPENDENT_AMBULATORY_CARE_PROVIDER_SITE_OTHER): Payer: BLUE CROSS/BLUE SHIELD | Admitting: Licensed Clinical Social Worker

## 2016-11-03 DIAGNOSIS — F4310 Post-traumatic stress disorder, unspecified: Secondary | ICD-10-CM | POA: Diagnosis not present
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
## 2016-11-09 ENCOUNTER — Other Ambulatory Visit: Payer: Self-pay

## 2016-11-09 DIAGNOSIS — Z170 Estrogen receptor positive status [ER+]: Principal | ICD-10-CM

## 2016-11-09 DIAGNOSIS — C50311 Malignant neoplasm of lower-inner quadrant of right female breast: Secondary | ICD-10-CM

## 2016-11-09 NOTE — Progress Notes (Addendum)
Spoke with pt regarding request for physical therapy for her hips and shoulders. Pt states that she was doing aquatic therapy at [facility] last year. Pt ran out of visits and would like to restart this therapy again this year. Pt also requesting for all her records since 2/18 to be faxed over to [facility] for long term disability Pierre Bali [phone] MHD#[id number] and fax [phone]. Will send msg to HIM to process pt request. Sent referral to [facility] for aquatic therapy. Pt very appreciative of call. No further concerns or issues at this time.

## 2016-11-10 ENCOUNTER — Other Ambulatory Visit: Payer: Self-pay | Admitting: Hematology and Oncology

## 2016-11-10 ENCOUNTER — Ambulatory Visit: Payer: BLUE CROSS/BLUE SHIELD

## 2016-11-10 DIAGNOSIS — C50311 Malignant neoplasm of lower-inner quadrant of right female breast: Secondary | ICD-10-CM

## 2016-11-17 ENCOUNTER — Encounter: Payer: Self-pay | Admitting: Podiatry

## 2016-11-17 ENCOUNTER — Encounter: Payer: Self-pay | Admitting: Physical Therapy

## 2016-11-17 ENCOUNTER — Ambulatory Visit (INDEPENDENT_AMBULATORY_CARE_PROVIDER_SITE_OTHER): Payer: BLUE CROSS/BLUE SHIELD | Admitting: Podiatry

## 2016-11-17 ENCOUNTER — Ambulatory Visit: Payer: BLUE CROSS/BLUE SHIELD | Attending: Hematology and Oncology | Admitting: Physical Therapy

## 2016-11-17 ENCOUNTER — Ambulatory Visit: Payer: BLUE CROSS/BLUE SHIELD | Admitting: Orthotics

## 2016-11-17 DIAGNOSIS — M25551 Pain in right hip: Secondary | ICD-10-CM | POA: Diagnosis present

## 2016-11-17 DIAGNOSIS — R2689 Other abnormalities of gait and mobility: Secondary | ICD-10-CM | POA: Diagnosis present

## 2016-11-17 DIAGNOSIS — R6889 Other general symptoms and signs: Secondary | ICD-10-CM | POA: Diagnosis present

## 2016-11-17 DIAGNOSIS — R531 Weakness: Secondary | ICD-10-CM | POA: Insufficient documentation

## 2016-11-17 DIAGNOSIS — I890 Lymphedema, not elsewhere classified: Secondary | ICD-10-CM | POA: Diagnosis present

## 2016-11-17 DIAGNOSIS — M722 Plantar fascial fibromatosis: Secondary | ICD-10-CM

## 2016-11-17 DIAGNOSIS — M25611 Stiffness of right shoulder, not elsewhere classified: Secondary | ICD-10-CM | POA: Diagnosis present

## 2016-11-17 DIAGNOSIS — R279 Unspecified lack of coordination: Secondary | ICD-10-CM | POA: Diagnosis present

## 2016-11-17 DIAGNOSIS — M25511 Pain in right shoulder: Secondary | ICD-10-CM | POA: Insufficient documentation

## 2016-11-17 DIAGNOSIS — G8929 Other chronic pain: Secondary | ICD-10-CM | POA: Insufficient documentation

## 2016-11-17 NOTE — Progress Notes (Signed)
She presents today for follow-up of her bilateral heel pain. She states it has started to become painful again.  Objective: Vital signs are stable alert oriented 3 in palpation medial calcaneal tubercles pulses remain palpable pain.  Assessment: Pain in limb secondary to plantar fasciitis.  Plan: Injected bilateral heels today with Kenalog and local anesthetic. She's already about her orthotics.

## 2016-11-17 NOTE — Therapy (Signed)
[place] MAIN [facility] SERVICES [address] [place], [place], [address] Phone: [phone]   Fax:  [phone]  Physical Therapy Evaluation  Patient Details  Name: Krystal Kelley MRN: [id number] Date of Birth: [birth year] Referring Provider: Nicholas Lose  Encounter Date: 11/17/2016      PT End of Session - 11/17/16 1540    Visit Number 1   Number of Visits 8   Date for PT Re-Evaluation 01/12/17   PT Start Time 0315   PT Stop Time 0400   PT Time Calculation (min) 45 min   Activity Tolerance Patient tolerated treatment well;Patient limited by pain   Behavior During Therapy [facility] for tasks assessed/performed      Past Medical History:  Diagnosis Date  . Anxiety   . Breast cancer, right breast ([place])   . Depression   . Peripheral edema    on lasix  . Radiation 10/30/14-12/17/14   right breast 50.4 gray    Past Surgical History:  Procedure Laterality Date  . BREAST BIOPSY  05/16/14  . BREAST LUMPECTOMY Right 06/18/2014  . bunion surgrery    . CARPAL TUNNEL RELEASE Left 09/21/2016   Procedure: LEFT CARPAL TUNNEL RELEASE;  Surgeon: Daryll Brod, MD;  Location: [place];  Service: Orthopedics;  Laterality: Left;  REG/FAB  . carpel tunnel    . CHOLECYSTECTOMY  2015  . PLANTAR FASCIECTOMY    . PORTACATH PLACEMENT Left 06/18/2014   Procedure: INSERTION PORT-A-CATH;  Surgeon: Excell Seltzer, MD;  Location: [place];  Service: General;  Laterality: Left;    There were no vitals filed for this visit.       Subjective Assessment - 11/17/16 1527    Subjective Patient is having pain in bilateral shoulders and bilateral hips  in all her joints due to a medication for the breast cancer. Patient is having difficulty with lifting, pulling, household chores. She reports not being able to get comfortable with sidelying during her sleep due to pain in hips.    Pertinent History Diagnosed with right breast cancer in  February 2016; had lumpectomy June 18, 2014 with 4-5 lymph nodes removed (all negative).  Adjuvant chemotherapy (still on herceptin) and radiation, completed August 2016.  Had superficial blood clot in right leg that caused swelling, so was put on Lasix. ON tamoxifen.    Limitations Walking;Lifting;Standing;House hold activities   How long can you stand comfortably? varies depending on posture   How long can you walk comfortably? vaires depending on posture   Patient Stated Goals to be able to ambulate longer distances and sleep more comfortably   Currently in Pain? Yes   Pain Score 10-Worst pain ever   Pain Location Shoulder   Pain Orientation Right   Pain Descriptors / Indicators Sharp   Pain Type Chronic pain   Pain Onset More than a month ago   Pain Frequency Constant   Aggravating Factors  lying on it , moving it, touch it   Multiple Pain Sites --  hips bilateral R 8/10, left 7/10  shooting            [facility] PT Assessment - 11/17/16 0001      Assessment   Medical Diagnosis  Malignant neoplasm of lower-inner quadrant of right breast    Referring Provider Nicholas Lose   Onset Date/Surgical Date 11/09/16   Hand Dominance Right   Prior Therapy yes     Precautions   Precautions None  Restrictions   Weight Bearing Restrictions No     Balance Screen   Has the patient fallen in the past 6 months No   Has the patient had a decrease in activity level because of a fear of falling?  Yes   Is the patient reluctant to leave their home because of a fear of falling?  No     Home Environment   Living Environment Private residence   Available Help at Discharge Family   Type of [place] to enter   Entrance Stairs-Number of Steps 3   Entrance Stairs-Rails Left   [place] One level   [place] None     Prior Function   Level of Independence Independent   Vocation On disability   Leisure Pt use to work at [employer] - but on LT disability , R hand  dominant , want to play soft ball again , do own lawn, yeard, house work , fishing , and has 22 yr old daughter      Cognition   Overall Cognitive Status Within Functional Limits for tasks assessed      PAIN: 10/10 R shoulder, 8/10 R hip ,7/10 left hip  POSTURE: WNL   PROM/AROM:  BUE WNL,  BLE hip flex decreased to 90 degrees bilaterally due to overall girth, obesity and low back tightness   STRENGTH:  Graded on a 0-5 scale Muscle Group Left Right  Shoulder flex 4/5 4/5  Shoulder Abd 4/5 4/5  Shoulder Ext 4/5 4/5  Shoulder IR/ER 4/5 4/5  Elbow 4/5 4/5  Wrist/hand NT NT  Hip Flex 3/5 3/5  Hip Abd 3+/5 3+/5  Hip Add 2/5 2/5  Hip Ext 3/5 -3/5  Hip IR/ER 3/5 3/5  Knee Flex 4/5 4/5  Knee Ext 4/5 4/5  Ankle DF 4/5 4/5  Ankle PF 4/5 4/5   SENSATION: WNL BUE, BLE   FUNCTIONAL MOBILITY:  Independent with all mobility : transfers sit to stand, rolling left and right , side lying to prone ; with no visible grimacing consistent with 10/10 pain reports to RUE and 7/10 pain left hip and 8/10 right hip   BALANCE: WNL standing dynamic and static standing and no falls history   GAIT: Independent without AD and no antalgic gait present  for intermediate and short distances with reports of right knee pain during 6 MW test.   OUTCOME MEASURES: TEST Outcome Interpretation              6 minute walk test   1400             Feet 1000 feet is community ambulator                                    PT Education - 11/17/16 1540    Education provided Yes   Education Details plan of care   Person(s) Educated Patient   Methods Explanation   Comprehension Verbalized understanding             PT Long Term Goals - 11/17/16 1611      PT LONG TERM GOAL #1   Title Patient will increase six minute walk test distance to >1500 to be able to participate in community activities with her family.   Baseline 1400 feet 6MW test   Time 8   Period Weeks   Status New    Target Date  01/12/17     PT LONG TERM GOAL #2   Title Patient will report a worst pain of 3/10 on VAS in  shoulder and b hips  to improve tolerance with ADLs and reduced symptoms with activities.    Baseline 10/10 R shoulder ,  8/10 right, 7/10 left   Time 8   Period Weeks   Status New   Target Date 01/12/17     PT LONG TERM GOAL #3   Title Pt will be able to lay on her R side without interruption of pain in order to increase sleep quality.    Baseline difficulty with sidelying position during sleeping   Time 8   Period Weeks   Status New   Target Date 01/12/17     PT LONG TERM GOAL #5   Title --   Baseline --   Time --   Period --   Status --     PT LONG TERM GOAL #7   Title --   Baseline --   Time --   Period --   Status --                Plan - 11/17/16 1612    Clinical Impression Statement Pt is a 46 yo female who c/o B hip pain  R > L,  soreness in BUE R> L, that is constant and ranges from 7/10 to 10/10, and fatigue following completion of breast CA treatments (chemo/radiation) and current treatment of Tamoxifen. She has decreased strength in bilateral hips and BUE shoulders. These deficits impact her ability to walk, and sleep.. Pt's clinical presentations include pain, decreased strength, and decreased long distance ambulation.    History and Personal Factors relevant to plan of care: This patient presents with 1 personal factors/ comorbidities pain, and 1   body elements including body structures and functions, activity limitations and or participation restrictions : decreased strength B hips.  Patient's condition is stable,   Clinical Presentation Stable   Clinical Decision Making Low   Rehab Potential Good   PT Frequency 1x / week   PT Duration 8 weeks   PT Treatment/Interventions Aquatic Therapy;Therapeutic exercise;Therapeutic activities;Cryotherapy;Moist Heat;Manual techniques   Consulted and Agree with Plan of Care Patient      Patient will  benefit from skilled therapeutic intervention in order to improve the following deficits and impairments:  Decreased endurance, Difficulty walking, Decreased activity tolerance, Decreased strength, Impaired flexibility, Pain  Visit Diagnosis: Chronic right shoulder pain     Problem List Patient Active Problem List   Diagnosis Date Noted  . Obesity (BMI 35.0-39.9 without comorbidity) 03/20/2015  . Leg edema, right 10/03/2014  . Genetic testing 07/10/2014  . Breast cancer of lower-inner quadrant of right female breast ([place]) 05/29/2014    Alanson Puls, PT DPT 11/17/2016, 4:50 PM  [place] MAIN [facility] SERVICES [address] [place], [place], [address] Phone: [phone]   Fax:  [phone]  Name: Krystal Kelley MRN: [id number] Date of Birth: 03-May-1970

## 2016-11-17 NOTE — Progress Notes (Signed)
Patient brought F/O back in to be adjusted.  They were cut too short..Recover to size 10

## 2016-11-19 ENCOUNTER — Ambulatory Visit (INDEPENDENT_AMBULATORY_CARE_PROVIDER_SITE_OTHER): Payer: BLUE CROSS/BLUE SHIELD | Admitting: Licensed Clinical Social Worker

## 2016-11-19 DIAGNOSIS — F4310 Post-traumatic stress disorder, unspecified: Secondary | ICD-10-CM | POA: Diagnosis not present

## 2016-11-23 ENCOUNTER — Encounter (HOSPITAL_COMMUNITY): Payer: Self-pay | Admitting: Licensed Clinical Social Worker

## 2016-11-23 NOTE — Progress Notes (Signed)
   THERAPIST PROGRESS NOTE  Session Time: 11:10 - 12pm  Participation Level: Active  Behavioral Response: CasualAlertDepressed  Type of Therapy: Individual Therapy  Treatment Goals addressed: depression   Interventions:  CBT  Summary: Krystal Kelley is a 46 y.o. female who presents with PTSD.   Suicidal/Homicidal: Nowithout intent/plan  Therapist ResponseAllyson Sabal met with clinician for an individual session. She discussed her psychiatric symptoms and current life events. 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 Discussed pt's manifestation of stress. She reports she does not show her stress, as she doesn't want to appear weak. "I am a concrete thinker, there is no gray area." DIscussed this at length with pt using basic CBT concepts and the thought emotion connection and alternative perspectives. Taught pt emotional regulation skills where pt formulated healthier thoughts. Suggested to pt she watch Ted Talk [place]. She was open to the suggestion.    Plan: Return again in 3 weeks.  Diagnosis: Axis I: PTSD       Esias Mory S, LCAS 11/19/16

## 2016-11-25 ENCOUNTER — Ambulatory Visit: Payer: BLUE CROSS/BLUE SHIELD | Admitting: Physical Therapy

## 2016-11-25 ENCOUNTER — Ambulatory Visit (INDEPENDENT_AMBULATORY_CARE_PROVIDER_SITE_OTHER): Payer: BLUE CROSS/BLUE SHIELD | Admitting: Licensed Clinical Social Worker

## 2016-11-25 DIAGNOSIS — F4310 Post-traumatic stress disorder, unspecified: Secondary | ICD-10-CM | POA: Diagnosis not present

## 2016-11-25 DIAGNOSIS — R6889 Other general symptoms and signs: Secondary | ICD-10-CM

## 2016-11-25 DIAGNOSIS — R279 Unspecified lack of coordination: Secondary | ICD-10-CM

## 2016-11-25 DIAGNOSIS — M25611 Stiffness of right shoulder, not elsewhere classified: Secondary | ICD-10-CM

## 2016-11-25 DIAGNOSIS — M25511 Pain in right shoulder: Secondary | ICD-10-CM | POA: Diagnosis not present

## 2016-11-25 DIAGNOSIS — M25551 Pain in right hip: Secondary | ICD-10-CM

## 2016-11-25 DIAGNOSIS — M79673 Pain in unspecified foot: Secondary | ICD-10-CM

## 2016-11-25 DIAGNOSIS — I890 Lymphedema, not elsewhere classified: Secondary | ICD-10-CM

## 2016-11-25 DIAGNOSIS — G8929 Other chronic pain: Secondary | ICD-10-CM

## 2016-11-25 DIAGNOSIS — R2689 Other abnormalities of gait and mobility: Secondary | ICD-10-CM

## 2016-11-25 NOTE — Progress Notes (Signed)
   THERAPIST PROGRESS NOTE  Session Time: 11:10 - 12pm  Participation Level: Active  Behavioral Response: CasualAlertDepressed  Type of Therapy: Individual Therapy  Treatment Goals addressed: depression   Interventions:  CBT  Summary: Krystal Kelley is a 46 y.o. female who presents with PTSD.   Suicidal/Homicidal: Nowithout intent/plan  Therapist ResponseAllyson Sabal met with clinician for an individual session. She discussed her psychiatric symptoms and current life events Pt has restarted water therapy at [facility]. Pt discusses the stress shie encounters at home, she lives with her mother who runs a daycare out of her home. Discussed alternatives for pt. Pt identified her major stressor: "Things I cannot control." Discussed basic CBt concepts and the thought emotion connection and alternative persepectives. Pt was open to the concept. Explained process, purpose and practice of mindfulness techniques.Pt practiced technique in session. Gave pt handout and encouraged pt to use mindfulness daily.     Plan: Return again in 3 weeks.  Diagnosis: Axis I: PTSD       Maiko Salais S, LCAS 11/25/16

## 2016-11-25 NOTE — Therapy (Signed)
[place] MAIN [facility] SERVICES [address] [place], [place], [address] Phone: [phone]   Fax:  [phone]  Physical Therapy Treatment  Patient Details  Name: Krystal Kelley MRN: [id number] Date of Birth: 02/12/1971 Referring Provider: Nicholas Lose  Encounter Date: 11/25/2016      PT End of Session - 11/25/16 0854    Visit Number 2   Number of Visits 8   Date for PT Re-Evaluation 01/12/17   PT Start Time 0820   PT Stop Time 0850   PT Time Calculation (min) 30 min   Activity Tolerance Patient tolerated treatment well;Patient limited by pain   Behavior During Therapy [facility] for tasks assessed/performed      Past Medical History:  Diagnosis Date  . Anxiety   . Breast cancer, right breast ([place])   . Depression   . Peripheral edema    on lasix  . Radiation 10/30/14-12/17/14   right breast 50.4 gray    Past Surgical History:  Procedure Laterality Date  . BREAST BIOPSY  05/16/14  . BREAST LUMPECTOMY Right 06/18/2014  . bunion surgrery    . CARPAL TUNNEL RELEASE Left 09/21/2016   Procedure: LEFT CARPAL TUNNEL RELEASE;  Surgeon: Daryll Brod, MD;  Location: [place];  Service: Orthopedics;  Laterality: Left;  REG/FAB  . carpel tunnel    . CHOLECYSTECTOMY  2015  . PLANTAR FASCIECTOMY    . PORTACATH PLACEMENT Left 06/18/2014   Procedure: INSERTION PORT-A-CATH;  Surgeon: Excell Seltzer, MD;  Location: [place];  Service: General;  Laterality: Left;    There were no vitals filed for this visit.      Subjective Assessment - 11/25/16 0852    Subjective Pt reported she has had joint pain in arms and feet.                      Adult Aquatic Therapy - 11/25/16 0856      Aquatic Therapy Subjective   Subjective Pt had no complaints.         O: Pt entered/exited the pool via steps with single UE support on rail.  50 ft =1 lap  Exercises performed in 4'0" depth   4 laps walking  4  laps walking with noodles pressed down in circles ( shoulder ext and depression)  4 laps with kick board, breast-stroke/ freestyle kicks   Stretches: wall   2' holding to wall, noodle under thighs, leg circles/ flutterkicks   Blowing bubbles    cool down                  PT Long Term Goals - 11/17/16 1611      PT LONG TERM GOAL #1   Title Patient will increase six minute walk test distance to >1500 to be able to participate in community activities with her family.   Baseline 1400 feet 6MW test   Time 8   Period Weeks   Status New   Target Date 01/12/17     PT LONG TERM GOAL #2   Title Patient will report a worst pain of 3/10 on VAS in  shoulder and b hips  to improve tolerance with ADLs and reduced symptoms with activities.    Baseline 10/10 R shoulder ,  8/10 right, 7/10 left   Time 8   Period Weeks   Status New   Target Date 01/12/17     PT LONG TERM GOAL #3   Title  Pt will be able to lay on her R side without interruption of pain in order to increase sleep quality.    Baseline difficulty with sidelying position during sleeping   Time 8   Period Weeks   Status New   Target Date 01/12/17     PT LONG TERM GOAL #5   Title --   Baseline --   Time --   Period --   Status --     PT LONG TERM GOAL #7   Title --   Baseline --   Time --   Period --   Status --               Plan - 11/25/16 0854    Clinical Impression Statement Pt had no complaints today with aquatic Tx. Session was limited to 30 min to minimize fatigue because today was pt's first session. Pt continues to benefit from skilled PT.    Rehab Potential Good   PT Frequency 1x / week   PT Duration 8 weeks   PT Treatment/Interventions Aquatic Therapy;Therapeutic exercise;Therapeutic activities;Cryotherapy;Moist Heat;Manual techniques   Consulted and Agree with Plan of Care Patient      Patient will benefit from skilled therapeutic intervention in order to improve the following  deficits and impairments:  Decreased endurance, Difficulty walking, Decreased activity tolerance, Decreased strength, Impaired flexibility, Pain  Visit Diagnosis: Chronic right shoulder pain  Other abnormalities of gait and mobility  Unspecified lack of coordination  Pain in right hip  Lymphedema, not elsewhere classified  Stiffness of right shoulder, not elsewhere classified  Impaired function of upper extremity     Problem List Patient Active Problem List   Diagnosis Date Noted  . Obesity (BMI 35.0-39.9 without comorbidity) 03/20/2015  . Leg edema, right 10/03/2014  . Genetic testing 07/10/2014  . Breast cancer of lower-inner quadrant of right female breast ([place]) 05/29/2014    [name] ,PT, DPT, E-RYT  11/25/2016, 8:58 AM  [place] MAIN [facility] SERVICES [address] [place], [place], [address] Phone: [phone]   Fax:  [phone]  Name: Krystal Kelley MRN: [id number] Date of Birth: [birth year]

## 2016-11-29 ENCOUNTER — Encounter (HOSPITAL_COMMUNITY): Payer: Self-pay | Admitting: Licensed Clinical Social Worker

## 2016-12-01 ENCOUNTER — Ambulatory Visit: Payer: BLUE CROSS/BLUE SHIELD | Admitting: Orthotics

## 2016-12-01 DIAGNOSIS — M722 Plantar fascial fibromatosis: Secondary | ICD-10-CM

## 2016-12-01 NOTE — Progress Notes (Signed)
Patient p/u foot orthotics that were too short...fit good in shoes..no charge

## 2016-12-02 ENCOUNTER — Ambulatory Visit (HOSPITAL_COMMUNITY): Payer: Self-pay | Admitting: Licensed Clinical Social Worker

## 2016-12-02 ENCOUNTER — Ambulatory Visit: Payer: BLUE CROSS/BLUE SHIELD | Admitting: Physical Therapy

## 2016-12-02 DIAGNOSIS — R531 Weakness: Secondary | ICD-10-CM

## 2016-12-02 DIAGNOSIS — R2689 Other abnormalities of gait and mobility: Secondary | ICD-10-CM

## 2016-12-02 DIAGNOSIS — M25611 Stiffness of right shoulder, not elsewhere classified: Secondary | ICD-10-CM

## 2016-12-02 DIAGNOSIS — R6889 Other general symptoms and signs: Secondary | ICD-10-CM

## 2016-12-02 DIAGNOSIS — M25551 Pain in right hip: Secondary | ICD-10-CM

## 2016-12-02 DIAGNOSIS — I890 Lymphedema, not elsewhere classified: Secondary | ICD-10-CM

## 2016-12-02 DIAGNOSIS — M25511 Pain in right shoulder: Secondary | ICD-10-CM | POA: Diagnosis not present

## 2016-12-02 DIAGNOSIS — G8929 Other chronic pain: Secondary | ICD-10-CM

## 2016-12-02 DIAGNOSIS — R279 Unspecified lack of coordination: Secondary | ICD-10-CM

## 2016-12-02 IMAGING — MR MR BREAST BILATERAL W WO CONTRAST
8 of 11 series · 31 of 48 positions shown · IV contrast (multihance)
Comparison: Recent mammogram, ultrasound and biopsy examinations [facility].

CLINICAL DATA: Recently diagnosed invasive ductal carcinoma and
ductal carcinoma in situ in the lower inner quadrant of the right
breast.

LABS:  None obtained today.
EXAM:
BILATERAL BREAST MRI WITH AND WITHOUT CONTRAST
TECHNIQUE: Multiplanar, multisequence MR images of both breasts were obtained
prior to and following the intravenous administration of 20 ml of
MultiHance.

[Series 2: T2 · axial · 3.0mm · 0.99mm/px · z∈[-51,+111]mm · 3 of 55 slices shown]
[im 1/55]
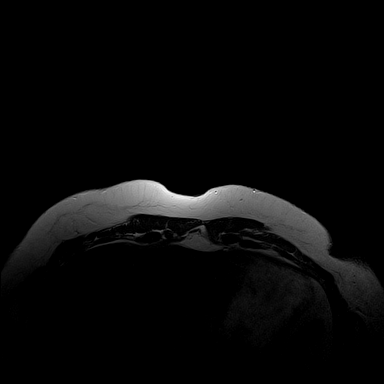
[im 28/55]
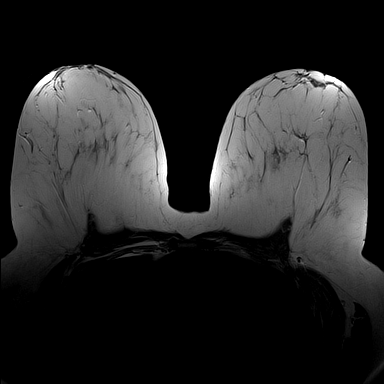
[im 55/55]
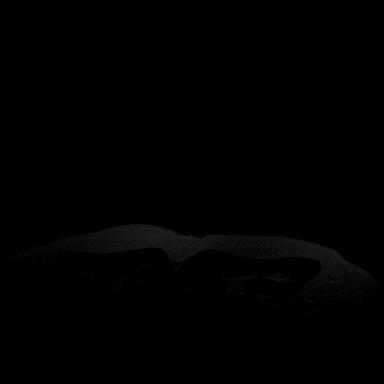

[Series 3: t2_tirm_tra ipat (a-p) · axial · 3.0mm · 0.74mm/px · z∈[-51,+111]mm · 3 of 55 slices shown]
[im 1/55]
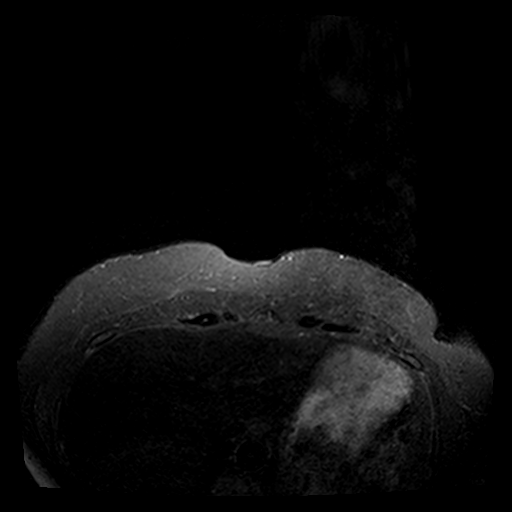
[im 28/55]
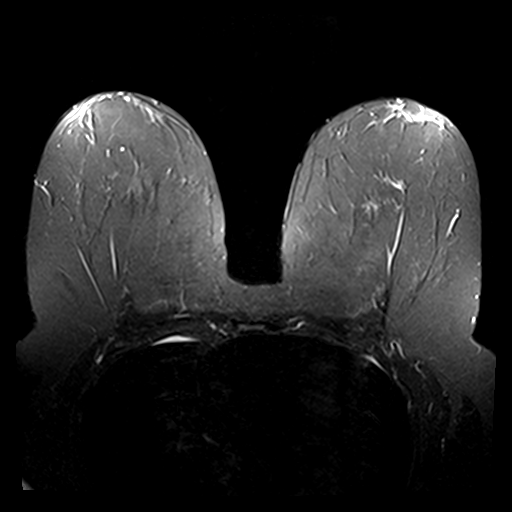
[im 55/55]
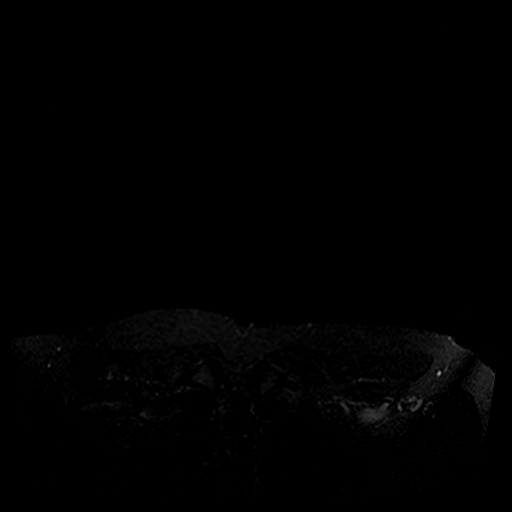

[Series 4: fl3d pre-cm no · axial · non-contrast · 1.2mm · 0.99mm/px · z∈[-56,+115]mm · 6 of 144 slices shown]
[im 1/144]
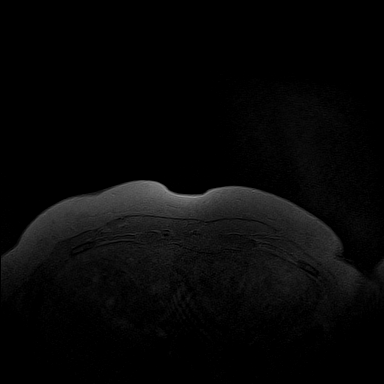
[im 29/144]
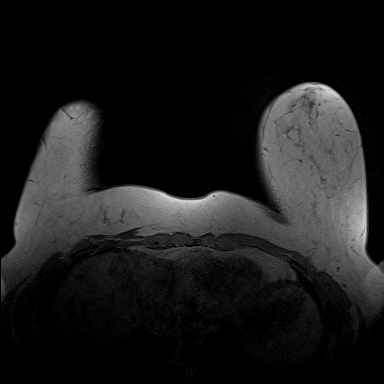
[im 58/144]
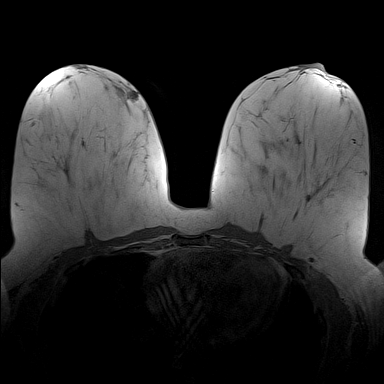
[im 86/144]
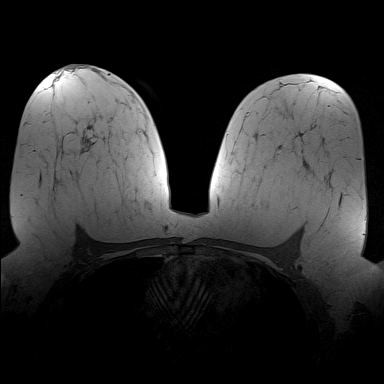
[im 115/144]
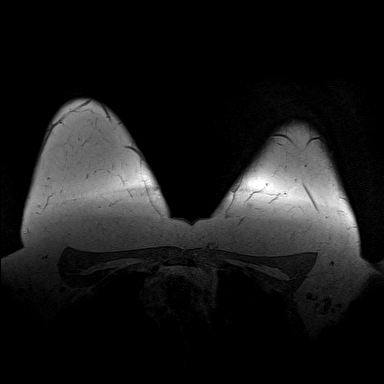
[im 144/144]
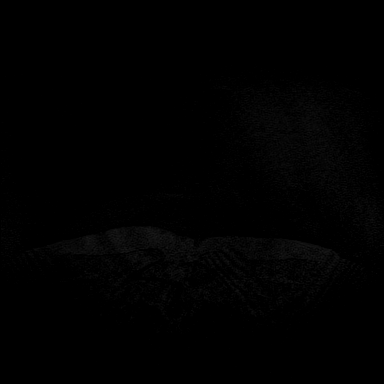

[Series 5: fl3d pre-cm · axial · non-contrast · 1.2mm · 0.99mm/px · z∈[-56,+115]mm · 5 of 144 slices shown]
[im 1/144]
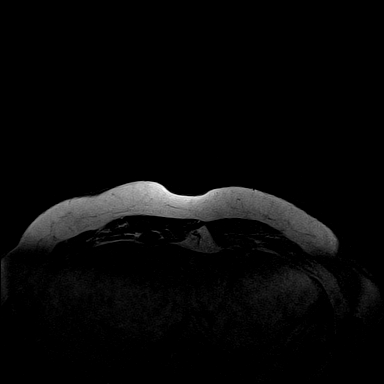
[im 36/144]
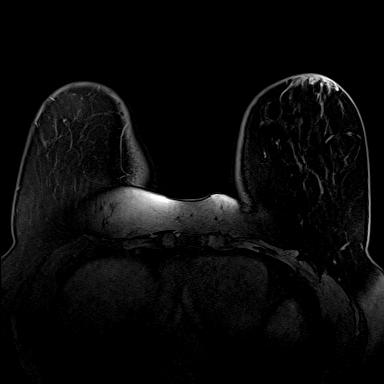
[im 72/144]
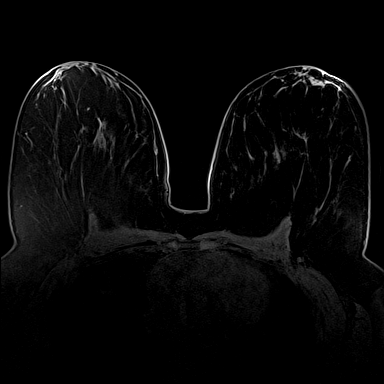
[im 108/144]
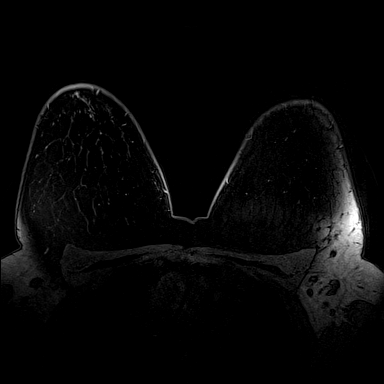
[im 144/144]
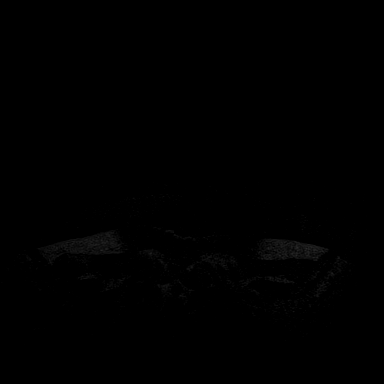

[Series 6: fl3d post-cm 20 · axial · 1.2mm · 0.99mm/px · z∈[-56,+115]mm · 5 of 144 slices shown (1 of 3)]
[im 1/144]
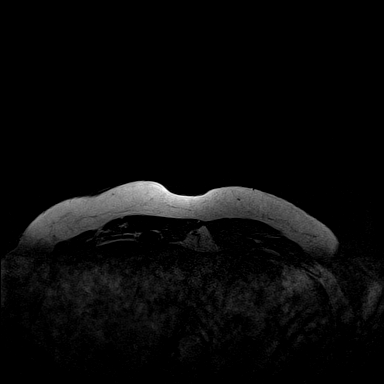
[im 36/144]
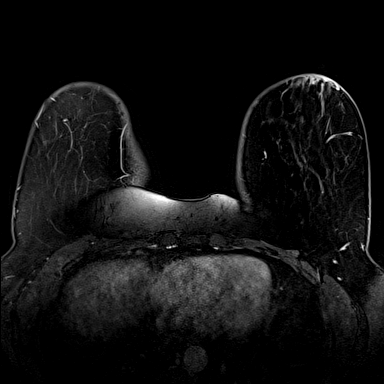
[im 72/144]
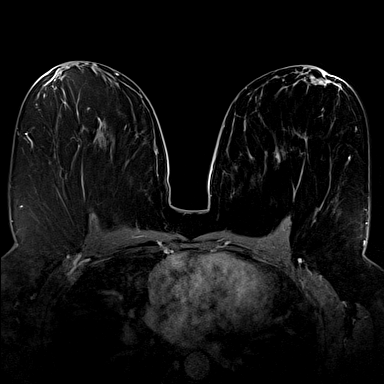
[im 108/144]
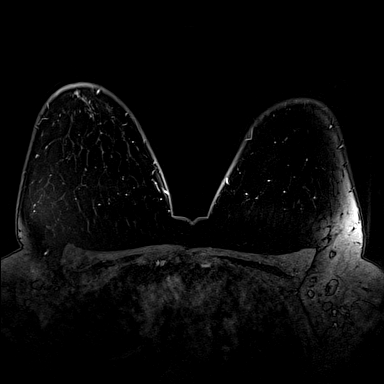
[im 144/144]
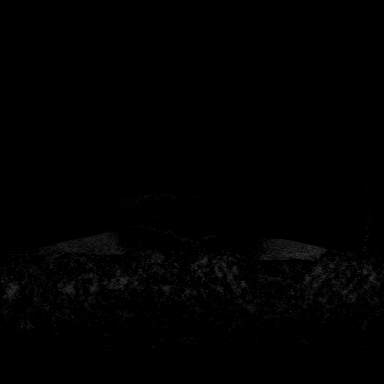

[Series 7: fl3d post-cm 20 · axial · 1.2mm · 0.99mm/px · z∈[-56,+115]mm · 5 of 144 slices shown (2 of 3)]
[im 1/144]
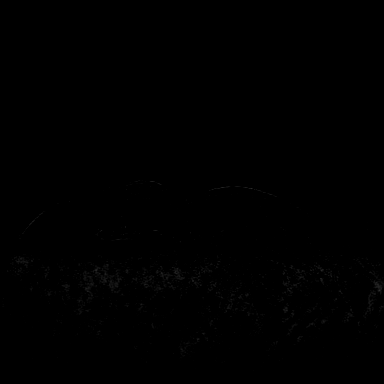
[im 36/144]
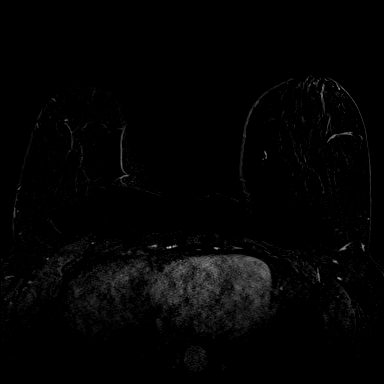
[im 72/144]
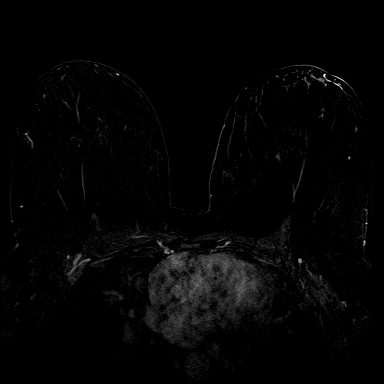
[im 108/144]
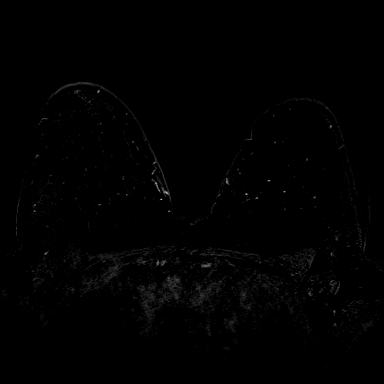
[im 144/144]
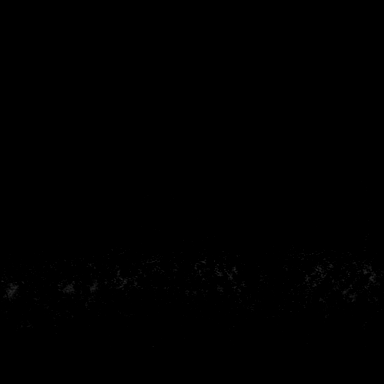

[Series 8: fl3d post-cm 20 · axial · 172.8mm · 0.99mm/px · 1 of 1 slices shown (3 of 3)]
[im 1/1]
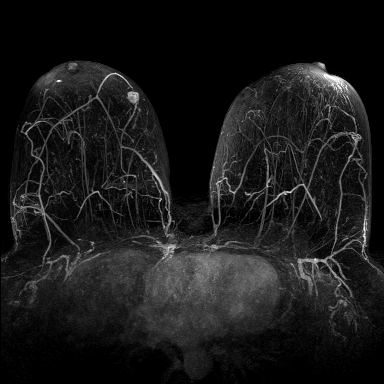

[Series 9: fl3d post-cm 3min · axial · 1.2mm · 0.99mm/px · z∈[-56,+29]mm · 3 of 144 slices shown]
[im 1/144]
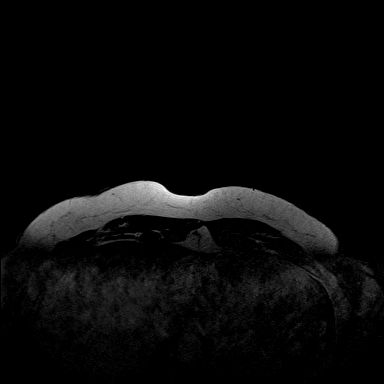
[im 36/144]
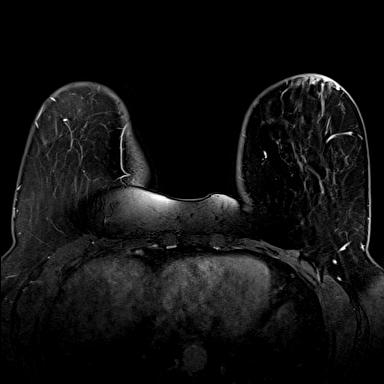
[im 72/144]
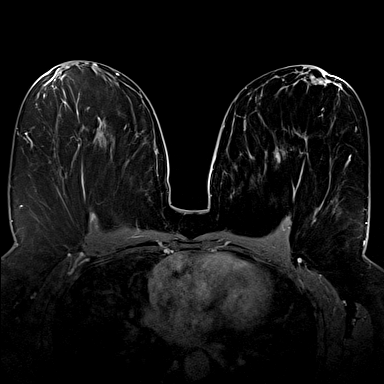

[31 of 48 positions shown; findings below may reference images not displayed]

THREE-DIMENSIONAL MR IMAGE RENDERING ON INDEPENDENT WORKSTATION:

Three-dimensional MR images were rendered by post-processing of the
original MR data on an independent workstation. The
three-dimensional MR images were interpreted, and findings are
reported in the following complete MRI report for this study. Three
dimensional images were evaluated at the independent [employer]
workstation
FINDINGS: Breast composition: b.  Scattered fibroglandular tissue.

Background parenchymal enhancement: Minimal

Right breast: 1.4 x 1.2 x 1.0 cm oval, circumscribed, enhancing mass
containing a biopsy marker clip artifact in the anterior aspect of
the lower inner quadrant. This has predominantly plateau enhancement
kinetics.

Left breast: No mass or abnormal enhancement.

Lymph nodes: No abnormal appearing lymph nodes.

Ancillary findings:  None.
IMPRESSION: 1.4 x 1.2 x 1.0 cm biopsy-proven invasive ductal carcinoma and
ductal carcinoma in situ in the lower inner quadrant of the right
breast. Otherwise, unremarkable examination.

RECOMMENDATION:
Treatment plan.

BI-RADS CATEGORY  6: Known biopsy-proven malignancy.

## 2016-12-02 NOTE — Therapy (Signed)
[place] MAIN [facility] SERVICES [address] [place], [place], [address] Phone: [phone]   Fax:  [phone]  Physical Therapy Treatment  Patient Details  Name: ERNESTYNE CALDWELL MRN: [id number] Date of Birth: December 05, 1970 Referring Provider: Nicholas Lose  Encounter Date: 12/02/2016      PT End of Session - 12/02/16 0828    Visit Number 3   Number of Visits 8   Date for PT Re-Evaluation 01/12/17   PT Start Time 0825   PT Stop Time 0900   PT Time Calculation (min) 35 min   Activity Tolerance Patient tolerated treatment well;Patient limited by pain   Behavior During Therapy [facility] for tasks assessed/performed      Past Medical History:  Diagnosis Date  . Anxiety   . Breast cancer, right breast ([place])   . Depression   . Peripheral edema    on lasix  . Radiation 10/30/14-12/17/14   right breast 50.4 gray    Past Surgical History:  Procedure Laterality Date  . BREAST BIOPSY  05/16/14  . BREAST LUMPECTOMY Right 06/18/2014  . bunion surgrery    . CARPAL TUNNEL RELEASE Left 09/21/2016   Procedure: LEFT CARPAL TUNNEL RELEASE;  Surgeon: Daryll Brod, MD;  Location: [place];  Service: Orthopedics;  Laterality: Left;  REG/FAB  . carpel tunnel    . CHOLECYSTECTOMY  2015  . PLANTAR FASCIECTOMY    . PORTACATH PLACEMENT Left 06/18/2014   Procedure: INSERTION PORT-A-CATH;  Surgeon: Excell Seltzer, MD;  Location: [place];  Service: General;  Laterality: Left;    There were no vitals filed for this visit.      Subjective Assessment - 12/02/16 0824    Subjective Pt reported she overslept this morning and arrived late. Pt reported she felt better with the pain after last session. But pt still has trouble sleeping to cramping in her feet.                       Adult Aquatic Therapy - 12/02/16 0828      Aquatic Therapy Subjective   Subjective Pt had no complaints.         O: Pt entered/exited  the pool via steps with single UE support on rail.  50 ft =1 lap  Exercises performed in 4'6" depth   2  laps walking  2  laps walking back wards with noodles Shoulder ER   Exercises performed in 3'6" depth   2 laps sidestepping, mini squat, noodles in underwater, shoulder abd/add     Stretches: wall   Exercises performed in 4'6" depth   2 laps with kick board,  flutterkicks . Pt reported her leg hurting after 3rd lap and then practiced less effort and had no pain.   3 laps with breast stroke , 1 laps with practice breathing under, blowing bubbles      cool down  , relaxation on noodles 5'                    PT Long Term Goals - 11/17/16 1611      PT LONG TERM GOAL #1   Title Patient will increase six minute walk test distance to >1500 to be able to participate in community activities with her family.   Baseline 1400 feet 6MW test   Time 8   Period Weeks   Status New   Target Date 01/12/17     PT LONG TERM  GOAL #2   Title Patient will report a worst pain of 3/10 on VAS in  shoulder and b hips  to improve tolerance with ADLs and reduced symptoms with activities.    Baseline 10/10 R shoulder ,  8/10 right, 7/10 left   Time 8   Period Weeks   Status New   Target Date 01/12/17     PT LONG TERM GOAL #3   Title Pt will be able to lay on her R side without interruption of pain in order to increase sleep quality.    Baseline difficulty with sidelying position during sleeping   Time 8   Period Weeks   Status New   Target Date 01/12/17     PT LONG TERM GOAL #5   Title --   Baseline --   Time --   Period --   Status --     PT LONG TERM GOAL #7   Title --   Baseline --   Time --   Period --   Status --               Plan - 12/02/16 0829    Clinical Impression Statement Pt progressed to more aerobic and strengthening exercises at all depths of the pool today. Pt as educated to perform leg elevation with ankle circles and ankle  pumps prior to bed as a way to minimize cramp and increase circulation for improved sleep. Pt was also educated to incorporate 10 min leisure walking for circulation and leg elevation after long periods of being on her feet.  Pt voiced understanding. Pt continues to benefit from skilled PT.    Rehab Potential Good   PT Frequency 1x / week   PT Duration 8 weeks   PT Treatment/Interventions Aquatic Therapy;Therapeutic exercise;Therapeutic activities;Cryotherapy;Moist Heat;Manual techniques   Consulted and Agree with Plan of Care Patient      Patient will benefit from skilled therapeutic intervention in order to improve the following deficits and impairments:  Decreased endurance, Difficulty walking, Decreased activity tolerance, Decreased strength, Impaired flexibility, Pain  Visit Diagnosis: Chronic right shoulder pain  Decreased ROM of right shoulder  Other abnormalities of gait and mobility  Unspecified lack of coordination  Pain in right hip  Lymphedema, not elsewhere classified  Stiffness of right shoulder, not elsewhere classified  Impaired function of upper extremity  Decreased strength  Right shoulder pain, unspecified chronicity     Problem List Patient Active Problem List   Diagnosis Date Noted  . Obesity (BMI 35.0-39.9 without comorbidity) 03/20/2015  . Leg edema, right 10/03/2014  . Genetic testing 07/10/2014  . Breast cancer of lower-inner quadrant of right female breast ([place]) 05/29/2014    [name] .sypt 12/02/2016, 09:00 AM  [place] MAIN [facility] SERVICES [address] [place], [place], [address] Phone: [phone]   Fax:  [phone]  Name: ABBEGAIL MATUSKA MRN: [id number] Date of Birth: [birth year]

## 2016-12-09 ENCOUNTER — Ambulatory Visit: Payer: BLUE CROSS/BLUE SHIELD

## 2016-12-09 ENCOUNTER — Ambulatory Visit (HOSPITAL_COMMUNITY): Payer: BLUE CROSS/BLUE SHIELD | Admitting: Licensed Clinical Social Worker

## 2016-12-09 ENCOUNTER — Encounter (HOSPITAL_COMMUNITY): Payer: Self-pay | Admitting: Licensed Clinical Social Worker

## 2016-12-09 DIAGNOSIS — R279 Unspecified lack of coordination: Secondary | ICD-10-CM

## 2016-12-09 DIAGNOSIS — M25511 Pain in right shoulder: Secondary | ICD-10-CM

## 2016-12-09 DIAGNOSIS — R531 Weakness: Secondary | ICD-10-CM

## 2016-12-09 DIAGNOSIS — R6889 Other general symptoms and signs: Secondary | ICD-10-CM

## 2016-12-09 DIAGNOSIS — R2689 Other abnormalities of gait and mobility: Secondary | ICD-10-CM

## 2016-12-09 DIAGNOSIS — M25551 Pain in right hip: Secondary | ICD-10-CM

## 2016-12-09 DIAGNOSIS — I890 Lymphedema, not elsewhere classified: Secondary | ICD-10-CM

## 2016-12-09 DIAGNOSIS — F4310 Post-traumatic stress disorder, unspecified: Secondary | ICD-10-CM | POA: Insufficient documentation

## 2016-12-09 DIAGNOSIS — M25611 Stiffness of right shoulder, not elsewhere classified: Secondary | ICD-10-CM

## 2016-12-09 DIAGNOSIS — G8929 Other chronic pain: Secondary | ICD-10-CM

## 2016-12-09 NOTE — Therapy (Signed)
Pt entered/exited pool via steps Pt participates in the following:  Fwd/Bkwd slow ambulation with kick boards, 4 laps Wall calf/soleus stretch, B 3 x 15 secons Sideways walk with kick boards, slow 4 laps Step calf and hamstrings stretching at step, 3 x 15 sec each Bench bike, 5 min Wall core work in squat position, hands closed, sh horizontal abd/add, sh flex/ext, 30x eac Mild speed Back stretch at rail, straight, R/L 3 x 15 sec Assisted hip flexor/quad stretch at rail, 3 x 15 sec each

## 2016-12-09 NOTE — Progress Notes (Signed)
   THERAPIST PROGRESS NOTE  Session Time: 11:20 - 12:05  Participation Level: Active  Behavioral Response: CasualAlertDepressed  Type of Therapy: Individual Therapy  Treatment Goals addressed:    Interventions:  CBT  Summary: Krystal Kelley is a 46 y.o. female who presents with PTSD.   Suicidal/Homicidal: Nowithout intent/plan  Therapist ResponseAllyson Sabal met with clinician for an individual session. She discussed her psychiatric symptoms and current life events. Discussed diagnosis and symptoms of PtSD with pt. Gave pt informational sheet on PTSD and discussed it. Asked pt open ended questions about her thoughts about the diagnosis. Asked pt about her use of mindfulness that was practiced last session and pt has not used the techniques. Suggested to pt to use meditation. Pt has a lot of excuses about "nothing works for me." Pt explained she is having a lot of time waking up from her dreams in the night. She doesn't sleep well. Pt has appt with psychiatrist next week and suggested she speak with him about these symptoms.       Plan: Return again in 2 weeks.  Diagnosis: Axis I: PTSD       MACKENZIE,LISBETH S, LCAS 12/09/16

## 2016-12-16 ENCOUNTER — Ambulatory Visit (INDEPENDENT_AMBULATORY_CARE_PROVIDER_SITE_OTHER): Payer: BLUE CROSS/BLUE SHIELD | Admitting: Licensed Clinical Social Worker

## 2016-12-16 ENCOUNTER — Encounter (HOSPITAL_COMMUNITY): Payer: Self-pay | Admitting: Licensed Clinical Social Worker

## 2016-12-16 ENCOUNTER — Ambulatory Visit: Payer: BLUE CROSS/BLUE SHIELD

## 2016-12-16 DIAGNOSIS — I890 Lymphedema, not elsewhere classified: Secondary | ICD-10-CM

## 2016-12-16 DIAGNOSIS — M25511 Pain in right shoulder: Secondary | ICD-10-CM

## 2016-12-16 DIAGNOSIS — F4310 Post-traumatic stress disorder, unspecified: Secondary | ICD-10-CM | POA: Diagnosis not present

## 2016-12-16 DIAGNOSIS — R279 Unspecified lack of coordination: Secondary | ICD-10-CM

## 2016-12-16 DIAGNOSIS — M25611 Stiffness of right shoulder, not elsewhere classified: Secondary | ICD-10-CM

## 2016-12-16 DIAGNOSIS — R531 Weakness: Secondary | ICD-10-CM

## 2016-12-16 DIAGNOSIS — M25551 Pain in right hip: Secondary | ICD-10-CM

## 2016-12-16 DIAGNOSIS — G8929 Other chronic pain: Secondary | ICD-10-CM

## 2016-12-16 DIAGNOSIS — R6889 Other general symptoms and signs: Secondary | ICD-10-CM

## 2016-12-16 NOTE — Progress Notes (Signed)
   THERAPIST PROGRESS NOTE  Session Time: 11:20 - 12:05  Participation Level: Active  Behavioral Response: CasualAlertDepressed  Type of Therapy: Individual Therapy  Treatment Goals addressed: Depressive symptoms   Interventions:  CBT  Summary: Krystal Kelley is a 46 y.o. female who presents with PTSD.   Suicidal/Homicidal: Nowithout intent/plan  Therapist ResponseAllyson Kelley met with clinician for an individual session. She discussed her psychiatric symptoms and current life events. Pt presented upset today. He x husband is getting remarried. She had a lot of pent up feelings about her marriage and him. Asked open ended questions. Discussed with pt basic CBT concepts and the thought emotion connection and alternative perspectives. Pt reported she has been using meditation "sometimes" Suggested to pt to complete a gratitude list to remind herself of all the things she has to feel grateful for.   Plan: Return again in 2 weeks.  Diagnosis: Axis I: PTSD       Krystal Kelley, LCAS 12/16/16

## 2016-12-16 NOTE — Therapy (Signed)
Pt session limited to 35 minutes due to pt late arrival and pt schedule following appointment.  Pt enters/exits pool via steps. Exercises:  4 laps fwd ambulation, long stride with arm reach through water. Moderate speed Wall core strength/UE strength: triceps press down, green dumbbells; sh flex/ext, abd/add and horiz abd/add, 20x ea Mini squat, 20x 4 laps side stepping with arms, long stride; mod speed Back stretching at rail; mid, R/L; hamstrings B, quadriceps B 3 x 15 seconds each.

## 2016-12-22 ENCOUNTER — Ambulatory Visit (HOSPITAL_COMMUNITY): Payer: Self-pay | Admitting: Licensed Clinical Social Worker

## 2016-12-23 ENCOUNTER — Ambulatory Visit: Payer: BLUE CROSS/BLUE SHIELD

## 2016-12-23 IMAGING — CR DG CHEST 1V PORT
1 series · 1 of 1 positions shown · non-contrast
Comparison: None.

CLINICAL DATA: Left Port-A-Cath placement, left breast cancer

EXAM:
PORTABLE CHEST - 1 VIEW

[AP]
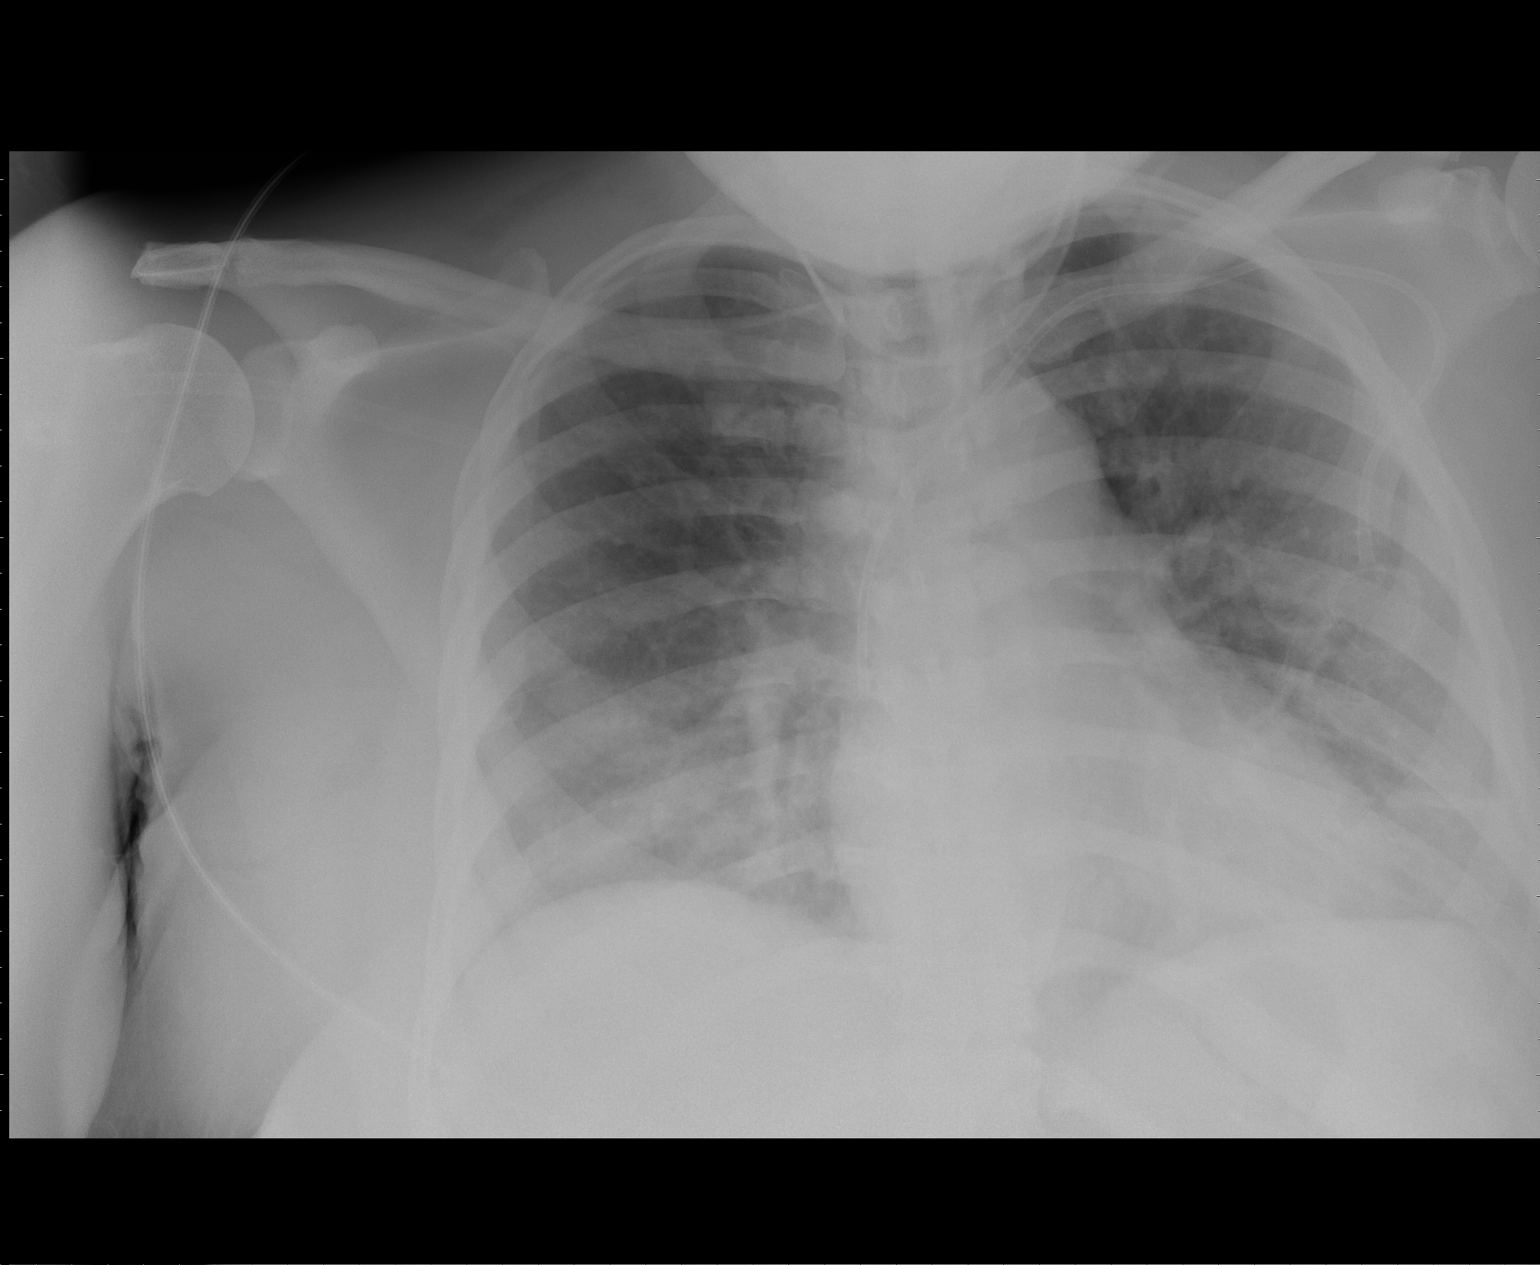

[1 of 1 positions shown; findings below may reference images not displayed]

FINDINGS: Cardiomediastinal silhouette is unremarkable. There is a left
subclavian Port-A-Cath with tip in SVC right atrium junction. No
pneumothorax. Mild basilar atelectasis. No pulmonary edema.
IMPRESSION: Left subclavian Port-A-Cath in place. No pneumothorax. Mild basilar
atelectasis.

## 2016-12-29 ENCOUNTER — Encounter (HOSPITAL_COMMUNITY): Payer: Self-pay | Admitting: Licensed Clinical Social Worker

## 2016-12-29 ENCOUNTER — Ambulatory Visit (INDEPENDENT_AMBULATORY_CARE_PROVIDER_SITE_OTHER): Payer: BLUE CROSS/BLUE SHIELD | Admitting: Licensed Clinical Social Worker

## 2016-12-29 DIAGNOSIS — F4310 Post-traumatic stress disorder, unspecified: Secondary | ICD-10-CM | POA: Diagnosis not present

## 2016-12-29 NOTE — Progress Notes (Signed)
   THERAPIST PROGRESS NOTE  Session Time: 11:20 - 12:05  Participation Level: Active  Behavioral Response: Casual/ Upbeat  Type of Therapy: Individual Therapy  Treatment Goals addressed: Depressive symptoms   Interventions:  CBT  Summary: Krystal Kelley is a 46 y.o. female who presents with PTSD.   Suicidal/Homicidal: Nowithout intent/plan  Therapist ResponseAllyson Sabal met with clinician for an individual session. She discussed her psychiatric symptoms and current life events. Pt reports her depressive symptoms have "leveled." She wanted to discuss in session today. The walls she keeps up so no one can know her." Taught pt about vulnerability. Went over handout. Pt has been told she is aggressive and intimidating. Taught pt effective communication skills. I statements, tone of voice. Suggested to pt to journal and focus on her voice in communication and to write down what she notices and what she's told before her next appt.    Plan: Return again in 2 weeks.  Diagnosis: Axis I: PTSD       Camdyn Beske S, LCAS 12/29/16

## 2016-12-30 ENCOUNTER — Ambulatory Visit: Payer: BLUE CROSS/BLUE SHIELD | Attending: Hematology and Oncology

## 2016-12-30 DIAGNOSIS — R279 Unspecified lack of coordination: Secondary | ICD-10-CM | POA: Diagnosis present

## 2016-12-30 DIAGNOSIS — M25551 Pain in right hip: Secondary | ICD-10-CM | POA: Insufficient documentation

## 2016-12-30 DIAGNOSIS — G8929 Other chronic pain: Secondary | ICD-10-CM | POA: Diagnosis present

## 2016-12-30 DIAGNOSIS — M25611 Stiffness of right shoulder, not elsewhere classified: Secondary | ICD-10-CM | POA: Diagnosis present

## 2016-12-30 DIAGNOSIS — M6281 Muscle weakness (generalized): Secondary | ICD-10-CM | POA: Insufficient documentation

## 2016-12-30 DIAGNOSIS — R6889 Other general symptoms and signs: Secondary | ICD-10-CM | POA: Diagnosis present

## 2016-12-30 DIAGNOSIS — I890 Lymphedema, not elsewhere classified: Secondary | ICD-10-CM | POA: Insufficient documentation

## 2016-12-30 DIAGNOSIS — M25511 Pain in right shoulder: Secondary | ICD-10-CM | POA: Diagnosis present

## 2016-12-30 DIAGNOSIS — R531 Weakness: Secondary | ICD-10-CM | POA: Diagnosis present

## 2016-12-30 DIAGNOSIS — R2689 Other abnormalities of gait and mobility: Secondary | ICD-10-CM | POA: Insufficient documentation

## 2016-12-30 NOTE — Therapy (Signed)
Pt entered/exited pool via steps Performs following exercises: Fwd ambulation, focus long strides, core stabilized and upright posture, 4 laps Side ambulation, focus long strides, core stabilized and supright posture, palms down with arm swing, 4 laps Wall squat core stabilization with UE flex/ext, abd/add, horizontal abd/add with moderate speed, 25x each Triceps press down in stabilized wall squat position, green dumbells, 20x Minisquat, 25x Corner bicycle and hip abd/add 5 min each mild speed with core stabilized. Rest breaks as needed. Corner DKTC 5 sets of 10 Low back stretching at rail, 3 positions (R. L, center) 3 x 15 seconds each Waist deep B alternating step run, 3 minutes each, mild speed B hamstrings stretching at step, 3 x 15 seconds each B gastroc step stretch, 3 x 15 seconds each.

## 2017-01-03 ENCOUNTER — Telehealth: Payer: Self-pay

## 2017-01-03 NOTE — Telephone Encounter (Signed)
Pt called feeling very upset. Pt received a letter noting that she no longer qualifies for disability and that her income will stop. Pt needs to see Dr. Lindi Adie right away in order to get his medical opinion on whether or not she is approved to go back to work. Pt stated that the letter states, pt is capable of working in a sedentary position where it physical activities is limited. Pt feels that she is unable to work as of now, but needs Dr.Gudena right away to assess and see if pt is fit to work or not. Pt needs a letter as soon as possible to send to her disability company. Will have pt come in tomorrow to see Dr.Gudena for further discussion.Pt verified appt time/date.

## 2017-01-04 ENCOUNTER — Ambulatory Visit (HOSPITAL_BASED_OUTPATIENT_CLINIC_OR_DEPARTMENT_OTHER): Payer: BLUE CROSS/BLUE SHIELD | Admitting: Hematology and Oncology

## 2017-01-04 ENCOUNTER — Telehealth: Payer: Self-pay | Admitting: Hematology and Oncology

## 2017-01-04 DIAGNOSIS — R5383 Other fatigue: Secondary | ICD-10-CM | POA: Diagnosis not present

## 2017-01-04 DIAGNOSIS — C50311 Malignant neoplasm of lower-inner quadrant of right female breast: Secondary | ICD-10-CM

## 2017-01-04 DIAGNOSIS — F419 Anxiety disorder, unspecified: Secondary | ICD-10-CM | POA: Diagnosis not present

## 2017-01-04 DIAGNOSIS — Z170 Estrogen receptor positive status [ER+]: Secondary | ICD-10-CM | POA: Diagnosis not present

## 2017-01-04 DIAGNOSIS — E669 Obesity, unspecified: Secondary | ICD-10-CM

## 2017-01-04 DIAGNOSIS — N951 Menopausal and female climacteric states: Secondary | ICD-10-CM

## 2017-01-04 DIAGNOSIS — M791 Myalgia: Secondary | ICD-10-CM

## 2017-01-04 NOTE — Progress Notes (Signed)
Patient Care Team: Albina Billet, MD as PCP - General (Internal Medicine) Albina Billet, MD (Internal Medicine) Nicholas Lose, MD as Consulting Physician (Hematology and Oncology) Excell Seltzer, MD as Consulting Physician (General Surgery) Gery Pray, MD as Consulting Physician (Radiation Oncology) Delice Bison Charlestine Massed, NP as Nurse Practitioner (Hematology and Oncology)  DIAGNOSIS:  Encounter Diagnosis  Name Primary?  . Malignant neoplasm of lower-inner quadrant of right breast of female, estrogen receptor positive ([place])     SUMMARY OF ONCOLOGIC HISTORY:   Breast cancer of lower-inner quadrant of right female breast ([place])   05/14/2014 Mammogram    Right mammogram: 1.5 cm round mass in right breast at 3:00      05/14/2014 Breast US    Right breast: irregular, hypoechoic mass with indistinct margins at 3:00 mearying 1.1 x 0.8 x 1.3 cm.      05/16/2014 Initial Biopsy    Ultrasound-guided biopsy: Invasive ductal carcinoma with DCIS, grade 1-2, ER/PR positive, Ki-67 43%, HER-2 positive      05/28/2014 Breast MRI    1.4 x 1.2 x 1.0 cm biopsy-proven invasive ductal carcinoma and ductal carcinoma in situ in the lower inner quadrant of the right breast.       05/28/2014 Clinical Stage    Stage IA: T1c N0      06/13/2014 Procedure    Breast/Next: Variant of Unknown Significance in the ATM gene called p.[id number]>T, p.[id number]. Otherwise, no clinically significant mutation at ATM, BARD1, BRCA1, BRCA2, BRIP1, CDH1, CHEK2, MRE11A, MUTYH, NBN, NF1, PALB2, PTEN, RAD50, RAD51C, RAD51D, and TP53      06/18/2014 Surgery    Right breast lumpectomy: Invasive ductal carcinoma 1.3 cm negative for LVI; DCIS 1 mm from margins, 1 sentinel node negative, grade 3 ER 95%, PR 77%, HER-2 amplified ratio 8.25, Ki-67 43%      06/18/2014 Pathologic Stage    Stage IA: T1c N0 M0      07/18/2014 - 10/03/2014 Chemotherapy    Abraxane Herceptin weekly 12 followed by Herceptin maintenance every 3 weeks to  complete one year completed 07/03/2015      10/30/2014 - 12/17/2014 Radiation Therapy    Adjuvant radiation therapy: Right breast 50.4 gray in 28 fractions, lumpectomy cavity boost 12 gray in 6 fractions      02/01/2015 -  Anti-estrogen oral therapy    Tamoxifen 20 mg daily       CHIEF COMPLIANT: follow-up on tamoxifen therapy  INTERVAL HISTORY: Krystal Kelley is a 46 year old with above-mentioned history of right breast cancer treated with lumpectomy followed by adjuvant chemotherapy and Herceptin. She finished adjuvant radiation and is currently on tamoxifen therapy. She appears to be tolerating tamoxifen fairly well. She does have intermittent joint and muscle pains as well as hot flashes. She had applied for disability and it was denied.  REVIEW OF SYSTEMS:   Constitutional: Denies fevers, chills or abnormal weight loss Eyes: Denies blurriness of vision Ears, nose, mouth, throat, and face: Denies mucositis or sore throat Respiratory: Denies cough, dyspnea or wheezes Cardiovascular: Denies palpitation, chest discomfort Gastrointestinal:  Denies nausea, heartburn or change in bowel habits Skin: Denies abnormal skin rashes Lymphatics: Denies new lymphadenopathy or easy bruising Neurological:Denies numbness, tingling or new weaknesses Behavioral/Psych: Mood is stable, no new changes  Extremities: No lower extremity edema Breast:  denies any pain or lumps or nodules in either breasts All other systems were reviewed with the patient and are negative.  I have reviewed the past medical history, past surgical history, social history  and family history with the patient and they are unchanged from previous note.  ALLERGIES:  has No Known Allergies.  MEDICATIONS:  Current Outpatient Prescriptions  Medication Sig Dispense Refill  . BELVIQ 10 MG TABS Take 100 mg by mouth 2 (two) times daily.  1  . cycloSPORINE (RESTASIS) 0.05 % ophthalmic emulsion Place 1 drop into both eyes 2 (two)  times daily.    . Eluxadoline (VIBERZI) 100 MG TABS Take 1 tablet by mouth 2 (two) times daily. 60 tablet 5  . furosemide (LASIX) 40 MG tablet Take 1 tablet (40 mg total) by mouth daily as needed. 30 tablet 6  . hydrOXYzine (VISTARIL) 25 MG capsule Take 1 capsule (25 mg total) by mouth at bedtime as needed (sleep). 30 capsule 1  . tamoxifen (NOLVADEX) 20 MG tablet TAKE 1 TABLET BY MOUTH DAILY 90 tablet 0  . Vitamin D, Ergocalciferol, (DRISDOL) 50000 units CAPS capsule TK 1 C PO ONCE A WEEK. REPEAT LAB AFTER COMPLETION  0   No current facility-administered medications for this visit.     PHYSICAL EXAMINATION: ECOG PERFORMANCE STATUS: 1 - Symptomatic but completely ambulatory  Vitals:   01/04/17 1109  BP: (!) 148/85  Pulse: 81  Resp: 20  Temp: 98.5 F (36.9 C)  SpO2: 100%   Filed Weights   01/04/17 1109  Weight: 244 lb 6.4 oz (110.9 kg)    GENERAL:alert, no distress and comfortable SKIN: skin color, texture, turgor are normal, no rashes or significant lesions EYES: normal, Conjunctiva are pink and non-injected, sclera clear OROPHARYNX:no exudate, no erythema and lips, buccal mucosa, and tongue normal  NECK: supple, thyroid normal size, non-tender, without nodularity LYMPH:  no palpable lymphadenopathy in the cervical, axillary or inguinal LUNGS: clear to auscultation and percussion with normal breathing effort HEART: regular rate & rhythm and no murmurs and no lower extremity edema ABDOMEN:abdomen soft, non-tender and normal bowel sounds MUSCULOSKELETAL:no cyanosis of digits and no clubbing  NEURO: alert & oriented x 3 with fluent speech, no focal motor/sensory deficits EXTREMITIES: No lower extremity edema BREAST: No palpable masses or nodules in either right or left breasts. No palpable axillary supraclavicular or infraclavicular adenopathy no breast tenderness or nipple discharge. (exam performed in the presence of a chaperone)  LABORATORY DATA:  I have reviewed the data as  listed   Chemistry      Component Value Date/Time   NA 139 09/16/2016 1829   NA 142 12/12/2015 1157   K 3.9 09/16/2016 1829   K 3.9 12/12/2015 1157   CL 103 09/16/2016 1829   CL 102 06/11/2013 0408   CO2 29 09/16/2016 1829   CO2 30 (H) 12/12/2015 1157   BUN 10 09/16/2016 1829   BUN 13.8 12/12/2015 1157   CREATININE 0.93 09/16/2016 1829   CREATININE 1.1 12/12/2015 1157      Component Value Date/Time   CALCIUM 9.2 09/16/2016 1829   CALCIUM 9.6 12/12/2015 1157   ALKPHOS 52 02/20/2016 1822   ALKPHOS 59 12/12/2015 1157   AST 43 (H) 02/20/2016 1822   AST 27 12/12/2015 1157   ALT 43 02/20/2016 1822   ALT 26 12/12/2015 1157   BILITOT 0.6 02/20/2016 1822   BILITOT 0.42 12/12/2015 1157       Lab Results  Component Value Date   WBC 14.2 (H) 02/20/2016   HGB 11.9 (L) 02/20/2016   HCT 35.2 (L) 02/20/2016   MCV 92.4 02/20/2016   PLT 279 02/20/2016   NEUTROABS 11.1 (H) 02/20/2016    ASSESSMENT &  PLAN:  Breast cancer of lower-inner quadrant of right female breast Right breast lumpectomy 06/18/2014: Invasive ductal carcinoma 1.3 cm negative for LVI; DCIS 1 mm from margins, 1 sentinel node negative, grade 3 ER 95%, PR 77%, HER-2 amplified ratio 8.25, Ki-67 43% T1 cN0 M0 stage IA  Treatment summary S/P Adjuvant chemo with Abraxane Herceptin started 07/18/2014 completed 10/03/14, Herceptin maintenance completed 07/03/2015 Status post adjuvant radiation completed 12/17/2014 Started tamoxifen 12/26/2014  Current treatment:Tamoxifen 20 mg daily 12/26/2014  Tamoxifen toxicities:  1. Emotional changes especially crying spells and anxiety spells: Much improved Since she is seeing a therapist 2. Hot flashes. 3. Diffuse myalgias and arthralgias  Fatigue: Improving with activity  Obesity: Patient is exercising more often Patient was denied disability. I discussed with her that she has to start applying for jobs and go back to work force. I provided her with a letter to enable  her to get back to work.  Return to clinic in 1 year for follow-up  I spent 15 minutes talking to the patient of which more than half was spent in counseling and coordination of care.  No orders of the defined types were placed in this encounter.  The patient has a good understanding of the overall plan. she agrees with it. she will call with any problems that may develop before the next visit here.   Rulon Eisenmenger, MD 01/04/17

## 2017-01-04 NOTE — Telephone Encounter (Signed)
No 9/18 los -  

## 2017-01-04 NOTE — Assessment & Plan Note (Signed)
Right breast lumpectomy 06/18/2014: Invasive ductal carcinoma 1.3 cm negative for LVI; DCIS 1 mm from margins, 1 sentinel node negative, grade 3 ER 95%, PR 77%, HER-2 amplified ratio 8.25, Ki-67 43% T1 cN0 M0 stage IA  Treatment summary S/P Adjuvant chemo with Abraxane Herceptin started 07/18/2014 completed 10/03/14, Herceptin maintenance completed 07/03/2015 Status post adjuvant radiation completed 12/17/2014 Started tamoxifen 12/26/2014  Current treatment:Tamoxifen 20 mg daily 12/26/2014  Tamoxifen toxicities:  1. Emotional changes especially crying spells and anxiety spells: Much improved Since she is seeing a therapist 2. Vaginal yeast infections: Treated with over-the-counter creams with improvement. Denies any hot flashes. 3. Diffuse myalgias and arthralgias  Fatigue: Improving with activity  Obesity: Patient is exercising more often  Return to clinic in 1 year for follow-up

## 2017-01-05 ENCOUNTER — Encounter: Payer: Self-pay | Admitting: Obstetrics & Gynecology

## 2017-01-05 ENCOUNTER — Ambulatory Visit (INDEPENDENT_AMBULATORY_CARE_PROVIDER_SITE_OTHER): Payer: BLUE CROSS/BLUE SHIELD | Admitting: Obstetrics & Gynecology

## 2017-01-05 ENCOUNTER — Encounter (HOSPITAL_COMMUNITY): Payer: Self-pay | Admitting: Licensed Clinical Social Worker

## 2017-01-05 ENCOUNTER — Ambulatory Visit (INDEPENDENT_AMBULATORY_CARE_PROVIDER_SITE_OTHER): Payer: BLUE CROSS/BLUE SHIELD | Admitting: Licensed Clinical Social Worker

## 2017-01-05 VITALS — BP 130/86 | Ht 63.5 in | Wt 244.0 lb

## 2017-01-05 DIAGNOSIS — E6609 Other obesity due to excess calories: Secondary | ICD-10-CM

## 2017-01-05 DIAGNOSIS — Z789 Other specified health status: Secondary | ICD-10-CM

## 2017-01-05 DIAGNOSIS — Z113 Encounter for screening for infections with a predominantly sexual mode of transmission: Secondary | ICD-10-CM | POA: Diagnosis not present

## 2017-01-05 DIAGNOSIS — F4310 Post-traumatic stress disorder, unspecified: Secondary | ICD-10-CM

## 2017-01-05 DIAGNOSIS — C50311 Malignant neoplasm of lower-inner quadrant of right female breast: Secondary | ICD-10-CM

## 2017-01-05 DIAGNOSIS — Z170 Estrogen receptor positive status [ER+]: Secondary | ICD-10-CM | POA: Diagnosis not present

## 2017-01-05 DIAGNOSIS — Z6841 Body Mass Index (BMI) 40.0 and over, adult: Secondary | ICD-10-CM

## 2017-01-05 DIAGNOSIS — Z1151 Encounter for screening for human papillomavirus (HPV): Secondary | ICD-10-CM | POA: Diagnosis not present

## 2017-01-05 DIAGNOSIS — Z01419 Encounter for gynecological examination (general) (routine) without abnormal findings: Secondary | ICD-10-CM | POA: Diagnosis not present

## 2017-01-05 DIAGNOSIS — IMO0001 Reserved for inherently not codable concepts without codable children: Secondary | ICD-10-CM

## 2017-01-05 MED ORDER — BELVIQ 10 MG PO TABS: 10.0000 mg | ORAL_TABLET | Freq: Two times a day (BID) | ORAL | 5 refills | Status: DC

## 2017-01-05 NOTE — Progress Notes (Addendum)
AVE SCHARNHORST [birth year] [id number]   History:    46 y.o. G0 Single.  RP:  Established patient presenting for annual gyn exam   HPI:  No menses currently on Tamoxifen for Rt Breast Ca.  Rt Breast lumpectomy 06/18/2014.  Post Radiation Therapy.   Not currently sexually active, but has been since last Gyn exam, would like STI screen.  No pelvic pain.  No vaginal d/c.  Mictions normal/BMs wnl.  Obesity BMI 42.54 on Belviq, followed by Gustavo Lah.  Past medical history,surgical history, family history and social history were all reviewed and documented in the EPIC chart.  Gynecologic History No LMP recorded. Patient is not currently having periods (Reason: Chemotherapy). Contraception: condoms on-off Last Pap: 2017. Results were: normal Last mammogram: 07/2016. Results were: normal  Obstetric History OB History  Gravida Para Term Preterm AB Living  0 0 0 0 0 0  SAB TAB Ectopic Multiple Live Births  0 0 0 0 0         ROS: A ROS was performed and pertinent positives and negatives are included in the history. GENERAL: No fevers or chills. HEENT: No change in vision, no earache, sore throat or sinus congestion. NECK: No pain or stiffness. CARDIOVASCULAR: No chest pain or pressure. No palpitations. PULMONARY: No shortness of breath, cough or wheeze. GASTROINTESTINAL: No abdominal pain, nausea, vomiting or diarrhea, melena or bright red blood per rectum. GENITOURINARY: No urinary frequency, urgency, hesitancy or dysuria. MUSCULOSKELETAL: No joint or muscle pain, no back pain, no recent trauma. DERMATOLOGIC: No rash, no itching, no lesions. ENDOCRINE: No polyuria, polydipsia, no heat or cold intolerance. No recent change in weight. HEMATOLOGICAL: No anemia or easy bruising or bleeding. NEUROLOGIC: No headache, seizures, numbness, tingling or weakness. PSYCHIATRIC: No depression, no loss of interest in normal activity or change in sleep pattern.     Exam:   BP 130/86   Ht 5' 3.5" (1.613  m)   Wt 244 lb (110.7 kg)   BMI 42.54 kg/m   Body mass index is 42.54 kg/m.  General appearance : Well developed well nourished female. No acute distress HEENT: Eyes: no retinal hemorrhage or exudates,  Neck supple, trachea midline, no carotid bruits, no thyroidmegaly Lungs: Clear to auscultation, no rhonchi or wheezes, or rib retractions  Heart: Regular rate and rhythm, no murmurs or gallops Breast:Examined in sitting and supine position were symmetrical in appearance, no palpable masses or tenderness,  no skin retraction, no nipple inversion, no nipple discharge, no skin discoloration, no axillary or supraclavicular lymphadenopathy Abdomen: no palpable masses or tenderness, no rebound or guarding Extremities: no edema or skin discoloration or tenderness  Pelvic: Vulva normal  Bartholin, Urethra, Skene Glands: Within normal limits             Vagina: No gross lesions or discharge  Cervix: No gross lesions or discharge.  Pap/HPV done.  Gono-Chlam.  Uterus  AV, normal size, shape and consistency, non-tender and mobile  Adnexa  Without masses or tenderness  Anus and perineum  normal   Assessment/Plan:  46 y.o. female for annual exam   1. Encounter for routine gynecological examination with Papanicolaou smear of cervix Normal Gyn exam.  Pap/HPV HR done.  Breast exam wnl s/p Left Breast Ca/Lumpectomy/Radiation and Chemotherapy currently.  Fasting labs today. - CBC - Comp Met (CMET) - TSH - Lipid panel - Vitamin D 1,25 dihydroxy - Pap IG, CT/NG NAA, and HPV (high risk)  2. Use of condoms for contraception Importance of  condom use for contraception and STI prevention discussed.  3. Screen for STD (sexually transmitted disease) Condoms recommended -Gono-Chlam on pap - HIV antibody - RPR - Hepatitis B Surface AntiGEN - Hepatitis C Antibody - Pap IG, CT/NG NAA, and HPV (high risk)  4. Class 3 obesity due to excess calories without serious comorbidity with body mass index (BMI)  of 40.0 to 44.9 in adult [facility]) Low calorie/carb diet recommended.  Regular physical activity.  Belviq represcribed.  Follow weight management with Gustavo Lah.  5. Special screening examination for human papillomavirus (HPV)  - Pap IG, CT/NG NAA, and HPV (high risk)  6. Malignant neoplasm of lower-inner quadrant of right breast of female, estrogen receptor positive ([place]) Rt breast lumpectomy/Radiation/Tamoxifen currently.  Counseling on above issues >50% x 15 minutes  [name] MD, 12:31 PM 01/05/2017

## 2017-01-05 NOTE — Progress Notes (Signed)
   THERAPIST PROGRESS NOTE  Session Time: 10:20 - 11:10  Participation Level: Active  Behavioral Response: Casual/ Upbeat  Type of Therapy: Individual Therapy  Treatment Goals addressed: Depressive symptoms   Interventions:  CBT  Summary: Krystal Kelley is a 46 y.o. female who presents with PTSD.   Suicidal/Homicidal: Nowithout intent/plan  Therapist Response: [name] met with clinician for an individual session. She discussed her psychiatric symptoms and current life events. Pt has been released from her oncologist to go back to work. Pt has mixed feelings about this decision. Her LTD was cut off so she needs a paycheck. She has been out of work for 2  Years and plans to go back to [employer] as a "rehire." Pt was able to discuss her fears and challenges in going back to work. Discussed basic CBT concepts with pt, thought emotion concept and alternative perspectives.Pt was open to new perspectives. Pt did not follow through with writing down her communication style, tone of voice, however she was able to voice what she noticed. Role played with pt keeping her tone level, even when angry, without shouting. Pt will continue to practice her communication styles.    Plan: Return again in 2 weeks.  Diagnosis: Axis I: PTSD       MACKENZIE,LISBETH S, LCAS 01/05/17 

## 2017-01-06 ENCOUNTER — Encounter: Payer: Self-pay | Admitting: Physical Therapy

## 2017-01-06 ENCOUNTER — Ambulatory Visit: Payer: BLUE CROSS/BLUE SHIELD | Admitting: Physical Therapy

## 2017-01-06 ENCOUNTER — Encounter (HOSPITAL_COMMUNITY): Payer: Self-pay | Admitting: Licensed Clinical Social Worker

## 2017-01-06 DIAGNOSIS — G8929 Other chronic pain: Secondary | ICD-10-CM

## 2017-01-06 DIAGNOSIS — M25511 Pain in right shoulder: Secondary | ICD-10-CM | POA: Diagnosis not present

## 2017-01-06 DIAGNOSIS — M6281 Muscle weakness (generalized): Secondary | ICD-10-CM

## 2017-01-06 LAB — PAP IG, CT-NG NAA, HPV HIGH-RISK
C. trachomatis RNA, TMA: NOT DETECTED
HPV DNA High Risk: NOT DETECTED
N. gonorrhoeae RNA, TMA: NOT DETECTED

## 2017-01-06 NOTE — Therapy (Signed)
[place] MAIN [facility] SERVICES [address] [place], [place], [address] Phone: [phone]   Fax:  [phone]  Physical Therapy Treatment/Progress Note  Patient Details  Name: Krystal Kelley MRN: [id number] Date of Birth: 11-01-1970 Referring Provider: Nicholas Lose  Encounter Date: 01/06/2017      PT End of Session - 01/06/17 1009    Visit Number 7   Number of Visits 12   Date for PT Re-Evaluation 02/03/17   PT Start Time 0940   PT Stop Time 1019   PT Time Calculation (min) 39 min   Activity Tolerance Patient tolerated treatment well   Behavior During Therapy [facility] for tasks assessed/performed      Past Medical History:  Diagnosis Date  . Anxiety   . Breast cancer, right breast ([place])   . Depression   . Peripheral edema    on lasix  . Radiation 10/30/14-12/17/14   right breast 50.4 gray    Past Surgical History:  Procedure Laterality Date  . BREAST BIOPSY  05/16/14  . BREAST LUMPECTOMY Right 06/18/2014  . bunion surgrery    . CARPAL TUNNEL RELEASE Left 09/21/2016   Procedure: LEFT CARPAL TUNNEL RELEASE;  Surgeon: Daryll Brod, MD;  Location: [facility];  Service: Orthopedics;  Laterality: Left;  REG/FAB  . carpel tunnel    . CHOLECYSTECTOMY  2015  . PLANTAR FASCIECTOMY    . PORTACATH PLACEMENT Left 06/18/2014   Procedure: INSERTION PORT-A-CATH;  Surgeon: Excell Seltzer, MD;  Location: [place];  Service: General;  Laterality: Left;    There were no vitals filed for this visit.      Subjective Assessment - 01/06/17 0944    Subjective Pt reports overall feeling ok today. She reports that generally her pain is not as bad as before, though she is having some R shouldr pain. No hip pain at this time.   Pertinent History Diagnosed with right breast cancer in February 2016; had lumpectomy June 18, 2014 with 4-5 lymph nodes removed (all negative).  Adjuvant chemotherapy (still on herceptin) and  radiation, completed August 2016.  Had superficial blood clot in right leg that caused swelling, so was put on Lasix. ON tamoxifen.    Limitations Walking;Lifting;Standing;House hold activities   How long can you stand comfortably? varies depending on posture   How long can you walk comfortably? vaires depending on posture   Patient Stated Goals to be able to ambulate longer distances and sleep more comfortably   Pain Score 5    Pain Location Shoulder   Pain Orientation Right   Pain Descriptors / Indicators Tender   Pain Type Chronic pain   Pain Onset More than a month ago   Pain Frequency Constant   Aggravating Factors  R sidelying, palpation, end range movement.         Treatment:  Goals Re-assessed:   R sidelying: Pt can lay on R side for about 5 min before having to shift due to pain; pt is sleeping well with no disturbance of sleep due to pain.     Pain: Current: R shoulder pain 5/10, 0/10 L shoulder. Hips, 0/10. Previously 10/10 R shoulder , 8/10 right, 7/10 left   6MW test: 1580' with supervision and no AD with Borg perceived exertion 3/10, improved from previously on 11/17/16 1400 feet   Quick DASH: 52% Disability; which is worse than pt's score on 04/22/15. Previous score is from a year after pt's breast CA diagnosis 6 months  after completing radiation.   DASH Score 22.73 %  04/22/15     Stength: Grossly improved by 1 to 2 muscle grades from last assessment indicating a major improvement.    Current:  LEFT RIGHT  Shoulder flex 4/5 4-/5  Shoulder Abd 4+/5 4-/5  Shoulder Ext 4/5 4/5  Shoulder IR/ER 4+/5 4+/5    Hip Flex 4-/5 4-/5  Hip Abd 4-/5 4-/5  Hip Add 4-/5 4-/5          Knee Flex 5/5 4+/5  Knee Ext 5/5 4+/5  Ankle DF 4/5 4/5  Ankle PF 4-/5 4-/5   Previously on 11/17/16    LEFT RIGHT  Shoulder flex 4/5 4/5  Shoulder Abd 4/5 4/5            Hip Flex 3/5 3/5  Hip Abd 3+/5 3+/5  Hip Add 2/5 2/5          Knee Flex 4/5 4/5  Knee Ext 4/5 4/5  Ankle  DF 4/5 4/5  Ankle PF 4/5 4/5    Pt instructed in chronic pain management strategies with instruction to obtain access to a pool and/or gym to continue aquatic therapy HEP following discharge from therapy.                  PT Education - 01/06/17 1454    Education provided Yes   Education Details recommendations, progress towards goals   Person(s) Educated Patient   Methods Explanation;Demonstration;Verbal cues   Comprehension Verbalized understanding;Returned demonstration;Verbal cues required;Need further instruction             PT Long Term Goals - 01/06/17 1038      PT LONG TERM GOAL #1   Title Patient will increase six minute walk test distance to >1500 to be able to participate in community activities with her family.   Baseline 1580 feet 6MW test   Time 4   Period Weeks   Status Achieved   Target Date 02/03/17     PT LONG TERM GOAL #2   Title Patient will report a worst pain of 3/10 on VAS in  shoulder and b hips  to improve tolerance with ADLs and reduced symptoms with activities.    Baseline  R shoulder pain 5/10, 0/10 L shoulder. Hips, 0/10.    Time 4   Period Weeks   Status Partially Met   Target Date 02/03/17     PT LONG TERM GOAL #3   Title Pt will be able to lay on her R side without interruption of pain in order to increase sleep quality.    Baseline Pt can lay on R side for about 5 min before having to shift due to pain; pt is sleeping well with no disturbance of sleep due to pain.    Time 4   Period Weeks   Status Partially Met   Target Date 02/03/17     PT LONG TERM GOAL #4   Title  Patient will decrease Quick DASH score by > 8 points demonstrating reduced self-reported upper extremity disability.   Baseline  52% Disability indicating moderate disability    Time 4   Period Weeks   Status New   Target Date 02/03/17     PT LONG TERM GOAL #5   Title Patient will increase BLE and BUE gross strength to 4/5 as to improve functional  strength for independent gait, increased standing tolerance and increased ADL ability.   Baseline 4- in several muscle groups. See progress note  9/20   Time 4   Period Weeks   Status New   Target Date 02/03/17               Plan - 01/06/17 1017    Clinical Impression Statement Pt re-assessed in goals today with good improvement in 51mwalk test, strength, and reported pain. Currently pt only reports pain in R shoulder. Quick DASH score indicates that pt considers herself to be 52% disabled, which is worse than pt's score on 04/22/15. Previous score is from a year after pt's breast CA diagnosis 6 months after completing radiation. Pt reports that she plans to return to work next week due to improvement in her pain and mobility, though she expresses concern about her activity tolerance and fatigue level. Pt instructed in chronic pain management strategies with instruction to obtain access to a pool and/or gym to continue aquatic therapy HEP following discharge from therapy. Pt is unsure if she will gain access to a pool in the near future. Pt strongly encouraged to obtain access to pool over the next 4 weeks to justify continued aquatic therapy currently with the goal of discharging pt with independence with aquatic HEP. Pt reports that she feels confident with her HEP program and will plan to be more independent over the next 4 weeks of therapy. Pt will benefit from continued aquatic therapy to address her pain and functional mobility deficits   Rehab Potential Good   Clinical Impairments Affecting Rehab Potential none    PT Frequency 1x / week   PT Duration 4 weeks   PT Treatment/Interventions Aquatic Therapy;Therapeutic exercise;Therapeutic activities;Cryotherapy;Moist Heat;Manual techniques   Consulted and Agree with Plan of Care Patient      Patient will benefit from skilled therapeutic intervention in order to improve the following deficits and impairments:  Decreased endurance,  Difficulty walking, Decreased activity tolerance, Decreased strength, Impaired flexibility, Pain  Visit Diagnosis: Chronic right shoulder pain - Plan: PT plan of care cert/re-cert  Muscle weakness (generalized) - Plan: PT plan of care cert/re-cert     Problem List Patient Active Problem List   Diagnosis Date Noted  . Posttraumatic stress disorder 12/09/2016  . Obesity (BMI 35.0-39.9 without comorbidity) 03/20/2015  . Leg edema, right 10/03/2014  . Genetic testing 07/10/2014  . Breast cancer of lower-inner quadrant of right [place] breast ([place] 05/29/2014   Farren Nelles MLenis Dickinson SPT This entire session was performed under direct supervision and direction of a licensed therapist/therapist assistant . I have personally read, edited and approve of the note as written.  Trotter,Margaret PT, DPT 01/06/2017, 2:57 PM  [place] [facility] [address]ill[place] [address]Phone: [phone]  Fax:  [phone] Name: SRAYAH FINESMRN: [id number] of Birth: 7March 15, 1972

## 2017-01-07 ENCOUNTER — Other Ambulatory Visit: Payer: Self-pay | Admitting: Gastroenterology

## 2017-01-09 LAB — VITAMIN D 1,25 DIHYDROXY
Vitamin D 1, 25 (OH)2 Total: 62 pg/mL (ref 18–72)
Vitamin D2 1, 25 (OH)2: 37 pg/mL
Vitamin D3 1, 25 (OH)2: 25 pg/mL

## 2017-01-09 LAB — HEPATITIS B SURFACE ANTIGEN: Hepatitis B Surface Ag: NONREACTIVE

## 2017-01-09 LAB — CBC
HCT: 39.6 % (ref 35.0–45.0)
Hemoglobin: 13.4 g/dL (ref 11.7–15.5)
MCH: 31.1 pg (ref 27.0–33.0)
MCHC: 33.8 g/dL (ref 32.0–36.0)
MCV: 91.9 fL (ref 80.0–100.0)
MPV: 9.8 fL (ref 7.5–12.5)
Platelets: 339 10*3/uL (ref 140–400)
RBC: 4.31 Million/uL (ref 3.80–5.10)
RDW: 12.9 % (ref 11.0–15.0)
WBC: 7.8 Thousand/uL (ref 3.8–10.8)

## 2017-01-09 LAB — LIPID PANEL
Cholesterol: 198 mg/dL (ref <200)
HDL: 104 mg/dL (ref >50)
LDL Cholesterol (Calc): 77 mg/dL (calc)
Non-HDL Cholesterol (Calc): 94 mg/dL (ref <130)
Total CHOL/HDL Ratio: 1.9 (calc) (ref <5.0)
Triglycerides: 83 mg/dL (ref <150)

## 2017-01-09 LAB — RPR: RPR Ser Ql: NONREACTIVE

## 2017-01-09 LAB — COMPREHENSIVE METABOLIC PANEL WITH GFR
AG Ratio: 1.2 (calc) (ref 1.0–2.5)
ALT: 22 U/L (ref 6–29)
Albumin: 4.1 g/dL (ref 3.6–5.1)
CO2: 26 mmol/L (ref 20–32)
Calcium: 9.1 mg/dL (ref 8.6–10.2)
Globulin: 3.3 g/dL (ref 1.9–3.7)
Glucose, Bld: 88 mg/dL (ref 65–99)
Potassium: 4.1 mmol/L (ref 3.5–5.3)
Sodium: 139 mmol/L (ref 135–146)
Total Bilirubin: 0.4 mg/dL (ref 0.2–1.2)
Total Protein: 7.4 g/dL (ref 6.1–8.1)

## 2017-01-09 LAB — COMPREHENSIVE METABOLIC PANEL
AST: 20 U/L (ref 10–35)
Alkaline phosphatase (APISO): 54 U/L (ref 33–115)
BUN: 11 mg/dL (ref 7–25)
Chloride: 104 mmol/L (ref 98–110)
Creat: 0.93 mg/dL (ref 0.50–1.10)

## 2017-01-09 LAB — HIV ANTIBODY (ROUTINE TESTING W REFLEX): HIV 1&2 Ab, 4th Generation: NONREACTIVE

## 2017-01-09 LAB — HEPATITIS C ANTIBODY
Hepatitis C Ab: NONREACTIVE
SIGNAL TO CUT-OFF: 0.02 (ref <1.00)

## 2017-01-09 LAB — TSH: TSH: 1.31 mIU/L

## 2017-01-09 NOTE — Patient Instructions (Signed)
1. Encounter for routine gynecological examination with Papanicolaou smear of cervix Normal Gyn exam.  Pap/HPV HR done.  Breast exam wnl s/p Left Breast Ca/Lumpectomy/Radiation and Chemotherapy currently.  Fasting labs today. - CBC - Comp Met (CMET) - TSH - Lipid panel - Vitamin D 1,25 dihydroxy - Pap IG, CT/NG NAA, and HPV (high risk)  2. Use of condoms for contraception Importance of condom use for contraception and STI prevention discussed.  3. Screen for STD (sexually transmitted disease) Condoms recommended -Gono-Chlam on pap - HIV antibody - RPR - Hepatitis B Surface AntiGEN - Hepatitis C Antibody - Pap IG, CT/NG NAA, and HPV (high risk)  4. Class 3 obesity due to excess calories without serious comorbidity with body mass index (BMI) of 40.0 to 44.9 in adult [facility]) Low calorie/carb diet recommended.  Regular physical activity.  5. Special screening examination for human papillomavirus (HPV)  - Pap IG, CT/NG NAA, and HPV (high risk)  6. Malignant neoplasm of lower-inner quadrant of right breast of female, estrogen receptor positive ([name]) Rt breast lumpectomy/Radiation/Chemotherapy currently.  Krystal Kelley, it was a pleasure to see you today!  I will inform you of your results as soon as available.  Health Maintenance, Female Adopting a healthy lifestyle and getting preventive care can go a long way to promote health and wellness. Talk with your health care provider about what schedule of regular examinations is right for you. This is a good chance for you to check in with your provider about disease prevention and staying healthy. In between checkups, there are plenty of things you can do on your own. Experts have done a lot of research about which lifestyle changes and preventive measures are most likely to keep you healthy. Ask your health care provider for more information. Weight and diet Eat a healthy diet  Be sure to include plenty of vegetables, fruits, low-fat dairy  products, and lean protein.  Do not eat a lot of foods high in solid fats, added sugars, or salt.  Get regular exercise. This is one of the most important things you can do for your health. ? Most adults should exercise for at least 150 minutes each week. The exercise should increase your heart rate and make you sweat (moderate-intensity exercise). ? Most adults should also do strengthening exercises at least twice a week. This is in addition to the moderate-intensity exercise.  Maintain a healthy weight  Body mass index (BMI) is a measurement that can be used to identify possible weight problems. It estimates body fat based on height and weight. Your health care provider can help determine your BMI and help you achieve or maintain a healthy weight.  For females 69 years of age and older: ? A BMI below 18.5 is considered underweight. ? A BMI of 18.5 to 24.9 is normal. ? A BMI of 25 to 29.9 is considered overweight. ? A BMI of 30 and above is considered obese.  Watch levels of cholesterol and blood lipids  You should start having your blood tested for lipids and cholesterol at 47 years of age, then have this test every 5 years.  You may need to have your cholesterol levels checked more often if: ? Your lipid or cholesterol levels are high. ? You are older than 47 years of age. ? You are at high risk for heart disease.  Cancer screening Lung Cancer  Lung cancer screening is recommended for adults 32-57 years old who are at high risk for lung cancer because of a  history of smoking.  A yearly low-dose CT scan of the lungs is recommended for people who: ? Currently smoke. ? Have quit within the past 15 years. ? Have at least a 30-pack-year history of smoking. A pack year is smoking an average of one pack of cigarettes a day for 1 year.  Yearly screening should continue until it has been 15 years since you quit.  Yearly screening should stop if you develop a health problem that would  prevent you from having lung cancer treatment.  Breast Cancer  Practice breast self-awareness. This means understanding how your breasts normally appear and feel.  It also means doing regular breast self-exams. Let your health care provider know about any changes, no matter how small.  If you are in your 20s or 30s, you should have a clinical breast exam (CBE) by a health care provider every 1-3 years as part of a regular health exam.  If you are 27 or older, have a CBE every year. Also consider having a breast X-ray (mammogram) every year.  If you have a family history of breast cancer, talk to your health care provider about genetic screening.  If you are at high risk for breast cancer, talk to your health care provider about having an MRI and a mammogram every year.  Breast cancer gene (BRCA) assessment is recommended for women who have family members with BRCA-related cancers. BRCA-related cancers include: ? Breast. ? Ovarian. ? Tubal. ? Peritoneal cancers.  Results of the assessment will determine the need for genetic counseling and BRCA1 and BRCA2 testing.  Cervical Cancer Your health care provider may recommend that you be screened regularly for cancer of the pelvic organs (ovaries, uterus, and vagina). This screening involves a pelvic examination, including checking for microscopic changes to the surface of your cervix (Pap test). You may be encouraged to have this screening done every 3 years, beginning at age 26.  For women ages 56-65, health care providers may recommend pelvic exams and Pap testing every 3 years, or they may recommend the Pap and pelvic exam, combined with testing for human papilloma virus (HPV), every 5 years. Some types of HPV increase your risk of cervical cancer. Testing for HPV may also be done on women of any age with unclear Pap test results.  Other health care providers may not recommend any screening for nonpregnant women who are considered low risk  for pelvic cancer and who do not have symptoms. Ask your health care provider if a screening pelvic exam is right for you.  If you have had past treatment for cervical cancer or a condition that could lead to cancer, you need Pap tests and screening for cancer for at least 20 years after your treatment. If Pap tests have been discontinued, your risk factors (such as having a new sexual partner) need to be reassessed to determine if screening should resume. Some women have medical problems that increase the chance of getting cervical cancer. In these cases, your health care provider may recommend more frequent screening and Pap tests.  Colorectal Cancer  This type of cancer can be detected and often prevented.  Routine colorectal cancer screening usually begins at 47 years of age and continues through 47 years of age.  Your health care provider may recommend screening at an earlier age if you have risk factors for colon cancer.  Your health care provider may also recommend using home test kits to check for hidden blood in the stool.  A small  camera at the end of a tube can be used to examine your colon directly (sigmoidoscopy or colonoscopy). This is done to check for the earliest forms of colorectal cancer.  Routine screening usually begins at age 18.  Direct examination of the colon should be repeated every 5-10 years through 47 years of age. However, you may need to be screened more often if early forms of precancerous polyps or small growths are found.  Skin Cancer  Check your skin from head to toe regularly.  Tell your health care provider about any new moles or changes in moles, especially if there is a change in a mole's shape or color.  Also tell your health care provider if you have a mole that is larger than the size of a pencil eraser.  Always use sunscreen. Apply sunscreen liberally and repeatedly throughout the day.  Protect yourself by wearing long sleeves, pants, a  wide-brimmed hat, and sunglasses whenever you are outside.  Heart disease, diabetes, and high blood pressure  High blood pressure causes heart disease and increases the risk of stroke. High blood pressure is more likely to develop in: ? People who have blood pressure in the high end of the normal range (130-139/85-89 mm Hg). ? People who are overweight or obese. ? People who are African American.  If you are 14-31 years of age, have your blood pressure checked every 3-5 years. If you are 46 years of age or older, have your blood pressure checked every year. You should have your blood pressure measured twice-once when you are at a hospital or clinic, and once when you are not at a hospital or clinic. Record the average of the two measurements. To check your blood pressure when you are not at a hospital or clinic, you can use: ? An automated blood pressure machine at a pharmacy. ? A home blood pressure monitor.  If you are between 78 years and 63 years old, ask your health care provider if you should take aspirin to prevent strokes.  Have regular diabetes screenings. This involves taking a blood sample to check your fasting blood sugar level. ? If you are at a normal weight and have a low risk for diabetes, have this test once every three years after 47 years of age. ? If you are overweight and have a high risk for diabetes, consider being tested at a younger age or more often. Preventing infection Hepatitis B  If you have a higher risk for hepatitis B, you should be screened for this virus. You are considered at high risk for hepatitis B if: ? You were born in a country where hepatitis B is common. Ask your health care provider which countries are considered high risk. ? Your parents were born in a high-risk country, and you have not been immunized against hepatitis B (hepatitis B vaccine). ? You have HIV or AIDS. ? You use needles to inject street drugs. ? You live with someone who has  hepatitis B. ? You have had sex with someone who has hepatitis B. ? You get hemodialysis treatment. ? You take certain medicines for conditions, including cancer, organ transplantation, and autoimmune conditions.  Hepatitis C  Blood testing is recommended for: ? Everyone born from [birth year] through 1966. ? Anyone with known risk factors for hepatitis C.  Sexually transmitted infections (STIs)  You should be screened for sexually transmitted infections (STIs) including gonorrhea and chlamydia if: ? You are sexually active and are younger than 47 years of  age. ? You are older than 47 years of age and your health care provider tells you that you are at risk for this type of infection. ? Your sexual activity has changed since you were last screened and you are at an increased risk for chlamydia or gonorrhea. Ask your health care provider if you are at risk.  If you do not have HIV, but are at risk, it may be recommended that you take a prescription medicine daily to prevent HIV infection. This is called pre-exposure prophylaxis (PrEP). You are considered at risk if: ? You are sexually active and do not regularly use condoms or know the HIV status of your partner(s). ? You take drugs by injection. ? You are sexually active with a partner who has HIV.  Talk with your health care provider about whether you are at high risk of being infected with HIV. If you choose to begin PrEP, you should first be tested for HIV. You should then be tested every 3 months for as long as you are taking PrEP. Pregnancy  If you are premenopausal and you may become pregnant, ask your health care provider about preconception counseling.  If you may become pregnant, take 400 to 800 micrograms (mcg) of folic acid every day.  If you want to prevent pregnancy, talk to your health care provider about birth control (contraception). Osteoporosis and menopause  Osteoporosis is a disease in which the bones lose minerals and  strength with aging. This can result in serious bone fractures. Your risk for osteoporosis can be identified using a bone density scan.  If you are 62 years of age or older, or if you are at risk for osteoporosis and fractures, ask your health care provider if you should be screened.  Ask your health care provider whether you should take a calcium or vitamin D supplement to lower your risk for osteoporosis.  Menopause may have certain physical symptoms and risks.  Hormone replacement therapy may reduce some of these symptoms and risks. Talk to your health care provider about whether hormone replacement therapy is right for you. Follow these instructions at home:  Schedule regular health, dental, and eye exams.  Stay current with your immunizations.  Do not use any tobacco products including cigarettes, chewing tobacco, or electronic cigarettes.  If you are pregnant, do not drink alcohol.  If you are breastfeeding, limit how much and how often you drink alcohol.  Limit alcohol intake to no more than 1 drink per day for nonpregnant women. One drink equals 12 ounces of beer, 5 ounces of wine, or 1 ounces of hard liquor.  Do not use street drugs.  Do not share needles.  Ask your health care provider for help if you need support or information about quitting drugs.  Tell your health care provider if you often feel depressed.  Tell your health care provider if you have ever been abused or do not feel safe at home. This information is not intended to replace advice given to you by your health care provider. Make sure you discuss any questions you have with your health care provider. Document Released: 10/19/2010 Document Revised: 09/11/2015 Document Reviewed: 01/07/2015 Elsevier Interactive Patient Education  [employer].

## 2017-01-11 ENCOUNTER — Ambulatory Visit: Payer: BLUE CROSS/BLUE SHIELD

## 2017-01-12 ENCOUNTER — Ambulatory Visit (INDEPENDENT_AMBULATORY_CARE_PROVIDER_SITE_OTHER): Payer: BLUE CROSS/BLUE SHIELD | Admitting: Licensed Clinical Social Worker

## 2017-01-12 DIAGNOSIS — F4310 Post-traumatic stress disorder, unspecified: Secondary | ICD-10-CM

## 2017-01-13 ENCOUNTER — Encounter (HOSPITAL_COMMUNITY): Payer: Self-pay | Admitting: Licensed Clinical Social Worker

## 2017-01-13 NOTE — Progress Notes (Signed)
   THERAPIST PROGRESS NOTE  Session Time: 10:20 - 11:10  Participation Level: Active  Behavioral Response: Casual/ Upbeat  Type of Therapy: Individual Therapy  Treatment Goals addressed: Depressive symptoms   Interventions:  CBT  Summary: Krystal Kelley is a 46 y.o. female who presents with PTSD.   Suicidal/Homicidal: Nowithout intent/plan  Therapist ResponseAllyson Sabal met with clinician for an individual session. She discussed her psychiatric symptoms and current life events. Due to Pt being released from her oncologist to go back to work she has been anxiously looking for work. PT reports she has bills to pay and must find a job. Referred pt to [employer] to assist with career planning. Again pt discussed her fears and challenges in going back to work. Discussed basic CBT concepts with pt, thought emotion concept and alternative perspectives.Pt was open to new perspectives. Gave pt module 1 for depression to complete and discuss in session. Pt participated and identified some of her depressive symptoms.    Plan: Return again in 2 weeks. Continue working on Depression Module 1.  Diagnosis: Axis I: PTSD       Damione Robideau S, LCAS 01/12/17

## 2017-01-18 ENCOUNTER — Ambulatory Visit: Payer: BLUE CROSS/BLUE SHIELD

## 2017-01-18 ENCOUNTER — Ambulatory Visit (INDEPENDENT_AMBULATORY_CARE_PROVIDER_SITE_OTHER): Payer: BLUE CROSS/BLUE SHIELD | Admitting: Psychiatry

## 2017-01-18 ENCOUNTER — Other Ambulatory Visit: Payer: Self-pay

## 2017-01-18 DIAGNOSIS — Z79899 Other long term (current) drug therapy: Secondary | ICD-10-CM

## 2017-01-18 DIAGNOSIS — F329 Major depressive disorder, single episode, unspecified: Secondary | ICD-10-CM

## 2017-01-18 DIAGNOSIS — F419 Anxiety disorder, unspecified: Secondary | ICD-10-CM | POA: Diagnosis not present

## 2017-01-18 DIAGNOSIS — G4700 Insomnia, unspecified: Secondary | ICD-10-CM | POA: Diagnosis not present

## 2017-01-18 DIAGNOSIS — F4312 Post-traumatic stress disorder, chronic: Secondary | ICD-10-CM | POA: Diagnosis not present

## 2017-01-18 MED ORDER — ELUXADOLINE 100 MG PO TABS: 1.0000 | ORAL_TABLET | Freq: Two times a day (BID) | ORAL | 5 refills | Status: DC

## 2017-01-18 MED ORDER — ESCITALOPRAM OXALATE 10 MG PO TABS: 10.0000 mg | ORAL_TABLET | Freq: Every day | ORAL | 2 refills | Status: DC

## 2017-01-18 NOTE — Progress Notes (Signed)
[facility] MD/PA/NP OP Progress Note  01/18/2017 12:10 PM Krystal Kelley  MRN:  [id number]  Chief Complaint: med management  HPI: Krystal Kelley presents for med management, at the behest of her therapist, encouraging her to reconsider SSRI for PTSD, depression, anxiety. I spent time with the patient reviewing the risks and benefits, and the areas in her life that she feels her mood and anxiety symptoms are interfering. She continues to be quite particular and stuck in black and white thinking, largely external locus of control in terms of her stressors. She feels like she misses out on some of the wonderful interpersonal interactions because of her anxiety, she tends to lock her self away when she is not at work. She returned to work about 2 weeks ago and reports that this is going okay, but it has been a stressful transition. She reports that her physical health has been okay, and she is encouraged. She is trying to work on being more active and losing weight, but doesn't feel motivated. She denies any suicidality or substance abuse.  We reviewed the risks and benefits of SSRI, and agreed to start Lexapro given that there is minimal interaction with her current medical therapies. We agreed to follow up in 10 weeks. I educated her on the need for this medication be taken every day.  Visit Diagnosis:    ICD-10-CM   1. Chronic post-traumatic stress disorder (PTSD) F43.12 escitalopram (LEXAPRO) 10 MG tablet    Past Psychiatric History: See intake H&P for full details. Reviewed, with no updates at this time.   Past Medical History:  Past Medical History:  Diagnosis Date  . Anxiety   . Breast cancer, right breast ([place])   . Depression   . Peripheral edema    on lasix  . Radiation 10/30/14-12/17/14   right breast 50.4 gray    Past Surgical History:  Procedure Laterality Date  . BREAST BIOPSY  05/16/14  . BREAST LUMPECTOMY Right 06/18/2014  . bunion surgrery    . CARPAL TUNNEL RELEASE Left  09/21/2016   Procedure: LEFT CARPAL TUNNEL RELEASE;  Surgeon: Daryll Brod, MD;  Location: [facility];  Service: Orthopedics;  Laterality: Left;  REG/FAB  . carpel tunnel    . CHOLECYSTECTOMY  2015  . PLANTAR FASCIECTOMY    . PORTACATH PLACEMENT Left 06/18/2014   Procedure: INSERTION PORT-A-CATH;  Surgeon: Excell Seltzer, MD;  Location: [facility];  Service: General;  Laterality: Left;    Family Psychiatric History: See intake H&P for full details. Reviewed, with no updates at this time.   Family History:  Family History  Problem Relation Age of Onset  . Kidney cancer Father        kidney  . Prostate cancer Maternal Uncle        deceased 40  . Lung cancer Maternal Grandfather        deceased 84; smoker  . Colon cancer Paternal Grandfather        deceased 46s  . Cancer Other        pat half-sister; ? uterine vs. ovarian ca    Social History:  Social History   Social History  . Marital status: Divorced    Spouse name: N/A  . Number of children: 0  . Years of education: N/A   Occupational History  . 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 Smoking status: Never Smoker  . Smokeless tobacco: Never Used  . Alcohol use  Yes     Comment: wine- occ   . Drug use: No  . Sexual activity: Not Currently    Birth control/ protection: None     Comment: 1st intercourse- 16, partners- 86,    Other Topics Concern  . Not on file   Social History Narrative  . No narrative on file    Allergies: No Known Allergies  Metabolic Disorder Labs: No results found for: HGBA1C, MPG No results found for: PROLACTIN Lab Results  Component Value Date   CHOL 198 01/05/2017   TRIG 83 01/05/2017   HDL 104 01/05/2017   CHOLHDL 1.9 01/05/2017   Lab Results  Component Value Date   TSH 1.31 01/05/2017    Therapeutic Level Labs: No results found for: LITHIUM No results found for: VALPROATE No components found for:  CBMZ  Current  Medications: Current Outpatient Prescriptions  Medication Sig Dispense Refill  . BELVIQ 10 MG TABS Take 10 mg by mouth 2 (two) times daily. 60 tablet 5  . cycloSPORINE (RESTASIS) 0.05 % ophthalmic emulsion Place 1 drop into both eyes 2 (two) times daily.    . Eluxadoline (VIBERZI) 100 MG TABS Take 1 tablet (100 mg total) by mouth 2 (two) times daily. 60 tablet 5  . escitalopram (LEXAPRO) 10 MG tablet Take 1 tablet (10 mg total) by mouth daily. 30 tablet 2  . furosemide (LASIX) 40 MG tablet Take 1 tablet (40 mg total) by mouth daily as needed. 30 tablet 6  . hydrOXYzine (VISTARIL) 25 MG capsule Take 1 capsule (25 mg total) by mouth at bedtime as needed (sleep). 30 capsule 1  . tamoxifen (NOLVADEX) 20 MG tablet TAKE 1 TABLET BY MOUTH DAILY 90 tablet 0  . Vitamin D, Ergocalciferol, (DRISDOL) 50000 units CAPS capsule TK 1 C PO ONCE A WEEK. REPEAT LAB AFTER COMPLETION  0   No current facility-administered medications for this visit.      Musculoskeletal: Strength & Muscle Tone: within normal limits Gait & Station: normal Patient leans: N/A  Psychiatric Specialty Exam: Review of Systems  Constitutional: Negative.   HENT: Negative.   Respiratory: Negative.   Cardiovascular: Negative.   Gastrointestinal: Negative.   Musculoskeletal: Negative.   Neurological: Negative.   Psychiatric/Behavioral: Positive for depression. Negative for hallucinations, memory loss, substance abuse and suicidal ideas. The patient is nervous/anxious and has insomnia.     There were no vitals taken for this visit.There is no height or weight on file to calculate BMI.  General Appearance: Casual and Guarded  Eye Contact:  Fair  Speech:  Clear and Coherent  Volume:  Normal  Mood:  Euthymic  Affect:  Non-Congruent and reports anxiety  Thought Process:  Goal Directed  Orientation:  Full (Time, Place, and Person)  Thought Content: Logical   Suicidal Thoughts:  No  Homicidal Thoughts:  No  Memory:  Immediate;    Fair  Judgement:  Fair  Insight:  Shallow  Psychomotor Activity:  Normal  Concentration:  Concentration: Fair  Recall:  [employer] of Knowledge: Fair  Language: Fair  Akathisia:  Negative  Handed:  Right  AIMS (if indicated): not done  Assets:  Communication Skills Desire for Improvement Financial Resources/Insurance Housing Leisure Time Resilience Social Support Talents/Skills Transportation Vocational/Educational  ADL's:  Intact  Cognition: WNL  Sleep:  Fair   Screenings:   Assessment and Plan: Krystal Kelley is a 46 year old female with a past medical history of breast cancer, and a childhood history of significant trauma, psychiatric history consistent  with chronic PTSD. She continues in individual therapy, but would benefit from initiation of an SSRI. She was fairly apprehensive about SSRI at our last visit, but has decided she is open to this. We agreed to start with Lexapro 10 mg daily for a mild approach, and given the lack of interaction between her other medications, specifically tamoxifen. She does not present any acute safety issues and will follow up in 10 weeks.  1. Chronic post-traumatic stress disorder (PTSD)    Continue in individual therapy Continue Vistaril as needed for sleep Initiate Lexapro 10 mg daily for anxiety and mood symptoms  I spent 30 minutes with the patient and direct care and counseling, discussing medication management options, and exploring some of the areas where her mood symptoms interfered with her life, and discussing behavioral strategies to experiment with new behaviors in response to her anxiety.  Aundra Dubin, MD 01/18/2017, 12:10 PM

## 2017-01-20 ENCOUNTER — Ambulatory Visit (HOSPITAL_COMMUNITY): Payer: Self-pay | Admitting: Licensed Clinical Social Worker

## 2017-01-25 ENCOUNTER — Ambulatory Visit: Payer: BLUE CROSS/BLUE SHIELD | Attending: Hematology and Oncology

## 2017-01-25 DIAGNOSIS — R531 Weakness: Secondary | ICD-10-CM | POA: Insufficient documentation

## 2017-01-25 DIAGNOSIS — M25611 Stiffness of right shoulder, not elsewhere classified: Secondary | ICD-10-CM | POA: Diagnosis present

## 2017-01-25 DIAGNOSIS — R279 Unspecified lack of coordination: Secondary | ICD-10-CM | POA: Insufficient documentation

## 2017-01-25 DIAGNOSIS — G8929 Other chronic pain: Secondary | ICD-10-CM | POA: Diagnosis present

## 2017-01-25 DIAGNOSIS — M6281 Muscle weakness (generalized): Secondary | ICD-10-CM

## 2017-01-25 DIAGNOSIS — R2689 Other abnormalities of gait and mobility: Secondary | ICD-10-CM | POA: Insufficient documentation

## 2017-01-25 DIAGNOSIS — R6889 Other general symptoms and signs: Secondary | ICD-10-CM | POA: Diagnosis present

## 2017-01-25 DIAGNOSIS — I890 Lymphedema, not elsewhere classified: Secondary | ICD-10-CM | POA: Diagnosis present

## 2017-01-25 DIAGNOSIS — M25551 Pain in right hip: Secondary | ICD-10-CM | POA: Insufficient documentation

## 2017-01-25 DIAGNOSIS — M25511 Pain in right shoulder: Secondary | ICD-10-CM | POA: Diagnosis present

## 2017-01-25 NOTE — Therapy (Signed)
Pt enters/exits pool via steps Participates in the following exercises:  Ambulation with good posture, arm swing - 4 L Fwd  - 4 L side  Core stabilization in wall sit with UEs, 20x ea - Triceps press down with green dumbbells - Sh flex/ext - Sh Abd/add - Sh horiz abd/add  Core stabilization with LE, 20x ea - hip flex/ext - hip abd/add - Squats  Abdominal stabilization with seated bicycle - 5 min  Stretching, 3 x 15 seconds ea - hamstrings - gastrocs - quad/hip flexors

## 2017-01-27 ENCOUNTER — Ambulatory Visit: Payer: BLUE CROSS/BLUE SHIELD

## 2017-01-27 ENCOUNTER — Ambulatory Visit (HOSPITAL_COMMUNITY): Payer: Self-pay | Admitting: Licensed Clinical Social Worker

## 2017-01-27 ENCOUNTER — Ambulatory Visit: Payer: Self-pay

## 2017-01-29 ENCOUNTER — Other Ambulatory Visit: Payer: Self-pay | Admitting: Hematology and Oncology

## 2017-01-29 DIAGNOSIS — C50311 Malignant neoplasm of lower-inner quadrant of right female breast: Secondary | ICD-10-CM

## 2017-02-01 ENCOUNTER — Ambulatory Visit: Payer: BLUE CROSS/BLUE SHIELD

## 2017-02-01 DIAGNOSIS — R531 Weakness: Secondary | ICD-10-CM

## 2017-02-01 DIAGNOSIS — M25551 Pain in right hip: Secondary | ICD-10-CM

## 2017-02-01 DIAGNOSIS — G8929 Other chronic pain: Secondary | ICD-10-CM

## 2017-02-01 DIAGNOSIS — R6889 Other general symptoms and signs: Secondary | ICD-10-CM

## 2017-02-01 DIAGNOSIS — M25611 Stiffness of right shoulder, not elsewhere classified: Secondary | ICD-10-CM

## 2017-02-01 DIAGNOSIS — R2689 Other abnormalities of gait and mobility: Secondary | ICD-10-CM

## 2017-02-01 DIAGNOSIS — I890 Lymphedema, not elsewhere classified: Secondary | ICD-10-CM

## 2017-02-01 DIAGNOSIS — M25511 Pain in right shoulder: Secondary | ICD-10-CM

## 2017-02-01 DIAGNOSIS — M6281 Muscle weakness (generalized): Secondary | ICD-10-CM

## 2017-02-01 DIAGNOSIS — R279 Unspecified lack of coordination: Secondary | ICD-10-CM

## 2017-02-01 NOTE — Therapy (Signed)
Pt enters/exits pool via steps Participates in the following  Ambulation - 4 L fwd/side with arm swing, focus posture  Stand hip exercises with core stabilization, 30x ea - deep squat - flex/ext swing - abd/add  Wall sit abdominal stab, 30x - shoulder abd/add - shoulder horiz abd/add - shoulder flex/ext - triceps press down, green dumbbells - overhead beach ball lift  Step ex's - fwd R/L lead step up/downs, quick pace, 1 min each  Stretching, 3 x 15 sec each - hams - quads - gastroc/soleus

## 2017-02-03 ENCOUNTER — Ambulatory Visit (INDEPENDENT_AMBULATORY_CARE_PROVIDER_SITE_OTHER): Payer: BLUE CROSS/BLUE SHIELD | Admitting: Licensed Clinical Social Worker

## 2017-02-03 DIAGNOSIS — F4310 Post-traumatic stress disorder, unspecified: Secondary | ICD-10-CM

## 2017-02-07 ENCOUNTER — Encounter (HOSPITAL_COMMUNITY): Payer: Self-pay | Admitting: Licensed Clinical Social Worker

## 2017-02-07 NOTE — Progress Notes (Signed)
   THERAPIST PROGRESS NOTE  Session Time: 10:20 - 11:10  Participation Level: Active  Behavioral Response: Casual/ Upbeat  Type of Therapy: Individual Therapy  Treatment Goals addressed: Depressive symptoms   Interventions:  CBT  Summary: Krystal Kelley is a 46 y.o. female who presents with PTSD.   Suicidal/Homicidal: Nowithout intent/plan  Therapist ResponseAllyson Sabal met with clinician for an individual session. She discussed her psychiatric symptoms and current life events. Pt  has been anxiously looking for work. PT reports she has bills to pay and must find a job. Pt reapplied to [employer] where she previously worked before she got breast cancer. Pt was disappointed she didn't get rehired. Pt did not follow through with referrals to [place] to assist with career planning.  pt discussed her fears and challenges in going back to work. Discussed vulnerability with pt. Watched Brene Brown Ted Talk Vulnerability. Pt does not let anyone in and appears to be strong for everyone. She has a limited support system.  Pt did not complete the module 1 on Depression. Suggested to pt to continue to work on the module and bring back to next session.    Plan: Return again in 2 weeks. Continue working on Depression Module 1.  Diagnosis: Axis I: PTSD       Asir Bingley S, LCAS  02/03/17

## 2017-02-09 ENCOUNTER — Ambulatory Visit: Payer: BLUE CROSS/BLUE SHIELD | Admitting: Physical Therapy

## 2017-02-09 ENCOUNTER — Encounter: Payer: Self-pay | Admitting: Physical Therapy

## 2017-02-09 ENCOUNTER — Other Ambulatory Visit (HOSPITAL_COMMUNITY): Payer: Self-pay

## 2017-02-09 DIAGNOSIS — G8929 Other chronic pain: Secondary | ICD-10-CM

## 2017-02-09 DIAGNOSIS — M25511 Pain in right shoulder: Secondary | ICD-10-CM | POA: Diagnosis not present

## 2017-02-09 DIAGNOSIS — F4323 Adjustment disorder with mixed anxiety and depressed mood: Secondary | ICD-10-CM

## 2017-02-09 DIAGNOSIS — M6281 Muscle weakness (generalized): Secondary | ICD-10-CM

## 2017-02-09 MED ORDER — HYDROXYZINE PAMOATE 25 MG PO CAPS: 25.0000 mg | ORAL_CAPSULE | Freq: Every evening | ORAL | 0 refills | Status: DC | PRN

## 2017-02-09 NOTE — Therapy (Signed)
[place] MAIN [facility] [address] [place], [place], [address] Phone: [phone]   Fax:  [phone]  Physical Therapy Treatment/Discharge Summary  Patient Details  Name: Krystal Kelley MRN: [id number] Date of Birth: 1971-01-27 Referring Provider: Nicholas Lose  Encounter Date: 02/09/2017      PT End of Session - 02/09/17 1126    Visit Number 10   Number of Visits 12   Date for PT Re-Evaluation 02/09/17   PT Start Time 1120   PT Stop Time 1145   PT Time Calculation (min) 25 min   Activity Tolerance Patient tolerated treatment well;No increased pain   Behavior During Therapy WFL for tasks assessed/performed      Past Medical History:  Diagnosis Date  . Anxiety   . Breast cancer, right breast ([place])   . Depression   . Peripheral edema    on lasix  . Radiation 10/30/14-12/17/14   right breast 50.4 gray    Past Surgical History:  Procedure Laterality Date  . BREAST BIOPSY  05/16/14  . BREAST LUMPECTOMY Right 06/18/2014  . bunion surgrery    . CARPAL TUNNEL RELEASE Left 09/21/2016   Procedure: LEFT CARPAL TUNNEL RELEASE;  Surgeon: Daryll Brod, MD;  Location: [facility];  Service: Orthopedics;  Laterality: Left;  REG/FAB  . carpel tunnel    . CHOLECYSTECTOMY  2015  . PLANTAR FASCIECTOMY    . PORTACATH PLACEMENT Left 06/18/2014   Procedure: INSERTION PORT-A-CATH;  Surgeon: Excell Seltzer, MD;  Location: [facility];  Service: General;  Laterality: Left;    There were no vitals filed for this visit.      Subjective Assessment - 02/09/17 1123    Subjective Patient is late to session; She reports that she missed a few pool appointments due to sickness. 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 She was late because she was going on an interview. Patient reports that she was released by MD to go back to work recently; She reports that she has been working in the  yard with planting flowers;    Currently in Pain? Yes   Pain Score 5    Pain Location Shoulder   Pain Orientation Right   Pain Descriptors / Indicators Aching;Tightness   Pain Type Chronic pain   Pain Onset More than a month ago   Pain Frequency Intermittent   Aggravating Factors  end range of motion;    Pain Relieving Factors rest/exercise, stretch; heat   Effect of Pain on Daily Activities decreased overhead reach/lifting tolerance;    Multiple Pain Sites No      TREATMENT: Warm up on Nustep BUE/BLE level 2 x4 min (unbilled);      [facility] PT Assessment - 02/09/17 0001      Observation/Other Assessments   Quick DASH  27.2 % impaired (the lower the score the better mobility) improved from 01/06/17 which was 52% impaired;      Strength   Overall Strength Comments BUE and BLE gross strength is 5/5 with exception RUE shoulder flexion 4+/5, abduction 4+/5, hip flexion 4+/5 bilaterally;        Patient instructed in outcome measures including quick dash and strength assessment, see above;  Patient continues to have pain with right sidelying being able to only tolerate lying on side for approximately 5 min; In addition she continues to have increased pain in right shoulder at end range of flexion/abduction; She reports no  pain at rest; Patient concerned that medication will keep her in pain;  She has been doing more around the house including some yardwork; In addition, she has been going on job interviews.  Patient is mod I for ADLs and most activities. She is appropriate for discharge from PT at this time; Reinforced HEP;                       PT Education - 02/09/17 1126    Education provided Yes   Education Details progress towards goals, recommendations;    Person(s) Educated Patient   Methods Explanation   Comprehension Verbalized understanding             PT Long Term Goals - 02/09/17 1127      PT LONG TERM GOAL #1   Title Patient will increase  six minute walk test distance to >1500 to be able to participate in community activities with her family.   Baseline 1580 feet 6MW test   Time 4   Period Weeks   Status Achieved   Target Date 02/09/17     PT LONG TERM GOAL #2   Title Patient will report a worst pain of 3/10 on VAS in  shoulder and b hips  to improve tolerance with ADLs and reduced symptoms with activities.    Baseline  R shoulder pain 5/10, 0/10 L shoulder. Hips, 0/10.    Time 4   Period Weeks   Status Partially Met   Target Date 02/09/17     PT LONG TERM GOAL #3   Title Pt will be able to lay on her R side without interruption of pain in order to increase sleep quality.    Baseline Pt can lay on R side for about 5 min before having to shift due to pain; pt is sleeping well with no disturbance of sleep due to pain.    Time 4   Period Weeks   Status Partially Met   Target Date 02/09/17     PT LONG TERM GOAL #4   Title  Patient will decrease Quick DASH score by > 8 points demonstrating reduced self-reported upper extremity disability.   Baseline current 27.2%   Time 4   Period Weeks   Status Achieved   Target Date 02/09/17     PT LONG TERM GOAL #5   Title Patient will increase BLE and BUE gross strength to 4/5 as to improve functional strength for independent gait, increased standing tolerance and increased ADL ability.   Time 4   Period Weeks   Status Achieved   Target Date 02/09/17               Plan - 02/09/17 1126    Clinical Impression Statement Pt instructed in outcome measures to assess progress towards goals; She exhibits improved strength and overall reports less pain with Quick Dash survey. However patient still has discomfort in right shoulder with end range flexion which could persist due to tamoxifin medication. She is going on interviews and is looking forward to going back to work. In addition she has been able to do some yard work. We recommend discharge from skilled PT intervention at  this time as patient is mod I in ADLs and activities. Patient agreeable. She is independent in HEP for continued exercise.    Rehab Potential Good   Clinical Impairments Affecting Rehab Potential none    PT Frequency One time visit   PT Duration --  PT Treatment/Interventions Aquatic Therapy;Therapeutic exercise;Therapeutic activities;Cryotherapy;Moist Heat;Manual techniques   Consulted and Agree with Plan of Care Patient      Patient will benefit from skilled therapeutic intervention in order to improve the following deficits and impairments:  Decreased endurance, Difficulty walking, Decreased activity tolerance, Decreased strength, Impaired flexibility, Pain  Visit Diagnosis: Chronic right shoulder pain - Plan: PT plan of care cert/re-cert  Muscle weakness (generalized) - Plan: PT plan of care cert/re-cert     Problem List Patient Active Problem List   Diagnosis Date Noted  . Posttraumatic stress disorder 12/09/2016  . Obesity (BMI 35.0-39.9 without comorbidity) 03/20/2015  . Leg edema, right 10/03/2014  . Genetic testing 07/10/2014  . Breast cancer of lower-inner quadrant of right female breast ([place]) 05/29/2014    Trotter,Margaret PT, DPT 02/09/2017, 11:53 AM  [place] MAIN [facility] SERVICES [address] [place], [place], [address] Phone: [phone]   Fax:  [phone]  Name: Krystal Kelley MRN: [id number] Date of Birth: 31-Dec-1970

## 2017-02-10 ENCOUNTER — Ambulatory Visit (HOSPITAL_COMMUNITY): Payer: Self-pay | Admitting: Licensed Clinical Social Worker

## 2017-02-14 ENCOUNTER — Emergency Department
Admission: EM | Admit: 2017-02-14 | Discharge: 2017-02-14 | Disposition: A | Discharge status: Home / Self Care | Payer: BLUE CROSS/BLUE SHIELD | Attending: Emergency Medicine | Admitting: Emergency Medicine

## 2017-02-14 ENCOUNTER — Ambulatory Visit: Payer: BLUE CROSS/BLUE SHIELD | Admitting: Podiatry

## 2017-02-14 DIAGNOSIS — Z79899 Other long term (current) drug therapy: Secondary | ICD-10-CM | POA: Insufficient documentation

## 2017-02-14 DIAGNOSIS — R51 Headache: Secondary | ICD-10-CM | POA: Insufficient documentation

## 2017-02-14 DIAGNOSIS — R220 Localized swelling, mass and lump, head: Secondary | ICD-10-CM

## 2017-02-14 DIAGNOSIS — R519 Headache, unspecified: Secondary | ICD-10-CM

## 2017-02-14 DIAGNOSIS — R600 Localized edema: Secondary | ICD-10-CM | POA: Diagnosis present

## 2017-02-14 MED ORDER — PREDNISONE 20 MG PO TABS
60.0000 mg | ORAL_TABLET | Freq: Once | ORAL | Status: AC
  Administered 2017-02-14: 60 mg via ORAL
  Filled 2017-02-14: qty 3

## 2017-02-14 MED ORDER — BUTALBITAL-APAP-CAFFEINE 50-325-40 MG PO TABS
2.0000 | ORAL_TABLET | Freq: Once | ORAL | Status: AC
  Administered 2017-02-14: 2 via ORAL
  Filled 2017-02-14: qty 2

## 2017-02-14 MED ORDER — PREDNISONE 10 MG PO TABS: 50.0000 mg | ORAL_TABLET | Freq: Every day | ORAL | 0 refills | Status: AC

## 2017-02-14 MED ORDER — BUTALBITAL-APAP-CAFFEINE 50-325-40 MG PO TABS: 1.0000 | ORAL_TABLET | Freq: Four times a day (QID) | ORAL | 0 refills | Status: DC | PRN

## 2017-02-14 MED ORDER — IBUPROFEN 600 MG PO TABS
600.0000 mg | ORAL_TABLET | Freq: Once | ORAL | Status: AC
  Administered 2017-02-14: 600 mg via ORAL
  Filled 2017-02-14: qty 1

## 2017-02-14 NOTE — ED Notes (Signed)
Pt states she has been suffering from hives for a month. Her eyes are swollen, watery, and itchy. Dr. Mable Paris and family at the bedside. Pt states she was in fashion show this weekend and she's not sure if maybe the makeup caused the reaction.

## 2017-02-14 NOTE — ED Provider Notes (Signed)
[facility] Emergency Department Provider Note  ____________________________________________   First MD Initiated Contact with Patient 02/14/17 2152     (approximate)  I have reviewed the triage vital signs and the nursing notes.   HISTORY  Chief Complaint Headache and Facial Swelling   HPI Krystal Kelley is a 46 y.o. female who self presents to the emergency department with 2 issues.  First she reports gradual onset not maximal onset right sided throbbing headache that came on insidiously this morning and has progressed throughout the day.  She denies double vision blurred vision numbness or weakness.  This feels identical to previous headaches.  She also reports atraumatic facial swelling on the right side of her face that began this morning.  She is concerned that this might be secondary to makeup that she were to fashion sure yesterday.  She says that for the past month or so she has been battling hives to the right side of her face that have responded to Benadryl and Zyrtec.  She denies shortness of breath.  She denies drooling.  Denies recent dental work.  She denies dental pain.  Past Medical History:  Diagnosis Date  . Anxiety   . Breast cancer, right breast ([place])   . Depression   . Peripheral edema    on lasix  . Radiation 10/30/14-12/17/14   right breast 50.4 gray    Patient Active Problem List   Diagnosis Date Noted  . Posttraumatic stress disorder 12/09/2016  . Obesity (BMI 35.0-39.9 without comorbidity) 03/20/2015  . Leg edema, right 10/03/2014  . Genetic testing 07/10/2014  . Breast cancer of lower-inner quadrant of right female breast ([place]) 05/29/2014    Past Surgical History:  Procedure Laterality Date  . BREAST BIOPSY  05/16/14  . BREAST LUMPECTOMY Right 06/18/2014  . bunion surgrery    . CARPAL TUNNEL RELEASE Left 09/21/2016   Procedure: LEFT CARPAL TUNNEL RELEASE;  Surgeon: Daryll Brod, MD;  Location: [facility];   Service: Orthopedics;  Laterality: Left;  REG/FAB  . carpel tunnel    . CHOLECYSTECTOMY  2015  . PLANTAR FASCIECTOMY    . PORTACATH PLACEMENT Left 06/18/2014   Procedure: INSERTION PORT-A-CATH;  Surgeon: Excell Seltzer, MD;  Location: [facility];  Service: General;  Laterality: Left;    Prior to Admission medications   Medication Sig Start Date End Date Taking? Authorizing Provider  BELVIQ 10 MG TABS Take 10 mg by mouth 2 (two) times daily. 01/05/17   [name], MD  cycloSPORINE (RESTASIS) 0.05 % ophthalmic emulsion Place 1 drop into both eyes 2 (two) times daily.    [provider]  Eluxadoline (VIBERZI) 100 MG TABS Take 1 tablet (100 mg total) by mouth 2 (two) times daily. 01/18/17   Lucilla Lame, MD  escitalopram (LEXAPRO) 10 MG tablet Take 1 tablet (10 mg total) by mouth daily. 01/18/17 01/18/18  Aundra Dubin, MD  furosemide (LASIX) 40 MG tablet TAKE ONE TABLET BY MOUTH EVERY DAY AS NEEDED 01/31/17   Nicholas Lose, MD  hydrOXYzine (VISTARIL) 25 MG capsule Take 1 capsule (25 mg total) by mouth at bedtime as needed (sleep). 02/09/17   Aundra Dubin, MD  tamoxifen (NOLVADEX) 20 MG tablet TAKE 1 TABLET BY MOUTH DAILY 11/10/16   Nicholas Lose, MD  Vitamin D, Ergocalciferol, (DRISDOL) 50000 units CAPS capsule TK 1 C PO ONCE A WEEK. REPEAT LAB AFTER COMPLETION 08/09/16   [provider]    Allergies Patient has no  known allergies.  Family History  Problem Relation Age of Onset  . Kidney cancer Father        kidney  . Prostate cancer Maternal Uncle        deceased 32  . Lung cancer Maternal Grandfather        deceased 67; smoker  . Colon cancer Paternal Grandfather        deceased 65s  . Cancer Other        pat half-sister; ? uterine vs. ovarian ca    Social History Social History  Substance Use Topics  . Smoking status: Never Smoker  . Smokeless tobacco: Never Used  . Alcohol use Yes     Comment: wine- occ     Review of  Systems Constitutional: No fever/chills Eyes: No visual changes. ENT: No sore throat. Cardiovascular: Denies chest pain. Respiratory: Denies shortness of breath. Gastrointestinal: No abdominal pain.  No nausea, no vomiting.  No diarrhea.  No constipation. Genitourinary: Negative for dysuria. Musculoskeletal: Negative for back pain. Skin: Positive for rash. Neurological: Positive for headache   ____________________________________________   PHYSICAL EXAM:  VITAL SIGNS: ED Triage Vitals [02/14/17 1956]  Enc Vitals Group     BP (!) 161/115     Pulse Rate 75     Resp 18     Temp 99.1 F (37.3 C)     Temp Source Oral     SpO2 100 %     Weight 244 lb (110.7 kg)     Height 5\' 5"  (1.651 m)     Head Circumference      Peak Flow      Pain Score      Pain Loc      Pain Edu?      Excl. in [place]?     Constitutional: Alert and oriented x4 joking laughing well-appearing nontoxic no diaphoresis speaks in full clear sentences Eyes: PERRL EOMI. Head: Mild swelling to the right cheek with faint erythema.. Nose: No congestion/rhinnorhea. Mouth/Throat: No trismus no dental tenderness Neck: No stridor.   Cardiovascular: Normal rate, regular rhythm. Grossly normal heart sounds.  Good peripheral circulation. Respiratory: Normal respiratory effort.  No retractions. Lungs CTAB and moving good air Gastrointestinal: Soft nontender Musculoskeletal: No lower extremity edema   Neurologic:  Normal speech and language. No gross focal neurologic deficits are appreciated. Skin: Faint erythema to right cheek Psychiatric: Mood and affect are normal. Speech and behavior are normal.    ____________________________________________   DIFFERENTIAL includes but not limited to  Allergic reaction, anaphylaxis, dental infection, dental pain, migraine headache ____________________________________________   LABS (all labs ordered are listed, but only abnormal results are displayed)  Labs Reviewed - No  data to display   __________________________________________  EKG   ____________________________________________  RADIOLOGY   ____________________________________________   PROCEDURES  Procedure(s) performed: no  Procedures  Critical Care performed: no  Observation: no ____________________________________________   INITIAL IMPRESSION / ASSESSMENT AND PLAN / ED COURSE  Pertinent labs & imaging results that were available during my care of the patient were reviewed by me and considered in my medical decision making (see chart for details).  The patient is very well-appearing hemodynamically stable and neurologically intact.  Her headache is gradual onset not maximal onset and identical to previous.  I do not believe she warrants advanced imaging at this time.  Regarding her facial swelling there is some uncertainty, however given the itchiness, erythema, and history of hives a very well may be allergic.  While she has  taken H1 blockers, she has not attempted steroids so I will give her a 5-day burst.  Headache improved after Fioricet.  I will discharge her also with a short course.  She is referred back to her primary care physician with strict return precautions.  The patient verbalizes understanding and agreement the plan.      ____________________________________________   FINAL CLINICAL IMPRESSION(S) / ED DIAGNOSES  Final diagnoses:  None      NEW MEDICATIONS STARTED DURING THIS VISIT:  New Prescriptions   No medications on file     Note:  This document was prepared using Dragon voice recognition software and may include unintentional dictation errors.     Darel Hong, MD 02/14/17 2246

## 2017-02-14 NOTE — ED Triage Notes (Signed)
Reports headache for 2 days.  Also reports facial swelling and itching since being in a fashion show and ? If it was the make.

## 2017-02-14 NOTE — Discharge Instructions (Signed)
Please take your steroids for the next 4 days as prescribed and follow-up with your primary care physician in 1 week for recheck.  Return to the emergency department for any concerns whatsoever.  It was a pleasure to take care of you today, and thank you for coming to our emergency department.  If you have any questions or concerns before leaving please ask the nurse to grab me and I'm more than happy to go through your aftercare instructions again.  If you were prescribed any opioid pain medication today such as Norco, Vicodin, Percocet, morphine, hydrocodone, or oxycodone please make sure you do not drive when you are taking this medication as it can alter your ability to drive safely.  If you have any concerns once you are home that you are not improving or are in fact getting worse before you can make it to your follow-up appointment, please do not hesitate to call 911 and come back for further evaluation.  Darel Hong, MD

## 2017-02-17 ENCOUNTER — Encounter (HOSPITAL_COMMUNITY): Payer: Self-pay | Admitting: Licensed Clinical Social Worker

## 2017-02-17 ENCOUNTER — Ambulatory Visit (INDEPENDENT_AMBULATORY_CARE_PROVIDER_SITE_OTHER): Payer: BLUE CROSS/BLUE SHIELD | Admitting: Licensed Clinical Social Worker

## 2017-02-17 DIAGNOSIS — F4310 Post-traumatic stress disorder, unspecified: Secondary | ICD-10-CM

## 2017-02-17 NOTE — Progress Notes (Signed)
   THERAPIST PROGRESS NOTE  Session Time: 10:10 - 11:00am  Participation Level: Active  Behavioral Response: Casual/ Upbeat  Type of Therapy: Individual Therapy  Treatment Goals addressed: Depressive symptoms   Interventions:  CBT  Summary: Krystal Kelley is a 47 y.o. female who presents with PTSD.   Suicidal/Homicidal: Nowithout intent/plan  Therapist ResponseAllyson Sabal met with clinician for an individual session. She discussed her psychiatric symptoms and current life events. Pt  Continues to anxiously looking for work. 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 Discussed options with pt.  She has been having headaches and went to the ER Monday night. Her BP was high. While here in the clinic pt had her BP taken and it was high again. It was suggested she call her PCP and get worked in asap. Taught pt emotional regulation skills. "check the facts" Pt struggles with overeacting in situations which seem unfair. Gave pt handout to complete in session and discussed.     Plan: Return again in 2 weeks   Diagnosis: Axis I: PTSD       MACKENZIE,LISBETH S, LCAS  02/17/17

## 2017-02-19 ENCOUNTER — Encounter: Payer: Self-pay | Admitting: Emergency Medicine

## 2017-02-19 ENCOUNTER — Emergency Department: Payer: BLUE CROSS/BLUE SHIELD

## 2017-02-19 ENCOUNTER — Emergency Department
Admission: EM | Admit: 2017-02-19 | Discharge: 2017-02-19 | Disposition: A | Discharge status: Home / Self Care | Payer: BLUE CROSS/BLUE SHIELD | Attending: Emergency Medicine | Admitting: Emergency Medicine

## 2017-02-19 DIAGNOSIS — R51 Headache: Secondary | ICD-10-CM | POA: Diagnosis present

## 2017-02-19 DIAGNOSIS — R519 Headache, unspecified: Secondary | ICD-10-CM

## 2017-02-19 DIAGNOSIS — Z853 Personal history of malignant neoplasm of breast: Secondary | ICD-10-CM | POA: Insufficient documentation

## 2017-02-19 DIAGNOSIS — Z79899 Other long term (current) drug therapy: Secondary | ICD-10-CM | POA: Diagnosis not present

## 2017-02-19 MED ORDER — PROCHLORPERAZINE EDISYLATE 5 MG/ML IJ SOLN
10.0000 mg | Freq: Once | INTRAMUSCULAR | Status: AC
  Administered 2017-02-19: 10 mg via INTRAVENOUS
  Filled 2017-02-19: qty 2

## 2017-02-19 MED ORDER — SODIUM CHLORIDE 0.9 % IV BOLUS (SEPSIS)
1000.0000 mL | Freq: Once | INTRAVENOUS | Status: AC
  Administered 2017-02-19: 1000 mL via INTRAVENOUS

## 2017-02-19 MED ORDER — DIPHENHYDRAMINE HCL 50 MG/ML IJ SOLN
25.0000 mg | Freq: Once | INTRAMUSCULAR | Status: AC
  Administered 2017-02-19: 25 mg via INTRAVENOUS
  Filled 2017-02-19: qty 1

## 2017-02-19 MED ORDER — KETOROLAC TROMETHAMINE 30 MG/ML IJ SOLN
30.0000 mg | Freq: Once | INTRAMUSCULAR | Status: AC
  Administered 2017-02-19: 30 mg via INTRAVENOUS
  Filled 2017-02-19: qty 1

## 2017-02-19 NOTE — ED Triage Notes (Signed)
Pt arrived via pov with complaints of headache. Pt reports pain starts in right eye and radiates to back of her head. Pt reports sensitivity to light and sound. Pt reports headache has been persistant for the last week and was given pain medication on Monday but has had no relief.

## 2017-02-19 NOTE — ED Notes (Signed)
Patient reports being seen in this ED Monday for headache and facial swelling. She believes the swelling was due to makeup worn on Sunday. Per patient, patient was hypertensive on arrival to ED with systolic BP of 659. Patient was prescribed prednisone and pain medication.  On Thursday patient's BP at therapist was 180/100 and patient again had headache.  Patient called PCP, has appointment for Monday but is unable to tolerate the pain.  Patient c/o headache that worsens when leaning forward or back - reports she feels increased pressure with movement. Patient reports light sensitivity.

## 2017-02-19 NOTE — ED Provider Notes (Signed)
[facility] Emergency Department Provider Note  ____________________________________________   First MD Initiated Contact with Patient 02/19/17 [phone]     (approximate)  I have reviewed the triage vital signs and the nursing notes.   HISTORY  Chief Complaint Headache   HPI Krystal Kelley is a 46 y.o. female with a history of breast cancer as well as anxiety who is presenting to the emergency department today for headache over the past 1 week.  She says that her headache has been gradually worsening ever since she was treated for an allergic reaction to makeup this past Sunday.  She is been taking prednisone and was also given Fioricet.  She had a dose of ears at this morning prior to arrival to the emergency department which she says is improved her pain some but is still experiencing a 5 out of 10, throbbing pain over the right side of her head which radiates from her forehead all the way to the occiput.  She reports photophobia but is denying any vomiting.  Patient says that she is 1 year in remission from breast cancer.  Patient says that the pain is worse with movement of her head but does not say that the pain is any worse in any one point of time in the day.    Past Medical History:  Diagnosis Date  . Anxiety   . Breast cancer, right breast ([place])   . Depression   . Peripheral edema    on lasix  . Radiation 10/30/14-12/17/14   right breast 50.4 gray    Patient Active Problem List   Diagnosis Date Noted  . Posttraumatic stress disorder 12/09/2016  . Obesity (BMI 35.0-39.9 without comorbidity) 03/20/2015  . Leg edema, right 10/03/2014  . Genetic testing 07/10/2014  . Breast cancer of lower-inner quadrant of right female breast ([place]) 05/29/2014    Past Surgical History:  Procedure Laterality Date  . BREAST BIOPSY  05/16/14  . BREAST LUMPECTOMY Right 06/18/2014  . bunion surgrery    . CARPAL TUNNEL RELEASE Left 09/21/2016   Procedure: LEFT CARPAL  TUNNEL RELEASE;  Surgeon: Daryll Brod, MD;  Location: [facility];  Service: Orthopedics;  Laterality: Left;  REG/FAB  . carpel tunnel    . CHOLECYSTECTOMY  2015  . PLANTAR FASCIECTOMY    . PORTACATH PLACEMENT Left 06/18/2014   Procedure: INSERTION PORT-A-CATH;  Surgeon: Excell Seltzer, MD;  Location: [facility];  Service: General;  Laterality: Left;    Prior to Admission medications   Medication Sig Start Date End Date Taking? Authorizing Provider  BELVIQ 10 MG TABS Take 10 mg by mouth 2 (two) times daily. 01/05/17   [name], MD  butalbital-acetaminophen-caffeine (FIORICET, ESGIC) [id number] MG tablet Take 1-2 tablets by mouth every 6 (six) hours as needed for headache. 02/14/17 02/14/18  Darel Hong, MD  cycloSPORINE (RESTASIS) 0.05 % ophthalmic emulsion Place 1 drop into both eyes 2 (two) times daily.    [provider]  Eluxadoline (VIBERZI) 100 MG TABS Take 1 tablet (100 mg total) by mouth 2 (two) times daily. 01/18/17   Lucilla Lame, MD  escitalopram (LEXAPRO) 10 MG tablet Take 1 tablet (10 mg total) by mouth daily. 01/18/17 01/18/18  Aundra Dubin, MD  furosemide (LASIX) 40 MG tablet TAKE ONE TABLET BY MOUTH EVERY DAY AS NEEDED 01/31/17   Nicholas Lose, MD  hydrOXYzine (VISTARIL) 25 MG capsule Take 1 capsule (25 mg total) by mouth at bedtime as needed (sleep). 02/09/17  Eksir, Richard Miu, MD  tamoxifen (NOLVADEX) 20 MG tablet TAKE 1 TABLET BY MOUTH DAILY 11/10/16   Nicholas Lose, MD  Vitamin D, Ergocalciferol, (DRISDOL) 50000 units CAPS capsule TK 1 C PO ONCE A WEEK. REPEAT LAB AFTER COMPLETION 08/09/16   [provider]    Allergies Patient has no known allergies.  Family History  Problem Relation Age of Onset  . Kidney cancer Father        kidney  . Prostate cancer Maternal Uncle        deceased 75  . Lung cancer Maternal Grandfather        deceased 110; smoker  . Colon cancer Paternal Grandfather         deceased 51s  . Cancer Other        pat half-sister; ? uterine vs. ovarian ca    Social History Social History  Substance Use Topics  . Smoking status: Never Smoker  . Smokeless tobacco: Never Used  . Alcohol use Yes     Comment: wine- occ     Review of Systems  Constitutional: No fever/chills Eyes: as above ENT: No sore throat. Cardiovascular: Denies chest pain. Respiratory: Denies shortness of breath. Gastrointestinal: No abdominal pain.  No nausea, no vomiting.  No diarrhea.  No constipation. Genitourinary: Negative for dysuria. Musculoskeletal: Negative for back pain. Skin: Negative for rash. Neurological: Negative forfocal weakness or numbness.   ____________________________________________   PHYSICAL EXAM:  VITAL SIGNS: ED Triage Vitals  Enc Vitals Group     BP 02/19/17 0456 (!) 176/97     Pulse Rate 02/19/17 0612 66     Resp 02/19/17 0456 18     Temp 02/19/17 0456 98.5 F (36.9 C)     Temp Source 02/19/17 0456 Oral     SpO2 02/19/17 0456 98 %     Weight 02/19/17 0456 244 lb (110.7 kg)     Height 02/19/17 0456 5\' 5"  (1.651 m)     Head Circumference --      Peak Flow --      Pain Score 02/19/17 0458 8     Pain Loc --      Pain Edu? --      Excl. in [place]? --     Constitutional: Alert and oriented. Well appearing and in no acute distress. Eyes: Conjunctivae are normal.  Head: Atraumatic. Nose: No congestion/rhinnorhea. Mouth/Throat: Mucous membranes are moist.  Neck: No stridor.   Cardiovascular: Normal rate, regular rhythm. Grossly normal heart sounds.   Respiratory: Normal respiratory effort.  No retractions. Lungs CTAB. Gastrointestinal: Soft and nontender. No distention.  Musculoskeletal: No lower extremity tenderness nor edema.  No joint effusions. Neurologic:  Normal speech and language. No gross focal neurologic deficits are appreciated. Skin:  Skin is warm, dry and intact. No rash noted. Psychiatric: Mood and affect are normal. Speech and  behavior are normal.  ____________________________________________   LABS (all labs ordered are listed, but only abnormal results are displayed)  Labs Reviewed - No data to display ____________________________________________  EKG   ____________________________________________  RADIOLOGY  Patient without any acute findings on the head CT. ____________________________________________   PROCEDURES  Procedure(s) performed:   Procedures  Critical Care performed:   ____________________________________________   INITIAL IMPRESSION / ASSESSMENT AND PLAN / ED COURSE  Pertinent labs & imaging results that were available during my care of the patient were reviewed by me and considered in my medical decision making (see chart for details).  Differential diagnosis includes, but is not  limited to, intracranial hemorrhage, meningitis/encephalitis, previous head trauma, cavernous venous thrombosis, tension headache, temporal arteritis, migraine or migraine equivalent, idiopathic intracranial hypertension, and non-specific headache.  As part of my medical decision making, I reviewed the following data within the electronic MEDICAL RECORD NUMBER Notes from prior ED visits  ----------------------------------------- 9:59 AM on 02/19/2017 -----------------------------------------  Patient is pain-free at this time.  Most likely primary cephalgia.  No evidence of secondary cephalgia.  Headache was gradual onset over the past week.  No neuro deficits.  Reassuring CT scan.  Patient aware of the CT results as well as plan for discharge to home.  Patient to continue Fioricet as needed at home.      ____________________________________________   FINAL CLINICAL IMPRESSION(S) / ED DIAGNOSES  Headache    NEW MEDICATIONS STARTED DURING THIS VISIT:  New Prescriptions   No medications on file     Note:  This document was prepared using Dragon voice recognition software and may include  unintentional dictation errors.     Orbie Pyo, MD 02/19/17 1000

## 2017-02-23 ENCOUNTER — Other Ambulatory Visit: Payer: Self-pay | Admitting: Hematology and Oncology

## 2017-02-23 DIAGNOSIS — C50311 Malignant neoplasm of lower-inner quadrant of right female breast: Secondary | ICD-10-CM

## 2017-02-24 ENCOUNTER — Ambulatory Visit (INDEPENDENT_AMBULATORY_CARE_PROVIDER_SITE_OTHER): Payer: BLUE CROSS/BLUE SHIELD | Admitting: Licensed Clinical Social Worker

## 2017-02-24 DIAGNOSIS — F4310 Post-traumatic stress disorder, unspecified: Secondary | ICD-10-CM | POA: Diagnosis not present

## 2017-02-28 ENCOUNTER — Encounter: Payer: Self-pay | Admitting: Podiatry

## 2017-02-28 ENCOUNTER — Telehealth: Payer: Self-pay | Admitting: *Deleted

## 2017-02-28 ENCOUNTER — Ambulatory Visit (INDEPENDENT_AMBULATORY_CARE_PROVIDER_SITE_OTHER): Payer: BLUE CROSS/BLUE SHIELD | Admitting: Podiatry

## 2017-02-28 DIAGNOSIS — M21621 Bunionette of right foot: Secondary | ICD-10-CM | POA: Diagnosis not present

## 2017-02-28 DIAGNOSIS — M722 Plantar fascial fibromatosis: Secondary | ICD-10-CM | POA: Diagnosis not present

## 2017-02-28 NOTE — Telephone Encounter (Signed)
"  I'm a patient of Dr. Roselind Messier here in [place], [place].  I was calling you to set up a foot surgery.  I'm having a Tailors Bunion removed on my right foot and a screw out of the Tailor on the pinkie toe side on the left foot.  Give me a call.  The last four of my social is 8."

## 2017-02-28 NOTE — Patient Instructions (Signed)
Pre-Operative Instructions  Congratulations, you have decided to take an important step towards improving your quality of life.  You can be assured that the doctors and staff at [facility] will be with you every step of the way.  Here are some important things you should know:  1. Plan to be at the surgery center/hospital at least 1 (one) hour prior to your scheduled time, unless otherwise directed by the surgical center/hospital staff.  You must have a responsible adult accompany you, remain during the surgery and drive you home.  Make sure you have directions to the surgical center/hospital to ensure you arrive on time. 2. If you are having surgery at [facility] or [facility] hospitals, you will need a copy of your medical history and physical form from your family physician within one month prior to the date of surgery. We will give you a form for your primary physician to complete.  3. We make every effort to accommodate the date you request for surgery.  However, there are times where surgery dates or times have to be moved.  We will contact you as soon as possible if a change in schedule is required.   4. No aspirin/ibuprofen for one week before surgery.  If you are on aspirin, any non-steroidal anti-inflammatory medications (Mobic, Aleve, Ibuprofen) should not be taken seven (7) days prior to your surgery.  You make take Tylenol for pain prior to surgery.  5. Medications - If you are taking daily heart and blood pressure medications, seizure, reflux, allergy, asthma, anxiety, pain or diabetes medications, make sure you notify the surgery center/hospital before the day of surgery so they can tell you which medications you should take or avoid the day of surgery. 6. No food or drink after midnight the night before surgery unless directed otherwise by surgical center/hospital staff. 7. No alcoholic beverages 24-hours prior to surgery.  No smoking 24-hours prior or 24-hours after  surgery. 8. Wear loose pants or shorts. They should be loose enough to fit over bandages, boots, and casts. 9. Don't wear slip-on shoes. Sneakers are preferred. 10. Bring your boot with you to the surgery center/hospital.  Also bring crutches or a walker if your physician has prescribed it for you.  If you do not have this equipment, it will be provided for you after surgery. 11. If you have not been contacted by the surgery center/hospital by the day before your surgery, call to confirm the date and time of your surgery. 12. Leave-time from work may vary depending on the type of surgery you have.  Appropriate arrangements should be made prior to surgery with your employer. 13. Prescriptions will be provided immediately following surgery by your doctor.  Fill these as soon as possible after surgery and take the medication as directed. Pain medications will not be refilled on weekends and must be approved by the doctor. 14. Remove nail polish on the operative foot and avoid getting pedicures prior to surgery. 15. Wash the night before surgery.  The night before surgery wash the foot and leg well with water and the antibacterial soap provided. Be sure to pay special attention to beneath the toenails and in between the toes.  Wash for at least three (3) minutes. Rinse thoroughly with water and dry well with a towel.  Perform this wash unless told not to do so by your physician.  Enclosed: 1 Ice pack (please put in freezer the night before surgery)   1 Hibiclens skin cleaner     Pre-op instructions  If you have any questions regarding the instructions, please do not hesitate to call our office.  [place]: [address], [place], [place] [address] -- [phone]  [place]: [address] , [place] [address] -- [phone]  [place]: [address]  [place], [place] [address] -- [phone]  [place]: [address], [address], [place], [place] [address] -- [phone]  Website:  [email or web address] 

## 2017-02-28 NOTE — Progress Notes (Signed)
He presents today for follow-up of her plantar fasciitis. She states that they're really hurting. She would also like to go ahead and consider surgery. In discussing for the past year and a half regarding her painful internal fixation fifth metatarsal of the left foot a small exostosis to the first metatarsal left foot as well as a fifth metatarsal head osteotomy with screw fixation.  Objective: Vital signs temperature a oriented 3. Pulse are palpable. She has pain on palpation medial calcaneal tubercles bilaterally. She has pain on palpation of the fifth metatarsal head of the left foot. Medial exostosis first metatarsal left foot. And if painful fifth metatarsal tailor's bunion deformity right foot. Pain on palpation medial calcaneal tubercle right greater than left  Assessment: Painful internal fixation tailor's bunion deformity exostosis. Plantar fasciitis bilateral.  Plan: Discussed etiology and pathology conservative or surgical therapies. Injected bilateral heel today with Kenalog and local anesthetic consented her for fifth metatarsal osteotomy and screw right foot removal internal fixation fifth metatarsal left foot and exostectomy first metatarsal left foot. Answered all her questions regarding these procedures at best mobility in layman's terms. She understands that and is amenable to it. She signed off and patient consent form and I will follow-up with her in the near future for surgical intervention. He

## 2017-03-01 ENCOUNTER — Encounter (HOSPITAL_COMMUNITY): Payer: Self-pay | Admitting: Licensed Clinical Social Worker

## 2017-03-01 NOTE — Telephone Encounter (Signed)
I attempted to return her call.  Her voicemail was full, I couldn't leave a message.

## 2017-03-01 NOTE — Progress Notes (Signed)
   THERAPIST PROGRESS NOTE  Session Time: 11:15-11:45am  Participation Level: Active  Behavioral Response: Casual/ Upbeat  Type of Therapy: Individual Therapy  Treatment Goals addressed: Depressive symptoms  Interventions:  CBT  Summary: Krystal Kelley is a 46 y.o. female who presents with PTSD.   Suicidal/Homicidal: Nowithout intent/plan  Therapist ResponseAllyson Kelley met with clinician for an individual session. She discussed her psychiatric symptoms and current life events. Pt  Continues to anxiously looking for work. She is frustrated that she has not found a job. 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 Discussed options with pt.  She has been having headaches and went to the ER Saturday night. Her BP was high. Pt reports she went to her PCP and they are working with her on her BP.  Again worked with pt on emotional regulation skills. "check the facts" Pt struggles with overeacting in situations which seem unfair. Pt completed a worksheet on emotional regulation skills, reviewed with pt in session.     Plan: Return again in 2 weeks   Diagnosis: Axis I: PTSD       Selda Jalbert S, LCAS  02/24/17

## 2017-03-02 NOTE — Telephone Encounter (Signed)
"  I think I missed your call.  Give me a call back."

## 2017-03-03 ENCOUNTER — Ambulatory Visit (HOSPITAL_COMMUNITY): Payer: Self-pay | Admitting: Licensed Clinical Social Worker

## 2017-03-03 NOTE — Telephone Encounter (Signed)
I'm returning your call.  Do you have a date in mind that you would like to schedule the surgery?  "I'd like to do it as soon as possible after Thanksgiving but before Christmas."  The earliest Dr. Milinda Pointer can do it is December 28.  "Well I guess that will have to work.  Can you let me know if there's any cancellations for an earlier date?"  Yes, I'll put you on a waiting list.

## 2017-03-08 ENCOUNTER — Ambulatory Visit (INDEPENDENT_AMBULATORY_CARE_PROVIDER_SITE_OTHER): Payer: BLUE CROSS/BLUE SHIELD | Admitting: Licensed Clinical Social Worker

## 2017-03-08 ENCOUNTER — Encounter (HOSPITAL_COMMUNITY): Payer: Self-pay | Admitting: Licensed Clinical Social Worker

## 2017-03-08 DIAGNOSIS — Z5689 Other problems related to employment: Secondary | ICD-10-CM

## 2017-03-08 DIAGNOSIS — F4310 Post-traumatic stress disorder, unspecified: Secondary | ICD-10-CM | POA: Diagnosis not present

## 2017-03-08 NOTE — Progress Notes (Signed)
   THERAPIST PROGRESS NOTE  Session Time: 48:01KP-53:74MO  Participation Level: Active  Behavioral Response: Alert/Casual/ Tired  Type of Therapy: Individual Therapy  Treatment Goals addressed: Depressive symptoms  Interventions:  CBT  Summary: Krystal Kelley is a 46 y.o. female who presents with PTSD.   Suicidal/Homicidal: Nowithout intent/plan  Therapist ResponseAllyson Sabal met with clinician for an individual session. She discussed her psychiatric symptoms and current life events. Pt  Continues to anxiously looking for work. She is still frustrated that she has not found a job.Suggested to pt that her priority should be job hunting. She seems to have a lot of excuses why it is not a priority. Pt presented looking tired, asked pt open ended questions. She is not resting well at night. She does not get any REM sleep because she doesn't have any uninterrupted sleep. Taught pt about positive sleep habits. Gave pt handout. Pt lacks insight into her anxiety and depressive symptoms and patterns of her dysfunctional behavior.      Plan: Return again in 2 weeks   Diagnosis: Axis I: PTSD       , S, LCAS  03/08/17

## 2017-03-15 ENCOUNTER — Ambulatory Visit (HOSPITAL_COMMUNITY): Payer: Self-pay | Admitting: Licensed Clinical Social Worker

## 2017-03-24 ENCOUNTER — Ambulatory Visit (HOSPITAL_COMMUNITY): Payer: Self-pay | Admitting: Licensed Clinical Social Worker

## 2017-03-25 ENCOUNTER — Ambulatory Visit (HOSPITAL_COMMUNITY): Payer: Self-pay | Admitting: Licensed Clinical Social Worker

## 2017-03-29 ENCOUNTER — Ambulatory Visit (HOSPITAL_COMMUNITY): Payer: Self-pay | Admitting: Psychiatry

## 2017-03-31 ENCOUNTER — Ambulatory Visit (HOSPITAL_COMMUNITY): Payer: Self-pay | Admitting: Licensed Clinical Social Worker

## 2017-04-07 ENCOUNTER — Ambulatory Visit (HOSPITAL_COMMUNITY): Payer: Self-pay | Admitting: Licensed Clinical Social Worker

## 2017-04-11 ENCOUNTER — Other Ambulatory Visit: Payer: Self-pay | Admitting: Hematology and Oncology

## 2017-04-11 DIAGNOSIS — C50311 Malignant neoplasm of lower-inner quadrant of right female breast: Secondary | ICD-10-CM

## 2017-04-14 ENCOUNTER — Telehealth (HOSPITAL_COMMUNITY): Payer: Self-pay

## 2017-04-14 ENCOUNTER — Other Ambulatory Visit: Payer: Self-pay | Admitting: Podiatry

## 2017-04-14 ENCOUNTER — Ambulatory Visit (INDEPENDENT_AMBULATORY_CARE_PROVIDER_SITE_OTHER): Payer: BLUE CROSS/BLUE SHIELD | Admitting: Licensed Clinical Social Worker

## 2017-04-14 DIAGNOSIS — F4310 Post-traumatic stress disorder, unspecified: Secondary | ICD-10-CM | POA: Diagnosis not present

## 2017-04-14 MED ORDER — CEPHALEXIN 500 MG PO CAPS: 500.0000 mg | ORAL_CAPSULE | Freq: Three times a day (TID) | ORAL | 0 refills | Status: DC

## 2017-04-14 MED ORDER — PROMETHAZINE HCL 25 MG PO TABS: 25.0000 mg | ORAL_TABLET | Freq: Three times a day (TID) | ORAL | 0 refills | Status: DC | PRN

## 2017-04-14 MED ORDER — OXYCODONE-ACETAMINOPHEN 10-325 MG PO TABS: 1.0000 | ORAL_TABLET | ORAL | 0 refills | Status: DC | PRN

## 2017-04-14 NOTE — Telephone Encounter (Signed)
Medication refill request - Fax received from patient's [facility] for a new Escitalopram order, last provided 01/18/17 + 2 refills and patient does not return until 06/21/16.

## 2017-04-15 ENCOUNTER — Encounter (HOSPITAL_COMMUNITY): Payer: Self-pay | Admitting: Licensed Clinical Social Worker

## 2017-04-15 ENCOUNTER — Telehealth: Payer: Self-pay | Admitting: Podiatry

## 2017-04-15 ENCOUNTER — Encounter: Payer: Self-pay | Admitting: Podiatry

## 2017-04-15 DIAGNOSIS — M21542 Acquired clubfoot, left foot: Secondary | ICD-10-CM | POA: Diagnosis not present

## 2017-04-15 DIAGNOSIS — Z4889 Encounter for other specified surgical aftercare: Secondary | ICD-10-CM | POA: Diagnosis not present

## 2017-04-15 DIAGNOSIS — M25775 Osteophyte, left foot: Secondary | ICD-10-CM | POA: Diagnosis not present

## 2017-04-15 NOTE — Progress Notes (Signed)
   THERAPIST PROGRESS NOTE  Session Time: 81:85TM-93:11ET  Participation Level: Active  Behavioral Response: Alert/Casual/ Tired  Type of Therapy: Individual Therapy  Treatment Goals addressed: Depressive symptoms  Interventions:  CBT  Summary: Krystal Kelley is a 46 y.o. female who presents with PTSD.   Suicidal/Homicidal: Nowithout intent/plan  Therapist ResponseAllyson Sabal met with clinician for an individual session. She discussed her psychiatric symptoms and current life events. Discussed with pt about her continued by in with therapy. She has missed several appointments. Pt has many excuses of why she misses her appointments. Pt has been sick with URI and it is still lingering. Pt does not participate in self care. Taught pt components of self care that will assist in her well being. Suggested pt use meditation and breathing exercises as part of her self care regime.          Plan: Return again in 2 weeks   Diagnosis: Axis I: PTSD       Deondrea Aguado S, LCAS  04/14/17

## 2017-04-15 NOTE — Telephone Encounter (Signed)
I had surgery today with Dr. Milinda Pointer. I need to know if I can take this boot off especially if I'm in bed. You can call me at [phone]. Thank you.

## 2017-04-20 ENCOUNTER — Ambulatory Visit (INDEPENDENT_AMBULATORY_CARE_PROVIDER_SITE_OTHER): Payer: BLUE CROSS/BLUE SHIELD | Admitting: Podiatry

## 2017-04-20 ENCOUNTER — Ambulatory Visit (INDEPENDENT_AMBULATORY_CARE_PROVIDER_SITE_OTHER): Payer: Medicaid Other

## 2017-04-20 ENCOUNTER — Encounter: Payer: Self-pay | Admitting: Podiatry

## 2017-04-20 VITALS — BP 122/77 | HR 81 | Temp 97.1°F | Resp 16

## 2017-04-20 DIAGNOSIS — M722 Plantar fascial fibromatosis: Secondary | ICD-10-CM | POA: Diagnosis not present

## 2017-04-20 DIAGNOSIS — M21621 Bunionette of right foot: Secondary | ICD-10-CM

## 2017-04-20 NOTE — Progress Notes (Signed)
She presents today 1 week status post removal internal fixation fifth metatarsal left exostectomy first metatarsal left fifth metatarsal osteotomy right foot with screws.  She states that she has been doing great.  She continues to take her pain medicine as needed.  Objective: Vital signs are stable she is alert and oriented x3.  Pulses are palpable.  Neurologic sensorium is intact.  Deep tendon reflexes are intact.  Muscle strength is normal symmetrical bilateral.  No erythema cellulitis drainage or odor incision sites appear to be healing very nicely margins appear to be coapting nicely.  Radiographs taken today demonstrate complete removal of internal fixation fifth metatarsal left foot exostectomy first metatarsal left foot fifth metatarsal osteotomy with double screw fixation right foot.  Well-healing surgical foot bilateral.  Plan: Redressed today dressed a compressive dressing follow-up with me in 1 week for suture removal if possible. Assessment:

## 2017-04-25 ENCOUNTER — Other Ambulatory Visit (HOSPITAL_COMMUNITY): Payer: Self-pay

## 2017-04-25 DIAGNOSIS — F4312 Post-traumatic stress disorder, chronic: Secondary | ICD-10-CM

## 2017-04-25 MED ORDER — ESCITALOPRAM OXALATE 10 MG PO TABS: 10.0000 mg | ORAL_TABLET | Freq: Every day | ORAL | 2 refills | Status: DC

## 2017-04-27 ENCOUNTER — Encounter: Payer: Self-pay | Admitting: Podiatry

## 2017-04-27 ENCOUNTER — Ambulatory Visit (INDEPENDENT_AMBULATORY_CARE_PROVIDER_SITE_OTHER): Payer: Medicaid Other | Admitting: Podiatry

## 2017-04-27 DIAGNOSIS — M722 Plantar fascial fibromatosis: Secondary | ICD-10-CM

## 2017-04-27 DIAGNOSIS — M21621 Bunionette of right foot: Secondary | ICD-10-CM

## 2017-04-27 DIAGNOSIS — T847XXD Infection and inflammatory reaction due to other internal orthopedic prosthetic devices, implants and grafts, subsequent encounter: Secondary | ICD-10-CM

## 2017-04-27 DIAGNOSIS — M898X9 Other specified disorders of bone, unspecified site: Secondary | ICD-10-CM

## 2017-04-27 NOTE — Progress Notes (Signed)
She presents today for her second postop visit status post removal of internal fixation first and fifth left foot and fifth metatarsal osteotomy on the right foot she is doing very well with this she denies fever chills nausea vomiting muscle aches and pains states that her left heel has started to bother her once again.  She is known to [place] for plantar fasciitis.  Objective: Vital signs are stable she is alert and oriented x3.  Pulses are palpable.  She has pain on palpation medial continue tubercles of her left heel.  Sutures are intact margins well coapted these sutures were removed today margins remain well coapted.  There is mild edema no erythema cellulitis drainage or odor however.  Assessment: Well-healing surgical feet bilaterally.  Plantar fasciitis resolving left.  Plan: Injected the left heel today with 20 mg of Kenalog 5 mg Marcaine after sterile Betadine skin prep and verbal permission.  She tolerated procedure well without complications.  Removed all of her sutures today placed her in compression dressings and allow her to get back into a tennis shoe on the left foot and a Darco shoe on the right foot.  On follow-up with her in 2 weeks she will call if there are any questions or concerns.

## 2017-04-28 ENCOUNTER — Ambulatory Visit (HOSPITAL_COMMUNITY): Payer: Self-pay | Admitting: Licensed Clinical Social Worker

## 2017-04-28 ENCOUNTER — Telehealth: Payer: Self-pay | Admitting: *Deleted

## 2017-04-28 ENCOUNTER — Telehealth: Payer: Self-pay | Admitting: Podiatry

## 2017-04-28 NOTE — Telephone Encounter (Signed)
Pt states she missed a call from Dr. Milinda Pointer and she had wanted her to have an appt tomorrow to check on the surgery site seeping after her shower. Pt states she was not able to get the [place] office to answer.

## 2017-04-29 ENCOUNTER — Ambulatory Visit (INDEPENDENT_AMBULATORY_CARE_PROVIDER_SITE_OTHER): Payer: BLUE CROSS/BLUE SHIELD | Admitting: Podiatry

## 2017-04-29 DIAGNOSIS — Z9889 Other specified postprocedural states: Secondary | ICD-10-CM

## 2017-04-29 NOTE — Telephone Encounter (Signed)
Made apt with Dr.Evans today at 10am

## 2017-05-02 NOTE — Progress Notes (Signed)
   Subjective:  Patient presents today status post bunionectomy and tailor's bunionectomy of the left foot and tailor's bunionectomy of the right foot. DOS: 04/15/17.  Patient states she took a shower 2 days ago and has noticed that the incision is on her right foot more open since.  Patient is here for further evaluation and treatment.   Past Medical History:  Diagnosis Date  . Anxiety   . Breast cancer, right breast ([place])   . Depression   . Peripheral edema    on lasix  . Radiation 10/30/14-12/17/14   right breast 50.4 gray      Objective/Physical Exam Neurovascular status intact.  Skin incisions appear to be well coapted. No sign of infectious process noted. No dehiscence. No active bleeding noted. Moderate edema noted to the surgical extremity.   Assessment: 1. s/p bunionectomy and tailor's bunionectomy left. DOS: 04/15/17 2. s/p tailor's bunionectomy right.  DOS: 04/15/17   Plan of Care:  1.  Patient was evaluated.  2.  Continue wearing postop shoe on the right foot and sneaker on the left. 3.  Assured patient she is improving and incisions look good. 4.  Return to clinic in next scheduled appointment with Dr. Milinda Pointer.   Edrick Kins, DPM [facility]  Dr. Edrick Kins, [place]                                        [place], [place] [address]                Office [phone]  Fax [phone]

## 2017-05-05 ENCOUNTER — Ambulatory Visit (HOSPITAL_COMMUNITY): Payer: Self-pay | Admitting: Licensed Clinical Social Worker

## 2017-05-08 ENCOUNTER — Other Ambulatory Visit: Payer: Self-pay | Admitting: Hematology and Oncology

## 2017-05-08 DIAGNOSIS — C50311 Malignant neoplasm of lower-inner quadrant of right female breast: Secondary | ICD-10-CM

## 2017-05-12 ENCOUNTER — Ambulatory Visit (INDEPENDENT_AMBULATORY_CARE_PROVIDER_SITE_OTHER): Payer: Medicaid Other | Admitting: Licensed Clinical Social Worker

## 2017-05-12 ENCOUNTER — Encounter (HOSPITAL_COMMUNITY): Payer: Self-pay | Admitting: Licensed Clinical Social Worker

## 2017-05-12 DIAGNOSIS — F4310 Post-traumatic stress disorder, unspecified: Secondary | ICD-10-CM | POA: Diagnosis not present

## 2017-05-12 NOTE — Progress Notes (Signed)
   THERAPIST PROGRESS NOTE  Session Time: 79:48AX-65:53ZS  Participation Level: Active  Behavioral Response: Alert/Casual/ Tired  Type of Therapy: Individual Therapy  Treatment Goals addressed: Depressive symptoms  Interventions:  DBT/MI  Summary: Krystal Kelley is a 47 y.o. female who presents with PTSD.   Suicidal/Homicidal: Nowithout intent/plan  Therapist ResponseAllyson Kelley met with clinician for an individual session. She discussed her psychiatric symptoms and current life events. Pt brought her journal in to read from. Discussed vulnerability with pt. Vulnerability vs. Weakness. Pt was distraught in the session. She is guardian to her daughter since 99mo old. The biological father of the child is inquiring about custody of the child. Taught pt distress tolerance skills. Pt was able to describe her feelings when prompted about the custody issues. This is rare for pt and she struggles with her emotions. Pt reports she is still job hunting but is becoming discouraged. Validated pt's feelings. Encouraged pt to continue to journal.          Plan: Return again in 2 weeks   Diagnosis: Axis I: PTSD       MACKENZIE,LISBETH S, LCAS  1/247/19

## 2017-05-16 ENCOUNTER — Ambulatory Visit (INDEPENDENT_AMBULATORY_CARE_PROVIDER_SITE_OTHER): Payer: Medicaid Other

## 2017-05-16 ENCOUNTER — Ambulatory Visit (INDEPENDENT_AMBULATORY_CARE_PROVIDER_SITE_OTHER): Payer: Medicaid Other | Admitting: Podiatry

## 2017-05-16 ENCOUNTER — Encounter: Payer: Self-pay | Admitting: Podiatry

## 2017-05-16 DIAGNOSIS — T847XXD Infection and inflammatory reaction due to other internal orthopedic prosthetic devices, implants and grafts, subsequent encounter: Secondary | ICD-10-CM

## 2017-05-16 DIAGNOSIS — M21621 Bunionette of right foot: Secondary | ICD-10-CM | POA: Diagnosis not present

## 2017-05-16 DIAGNOSIS — M898X9 Other specified disorders of bone, unspecified site: Secondary | ICD-10-CM

## 2017-05-16 NOTE — Progress Notes (Signed)
She presents today date of surgery 04/15/2017 status post fifth metatarsal osteotomy right foot and removal internal fixation first metatarsal and fifth metatarsal left foot.  She states that she is doing quite well.  She is ready to get out of this shoe.  Objective: Vital signs are stable alert and oriented x3.  Pulses are palpable.  Surgical sites have gone on to heal uneventfully.  Radiographs of the fifth digit of the right foot demonstrate osteotomy appears to be healing but not healed as of yet.  Internal fixation is in good position.  No signs of infection to this right foot.  Assessment: Well-healing surgical foot.  Plan: Follow-up with me in 3 weeks for another set of x-rays.

## 2017-05-19 ENCOUNTER — Ambulatory Visit (HOSPITAL_COMMUNITY): Payer: Self-pay | Admitting: Licensed Clinical Social Worker

## 2017-05-26 ENCOUNTER — Encounter (HOSPITAL_COMMUNITY): Payer: Self-pay | Admitting: Licensed Clinical Social Worker

## 2017-05-26 ENCOUNTER — Ambulatory Visit (INDEPENDENT_AMBULATORY_CARE_PROVIDER_SITE_OTHER): Payer: Medicaid Other | Admitting: Licensed Clinical Social Worker

## 2017-05-26 DIAGNOSIS — F4310 Post-traumatic stress disorder, unspecified: Secondary | ICD-10-CM

## 2017-05-26 NOTE — Progress Notes (Signed)
   THERAPIST PROGRESS NOTE  Session Time: 34:28JG-81:15BW  Participation Level: Active  Behavioral Response: Alert/Casual/ Tired  Type of Therapy: Individual Therapy  Treatment Goals addressed: Depressive symptoms  Interventions:  DBT/MI  Summary: KELAIAH ESCALONA is a 47 y.o. female who presents with PTSD.   Suicidal/Homicidal: Nowithout intent/plan  Therapist ResponseAllyson Sabal met with clinician for an individual session. She discussed her psychiatric symptoms and current life events. Pt forgot her journal today but reports she has been writing in it. Continued the discussion on vulnerability with pt. Vulnerability vs. Weakness. Processed with pt her armour she wears to appear "hard" and unapproachable. Discussed reasons she wants to appear hard. Pt thinks vulnerability would make her appear soft. Discussed reasons behind this stance. Pt began a discussion about her childhood, without a father. Will continue this in next session.             Plan: Return again in 2 weeks   Diagnosis: Axis I: PTSD       MACKENZIE,LISBETH S, LCAS  05/26/17

## 2017-06-02 ENCOUNTER — Ambulatory Visit (HOSPITAL_COMMUNITY): Payer: Self-pay | Admitting: Licensed Clinical Social Worker

## 2017-06-06 ENCOUNTER — Encounter: Payer: Medicaid Other | Admitting: Podiatry

## 2017-06-06 ENCOUNTER — Ambulatory Visit: Payer: Medicaid Other

## 2017-06-06 DIAGNOSIS — M21621 Bunionette of right foot: Secondary | ICD-10-CM

## 2017-06-06 NOTE — Progress Notes (Signed)
This encounter was created in error - please disregard.

## 2017-06-07 ENCOUNTER — Encounter: Payer: Self-pay | Admitting: Emergency Medicine

## 2017-06-07 ENCOUNTER — Emergency Department
Admission: EM | Admit: 2017-06-07 | Discharge: 2017-06-07 | Disposition: A | Discharge status: Home / Self Care | Payer: Medicaid Other | Attending: Emergency Medicine | Admitting: Emergency Medicine

## 2017-06-07 ENCOUNTER — Other Ambulatory Visit: Payer: Self-pay

## 2017-06-07 DIAGNOSIS — C50911 Malignant neoplasm of unspecified site of right female breast: Secondary | ICD-10-CM | POA: Insufficient documentation

## 2017-06-07 DIAGNOSIS — M25512 Pain in left shoulder: Secondary | ICD-10-CM | POA: Diagnosis present

## 2017-06-07 DIAGNOSIS — Z79899 Other long term (current) drug therapy: Secondary | ICD-10-CM | POA: Insufficient documentation

## 2017-06-07 DIAGNOSIS — M7552 Bursitis of left shoulder: Secondary | ICD-10-CM | POA: Insufficient documentation

## 2017-06-07 MED ORDER — MELOXICAM 15 MG PO TABS: 15.0000 mg | ORAL_TABLET | Freq: Every day | ORAL | 0 refills | Status: DC

## 2017-06-07 MED ORDER — HYDROCODONE-ACETAMINOPHEN 5-325 MG PO TABS
1.0000 | ORAL_TABLET | Freq: Once | ORAL | Status: AC
  Administered 2017-06-07: 1 via ORAL
  Filled 2017-06-07: qty 1

## 2017-06-07 MED ORDER — PREDNISONE 10 MG PO TABS: 10.0000 mg | ORAL_TABLET | Freq: Every day | ORAL | 0 refills | Status: DC

## 2017-06-07 MED ORDER — KETOROLAC TROMETHAMINE 30 MG/ML IJ SOLN
30.0000 mg | Freq: Once | INTRAMUSCULAR | Status: AC
  Administered 2017-06-07: 30 mg via INTRAMUSCULAR
  Filled 2017-06-07: qty 1

## 2017-06-07 NOTE — ED Triage Notes (Signed)
Pt in with co left shoulder pain for 1 week states unable to lift due to pain, pt guarding arm. States no injury. Hx of the same to right shoulder and it was due to tamoxifen. States had to get cortisone and steroids to right shoulder for relief.

## 2017-06-07 NOTE — ED Provider Notes (Signed)
[facility] Emergency Department Provider Note  ____________________________________________  Time seen: Approximately 9:50 PM  I have reviewed the triage vital signs and the nursing notes.   HISTORY  Chief Complaint Shoulder Pain    HPI Krystal Kelley is a 47 y.o. female who presents the emergency department complaining of left lateral shoulder pain.  Patient reports that she is on tamoxifen for breast cancer.  She has had multiple joint issues with arthralgias and arthritic changes due to the medication.  She reports that she is never had any problems with her left shoulder until approximately 2 weeks ago.  Patient denies any precipitating injury.  She denies any radicular symptoms from her neck or into her arm.  Patient denies any direct trauma to the area.  She has not take any medication for this complaint.  Patient reports for her other joints that she has received both water rehab therapy as well as cortisone injections and steroid tapers.  Patient reports that the steroid taper is the most effective in managing her pain.  Patient denies any headache, visual change, chest pain, shortness of breath, abdominal pain, nausea or vomiting.  No other complaints.  Past Medical History:  Diagnosis Date  . Anxiety   . Breast cancer, right breast ([place])   . Depression   . Peripheral edema    on lasix  . Radiation 10/30/14-12/17/14   right breast 50.4 gray    Patient Active Problem List   Diagnosis Date Noted  . Posttraumatic stress disorder 12/09/2016  . Leg edema, right 10/03/2014  . Genetic testing 07/10/2014  . Breast cancer of lower-inner quadrant of right female breast ([place]) 05/29/2014    Past Surgical History:  Procedure Laterality Date  . BREAST BIOPSY  05/16/14  . BREAST LUMPECTOMY Right 06/18/2014  . bunion surgrery    . CARPAL TUNNEL RELEASE Left 09/21/2016   Procedure: LEFT CARPAL TUNNEL RELEASE;  Surgeon: Daryll Brod, MD;  Location: [place];  Service: Orthopedics;  Laterality: Left;  REG/FAB  . carpel tunnel    . CHOLECYSTECTOMY  2015  . PLANTAR FASCIECTOMY    . PORTACATH PLACEMENT Left 06/18/2014   Procedure: INSERTION PORT-A-CATH;  Surgeon: Excell Seltzer, MD;  Location: [place];  Service: General;  Laterality: Left;    Prior to Admission medications   Medication Sig Start Date End Date Taking? Authorizing Provider  BELVIQ 10 MG TABS Take 10 mg by mouth 2 (two) times daily. 01/05/17   [name], MD  butalbital-acetaminophen-caffeine (FIORICET, ESGIC) [phone] MG tablet Take 1-2 tablets by mouth every 6 (six) hours as needed for headache. 02/14/17 02/14/18  Darel Hong, MD  cycloSPORINE (RESTASIS) 0.05 % ophthalmic emulsion Place 1 drop into both eyes 2 (two) times daily.    [provider]  Eluxadoline (VIBERZI) 100 MG TABS Take 1 tablet (100 mg total) by mouth 2 (two) times daily. 01/18/17   Lucilla Lame, MD  escitalopram (LEXAPRO) 10 MG tablet Take 1 tablet (10 mg total) by mouth daily. 04/25/17 04/25/18  Aundra Dubin, MD  furosemide (LASIX) 40 MG tablet TAKE ONE TABLET BY MOUTH EVERY DAY AS NEEDED 05/09/17   Nicholas Lose, MD  hydrOXYzine (VISTARIL) 25 MG capsule Take 1 capsule (25 mg total) by mouth at bedtime as needed (sleep). 02/09/17   Eksir, Richard Miu, MD  meloxicam (MOBIC) 15 MG tablet Take 1 tablet (15 mg total) by mouth daily. 06/07/17   Trichelle Lehan, Charline Bills, PA-C  oxyCODONE-acetaminophen (PERCOCET) 10-325 MG  tablet Take 1 tablet by mouth every 4 (four) hours as needed for pain. 04/14/17   Hyatt, Max T, DPM  predniSONE (DELTASONE) 10 MG tablet Take 1 tablet (10 mg total) by mouth daily. 06/07/17   Vauda Salvucci, Charline Bills, PA-C  promethazine (PHENERGAN) 25 MG tablet Take 1 tablet (25 mg total) by mouth every 8 (eight) hours as needed. 04/14/17   Hyatt, Max T, DPM  tamoxifen (NOLVADEX) 20 MG tablet TAKE 1 TABLET BY MOUTH DAILY 04/11/17   Nicholas Lose, MD   Vitamin D, Ergocalciferol, (DRISDOL) 50000 units CAPS capsule TK 1 C PO ONCE A WEEK. REPEAT LAB AFTER COMPLETION 08/09/16   [provider]    Allergies Patient has no known allergies.  Family History  Problem Relation Age of Onset  . Kidney cancer Father        kidney  . Prostate cancer Maternal Uncle        deceased 97  . Lung cancer Maternal Grandfather        deceased 58; smoker  . Colon cancer Paternal Grandfather        deceased 79s  . Cancer Other        pat half-sister; ? uterine vs. ovarian ca    Social History Social History   Tobacco Use  . Smoking status: Never Smoker  . Smokeless tobacco: Never Used  Substance Use Topics  . Alcohol use: Yes    Comment: wine- occ   . Drug use: No     Review of Systems  Constitutional: No fever/chills Eyes: No visual changes. Cardiovascular: no chest pain. Respiratory: no cough. No SOB. Gastrointestinal: No abdominal pain.  No nausea, no vomiting.   Musculoskeletal: Positive for left lateral shoulder pain Skin: Negative for rash, abrasions, lacerations, ecchymosis. Neurological: Negative for headaches, focal weakness or numbness. 10-point ROS otherwise negative.  ____________________________________________   PHYSICAL EXAM:  VITAL SIGNS: ED Triage Vitals  Enc Vitals Group     BP 06/07/17 1938 134/85     Pulse Rate 06/07/17 1938 95     Resp 06/07/17 1938 20     Temp 06/07/17 1938 98.6 F (37 C)     Temp Source 06/07/17 1938 Oral     SpO2 06/07/17 1938 97 %     Weight 06/07/17 1941 250 lb (113.4 kg)     Height 06/07/17 1941 5\' 5"  (1.651 m)     Head Circumference --      Peak Flow --      Pain Score 06/07/17 1948 10     Pain Loc --      Pain Edu? --      Excl. in [place]? --      Constitutional: Alert and oriented. Well appearing and in no acute distress. Eyes: Conjunctivae are normal. PERRL. EOMI. Head: Atraumatic. Neck: No stridor.  No cervical spine tenderness to palpation.  Cardiovascular:  Normal rate, regular rhythm. Normal S1 and S2.  Good peripheral circulation. Respiratory: Normal respiratory effort without tachypnea or retractions. Lungs CTAB. Good air entry to the bases with no decreased or absent breath sounds. Musculoskeletal:  No gross deformities appreciated.  Knee range of motion left shoulder due to pain.  Patient is tender to palpation over the lateral aspect of the shoulder just inferior to the olecranon process.  No palpable abnormality.  No other tenderness to palpation.  Special tests are negative.  Radial pulse intact distally.  Sensation intact distally. Neurologic:  Normal speech and language. No gross focal neurologic deficits are appreciated.  Skin:  Skin is warm, dry and intact. No rash noted. Psychiatric: Mood and affect are normal. Speech and behavior are normal. Patient exhibits appropriate insight and judgement.   ____________________________________________   LABS (all labs ordered are listed, but only abnormal results are displayed)  Labs Reviewed - No data to display ____________________________________________  EKG   ____________________________________________  RADIOLOGY   No results found.  ____________________________________________    PROCEDURES  Procedure(s) performed:    Procedures    Medications  ketorolac (TORADOL) 30 MG/ML injection 30 mg (30 mg Intramuscular Given 06/07/17 2220)  HYDROcodone-acetaminophen (NORCO/VICODIN) 5-325 MG per tablet 1 tablet (1 tablet Oral Given 06/07/17 2220)     ____________________________________________   INITIAL IMPRESSION / ASSESSMENT AND PLAN / ED COURSE  Pertinent labs & imaging results that were available during my care of the patient were reviewed by me and considered in my medical decision making (see chart for details).  Review of the [place] CSRS was performed in accordance of the [place] prior to dispensing any controlled drugs.     Patient's diagnosis is consistent with  bursitis of the left shoulder.  Patient is on tamoxifen and has recurring joint issues from side effects of this medication.  Patient is never had effects in this joint space.  Examination is consistent with bursitis.  Differential included impingement syndrome, cervical radiculopathy, rotator cuff tear, biceps tear.  Exam is reassuring.  At this time, no indication for workup.. Patient will be discharged home with prescriptions for short prednisone taper as well as a daily anti-inflammatory medicine.. Patient is to follow up with orthopedics as needed or otherwise directed. Patient is given ED precautions to return to the ED for any worsening or new symptoms.     ____________________________________________  FINAL CLINICAL IMPRESSION(S) / ED DIAGNOSES  Final diagnoses:  Bursitis of left shoulder      NEW MEDICATIONS STARTED DURING THIS VISIT:  ED Discharge Orders        Ordered    predniSONE (DELTASONE) 10 MG tablet  Daily    Comments:  Take 6 pills x 2 days, 5 pills x 2 days, 4 pills x 2 days, 3 pills x 2 days, 2 pills x 2 days, and 1 pill x 2 days   06/07/17 2233    meloxicam (MOBIC) 15 MG tablet  Daily     06/07/17 2233          This chart was dictated using voice recognition software/Dragon. Despite best efforts to proofread, errors can occur which can change the meaning. Any change was purely unintentional.    Darletta Moll, PA-C 06/07/17 2239    Schuyler Amor, MD 06/07/17 [phone]

## 2017-06-08 ENCOUNTER — Encounter: Payer: Self-pay | Admitting: Podiatry

## 2017-06-08 ENCOUNTER — Ambulatory Visit (INDEPENDENT_AMBULATORY_CARE_PROVIDER_SITE_OTHER): Payer: Medicaid Other | Admitting: Podiatry

## 2017-06-08 ENCOUNTER — Ambulatory Visit (INDEPENDENT_AMBULATORY_CARE_PROVIDER_SITE_OTHER): Payer: Medicaid Other

## 2017-06-08 DIAGNOSIS — M21621 Bunionette of right foot: Secondary | ICD-10-CM

## 2017-06-08 DIAGNOSIS — Z9889 Other specified postprocedural states: Secondary | ICD-10-CM

## 2017-06-08 DIAGNOSIS — T847XXD Infection and inflammatory reaction due to other internal orthopedic prosthetic devices, implants and grafts, subsequent encounter: Secondary | ICD-10-CM

## 2017-06-08 DIAGNOSIS — M898X9 Other specified disorders of bone, unspecified site: Secondary | ICD-10-CM

## 2017-06-08 NOTE — Progress Notes (Signed)
She presents today for follow-up of her fifth metatarsal osteotomy date of surgery 04/15/2017.  States that she is doing very well.  Objective: Vital signs are stable she is alert and oriented x3 there is no erythematous mild edema no cellulitis drainage or odor.  Incision site is gone on to heal uneventfully.  Radiographs demonstrate a well-healing fifth metatarsal osteotomy with internal fixation in good position.  Assessment: Well-healing surgical foot right.  Plan: Follow-up with me in 1 month for another set of x-rays allow her to get back into a tennis shoe no dress shoes.

## 2017-06-16 ENCOUNTER — Encounter (HOSPITAL_COMMUNITY): Payer: Self-pay | Admitting: Licensed Clinical Social Worker

## 2017-06-16 ENCOUNTER — Ambulatory Visit (INDEPENDENT_AMBULATORY_CARE_PROVIDER_SITE_OTHER): Payer: Medicaid Other | Admitting: Licensed Clinical Social Worker

## 2017-06-16 DIAGNOSIS — F4310 Post-traumatic stress disorder, unspecified: Secondary | ICD-10-CM | POA: Diagnosis not present

## 2017-06-16 NOTE — Progress Notes (Signed)
   THERAPIST PROGRESS NOTE  Session Time: 54:49EE-10:07HQ  Participation Level: Active  Behavioral Response: Alert/Casual/ Tired  Type of Therapy: Individual Therapy  Treatment Goals addressed: Depressive symptoms  Interventions:  DBT/MI  Summary: ALOMA BOCH is a 47 y.o. female who presents with PTSD.   Suicidal/Homicidal: Nowithout intent/plan  Therapist ResponseAllyson Sabal met with clinician for an individual session. She discussed her psychiatric symptoms and current life events. 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 Asked open ended questions about her decision. Used empathic reflection. Pt has missed or no showed several appointments, so spoke with pt about her "buy in" for therapy. Pt struggles with seeing so many drs. Spoke with her about the importance of keeping appointments. If she misses another appt she will be d/c and referred out. Pt was upset at this decision. Processed her anger and frustration. Pt reports her daughter is pulling out her hair and has been doing it for 4-5 years. Suggested to pt to make an appt here with child psychiatrist and child therapist. Pt was in agreement with the suggestion.    Plan: Return again in 2 weeks   Diagnosis: Axis I: PTSD       Synthia Fairbank S, LCAS  06/16/17

## 2017-06-21 ENCOUNTER — Ambulatory Visit (INDEPENDENT_AMBULATORY_CARE_PROVIDER_SITE_OTHER): Payer: Medicaid Other | Admitting: Psychiatry

## 2017-06-21 ENCOUNTER — Encounter (HOSPITAL_COMMUNITY): Payer: Self-pay | Admitting: Psychiatry

## 2017-06-21 DIAGNOSIS — F4312 Post-traumatic stress disorder, chronic: Secondary | ICD-10-CM

## 2017-06-21 MED ORDER — ESCITALOPRAM OXALATE 10 MG PO TABS: 10.0000 mg | ORAL_TABLET | Freq: Every day | ORAL | 1 refills | Status: DC

## 2017-06-21 NOTE — Progress Notes (Signed)
[facility] MD/PA/NP OP Progress Note  06/21/2017 12:29 PM Krystal Kelley  MRN:  [id number]  Chief Complaint: med management  HPI: Krystal Kelley presents for med management.  Spent time discussing some of her recent ongoing stressors, participation in individual therapy, and her response to Lexapro.  No side effects to the medication.  No acute safety issues.  She continues to rely on feedback from others to be able to understand her internal experience.  Visit Diagnosis:    ICD-10-CM   1. Chronic post-traumatic stress disorder (PTSD) F43.12 escitalopram (LEXAPRO) 10 MG tablet   Past Psychiatric History: See intake H&P for full details. Reviewed, with no updates at this time.   Past Medical History:  Past Medical History:  Diagnosis Date  . Anxiety   . Breast cancer, right breast ([place])   . Depression   . Peripheral edema    on lasix  . Radiation 10/30/14-12/17/14   right breast 50.4 gray    Past Surgical History:  Procedure Laterality Date  . BREAST BIOPSY  05/16/14  . BREAST LUMPECTOMY Right 06/18/2014  . bunion surgrery    . CARPAL TUNNEL RELEASE Left 09/21/2016   Procedure: LEFT CARPAL TUNNEL RELEASE;  Surgeon: Daryll Brod, MD;  Location: [place];  Service: Orthopedics;  Laterality: Left;  REG/FAB  . carpel tunnel    . CHOLECYSTECTOMY  2015  . PLANTAR FASCIECTOMY    . PORTACATH PLACEMENT Left 06/18/2014   Procedure: INSERTION PORT-A-CATH;  Surgeon: Excell Seltzer, MD;  Location: [place];  Service: General;  Laterality: Left;    Family Psychiatric History: See intake H&P for full details. Reviewed, with no updates at this time.   Family History:  Family History  Problem Relation Age of Onset  . Kidney cancer Father        kidney  . Prostate cancer Maternal Uncle        deceased 84  . Lung cancer Maternal Grandfather        deceased 65; smoker  . Colon cancer Paternal Grandfather        deceased 65s  . Cancer Other        pat  half-sister; ? uterine vs. ovarian ca    Social History:  Social History   Socioeconomic History  . Marital status: Divorced    Spouse name: Not on file  . Number of children: 0  . Years of education: Not on file  . Highest education level: Not on file  Social Needs  . Financial resource strain: Not on file  . Food insecurity - worry: Not on file  . Food insecurity - inability: Not on file  . Transportation needs - medical: Not on file  . Transportation needs - non-medical: Not on file  Occupational History  . Occupation: accounts receivable at [employer]  . Smoking status: Never Smoker  . Smokeless tobacco: Never Used  Substance and Sexual Activity  . Alcohol use: Yes    Comment: wine- occ   . Drug use: No  . Sexual activity: Not Currently    Birth control/protection: None    Comment: 1st intercourse- 16, partners- 55,   Other Topics Concern  . Not on file  Social History Narrative  . Not on file    Allergies: No Known Allergies  Metabolic Disorder Labs: No results found for: HGBA1C, MPG No results found for: PROLACTIN Lab Results  Component Value Date   CHOL 198 01/05/2017   TRIG 83 01/05/2017  HDL 104 01/05/2017   CHOLHDL 1.9 01/05/2017   Lab Results  Component Value Date   TSH 1.31 01/05/2017    Therapeutic Level Labs: No results found for: LITHIUM No results found for: VALPROATE No components found for:  CBMZ  Current Medications: Current Outpatient Medications  Medication Sig Dispense Refill  . BELVIQ 10 MG TABS Take 10 mg by mouth 2 (two) times daily. 60 tablet 5  . butalbital-acetaminophen-caffeine (FIORICET, ESGIC) 50-325-40 MG tablet Take 1-2 tablets by mouth every 6 (six) hours as needed for headache. 20 tablet 0  . cycloSPORINE (RESTASIS) 0.05 % ophthalmic emulsion Place 1 drop into both eyes 2 (two) times daily.    . Eluxadoline (VIBERZI) 100 MG TABS Take 1 tablet (100 mg total) by mouth 2 (two) times daily. 60 tablet 5  .  escitalopram (LEXAPRO) 10 MG tablet Take 1 tablet (10 mg total) by mouth daily. 90 tablet 1  . furosemide (LASIX) 40 MG tablet TAKE ONE TABLET BY MOUTH EVERY DAY AS NEEDED 30 tablet 3  . hydrOXYzine (VISTARIL) 25 MG capsule Take 1 capsule (25 mg total) by mouth at bedtime as needed (sleep). 30 capsule 0  . meloxicam (MOBIC) 15 MG tablet Take 1 tablet (15 mg total) by mouth daily. 30 tablet 0  . oxyCODONE-acetaminophen (PERCOCET) 10-325 MG tablet Take 1 tablet by mouth every 4 (four) hours as needed for pain. 30 tablet 0  . predniSONE (DELTASONE) 10 MG tablet Take 1 tablet (10 mg total) by mouth daily. 42 tablet 0  . promethazine (PHENERGAN) 25 MG tablet Take 1 tablet (25 mg total) by mouth every 8 (eight) hours as needed. 20 tablet 0  . tamoxifen (NOLVADEX) 20 MG tablet TAKE 1 TABLET BY MOUTH DAILY 90 tablet 0  . Vitamin D, Ergocalciferol, (DRISDOL) 50000 units CAPS capsule TK 1 C PO ONCE A WEEK. REPEAT LAB AFTER COMPLETION  0   No current facility-administered medications for this visit.      Musculoskeletal: Strength & Muscle Tone: within normal limits Gait & Station: normal Patient leans: N/A  Psychiatric Specialty Exam: ROS  There were no vitals taken for this visit.There is no height or weight on file to calculate BMI.  General Appearance: Casual and Fairly Groomed  Kelley Contact:  Fair  Speech:  Clear and Coherent and Normal Rate  Volume:  Normal  Mood:  Euthymic  Affect:  Appropriate and Congruent  Thought Process:  Goal Directed and Descriptions of Associations: Intact  Orientation:  Full (Time, Place, and Person)  Thought Content: Logical   Suicidal Thoughts:  No  Homicidal Thoughts:  No  Memory:  Immediate;   Fair  Judgement:  Fair  Insight:  Shallow  Psychomotor Activity:  Normal  Concentration:  Concentration: Fair  Recall:  [employer] of Knowledge: Fair  Language: Good  Akathisia:  Negative  Handed:  Right  AIMS (if indicated): not done  Assets:  Communication  Skills Desire for Improvement Financial Resources/Insurance Housing Transportation  ADL's:  Intact  Cognition: WNL  Sleep:  Good   Screenings:   Assessment and Plan:  MALLARY KREGER presents as fairly stable, anxiety and irritability improving.  Continues to have very poor insight into her internal experience and is building insight and ongoing therapy.  No acute safety issues, she wishes to continue Lexapro 10 mg, but I would have a low threshold to increase to 20 mg if the patient is agreeable.  1. Chronic post-traumatic stress disorder (PTSD)     Status  of current problems: gradually improving  Labs Ordered: No orders of the defined types were placed in this encounter.   Labs Reviewed: N/A  Collateral Obtained/Records Reviewed: Therapy notes  Plan:  Lexapro 10 mg daily Return to clinic in 6 months  I spent 20 minutes with the patient in direct face-to-face clinical care.  Greater than 50% of this time was spent in counseling and coordination of care with the patient.    Aundra Dubin, MD 06/21/2017, 12:29 PM

## 2017-06-23 ENCOUNTER — Encounter (HOSPITAL_COMMUNITY): Payer: Self-pay | Admitting: Licensed Clinical Social Worker

## 2017-06-23 ENCOUNTER — Ambulatory Visit (INDEPENDENT_AMBULATORY_CARE_PROVIDER_SITE_OTHER): Payer: Medicaid Other | Admitting: Licensed Clinical Social Worker

## 2017-06-23 DIAGNOSIS — F4312 Post-traumatic stress disorder, chronic: Secondary | ICD-10-CM | POA: Diagnosis not present

## 2017-06-23 NOTE — Progress Notes (Signed)
   THERAPIST PROGRESS NOTE  Session Time: 00:17CB-44:96PR  Participation Level: Active  Behavioral Response: Alert/Casual  Type of Therapy: Individual Therapy  Treatment Goals addressed: Depressive symptoms  Interventions:  DBT/MI/CBT  Summary: DAYANNA PRYCE is a 47 y.o. female who presents with PTSD.   Suicidal/Homicidal: Nowithout intent/plan  Therapist ResponseAllyson Sabal met with clinician for an individual session. She discussed her psychiatric symptoms and current life events. Pt continues fostering baby. Asked open ended questions about the situation and used empathic reflection. Pt still continues with irritation about missed appts. Discussed with pt locus of control, a framework for understanding her perception of the controlling factors. Discussed the difference between internal and external locus of control. Talked with pt about resiliency. Used CBT to assist pt with reframing her thoughts and perception from external events.    Plan: Return again in 2 weeks   Diagnosis: Axis I: PTSD       Zayda Angell S, LCAS  06/23/17

## 2017-06-30 ENCOUNTER — Ambulatory Visit (HOSPITAL_COMMUNITY): Payer: Self-pay | Admitting: Licensed Clinical Social Worker

## 2017-07-06 ENCOUNTER — Ambulatory Visit (INDEPENDENT_AMBULATORY_CARE_PROVIDER_SITE_OTHER): Payer: Medicaid Other | Admitting: Podiatry

## 2017-07-06 ENCOUNTER — Ambulatory Visit (INDEPENDENT_AMBULATORY_CARE_PROVIDER_SITE_OTHER): Payer: Medicaid Other

## 2017-07-06 ENCOUNTER — Encounter: Payer: Self-pay | Admitting: Podiatry

## 2017-07-06 DIAGNOSIS — T847XXD Infection and inflammatory reaction due to other internal orthopedic prosthetic devices, implants and grafts, subsequent encounter: Secondary | ICD-10-CM

## 2017-07-06 DIAGNOSIS — Z9889 Other specified postprocedural states: Secondary | ICD-10-CM

## 2017-07-06 DIAGNOSIS — M21621 Bunionette of right foot: Secondary | ICD-10-CM | POA: Diagnosis not present

## 2017-07-06 DIAGNOSIS — M898X9 Other specified disorders of bone, unspecified site: Secondary | ICD-10-CM

## 2017-07-06 NOTE — Progress Notes (Signed)
She presents today for postop visit regarding her right foot.  Date of surgery April 15, 2017 fifth metatarsal osteotomy right and removal of internal fixation fifth left all appears to be healing well she says.  She states that she is swollen.  Objective: Vital signs are stable she is alert oriented x3 mild edema bilaterally.  Pulses remain palpable.  Neurologic sensorium is intact no pain on palpation or range of motion of the fifth digit of the metatarsal phalangeal joint of the right foot.  Radiographs confirm well-healed osteotomy with screw fixation.  Left foot is gone on to heal uneventfully.  Assessment: Well-healing surgical foot bilateral.  Plan: I will follow-up with her on an as-needed basis.

## 2017-07-07 ENCOUNTER — Encounter (HOSPITAL_COMMUNITY): Payer: Self-pay | Admitting: Licensed Clinical Social Worker

## 2017-07-07 ENCOUNTER — Other Ambulatory Visit: Payer: Self-pay | Admitting: Hematology and Oncology

## 2017-07-07 ENCOUNTER — Ambulatory Visit (INDEPENDENT_AMBULATORY_CARE_PROVIDER_SITE_OTHER): Payer: Medicaid Other | Admitting: Licensed Clinical Social Worker

## 2017-07-07 DIAGNOSIS — F4312 Post-traumatic stress disorder, chronic: Secondary | ICD-10-CM

## 2017-07-07 DIAGNOSIS — C50311 Malignant neoplasm of lower-inner quadrant of right female breast: Secondary | ICD-10-CM

## 2017-07-07 NOTE — Progress Notes (Signed)
   THERAPIST PROGRESS NOTE  Session Time: 27:25DG-64:40HK  Participation Level: Active  Behavioral Response: Alert/Casual  Type of Therapy: Individual Therapy  Treatment Goals addressed: Depressive symptoms  Interventions:  DBT/MI/CBT  Summary: Krystal Kelley is a 47 y.o. female who presents with PTSD.   Suicidal/Homicidal: Nowithout intent/plan  Therapist ResponseAllyson Sabal met with clinician for an individual session. She discussed her psychiatric symptoms and current life events. Pt continues fostering baby. Asked open ended questions about the situation and used empathic reflection. Pt reports she is having some new symptoms: tired, unmotivated, no energy. Discussed with her symptoms of depression. She says others in her family are noticing she is always in her room. Pt continues to have pain from her Tomoxifin medication. Pt has disability hearing 4/23. Pt is concerned about raising a teenager. Discussed parenting skills with pt. Asked open ended questions.     Plan: Return again in 2 weeks   Diagnosis: Axis I: PTSD       MACKENZIE,LISBETH S, LCAS  07/07/17

## 2017-07-13 DIAGNOSIS — R52 Pain, unspecified: Secondary | ICD-10-CM | POA: Insufficient documentation

## 2017-07-14 ENCOUNTER — Ambulatory Visit (INDEPENDENT_AMBULATORY_CARE_PROVIDER_SITE_OTHER): Payer: Medicaid Other | Admitting: Licensed Clinical Social Worker

## 2017-07-14 ENCOUNTER — Other Ambulatory Visit: Payer: Self-pay

## 2017-07-14 ENCOUNTER — Encounter (HOSPITAL_COMMUNITY): Payer: Self-pay

## 2017-07-14 ENCOUNTER — Emergency Department (HOSPITAL_BASED_OUTPATIENT_CLINIC_OR_DEPARTMENT_OTHER)
Admit: 2017-07-14 | Discharge: 2017-07-14 | Disposition: A | Discharge status: Home / Self Care | Payer: Medicaid Other | Attending: Emergency Medicine | Admitting: Emergency Medicine

## 2017-07-14 ENCOUNTER — Encounter (HOSPITAL_COMMUNITY): Payer: Self-pay | Admitting: Licensed Clinical Social Worker

## 2017-07-14 ENCOUNTER — Emergency Department (HOSPITAL_COMMUNITY)
Admission: EM | Admit: 2017-07-14 | Discharge: 2017-07-14 | Disposition: A | Discharge status: Home / Self Care | Payer: Medicaid Other | Attending: Emergency Medicine | Admitting: Emergency Medicine

## 2017-07-14 ENCOUNTER — Telehealth (HOSPITAL_COMMUNITY): Payer: Self-pay

## 2017-07-14 DIAGNOSIS — I10 Essential (primary) hypertension: Secondary | ICD-10-CM | POA: Insufficient documentation

## 2017-07-14 DIAGNOSIS — F4310 Post-traumatic stress disorder, unspecified: Secondary | ICD-10-CM | POA: Diagnosis not present

## 2017-07-14 DIAGNOSIS — Z79899 Other long term (current) drug therapy: Secondary | ICD-10-CM | POA: Insufficient documentation

## 2017-07-14 DIAGNOSIS — R600 Localized edema: Secondary | ICD-10-CM | POA: Diagnosis present

## 2017-07-14 DIAGNOSIS — R609 Edema, unspecified: Secondary | ICD-10-CM | POA: Diagnosis not present

## 2017-07-14 DIAGNOSIS — Z853 Personal history of malignant neoplasm of breast: Secondary | ICD-10-CM | POA: Insufficient documentation

## 2017-07-14 LAB — CBC
HCT: 39.5 % (ref 36.0–46.0)
Hemoglobin: 12.9 g/dL (ref 12.0–15.0)
MCH: 31.2 pg (ref 26.0–34.0)
MCHC: 32.7 g/dL (ref 30.0–36.0)
MCV: 95.6 fL (ref 78.0–100.0)
Platelets: 342 10*3/uL (ref 150–400)
RBC: 4.13 MIL/uL (ref 3.87–5.11)
RDW: 13.8 % (ref 11.5–15.5)
WBC: 9.6 K/uL (ref 4.0–10.5)

## 2017-07-14 LAB — BASIC METABOLIC PANEL WITH GFR
Anion gap: 6 (ref 5–15)
CO2: 30 mmol/L (ref 22–32)
Chloride: 105 mmol/L (ref 101–111)
GFR calc Af Amer: >60 mL/min (ref >60)
Potassium: 3.5 mmol/L (ref 3.5–5.1)

## 2017-07-14 LAB — BASIC METABOLIC PANEL
BUN: 13 mg/dL (ref 6–20)
Calcium: 8.9 mg/dL (ref 8.9–10.3)
Creatinine, Ser: 0.77 mg/dL (ref 0.44–1.00)
GFR calc non Af Amer: >60 mL/min (ref >60)
Glucose, Bld: 91 mg/dL (ref 65–99)
Sodium: 141 mmol/L (ref 135–145)

## 2017-07-14 MED ORDER — FUROSEMIDE 20 MG PO TABS: 60.0000 mg | ORAL_TABLET | Freq: Every day | ORAL | 0 refills | Status: DC

## 2017-07-14 NOTE — Progress Notes (Signed)
   THERAPIST PROGRESS NOTE  Session Time: 47:42VZ-56:38VF  Participation Level: Active  Behavioral Response: Alert/Casual  Type of Therapy: Individual Therapy  Treatment Goals addressed: Depressive symptoms  Interventions:  DBT/MI/CBT  Summary: Krystal Kelley is a 47 y.o. female who presents with PTSD.   Suicidal/Homicidal: Nowithout intent/plan  Therapist ResponseAllyson Sabal met with clinician for an individual session. She discussed her psychiatric symptoms and current life events.Pt continues fostering baby. Asked open ended questions about the situation and used empathic reflection. Pt looked extremely tired when she presented for the appt. Discussed her sleep hygiene with her. Pt continues to look for a job. Processed with pt her readiness to have a full time job with all the stressors in her life currently. Pt reported feels that she can handle it. Pt reported her ankles are still swollen. Asked CMA Regan to take bp of pt and show her the ankles of the pt. Pt had an elevated bp and also has a hx of a blood clot. CMA suggested pt go to the ED because of the elevated bp, hx of blood clot and continued welling of ankles. Discussed with pt the importance of self care so that she can continue with her responsibilities of taking care of everyone else. Pt stated she would go to the ED after leaving this appt.         Plan: Return again in 2 weeks   Diagnosis: Axis I: PTSD       Trajon Rosete S, LCAS  07/14/17

## 2017-07-14 NOTE — ED Notes (Signed)
Bed: WA27 Expected date:  Expected time:  Means of arrival:  Comments: HALL C

## 2017-07-14 NOTE — Progress Notes (Signed)
Bilateral lower extremity venous duplex has been completed. Negative for DVT. There is evidence of an anechoic area on the proximal, medial left calf. Results were given to Dr. Venora Maples.  07/14/17 2:57 PM Krystal Kelley RVT

## 2017-07-14 NOTE — Telephone Encounter (Signed)
Patient came in this morning to see her therapist Beth. She has been having headaches and swelling of the ankles. I took her blood pressure and it was 158/119 and I recommended that she go to the urgent care or ED. Patient states that she will go, but she is not happy about it.

## 2017-07-14 NOTE — ED Triage Notes (Signed)
Patient reports that she had an appointment and BP was elevated and patient continues to have swelling in her legs despite taking Lasix 40 mg daily. Patient was sent by physician.

## 2017-07-14 NOTE — ED Provider Notes (Signed)
[facility] DEPT Provider Note   CSN: [id number] Arrival date & time: 07/14/17  1209     History   Chief Complaint Chief Complaint  Patient presents with  . Hypertension  . Leg Swelling    HPI Krystal Kelley is a 47 y.o. female.  Krystal Kelley is a 47 y.o. Female with hx of prior breast cancer, hypertension, peripheral edema, plantar fasciitis, recent bunion surgery, anxiety and depression, who presents to ED for evaluation of bilateral lower leg swelling and elevated BP.  Pt reports for the last 3 weeks she has had worsening swelling in bilateral lower extremities, primarily at the ankle. Pt reports with worsening swelling legs feel tight and tender.  Patient denies any overlying redness or warmth.   pt reports she has been on 40 mg lasix since 2016 after she was diagnosed with superficial blood clot in right ankle, was not put on blood thinners, but reports swelling is not been improving despite this.  Patient also reports she elevates her feet at night and is not standing all day at work.  Patient denies any associated chest pain or shortness of breath.  Reports she has not had increased difficulty sleeping, no orthopnea or paroxysmal dyspnea. She reports her blood pressure was slightly elevated at her behavioral health appointment today at 153/112, her psychiatrist expressed concern over this and recommend she come to the ED.  Patient reports she used to take HCTZ, but has not been on that since Lasix.  Blood pressure improved here, 144/94 in triage.  Patient is not having any headaches, vision changes, extremity weakness or numbness, chest pain or shortness of breath.     Past Medical History:  Diagnosis Date  . Anxiety   . Breast cancer, right breast ([place])   . Depression   . Peripheral edema    on lasix  . Radiation 10/30/14-12/17/14   right breast 50.4 gray    Patient Active Problem List   Diagnosis Date Noted  . Posttraumatic stress  disorder 12/09/2016  . Leg edema, right 10/03/2014  . Genetic testing 07/10/2014  . Breast cancer of lower-inner quadrant of right female breast ([place]) 05/29/2014    Past Surgical History:  Procedure Laterality Date  . BREAST BIOPSY  05/16/14  . BREAST LUMPECTOMY Right 06/18/2014  . bunion surgrery    . CARPAL TUNNEL RELEASE Left 09/21/2016   Procedure: LEFT CARPAL TUNNEL RELEASE;  Surgeon: Daryll Brod, MD;  Location: [facility];  Service: Orthopedics;  Laterality: Left;  REG/FAB  . carpel tunnel    . CHOLECYSTECTOMY  2015  . PLANTAR FASCIECTOMY    . PORTACATH PLACEMENT Left 06/18/2014   Procedure: INSERTION PORT-A-CATH;  Surgeon: Excell Seltzer, MD;  Location: [facility];  Service: General;  Laterality: Left;     OB History    Gravida  0   Para  0   Term  0   Preterm  0   AB  0   Living  0     SAB  0   TAB  0   Ectopic  0   Multiple  0   Live Births  0            Home Medications    Prior to Admission medications   Medication Sig Start Date End Date Taking? Authorizing Provider  cycloSPORINE (RESTASIS) 0.05 % ophthalmic emulsion Place 1 drop into both eyes 2 (two) times daily.   Yes [provider]  Eluxadoline (  VIBERZI) 100 MG TABS Take 1 tablet (100 mg total) by mouth 2 (two) times daily. 01/18/17  Yes Lucilla Lame, MD  escitalopram (LEXAPRO) 10 MG tablet Take 1 tablet (10 mg total) by mouth daily. 06/21/17 06/21/18 Yes Eksir, Richard Miu, MD  furosemide (LASIX) 40 MG tablet TAKE ONE TABLET BY MOUTH EVERY DAY AS NEEDED 05/09/17  Yes Nicholas Lose, MD  meloxicam (MOBIC) 15 MG tablet Take 1 tablet (15 mg total) by mouth daily. 06/07/17  Yes Cuthriell, Charline Bills, PA-C  tamoxifen (NOLVADEX) 20 MG tablet TAKE 1 TABLET BY MOUTH DAILY 07/07/17  Yes Nicholas Lose, MD  BELVIQ 10 MG TABS Take 10 mg by mouth 2 (two) times daily. Patient not taking: Reported on 07/14/2017 01/05/17   [name], MD    butalbital-acetaminophen-caffeine (FIORICET, ESGIC) [phone] MG tablet Take 1-2 tablets by mouth every 6 (six) hours as needed for headache. Patient not taking: Reported on 07/14/2017 02/14/17 02/14/18  Darel Hong, MD  hydrOXYzine (VISTARIL) 25 MG capsule Take 1 capsule (25 mg total) by mouth at bedtime as needed (sleep). Patient not taking: Reported on 07/14/2017 02/09/17   Aundra Dubin, MD  oxyCODONE-acetaminophen (PERCOCET) 10-325 MG tablet Take 1 tablet by mouth every 4 (four) hours as needed for pain. Patient not taking: Reported on 07/14/2017 04/14/17   Tyson Dense T, DPM  predniSONE (DELTASONE) 10 MG tablet Take 1 tablet (10 mg total) by mouth daily. Patient not taking: Reported on 07/14/2017 06/07/17   Cuthriell, Charline Bills, PA-C  promethazine (PHENERGAN) 25 MG tablet Take 1 tablet (25 mg total) by mouth every 8 (eight) hours as needed. Patient not taking: Reported on 07/14/2017 04/14/17   Garrel Ridgel, DPM    Family History Family History  Problem Relation Age of Onset  . Kidney cancer Father        kidney  . Prostate cancer Maternal Uncle        deceased 9  . Lung cancer Maternal Grandfather        deceased 59; smoker  . Colon cancer Paternal Grandfather        deceased 27s  . Cancer Other        pat half-sister; ? uterine vs. ovarian ca    Social History Social History   Tobacco Use  . Smoking status: Never Smoker  . Smokeless tobacco: Never Used  Substance Use Topics  . Alcohol use: Yes    Comment: wine- occ   . Drug use: No     Allergies   Patient has no known allergies.   Review of Systems Review of Systems  Constitutional: Negative for chills and fever.  HENT: Negative for congestion, rhinorrhea and sore throat.   Eyes: Negative for visual disturbance.  Respiratory: Negative for cough, chest tightness, shortness of breath and wheezing.   Cardiovascular: Positive for leg swelling. Negative for chest pain and palpitations.  Gastrointestinal:  Negative for abdominal pain, diarrhea, nausea and vomiting.  Genitourinary: Negative for dysuria.  Musculoskeletal: Negative for back pain and myalgias.  Skin: Negative for color change and rash.  Neurological: Negative for dizziness, weakness, light-headedness, numbness and headaches.  All other systems reviewed and are negative.    Physical Exam Updated Vital Signs BP (!) 144/94 (BP Location: Left Arm)   Pulse 76   Temp 98.6 F (37 C) (Oral)   Resp 20   Ht 5\' 5"  (1.651 m)   Wt 119.5 kg (263 lb 8 oz)   SpO2 100%   BMI 43.85 kg/m  Physical Exam  Constitutional: She is oriented to person, place, and time. She appears well-developed and well-nourished. No distress.  HENT:  Head: Normocephalic and atraumatic.  Eyes: Right eye exhibits no discharge. Left eye exhibits no discharge.  Neck: Neck supple.  Cardiovascular: Normal rate, regular rhythm, normal heart sounds and intact distal pulses.  Pulmonary/Chest: Effort normal and breath sounds normal. No stridor. No respiratory distress. She has no wheezes. She has no rales.  Respirations equal and unlabored, patient able to speak in full sentences, lungs clear to auscultation bilaterally  Abdominal: Soft. Bowel sounds are normal. She exhibits no distension and no mass. There is no tenderness. There is no guarding.  Musculoskeletal: She exhibits edema and tenderness. She exhibits no deformity.  3+ pitting edema over bilateral ankles, worse over medial aspect over of ankle, tenderness to palpation over swelling, no erythema or warmth, no palpable cord, 2+ DP and TP pulses, sensation intact, 5/5/ dorsi and plantar flexion  Neurological: She is alert and oriented to person, place, and time. Coordination normal.  Skin: Skin is warm and dry. Capillary refill takes less than 2 seconds. She is not diaphoretic.  Psychiatric: She has a normal mood and affect. Her behavior is normal.  Nursing note and vitals reviewed.    ED Treatments /  Results  Labs (all labs ordered are listed, but only abnormal results are displayed) Labs Reviewed  CBC  BASIC METABOLIC PANEL    EKG None  Radiology No results found.  Procedures Procedures (including critical care time)  Medications Ordered in ED Medications - No data to display   Initial Impression / Assessment and Plan / ED Course  I have reviewed the triage vital signs and the nursing notes.  Pertinent labs & imaging results that were available during my care of the patient were reviewed by me and considered in my medical decision making (see chart for details).  Patient presents to the ED for evaluation of bilateral leg swelling worsening over the past few weeks despite compliance with daily 40 mg of Lasix.  No associated chest pain or shortness of breath, no orthopnea or PND.  On exam patient mildly hypertensive but this is improved from when she was in the clinic earlier today.  Vitals otherwise normal and patient is in no acute distress.  3+ pitting edema bilateral ankles primarily over the medial aspect, no overlying erythema or warmth, no palpable cord.  Will check basic labs to ensure renal function is preserved with Lasix use, and will get bilateral DVT studies of the lower extremities.  Vascular ultrasound negative for DVT in bilateral lower extremities.  Labs reviewed, normal kidney function, no electrolyte derangements, no leukocytosis and normal hemoglobin.  Discussed these results with the patient.  At this time feel she is stable for discharge home will increase her Lasix dose to 60 mg/day also encourage patient to use compression stockings and follow-up with her primary care doctor in the next week for recheck.  Strict return precautions discussed.  Patient expresses understanding and is in agreement with plan.  Pt's blood pressure was elevated today, pt has hx of HTN, pt is not exhibiting any symptoms to suggest hypertensive urgency or emergency today, will have pt  follow up with their PCP in 1 week for blood pressure check. Discussed long term consequences of untreated hypertension with the patient.   Final Clinical Impressions(s) / ED Diagnoses   Final diagnoses:  Bilateral lower extremity edema  Hypertension, unspecified type    ED Discharge Orders  Ordered    furosemide (LASIX) 20 MG tablet  Daily     07/14/17 1626       Jacqlyn Larsen, [place] 07/14/17 1628    Isla Pence, MD 07/15/17 [phone]

## 2017-07-14 NOTE — Telephone Encounter (Signed)
Thank you, I agree that is probably the most prudent next step

## 2017-07-14 NOTE — Discharge Instructions (Signed)
Please begin taking 60 mg of Lasix daily, use compression stockings and elevate your feet as much as possible.  It is extremely important you follow-up with your primary care doctor in the next week for recheck.  Please return to the emergency department if you have worsening swelling especially if it is worse in one leg compared to the other, chest pain, shortness of breath or any other new or concerning symptoms.

## 2017-07-21 ENCOUNTER — Ambulatory Visit (HOSPITAL_COMMUNITY): Payer: Self-pay | Admitting: Licensed Clinical Social Worker

## 2017-07-28 ENCOUNTER — Ambulatory Visit (INDEPENDENT_AMBULATORY_CARE_PROVIDER_SITE_OTHER): Payer: Medicaid Other | Admitting: Licensed Clinical Social Worker

## 2017-07-28 ENCOUNTER — Telehealth: Payer: Self-pay

## 2017-07-28 ENCOUNTER — Other Ambulatory Visit: Payer: Self-pay

## 2017-07-28 ENCOUNTER — Encounter (HOSPITAL_COMMUNITY): Payer: Self-pay | Admitting: Licensed Clinical Social Worker

## 2017-07-28 DIAGNOSIS — F4323 Adjustment disorder with mixed anxiety and depressed mood: Secondary | ICD-10-CM

## 2017-07-28 DIAGNOSIS — F4310 Post-traumatic stress disorder, unspecified: Secondary | ICD-10-CM

## 2017-07-28 NOTE — Progress Notes (Signed)
   THERAPIST PROGRESS NOTE  Session Time: 03:79KC-46:19UV  Participation Level: Active  Behavioral Response: Alert/Casual  Type of Therapy: Individual Therapy  Treatment Goals addressed: Depressive symptoms  Interventions:  DBT/MI/CBT  Summary: Krystal Kelley is a 47 y.o. female who presents with PTSD.   Suicidal/Homicidal: Nowithout intent/plan  Therapist ResponseAllyson Sabal met with clinician for an individual session. She discussed her psychiatric symptoms and current life events. Pt was upset because she has been unable to reach someone at the front desk when she calls to cancel an appt. Gave pt my desk phone # so she can call me directly. Pt reports she has her disability hearing on 4/22. She needs notes from [facility], gave her information on how to request records from [facility] Pt reports she has been applying for jobs. Discussed opportunities in the [place] area for pt to apply for. Pt has been given an opportunity to sell [employer]. Discussed this opportunity with her. Pt seemed frustrated that she has been unable to find a "real job." Discussed pt's frustrations and encouraged pt to continue to look for a real job. Pt reports she still has custody (temp) of the baby. She feels less stressed about the responsibility. Her daughter is having some anxiety issues and pt asked for recommendations.   Plan: Return again in 2 weeks   Diagnosis: Axis I: PTSD       Reann Dobias S, LCAS  07/28/17

## 2017-07-28 NOTE — Telephone Encounter (Addendum)
Patient received a Rx for Belviq at her 01/05/2017. It was good for 6 mos. However, she was forced to d/c it in Dec because her Medicaid did not cover it. I confirmed with the pharmacy that she had 2 refills left but the Rx has expired.  She said she has "a discount" now and would like to get another Rx for Belviq so the pharmacy can see if this "discount" will make the Rx affordable for her.

## 2017-07-29 MED ORDER — BELVIQ 10 MG PO TABS: 10.0000 mg | ORAL_TABLET | Freq: Two times a day (BID) | ORAL | 1 refills | Status: DC

## 2017-07-29 NOTE — Telephone Encounter (Signed)
Patient advised. Rx called in.

## 2017-07-29 NOTE — Telephone Encounter (Signed)
Agree with Belviq x 2 months.  Then, will need a visit for weight management before prescribing Belviq for another 6 months as indicated.

## 2017-08-01 ENCOUNTER — Ambulatory Visit: Payer: Medicaid Other | Admitting: Podiatry

## 2017-08-01 ENCOUNTER — Ambulatory Visit: Payer: Medicaid Other

## 2017-08-01 ENCOUNTER — Encounter: Payer: Self-pay | Admitting: Podiatry

## 2017-08-01 DIAGNOSIS — M21621 Bunionette of right foot: Secondary | ICD-10-CM

## 2017-08-01 DIAGNOSIS — M722 Plantar fascial fibromatosis: Secondary | ICD-10-CM | POA: Diagnosis not present

## 2017-08-01 NOTE — Progress Notes (Signed)
She presents today and states that my heels of started to hurt again so I would like to have another set of injections if possible.  Objective: Vital signs are stable she is alert and oriented x3 she has pain on palpation of medial calcaneal tubercles bilateral she also has tenderness on palpation of the posterior tibial tendons of the course beneath the medial malleolus extending to the navicular tuberosity.  Assessment: Chronic intractable plantar fasciitis bilateral.  Posterior tibial tendinitis bilateral.  Plan: Discussed etiology pathology conservative versus surgical therapies.  I injected the bilateral heels today with Kenalog and local anesthetic after sterile Betadine skin prep consisting 20 mg Kenalog 5 mg Marcaine to each individual here heel.  She tolerated procedure well without complications and I will follow-up with her in 1 month to 6 weeks.

## 2017-08-04 ENCOUNTER — Ambulatory Visit (HOSPITAL_COMMUNITY): Payer: Self-pay | Admitting: Licensed Clinical Social Worker

## 2017-08-11 ENCOUNTER — Ambulatory Visit (INDEPENDENT_AMBULATORY_CARE_PROVIDER_SITE_OTHER): Payer: Medicaid Other | Admitting: Licensed Clinical Social Worker

## 2017-08-11 ENCOUNTER — Encounter (HOSPITAL_COMMUNITY): Payer: Self-pay | Admitting: Licensed Clinical Social Worker

## 2017-08-11 DIAGNOSIS — F4310 Post-traumatic stress disorder, unspecified: Secondary | ICD-10-CM

## 2017-08-11 NOTE — Progress Notes (Signed)
   THERAPIST PROGRESS NOTE  Session Time: 43:27MD-47:09KH  Participation Level: Active  Behavioral Response: Alert/Casual  Type of Therapy: Individual Therapy  Treatment Goals addressed: Depressive symptoms  Interventions:  DBT/MI/CBT  Summary: Krystal Kelley is a 47 y.o. female who presents with PTSD.   Suicidal/Homicidal: Nowithout intent/plan  Therapist ResponseAllyson Sabal met with clinician for an individual session. She discussed her psychiatric symptoms and current life events. Pt was tearful upon presentation for her therapy session. One of her "breast cancer surviovor" friends had passed away. Asked open ended questions and used empathic reflection. Pt had her disability hearing Monday and was approved for disability. Asked open ended questions. Pt still has temp custody of a baby. This is added stress but she is doing it to keep the baby out of foster care. Pt shared that people say, "im hard." Discussed what this means to pt. Also discussed with pt is this who she wants to be. Homework for pt: Do I like having the personna of being hard. Suggested she ask others in her support circle what they see in her.  Pt still continues to be interested in selling [employer]. Asked open ended questions. Pt's daughter continues to have anxiety issues and pt is looking at mentors for daughter.Pt continues to use breathing exercises for her anxiety.   Plan: Return again in 2 weeks   Diagnosis: Axis I: PTSD       Naftali Carchi S, LCAS  08/11/17

## 2017-08-17 ENCOUNTER — Ambulatory Visit: Payer: Medicaid Other | Admitting: Podiatry

## 2017-09-07 ENCOUNTER — Ambulatory Visit: Payer: Self-pay | Admitting: Hematology and Oncology

## 2017-09-08 ENCOUNTER — Other Ambulatory Visit: Payer: Self-pay

## 2017-09-08 ENCOUNTER — Inpatient Hospital Stay: Payer: Medicaid Other | Attending: Hematology and Oncology | Admitting: Hematology and Oncology

## 2017-09-08 ENCOUNTER — Inpatient Hospital Stay: Payer: Medicaid Other

## 2017-09-08 DIAGNOSIS — C50311 Malignant neoplasm of lower-inner quadrant of right female breast: Secondary | ICD-10-CM

## 2017-09-08 DIAGNOSIS — Z170 Estrogen receptor positive status [ER+]: Secondary | ICD-10-CM | POA: Diagnosis not present

## 2017-09-08 LAB — CBC WITH DIFFERENTIAL (CANCER CENTER ONLY)
Basophils Absolute: 0 10*3/uL (ref 0.0–0.1)
Basophils Relative: 0 %
Eosinophils Absolute: 0.1 10*3/uL (ref 0.0–0.5)
Eosinophils Relative: 1 %
HCT: 38.9 % (ref 34.8–46.6)
Hemoglobin: 12.8 g/dL (ref 11.6–15.9)
Lymphocytes Relative: 37 %
Lymphs Abs: 3.1 K/uL (ref 0.9–3.3)
MCH: 31.4 pg (ref 25.1–34.0)
MCHC: 32.9 g/dL (ref 31.5–36.0)
MCV: 95.3 fL (ref 79.5–101.0)
Monocytes Absolute: 0.6 10*3/uL (ref 0.1–0.9)
Monocytes Relative: 8 %
Neutro Abs: 4.5 K/uL (ref 1.5–6.5)
Neutrophils Relative %: 54 %
Platelet Count: 275 K/uL (ref 145–400)
RBC: 4.08 MIL/uL (ref 3.70–5.45)
RDW: 13.5 % (ref 11.2–14.5)
WBC Count: 8.3 10*3/uL (ref 3.9–10.3)

## 2017-09-08 LAB — CMP (CANCER CENTER ONLY)
ALT: 30 U/L (ref 0–55)
AST: 30 U/L (ref 5–34)
Albumin: 3.6 g/dL (ref 3.5–5.0)
Alkaline Phosphatase: 53 U/L (ref 40–150)
Anion gap: 6 (ref 3–11)
BUN: 14 mg/dL (ref 7–26)
CO2: 29 mmol/L (ref 22–29)
Calcium: 9 mg/dL (ref 8.4–10.4)
Chloride: 106 mmol/L (ref 98–109)
Creatinine: 0.94 mg/dL (ref 0.60–1.10)
GFR, Est AFR Am: >60 mL/min (ref >60)
GFR, Estimated: >60 mL/min (ref >60)
Glucose, Bld: 90 mg/dL (ref 70–140)
Potassium: 4.1 mmol/L (ref 3.5–5.1)
Sodium: 141 mmol/L (ref 136–145)
Total Bilirubin: 0.3 mg/dL (ref 0.2–1.2)
Total Protein: 7.3 g/dL (ref 6.4–8.3)

## 2017-09-08 MED ORDER — FUROSEMIDE 20 MG PO TABS: 60.0000 mg | ORAL_TABLET | Freq: Every day | ORAL | 2 refills | Status: DC

## 2017-09-08 MED ORDER — TAMOXIFEN CITRATE 20 MG PO TABS: 20.0000 mg | ORAL_TABLET | Freq: Every day | ORAL | 3 refills | Status: DC

## 2017-09-08 NOTE — Assessment & Plan Note (Signed)
Right breast lumpectomy 06/18/2014: Invasive ductal carcinoma 1.3 cm negative for LVI; DCIS 1 mm from margins, 1 sentinel node negative, grade 3 ER 95%, PR 77%, HER-2 amplified ratio 8.25, Ki-67 43% T1 cN0 M0 stage IA  Treatment summary S/P Adjuvant chemo with Abraxane Herceptin started 07/18/2014 completed 10/03/14, Herceptin maintenance completed 07/03/2015 Status post adjuvant radiation completed 12/17/2014 Started tamoxifen 12/26/2014 ---------------------------------------------------------------------------- Current treatment:Tamoxifen 20 mg daily 12/26/2014  Tamoxifen toxicities:  1. Emotional changes especially crying spells and anxiety spells: Much improved Since she is seeing a therapist 2. Hot flashes. 3. Diffuse myalgias and arthralgias  Fatigue: Improving with activity Breast cancer surveillance: 1.  Breast exam 09/08/2017: Benign  2. mammogram 07/13/2016 at [facility]: Benign with breast density category A  Obesity: Patient is exercising more often  Return to clinic in 1 year for follow-up

## 2017-09-08 NOTE — Progress Notes (Signed)
Patient Care Team: Albina Billet, MD as PCP - General (Internal Medicine) Albina Billet, MD (Internal Medicine) Nicholas Lose, MD as Consulting Physician (Hematology and Oncology) Excell Seltzer, MD as Consulting Physician (General Surgery) Gery Pray, MD as Consulting Physician (Radiation Oncology) Delice Bison Charlestine Massed, NP as Nurse Practitioner (Hematology and Oncology)  DIAGNOSIS:  Encounter Diagnoses  Name Primary?  . Malignant neoplasm of lower-inner quadrant of right breast of female, estrogen receptor positive ([place])   . Malignant neoplasm of lower-inner quadrant of right female breast, unspecified estrogen receptor status ([place])   . Breast cancer of lower-inner quadrant of right female breast ([place])     SUMMARY OF ONCOLOGIC HISTORY:   Breast cancer of lower-inner quadrant of right female breast ([place])   05/14/2014 Mammogram    Right mammogram: 1.5 cm round mass in right breast at 3:00      05/14/2014 Breast US    Right breast: irregular, hypoechoic mass with indistinct margins at 3:00 mearying 1.1 x 0.8 x 1.3 cm.      05/16/2014 Initial Biopsy    Ultrasound-guided biopsy: Invasive ductal carcinoma with DCIS, grade 1-2, ER/PR positive, Ki-67 43%, HER-2 positive      05/28/2014 Breast MRI    1.4 x 1.2 x 1.0 cm biopsy-proven invasive ductal carcinoma and ductal carcinoma in situ in the lower inner quadrant of the right breast.       05/28/2014 Clinical Stage    Stage IA: T1c N0      06/13/2014 Procedure    Breast/Next: Variant of Unknown Significance in the ATM gene called p.[id number]>T, p.[id number]. Otherwise, no clinically significant mutation at ATM, BARD1, BRCA1, BRCA2, BRIP1, CDH1, CHEK2, MRE11A, MUTYH, NBN, NF1, PALB2, PTEN, RAD50, RAD51C, RAD51D, and TP53      06/18/2014 Surgery    Right breast lumpectomy: Invasive ductal carcinoma 1.3 cm negative for LVI; DCIS 1 mm from margins, 1 sentinel node negative, grade 3 ER 95%, PR 77%, HER-2 amplified ratio 8.25, Ki-67 43%      06/18/2014 Pathologic Stage    Stage IA: T1c N0 M0      07/18/2014 - 10/03/2014 Chemotherapy    Abraxane Herceptin weekly 12 followed by Herceptin maintenance every 3 weeks to complete one year completed 07/03/2015      10/30/2014 - 12/17/2014 Radiation Therapy    Adjuvant radiation therapy: Right breast 50.4 gray in 28 fractions, lumpectomy cavity boost 12 gray in 6 fractions      02/01/2015 -  Anti-estrogen oral therapy    Tamoxifen 20 mg daily       CHIEF COMPLIANT: Follow-up on tamoxifen therapy  INTERVAL HISTORY: MICAH GALENO is a 47 year old with above-mentioned history of right breast cancer treated with lumpectomy followed by adjuvant chemotherapy with Herceptin followed by radiation and is currently on tamoxifen.  She appears to be tolerating tamoxifen extremely well.  She does continue to have emotional problems with crying spells and anxiety.  The symptoms have improved slowly over time.  She also has occasional hot flashes and arthralgias and myalgias.  She denies any lumps or nodules in the breast.  REVIEW OF SYSTEMS:   Constitutional: Denies fevers, chills or abnormal weight loss Eyes: Denies blurriness of vision Ears, nose, mouth, throat, and face: Denies mucositis or sore throat Respiratory: Denies cough, dyspnea or wheezes Cardiovascular: Denies palpitation, chest discomfort Gastrointestinal:  Denies nausea, heartburn or change in bowel habits Skin: Denies abnormal skin rashes Lymphatics: Denies new lymphadenopathy or easy bruising Neurological:Denies numbness, tingling or new weaknesses Behavioral/Psych:  Mood is stable, no new changes  Extremities: No lower extremity edema Breast:  denies any pain or lumps or nodules in either breasts All other systems were reviewed with the patient and are negative.  I have reviewed the past medical history, past surgical history, social history and family history with the patient and they are unchanged from previous  note.  ALLERGIES:  has No Known Allergies.  MEDICATIONS:  Current Outpatient Medications  Medication Sig Dispense Refill  . BELVIQ 10 MG TABS Take 10 mg by mouth 2 (two) times daily. 60 tablet 1  . butalbital-acetaminophen-caffeine (FIORICET, ESGIC) 50-325-40 MG tablet Take 1-2 tablets by mouth every 6 (six) hours as needed for headache. (Patient not taking: Reported on 07/14/2017) 20 tablet 0  . cycloSPORINE (RESTASIS) 0.05 % ophthalmic emulsion Place 1 drop into both eyes 2 (two) times daily.    . Eluxadoline (VIBERZI) 100 MG TABS Take 1 tablet (100 mg total) by mouth 2 (two) times daily. 60 tablet 5  . escitalopram (LEXAPRO) 10 MG tablet Take 1 tablet (10 mg total) by mouth daily. 90 tablet 1  . furosemide (LASIX) 20 MG tablet Take 3 tablets (60 mg total) by mouth daily. 90 tablet 2  . hydrOXYzine (VISTARIL) 25 MG capsule Take 1 capsule (25 mg total) by mouth at bedtime as needed (sleep). (Patient not taking: Reported on 07/14/2017) 30 capsule 0  . meloxicam (MOBIC) 15 MG tablet Take 1 tablet (15 mg total) by mouth daily. 30 tablet 0  . oxyCODONE-acetaminophen (PERCOCET) 10-325 MG tablet Take 1 tablet by mouth every 4 (four) hours as needed for pain. (Patient not taking: Reported on 07/14/2017) 30 tablet 0  . predniSONE (DELTASONE) 10 MG tablet Take 1 tablet (10 mg total) by mouth daily. (Patient not taking: Reported on 07/14/2017) 42 tablet 0  . promethazine (PHENERGAN) 25 MG tablet Take 1 tablet (25 mg total) by mouth every 8 (eight) hours as needed. (Patient not taking: Reported on 07/14/2017) 20 tablet 0  . tamoxifen (NOLVADEX) 20 MG tablet Take 1 tablet (20 mg total) by mouth daily. 90 tablet 3   No current facility-administered medications for this visit.     PHYSICAL EXAMINATION: ECOG PERFORMANCE STATUS: 1 - Symptomatic but completely ambulatory  Vitals:   09/08/17 1216  BP: (!) 157/97  Pulse: 67  Resp: 18  Temp: 98.6 F (37 C)  SpO2: 100%   Filed Weights   09/08/17 1216   Weight: 265 lb 12.8 oz (120.6 kg)    GENERAL:alert, no distress and comfortable SKIN: skin color, texture, turgor are normal, no rashes or significant lesions EYES: normal, Conjunctiva are pink and non-injected, sclera clear OROPHARYNX:no exudate, no erythema and lips, buccal mucosa, and tongue normal  NECK: supple, thyroid normal size, non-tender, without nodularity LYMPH:  no palpable lymphadenopathy in the cervical, axillary or inguinal LUNGS: clear to auscultation and percussion with normal breathing effort HEART: regular rate & rhythm and no murmurs and no lower extremity edema ABDOMEN:abdomen soft, non-tender and normal bowel sounds MUSCULOSKELETAL:no cyanosis of digits and no clubbing  NEURO: alert & oriented x 3 with fluent speech, no focal motor/sensory deficits EXTREMITIES: No lower extremity edema BREAST: No palpable masses or nodules in either right or left breasts. No palpable axillary supraclavicular or infraclavicular adenopathy no breast tenderness or nipple discharge. (exam performed in the presence of a chaperone)  LABORATORY DATA:  I have reviewed the data as listed CMP Latest Ref Rng & Units 07/14/2017 01/05/2017 09/16/2016  Glucose 65 - 99 mg/dL  91 88 94  BUN 6 - 20 mg/dL '13 11 10  ' Creatinine 0.44 - 1.00 mg/dL 0.77 0.93 0.93  Sodium 135 - 145 mmol/L 141 139 139  Potassium 3.5 - 5.1 mmol/L 3.5 4.1 3.9  Chloride 101 - 111 mmol/L 105 104 103  CO2 22 - 32 mmol/L '30 26 29  ' Calcium 8.9 - 10.3 mg/dL 8.9 9.1 9.2  Total Protein 6.1 - 8.1 g/dL - 7.4 -  Total Bilirubin 0.2 - 1.2 mg/dL - 0.4 -  Alkaline Phos 38 - 126 U/L - - -  AST 10 - 35 U/L - 20 -  ALT 6 - 29 U/L - 22 -    Lab Results  Component Value Date   WBC 9.6 07/14/2017   HGB 12.9 07/14/2017   HCT 39.5 07/14/2017   MCV 95.6 07/14/2017   PLT 342 07/14/2017   NEUTROABS 11.1 (H) 02/20/2016    ASSESSMENT & PLAN:  Breast cancer of lower-inner quadrant of right female breast Right breast lumpectomy  06/18/2014: Invasive ductal carcinoma 1.3 cm negative for LVI; DCIS 1 mm from margins, 1 sentinel node negative, grade 3 ER 95%, PR 77%, HER-2 amplified ratio 8.25, Ki-67 43% T1 cN0 M0 stage IA  Treatment summary S/P Adjuvant chemo with Abraxane Herceptin started 07/18/2014 completed 10/03/14, Herceptin maintenance completed 07/03/2015 Status post adjuvant radiation completed 12/17/2014 Started tamoxifen 12/26/2014 ---------------------------------------------------------------------------- Current treatment:Tamoxifen 20 mg daily 12/26/2014  Tamoxifen toxicities:  1. Emotional changes especially crying spells and anxiety spells: Much improved Since she is seeing a therapist 2. Hot flashes. 3. Diffuse myalgias and arthralgias  Fatigue: Improving with activity Breast cancer surveillance: 1.  Breast exam 09/08/2017: Benign  2. mammogram 07/13/2016 at [facility]: Benign with breast density category A  Obesity: Patient gained 20 pounds because she had a foot surgery.  She just joined a gym and wants to lose weight. I will refer her to weight loss clinic. Leg swelling: I renewed her Lasix today.  I would like to check Her labs today. Return to clinic in 1 year for follow-up     Orders Placed This Encounter  Procedures  . CBC with Differential (Cancer Center Only)    Standing Status:   Future    Standing Expiration Date:   09/09/2018  . CMP ([place] only)    Standing Status:   Future    Standing Expiration Date:   09/09/2018   The patient has a good understanding of the overall plan. she agrees with it. she will call with any problems that may develop before the next visit here.   Harriette Ohara, MD 09/08/17

## 2017-09-14 ENCOUNTER — Encounter: Payer: Self-pay | Admitting: Podiatry

## 2017-09-14 ENCOUNTER — Ambulatory Visit: Payer: Medicaid Other | Admitting: Podiatry

## 2017-09-14 DIAGNOSIS — M722 Plantar fascial fibromatosis: Secondary | ICD-10-CM | POA: Diagnosis not present

## 2017-09-14 NOTE — Progress Notes (Signed)
She presents today for follow-up of plantar fasciitis to her left foot her right foot is doing pretty well.  Her left foot is giving her trouble.  Objective: Vital signs are stable she is alert and oriented x3 pulses are palpable.  Neurologic sensorium is intact.  Degenerative flexors are intact.  Muscle strength was 5/5 dorsal spine flexors inverters everters onto the musculature is intact.  She has pain on palpation medial calcaneal tubercle of the left heel.  Assessment: Plantar fasciitis of the left heel.  Plan: I injected the area today 20 mill grams Kenalog 5 mg Marcaine after sterile Betadine skin prep left heel.  Tolerated procedure well without complications follow-up with her in 6 to 8 weeks if necessary.

## 2017-09-15 ENCOUNTER — Encounter (HOSPITAL_COMMUNITY): Payer: Self-pay | Admitting: Licensed Clinical Social Worker

## 2017-09-15 ENCOUNTER — Ambulatory Visit (HOSPITAL_COMMUNITY): Payer: Self-pay | Admitting: Licensed Clinical Social Worker

## 2017-09-15 NOTE — Progress Notes (Signed)
Pt called today to say she was unable to come to her appt today because she needs to take the foster baby to the dr. However when I called her at the appt time the baby was at the day care and the appt was later. Therefore, pt was a no show/late cancel.

## 2017-09-22 ENCOUNTER — Ambulatory Visit (HOSPITAL_COMMUNITY): Payer: Self-pay | Admitting: Licensed Clinical Social Worker

## 2017-09-26 ENCOUNTER — Other Ambulatory Visit: Payer: Self-pay | Admitting: Hematology and Oncology

## 2017-09-26 DIAGNOSIS — C50311 Malignant neoplasm of lower-inner quadrant of right female breast: Secondary | ICD-10-CM

## 2017-09-28 ENCOUNTER — Telehealth: Payer: Self-pay | Admitting: *Deleted

## 2017-09-28 NOTE — Telephone Encounter (Signed)
Patient asked if you know if any other medication she can take for weight lost, was taking Belviq 10 tabs, but medication is $100 each month, I did explain to her that we are not aware of cost of medication. Pt said she had foot surgery in dec 208 and has gained weight and would like to get "back on track". Please advise

## 2017-09-29 ENCOUNTER — Ambulatory Visit (INDEPENDENT_AMBULATORY_CARE_PROVIDER_SITE_OTHER): Payer: Medicaid Other | Admitting: Licensed Clinical Social Worker

## 2017-09-29 ENCOUNTER — Encounter (HOSPITAL_COMMUNITY): Payer: Self-pay | Admitting: Licensed Clinical Social Worker

## 2017-09-29 DIAGNOSIS — F4310 Post-traumatic stress disorder, unspecified: Secondary | ICD-10-CM | POA: Diagnosis not present

## 2017-09-29 NOTE — Telephone Encounter (Signed)
Left detailed message on voicemail to schedule visit
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
## 2017-09-29 NOTE — Telephone Encounter (Signed)
Needs to schedule a weight loss visit.

## 2017-10-06 ENCOUNTER — Ambulatory Visit (HOSPITAL_COMMUNITY): Payer: Self-pay | Admitting: Licensed Clinical Social Worker

## 2017-10-13 ENCOUNTER — Telehealth: Payer: Self-pay | Admitting: *Deleted

## 2017-10-13 ENCOUNTER — Ambulatory Visit (HOSPITAL_COMMUNITY): Payer: Self-pay | Admitting: Licensed Clinical Social Worker

## 2017-10-13 NOTE — Telephone Encounter (Signed)
Patient called requesting 6 month supply for Belviq 10 mg tablets 1 po twice daily. Patient received refill on 07/28/17 (that she never picked up due to cost) your message to her was "Agree with Belviq x 2 months.  Then, will need a visit for weight management before prescribing Belviq for another 6 months as indicated." she found a assistance program to help cover cost, but is requesting a 6 month supply. I re-read your message to her that you only approved 2 month supply and I can approve only this amount. She said the program will only approve 6 months. I then suggested she schedule OV if the program won't allow her to have 2 months.  Patient only visit with you has been annual in 12/2016. Please advise

## 2017-10-14 ENCOUNTER — Telehealth (HOSPITAL_COMMUNITY): Payer: Self-pay | Admitting: Licensed Clinical Social Worker

## 2017-10-14 NOTE — Telephone Encounter (Signed)
Please bring in for weight management visit next week and will prescribe the 6 months after that evaluation.

## 2017-10-14 NOTE — Telephone Encounter (Signed)
Patient discharged from [facility] due to non-compliance of appt. Notice letter sent

## 2017-10-14 NOTE — Telephone Encounter (Signed)
Patient informed with the below note,. Transferred to front desk.

## 2017-10-24 ENCOUNTER — Encounter: Payer: Self-pay | Admitting: Obstetrics & Gynecology

## 2017-10-24 ENCOUNTER — Ambulatory Visit: Payer: Medicaid Other | Admitting: Obstetrics & Gynecology

## 2017-10-24 VITALS — BP 136/88 | Ht 63.5 in | Wt 269.0 lb

## 2017-10-24 DIAGNOSIS — Z6841 Body Mass Index (BMI) 40.0 and over, adult: Secondary | ICD-10-CM

## 2017-10-24 DIAGNOSIS — N898 Other specified noninflammatory disorders of vagina: Secondary | ICD-10-CM

## 2017-10-24 DIAGNOSIS — E66813 Obesity, class 3: Secondary | ICD-10-CM

## 2017-10-24 LAB — WET PREP FOR TRICH, YEAST, CLUE

## 2017-10-24 MED ORDER — BELVIQ 10 MG PO TABS: 10.0000 mg | ORAL_TABLET | Freq: Two times a day (BID) | ORAL | 5 refills | Status: DC

## 2017-10-24 NOTE — Progress Notes (Signed)
    PERL FOLMAR Feb 24, 1971 [id number]        47 y.o.  G0P0000 Single  RP: Obesity for weight management  HPI:  No menses currently on Tamoxifen for Rt Breast Ca.  Rt Breast lumpectomy 06/18/2014.  Post Radiation Therapy.  No pelvic pain.  Mild vaginal d/c, no itching/odor.  Mictions normal/BMs wnl.  Obesity with BMI at 42.54 on 01/05/2017 on Belviq at last Annual/Gyn visit.  Since then, had difficulty getting insurance coverage of Belviq, has been off of it for several months in the last year.  Keeps gaining weight with BMI now at 46.90 with an increase in weight of 25 Lbs in 9+ months.  Difficulty following a low calorie/carb diet.  Planning to go to the gym.     OB History  Gravida Para Term Preterm AB Living  0 0 0 0 0 0  SAB TAB Ectopic Multiple Live Births  0 0 0 0 0    Past medical history,surgical history, problem list, medications, allergies, family history and social history were all reviewed and documented in the EPIC chart.   Directed ROS with pertinent positives and negatives documented in the history of present illness/assessment and plan.  Exam:  Vitals:   10/24/17 1114  BP: 136/88   General appearance:  Normal  Gynecologic exam: Vulva normal.  Speculum:  Cervix/Vagina normal.  Vaginal secretions wnl.  Wet prep negative   Assessment/Plan:  47 y.o. G0P0000   1. Class 3 severe obesity due to excess calories without serious comorbidity with body mass index (BMI) of 40.0 to 44.9 in adult [facility]) Severe obesity with recent further increase in weight.  Patient will benefit restarting Belviq.  Belviq prescribed.  Recommend low calorie/carb diet such as [employer] and regular physical activity with aerobic activities 5 times a week and weightlifting every 2 days.  2. Vaginal discharge Wet prep negative, reassured.  Probiotic tab/supp vaginally weekly as needed. - WET PREP FOR [place], YEAST, CLUE  Other orders - BELVIQ 10 MG TABS; Take 10 mg by mouth 2 (two)  times daily.  Counseling on above issues and coordination of care more than 50% for 25 minutes.  [name] MD, 11:28 AM 10/24/2017

## 2017-10-26 ENCOUNTER — Encounter: Payer: Self-pay | Admitting: Obstetrics & Gynecology

## 2017-10-26 NOTE — Patient Instructions (Addendum)
1. Class 3 severe obesity due to excess calories without serious comorbidity with body mass index (BMI) of 40.0 to 44.9 in adult [facility]) Severe obesity with recent further increase in weight.  Patient will benefit restarting Belviq.  Belviq prescribed.  Recommend low calorie/carb diet such as [place] and regular physical activity with aerobic activities 5 times a week and weightlifting every 2 days.  2. Vaginal discharge Wet prep negative, reassured.  Probiotic tab/supp vaginally weekly as needed. - WET PREP FOR [place], YEAST, CLUE  Other orders - BELVIQ 10 MG TABS; Take 10 mg by mouth 2 (two) times daily.  Krystal Kelley, it was a pleasure seeing you today!  I will see you again at your annual gynecologic exam and weight management visit in September 2019.

## 2017-10-27 ENCOUNTER — Ambulatory Visit (HOSPITAL_COMMUNITY): Payer: Self-pay | Admitting: Licensed Clinical Social Worker

## 2017-10-31 ENCOUNTER — Telehealth: Payer: Self-pay | Admitting: *Deleted

## 2017-10-31 MED ORDER — BELVIQ 10 MG PO TABS: 10.0000 mg | ORAL_TABLET | Freq: Two times a day (BID) | ORAL | 5 refills | Status: DC

## 2017-10-31 NOTE — Telephone Encounter (Signed)
Patient called because Belviq 10 mg tablet was not received at pharmacy, Rx must be called in to pharmacy. Rx called into Rx outreach. Pt aware.

## 2017-11-02 ENCOUNTER — Ambulatory Visit: Payer: Medicare Other | Admitting: Podiatry

## 2017-11-02 ENCOUNTER — Encounter: Payer: Self-pay | Admitting: Podiatry

## 2017-11-02 DIAGNOSIS — M722 Plantar fascial fibromatosis: Secondary | ICD-10-CM | POA: Diagnosis not present

## 2017-11-02 NOTE — Progress Notes (Signed)
She presents today for follow-up of her plantar fasciitis states that going on a trip in both of my heels are hurting.  Objective: Vital signs temperature alert and oriented x3.  Pulses are palpable.  She has pain on palpation medial calcaneal tubercles bilateral.  Assessment: Plantar fascitis bilateral.  Plan: Injected 20 mg Kenalog 5 mg Marcaine medial aspect of bilateral heels after sterile Betadine skin prep tolerated procedure well follow-up with me in 6 weeks

## 2017-11-03 ENCOUNTER — Ambulatory Visit (HOSPITAL_COMMUNITY): Payer: Self-pay | Admitting: Licensed Clinical Social Worker

## 2017-11-10 ENCOUNTER — Ambulatory Visit (HOSPITAL_COMMUNITY): Payer: Self-pay | Admitting: Licensed Clinical Social Worker

## 2017-11-16 ENCOUNTER — Ambulatory Visit (INDEPENDENT_AMBULATORY_CARE_PROVIDER_SITE_OTHER): Payer: Medicare Other | Admitting: Podiatry

## 2017-11-16 DIAGNOSIS — M722 Plantar fascial fibromatosis: Secondary | ICD-10-CM | POA: Diagnosis not present

## 2017-11-16 NOTE — Progress Notes (Signed)
She presents today for follow-up of her plantar fasciitis.  States that her right foot is doing great her left foot has not been a little bit overworked recently so she feels that the left foot was slower to get better however she is noticed some pain to the dorsal lateral aspect as she points to the fourth fifth tarsometatarsal joint left.  Objective: Vital signs are stable she is alert and oriented x3 no pain on palpation medial calcaneal tubercles bilateral she does have some tenderness on palpation of the fourth fifth tarsometatarsal joint left foot.  No pain on palpation of the sinus tarsi no pain on range of motion.  Assessment: Capsulitis dorsal lateral aspect of the left foot.  Plan: I injected the area today with 20 mg Kenalog 5 mg Marcaine point maximal tenderness left tolerated procedure well follow-up with her in 1 month.

## 2017-12-03 ENCOUNTER — Other Ambulatory Visit: Payer: Self-pay | Admitting: Hematology and Oncology

## 2017-12-14 ENCOUNTER — Ambulatory Visit (INDEPENDENT_AMBULATORY_CARE_PROVIDER_SITE_OTHER): Payer: Medicare Other | Admitting: Podiatry

## 2017-12-14 ENCOUNTER — Encounter: Payer: Self-pay | Admitting: Podiatry

## 2017-12-14 DIAGNOSIS — M722 Plantar fascial fibromatosis: Secondary | ICD-10-CM | POA: Diagnosis not present

## 2017-12-14 NOTE — Progress Notes (Signed)
She presents today for follow-up of pain to the dorsal lateral aspect of her left foot.  Objective: She states that she is doing much better at this point and states that she is no pain on palpation of the dorsal lateral aspect of the foot or the plantar fascial calcaneal insertion site left.  Assessment: Resolving plantar fasciitis and lateral compensatory capsulitis.  Plan: Follow-up with me in 1 month before she goes to [employer].

## 2017-12-27 ENCOUNTER — Ambulatory Visit (HOSPITAL_COMMUNITY): Payer: Self-pay | Admitting: Psychiatry

## 2017-12-29 ENCOUNTER — Emergency Department
Admission: EM | Admit: 2017-12-29 | Discharge: 2017-12-30 | Disposition: A | Discharge status: Home / Self Care | Payer: Medicare Other | Attending: Emergency Medicine | Admitting: Emergency Medicine

## 2017-12-29 ENCOUNTER — Other Ambulatory Visit: Payer: Self-pay

## 2017-12-29 DIAGNOSIS — Z79899 Other long term (current) drug therapy: Secondary | ICD-10-CM | POA: Insufficient documentation

## 2017-12-29 DIAGNOSIS — R1032 Left lower quadrant pain: Secondary | ICD-10-CM

## 2017-12-29 DIAGNOSIS — Z853 Personal history of malignant neoplasm of breast: Secondary | ICD-10-CM | POA: Insufficient documentation

## 2017-12-29 DIAGNOSIS — D259 Leiomyoma of uterus, unspecified: Secondary | ICD-10-CM | POA: Diagnosis not present

## 2017-12-29 LAB — URINALYSIS, COMPLETE (UACMP) WITH MICROSCOPIC
Bilirubin Urine: NEGATIVE
Glucose, UA: NEGATIVE mg/dL
Hgb urine dipstick: NEGATIVE
Ketones, ur: NEGATIVE mg/dL
Nitrite: NEGATIVE
Protein, ur: NEGATIVE mg/dL
Specific Gravity, Urine: 1.018 (ref 1.005–1.030)
WBC, UA: >50 WBC/hpf — ABNORMAL HIGH (ref 0–5)
pH: 6 (ref 5.0–8.0)

## 2017-12-29 LAB — CBC
HCT: 38.2 % (ref 35.0–47.0)
Hemoglobin: 13.2 g/dL (ref 12.0–16.0)
MCH: 31.8 pg (ref 26.0–34.0)
MCHC: 34.5 g/dL (ref 32.0–36.0)
MCV: 92.2 fL (ref 80.0–100.0)
Platelets: 289 K/uL (ref 150–440)
RBC: 4.14 MIL/uL (ref 3.80–5.20)
RDW: 13.7 % (ref 11.5–14.5)
WBC: 8.2 10*3/uL (ref 3.6–11.0)

## 2017-12-29 NOTE — ED Notes (Signed)
Pt reports pain every time she eats to left lower quad (ABD) PT OBSERVED EATING TACO PLATE THROUGHOUT IV PLACEMENT AND BLOOD DRAW . BLOOD TUBES SENT TO LAB AT THIS TIME

## 2017-12-29 NOTE — ED Triage Notes (Signed)
Pt in with co left sided abd pain for months states today became worse. Pt denies any n.v.d/

## 2017-12-29 NOTE — ED Provider Notes (Signed)
[facility] Emergency Department Provider Note  ____________________________________________   First MD Initiated Contact with Patient 12/29/17 2326     (approximate)  I have reviewed the triage vital signs and the nursing notes.   HISTORY  Chief Complaint Abdominal Pain   HPI Krystal Kelley is a 47 y.o. female who self presents to the emergency department with several months of daily left lower quadrant pain.  Pain is mild to moderate aching nonradiating.  Today she was at dinner with her friend who advised her to come to the emergency department to "finally get it checked out".  No nausea or vomiting.  No diarrhea.  She does have a previous cholecystectomy.  Normal bowel movements and flatus.  No chest pain or shortness of breath.  No dysuria frequency hesitancy or back pain.  The pain is throbbing aching mild to moderate in severity and nothing in particular seems to make it better or worse.    Past Medical History:  Diagnosis Date  . Anxiety   . Breast cancer, right breast ([place])   . Depression   . Peripheral edema    on lasix  . Radiation 10/30/14-12/17/14   right breast 50.4 gray    Patient Active Problem List   Diagnosis Date Noted  . Posttraumatic stress disorder 12/09/2016  . Leg edema, right 10/03/2014  . Genetic testing 07/10/2014  . Breast cancer of lower-inner quadrant of right female breast ([place]) 05/29/2014    Past Surgical History:  Procedure Laterality Date  . BREAST BIOPSY  05/16/14  . BREAST LUMPECTOMY Right 06/18/2014  . bunion surgrery    . CARPAL TUNNEL RELEASE Left 09/21/2016   Procedure: LEFT CARPAL TUNNEL RELEASE;  Surgeon: Daryll Brod, MD;  Location: [facility];  Service: Orthopedics;  Laterality: Left;  REG/FAB  . carpel tunnel    . CHOLECYSTECTOMY  2015  . PLANTAR FASCIECTOMY    . PORTACATH PLACEMENT Left 06/18/2014   Procedure: INSERTION PORT-A-CATH;  Surgeon: Excell Seltzer, MD;  Location: [facility];  Service: General;  Laterality: Left;    Prior to Admission medications   Medication Sig Start Date End Date Taking? Authorizing Provider  BELVIQ 10 MG TABS Take 10 mg by mouth 2 (two) times daily. 10/31/17   [name], MD  cycloSPORINE (RESTASIS) 0.05 % ophthalmic emulsion Place 1 drop into both eyes 2 (two) times daily.    [provider]  Eluxadoline (VIBERZI) 100 MG TABS Take 1 tablet (100 mg total) by mouth 2 (two) times daily. 01/18/17   Lucilla Lame, MD  escitalopram (LEXAPRO) 10 MG tablet Take 1 tablet (10 mg total) by mouth daily. 06/21/17 06/21/18  Aundra Dubin, MD  furosemide (LASIX) 20 MG tablet TAKE 3 TABLETS(60 MG) BY MOUTH DAILY 12/05/17   Nicholas Lose, MD  furosemide (LASIX) 40 MG tablet TAKE ONE TABLET BY MOUTH EVERY DAY AS NEEDED 09/26/17   Nicholas Lose, MD  meloxicam (MOBIC) 15 MG tablet Take 1 tablet (15 mg total) by mouth daily. 06/07/17   Cuthriell, Charline Bills, PA-C  tamoxifen (NOLVADEX) 20 MG tablet Take 1 tablet (20 mg total) by mouth daily. 09/08/17   Nicholas Lose, MD    Allergies Patient has no known allergies.  Family History  Problem Relation Age of Onset  . Kidney cancer Father        kidney  . Prostate cancer Maternal Uncle        deceased 68  . Lung cancer Maternal Grandfather  deceased 65; smoker  . Colon cancer Paternal Grandfather        deceased 34s  . Cancer Other        pat half-sister; ? uterine vs. ovarian ca    Social History Social History   Tobacco Use  . Smoking status: Never Smoker  . Smokeless tobacco: Never Used  Substance Use Topics  . Alcohol use: Yes    Comment: wine- occ   . Drug use: No    Review of Systems Constitutional: No fever/chills Eyes: No visual changes. ENT: No sore throat. Cardiovascular: Denies chest pain. Respiratory: Denies shortness of breath. Gastrointestinal: Positive for abdominal pain.  No nausea, no vomiting.  No diarrhea.  No constipation. Genitourinary:  Negative for dysuria. Musculoskeletal: Negative for back pain. Skin: Negative for rash. Neurological: Negative for headaches, focal weakness or numbness.   ____________________________________________   PHYSICAL EXAM:  VITAL SIGNS: ED Triage Vitals [12/29/17 2317]  Enc Vitals Group     BP 126/69     Pulse Rate 81     Resp 20     Temp 98.7 F (37.1 C)     Temp Source Oral     SpO2 98 %     Weight 262 lb (118.8 kg)     Height 5\' 5"  (1.651 m)     Head Circumference      Peak Flow      Pain Score 5     Pain Loc      Pain Edu?      Excl. in [place]?     Constitutional: Alert and oriented x4 joking laughing pleasant cooperative speaks in complete sentences no diaphoresis Eyes: PERRL EOMI. Head: Atraumatic. Nose: No congestion/rhinnorhea. Mouth/Throat: No trismus Neck: No stridor.   Cardiovascular: Normal rate, regular rhythm. Grossly normal heart sounds.  Good peripheral circulation. Respiratory: Normal respiratory effort.  No retractions. Lungs CTAB and moving good air Gastrointestinal: Morbidly obese abdomen soft nondistended nontender although with exquisite rebound in the left lower quadrant.  Negative Rovsing's.  No costovertebral tenderness Musculoskeletal: No lower extremity edema   Neurologic:  Normal speech and language. No gross focal neurologic deficits are appreciated. Skin:  Skin is warm, dry and intact. No rash noted. Psychiatric: Mood and affect are normal. Speech and behavior are normal.    ____________________________________________   DIFFERENTIAL includes but not limited to  UTI, pyelonephritis, diverticulitis, bowel obstruction ____________________________________________   LABS (all labs ordered are listed, but only abnormal results are displayed)  Labs Reviewed  URINE CULTURE - Abnormal; Notable for the following components:      Result Value   Culture MULTIPLE SPECIES PRESENT, SUGGEST RECOLLECTION (*)    All other components within normal  limits  COMPREHENSIVE METABOLIC PANEL - Abnormal; Notable for the following components:   Potassium 3.4 (*)    Glucose, Bld 103 (*)    All other components within normal limits  URINALYSIS, COMPLETE (UACMP) WITH MICROSCOPIC - Abnormal; Notable for the following components:   Color, Urine YELLOW (*)    APPearance CLOUDY (*)    Leukocytes, UA LARGE (*)    WBC, UA >50 (*)    Bacteria, UA RARE (*)    Non Squamous Epithelial PRESENT (*)    All other components within normal limits  CBC    Lab work reviewed by me with a large amount of white cells in her urine although it is a dirty sample and difficult to fully evaluate otherwise unremarkable __________________________________________  EKG   ____________________________________________  RADIOLOGY  CT abdomen pelvis reviewed by me with no acute disease.  Does show leiomyoma in the uterus as well as a small umbilical hernia with only fat ____________________________________________   PROCEDURES  Procedure(s) performed: no  Procedures  Critical Care performed: no  ____________________________________________   INITIAL IMPRESSION / ASSESSMENT AND PLAN / ED COURSE  Pertinent labs & imaging results that were available during my care of the patient were reviewed by me and considered in my medical decision making (see chart for details).   As part of my medical decision making, I reviewed the following data within the [facility] History obtained from family if available, nursing notes, old chart and ekg, as well as notes from prior ED visits.      ----------------------------------------- 12:15 AM on 12/30/2017 -----------------------------------------  The patient has very little tenderness in her left lower quadrant although exquisite rebound and guarding.  No costovertebral tenderness.  CT scan abdomen pelvis is pending.  I offered IV pain control however the patient declined.  CT scan shows no acute  intra-abdominal pathology aside from leiomyomatous uterus.  She says that she has been told she would benefit from hysterectomy in the past however she has been too afraid.  I will refer her back to [facility] gynecology for further evaluation.  Again she declines pain medication.  She is able to eat and drink.  Strict return precautions have been given.  ____________________________________________   FINAL CLINICAL IMPRESSION(S) / ED DIAGNOSES  Final diagnoses:  Left lower quadrant pain  Uterine leiomyoma, unspecified location      NEW MEDICATIONS STARTED DURING THIS VISIT:  Discharge Medication List as of 12/30/2017  1:44 AM       Note:  This document was prepared using Dragon voice recognition software and may include unintentional dictation errors.     Darel Hong, MD 01/01/18 [phone]

## 2017-12-30 ENCOUNTER — Emergency Department: Payer: Medicare Other

## 2017-12-30 ENCOUNTER — Encounter: Payer: Self-pay | Admitting: Radiology

## 2017-12-30 DIAGNOSIS — D259 Leiomyoma of uterus, unspecified: Secondary | ICD-10-CM | POA: Diagnosis not present

## 2017-12-30 LAB — COMPREHENSIVE METABOLIC PANEL WITH GFR
Alkaline Phosphatase: 49 U/L (ref 38–126)
Anion gap: 8 (ref 5–15)
CO2: 30 mmol/L (ref 22–32)
Calcium: 8.9 mg/dL (ref 8.9–10.3)
Creatinine, Ser: 0.92 mg/dL (ref 0.44–1.00)
GFR calc Af Amer: >60 mL/min (ref >60)
GFR calc non Af Amer: >60 mL/min (ref >60)
Sodium: 141 mmol/L (ref 135–145)
Total Bilirubin: 0.4 mg/dL (ref 0.3–1.2)
Total Protein: 7.5 g/dL (ref 6.5–8.1)

## 2017-12-30 LAB — COMPREHENSIVE METABOLIC PANEL
ALT: 29 U/L (ref 0–44)
AST: 30 U/L (ref 15–41)
Albumin: 3.7 g/dL (ref 3.5–5.0)
BUN: 12 mg/dL (ref 6–20)
Chloride: 103 mmol/L (ref 98–111)
Glucose, Bld: 103 mg/dL — ABNORMAL HIGH (ref 70–99)
Potassium: 3.4 mmol/L — ABNORMAL LOW (ref 3.5–5.1)

## 2017-12-30 MED ORDER — IOPAMIDOL (ISOVUE-300) INJECTION 61%: 100.0000 mL | Freq: Once | INTRAVENOUS | Status: DC | PRN

## 2017-12-30 MED ORDER — IOPAMIDOL (ISOVUE-300) INJECTION 61%: 30.0000 mL | Freq: Once | INTRAVENOUS | Status: DC

## 2017-12-30 MED ORDER — IOPAMIDOL (ISOVUE-300) INJECTION 61%
100.0000 mL | Freq: Once | INTRAVENOUS | Status: AC | PRN
  Administered 2017-12-30: 100 mL via INTRAVENOUS

## 2017-12-30 NOTE — Discharge Instructions (Signed)
Fortunately today your lab work was reassuring.  Please follow-up with your OB gynecologist within 2 weeks for recheck as I think most of your pain is probably coming from fibroids.  Return to the emergency department for any concerns.  It was a pleasure to take care of you today, and thank you for coming to our emergency department.  If you have any questions or concerns before leaving please ask the nurse to grab me and I'm more than happy to go through your aftercare instructions again.  If you were prescribed any opioid pain medication today such as Norco, Vicodin, Percocet, morphine, hydrocodone, or oxycodone please make sure you do not drive when you are taking this medication as it can alter your ability to drive safely.  If you have any concerns once you are home that you are not improving or are in fact getting worse before you can make it to your follow-up appointment, please do not hesitate to call 911 and come back for further evaluation.  Darel Hong, MD  Results for orders placed or performed during the hospital encounter of 12/29/17  CBC  Result Value Ref Range   WBC 8.2 3.6 - 11.0 K/uL   RBC 4.14 3.80 - 5.20 MIL/uL   Hemoglobin 13.2 12.0 - 16.0 g/dL   HCT 38.2 35.0 - 47.0 %   MCV 92.2 80.0 - 100.0 fL   MCH 31.8 26.0 - 34.0 pg   MCHC 34.5 32.0 - 36.0 g/dL   RDW 13.7 11.5 - 14.5 %   Platelets 289 150 - 440 K/uL  Comprehensive metabolic panel  Result Value Ref Range   Sodium 141 135 - 145 mmol/L   Potassium 3.4 (L) 3.5 - 5.1 mmol/L   Chloride 103 98 - 111 mmol/L   CO2 30 22 - 32 mmol/L   Glucose, Bld 103 (H) 70 - 99 mg/dL   BUN 12 6 - 20 mg/dL   Creatinine, Ser 0.92 0.44 - 1.00 mg/dL   Calcium 8.9 8.9 - 10.3 mg/dL   Total Protein 7.5 6.5 - 8.1 g/dL   Albumin 3.7 3.5 - 5.0 g/dL   AST 30 15 - 41 U/L   ALT 29 0 - 44 U/L   Alkaline Phosphatase 49 38 - 126 U/L   Total Bilirubin 0.4 0.3 - 1.2 mg/dL   GFR calc non Af Amer >60 >60 mL/min   GFR calc Af Amer >60 >60  mL/min   Anion gap 8 5 - 15  Urinalysis, Complete w Microscopic  Result Value Ref Range   Color, Urine YELLOW (A) YELLOW   APPearance CLOUDY (A) CLEAR   Specific Gravity, Urine 1.018 1.005 - 1.030   pH 6.0 5.0 - 8.0   Glucose, UA NEGATIVE NEGATIVE mg/dL   Hgb urine dipstick NEGATIVE NEGATIVE   Bilirubin Urine NEGATIVE NEGATIVE   Ketones, ur NEGATIVE NEGATIVE mg/dL   Protein, ur NEGATIVE NEGATIVE mg/dL   Nitrite NEGATIVE NEGATIVE   Leukocytes, UA LARGE (A) NEGATIVE   RBC / HPF 6-10 0 - 5 RBC/hpf   WBC, UA >50 (H) 0 - 5 WBC/hpf   Bacteria, UA RARE (A) NONE SEEN   Squamous Epithelial / LPF 11-20 0 - 5   Non Squamous Epithelial PRESENT (A) NONE SEEN   Ct Abdomen Pelvis W Contrast  Result Date: 12/30/2017 CLINICAL DATA:  LEFT side abdominal pain for months worse today, history breast cancer EXAM: CT ABDOMEN AND PELVIS WITH CONTRAST TECHNIQUE: Multidetector CT imaging of the abdomen and pelvis was performed  using the standard protocol following bolus administration of intravenous contrast. Sagittal and coronal MPR images reconstructed from axial data set. CONTRAST:  142mL ISOVUE-300 IOPAMIDOL (ISOVUE-300) INJECTION 61% IV. No oral contrast. COMPARISON:  02/19/2016 FINDINGS: Lower chest: Lung bases clear Hepatobiliary: Post cholecystectomy. Fatty infiltration of liver. No biliary dilatation. Pancreas: Normal appearance Spleen: Normal appearance Adrenals/Urinary Tract: Adrenal glands, kidneys, ureters, and decompressed bladder normal appearance Stomach/Bowel: Normal appendix. Stomach and bowel loops normal appearance. Vascular/Lymphatic: Aorta normal caliber.  No adenopathy. Reproductive: Enlarged multinodular uterus containing multiple leiomyomata up to 6.3 x 5.6 cm. Unremarkable adnexa. Other: Umbilical hernia containing fat. No free air or free fluid. No focal inflammatory process. Musculoskeletal: Osseous structures unremarkable. IMPRESSION: Fatty infiltration of liver. Enlarged uterus containing  multiple leiomyomata. Small umbilical hernia containing fat. No acute intra-abdominal or intrapelvic abnormalities. Electronically Signed   By: Lavonia Dana M.D.   On: 12/30/2017 00:59

## 2017-12-31 LAB — URINE CULTURE

## 2018-01-06 ENCOUNTER — Ambulatory Visit (INDEPENDENT_AMBULATORY_CARE_PROVIDER_SITE_OTHER): Payer: Medicare Other | Admitting: Obstetrics & Gynecology

## 2018-01-06 ENCOUNTER — Encounter: Payer: Self-pay | Admitting: Obstetrics & Gynecology

## 2018-01-06 VITALS — BP 146/90 | Ht 63.5 in | Wt 268.0 lb

## 2018-01-06 DIAGNOSIS — Z01419 Encounter for gynecological examination (general) (routine) without abnormal findings: Secondary | ICD-10-CM

## 2018-01-06 DIAGNOSIS — Z170 Estrogen receptor positive status [ER+]: Secondary | ICD-10-CM

## 2018-01-06 DIAGNOSIS — Z6841 Body Mass Index (BMI) 40.0 and over, adult: Secondary | ICD-10-CM

## 2018-01-06 DIAGNOSIS — Z1151 Encounter for screening for human papillomavirus (HPV): Secondary | ICD-10-CM

## 2018-01-06 DIAGNOSIS — D219 Benign neoplasm of connective and other soft tissue, unspecified: Secondary | ICD-10-CM | POA: Diagnosis not present

## 2018-01-06 DIAGNOSIS — Z113 Encounter for screening for infections with a predominantly sexual mode of transmission: Secondary | ICD-10-CM | POA: Diagnosis not present

## 2018-01-06 DIAGNOSIS — Z853 Personal history of malignant neoplasm of breast: Secondary | ICD-10-CM

## 2018-01-06 DIAGNOSIS — E66813 Obesity, class 3: Secondary | ICD-10-CM

## 2018-01-06 DIAGNOSIS — Z3009 Encounter for other general counseling and advice on contraception: Secondary | ICD-10-CM | POA: Diagnosis not present

## 2018-01-06 DIAGNOSIS — C50311 Malignant neoplasm of lower-inner quadrant of right female breast: Secondary | ICD-10-CM

## 2018-01-06 NOTE — Progress Notes (Signed)
Krystal Kelley [birth year] [id number]   History:    47 y.o. G0 Single  RP:  Established patient presenting for annual gyn exam   HPI: Recent surgery to both feet, well healed now.  Rt Breast Cancer, lumpectomy 06/2014, ChemoRx, then Radiation Therapy. On Tamoxifen.  No menses since 2016.  Recent left flank pain.  CT scan showed uterine fibroids.  No pain currently.  Urine/BMs wnl.  BMI 46.73.  Patient just started on Belviq.  Quit drinking sodas.  Swimming and stationary bike.  On Lexapro x 6 months, no depressive Sx at all, would like to stop.  Past medical history,surgical history, family history and social history were all reviewed and documented in the EPIC chart.  Gynecologic History No LMP recorded. Patient is postmenopausal. Contraception: abstinence Last Pap: 12/2016. Results were: Negative/HPV HR neg Last mammogram: 07/2016. Results were: [place] benign Bone Density: Never Colonoscopy: Never  Obstetric History OB History  Gravida Para Term Preterm AB Living  0 0 0 0 0 0  SAB TAB Ectopic Multiple Live Births  0 0 0 0 0     ROS: A ROS was performed and pertinent positives and negatives are included in the history.  GENERAL: No fevers or chills. HEENT: No change in vision, no earache, sore throat or sinus congestion. NECK: No pain or stiffness. CARDIOVASCULAR: No chest pain or pressure. No palpitations. PULMONARY: No shortness of breath, cough or wheeze. GASTROINTESTINAL: No abdominal pain, nausea, vomiting or diarrhea, melena or bright red blood per rectum. GENITOURINARY: No urinary frequency, urgency, hesitancy or dysuria. MUSCULOSKELETAL: No joint or muscle pain, no back pain, no recent trauma. DERMATOLOGIC: No rash, no itching, no lesions. ENDOCRINE: No polyuria, polydipsia, no heat or cold intolerance. No recent change in weight. HEMATOLOGICAL: No anemia or easy bruising or bleeding. NEUROLOGIC: No headache, seizures, numbness, tingling or weakness. PSYCHIATRIC: No depression, no  loss of interest in normal activity or change in sleep pattern.     Exam:   BP (!) 146/90   Ht 5' 3.5" (1.613 m)   Wt 268 lb (121.6 kg)   BMI 46.73 kg/m   Body mass index is 46.73 kg/m.  General appearance : Well developed well nourished female. No acute distress HEENT: Eyes: no retinal hemorrhage or exudates,  Neck supple, trachea midline, no carotid bruits, no thyroidmegaly Lungs: Clear to auscultation, no rhonchi or wheezes, or rib retractions  Heart: Regular rate and rhythm, no murmurs or gallops Breast:Examined in sitting and supine position were symmetrical in appearance, no palpable masses or tenderness,  no skin retraction, no nipple inversion, no nipple discharge, no skin discoloration, no axillary or supraclavicular lymphadenopathy Abdomen: no palpable masses or tenderness, no rebound or guarding Extremities: no edema or skin discoloration or tenderness  Pelvic: Vulva: Normal             Vagina: No gross lesions or discharge  Cervix: No gross lesions or discharge.  Pap/HPV HR, Gono-Chlam done  Uterus  AV, increased size, nodular, non-tender and mobile  Adnexa  Without masses or tenderness  Anus: Normal  CT Pelvis 12/2017:  Fibromatous uterus.  Largest fibroid 6.3 cm.   Assessment/Plan:  47 y.o. female for annual exam   1. Encounter for routine gynecological examination with Papanicolaou smear of cervix Gynecologic exam with a fibromatous uterus.  Pap with high-risk HPV done today.  Breast exam normal except for right breast with post radiation therapy effect.  Fasting health labs here today. - CBC - Comp Met (CMET) -  Lipid panel - TSH - VITAMIN D 25 Hydroxy (Vit-D Deficiency, Fractures)  2. Encounter for other general counseling or advice on contraception Abstinence  3. Malignant neoplasm of lower-inner quadrant of right breast of female, estrogen receptor positive ([place]) Followed by Dr. Lindi Adie  4. Screen for STD (sexually transmitted disease) - Gono-Chlam on  Pap - HIV antibody (with reflex) - RPR - Hepatitis C Antibody - Hepatitis B Surface AntiGEN  5. Fibroids Recent left flank pain for which a CT scan was done.  Fibromatous uterus on CT scan.  Will further evaluate by pelvic ultrasound. - US Transvaginal Non-OB; Future  6. Class 3 severe obesity due to excess calories without serious comorbidity with body mass index (BMI) of 45.0 to 49.9 in adult [facility]) Body mass index of 46.73.  Patient is very motivated to lose weight and improve her fitness.  Recommend aerobic physical activity at least 5 times a week in the form of swimming and stationary biking.  Also recommend weight lifting every 2 days.  Could bring a small weights during her walks outside.  Patient has just started Belviq.  Very important to start a low calorie/carb diet at the same time.  [place] diet phase 1 alternating with phase 2.  Follow-up for weight management in 3 months.  Counseling on above issues and coordination of care more than 50% for 10 minutes.  [name] MD, 12:12 PM 01/06/2018

## 2018-01-06 NOTE — Patient Instructions (Signed)
1. Encounter for routine gynecological examination with Papanicolaou smear of cervix Gynecologic exam with a fibromatous uterus.  Pap with high-risk HPV done today.  Breast exam normal except for right breast with post radiation therapy effect.  Fasting health labs here today. - CBC - Comp Met (CMET) - Lipid panel - TSH - VITAMIN D 25 Hydroxy (Vit-D Deficiency, Fractures)  2. Encounter for other general counseling or advice on contraception Abstinence  3. Malignant neoplasm of lower-inner quadrant of right breast of female, estrogen receptor positive ([place]) Followed by Dr. Lindi Adie  4. Screen for STD (sexually transmitted disease) - Gono-Chlam on Pap - HIV antibody (with reflex) - RPR - Hepatitis C Antibody - Hepatitis B Surface AntiGEN  5. Fibroids Recent left flank pain for which a CT scan was done.  Fibromatous uterus on CT scan.  Will further evaluate by pelvic ultrasound. - US Transvaginal Non-OB; Future  6. Class 3 severe obesity due to excess calories without serious comorbidity with body mass index (BMI) of 45.0 to 49.9 in adult [facility]) Body mass index of 46.73.  Patient is very motivated to lose weight and improve her fitness.  Recommend aerobic physical activity at least 5 times a week in the form of swimming and stationary biking.  Also recommend weight lifting every 2 days.  Could bring a small weights during her walks outside.  Patient has just started Belviq.  Very important to start a low calorie/carb diet at the same time.  [place] diet phase 1 alternating with phase 2.  Follow-up for weight management in 3 months.  Krystal Kelley, it was a pleasure seeing you today!  I will inform you of your results as soon as they are available.

## 2018-01-09 ENCOUNTER — Other Ambulatory Visit: Payer: Self-pay | Admitting: Hematology and Oncology

## 2018-01-09 LAB — COMPREHENSIVE METABOLIC PANEL WITH GFR
ALT: 24 U/L (ref 6–29)
AST: 24 U/L (ref 10–35)
BUN: 11 mg/dL (ref 7–25)
Creat: 0.89 mg/dL (ref 0.50–1.10)
Glucose, Bld: 97 mg/dL (ref 65–99)

## 2018-01-09 LAB — LIPID PANEL
Cholesterol: 158 mg/dL (ref <200)
HDL: 71 mg/dL (ref >50)
LDL Cholesterol (Calc): 68 mg/dL
Non-HDL Cholesterol (Calc): 87 mg/dL (ref <130)
Total CHOL/HDL Ratio: 2.2 (calc) (ref <5.0)
Triglycerides: 106 mg/dL (ref <150)

## 2018-01-09 LAB — CBC
HCT: 37.2 % (ref 35.0–45.0)
Hemoglobin: 12.6 g/dL (ref 11.7–15.5)
MCH: 31.3 pg (ref 27.0–33.0)
MCHC: 33.9 g/dL (ref 32.0–36.0)
MCV: 92.5 fL (ref 80.0–100.0)
MPV: 9.9 fL (ref 7.5–12.5)
Platelets: 275 Thousand/uL (ref 140–400)
RBC: 4.02 10*6/uL (ref 3.80–5.10)
RDW: 13 % (ref 11.0–15.0)
WBC: 7.3 Thousand/uL (ref 3.8–10.8)

## 2018-01-09 LAB — COMPREHENSIVE METABOLIC PANEL
AG Ratio: 1.4 (calc) (ref 1.0–2.5)
Albumin: 4 g/dL (ref 3.6–5.1)
Alkaline phosphatase (APISO): 51 U/L (ref 33–115)
CO2: 27 mmol/L (ref 20–32)
Calcium: 9.4 mg/dL (ref 8.6–10.2)
Chloride: 105 mmol/L (ref 98–110)
Globulin: 2.8 g/dL (calc) (ref 1.9–3.7)
Potassium: 3.9 mmol/L (ref 3.5–5.3)
Sodium: 140 mmol/L (ref 135–146)
Total Bilirubin: 0.3 mg/dL (ref 0.2–1.2)
Total Protein: 6.8 g/dL (ref 6.1–8.1)

## 2018-01-09 LAB — RPR: RPR Ser Ql: NONREACTIVE

## 2018-01-09 LAB — HIV ANTIBODY (ROUTINE TESTING W REFLEX): HIV 1&2 Ab, 4th Generation: NONREACTIVE

## 2018-01-09 LAB — HEPATITIS C ANTIBODY
Hepatitis C Ab: NONREACTIVE
SIGNAL TO CUT-OFF: 0.02 (ref <1.00)

## 2018-01-09 LAB — HEPATITIS B SURFACE ANTIGEN: Hepatitis B Surface Ag: NONREACTIVE

## 2018-01-09 LAB — VITAMIN D 25 HYDROXY (VIT D DEFICIENCY, FRACTURES): Vit D, 25-Hydroxy: 25 ng/mL — ABNORMAL LOW (ref 30–100)

## 2018-01-09 LAB — TSH: TSH: 2.31 mIU/L

## 2018-01-10 LAB — PAP IG, CT-NG NAA, HPV HIGH-RISK
C. trachomatis RNA, TMA: NOT DETECTED
HPV DNA High Risk: NOT DETECTED
N. gonorrhoeae RNA, TMA: NOT DETECTED

## 2018-01-11 ENCOUNTER — Ambulatory Visit (INDEPENDENT_AMBULATORY_CARE_PROVIDER_SITE_OTHER): Payer: Medicare Other | Admitting: Podiatry

## 2018-01-11 DIAGNOSIS — M722 Plantar fascial fibromatosis: Secondary | ICD-10-CM

## 2018-01-11 NOTE — Progress Notes (Signed)
She presents today for follow-up of her bilateral plantar fasciitis.  Objective: She has tenderness on palpation medial calcaneal tubercles bilateral pulses remain palpable.  Assessment: Pain in limb secondary to plantar fasciitis.  Plan: After sterile Betadine skin prep I injected 10 mg Kenalog 5 mg Marcaine point maximal tenderness bilateral heels.  Tolerated procedure well without comp occasion.

## 2018-01-16 ENCOUNTER — Encounter: Payer: Self-pay | Admitting: Emergency Medicine

## 2018-01-16 ENCOUNTER — Emergency Department
Admission: EM | Admit: 2018-01-16 | Discharge: 2018-01-16 | Disposition: A | Discharge status: Home / Self Care | Payer: Medicare Other | Attending: Emergency Medicine | Admitting: Emergency Medicine

## 2018-01-16 ENCOUNTER — Emergency Department: Payer: Medicare Other

## 2018-01-16 ENCOUNTER — Other Ambulatory Visit: Payer: Self-pay

## 2018-01-16 DIAGNOSIS — R609 Edema, unspecified: Secondary | ICD-10-CM

## 2018-01-16 DIAGNOSIS — R2241 Localized swelling, mass and lump, right lower limb: Secondary | ICD-10-CM | POA: Diagnosis present

## 2018-01-16 DIAGNOSIS — Z79899 Other long term (current) drug therapy: Secondary | ICD-10-CM | POA: Diagnosis not present

## 2018-01-16 DIAGNOSIS — R600 Localized edema: Secondary | ICD-10-CM

## 2018-01-16 NOTE — ED Triage Notes (Signed)
Pt arrives ambulatory to triage with c/o bilateral leg swelling which is worse in the right leg. Pt has hx of clot in the right leg and just arrived back from [place]. Pt is in NAD.

## 2018-01-16 NOTE — ED Provider Notes (Signed)
[facility] Emergency Department Provider Note   ____________________________________________   First MD Initiated Contact with Patient 01/16/18 [phone]     (approximate)  I have reviewed the triage vital signs and the nursing notes.   HISTORY  Chief Complaint Leg Swelling    HPI Krystal Kelley is a 47 y.o. female patient reports she just got back from [place].  While she was down there she is been having more problems with her leg swelling.  She had a blood clot in the right leg sometime ago but afterwards the swelling is always gone down if she put her legs up.  Now the swelling is not going down all the way and both legs are swelling.  She is on 60 mg of Lasix already she says.  Breast cancer.  She had a CT of the abdomen done on the 12th of this month.  Additionally she had blood work done on the 12th on the 20th of this month which was essentially normal.  I offered her further evaluation but she has to get her daughter from school.  She has a follow-up planned with her doctor Dr. Hall Busing.  Past Medical History:  Diagnosis Date  . Anxiety   . Breast cancer, right breast ([place])   . Depression   . Peripheral edema    on lasix  . Radiation 10/30/14-12/17/14   right breast 50.4 gray    Patient Active Problem List   Diagnosis Date Noted  . Posttraumatic stress disorder 12/09/2016  . Leg edema, right 10/03/2014  . Genetic testing 07/10/2014  . Breast cancer of lower-inner quadrant of right female breast ([place]) 05/29/2014    Past Surgical History:  Procedure Laterality Date  . BREAST BIOPSY  05/16/14  . BREAST LUMPECTOMY Right 06/18/2014  . bunion surgrery    . CARPAL TUNNEL RELEASE Left 09/21/2016   Procedure: LEFT CARPAL TUNNEL RELEASE;  Surgeon: Daryll Brod, MD;  Location: [place];  Service: Orthopedics;  Laterality: Left;  REG/FAB  . carpel tunnel    . CHOLECYSTECTOMY  2015  . PLANTAR FASCIECTOMY    . PORTACATH PLACEMENT Left  06/18/2014   Procedure: INSERTION PORT-A-CATH;  Surgeon: Excell Seltzer, MD;  Location: [place];  Service: General;  Laterality: Left;    Prior to Admission medications   Medication Sig Start Date End Date Taking? Authorizing Provider  BELVIQ 10 MG TABS Take 10 mg by mouth 2 (two) times daily. 10/31/17   [name], MD  cycloSPORINE (RESTASIS) 0.05 % ophthalmic emulsion Place 1 drop into both eyes 2 (two) times daily.    [provider]  Eluxadoline (VIBERZI) 100 MG TABS Take 1 tablet (100 mg total) by mouth 2 (two) times daily. 01/18/17   Lucilla Lame, MD  escitalopram (LEXAPRO) 10 MG tablet Take 1 tablet (10 mg total) by mouth daily. 06/21/17 06/21/18  Aundra Dubin, MD  furosemide (LASIX) 20 MG tablet TAKE 3 TABLETS(60 MG) BY MOUTH DAILY 01/10/18   Nicholas Lose, MD  furosemide (LASIX) 40 MG tablet TAKE ONE TABLET BY MOUTH EVERY DAY AS NEEDED 09/26/17   Nicholas Lose, MD  tamoxifen (NOLVADEX) 20 MG tablet Take 1 tablet (20 mg total) by mouth daily. 09/08/17   Nicholas Lose, MD    Allergies Patient has no known allergies.  Family History  Problem Relation Age of Onset  . Kidney cancer Father        kidney  . Prostate cancer Maternal Uncle  deceased 35  . Lung cancer Maternal Grandfather        deceased 66; smoker  . Colon cancer Paternal Grandfather        deceased 33s  . Cancer Other        pat half-sister; ? uterine vs. ovarian ca    Social History Social History   Tobacco Use  . Smoking status: Never Smoker  . Smokeless tobacco: Never Used  Substance Use Topics  . Alcohol use: Yes    Comment: wine- occ   . Drug use: No    Review of Systems  Constitutional: No fever/chills Eyes: No visual changes. ENT: No sore throat. Cardiovascular: Denies chest pain. Respiratory: Denies shortness of breath. Gastrointestinal: No abdominal pain.  No nausea, no vomiting.  No diarrhea.  No constipation. Genitourinary: Negative for  dysuria. Musculoskeletal: Negative for back pain. Skin: Negative for rash. Neurological: Negative for headaches, focal weakness or numbness.   ____________________________________________   PHYSICAL EXAM:  VITAL SIGNS: ED Triage Vitals  Enc Vitals Group     BP 01/16/18 0522 (!) 158/86     Pulse Rate 01/16/18 0522 72     Resp 01/16/18 0522 20     Temp 01/16/18 0522 98.5 F (36.9 C)     Temp Source 01/16/18 0522 Oral     SpO2 01/16/18 0522 98 %     Weight 01/16/18 0523 260 lb (117.9 kg)     Height 01/16/18 0523 5\' 5"  (1.651 m)     Head Circumference --      Peak Flow --      Pain Score 01/16/18 0523 0     Pain Loc --      Pain Edu? --      Excl. in [place]? --     Constitutional: Alert and oriented. Well appearing and in no acute distress. Eyes: Conjunctivae are normal. Head: Atraumatic. Nose: No congestion/rhinnorhea. Mouth/Throat: Mucous membranes are moist.  Oropharynx non-erythematous. Neck: No stridor. Cardiovascular: Normal rate, regular rhythm. Grossly normal heart sounds.  Good peripheral circulation. Respiratory: Normal respiratory effort.  No retractions. Lungs CTAB. Gastrointestinal: Soft and nontender. No distention. No abdominal bruits.  Musculoskeletal: No lower extremity tenderness bilateral 1+ edema worse on the right Neurologic:  Normal speech and language. No gross focal neurologic deficits are appreciated.  Skin:  Skin is warm, dry and intact. No rash noted. Psychiatric: Mood and affect are normal. Speech and behavior are normal.  ____________________________________________   LABS (all labs ordered are listed, but only abnormal results are displayed)  Labs Reviewed - No data to display ____________________________________________  EKG  ____________________________________________  RADIOLOGY  ED MD interpretation: Ultrasound of the entire right leg and the left femoral vein read is negative  Official radiology report(s): US Venous Img Lower  Unilateral Right  Result Date: 01/16/2018 CLINICAL DATA:  Lower extremity edema EXAM: RIGHT LOWER EXTREMITY VENOUS DUPLEX ULTRASOUND TECHNIQUE: Gray-scale sonography with graded compression, as well as color Doppler and duplex ultrasound were performed to evaluate the right lower extremity deep venous system from the level of the common femoral vein and including the common femoral, femoral, profunda femoral, popliteal and calf veins including the posterior tibial, peroneal and gastrocnemius veins when visible. The superficial great saphenous vein was also interrogated. Spectral Doppler was utilized to evaluate flow at rest and with distal augmentation maneuvers in the common femoral, femoral and popliteal veins. COMPARISON:  None. FINDINGS: Contralateral Common Femoral Vein: Respiratory phasicity is normal and symmetric with the symptomatic side. No evidence of  thrombus. Normal compressibility. Common Femoral Vein: No evidence of thrombus. Normal compressibility, respiratory phasicity and response to augmentation. Saphenofemoral Junction: No evidence of thrombus. Normal compressibility and flow on color Doppler imaging. Profunda Femoral Vein: No evidence of thrombus. Normal compressibility and flow on color Doppler imaging. Femoral Vein: No evidence of thrombus. Normal compressibility, respiratory phasicity and response to augmentation. Popliteal Vein: No evidence of thrombus. Normal compressibility, respiratory phasicity and response to augmentation. Calf Veins: No evidence of thrombus. Normal compressibility and flow on color Doppler imaging. Superficial Great Saphenous Vein: No evidence of thrombus. Normal compressibility. Venous Reflux:  None. Other Findings:  None. IMPRESSION: No evidence of deep venous thrombosis in the right lower extremity. Left common femoral vein also patent. Electronically Signed   By: Lowella Grip III M.D.   On: 01/16/2018 06:59     ____________________________________________   PROCEDURES  Procedure(s) performed:   Procedures  Critical Care performed:   ____________________________________________   INITIAL IMPRESSION / ASSESSMENT AND PLAN / ED COURSE  Patient needs to go get her daughter from school.  She will continue follow-up with Dr. Hall Busing for further evaluation of her leg edema.  In her discharge instructions I mention for her to return here if it continues in a week for repeat ultrasound.         ____________________________________________   FINAL CLINICAL IMPRESSION(S) / ED DIAGNOSES  Final diagnoses:  Peripheral edema     ED Discharge Orders    None       Note:  This document was prepared using Dragon voice recognition software and may include unintentional dictation errors.    Nena Polio, MD 01/16/18 [phone]

## 2018-01-16 NOTE — Discharge Instructions (Addendum)
Please keep your legs up as much as possible as we discussed.  Please follow-up with Dr. Hall Busing as you have planned.  Please return here for any further problems.  The ultrasound showed no blood clot today.  Legs keep swelling there does not appear to be any other cause you could have another ultrasound done in a week.  Sometimes things will change in a blood clot that was not seen the first time will show up a week later.  This could also be just the aftereffects of the blood clot.  Sometimes when you had a blood clot you will get swelling in the legs afterwards.  The swelling can be hard to manage.  Sometimes you need compression stockings.  Dr. Hall Busing will help you manage all of this.

## 2018-01-16 NOTE — ED Notes (Signed)
Pt returned from US

## 2018-01-16 NOTE — ED Notes (Signed)
Pt to ultrasound

## 2018-02-13 ENCOUNTER — Ambulatory Visit: Payer: Medicare Other | Admitting: Obstetrics & Gynecology

## 2018-02-13 ENCOUNTER — Other Ambulatory Visit: Payer: Medicare Other

## 2018-02-20 ENCOUNTER — Telehealth: Payer: Self-pay | Admitting: *Deleted

## 2018-02-20 NOTE — Telephone Encounter (Signed)
Patient called history breast cancer on tamoxifen 20 mg, started having spotting last week and now flow off/on. Spotting today, flow is not heavy, normally only needs to wear 1 pad if needed. She scheduled for ultrasound on Thursday (02/23/18), okay to wait until this visit to follow up? Patient verbalized she does not want to take any hormones due to breast cancer.  Please advise

## 2018-02-20 NOTE — Telephone Encounter (Signed)
Yes, keep 11/7th appointment.  Will start with Pelvic US and do an EBx right after.

## 2018-02-20 NOTE — Telephone Encounter (Signed)
Patient informed. 

## 2018-02-23 ENCOUNTER — Other Ambulatory Visit: Payer: Self-pay | Admitting: Obstetrics & Gynecology

## 2018-02-23 ENCOUNTER — Encounter: Payer: Self-pay | Admitting: Obstetrics & Gynecology

## 2018-02-23 ENCOUNTER — Ambulatory Visit (INDEPENDENT_AMBULATORY_CARE_PROVIDER_SITE_OTHER): Payer: Medicare Other | Admitting: Obstetrics & Gynecology

## 2018-02-23 ENCOUNTER — Ambulatory Visit (INDEPENDENT_AMBULATORY_CARE_PROVIDER_SITE_OTHER): Payer: Medicare Other

## 2018-02-23 VITALS — BP 148/80

## 2018-02-23 DIAGNOSIS — D219 Benign neoplasm of connective and other soft tissue, unspecified: Secondary | ICD-10-CM

## 2018-02-23 DIAGNOSIS — D251 Intramural leiomyoma of uterus: Secondary | ICD-10-CM

## 2018-02-23 DIAGNOSIS — R9389 Abnormal findings on diagnostic imaging of other specified body structures: Secondary | ICD-10-CM | POA: Diagnosis not present

## 2018-02-23 DIAGNOSIS — C50311 Malignant neoplasm of lower-inner quadrant of right female breast: Secondary | ICD-10-CM

## 2018-02-23 DIAGNOSIS — N921 Excessive and frequent menstruation with irregular cycle: Secondary | ICD-10-CM

## 2018-02-23 DIAGNOSIS — N83202 Unspecified ovarian cyst, left side: Secondary | ICD-10-CM

## 2018-02-23 DIAGNOSIS — Z170 Estrogen receptor positive status [ER+]: Secondary | ICD-10-CM

## 2018-02-23 DIAGNOSIS — N852 Hypertrophy of uterus: Secondary | ICD-10-CM

## 2018-02-23 NOTE — Progress Notes (Signed)
    Krystal Kelley 1970/09/15 [id number]        47 y.o.  G0 Single  RP: Large Fibroids/Vaginal bleeding on Tamoxifen for Pelvic US  HPI: H/O Rt Breast Ca ER positive, Lumpectomy 06/2014, then Radiation Therapy.  On Tamoxifen currently.  Started having vaginal bleeding in October 2019.  Pelvic pressure, discomfort especially when bleeding.     OB History  Gravida Para Term Preterm AB Living  0 0 0 0 0 0  SAB TAB Ectopic Multiple Live Births  0 0 0 0 0    Past medical history,surgical history, problem list, medications, allergies, family history and social history were all reviewed and documented in the EPIC chart.   Directed ROS with pertinent positives and negatives documented in the history of present illness/assessment and plan.  Exam:  Vitals:   02/23/18 1123  BP: (!) 148/80   General appearance:  Normal  Pelvic US today: T/V and T/a images.  Uterus retroverted and enlarged measuring 13.36 x 11.8 x 8.56 cm.  Intramural fibroids measuring 2.5 x 1.8 cm, 2.1 x 1.9 cm, 4.8 x 4.2 cm, 6.6 x 5.2 cm, 7.9 x 6.0 cm.  Endometrium is thickened at 12.3 mm on tamoxifen and displaced by the fibroids.  Right ovary appears normal, but limited image.  Left ovary with a thin-walled echo-free cyst measuring 3.6 x 2.9 x 2.7 cm.  No free fluid in the posterior cul-de-sac.  Abdomen: Normal  Gynecologic exam: Vulva normal.  Speculum:  Cervix normal.  Vagina normal.    Endometrial Biopsy:  Verbal consent obtained.  Speculum inserted in the vagina.  Betadine prep done on the cervix.  Hurricaine spray on the cervix.  Anterior lip of the cervix grasped with a tenaculum.  Os finder to slightly dilate the cervix.  Endometrial biopsy cannula inserted easily in the intrauterine cavity.  Endometrial biopsy done with suction on all surfaces.  Cannula removed.  Specimen sent to pathology.  Tenaculum removed from the cervix.  Speculum removed from the vagina.  Patient tolerated the procedure well.  No  complication.   Assessment/Plan:  47 y.o. G0  1. Fibroids Overall enlarged uterus with longest diameter at 13.36 cm with many intramural fibroids the largest measure at 7.9 cm.  Long-standing large fibroid uterus with pain, discomfort and abnormal bleeding.  Decision to proceed with a robotic total laparoscopic hysterectomy with bilateral salpingo-oophorectomy.  The bilateral salpingo-oophorectomy was decided based on the history of breast cancer estrogen receptor positive.  Surgery, risks and benefits reviewed with patient.  Will organize surgery and follow-up for a preop visit.  Pamphlet given on robotic hysterectomy.  Pending endometrial biopsy.  2. Metrorrhagia Started having vaginal bleeding again on tamoxifen.  Thickened endometrium on ultrasound today measured at 12.3 mm.   3. Thickened endometrium Thickened endometrium at 12.3 mm by ultrasound today on tamoxifen.  Endometrial biopsy done.  Pending results.  4. Malignant neoplasm of lower-inner quadrant of right breast of female, estrogen receptor positive ([place]) History of right breast cancer estrogen receptor positive with history of lumpectomy in 2016 followed by radiation therapy.  Currently on tamoxifen.  Prophylaxis with bilateral salpingo-oophorectomy decided at the time of the hysterectomy.  Other orders - Pathology Report  Counseling on above issues and coordination of care more than 50% for 25 minutes.  [name] MD, 11:31 AM 02/23/2018

## 2018-02-24 ENCOUNTER — Telehealth: Payer: Self-pay

## 2018-02-24 ENCOUNTER — Encounter: Payer: Self-pay | Admitting: Obstetrics & Gynecology

## 2018-02-24 NOTE — Patient Instructions (Signed)
1. Fibroids Overall enlarged uterus with longest diameter at 13.36 cm with many intramural fibroids the largest measure at 7.9 cm.  Long-standing large fibroid uterus with pain, discomfort and abnormal bleeding.  Decision to proceed with a robotic total laparoscopic hysterectomy with bilateral salpingo-oophorectomy.  The bilateral salpingo-oophorectomy was decided based on the history of breast cancer estrogen receptor positive.  Surgery, risks and benefits reviewed with patient.  Will organize surgery and follow-up for a preop visit.  Pamphlet given on robotic hysterectomy.  Pending endometrial biopsy.  2. Metrorrhagia Started having vaginal bleeding again on tamoxifen.  Thickened endometrium on ultrasound today measured at 12.3 mm.   3. Thickened endometrium Thickened endometrium at 12.3 mm by ultrasound today on tamoxifen.  Endometrial biopsy done.  Pending results.  4. Malignant neoplasm of lower-inner quadrant of right breast of female, estrogen receptor positive ([place]) History of right breast cancer estrogen receptor positive with history of lumpectomy in 2016 followed by radiation therapy.  Currently on tamoxifen.  Prophylaxis with bilateral salpingo-oophorectomy decided at the time of the hysterectomy.  Other orders - Pathology Report  Tamar, it was a pleasure seeing you today!  I will inform you of your Endometrial Biopsy results as soon as it is available.

## 2018-02-24 NOTE — Telephone Encounter (Signed)
I called patient to let her know I received Dr. Mariah Milling surgery order for Robotic TLH,BSO.  I let her know that I am waiting on February schedule and it will be Feb before I can schedule her. She understood. I will call her if cancellation becomes available otherwise will call her as soon as I receive the Feb schedule.

## 2018-02-27 LAB — TISSUE SPECIMEN

## 2018-02-27 LAB — PATHOLOGY

## 2018-03-20 ENCOUNTER — Encounter: Payer: Self-pay | Admitting: Obstetrics & Gynecology

## 2018-03-20 ENCOUNTER — Ambulatory Visit (INDEPENDENT_AMBULATORY_CARE_PROVIDER_SITE_OTHER): Payer: Medicare Other | Admitting: Obstetrics & Gynecology

## 2018-03-20 VITALS — BP 128/88

## 2018-03-20 DIAGNOSIS — N921 Excessive and frequent menstruation with irregular cycle: Secondary | ICD-10-CM

## 2018-03-20 DIAGNOSIS — Z170 Estrogen receptor positive status [ER+]: Secondary | ICD-10-CM

## 2018-03-20 DIAGNOSIS — D219 Benign neoplasm of connective and other soft tissue, unspecified: Secondary | ICD-10-CM

## 2018-03-20 DIAGNOSIS — Z853 Personal history of malignant neoplasm of breast: Secondary | ICD-10-CM | POA: Diagnosis not present

## 2018-03-20 DIAGNOSIS — C50311 Malignant neoplasm of lower-inner quadrant of right female breast: Secondary | ICD-10-CM

## 2018-03-20 DIAGNOSIS — Z23 Encounter for immunization: Secondary | ICD-10-CM

## 2018-03-20 NOTE — Patient Instructions (Signed)
1. Fibroids Continued bleeding on tamoxifen for estrogen receptor positive right breast cancer.  Uterine fibroids and thickened endometrial line.  Endometrial biopsy done on February 23, 2018 was benign.  Decision to proceed with X I robotic total laparoscopic hysterectomy with bilateral salpingo-oophorectomy.  Surgery and risks thoroughly reviewed with patient.  Patient agrees with plan.  2. Metrorrhagia As above.  3. Malignant neoplasm of lower-inner quadrant of right breast of female, estrogen receptor positive ([place]) On tamoxifen.  4. Needs flu shot Flu shot given.                        Patient was counseled as to the risk of surgery to include the following:  1. Infection (prohylactic antibiotics will be administered)  2. DVT/Pulmonary Embolism (prophylactic pneumo compression stockings will be used)  3.Trauma to internal organs requiring additional surgical procedure to repair any injury to internal organs requiring perhaps additional hospitalization days.  4.Hemmorhage requiring transfusion and blood products which carry risks such as anaphylactic reaction, hepatitis and AIDS  Patient had received literature information on the procedure scheduled and all her questions were answered and fully accepts all risk.  [name], good seeing you today!

## 2018-03-20 NOTE — Addendum Note (Signed)
Addended by: Thurnell Garbe A on: 03/20/2018 12:40 PM   Modules accepted: Orders

## 2018-03-20 NOTE — Progress Notes (Signed)
    Krystal Kelley 02-14-1971 [id number]        47 y.o.  G0 Single.  On medical disability.  RP: Preop XI Robotic TLH/BSO  HPI: H/O Rt Breast Ca ER positive, Lumpectomy 06/2014, then Radiation Therapy.  On Tamoxifen currently.  Started having vaginal bleeding in October 2019.  Pelvic pressure, discomfort especially when bleeding.      OB History  Gravida Para Term Preterm AB Living  0 0 0 0 0 0  SAB TAB Ectopic Multiple Live Births  0 0 0 0 0    Past medical history,surgical history, problem list, medications, allergies, family history and social history were all reviewed and documented in the EPIC chart.   Directed ROS with pertinent positives and negatives documented in the history of present illness/assessment and plan.  Exam:  Vitals:   03/20/18 1037  BP: 128/88   General appearance:  Normal  Gyn exam deferred  Pelvic US 02/23/2018: T/V and T/a images. Uterus retroverted and enlarged measuring 13.36 x 11.8 x 8.56 cm. Intramural fibroids measuring 2.5 x 1.8 cm, 2.1 x 1.9 cm, 4.8 x 4.2 cm, 6.6 x 5.2 cm, 7.9 x 6.0 cm. Endometrium is thickened at 12.3 mm on tamoxifen and displaced by the fibroids. Right ovary appears normal, but limited image. Left ovary with a thin-walled echo-free cyst measuring 3.6 x 2.9 x 2.7 cm. No free fluid in the posterior cul-de-sac.  Endometrial Biopsy 02/23/2018: Benign   Assessment/Plan:  47 y.o. G0  1. Fibroids Continued bleeding on tamoxifen for estrogen receptor positive right breast cancer.  Uterine fibroids and thickened endometrial line.  Endometrial biopsy done on February 23, 2018 was benign.  Decision to proceed with X I robotic total laparoscopic hysterectomy with bilateral salpingo-oophorectomy.  Surgery and risks thoroughly reviewed with patient.  Patient agrees with plan.  2. Metrorrhagia As above.  3. Malignant neoplasm of lower-inner quadrant of right breast of female, estrogen receptor positive ([place]) On tamoxifen.  4.  Needs flu shot Flu shot given.                        Patient was counseled as to the risk of surgery to include the following:  1. Infection (prohylactic antibiotics will be administered)  2. DVT/Pulmonary Embolism (prophylactic pneumo compression stockings will be used)  3.Trauma to internal organs requiring additional surgical procedure to repair any injury to internal organs requiring perhaps additional hospitalization days.  4.Hemmorhage requiring transfusion and blood products which carry risks such as anaphylactic reaction, hepatitis and AIDS  Patient had received literature information on the procedure scheduled and all her questions were answered and fully accepts all risk.  Counseling on above issues and coordination of care more than 50% for 25 minutes.  [name] MD, 10:55 AM 03/20/2018

## 2018-03-21 ENCOUNTER — Telehealth: Payer: Self-pay | Admitting: *Deleted

## 2018-03-21 ENCOUNTER — Telehealth: Payer: Self-pay

## 2018-03-21 NOTE — Telephone Encounter (Signed)
Patient called c/o cramping cycle started today requesting Rx Toradol for cramps stated this helps. Takes tamoxifen history of breast cancer. Please advise

## 2018-03-21 NOTE — Telephone Encounter (Signed)
Okay only see Toradol 10 mg tablet how many tablets?

## 2018-03-21 NOTE — Telephone Encounter (Signed)
Called pt back to return her vm. Pt reports that she had been on tamoxifen x3 yrs and have recently started her period again. Pt went to her GYN dr and was examined. Pt was told that it was best for her to get a total hysterectomy due to her cancer risk and fibroid growth. Pt wants to know if she can permanently stop tamoxifen after the surgery. Told pt that she should stop tamoxifen up to 1 week before surgery to decrease risk of clot formation during surgery, and to call our office for an appt with Dr.Gudena. She will need to discuss about possibly switching her tamoxifen to AI. MD can go over benefits and risk of this vs tamoxifen. Pt verbalized understanding and will call as soon as she has completed hysterectomy. No further needs at this time.

## 2018-03-21 NOTE — Telephone Encounter (Signed)
Can prescribe Toradol, but same family as Ibuprofen if ok with that OTC.

## 2018-03-24 MED ORDER — KETOROLAC TROMETHAMINE 10 MG PO TABS: 10.0000 mg | ORAL_TABLET | Freq: Three times a day (TID) | ORAL | 2 refills | Status: DC

## 2018-03-24 NOTE — Telephone Encounter (Signed)
Rx sent 

## 2018-03-24 NOTE — Telephone Encounter (Signed)
Toradol 10 mg tab every 8 hours PRN.  #30, refill x 2.

## 2018-03-27 ENCOUNTER — Telehealth: Payer: Self-pay

## 2018-03-27 NOTE — Telephone Encounter (Signed)
I called patient and let her know surgery schedule is ready for Feb. Scheduled her for 05/22/18 at 7:30am at [facility].  She has two insurances and states she will still have them with new year. I will be checking benefits with new year.  Pamphlet mailed to patient with date/time.

## 2018-03-28 ENCOUNTER — Telehealth: Payer: Self-pay | Admitting: *Deleted

## 2018-03-28 NOTE — Telephone Encounter (Signed)
Prior authorization for Toradol 10 mg tablet denied by [employer] mediation is not listed on formulary.

## 2018-03-31 ENCOUNTER — Telehealth: Payer: Self-pay

## 2018-03-31 NOTE — Telephone Encounter (Signed)
I called patient to let her know that Medicaid requires that she sign a hysterectomy statement form before 30 days prior to surgery. We planned that she will come by on Monday and ask for me and we will get it filled out together.

## 2018-04-05 ENCOUNTER — Encounter: Payer: Self-pay | Admitting: Anesthesiology

## 2018-04-20 ENCOUNTER — Telehealth: Payer: Self-pay

## 2018-04-20 NOTE — Telephone Encounter (Signed)
Possible that she could work on a sedentary job, but tell her that everybody recovers a little differently, so no guaranty.  Probably better to plan for 2 weeks out.Marland KitchenMarland Kitchen

## 2018-04-20 NOTE — Telephone Encounter (Addendum)
Scheduled for Robotic TLH, BSO.  Patient called inquiring if she gets a new job and it is sedentary what is the least time out of work she can safely expect?    (She was asking if she could go back to work in a week.)

## 2018-04-21 NOTE — Telephone Encounter (Signed)
Voice mail box is full and I cannot leave a message.

## 2018-04-26 NOTE — Telephone Encounter (Signed)
Spoke with patient and informed her. °

## 2018-05-08 ENCOUNTER — Encounter: Payer: Self-pay | Admitting: Podiatry

## 2018-05-08 ENCOUNTER — Ambulatory Visit (INDEPENDENT_AMBULATORY_CARE_PROVIDER_SITE_OTHER): Payer: Medicare Other | Admitting: Podiatry

## 2018-05-08 DIAGNOSIS — M722 Plantar fascial fibromatosis: Secondary | ICD-10-CM

## 2018-05-08 NOTE — Progress Notes (Signed)
She presents today chief complaint of bilateral heel pain.  Objective: Pulses are palpable.  She still has pain on palpation medial cuticles bilateral.  Assessment: Plantar fasciitis bilateral.  Plan: Extra Betadine skin prep injected bilateral heels today 20 mg Kenalog 5 mg Marcaine point maximal tenderness.  Tolerated procedure well follow-up with her as needed.

## 2018-05-12 NOTE — Patient Instructions (Addendum)
Krystal Kelley  05/12/2018      Your procedure is scheduled on 05-22-18    Report to [facility]  at 5:30 A.M.  Call this number if you have problems the morning of surgery: [place] WITH [facility].   Remember: Do not eat food or drink liquids after midnight.NO SOLID FOOD AFTER MIDNIGHT THE NIGHT PRIOR TO SURGERY. NOTHING BY MOUTH EXCEPT CLEAR LIQUIDS UNTIL 3 HOURS PRIOR TO [place] SURGERY. PLEASE FINISH ENSURE DRINK PER SURGEON ORDER 3 HOURS PRIOR TO SCHEDULED SURGERY TIME WHICH NEEDS TO BE COMPLETED AT 4:30 AM.   CLEAR LIQUID DIET   Foods Allowed                                                                     Foods Excluded  Coffee and tea, regular and decaf                             liquids that you cannot  Plain Jell-O in any flavor                                             see through such as: Fruit ices (not with fruit pulp)                                     milk, soups, orange juice  Iced Popsicles                                    All solid food Carbonated beverages, regular and diet                                    Cranberry, grape and apple juices Sports drinks like Gatorade Lightly seasoned clear broth or consume(fat free) Sugar, honey syrup  Sample Menu Breakfast                                Lunch                                     Supper Cranberry juice                    Beef broth                            Chicken broth Jell-O                                     Grape juice  Apple juice Coffee or tea                        Jell-O                                      Popsicle                                                Coffee or tea                        Coffee or tea  _____________________________________________________________________    Take these medicines the morning of surgery with A SIP OF WATER: None. You  may use and bring your eyedrops as need   Do not wear jewelry, make-up or nail polish.  Do not wear lotions, powders, or perfumes, or deoderant.  Do not shave 48 hours prior to surgery.    Do not bring valuables to the hospital.  [facility] is not responsible for any belongings or valuables.  Contacts, dentures or bridgework may not be worn into surgery.  Leave your suitcase in the car.  After surgery it may be brought to your room.  For patients admitted to the hospital, discharge time will be determined by your treatment team.  Patients discharged the day of surgery will not be allowed to drive home.   Special instructions:  Bring your medication in their original pill bottles  Please read over the following fact sheets that you were given:       [facility] - Preparing for Surgery Before surgery, you can play an important role.  Because skin is not sterile, your skin needs to be as free of germs as possible.  You can reduce the number of germs on your skin by washing with CHG (chlorahexidine gluconate) soap before surgery.  CHG is an antiseptic cleaner which kills germs and bonds with the skin to continue killing germs even after washing. Please DO NOT use if you have an allergy to CHG or antibacterial soaps.  If your skin becomes reddened/irritated stop using the CHG and inform your nurse when you arrive at Short Stay. Do not shave (including legs and underarms) for at least 48 hours prior to the first CHG shower.  You may shave your face/neck. Please follow these instructions carefully:  1.  Shower with CHG Soap the night before surgery and the  morning of Surgery.  2.  If you choose to wash your hair, wash your hair first as usual with your  normal  shampoo.  3.  After you shampoo, rinse your hair and body thoroughly to remove the  shampoo.                           4.  Use CHG as you would any other liquid soap.  You can apply chg directly  to the skin and wash                        Gently with a scrungie or clean washcloth.  5.  Apply the CHG Soap to your body ONLY FROM THE NECK DOWN.   Do not use  on face/ open                           Wound or open sores. Avoid contact with eyes, ears mouth and genitals (private parts).                       Wash face,  Genitals (private parts) with your normal soap.             6.  Wash thoroughly, paying special attention to the area where your surgery  will be performed.  7.  Thoroughly rinse your body with warm water from the neck down.  8.  DO NOT shower/wash with your normal soap after using and rinsing off  the CHG Soap.                9.  Pat yourself dry with a clean towel.            10.  Wear clean pajamas.            11.  Place clean sheets on your bed the night of your first shower and do not  sleep with pets. Day of Surgery : Do not apply any lotions/deodorants the morning of surgery.  Please wear clean clothes to the hospital/surgery center.  FAILURE TO FOLLOW THESE INSTRUCTIONS MAY RESULT IN THE CANCELLATION OF YOUR SURGERY PATIENT SIGNATURE_________________________________  NURSE SIGNATURE__________________________________  ________________________________________________________________________   Adam Phenix  An incentive spirometer is a tool that can help keep your lungs clear and active. This tool measures how well you are filling your lungs with each breath. Taking long deep breaths may help reverse or decrease the chance of developing breathing (pulmonary) problems (especially infection) following:  A long period of time when you are unable to move or be active. BEFORE THE PROCEDURE   If the spirometer includes an indicator to show your best effort, your nurse or respiratory therapist will set it to a desired goal.  If possible, sit up straight or lean slightly forward. Try not to slouch.  Hold the incentive spirometer in an upright position. INSTRUCTIONS FOR USE  1. Sit on the edge of your  bed if possible, or sit up as far as you can in bed or on a chair. 2. Hold the incentive spirometer in an upright position. 3. Breathe out normally. 4. Place the mouthpiece in your mouth and seal your lips tightly around it. 5. Breathe in slowly and as deeply as possible, raising the piston or the ball toward the top of the column. 6. Hold your breath for 3-5 seconds or for as long as possible. Allow the piston or ball to fall to the bottom of the column. 7. Remove the mouthpiece from your mouth and breathe out normally. 8. Rest for a few seconds and repeat Steps 1 through 7 at least 10 times every 1-2 hours when you are awake. Take your time and take a few normal breaths between deep breaths. 9. The spirometer may include an indicator to show your best effort. Use the indicator as a goal to work toward during each repetition. 10. After each set of 10 deep breaths, practice coughing to be sure your lungs are clear. If you have an incision (the cut made at the time of surgery), support your incision when coughing by placing a pillow or rolled up towels firmly against it. Once you are able to get out of bed, walk around  indoors and cough well. You may stop using the incentive spirometer when instructed by your caregiver.  RISKS AND COMPLICATIONS  Take your time so you do not get dizzy or light-headed.  If you are in pain, you may need to take or ask for pain medication before doing incentive spirometry. It is harder to take a deep breath if you are having pain. AFTER USE  Rest and breathe slowly and easily.  It can be helpful to keep track of a log of your progress. Your caregiver can provide you with a simple table to help with this. If you are using the spirometer at home, follow these instructions: [place] IF:   You are having difficultly using the spirometer.  You have trouble using the spirometer as often as instructed.  Your pain medication is not giving enough relief while  using the spirometer.  You develop fever of 100.5 F (38.1 C) or higher. SEEK IMMEDIATE MEDICAL CARE IF:   You cough up bloody sputum that had not been present before.  You develop fever of 102 F (38.9 C) or greater.  You develop worsening pain at or near the incision site. MAKE SURE YOU:   Understand these instructions.  Will watch your condition.  Will get help right away if you are not doing well or get worse. Document Released: 08/16/2006 Document Revised: 06/28/2011 Document Reviewed: 10/17/2006 Exit[employer] Patient Information 2014 [employer], [place].   ________________________________________________________________________  WHAT IS A BLOOD TRANSFUSION? Blood Transfusion Information  A transfusion is the replacement of blood or some of its parts. Blood is made up of multiple cells which provide different functions.  Red blood cells carry oxygen and are used for blood loss replacement.  White blood cells fight against infection.  Platelets control bleeding.  Plasma helps clot blood.  Other blood products are available for specialized needs, such as hemophilia or other clotting disorders. BEFORE THE TRANSFUSION  Who gives blood for transfusions?   Healthy volunteers who are fully evaluated to make sure their blood is safe. This is blood bank blood. Transfusion therapy is the safest it has ever been in the practice of medicine. Before blood is taken from a donor, a complete history is taken to make sure that person has no history of diseases nor engages in risky social behavior (examples are intravenous drug use or sexual activity with multiple partners). The donor's travel history is screened to minimize risk of transmitting infections, such as malaria. The donated blood is tested for signs of infectious diseases, such as HIV and hepatitis. The blood is then tested to be sure it is compatible with you in order to minimize the chance of a transfusion reaction. If you or a  relative donates blood, this is often done in anticipation of surgery and is not appropriate for emergency situations. It takes many days to process the donated blood. RISKS AND COMPLICATIONS Although transfusion therapy is very safe and saves many lives, the main dangers of transfusion include:   Getting an infectious disease.  Developing a transfusion reaction. This is an allergic reaction to something in the blood you were given. Every precaution is taken to prevent this. The decision to have a blood transfusion has been considered carefully by your caregiver before blood is given. Blood is not given unless the benefits outweigh the risks. AFTER THE TRANSFUSION  Right after receiving a blood transfusion, you will usually feel much better and more energetic. This is especially true if your red blood cells have gotten  low (anemic). The transfusion raises the level of the red blood cells which carry oxygen, and this usually causes an energy increase.  The nurse administering the transfusion will monitor you carefully for complications. HOME CARE INSTRUCTIONS  No special instructions are needed after a transfusion. You may find your energy is better. Speak with your caregiver about any limitations on activity for underlying diseases you may have. SEEK MEDICAL CARE IF:   Your condition is not improving after your transfusion.  You develop redness or irritation at the intravenous (IV) site. SEEK IMMEDIATE MEDICAL CARE IF:  Any of the following symptoms occur over the next 12 hours:  Shaking chills.  You have a temperature by mouth above 102 F (38.9 C), not controlled by medicine.  Chest, back, or muscle pain.  People around you feel you are not acting correctly or are confused.  Shortness of breath or difficulty breathing.  Dizziness and fainting.  You get a rash or develop hives.  You have a decrease in urine output.  Your urine turns a dark color or changes to pink, red, or  brown. Any of the following symptoms occur over the next 10 days:  You have a temperature by mouth above 102 F (38.9 C), not controlled by medicine.  Shortness of breath.  Weakness after normal activity.  The white part of the eye turns yellow (jaundice).  You have a decrease in the amount of urine or are urinating less often.  Your urine turns a dark color or changes to pink, red, or brown. Document Released: 04/02/2000 Document Revised: 06/28/2011 Document Reviewed: 11/20/2007 [facility] Patient Information 2014 [place], [place].  _______________________________________________________________________

## 2018-05-15 ENCOUNTER — Encounter (HOSPITAL_COMMUNITY): Payer: Self-pay | Admitting: *Deleted

## 2018-05-15 ENCOUNTER — Other Ambulatory Visit: Payer: Self-pay

## 2018-05-15 ENCOUNTER — Encounter (HOSPITAL_COMMUNITY)
Admission: RE | Admit: 2018-05-15 | Discharge: 2018-05-15 | Disposition: A | Discharge status: Home / Self Care | Payer: Medicare Other | Source: Ambulatory Visit | Attending: Obstetrics & Gynecology | Admitting: Obstetrics & Gynecology

## 2018-05-15 DIAGNOSIS — Z01818 Encounter for other preprocedural examination: Secondary | ICD-10-CM | POA: Diagnosis not present

## 2018-05-15 HISTORY — DX: Other specified postprocedural states: Z98.890

## 2018-05-15 LAB — CBC
HCT: 40.4 % (ref 36.0–46.0)
Hemoglobin: 13.1 g/dL (ref 12.0–15.0)
MCH: 31.7 pg (ref 26.0–34.0)
MCHC: 32.4 g/dL (ref 30.0–36.0)
MCV: 97.8 fL (ref 80.0–100.0)
Platelets: 297 10*3/uL (ref 150–400)
RBC: 4.13 MIL/uL (ref 3.87–5.11)
RDW: 13 % (ref 11.5–15.5)
WBC: 9.7 K/uL (ref 4.0–10.5)
nRBC: 0 % (ref 0.0–0.2)

## 2018-05-15 LAB — ABO/RH: ABO/RH(D): O POS

## 2018-05-22 ENCOUNTER — Ambulatory Visit (HOSPITAL_BASED_OUTPATIENT_CLINIC_OR_DEPARTMENT_OTHER): Payer: Medicare Other | Admitting: Anesthesiology

## 2018-05-22 ENCOUNTER — Ambulatory Visit (HOSPITAL_BASED_OUTPATIENT_CLINIC_OR_DEPARTMENT_OTHER): Payer: Medicare Other | Admitting: Physician Assistant

## 2018-05-22 ENCOUNTER — Ambulatory Visit (HOSPITAL_COMMUNITY)
Admission: RE | Admit: 2018-05-22 | Discharge: 2018-05-23 | Disposition: A | Discharge status: Home / Self Care | Payer: Medicare Other | Source: Ambulatory Visit | Attending: Obstetrics & Gynecology | Admitting: Obstetrics & Gynecology

## 2018-05-22 ENCOUNTER — Encounter (HOSPITAL_BASED_OUTPATIENT_CLINIC_OR_DEPARTMENT_OTHER): Payer: Self-pay | Admitting: Emergency Medicine

## 2018-05-22 ENCOUNTER — Encounter (HOSPITAL_BASED_OUTPATIENT_CLINIC_OR_DEPARTMENT_OTHER)
Admission: RE | Disposition: A | Discharge status: Home / Self Care | Payer: Self-pay | Source: Ambulatory Visit | Attending: Obstetrics & Gynecology

## 2018-05-22 DIAGNOSIS — Z79899 Other long term (current) drug therapy: Secondary | ICD-10-CM | POA: Diagnosis not present

## 2018-05-22 DIAGNOSIS — R102 Pelvic and perineal pain: Secondary | ICD-10-CM | POA: Insufficient documentation

## 2018-05-22 DIAGNOSIS — N802 Endometriosis of fallopian tube: Secondary | ICD-10-CM | POA: Diagnosis not present

## 2018-05-22 DIAGNOSIS — N83202 Unspecified ovarian cyst, left side: Secondary | ICD-10-CM | POA: Diagnosis not present

## 2018-05-22 DIAGNOSIS — D251 Intramural leiomyoma of uterus: Secondary | ICD-10-CM | POA: Insufficient documentation

## 2018-05-22 DIAGNOSIS — Z79810 Long term (current) use of selective estrogen receptor modulators (SERMs): Secondary | ICD-10-CM | POA: Insufficient documentation

## 2018-05-22 DIAGNOSIS — D219 Benign neoplasm of connective and other soft tissue, unspecified: Secondary | ICD-10-CM

## 2018-05-22 DIAGNOSIS — Z170 Estrogen receptor positive status [ER+]: Secondary | ICD-10-CM | POA: Diagnosis not present

## 2018-05-22 DIAGNOSIS — N921 Excessive and frequent menstruation with irregular cycle: Secondary | ICD-10-CM | POA: Insufficient documentation

## 2018-05-22 DIAGNOSIS — C50311 Malignant neoplasm of lower-inner quadrant of right female breast: Secondary | ICD-10-CM | POA: Diagnosis not present

## 2018-05-22 DIAGNOSIS — Z791 Long term (current) use of non-steroidal anti-inflammatories (NSAID): Secondary | ICD-10-CM | POA: Diagnosis not present

## 2018-05-22 DIAGNOSIS — N736 Female pelvic peritoneal adhesions (postinfective): Secondary | ICD-10-CM | POA: Insufficient documentation

## 2018-05-22 DIAGNOSIS — N888 Other specified noninflammatory disorders of cervix uteri: Secondary | ICD-10-CM | POA: Diagnosis not present

## 2018-05-22 DIAGNOSIS — D259 Leiomyoma of uterus, unspecified: Secondary | ICD-10-CM | POA: Diagnosis present

## 2018-05-22 DIAGNOSIS — N800 Endometriosis of uterus: Secondary | ICD-10-CM | POA: Diagnosis not present

## 2018-05-22 DIAGNOSIS — Z923 Personal history of irradiation: Secondary | ICD-10-CM | POA: Insufficient documentation

## 2018-05-22 DIAGNOSIS — Z9889 Other specified postprocedural states: Secondary | ICD-10-CM

## 2018-05-22 DIAGNOSIS — N83291 Other ovarian cyst, right side: Secondary | ICD-10-CM | POA: Insufficient documentation

## 2018-05-22 HISTORY — DX: Other specified postprocedural states: Z98.890

## 2018-05-22 HISTORY — DX: Nausea with vomiting, unspecified: R11.2

## 2018-05-22 HISTORY — PX: ROBOTIC ASSISTED TOTAL HYSTERECTOMY WITH BILATERAL SALPINGO OOPHERECTOMY: SHX6086

## 2018-05-22 HISTORY — PX: HYSTERECTOMY ABDOMINAL WITH SALPINGO-OOPHORECTOMY: SHX6792

## 2018-05-22 HISTORY — PX: ROBOTIC ASSISTED LAPAROSCOPIC LYSIS OF ADHESION: SHX6080

## 2018-05-22 LAB — TYPE AND SCREEN
ABO/RH(D): O POS
Antibody Screen: NEGATIVE

## 2018-05-22 LAB — POCT PREGNANCY, URINE: Preg Test, Ur: NEGATIVE

## 2018-05-22 SURGERY — HYSTERECTOMY, TOTAL, ROBOT-ASSISTED, LAPAROSCOPIC, WITH BILATERAL SALPINGO-OOPHORECTOMY
Anesthesia: General | Laterality: Bilateral

## 2018-05-22 MED ORDER — ROCURONIUM BROMIDE 10 MG/ML (PF) SYRINGE
PREFILLED_SYRINGE | INTRAVENOUS | Status: DC | PRN
  Administered 2018-05-22: 20 mg via INTRAVENOUS
  Administered 2018-05-22: 60 mg via INTRAVENOUS
  Administered 2018-05-22 (×2): 20 mg via INTRAVENOUS

## 2018-05-22 MED ORDER — ARTIFICIAL TEARS OPHTHALMIC OINT
TOPICAL_OINTMENT | OPHTHALMIC | Status: AC
  Filled 2018-05-22: qty 3.5

## 2018-05-22 MED ORDER — HYDROMORPHONE HCL 1 MG/ML IJ SOLN
INTRAMUSCULAR | Status: AC
  Filled 2018-05-22: qty 1

## 2018-05-22 MED ORDER — EPHEDRINE 5 MG/ML INJ
INTRAVENOUS | Status: AC
  Filled 2018-05-22: qty 10

## 2018-05-22 MED ORDER — ONDANSETRON HCL 4 MG/2ML IJ SOLN
INTRAMUSCULAR | Status: DC | PRN
  Administered 2018-05-22 (×2): 4 mg via INTRAVENOUS

## 2018-05-22 MED ORDER — LIDOCAINE 2% (20 MG/ML) 5 ML SYRINGE
INTRAMUSCULAR | Status: DC | PRN
  Administered 2018-05-22: 100 mg via INTRAVENOUS

## 2018-05-22 MED ORDER — ONDANSETRON HCL 4 MG/2ML IJ SOLN
INTRAMUSCULAR | Status: AC
  Filled 2018-05-22: qty 2

## 2018-05-22 MED ORDER — ALBUMIN HUMAN 5 % IV SOLN
INTRAVENOUS | Status: DC | PRN
  Administered 2018-05-22: 11:00:00 via INTRAVENOUS

## 2018-05-22 MED ORDER — FENTANYL CITRATE (PF) 100 MCG/2ML IJ SOLN
INTRAMUSCULAR | Status: DC | PRN
  Administered 2018-05-22: 100 ug via INTRAVENOUS
  Administered 2018-05-22 (×6): 50 ug via INTRAVENOUS

## 2018-05-22 MED ORDER — SUGAMMADEX SODIUM 500 MG/5ML IV SOLN
INTRAVENOUS | Status: DC | PRN
  Administered 2018-05-22: 50 mg via INTRAVENOUS
  Administered 2018-05-22: 250 mg via INTRAVENOUS

## 2018-05-22 MED ORDER — FENTANYL CITRATE (PF) 100 MCG/2ML IJ SOLN
25.0000 ug | INTRAMUSCULAR | Status: DC | PRN
  Administered 2018-05-22: 25 ug via INTRAVENOUS
  Administered 2018-05-22: 50 ug via INTRAVENOUS
  Administered 2018-05-22: 25 ug via INTRAVENOUS
  Filled 2018-05-22: qty 1

## 2018-05-22 MED ORDER — ROCURONIUM BROMIDE 100 MG/10ML IV SOLN
INTRAVENOUS | Status: AC
  Filled 2018-05-22: qty 1

## 2018-05-22 MED ORDER — VASOPRESSIN 20 UNIT/ML IV SOLN
INTRAVENOUS | Status: DC | PRN
  Administered 2018-05-22: 10 mL via INTRAMUSCULAR

## 2018-05-22 MED ORDER — FENTANYL CITRATE (PF) 250 MCG/5ML IJ SOLN
INTRAMUSCULAR | Status: AC
  Filled 2018-05-22: qty 5

## 2018-05-22 MED ORDER — ACETAMINOPHEN 325 MG PO TABS
650.0000 mg | ORAL_TABLET | ORAL | Status: DC | PRN
  Filled 2018-05-22: qty 2

## 2018-05-22 MED ORDER — ACETAMINOPHEN 10 MG/ML IV SOLN
INTRAVENOUS | Status: DC | PRN
  Administered 2018-05-22: 1000 mg via INTRAVENOUS

## 2018-05-22 MED ORDER — SODIUM CHLORIDE (PF) 0.9 % IJ SOLN
INTRAMUSCULAR | Status: AC
  Filled 2018-05-22: qty 10

## 2018-05-22 MED ORDER — TAMOXIFEN CITRATE 10 MG PO TABS
20.0000 mg | ORAL_TABLET | Freq: Every day | ORAL | Status: DC
  Administered 2018-05-22: 20 mg via ORAL
  Filled 2018-05-22 (×3): qty 2

## 2018-05-22 MED ORDER — PROPOFOL 10 MG/ML IV BOLUS
INTRAVENOUS | Status: DC | PRN
  Administered 2018-05-22: 20 mg via INTRAVENOUS
  Administered 2018-05-22: 180 mg via INTRAVENOUS

## 2018-05-22 MED ORDER — CEFAZOLIN SODIUM 1 G IJ SOLR
INTRAMUSCULAR | Status: AC
  Filled 2018-05-22: qty 20

## 2018-05-22 MED ORDER — HYDROMORPHONE HCL 1 MG/ML IJ SOLN
0.5000 mg | INTRAMUSCULAR | Status: DC | PRN
  Administered 2018-05-22 (×2): 0.5 mg via INTRAVENOUS
  Filled 2018-05-22: qty 0.5

## 2018-05-22 MED ORDER — CEFAZOLIN SODIUM-DEXTROSE 2-4 GM/100ML-% IV SOLN
2.0000 g | INTRAVENOUS | Status: AC
  Administered 2018-05-22 (×2): 2 g via INTRAVENOUS
  Filled 2018-05-22: qty 100

## 2018-05-22 MED ORDER — OXYCODONE HCL 5 MG PO TABS
5.0000 mg | ORAL_TABLET | ORAL | Status: DC | PRN
  Administered 2018-05-22: 5 mg via ORAL
  Administered 2018-05-23 (×2): 10 mg via ORAL
  Filled 2018-05-22: qty 2

## 2018-05-22 MED ORDER — CEFAZOLIN SODIUM-DEXTROSE 2-4 GM/100ML-% IV SOLN
INTRAVENOUS | Status: AC
  Filled 2018-05-22: qty 100

## 2018-05-22 MED ORDER — FENTANYL CITRATE (PF) 100 MCG/2ML IJ SOLN
INTRAMUSCULAR | Status: AC
  Filled 2018-05-22: qty 2

## 2018-05-22 MED ORDER — LIDOCAINE 2% (20 MG/ML) 5 ML SYRINGE
INTRAMUSCULAR | Status: AC
  Filled 2018-05-22: qty 5

## 2018-05-22 MED ORDER — ONDANSETRON HCL 4 MG PO TABS
4.0000 mg | ORAL_TABLET | Freq: Three times a day (TID) | ORAL | Status: DC | PRN
  Filled 2018-05-22: qty 1

## 2018-05-22 MED ORDER — SUGAMMADEX SODIUM 200 MG/2ML IV SOLN
INTRAVENOUS | Status: AC
  Filled 2018-05-22: qty 2

## 2018-05-22 MED ORDER — OXYCODONE HCL 5 MG PO TABS
ORAL_TABLET | ORAL | Status: AC
  Filled 2018-05-22: qty 1

## 2018-05-22 MED ORDER — ONDANSETRON HCL 4 MG/2ML IJ SOLN
4.0000 mg | Freq: Three times a day (TID) | INTRAMUSCULAR | Status: DC | PRN
  Filled 2018-05-22: qty 2

## 2018-05-22 MED ORDER — SUGAMMADEX SODIUM 500 MG/5ML IV SOLN
INTRAVENOUS | Status: AC
  Filled 2018-05-22: qty 5

## 2018-05-22 MED ORDER — DEXAMETHASONE SODIUM PHOSPHATE 10 MG/ML IJ SOLN
INTRAMUSCULAR | Status: DC | PRN
  Administered 2018-05-22: 10 mg via INTRAVENOUS

## 2018-05-22 MED ORDER — SCOPOLAMINE 1 MG/3DAYS TD PT72
MEDICATED_PATCH | TRANSDERMAL | Status: AC
  Filled 2018-05-22: qty 1

## 2018-05-22 MED ORDER — EPHEDRINE SULFATE-NACL 50-0.9 MG/10ML-% IV SOSY
PREFILLED_SYRINGE | INTRAVENOUS | Status: DC | PRN
  Administered 2018-05-22: 15 mg via INTRAVENOUS

## 2018-05-22 MED ORDER — PROPOFOL 10 MG/ML IV BOLUS
INTRAVENOUS | Status: AC
  Filled 2018-05-22: qty 40

## 2018-05-22 MED ORDER — BUPIVACAINE HCL (PF) 0.25 % IJ SOLN
INTRAMUSCULAR | Status: DC | PRN
  Administered 2018-05-22: 30 mL

## 2018-05-22 MED ORDER — FLUTICASONE PROPIONATE 50 MCG/ACT NA SUSP
1.0000 | Freq: Every day | NASAL | Status: DC | PRN
  Filled 2018-05-22: qty 16

## 2018-05-22 MED ORDER — SCOPOLAMINE 1 MG/3DAYS TD PT72
MEDICATED_PATCH | TRANSDERMAL | Status: DC | PRN
  Administered 2018-05-22: 1 via TRANSDERMAL

## 2018-05-22 MED ORDER — KETOROLAC TROMETHAMINE 30 MG/ML IJ SOLN
INTRAMUSCULAR | Status: AC
  Filled 2018-05-22: qty 1

## 2018-05-22 MED ORDER — LACTATED RINGERS IV SOLN
INTRAVENOUS | Status: DC
  Administered 2018-05-22: 15:00:00 via INTRAVENOUS
  Filled 2018-05-22 (×2): qty 1000

## 2018-05-22 MED ORDER — MIDAZOLAM HCL 2 MG/2ML IJ SOLN
INTRAMUSCULAR | Status: AC
  Filled 2018-05-22: qty 2

## 2018-05-22 MED ORDER — MIDAZOLAM HCL 2 MG/2ML IJ SOLN
INTRAMUSCULAR | Status: DC | PRN
  Administered 2018-05-22: 2 mg via INTRAVENOUS

## 2018-05-22 MED ORDER — KETOROLAC TROMETHAMINE 30 MG/ML IJ SOLN
INTRAMUSCULAR | Status: DC | PRN
  Administered 2018-05-22: 30 mg via INTRAVENOUS

## 2018-05-22 MED ORDER — DEXAMETHASONE SODIUM PHOSPHATE 10 MG/ML IJ SOLN
INTRAMUSCULAR | Status: AC
  Filled 2018-05-22: qty 1

## 2018-05-22 MED ORDER — LACTATED RINGERS IV SOLN
INTRAVENOUS | Status: DC
  Administered 2018-05-22 (×4): via INTRAVENOUS
  Filled 2018-05-22: qty 1000

## 2018-05-22 SURGICAL SUPPLY — 75 items
ADH SKN CLS APL DERMABOND .7 (GAUZE/BANDAGES/DRESSINGS) ×2
BARRIER ADHS 3X4 INTERCEED (GAUZE/BANDAGES/DRESSINGS) IMPLANT
BLADE SURG 10 STRL SS (BLADE) ×1 IMPLANT
BRR ADH 4X3 ABS CNTRL BYND (GAUZE/BANDAGES/DRESSINGS)
CANISTER SUCT 3000ML PPV (MISCELLANEOUS) ×3 IMPLANT
CATH FOLEY 3WAY  5CC 16FR (CATHETERS) ×1
CATH FOLEY 3WAY 5CC 16FR (CATHETERS) ×2 IMPLANT
CONT SPEC 4OZ CLIKSEAL STRL BL (MISCELLANEOUS) ×1 IMPLANT
COVER BACK TABLE 60X90IN (DRAPES) ×3 IMPLANT
COVER TIP SHEARS 8 DVNC (MISCELLANEOUS) ×2 IMPLANT
COVER TIP SHEARS 8MM DA VINCI (MISCELLANEOUS) ×1
COVER WAND RF STERILE (DRAPES) ×1 IMPLANT
DECANTER SPIKE VIAL GLASS SM (MISCELLANEOUS) ×5 IMPLANT
DEFOGGER SCOPE WARMER CLEARIFY (MISCELLANEOUS) ×3 IMPLANT
DERMABOND ADVANCED (GAUZE/BANDAGES/DRESSINGS) ×1
DERMABOND ADVANCED .7 DNX12 (GAUZE/BANDAGES/DRESSINGS) ×2 IMPLANT
DRAPE ARM DVNC X/XI (DISPOSABLE) ×8 IMPLANT
DRAPE COLUMN DVNC XI (DISPOSABLE) ×2 IMPLANT
DRAPE DA VINCI XI ARM (DISPOSABLE) ×4
DRAPE DA VINCI XI COLUMN (DISPOSABLE) ×1
DURAPREP 26ML APPLICATOR (WOUND CARE) ×3 IMPLANT
ELECT REM PT RETURN 9FT ADLT (ELECTROSURGICAL) ×3
ELECTRODE REM PT RTRN 9FT ADLT (ELECTROSURGICAL) ×2 IMPLANT
GAUZE PETROLATUM 1 X8 (GAUZE/BANDAGES/DRESSINGS) ×3 IMPLANT
GLOVE BIO SURGEON STRL SZ 6.5 (GLOVE) ×9 IMPLANT
GLOVE BIOGEL PI IND STRL 7.0 (GLOVE) ×10 IMPLANT
GLOVE BIOGEL PI INDICATOR 7.0 (GLOVE) ×5
HEMOSTAT ARISTA ABSORB 3G PWDR (HEMOSTASIS) ×1 IMPLANT
IRRIG SUCT STRYKERFLOW 2 WTIP (MISCELLANEOUS) ×3
IRRIGATION SUCT STRKRFLW 2 WTP (MISCELLANEOUS) ×2 IMPLANT
LEGGING LITHOTOMY PAIR STRL (DRAPES) ×3 IMPLANT
NEEDLE HYPO 22GX1.5 SAFETY (NEEDLE) ×1 IMPLANT
OBTURATOR OPTICAL STANDARD 8MM (TROCAR) ×1
OBTURATOR OPTICAL STND 8 DVNC (TROCAR) ×2
OBTURATOR OPTICALSTD 8 DVNC (TROCAR) ×2 IMPLANT
OCCLUDER COLPOPNEUMO (BALLOONS) ×3 IMPLANT
PACK ROBOT WH (CUSTOM PROCEDURE TRAY) ×3 IMPLANT
PACK ROBOTIC GOWN (GOWN DISPOSABLE) ×3 IMPLANT
PACK TRENDGUARD 450 HYBRID PRO (MISCELLANEOUS) IMPLANT
PAD PREP 24X48 CUFFED NSTRL (MISCELLANEOUS) ×3 IMPLANT
PENCIL BUTTON HOLSTER BLD 10FT (ELECTRODE) ×1 IMPLANT
POUCH ENDO CATCH II 15MM (MISCELLANEOUS) IMPLANT
PROTECTOR NERVE ULNAR (MISCELLANEOUS) ×6 IMPLANT
RTRCTR WOUND ALEXIS 18CM SML (INSTRUMENTS)
SAVER CELL AAL HAEMONETICS (INSTRUMENTS) IMPLANT
SEAL CANN UNIV 5-8 DVNC XI (MISCELLANEOUS) ×6 IMPLANT
SEAL XI 5MM-8MM UNIVERSAL (MISCELLANEOUS) ×3
SET CYSTO W/LG BORE CLAMP LF (SET/KITS/TRAYS/PACK) IMPLANT
SET TRI-LUMEN FLTR TB AIRSEAL (TUBING) ×3 IMPLANT
SPONGE LAP 18X18 RF (DISPOSABLE) ×1 IMPLANT
SUT ETHIBOND 0 (SUTURE) IMPLANT
SUT MNCRL AB 4-0 PS2 18 (SUTURE) ×1 IMPLANT
SUT PLAIN 2 0 XLH (SUTURE) ×1 IMPLANT
SUT VIC AB 0 CT1 18XCR BRD8 (SUTURE) IMPLANT
SUT VIC AB 0 CT1 27 (SUTURE) ×9
SUT VIC AB 0 CT1 27XBRD ANBCTR (SUTURE) IMPLANT
SUT VIC AB 0 CT1 8-18 (SUTURE) ×9
SUT VIC AB 4-0 PS2 18 (SUTURE) ×1 IMPLANT
SUT VIC AB 4-0 PS2 27 (SUTURE) ×10 IMPLANT
SUT VICRYL 0 TIES 12 18 (SUTURE) IMPLANT
SUT VICRYL 0 UR6 27IN ABS (SUTURE) ×3 IMPLANT
SUT VLOC 180 0 9IN  GS21 (SUTURE) ×1
SUT VLOC 180 0 9IN GS21 (SUTURE) ×2 IMPLANT
SUT VLOC 180 2-0 6IN GS21 (SUTURE) IMPLANT
SYR BULB IRRIGATION 50ML (SYRINGE) ×1 IMPLANT
TIP RUMI ORANGE 6.7MMX12CM (TIP) IMPLANT
TIP UTERINE 5.1X6CM LAV DISP (MISCELLANEOUS) IMPLANT
TIP UTERINE 6.7X10CM GRN DISP (MISCELLANEOUS) ×1 IMPLANT
TIP UTERINE 6.7X6CM WHT DISP (MISCELLANEOUS) IMPLANT
TIP UTERINE 6.7X8CM BLUE DISP (MISCELLANEOUS) IMPLANT
TOWEL OR 17X24 6PK STRL BLUE (TOWEL DISPOSABLE) ×7 IMPLANT
TRENDGUARD 450 HYBRID PRO PACK (MISCELLANEOUS) ×3
TROCAR HASSON GELL 12X100 (TROCAR) IMPLANT
TROCAR PORT AIRSEAL 5X120 (TROCAR) ×3 IMPLANT
WATER STERILE IRR 1000ML POUR (IV SOLUTION) ×2 IMPLANT

## 2018-05-22 NOTE — H&P (Signed)
Krystal Kelley is an 48 y.o. female. G0 Single  RP: Large Fibroids/Vaginal bleeding on Tamoxifen for Robotic TLH/BSO  HPI: H/O Rt Breast Ca ER positive, Lumpectomy 06/2014, then Radiation Therapy.  On Tamoxifen currently.  Started having vaginal bleeding in October 2019.  Pelvic pressure, discomfort especially when bleeding.  Large Fibroids.   Pertinent Gynecological History: Menses: Menometrorrhagia Contraception: abstinence Blood transfusions: none Sexually transmitted diseases: no past history Last mammogram: Benign  Last pap: normal  OB History: G0   Menstrual History: Patient's last menstrual period was 03/21/2018.    Past Medical History:  Diagnosis Date  . Anxiety   . Breast cancer, right breast ([place])   . Depression   . H/O laparoscopy   . History of hysteroscopy   . Peripheral edema    on lasix  . Radiation 10/30/14-12/17/14   right breast 50.4 gray    Past Surgical History:  Procedure Laterality Date  . BREAST BIOPSY  05/16/14  . BREAST LUMPECTOMY Right 06/18/2014  . bunion surgrery    . CARPAL TUNNEL RELEASE Left 09/21/2016   Procedure: LEFT CARPAL TUNNEL RELEASE;  Surgeon: Daryll Brod, MD;  Location: [place];  Service: Orthopedics;  Laterality: Left;  REG/FAB  . carpel tunnel    . CHOLECYSTECTOMY  2015  . PLANTAR FASCIECTOMY    . PORTACATH PLACEMENT Left 06/18/2014   Procedure: INSERTION PORT-A-CATH;  Surgeon: Excell Seltzer, MD;  Location: [facility];  Service: General;  Laterality: Left;    Family History  Problem Relation Age of Onset  . Kidney cancer Father        kidney  . Prostate cancer Maternal Uncle        deceased 40  . Lung cancer Maternal Grandfather        deceased 65; smoker  . Colon cancer Paternal Grandfather        deceased 40s  . Cancer Other        pat half-sister; ? uterine vs. ovarian ca    Social History:  reports that she has never smoked. She has never used smokeless tobacco. She reports  current alcohol use. She reports that she does not use drugs.  Allergies: No Known Allergies  Medications Prior to Admission  Medication Sig Dispense Refill Last Dose  . Coenzyme Q10 (COQ10) 100 MG CAPS Take 100 mg by mouth daily.   Past Month at Unknown time  . cycloSPORINE (RESTASIS) 0.05 % ophthalmic emulsion Place 1 drop into both eyes 2 (two) times daily.   05/22/2018 at Unknown time  . Eluxadoline (VIBERZI) 100 MG TABS Take 1 tablet (100 mg total) by mouth 2 (two) times daily. (Patient taking differently: Take 1 tablet by mouth 2 (two) times daily as needed (diarrhea/IBS). ) 60 tablet 5 Past Week at Unknown time  . Flaxseed, Linseed, (FLAXSEED OIL) 1000 MG CAPS Take 1,000 mg by mouth daily.   Past Month at Unknown time  . fluticasone (FLONASE) 50 MCG/ACT nasal spray Place 1 spray into both nostrils daily as needed for allergies.   Past Month at Unknown time  . furosemide (LASIX) 20 MG tablet TAKE 3 TABLETS(60 MG) BY MOUTH DAILY (Patient taking differently: Take 20 mg by mouth 3 (three) times daily. ) 90 tablet 0 05/21/2018 at Unknown time  . guaiFENesin (MUCINEX) 600 MG 12 hr tablet Take 600 mg by mouth 2 (two) times daily as needed (cold symptoms.).   Past Week at Unknown time  . hydrocortisone 2.5 % cream Apply 1 application  topically 2 (two) times daily as needed (irritated/itchy skin.).     [name] ibuprofen (ADVIL,MOTRIN) 200 MG tablet Take 400 mg by mouth every 8 (eight) hours as needed (pain.).   Past Week at Unknown time  . ketoconazole (NIZORAL) 2 % cream Apply 1 application topically daily as needed for irritation (irritated/itchy skin.).     [name] Multiple Vitamin (MULTIVITAMIN WITH MINERALS) TABS tablet Take 1 tablet by mouth daily. One-A-Day Multivitamin   Past Month at Unknown time  . naproxen sodium (ALEVE) 220 MG tablet Take 220-440 mg by mouth 2 (two) times daily as needed (pain.).   Past Week at Unknown time  . Pseudoephedrine-Ibuprofen (ADVIL COLD/SINUS PO) Take 1 tablet by mouth 4  (four) times daily as needed (cold symptoms.).   Past Week at Unknown time  . tamoxifen (NOLVADEX) 20 MG tablet Take 1 tablet (20 mg total) by mouth daily. 90 tablet 3 05/21/2018 at Unknown time  . vitamin B-12 (CYANOCOBALAMIN) 500 MCG tablet Take 500 mcg by mouth daily.   Past Month at Unknown time  . vitamin C (ASCORBIC ACID) 500 MG tablet Take 500 mg by mouth daily.   Past Week at Unknown time  . acetaminophen (TYLENOL) 500 MG tablet Take 500-1,000 mg by mouth every 6 (six) hours as needed (pain.).   More than a month at Unknown time  . BELVIQ 10 MG TABS Take 10 mg by mouth 2 (two) times daily. 60 tablet 5 More than a month at Unknown time  . ketorolac (TORADOL) 10 MG tablet Take 1 tablet (10 mg total) by mouth every 8 (eight) hours. (Patient not taking: Reported on 05/10/2018) 30 tablet 2 Not Taking at Unknown time  . valACYclovir (VALTREX) 500 MG tablet Take 500 mg by mouth 2 (two) times daily as needed (outbreaks.).   More than a month at Unknown time    REVIEW OF SYSTEMS: A ROS was performed and pertinent positives and negatives are included in the history.  GENERAL: No fevers or chills. HEENT: No change in vision, no earache, sore throat or sinus congestion. NECK: No pain or stiffness. CARDIOVASCULAR: No chest pain or pressure. No palpitations. PULMONARY: No shortness of breath, cough or wheeze. GASTROINTESTINAL: No abdominal pain, nausea, vomiting or diarrhea, melena or bright red blood per rectum. GENITOURINARY: No urinary frequency, urgency, hesitancy or dysuria. MUSCULOSKELETAL: No joint or muscle pain, no back pain, no recent trauma. DERMATOLOGIC: No rash, no itching, no lesions. ENDOCRINE: No polyuria, polydipsia, no heat or cold intolerance. No recent change in weight. HEMATOLOGICAL: No anemia or easy bruising or bleeding. NEUROLOGIC: No headache, seizures, numbness, tingling or weakness. PSYCHIATRIC: No depression, no loss of interest in normal activity or change in sleep pattern.      Blood pressure (!) 150/83, pulse 87, temperature 98.1 F (36.7 C), temperature source Oral, resp. rate 18, weight 121.6 kg, last menstrual period 03/21/2018, SpO2 100 %.  Physical Exam:  See office notes   Pelvic US 02/23/2018: T/V and T/a images. Uterus retroverted and enlarged measuring 13.36 x 11.8 x 8.56 cm. Intramural fibroids measuring 2.5 x 1.8 cm, 2.1 x 1.9 cm, 4.8 x 4.2 cm, 6.6 x 5.2 cm, 7.9 x 6.0 cm. Endometrium is thickened at 12.3 mm on tamoxifen and displaced by the fibroids. Right ovary appears normal, but limited image. Left ovary with a thin-walled echo-free cyst measuring 3.6 x 2.9 x 2.7 cm. No free fluid in the posterior cul-de-sac.  Endometrial Biopsy 02/23/2018: Benign   Assessment/Plan:  48 y.o. G0  1.  Fibroids Continued bleeding on tamoxifen for estrogen receptor positive right breast cancer.  Uterine fibroids and thickened endometrial line.  Endometrial biopsy done on February 23, 2018 was benign.  Decision to proceed with X I robotic total laparoscopic hysterectomy with bilateral salpingo-oophorectomy. Surgery and risks thoroughly reviewed with patient.  Patient agrees with plan.  2. Metrorrhagia As above.  3. Malignant neoplasm of lower-inner quadrant of right breast of female, estrogen receptor positive ([place]) On tamoxifen.                        Patient was counseled as to the risk of surgery to include the following:  1. Infection (prohylactic antibiotics will be administered)  2. DVT/Pulmonary Embolism (prophylactic pneumo compression stockings will be used)  3.Trauma to internal organs requiring additional surgical procedure to repair any injury to internal organs requiring perhaps additional hospitalization days.  4.Hemmorhage requiring transfusion and blood products which carry risks such as anaphylactic reaction, hepatitis and AIDS  Patient had received literature information on the procedure scheduled and all her questions were  answered and fully accepts all risk.  Krystal Kelley 05/22/2018, 6:08 AM

## 2018-05-22 NOTE — Transfer of Care (Signed)
  Last Vitals:  Vitals Value Taken Time  BP 144/82 05/22/2018 12:30 PM  Temp    Pulse 96 05/22/2018 12:32 PM  Resp 22 05/22/2018 12:32 PM  SpO2 97 % 05/22/2018 12:32 PM  Vitals shown include unvalidated device data.  Last Pain:  Vitals:   05/22/18 0542  TempSrc: Oral         Immediate Anesthesia Transfer of Care Note  Patient: Krystal Kelley  Procedure(s) Performed: Procedure(s) (LRB): ATTEMPTED XI ROBOTIC ASSISTED TOTAL HYSTERECTOMY WITH BILATERAL SALPINGO OOPHORECTOMY (Bilateral) XI ROBOTIC ASSISTED LAPAROSCOPIC LYSIS OF ADHESION HYSTERECTOMY ABDOMINAL WITH BILATERAL SALPINGO-OOPHORECTOMY  Patient Location: PACU  Anesthesia Type: General  Level of Consciousness: awake, alert  and oriented  Airway & Oxygen Therapy: Patient Spontanous Breathing and Patient connected to nasal cannula oxygen, nasal airway  Post-op Assessment: Report given to PACU RN and Post -op Vital signs reviewed and stable  Post vital signs: Reviewed and stable  Complications: No apparent anesthesia complications

## 2018-05-22 NOTE — Progress Notes (Signed)
Patient reports she takes lasix 20 mg TID PTA. Dr. Dellis Filbert notified, no new orders to resume lasix while inpatient, no BUE/BLE edema noted, lungs CTA. Obtained order for prn antiemetic. Discussed removing foley catheter after patient successfully ambulates, also notified of b/p of 151/90. Will continue to monitor. Orders enacted. Lyndel Pleasure RN.

## 2018-05-22 NOTE — Anesthesia Procedure Notes (Signed)
Procedure Name: Intubation Date/Time: 05/22/2018 7:43 AM Performed by: Belinda Block, MD Pre-anesthesia Checklist: Patient identified, Emergency Drugs available, Suction available and Patient being monitored Patient Re-evaluated:Patient Re-evaluated prior to induction Oxygen Delivery Method: Circle system utilized Preoxygenation: Pre-oxygenation with 100% oxygen Induction Type: IV induction Ventilation: Mask ventilation without difficulty Laryngoscope Size: Mac and 3 Grade View: Grade I Tube type: Oral Tube size: 7.0 mm Number of attempts: 1 Airway Equipment and Method: Stylet and Oral airway Placement Confirmation: ETT inserted through vocal cords under direct vision,  positive ETCO2 and breath sounds checked- equal and bilateral Secured at: 21 cm Tube secured with: Tape Dental Injury: Teeth and Oropharynx as per pre-operative assessment

## 2018-05-22 NOTE — Anesthesia Preprocedure Evaluation (Addendum)
Anesthesia Evaluation  Patient identified by MRN, date of birth, ID band Patient awake    Reviewed: Allergy & Precautions, NPO status , Patient's Chart, lab work & pertinent test results  History of Anesthesia Complications (+) PONV  Airway Mallampati: II  TM Distance: >3 FB     Dental   Pulmonary neg pulmonary ROS,    breath sounds clear to auscultation       Cardiovascular negative cardio ROS   Rhythm:Regular Rate:Normal     Neuro/Psych    GI/Hepatic negative GI ROS, Neg liver ROS,   Endo/Other  negative endocrine ROS  Renal/GU negative Renal ROS     Musculoskeletal   Abdominal   Peds  Hematology   Anesthesia Other Findings   Reproductive/Obstetrics                            Anesthesia Physical Anesthesia Plan  ASA: II  Anesthesia Plan: General   Post-op Pain Management:    Induction: Intravenous  PONV Risk Score and Plan: 4 or greater and Ondansetron, Dexamethasone and Midazolam  Airway Management Planned: Oral ETT  Additional Equipment:   Intra-op Plan:   Post-operative Plan: Extubation in OR  Informed Consent: I have reviewed the patients History and Physical, chart, labs and discussed the procedure including the risks, benefits and alternatives for the proposed anesthesia with the patient or authorized representative who has indicated his/her understanding and acceptance.     Dental advisory given  Plan Discussed with: CRNA and Anesthesiologist  Anesthesia Plan Comments:         Anesthesia Quick Evaluation

## 2018-05-22 NOTE — Anesthesia Postprocedure Evaluation (Signed)
Anesthesia Post Note  Patient: Krystal Kelley  Procedure(s) Performed: ATTEMPTED XI ROBOTIC ASSISTED TOTAL HYSTERECTOMY WITH BILATERAL SALPINGO OOPHORECTOMY (Bilateral ) XI ROBOTIC ASSISTED LAPAROSCOPIC LYSIS OF ADHESION HYSTERECTOMY ABDOMINAL WITH BILATERAL SALPINGO-OOPHORECTOMY     Patient location during evaluation: PACU Anesthesia Type: General Level of consciousness: awake Pain management: pain level controlled Vital Signs Assessment: post-procedure vital signs reviewed and stable Respiratory status: spontaneous breathing Cardiovascular status: stable Anesthetic complications: no    Last Vitals:  Vitals:   05/22/18 1230 05/22/18 1245  BP: (!) 144/82 134/78  Pulse: 100 86  Resp: (!) 24 (!) 22  Temp:    SpO2: 93% 98%    Last Pain:  Vitals:   05/22/18 1245  TempSrc:   PainSc: Asleep                 [name]

## 2018-05-22 NOTE — Op Note (Addendum)
Operative Note  05/22/2018  12:00 PM  PATIENT:  Krystal Kelley  48 y.o. female  PRE-OPERATIVE DIAGNOSIS:  Uterine fibroids, pelvic pain, irregular bleeding, history of breast cancer ER positive on Tamoxifen  POST-OPERATIVE DIAGNOSIS:  Uterine fibroids, pelvic pain, irregular bleeding, history of breast cancer ER positive on Tamoxifen, SEVERE ENDOMETRIOSIS, SEVERE ADHESIONS  PROCEDURE:  Procedure(s): ATTEMPTED XI ROBOTIC ASSISTED TOTAL HYSTERECTOMY WITH BILATERAL SALPINGO OOPHORECTOMY XI ROBOTIC ASSISTED LAPAROSCOPIC EXTENSIVE LYSIS OF ADHESIONS HYSTERECTOMY ABDOMINAL WITH BILATERAL SALPINGO-OOPHORECTOMY  SURGEON:  Surgeon(s): [name], MD Fontaine, Belinda Block, MD  ANESTHESIA:   general  FINDINGS: Multiple large uterine myomas, severe adhesions between bowels, ovaries and uterus and bladder adhesions with uterus.  DESCRIPTION OF OPERATION: Under general anesthesia with endotracheal intubation the patient is in lithotomy position.  She is prepped with DuraPrep on the abdomen and with Betadine on the suprapubic, vulvar and vaginal areas.  Timeout is done.  The Foley catheter is inserted in the bladder.  The weighted speculum is inserted in the vagina.  The anterior lip of the cervix is grasped with a tenaculum.  Hysterometry is at 12 cm.  We use a #10 Rumi with a medium Koh ring.  Those are put in place without difficulty and the other instruments are removed.  The supraumbilical area is infiltrated with Marcaine one quarter plain.  A 1.5 cm incision is made with a scalpel at that level.  The aponeurosis is grasped with Coker's and the aponeurosis is open with Mayo scissors.  The parietal peritoneum is opened bluntly with the finger.  A pursestring stitch of Vicryl 0 was done on the aponeurosis.  The Sheryle Hail is inserted under direct vision and up pneumoperitoneum is created with CO2.  The camera was inserted and the port and inspection of the abdomen is normal, the inspection of the  pelvis reveals a large uterus with multiple fibroids, ovaries appear normal as well as the tubes but severe adhesions are present between the bowels and the posterior aspect of the uterus as well as with both adnexa.  Lesions of endometriosis are seen.  The bladder is adherent to the uterus anteriorly.  The anterior wall of the abdomen is free of adhesions.  The camera is removed.  We continue with port placement by marking the skin with a pen at the port sites.  We then infiltrate Marcaine one quarter plain.  And make small incisions with a scalpel at each level.  2 robotic ports are entered under direct vision on the right side and one robotic ports on the left side distally, with the assistant 5 mm port on the left medial aspect of the abdomen.  The patient is positioned in deep Trendelenburg.  The robot is docked from the right side.  Targeting is done.  All instruments are inserted under direct vision with the fenestrated clamp in the fourth arm, the scissors in the third arm, the camera and the second arm and the PK in the first arm.  We go to the console.      Pictures are taken of all the pelvic structures.  We proceeded with lysis of additions at the posterior uterus and at both adnexa.  We are able to free the left infundibulopelvic ligament we stay very high and close to the ovary to cauterize and section.  We are able to release the adhesions between the bladder and the anterior uterus.  On the right side we are able to release adhesions to visualize the right infundibulopelvic ligament.  But given that the ideations are still extensive with the bowels and that the posterior cul-de-sac is still completely obliterated, the decision is made to change to an open approach rather than continue with the robot.  Hemostasis is adequate at all levels.  The robotic instruments are removed.  The robot is undocked.  All ports are removed and the CO2 is evacuated.  We attached the supraumbilical pursestring stitch  on the aponeurosis.  All incisions are closed with separate stitches of Vicryl 4-0.  Dermabond is added on all incisions.  Hemostasis is adequate at all levels.  The Rumi and the [place] ring are removed from the vagina.  The patient is positioned in decubitus dorsal.  We make a Pfannenstiel incision with a scalpel.  The aponeurosis is opened transversely with the electrocautery and Mayo scissors.  We used Coker's on the aponeurosis to separate the aponeurosis from the recti muscles on the midline superiorly and inferiorly.  The parietal peritoneum is open with Metzenbaums scissors.  The parietal peritoneum is opened longitudinally with Metzenbaums scissors.  We used a Balfour as a retractor and put for labs to retract the bowels.  The uterus is grasped at the corner on each side with clamps.  The left round ligament is grasped with a Babcock.  We suture the left round ligament with a Vicryl 0.  We sectioned the left round ligament with cauterization.  We proceed the same way on the right side.  We then clamped the remaining left infundibulopelvic ligament with a curved Heaney.  We sectioned with Mayo scissors and sutured with a Vicryl 0.  We proceeded the same way on the right side.  We then descended the bladder anteriorly.  Vision deep in the pelvis was made difficult by the large Fibroids, we therefore proceeded with 2 Myomectomies after infiltrating with Vasopressin.  This allowed to visualize the left uterine artery well.  We clamped it with a curved Heaney, sectioned with Mayo scissors and sutured with a Vicryl 0.  We proceeded the same way on the right side.  We the sectioned the uterus at the junction of the upper cervix with the scalpel.  The uterus without cervix, tubes and ovaries were removed from the pelvis to be sent to pathology later.  We grasped the cervix with Cocker clamps.  The bladder was further descended dissecting with [facility] scissors.  We clamped with straight Heaneys both sides of the cervix  progressively until reaching the angles of the vagina.  Each time with sectioned with the scalpel and sutured with a Vicryl 0.  Each angle of the vagina with clamped with a curved Heaney, sectioned with the scissors and sutured with a Vicryl 0.  We finished closing the vagina with a running suture of Vicryl 0.  Hemostasis was good at the vaginal vault.  Hemostasis with completed at the left infundibulopelvic ligament with a Vicryl 0.  We irrigated and suctioned the pelvis.  Urine was clear.  We sprayed Arista in the pelvis.  All laps were removed.  The Balfour retractor was removed.  The aponeurosis was closed with 2 half running suture of Vicryl 0.  Hemostasis was completed with the electrocautery on the adipose tissue.  The skin was closed with a subcuticular stitch of Vicryl 4-0.  Dermabond was added and a Honeycomb dressing was put in place.  The wand was passed to confirm the absence of sponges in the abdomen and pelvis.  The patient was brought to recovery room in good and  stable status.  ESTIMATED BLOOD LOSS: 750 mL   Intake/Output Summary (Last 24 hours) at 05/22/2018 1200 Last data filed at 05/22/2018 1142 Gross per 24 hour  Intake 2250 ml  Output 1125 ml  Net 1125 ml     BLOOD ADMINISTERED:none   LOCAL MEDICATIONS USED:  MARCAINE 0.25% plain  SPECIMEN:  Source of Specimen:  Uterus with cervix, bilateral tubes and ovaries  DISPOSITION OF SPECIMEN:  PATHOLOGY  COUNTS:  YES  PLAN OF CARE: Transfer to PACU  Marie-Lyne LavoieMD12:00 PM

## 2018-05-23 DIAGNOSIS — N888 Other specified noninflammatory disorders of cervix uteri: Secondary | ICD-10-CM | POA: Diagnosis not present

## 2018-05-23 LAB — CBC
HCT: 30.7 % — ABNORMAL LOW (ref 36.0–46.0)
Hemoglobin: 9.9 g/dL — ABNORMAL LOW (ref 12.0–15.0)
MCH: 31.5 pg (ref 26.0–34.0)
MCHC: 32.2 g/dL (ref 30.0–36.0)
MCV: 97.8 fL (ref 80.0–100.0)
Platelets: 235 10*3/uL (ref 150–400)
RBC: 3.14 MIL/uL — ABNORMAL LOW (ref 3.87–5.11)
RDW: 13.4 % (ref 11.5–15.5)
WBC: 15 10*3/uL — ABNORMAL HIGH (ref 4.0–10.5)
nRBC: 0 % (ref 0.0–0.2)

## 2018-05-23 MED ORDER — OXYCODONE HCL 5 MG PO TABS
ORAL_TABLET | ORAL | Status: AC
  Filled 2018-05-23: qty 2

## 2018-05-23 MED ORDER — OXYCODONE-ACETAMINOPHEN 7.5-325 MG PO TABS: 1.0000 | ORAL_TABLET | Freq: Four times a day (QID) | ORAL | 0 refills | Status: DC | PRN

## 2018-05-23 NOTE — Discharge Instructions (Signed)
Abdominal Hysterectomy, Care After °This sheet gives you information about how to care for yourself after your procedure. Your health care provider may also give you more specific instructions. If you have problems or questions, contact your health care provider. °What can I expect after the procedure? °After your procedure, it is common to have: °· Pain. °· Fatigue. °· Poor appetite. °· Less interest in sex. °· Vaginal bleeding and discharge. You may need to use a sanitary napkin after this procedure. °Follow these instructions at home: °Bathing °· Do not take baths, swim, or use a hot tub until your health care provider approves. Ask your health care provider if you can take showers. You may only be allowed to take sponge baths for bathing. °· Keep the bandage (dressing) dry until your health care provider says it can be removed. °Incision care ° °· Follow instructions from your health care provider about how to take care of your incision. Make sure you: °? Wash your hands with soap and water before you change your bandage (dressing). If soap and water are not available, use hand sanitizer. °? Change your dressing as told by your health care provider. °? Leave stitches (sutures), skin glue, or adhesive strips in place. These skin closures may need to stay in place for 2 weeks or longer. If adhesive strip edges start to loosen and curl up, you may trim the loose edges. Do not remove adhesive strips completely unless your health care provider tells you to do that. °· Check your incision area every day for signs of infection. Check for: °? Redness, swelling, or pain. °? Fluid or blood. °? Warmth. °? Pus or a bad smell. °Activity °· Do gentle, daily exercises as told by your health care provider. You may be told to take short walks every day and go farther each time. °· Do not lift anything that is heavier than 10 lb (4.5 kg), or the limit that your health care provider tells you, until he or she says that it is  safe. °· Do not drive or use heavy machinery while taking prescription pain medicine. °· Do not drive for 24 hours if you were given a medicine to help you relax (sedative). °· Follow your health care provider's instructions about exercise, driving, and general activities. Ask your health care provider what activities are safe for you. °Lifestyle °· Do not douche, use tampons, or have sex for at least 6 weeks or as told by your health care provider. °· Do not drink alcohol until your health care provider approves. °· Drink enough fluid to keep your urine clear or pale yellow. °· Try to have someone at home with you for the first 1-2 weeks to help. °· Do not use any products that contain nicotine or tobacco, such as cigarettes and e-cigarettes. These can delay healing. If you need help quitting, ask your health care provider. °General instructions °· Take over-the-counter and prescription medicines only as told by your health care provider. °· Do not take aspirin or ibuprofen. These medicines can cause bleeding. °· To prevent or treat constipation while you are taking prescription pain medicine, your health care provider may recommend that you: °? Drink enough fluid to keep your urine clear or pale yellow. °? Take over-the-counter or prescription medicines. °? Eat foods that are high in fiber, such as fresh fruits and vegetables, whole grains, and beans. °? Limit foods that are high in fat and processed sugars, such as fried and sweet foods. °· Keep all   follow-up visits as told by your health care provider. This is important. °Contact a health care provider if: °· You have chills or fever. °· You have redness, swelling, or pain around your incision. °· You have fluid or blood coming from your incision. °· Your incision feels warm to the touch. °· You have pus or a bad smell coming from your incision. °· Your incision breaks open. °· You feel dizzy or light-headed. °· You have pain or bleeding when you urinate. °· You  have persistent diarrhea. °· You have persistent nausea and vomiting. °· You have abnormal vaginal discharge. °· You have a rash. °· You have any type of abnormal reaction or you develop an allergy to your medicine. °· Your pain medicine does not help. °Get help right away if: °· You have a fever and your symptoms suddenly get worse. °· You have severe abdominal pain. °· You have shortness of breath. °· You faint. °· You have pain, swelling, or redness in your leg. °· You have heavy vaginal bleeding with blood clots. °Summary °· After your procedure, it is common to have pain, fatigue and vaginal discharge. °· Do not take baths, swim, or use a hot tub until your health care provider approves. Ask your health care provider if you can take showers. You may only be allowed to take sponge baths for bathing. °· Follow your health care provider's instructions about exercise, driving, and general activities. Ask your health care provider what activities are safe for you. °· Do not lift anything that is heavier than 10 lb (4.5 kg), or the limit that your health care provider tells you, until he or she says that it is safe. °· Try to have someone at home with you for the first 1-2 weeks to help. °This information is not intended to replace advice given to you by your health care provider. Make sure you discuss any questions you have with your health care provider. °Document Released: 10/23/2004 Document Revised: 03/24/2016 Document Reviewed: 03/24/2016 °Elsevier Interactive Patient Education © 2019 [employer] ° °

## 2018-05-23 NOTE — Progress Notes (Signed)
POD#1 Robotic extensive lysis of adhesions/TAH/BSO   Subjective: Patient reports tolerating PO, + flatus and no problems voiding.    Objective: I have reviewed patient's vital signs.  vital signs, intake and output, medications and labs.  Vitals:   05/23/18 0000 05/23/18 0515  BP: 138/85 (!) 116/58  Pulse: 96 90  Resp: 16 17  Temp: 99.2 F (37.3 C) 99.5 F (37.5 C)  SpO2: 100% 99%   I/O last 3 completed shifts: In: 0762 [P.O.:480; I.V.:3100; IV Piggyback:250] Out: 3225 [Urine:2425; Blood:800] No intake/output data recorded.  Results for orders placed or performed during the hospital encounter of 05/22/18 (from the past 24 hour(s))  CBC     Status: Abnormal   Collection Time: 05/23/18  5:15 AM  Result Value Ref Range   WBC 15.0 (H) 4.0 - 10.5 K/uL   RBC 3.14 (L) 3.87 - 5.11 MIL/uL   Hemoglobin 9.9 (L) 12.0 - 15.0 g/dL   HCT 30.7 (L) 36.0 - 46.0 %   MCV 97.8 80.0 - 100.0 fL   MCH 31.5 26.0 - 34.0 pg   MCHC 32.2 30.0 - 36.0 g/dL   RDW 13.4 11.5 - 15.5 %   Platelets 235 150 - 400 K/uL   nRBC 0.0 0.0 - 0.2 %    EXAM General: alert and cooperative Resp: clear to auscultation bilaterally Cardio: regular rate and rhythm GI: normal findings: bowel sounds normal, no masses palpable and Soft, normal postop tenderness and incision: clean, dry and intact Extremities: no edema, redness or tenderness in the calves or thighs Vaginal Bleeding: none  Assessment: s/p Procedure(s): ATTEMPTED XI ROBOTIC ASSISTED TOTAL HYSTERECTOMY WITH BILATERAL SALPINGO OOPHORECTOMY XI ROBOTIC ASSISTED LAPAROSCOPIC LYSIS OF ADHESION HYSTERECTOMY ABDOMINAL WITH BILATERAL SALPINGO-OOPHORECTOMY: stable, progressing well and tolerating diet  Plan: Advance diet Encourage ambulation Discontinue IV fluids Discharge home  LOS: 0 days    [name], MD 05/23/2018 8:23 AM    05/23/2018, 8:23 AM

## 2018-05-23 NOTE — Discharge Summary (Addendum)
Physician Discharge Summary  Patient ID: Krystal Kelley MRN: [id number] DOB/AGE: Mar 10, 1971 48 y.o.  Admit date: 05/22/2018 Discharge date: 05/23/2018  Admission Diagnoses: uterine fibroids, pelvic pain, irregular bleeding, history of breast cancer ER positive on Tamoxifen   Discharge Diagnoses: uterine fibroids, pelvic pain, irregular bleeding, history of breast cancer ER positive on Tamoxifen, severe endometriosis with severe adhesions Active Problems:   Post-operative state   Postoperative state   Discharged Condition: good  Consults:None  Significant Diagnostic Studies: labs: Hb 9.9 postop  Treatments:surgery: Robotic extensive lysis of adhesions.  Total Abdominal Hysterectomy with Bilateral Salpingo-Oophorectomy  Vitals:   05/23/18 0000 05/23/18 0515  BP: 138/85 (!) (P) 116/58  Pulse: 96 (P) 90  Resp: 16 (P) 17  Temp: 99.2 F (37.3 C) (P) 99.5 F (37.5 C)  SpO2: 100% (P) 99%     No intake/output data recorded.   Hospital Course: Good  Discharge Exam: Normal Postop  Disposition: Discharge home     Allergies as of 05/23/2018   No Known Allergies     Medication List    STOP taking these medications   ketorolac 10 MG tablet Commonly known as:  TORADOL   naproxen sodium 220 MG tablet Commonly known as:  ALEVE     TAKE these medications   acetaminophen 500 MG tablet Commonly known as:  TYLENOL Take 500-1,000 mg by mouth every 6 (six) hours as needed (pain.).   ADVIL COLD/SINUS PO Take 1 tablet by mouth 4 (four) times daily as needed (cold symptoms.).   BELVIQ 10 MG Tabs Generic drug:  Lorcaserin HCl Take 10 mg by mouth 2 (two) times daily.   CoQ10 100 MG Caps Take 100 mg by mouth daily.   cycloSPORINE 0.05 % ophthalmic emulsion Commonly known as:  RESTASIS Place 1 drop into both eyes 2 (two) times daily.   Eluxadoline 100 MG Tabs Commonly known as:  VIBERZI Take 1 tablet (100 mg total) by mouth 2 (two) times daily. What changed:    when  to take this  reasons to take this   Flaxseed Oil 1000 MG Caps Take 1,000 mg by mouth daily.   fluticasone 50 MCG/ACT nasal spray Commonly known as:  FLONASE Place 1 spray into both nostrils daily as needed for allergies.   furosemide 20 MG tablet Commonly known as:  LASIX TAKE 3 TABLETS(60 MG) BY MOUTH DAILY What changed:  See the new instructions.   guaiFENesin 600 MG 12 hr tablet Commonly known as:  MUCINEX Take 600 mg by mouth 2 (two) times daily as needed (cold symptoms.).   hydrocortisone 2.5 % cream Apply 1 application topically 2 (two) times daily as needed (irritated/itchy skin.).   ibuprofen 200 MG tablet Commonly known as:  ADVIL,MOTRIN Take 400 mg by mouth every 8 (eight) hours as needed (pain.).   ketoconazole 2 % cream Commonly known as:  NIZORAL Apply 1 application topically daily as needed for irritation (irritated/itchy skin.).   multivitamin with minerals Tabs tablet Take 1 tablet by mouth daily. One-A-Day Multivitamin   tamoxifen 20 MG tablet Commonly known as:  NOLVADEX Take 1 tablet (20 mg total) by mouth daily.   valACYclovir 500 MG tablet Commonly known as:  VALTREX Take 500 mg by mouth 2 (two) times daily as needed (outbreaks.).   vitamin B-12 500 MCG tablet Commonly known as:  CYANOCOBALAMIN Take 500 mcg by mouth daily.   vitamin C 500 MG tablet Commonly known as:  ASCORBIC ACID Take 500 mg by mouth daily.  oxyCODONE-acetaminophen (PERCOCET) 7.5-325 MG tablet  Details: Take 1 tablet by mouth every 6 (six) hours as needed for severe pain.  Provider: [name], MD  Status: New at Discharge        Signed: [name] 05/23/2018, 7:33 AM

## 2018-05-24 ENCOUNTER — Encounter (HOSPITAL_BASED_OUTPATIENT_CLINIC_OR_DEPARTMENT_OTHER): Payer: Self-pay | Admitting: Obstetrics & Gynecology

## 2018-05-25 ENCOUNTER — Telehealth: Payer: Self-pay | Admitting: Obstetrics & Gynecology

## 2018-05-25 NOTE — Telephone Encounter (Signed)
48 yo SAA female who underwent an attempted robotic hysterectomy that was converted to a TAH on 05/22/18.  She's been doing well post op but noted this evening a large blister beneath her umbilicus and above her incision.  It is not red but is a large blister, about the size of an orange.  She's not had any fever.  Pain is under good control.  She's anxious about how large this is and wonders what could cause this.  I reviewed her operative note and cannot come up with any cause for the blister.  After asking some additional questions, she has been using a heating pad which could cause a blister of this size.  As she's been talking with me, she's been in a car with a family member.  She's heading to the ER at [place] if I think she should be seen.  She is obviously worried about this but I think this is just a blister, possibly from heating pad and it does not sound like there is any infection.  It is 10:30pm.  Advised, I think it is safe to wait and be seen tomorrow in the office but if she would feel better being seen, then she should go on into the ER.  She is going to be seen in the ER and voiced appreciation of the phone call.

## 2018-05-26 ENCOUNTER — Encounter: Payer: Self-pay | Admitting: Obstetrics & Gynecology

## 2018-05-26 ENCOUNTER — Ambulatory Visit (INDEPENDENT_AMBULATORY_CARE_PROVIDER_SITE_OTHER): Payer: Medicare Other | Admitting: Obstetrics & Gynecology

## 2018-05-26 VITALS — BP 138/90 | Wt 269.0 lb

## 2018-05-26 DIAGNOSIS — Z09 Encounter for follow-up examination after completed treatment for conditions other than malignant neoplasm: Secondary | ICD-10-CM

## 2018-05-26 DIAGNOSIS — T148XXA Other injury of unspecified body region, initial encounter: Secondary | ICD-10-CM

## 2018-05-26 NOTE — Patient Instructions (Signed)
1. Status post gynecological surgery, follow-up exam Very good postop evolution with no complication.  Will remove the honeycomb dressing at 7 days postop.  We will follow-up for reevaluation at 3 weeks postop.  2. Blister Large intact blister at the mid abdomen infraumbilically secondary to the heat pad.  Precautions reviewed with patient recommended not to rupture the blister.  If it does rupture spontaneously, recommended to apply Neosporin cream.  Krystal Kelley, it was a pleasure seeing you today!

## 2018-05-26 NOTE — Progress Notes (Signed)
    Krystal Kelley October 06, 1970 [id number]        47 y.o.  G0P0000  POD#4 Robotic Lysis of adhesions/conversion to TAH/BSO.  RP: Blister on abdomen  HPI: Good postop evolution with decreasing abdominal pelvic pain, no vaginal bleeding, no fever.  Tolerating food well, no vomiting, normal bowel movements.  Urine normal.  Patient has been using a heating pad on her abdomen and now developed a large blister at that location.  The blister is not ruptured.     OB History  Gravida Para Term Preterm AB Living  0 0 0 0 0 0  SAB TAB Ectopic Multiple Live Births  0 0 0 0 0    Past medical history,surgical history, problem list, medications, allergies, family history and social history were all reviewed and documented in the EPIC chart.   Directed ROS with pertinent positives and negatives documented in the history of present illness/assessment and plan.  Exam:  Vitals:   05/26/18 1106  BP: 138/90  Weight: 269 lb (122 kg)   General appearance:  Normal  Abdomen: Soft, not distended.  Midline infra-umbilical large blister about 6 x 4 cm, intact.  No sign of infection.  Incision covered with Honeycomb dressing, dry.  Gynecologic exam: Deferred   Assessment/Plan:  48 y.o. G0P0000   1. Status post gynecological surgery, follow-up exam Very good postop evolution with no complication.  Will remove the honeycomb dressing at 7 days postop.  We will follow-up for reevaluation at 3 weeks postop.  2. Blister Large intact blister at the mid abdomen infraumbilically secondary to the heat pad.  Precautions reviewed with patient recommended not to rupture the blister.  If it does rupture spontaneously, recommended to apply Neosporin cream.  Counseling on above issues and coordination of care more than 50% for 15 minutes.  [name] MD, 11:10 AM 05/26/2018    o9-

## 2018-05-29 NOTE — Telephone Encounter (Signed)
Patient informed with pathology report.

## 2018-06-07 ENCOUNTER — Encounter: Payer: Self-pay | Admitting: Anesthesiology

## 2018-06-13 ENCOUNTER — Ambulatory Visit (INDEPENDENT_AMBULATORY_CARE_PROVIDER_SITE_OTHER): Payer: Medicare Other | Admitting: Obstetrics & Gynecology

## 2018-06-13 ENCOUNTER — Encounter: Payer: Self-pay | Admitting: Obstetrics & Gynecology

## 2018-06-13 VITALS — BP 144/80 | Wt 262.0 lb

## 2018-06-13 DIAGNOSIS — Z09 Encounter for follow-up examination after completed treatment for conditions other than malignant neoplasm: Secondary | ICD-10-CM
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
## 2018-06-13 NOTE — Progress Notes (Signed)
    Krystal Kelley 1970-08-13 [id number]        48 y.o.  G0P0000   RP: Postop 3 weeks TAH/BSO  HPI: Healing well.  No abdo-pelvic pain.  No vaginal bleeding.  Urine and BMs normal.  No fever.  Burnt herself with the heating pad at home on POD# 1-3, the wound is mid abdomen below the umbilicus, getting smaller and smaller, patient is applying Neosporin on it.   OB History  Gravida Para Term Preterm AB Living  0 0 0 0 0 0  SAB TAB Ectopic Multiple Live Births  0 0 0 0 0    Past medical history,surgical history, problem list, medications, allergies, family history and social history were all reviewed and documented in the EPIC chart.   Directed ROS with pertinent positives and negatives documented in the history of present illness/assessment and plan.  Exam:  Vitals:   06/13/18 1229  BP: (!) 144/80  Weight: 262 lb (118.8 kg)   General appearance:  Normal  Abdomen: Incisions well healed, no erythema, well closed.  Burn from heating pad at infra-umbilical midline healing well, no sign of infection.  Gynecologic exam: Vulva normal.  Speculum:  Vaginal vault healing well.   Assessment/Plan:  48 y.o. G0  1. Status post gynecological surgery, follow-up exam Good postop evolution.  No complication except for burning herself with her heating pad at home in POD# 1-3.  Burn wound healing well, no sign of infection.  Precaution and care reviewed with patient.  Will apply warm wet compresses every day and continue to apply Neosporin at night.  No heavy lifting, no IC until 6-7 weeks postop.  F/U for reevaluation in 4 weeks.  [name] MD, 12:37 PM 06/13/2018

## 2018-06-20 ENCOUNTER — Telehealth: Payer: Self-pay | Admitting: *Deleted

## 2018-06-20 NOTE — Telephone Encounter (Signed)
Sounds good, can clean the incision.

## 2018-06-20 NOTE — Telephone Encounter (Signed)
Patient informed. 

## 2018-06-20 NOTE — Telephone Encounter (Signed)
Patient called TAH/BSO, wanted to confirm incision site was okay, on left side noticed small amount of bleeding was still puffy at the site. Today no bleeding, has bandage on area when removing she noticed small amount of yellow color. Please advise

## 2018-07-10 ENCOUNTER — Other Ambulatory Visit: Payer: Self-pay

## 2018-07-12 ENCOUNTER — Encounter: Payer: Self-pay | Admitting: Obstetrics & Gynecology

## 2018-07-12 ENCOUNTER — Other Ambulatory Visit: Payer: Self-pay

## 2018-07-12 ENCOUNTER — Ambulatory Visit (INDEPENDENT_AMBULATORY_CARE_PROVIDER_SITE_OTHER): Payer: Medicare Other | Admitting: Obstetrics & Gynecology

## 2018-07-12 VITALS — BP 116/78

## 2018-07-12 DIAGNOSIS — X158XXA Contact with other hot household appliances, initial encounter: Secondary | ICD-10-CM | POA: Diagnosis not present

## 2018-07-12 DIAGNOSIS — T2102XA Burn of unspecified degree of abdominal wall, initial encounter: Secondary | ICD-10-CM

## 2018-07-12 DIAGNOSIS — Z09 Encounter for follow-up examination after completed treatment for conditions other than malignant neoplasm: Secondary | ICD-10-CM

## 2018-07-12 DIAGNOSIS — T300 Burn of unspecified body region, unspecified degree: Secondary | ICD-10-CM

## 2018-07-12 NOTE — Progress Notes (Signed)
    Krystal Kelley October 26, 1970 [id number]        48 y.o.  G0  RP: Postop TAH/BSO 05/22/2018 and burn wound care  HPI: Good postop healing with no abdominal pelvic pains, no vaginal bleeding or discharge.  No fever.  Lower abdominal skin burn with heating pad healing well.  No pain sensation at that level.   OB History  Gravida Para Term Preterm AB Living  0 0 0 0 0 0  SAB TAB Ectopic Multiple Live Births  0 0 0 0 0    Past medical history,surgical history, problem list, medications, allergies, family history and social history were all reviewed and documented in the EPIC chart.   Directed ROS with pertinent positives and negatives documented in the history of present illness/assessment and plan.  Exam:  Vitals:   07/12/18 0948  BP: 116/78   General appearance:  Normal  Abdomen: Incisions well healed.  Burnt area slowly healing, smaller area of wound.  Necrotic tissue debridement.  Peroxide to clean.  Dressing reapplied. Physical Exam Abdominal:       Gynecologic exam: Vulva normal.  Bimanual exam:  Vaginal vault well healed, NT, no mass felt.   Assessment/Plan:  48 y.o. G0P0000   1. Status post gynecological surgery, follow-up exam Excellent postop evolution with no complication, except for burn from heating pad.  Patient can resume physical activities and sexual activities.    2. Burn Patient burnt her lower abdomen with a heat pad at home postop day 1-2.  Healing well, good granulation tissue, no sign of infection, necrotic tissue debridement done.  Follow-up in 3 weeks for burn wound care.  Counseling on above issues and coordination of care more than 50% for 15 minutes.   [name] MD, 9:54 AM 07/12/2018

## 2018-07-12 NOTE — Patient Instructions (Signed)
1. Status post gynecological surgery, follow-up exam Excellent postop evolution with no complication, except for burn from heating pad.  Patient can resume physical activities and sexual activities.    2. Burn Patient burnt her lower abdomen with a heat pad at home postop day 1-2.  Healing well, good granulation tissue, no sign of infection, necrotic tissue debridement done.  Follow-up in 3 weeks for burn wound care.  Merida, it was a pleasure seeing you today!

## 2018-07-27 ENCOUNTER — Other Ambulatory Visit: Payer: Self-pay

## 2018-07-27 ENCOUNTER — Encounter: Payer: Self-pay | Admitting: Hematology and Oncology

## 2018-07-31 ENCOUNTER — Ambulatory Visit: Payer: Medicare Other | Admitting: Obstetrics & Gynecology

## 2018-08-02 ENCOUNTER — Other Ambulatory Visit: Payer: Self-pay

## 2018-08-02 ENCOUNTER — Encounter: Payer: Self-pay | Admitting: Obstetrics & Gynecology

## 2018-08-02 ENCOUNTER — Ambulatory Visit (INDEPENDENT_AMBULATORY_CARE_PROVIDER_SITE_OTHER): Payer: Medicare Other | Admitting: Obstetrics & Gynecology

## 2018-08-02 VITALS — BP 130/78 | Ht 64.0 in | Wt 270.0 lb

## 2018-08-02 DIAGNOSIS — T2102XA Burn of unspecified degree of abdominal wall, initial encounter: Secondary | ICD-10-CM

## 2018-08-02 DIAGNOSIS — T300 Burn of unspecified body region, unspecified degree: Secondary | ICD-10-CM

## 2018-08-02 DIAGNOSIS — X158XXA Contact with other hot household appliances, initial encounter: Secondary | ICD-10-CM

## 2018-08-02 MED ORDER — PHENTERMINE HCL 37.5 MG PO CAPS: 37.5000 mg | ORAL_CAPSULE | ORAL | 2 refills | Status: DC

## 2018-08-02 NOTE — Progress Notes (Signed)
    Krystal Kelley March 12, 1971 [id number]        48 y.o.  G0  RP: Heating pad burn wound care  HPI: Progressively smaller wound.  No pain, no redness, no drainage.  No fever.   OB History  Gravida Para Term Preterm AB Living  0 0 0 0 0 0  SAB TAB Ectopic Multiple Live Births  0 0 0 0 0    Past medical history,surgical history, problem list, medications, allergies, family history and social history were all reviewed and documented in the EPIC chart.   Directed ROS with pertinent positives and negatives documented in the history of present illness/assessment and plan.  Exam:  Vitals:   08/02/18 1214  BP: 130/78   General appearance:  Normal  Abdomen:   Burn wound smaller with healthy skin growth.  Good granulation tissue.  Minimal necrotic tissue removed.  No erythema.  No pus.  Dressing redone.  Gynecologic exam: Deferred   Assessment/Plan:  48 y.o. G0  1. Burn Good healing of burn wound caused by heating pad.  Good granulation tissue and minimal necrotic tissue which was removed.  No evidence of infection.  Continue to apply clean warm compresses at least 3 times a day.  Continue same wound care.  No need to re-assess if closing progressively without complication.  Follow-up annual gynecologic exam September 2020.  Counseling on above issues and coordination of care more than 50% for 15 minutes.  [name] MD, 12:32 PM 08/02/2018

## 2018-08-02 NOTE — Patient Instructions (Addendum)
1. Burn Good healing of burn wound caused by heating pad.  Good granulation tissue and minimal necrotic tissue which was removed.  No evidence of infection.  Continue to apply clean warm compresses at least 3 times a day.  Continue same wound care.  No need to re-assess if closing progressively without complication.  Follow-up annual gynecologic exam September 2020.  Krystal Kelley, it was a pleasure seeing you today!

## 2018-08-04 ENCOUNTER — Encounter: Payer: Self-pay | Admitting: Obstetrics & Gynecology

## 2018-08-23 ENCOUNTER — Encounter: Payer: Self-pay | Admitting: Podiatry

## 2018-08-23 ENCOUNTER — Other Ambulatory Visit: Payer: Self-pay

## 2018-08-23 ENCOUNTER — Ambulatory Visit (INDEPENDENT_AMBULATORY_CARE_PROVIDER_SITE_OTHER): Payer: Medicare Other | Admitting: Podiatry

## 2018-08-23 VITALS — Temp 97.2°F

## 2018-08-23 DIAGNOSIS — M722 Plantar fascial fibromatosis: Secondary | ICD-10-CM

## 2018-08-23 NOTE — Progress Notes (Signed)
She presents today chief complaint of painful heels bilaterally right greater than left.  Objective: Pulses remain palpable no open lesions or wounds she has pain on palpation medial calcaneal tubercle of the right heel.  Lesser degree on the left.  Assessment: Plantar fasciitis bilateral.  Plan: After sterile Betadine skin prep I injected 20 mg Kenalog 5 mg Marcaine for maximal tenderness bilateral heel.  She will follow-up with me on as-needed basis.

## 2018-09-05 NOTE — Assessment & Plan Note (Signed)
Right breast lumpectomy 06/18/2014: Invasive ductal carcinoma 1.3 cm negative for LVI; DCIS 1 mm from margins, 1 sentinel node negative, grade 3 ER 95%, PR 77%, HER-2 amplified ratio 8.25, Ki-67 43% T1 cN0 M0 stage IA  Treatment summary S/P Adjuvant chemo with Abraxane Herceptin started 07/18/2014 completed 10/03/14, Herceptin maintenance completed 07/03/2015 Status post adjuvant radiation completed 12/17/2014 Started tamoxifen 12/26/2014 ---------------------------------------------------------------------------- Current treatment:Tamoxifen 20 mg daily 12/26/2014  Tamoxifen toxicities:  1. Emotional changes especially crying spells and anxiety spells: Much improved Since she is seeing a therapist 2.Hot flashes. 3. Diffuse myalgias and arthralgias  Fatigue: Improving with activity Breast cancer surveillance: 1.  Breast exam 09/08/2017: Benign  2. mammogram 07/13/2016 at [facility]: Benign with breast density category A 12/30/2017: CT abdomen pelvis for left-sided abdominal pain: Fatty liver, uterine fibroid  Obesity: Patient gained 20 pounds because she had a foot surgery.  She just joined a gym and wants to lose weight. I will refer her to weight loss clinic. Leg swelling: Previously received Lasix Return to clinic in 1 year for follow-up

## 2018-09-07 ENCOUNTER — Telehealth: Payer: Self-pay | Admitting: Hematology and Oncology

## 2018-09-07 NOTE — Telephone Encounter (Signed)
Called patient regarding upcoming Webex appointment, per patient's request this will be a Doximity call.

## 2018-09-11 NOTE — Progress Notes (Signed)
HEMATOLOGY-ONCOLOGY DOXIMITY VISIT PROGRESS NOTE  I connected with Krystal Kelley on 09/12/2018 at 11:30 AM EDT by Doximity video conference and verified that I am speaking with the correct person using two identifiers.  I discussed the limitations, risks, security and privacy concerns of performing an evaluation and management service by Doximity and the availability of in person appointments.  I also discussed with the patient that there may be a patient responsible charge related to this service. The patient expressed understanding and agreed to proceed.  Patient's Location: Home Physician Location: Clinic  CHIEF COMPLIANT: Follow-up on tamoxifen therapy  INTERVAL HISTORY: Krystal Kelley is a 48 y.o. female with above-mentioned history of right breast cancer treated with lumpectomy, adjuvant chemotherapy with Herceptin, and radiation who is currently on tamoxifen. I last saw her a year ago. She underwent a hysterectomy with bilateral salpingo-oophorectomy on 05/22/18. She presents over Doximity today for annual follow-up. Heating pad related skin burns just healing. Lost 10 lbs with phenteramine.    Breast cancer of lower-inner quadrant of right female breast ([place])   05/14/2014 Mammogram    Right mammogram: 1.5 cm round mass in right breast at 3:00    05/14/2014 Breast US    Right breast: irregular, hypoechoic mass with indistinct margins at 3:00 mearying 1.1 x 0.8 x 1.3 cm.    05/16/2014 Initial Biopsy    Ultrasound-guided biopsy: Invasive ductal carcinoma with DCIS, grade 1-2, ER/PR positive, Ki-67 43%, HER-2 positive    05/28/2014 Breast MRI    1.4 x 1.2 x 1.0 cm biopsy-proven invasive ductal carcinoma and ductal carcinoma in situ in the lower inner quadrant of the right breast.     05/28/2014 Clinical Stage    Stage IA: T1c N0    06/13/2014 Procedure    Breast/Next: Variant of Unknown Significance in the ATM gene called p.4477C>T, p.L1493F. Otherwise, no clinically significant  mutation at ATM, BARD1, BRCA1, BRCA2, BRIP1, CDH1, CHEK2, MRE11A, MUTYH, NBN, NF1, PALB2, PTEN, RAD50, RAD51C, RAD51D, and TP53    06/18/2014 Surgery    Right breast lumpectomy: Invasive ductal carcinoma 1.3 cm negative for LVI; DCIS 1 mm from margins, 1 sentinel node negative, grade 3 ER 95%, PR 77%, HER-2 amplified ratio 8.25, Ki-67 43%    06/18/2014 Pathologic Stage    Stage IA: T1c N0 M0    07/18/2014 - 10/03/2014 Chemotherapy    Abraxane Herceptin weekly 12 followed by Herceptin maintenance every 3 weeks to complete one year completed 07/03/2015    10/30/2014 - 12/17/2014 Radiation Therapy    Adjuvant radiation therapy: Right breast 50.4 gray in 28 fractions, lumpectomy cavity boost 12 gray in 6 fractions    02/01/2015 -  Anti-estrogen oral therapy    Tamoxifen 20 mg daily     REVIEW OF SYSTEMS:   Constitutional: Denies fevers, chills or abnormal weight loss Eyes: Denies blurriness of vision Ears, nose, mouth, throat, and face: Denies mucositis or sore throat Respiratory: Denies cough, dyspnea or wheezes Cardiovascular: Denies palpitation, chest discomfort Gastrointestinal:  Healing from skin burns. Skin: Denies abnormal skin rashes Lymphatics: Denies new lymphadenopathy or easy bruising Neurological:Denies numbness, tingling or new weaknesses Behavioral/Psych: Mood is stable, no new changes  Extremities: No lower extremity edema Breast: denies any pain or lumps or nodules in either breasts All other systems were reviewed with the patient and are negative.  Observations/Objective:  There were no vitals filed for this visit. There is no height or weight on file to calculate BMI.  I have reviewed the data  as listed CMP Latest Ref Rng & Units 01/06/2018 12/29/2017 09/08/2017  Glucose 65 - 99 mg/dL 97 103(H) 90  BUN 7 - 25 mg/dL '11 12 14  ' Creatinine 0.50 - 1.10 mg/dL 0.89 0.92 0.94  Sodium 135 - 146 mmol/L 140 141 141  Potassium 3.5 - 5.3 mmol/L 3.9 3.4(L) 4.1  Chloride 98 - 110  mmol/L 105 103 106  CO2 20 - 32 mmol/L '27 30 29  ' Calcium 8.6 - 10.2 mg/dL 9.4 8.9 9.0  Total Protein 6.1 - 8.1 g/dL 6.8 7.5 7.3  Total Bilirubin 0.2 - 1.2 mg/dL 0.3 0.4 0.3  Alkaline Phos 38 - 126 U/L - 49 53  AST 10 - 35 U/L '24 30 30  ' ALT 6 - 29 U/L '24 29 30    ' Lab Results  Component Value Date   WBC 15.0 (H) 05/23/2018   HGB 9.9 (L) 05/23/2018   HCT 30.7 (L) 05/23/2018   MCV 97.8 05/23/2018   PLT 235 05/23/2018   NEUTROABS 4.5 09/08/2017      Assessment Plan:  Breast cancer of lower-inner quadrant of right female breast Right breast lumpectomy 06/18/2014: Invasive ductal carcinoma 1.3 cm negative for LVI; DCIS 1 mm from margins, 1 sentinel node negative, grade 3 ER 95%, PR 77%, HER-2 amplified ratio 8.25, Ki-67 43% T1 cN0 M0 stage IA  Treatment summary S/P Adjuvant chemo with Abraxane Herceptin started 07/18/2014 completed 10/03/14, Herceptin maintenance completed 07/03/2015 Status post adjuvant radiation completed 12/17/2014 Started tamoxifen 12/2014 ---------------------------------------------------------------------------- Current treatment:Tamoxifen 20 mg daily 12/2014 switched to anastrozole 09/12/2018 since she had hysterectomy  Tamoxifen toxicities:  1. Emotional changes especially crying spells and anxiety spells: Much improved Since she is seeing a therapist 2.Hot flashes. 3. Diffuse myalgias and arthralgias  Because she had a hysterectomy, I recommended switching her from tamoxifen to anastrozole. Anastrozole counseling:We discussed the risks and benefits of anti-estrogen therapy with aromatase inhibitors. These include but not limited to insomnia, hot flashes, mood changes, vaginal dryness, bone density loss, and weight gain. We strongly believe that the benefits far outweigh the risks. Patient understands these risks and consented to starting treatment. Planned treatment duration is 5 years.   Breast cancer surveillance: 1.  Breast exam 09/08/2017: Benign   2. mammogram 07/13/2016 at [facility]: Benign with breast density category A 12/30/2017: CT abdomen pelvis for left-sided abdominal pain: Fatty liver, uterine fibroid  Obesity: Patient tells me that she lost 10 pounds on phentermine I will call her in 1 month to see how she is tolerating anastrozole therapy.   I discussed the assessment and treatment plan with the patient. The patient was provided an opportunity to ask questions and all were answered. The patient agreed with the plan and demonstrated an understanding of the instructions. The patient was advised to call back or seek an in-person evaluation if the symptoms worsen or if the condition fails to improve as anticipated.   I provided 15 minutes of face-to-face Doximity time during this encounter.    Rulon Eisenmenger, MD 09/12/2018   I, Molly Dorshimer, am acting as scribe for Nicholas Lose, MD.  I have reviewed the above documentation for accuracy and completeness, and I agree with the above.

## 2018-09-12 ENCOUNTER — Other Ambulatory Visit: Payer: Self-pay

## 2018-09-12 ENCOUNTER — Inpatient Hospital Stay: Payer: Medicare Other | Attending: Hematology and Oncology | Admitting: Hematology and Oncology

## 2018-09-12 DIAGNOSIS — C50311 Malignant neoplasm of lower-inner quadrant of right female breast: Secondary | ICD-10-CM

## 2018-09-12 DIAGNOSIS — Z923 Personal history of irradiation: Secondary | ICD-10-CM

## 2018-09-12 DIAGNOSIS — Z79899 Other long term (current) drug therapy: Secondary | ICD-10-CM

## 2018-09-12 DIAGNOSIS — Z9221 Personal history of antineoplastic chemotherapy: Secondary | ICD-10-CM | POA: Diagnosis not present

## 2018-09-12 DIAGNOSIS — Z170 Estrogen receptor positive status [ER+]: Secondary | ICD-10-CM | POA: Diagnosis not present

## 2018-09-12 DIAGNOSIS — Z79810 Long term (current) use of selective estrogen receptor modulators (SERMs): Secondary | ICD-10-CM

## 2018-09-12 MED ORDER — ANASTROZOLE 1 MG PO TABS: 1.0000 mg | ORAL_TABLET | Freq: Every day | ORAL | 3 refills | Status: DC

## 2018-09-25 ENCOUNTER — Other Ambulatory Visit: Payer: Self-pay | Admitting: Chiropractic Medicine

## 2018-09-25 DIAGNOSIS — M5414 Radiculopathy, thoracic region: Secondary | ICD-10-CM

## 2018-09-27 ENCOUNTER — Ambulatory Visit
Admission: RE | Admit: 2018-09-27 | Discharge: 2018-09-27 | Disposition: A | Discharge status: Home / Self Care | Payer: Medicare Other | Source: Ambulatory Visit | Attending: Chiropractic Medicine | Admitting: Chiropractic Medicine

## 2018-09-27 DIAGNOSIS — M5414 Radiculopathy, thoracic region: Secondary | ICD-10-CM

## 2018-10-10 NOTE — Assessment & Plan Note (Signed)
Right breast lumpectomy 06/18/2014: Invasive ductal carcinoma 1.3 cm negative for LVI; DCIS 1 mm from margins, 1 sentinel node negative, grade 3 ER 95%, PR 77%, HER-2 amplified ratio 8.25, Ki-67 43% T1 cN0 M0 stage IA  Treatment summary S/P Adjuvant chemo with Abraxane Herceptin started 07/18/2014 completed 10/03/14, Herceptin maintenance completed 07/03/2015 Status post adjuvant radiation completed 12/17/2014 Started tamoxifen 12/2014 ---------------------------------------------------------------------------- Current treatment:Tamoxifen 20 mg daily 12/2014 switched to anastrozole 09/12/2018 since she had hysterectomy  Anastrozole toxicities:  Breast cancer surveillance: 1.Breast exam 09/08/2017: Benign  2.mammogram 07/13/2016 at [facility]: Benign with breast density categoryA 12/30/2017: CT abdomen pelvis for left-sided abdominal pain: Fatty liver, uterine fibroid  Obesity: Patient tells me that she lost 10 pounds on phentermine  Return to clinic in 6 months for follow-up and after that we can see her once a year

## 2018-10-13 ENCOUNTER — Telehealth: Payer: Self-pay | Admitting: Hematology and Oncology

## 2018-10-13 NOTE — Telephone Encounter (Signed)
Confirmed appt and verified info. °

## 2018-10-13 NOTE — Progress Notes (Signed)
HEMATOLOGY-ONCOLOGY DOXIMITY VISIT PROGRESS NOTE  I connected with Krystal Kelley on 10/16/2018 at 11:15 AM EDT by Doximity video conference and verified that I am speaking with the correct person using two identifiers.  I discussed the limitations, risks, security and privacy concerns of performing an evaluation and management service by Doximity and the availability of in person appointments.  I also discussed with the patient that there may be a patient responsible charge related to this service. The patient expressed understanding and agreed to proceed.  Patient's Location: Home Physician Location: Clinic  CHIEF COMPLIANT: Follow-up on anastrozole therapy  INTERVAL HISTORY: Krystal Kelley is a 48 y.o. female with above-mentioned history of right breast cancer treated with lumpectomy, adjuvant chemotherapy with Herceptin, and radiation who is currently on anastrozole. She presents over Doximity today for follow-up to discuss how she is tolerating anastrozole.   Oncology History  Breast cancer of lower-inner quadrant of right female breast ([place])  05/14/2014 Mammogram   Right mammogram: 1.5 cm round mass in right breast at 3:00   05/14/2014 Breast US   Right breast: irregular, hypoechoic mass with indistinct margins at 3:00 mearying 1.1 x 0.8 x 1.3 cm.   05/16/2014 Initial Biopsy   Ultrasound-guided biopsy: Invasive ductal carcinoma with DCIS, grade 1-2, ER/PR positive, Ki-67 43%, HER-2 positive   05/28/2014 Breast MRI   1.4 x 1.2 x 1.0 cm biopsy-proven invasive ductal carcinoma and ductal carcinoma in situ in the lower inner quadrant of the right breast.    05/28/2014 Clinical Stage   Stage IA: T1c N0   06/13/2014 Procedure   Breast/Next: Variant of Unknown Significance in the ATM gene called p.4477C>T, p.L1493F. Otherwise, no clinically significant mutation at ATM, BARD1, BRCA1, BRCA2, BRIP1, CDH1, CHEK2, MRE11A, MUTYH, NBN, NF1, PALB2, PTEN, RAD50, RAD51C, RAD51D, and TP53    06/18/2014 Surgery   Right breast lumpectomy: Invasive ductal carcinoma 1.3 cm negative for LVI; DCIS 1 mm from margins, 1 sentinel node negative, grade 3 ER 95%, PR 77%, HER-2 amplified ratio 8.25, Ki-67 43%   06/18/2014 Pathologic Stage   Stage IA: T1c N0 M0   07/18/2014 - 10/03/2014 Chemotherapy   Abraxane Herceptin weekly 12 followed by Herceptin maintenance every 3 weeks to complete one year completed 07/03/2015   10/30/2014 - 12/17/2014 Radiation Therapy   Adjuvant radiation therapy: Right breast 50.4 gray in 28 fractions, lumpectomy cavity boost 12 gray in 6 fractions   02/01/2015 -  Anti-estrogen oral therapy   Tamoxifen 20 mg daily, switched to anastrozole on 09/12/18 after hysterectomy      REVIEW OF SYSTEMS:   Constitutional: Denies fevers, chills or abnormal weight loss Eyes: Denies blurriness of vision Ears, nose, mouth, throat, and face: Denies mucositis or sore throat Respiratory: Denies cough, dyspnea or wheezes Cardiovascular: Denies palpitation, chest discomfort Gastrointestinal:  Denies nausea, heartburn or change in bowel habits Skin: Denies abnormal skin rashes Lymphatics: Denies new lymphadenopathy or easy bruising Neurological:Denies numbness, tingling or new weaknesses Behavioral/Psych: Mood is stable, no new changes  Extremities: No lower extremity edema Breast: denies any pain or lumps or nodules in either breasts All other systems were reviewed with the patient and are negative.  Observations/Objective:  There were no vitals filed for this visit. There is no height or weight on file to calculate BMI.  I have reviewed the data as listed CMP Latest Ref Rng & Units 01/06/2018 12/29/2017 09/08/2017  Glucose 65 - 99 mg/dL 97 103(H) 90  BUN 7 - 25 mg/dL 11 12 14  Creatinine 0.50 - 1.10 mg/dL 0.89 0.92 0.94  Sodium 135 - 146 mmol/L 140 141 141  Potassium 3.5 - 5.3 mmol/L 3.9 3.4(L) 4.1  Chloride 98 - 110 mmol/L 105 103 106  CO2 20 - 32 mmol/L _0 Calcium  8.6 - 10.2 mg/dL 9.4 8.9 9.0  Total Protein 6.1 - 8.1 g/dL 6.8 7.5 7.3  Total Bilirubin 0.2 - 1.2 mg/dL 0.3 0.4 0.3  Alkaline Phos 38 - 126 U/L - 49 53  AST 10 - 35 U/L _1 ALT 6 - 29 U/L _2 Lab Results  Component Value Date   WBC 15.0 (H) 05/23/2018   HGB 9.9 (L) 05/23/2018   HCT 30.7 (L) 05/23/2018   MCV 97.8 05/23/2018   PLT 235 05/23/2018   NEUTROABS 4.5 09/08/2017      Assessment Plan:  Breast cancer of lower-inner quadrant of right female breast Right breast lumpectomy 06/18/2014: Invasive ductal carcinoma 1.3 cm negative for LVI; DCIS 1 mm from margins, 1 sentinel node negative, grade 3 ER 95%, PR 77%, HER-2 amplified ratio 8.25, Ki-67 43% T1 cN0 M0 stage IA  Treatment summary S/P Adjuvant chemo with Abraxane Herceptin started 07/18/2014 completed 10/03/14, Herceptin maintenance completed 07/03/2015 Status post adjuvant radiation completed 12/17/2014 Started tamoxifen 12/2014 ---------------------------------------------------------------------------- Current treatment:Tamoxifen 20 mg daily 12/2014 switched to anastrozole 09/12/2018 since she had hysterectomy  Anastrozole toxicities: Occ hot flashes  Breast cancer surveillance: 1.Breast exam 09/08/2017: Benign  2.mammogram 07/13/2016 at [facility]: Benign with breast density categoryA 12/30/2017: CT abdomen pelvis for left-sided abdominal pain: Fatty liver, uterine fibroid  Obesity: Patient tells me that she lost 10 pounds on phentermine  Return to clinic in 6 months for follow-up and after that we can see her once a year  I discussed the assessment and treatment plan with the patient. The patient was provided an opportunity to ask questions and all were answered. The patient agreed with the plan and demonstrated an understanding of the instructions. The patient was advised to call back or seek an in-person evaluation if the symptoms worsen or if the condition fails to improve as anticipated.   I  provided 15 minutes of face-to-face Doximity time during this encounter.    Rulon Eisenmenger, MD 10/16/2018   I, Molly Dorshimer, am acting as scribe for Nicholas Lose, MD.  I have reviewed the above documentation for accuracy and completeness, and I agree with the above.

## 2018-10-16 ENCOUNTER — Inpatient Hospital Stay: Payer: Medicare Other | Attending: Hematology and Oncology | Admitting: Hematology and Oncology

## 2018-10-16 DIAGNOSIS — Z170 Estrogen receptor positive status [ER+]: Secondary | ICD-10-CM

## 2018-10-16 DIAGNOSIS — Z9221 Personal history of antineoplastic chemotherapy: Secondary | ICD-10-CM | POA: Diagnosis not present

## 2018-10-16 DIAGNOSIS — C50311 Malignant neoplasm of lower-inner quadrant of right female breast: Secondary | ICD-10-CM | POA: Diagnosis not present

## 2018-10-16 DIAGNOSIS — Z923 Personal history of irradiation: Secondary | ICD-10-CM | POA: Diagnosis not present

## 2018-10-16 DIAGNOSIS — Z79810 Long term (current) use of selective estrogen receptor modulators (SERMs): Secondary | ICD-10-CM

## 2018-10-17 ENCOUNTER — Telehealth: Payer: Self-pay | Admitting: Hematology and Oncology

## 2018-10-17 NOTE — Telephone Encounter (Signed)
I left a message regarding visit °

## 2018-10-31 ENCOUNTER — Other Ambulatory Visit: Payer: Self-pay

## 2018-11-02 ENCOUNTER — Encounter: Payer: Self-pay | Admitting: Obstetrics & Gynecology

## 2018-11-02 ENCOUNTER — Ambulatory Visit (INDEPENDENT_AMBULATORY_CARE_PROVIDER_SITE_OTHER): Payer: Medicare Other | Admitting: Obstetrics & Gynecology

## 2018-11-02 ENCOUNTER — Other Ambulatory Visit: Payer: Self-pay

## 2018-11-02 VITALS — BP 136/88 | Ht 63.5 in | Wt 262.0 lb

## 2018-11-02 DIAGNOSIS — Z6841 Body Mass Index (BMI) 40.0 and over, adult: Secondary | ICD-10-CM

## 2018-11-02 DIAGNOSIS — E66813 Obesity, class 3: Secondary | ICD-10-CM

## 2018-11-02 DIAGNOSIS — Z713 Dietary counseling and surveillance: Secondary | ICD-10-CM | POA: Diagnosis not present

## 2018-11-02 MED ORDER — PHENTERMINE HCL 37.5 MG PO CAPS: 37.5000 mg | ORAL_CAPSULE | ORAL | 2 refills | Status: DC

## 2018-11-02 NOTE — Progress Notes (Signed)
    Krystal Kelley 10-06-1970 [id number]        48 y.o.  G0P0000 G0 Single  RP: Weight loss counseling  HPI: On Phentermine x 08/02/2018.  Weight was 271 at that time, now down to 262.  Erratic eating habits.  Not on a weight loss nutrition plan.  Having a hard time exercising because of hip pains possibly a side effect of tamoxifen.  Breast cancer, switch from tamoxifen to Arimidex after TAH/BSO in February 2020.   OB History  Gravida Para Term Preterm AB Living  0 0 0 0 0 0  SAB TAB Ectopic Multiple Live Births  0 0 0 0 0    Past medical history,surgical history, problem list, medications, allergies, family history and social history were all reviewed and documented in the EPIC chart.   Directed ROS with pertinent positives and negatives documented in the history of present illness/assessment and plan.  Exam:  Vitals:   11/02/18 1119  BP: 136/88  Weight: 262 lb (118.8 kg)  Height: 5' 3.5" (1.613 m)  BMI 45.7  General appearance:  Normal   Assessment/Plan:  48 y.o. G0P0000   1. Weight loss counseling, encounter for Weight loss of 9 pounds in 3 months on phentermine.  Recommend a lower calorie/carb diet such as [employer] and intermittent fasting.  Recommend aerobic physical activities 5 times a week and weightlifting every 2 days.  Patient is considering buying a stationary bike.  Will continue on phentermine and patient will follow-up in 3 months.  2. Class 3 severe obesity due to excess calories without serious comorbidity with body mass index (BMI) of 45.0 to 49.9 in adult [facility]) As above.  Other orders - phentermine 37.5 MG capsule; Take 1 capsule (37.5 mg total) by mouth every morning.  Counseling on above issues and coordination of care more than 50% for 25 minutes.  [name] MD, 11:45 AM 11/02/2018

## 2018-11-02 NOTE — Patient Instructions (Signed)
1. Weight loss counseling, encounter for Weight loss of 9 pounds in 3 months on phentermine.  Recommend a lower calorie/carb diet such as [employer] and intermittent fasting.  Recommend aerobic physical activities 5 times a week and weightlifting every 2 days.  Patient is considering buying a stationary bike.  Will continue on phentermine and patient will follow-up in 3 months.  2. Class 3 severe obesity due to excess calories without serious comorbidity with body mass index (BMI) of 45.0 to 49.9 in adult [facility]) As above.  Other orders - phentermine 37.5 MG capsule; Take 1 capsule (37.5 mg total) by mouth every morning.  Krystal Kelley, it was a pleasure seeing you today!

## 2019-01-09 ENCOUNTER — Other Ambulatory Visit: Payer: Self-pay

## 2019-01-10 ENCOUNTER — Ambulatory Visit (INDEPENDENT_AMBULATORY_CARE_PROVIDER_SITE_OTHER): Payer: Medicare Other | Admitting: Podiatry

## 2019-01-10 ENCOUNTER — Encounter: Payer: Self-pay | Admitting: Podiatry

## 2019-01-10 ENCOUNTER — Ambulatory Visit (INDEPENDENT_AMBULATORY_CARE_PROVIDER_SITE_OTHER): Payer: Medicare Other | Admitting: Obstetrics & Gynecology

## 2019-01-10 ENCOUNTER — Encounter: Payer: Self-pay | Admitting: Obstetrics & Gynecology

## 2019-01-10 VITALS — BP 130/82 | Ht 63.5 in | Wt 260.0 lb

## 2019-01-10 DIAGNOSIS — C50311 Malignant neoplasm of lower-inner quadrant of right female breast: Secondary | ICD-10-CM

## 2019-01-10 DIAGNOSIS — Z01419 Encounter for gynecological examination (general) (routine) without abnormal findings: Secondary | ICD-10-CM

## 2019-01-10 DIAGNOSIS — Z90710 Acquired absence of both cervix and uterus: Secondary | ICD-10-CM

## 2019-01-10 DIAGNOSIS — Z853 Personal history of malignant neoplasm of breast: Secondary | ICD-10-CM

## 2019-01-10 DIAGNOSIS — Z6841 Body Mass Index (BMI) 40.0 and over, adult: Secondary | ICD-10-CM

## 2019-01-10 DIAGNOSIS — Z170 Estrogen receptor positive status [ER+]: Secondary | ICD-10-CM | POA: Diagnosis not present

## 2019-01-10 DIAGNOSIS — Z1151 Encounter for screening for human papillomavirus (HPV): Secondary | ICD-10-CM

## 2019-01-10 DIAGNOSIS — Z113 Encounter for screening for infections with a predominantly sexual mode of transmission: Secondary | ICD-10-CM

## 2019-01-10 DIAGNOSIS — M722 Plantar fascial fibromatosis: Secondary | ICD-10-CM | POA: Diagnosis not present

## 2019-01-10 DIAGNOSIS — Z9079 Acquired absence of other genital organ(s): Secondary | ICD-10-CM

## 2019-01-10 DIAGNOSIS — E66813 Obesity, class 3: Secondary | ICD-10-CM

## 2019-01-10 NOTE — Progress Notes (Signed)
She presents today for follow-up of plantar fasciitis states that I need injections bilateral.  Objective: Vital signs are stable alert and oriented x3.  Pulses are palpable.  She has pain to palpation medial calcaneal tubercles bilateral.  Assessment: Chronic intractable plantar fasciitis.  Plan: I injected her with 10 mg Kenalog 5 mg Marcaine point of maximal tenderness bilateral heels.  Follow procedure well no complications.  Follow-up with her as needed

## 2019-01-10 NOTE — Progress Notes (Signed)
Krystal Kelley Jun 02, 1970 [id number]   History:    48 y.o. G0 Single  RP: Established patient presenting for annual gyn exam   HPI: S/P TAH/BSO in 05/2018.  No pelvic pain.  Had IC using condoms recently.  H/O Rt Breast Cancer on Anastrazole.  Stopped Tamoxifen which she took from 2016/2020.  Breasts normal.  Urine/BMs normal.  BMI 45.33.  Low calorie diet/increased physical activity on Phentermine.  Lost 2 Lbs x mid July 2020.  Health labs here today.  Past medical history,surgical history, family history and social history were all reviewed and documented in the EPIC chart.  Gynecologic History No LMP recorded (lmp unknown). Patient has had a hysterectomy. Contraception: status post hysterectomy Last Pap: 12/2017 Results were: Negative.  Patho Cervix Benign 05/2018. Last mammogram: 07/2018. Results were: Negative Bone Density: Never Colonoscopy: Never  Obstetric History OB History  Gravida Para Term Preterm AB Living  0 0 0 0 0 0  SAB TAB Ectopic Multiple Live Births  0 0 0 0 0     ROS: A ROS was performed and pertinent positives and negatives are included in the history.  GENERAL: No fevers or chills. HEENT: No change in vision, no earache, sore throat or sinus congestion. NECK: No pain or stiffness. CARDIOVASCULAR: No chest pain or pressure. No palpitations. PULMONARY: No shortness of breath, cough or wheeze. GASTROINTESTINAL: No abdominal pain, nausea, vomiting or diarrhea, melena or bright red blood per rectum. GENITOURINARY: No urinary frequency, urgency, hesitancy or dysuria. MUSCULOSKELETAL: No joint or muscle pain, no back pain, no recent trauma. DERMATOLOGIC: No rash, no itching, no lesions. ENDOCRINE: No polyuria, polydipsia, no heat or cold intolerance. No recent change in weight. HEMATOLOGICAL: No anemia or easy bruising or bleeding. NEUROLOGIC: No headache, seizures, numbness, tingling or weakness. PSYCHIATRIC: No depression, no loss of interest in normal activity or  change in sleep pattern.     Exam:   BP 130/82   Ht 5' 3.5" (1.613 m)   Wt 260 lb (117.9 kg)   LMP  (LMP Unknown)   BMI 45.33 kg/m   Body mass index is 45.33 kg/m.  General appearance : Well developed well nourished female. No acute distress HEENT: Eyes: no retinal hemorrhage or exudates,  Neck supple, trachea midline, no carotid bruits, no thyroidmegaly Lungs: Clear to auscultation, no rhonchi or wheezes, or rib retractions  Heart: Regular rate and rhythm, no murmurs or gallops Breast:Examined in sitting and supine position were symmetrical in appearance, no palpable masses or tenderness,  no skin retraction, no nipple inversion, no nipple discharge, no skin discoloration, no axillary or supraclavicular lymphadenopathy Abdomen: no palpable masses or tenderness, no rebound or guarding Extremities: no edema or skin discoloration or tenderness  Pelvic: Vulva: Normal             Vagina: No gross lesions or discharge.  Cervix/Uterus absent  Adnexa  Without masses or tenderness  Anus: Normal   Assessment/Plan:  48 y.o. female for annual exam   1. Encounter for Papanicolaou smear of vagina as part of routine gynecological examination Gynecologic exam status post TAH/BSO.  Pathology of the cervix February 2020 was benign.  No indication to do a Pap test this year.  Breast exam normal.  History of right breast cancer.  Mammogram April 2020 was negative.  Health labs here today. - CBC - Comp Met (CMET) - Lipid panel - TSH - VITAMIN D 25 Hydroxy (Vit-D Deficiency, Fractures)  2. S/P TAH-BSO  3. Malignant neoplasm of  lower-inner quadrant of right breast of female, estrogen receptor positive ([place]) Right breast cancer on Anastrazole.  4. Screen for STD (sexually transmitted disease) Strongly recommend strict condom use. - HIV antibody (with reflex) - RPR - Hepatitis C Antibody - Hepatitis B Surface AntiGEN  5. Class 3 severe obesity due to excess calories without serious  comorbidity with body mass index (BMI) of 45.0 to 49.9 in adult [facility]) On phentermine.  Lower calorie/carb diet such as [employer] recommended.  Aerobic physical activities 5 times a week and weightlifting every 2 days.  [name] MD, 12:18 PM 01/10/2019

## 2019-01-11 LAB — LIPID PANEL
Cholesterol: 189 mg/dL (ref <200)
HDL: 74 mg/dL (ref >=50)
LDL Cholesterol (Calc): 99 mg/dL (calc)
Non-HDL Cholesterol (Calc): 115 mg/dL (calc) (ref <130)
Total CHOL/HDL Ratio: 2.6 (calc) (ref <5.0)
Triglycerides: 72 mg/dL (ref <150)

## 2019-01-11 LAB — CBC
HCT: 39.4 % (ref 35.0–45.0)
Hemoglobin: 13.2 g/dL (ref 11.7–15.5)
MCH: 30.8 pg (ref 27.0–33.0)
MCHC: 33.5 g/dL (ref 32.0–36.0)
MCV: 91.8 fL (ref 80.0–100.0)
MPV: 10 fL (ref 7.5–12.5)
Platelets: 353 10*3/uL (ref 140–400)
RBC: 4.29 10*6/uL (ref 3.80–5.10)
RDW: 13.1 % (ref 11.0–15.0)
WBC: 7 10*3/uL (ref 3.8–10.8)

## 2019-01-11 LAB — COMPREHENSIVE METABOLIC PANEL
AG Ratio: 1.3 (calc) (ref 1.0–2.5)
ALT: 38 U/L — ABNORMAL HIGH (ref 6–29)
AST: 38 U/L — ABNORMAL HIGH (ref 10–35)
Albumin: 4.4 g/dL (ref 3.6–5.1)
Alkaline phosphatase (APISO): 72 U/L (ref 31–125)
BUN: 12 mg/dL (ref 7–25)
CO2: 29 mmol/L (ref 20–32)
Calcium: 9.5 mg/dL (ref 8.6–10.2)
Chloride: 101 mmol/L (ref 98–110)
Creat: 1.02 mg/dL (ref 0.50–1.10)
Globulin: 3.4 g/dL (calc) (ref 1.9–3.7)
Glucose, Bld: 96 mg/dL (ref 65–99)
Potassium: 3.6 mmol/L (ref 3.5–5.3)
Sodium: 141 mmol/L (ref 135–146)
Total Bilirubin: 0.4 mg/dL (ref 0.2–1.2)
Total Protein: 7.8 g/dL (ref 6.1–8.1)

## 2019-01-11 LAB — HIV ANTIBODY (ROUTINE TESTING W REFLEX): HIV 1&2 Ab, 4th Generation: NONREACTIVE

## 2019-01-11 LAB — HEPATITIS C ANTIBODY
Hepatitis C Ab: NONREACTIVE
SIGNAL TO CUT-OFF: 0.02 (ref <1.00)

## 2019-01-11 LAB — RPR: RPR Ser Ql: NONREACTIVE

## 2019-01-11 LAB — TSH: TSH: 1.45 mIU/L

## 2019-01-11 LAB — VITAMIN D 25 HYDROXY (VIT D DEFICIENCY, FRACTURES): Vit D, 25-Hydroxy: 20 ng/mL — ABNORMAL LOW (ref 30–100)

## 2019-01-11 LAB — HEPATITIS B SURFACE ANTIGEN: Hepatitis B Surface Ag: NONREACTIVE

## 2019-01-12 LAB — PAP IG, CT-NG NAA, HPV HIGH-RISK
C. trachomatis RNA, TMA: NOT DETECTED
HPV DNA High Risk: NOT DETECTED
N. gonorrhoeae RNA, TMA: NOT DETECTED

## 2019-01-13 ENCOUNTER — Encounter: Payer: Self-pay | Admitting: Obstetrics & Gynecology

## 2019-01-13 NOTE — Patient Instructions (Signed)
1. Encounter for Papanicolaou smear of vagina as part of routine gynecological examination Gynecologic exam status post TAH/BSO.  Pathology of the cervix February 2020 was benign.  No indication to do a Pap test this year.  Breast exam normal.  History of right breast cancer.  Mammogram April 2020 was negative.  Health labs here today. - CBC - Comp Met (CMET) - Lipid panel - TSH - VITAMIN D 25 Hydroxy (Vit-D Deficiency, Fractures)  2. S/P TAH-BSO  3. Malignant neoplasm of lower-inner quadrant of right breast of female, estrogen receptor positive ([place]) Right breast cancer on Anastrazole.  4. Screen for STD (sexually transmitted disease) Strongly recommend strict condom use. - HIV antibody (with reflex) - RPR - Hepatitis C Antibody - Hepatitis B Surface AntiGEN  5. Class 3 severe obesity due to excess calories without serious comorbidity with body mass index (BMI) of 45.0 to 49.9 in adult [facility]) On phentermine.  Lower calorie/carb diet such as [employer] recommended.  Aerobic physical activities 5 times a week and weightlifting every 2 days.  Krystal Kelley, it was a pleasure seeing you today!  I will inform you of your results as soon as they are available.

## 2019-01-23 ENCOUNTER — Other Ambulatory Visit: Payer: Medicare Other

## 2019-01-24 DIAGNOSIS — M65331 Trigger finger, right middle finger: Secondary | ICD-10-CM | POA: Insufficient documentation

## 2019-04-16 NOTE — Progress Notes (Signed)
HEMATOLOGY-ONCOLOGY DOXIMITY VISIT PROGRESS NOTE  I connected with Krystal Kelley on 04/17/2019 at  9:15 AM EST by Doximity video conference and verified that I am speaking with the correct person using two identifiers.  I discussed the limitations, risks, security and privacy concerns of performing an evaluation and management service by Doximity and the availability of in person appointments.  I also discussed with the patient that there may be a patient responsible charge related to this service. The patient expressed understanding and agreed to proceed.  Patient's Location: Home Physician Location: Clinic  CHIEF COMPLIANT: Follow-up of right breast cancer on anastrozole   INTERVAL HISTORY: Krystal Kelley is a 48 y.o. female with above-mentioned history of right breast cancer treated with lumpectomy,adjuvant chemotherapy with Herceptin, andradiation whois currently on anastrozole. She presents over Doximity today for follow-up.   Oncology History  Breast cancer of lower-inner quadrant of right female breast ([place])  05/14/2014 Mammogram   Right mammogram: 1.5 cm round mass in right breast at 3:00   05/14/2014 Breast US   Right breast: irregular, hypoechoic mass with indistinct margins at 3:00 mearying 1.1 x 0.8 x 1.3 cm.   05/16/2014 Initial Biopsy   Ultrasound-guided biopsy: Invasive ductal carcinoma with DCIS, grade 1-2, ER/PR positive, Ki-67 43%, HER-2 positive   05/28/2014 Breast MRI   1.4 x 1.2 x 1.0 cm biopsy-proven invasive ductal carcinoma and ductal carcinoma in situ in the lower inner quadrant of the right breast.    05/28/2014 Clinical Stage   Stage IA: T1c N0   06/13/2014 Procedure   Breast/Next: Variant of Unknown Significance in the ATM gene called p.4477C>T, p.L1493F. Otherwise, no clinically significant mutation at ATM, BARD1, BRCA1, BRCA2, BRIP1, CDH1, CHEK2, MRE11A, MUTYH, NBN, NF1, PALB2, PTEN, RAD50, RAD51C, RAD51D, and TP53   06/18/2014 Surgery   Right breast  lumpectomy: Invasive ductal carcinoma 1.3 cm negative for LVI; DCIS 1 mm from margins, 1 sentinel node negative, grade 3 ER 95%, PR 77%, HER-2 amplified ratio 8.25, Ki-67 43%   06/18/2014 Pathologic Stage   Stage IA: T1c N0 M0   07/18/2014 - 10/03/2014 Chemotherapy   Abraxane Herceptin weekly 12 followed by Herceptin maintenance every 3 weeks to complete one year completed 07/03/2015   10/30/2014 - 12/17/2014 Radiation Therapy   Adjuvant radiation therapy: Right breast 50.4 gray in 28 fractions, lumpectomy cavity boost 12 gray in 6 fractions   02/01/2015 -  Anti-estrogen oral therapy   Tamoxifen 20 mg daily, switched to anastrozole on 09/12/18 after hysterectomy      REVIEW OF SYSTEMS:   Constitutional: Denies fevers, chills or abnormal weight loss Eyes: Denies blurriness of vision Ears, nose, mouth, throat, and face: Denies mucositis or sore throat Respiratory: Denies cough, dyspnea or wheezes Cardiovascular: Denies palpitation, chest discomfort Gastrointestinal:  Denies nausea, heartburn or change in bowel habits Skin: Denies abnormal skin rashes Lymphatics: Denies new lymphadenopathy or easy bruising Neurological:Denies numbness, tingling or new weaknesses Behavioral/Psych: Mood is stable, no new changes  Extremities: No lower extremity edema Breast: denies any pain or lumps or nodules in either breasts All other systems were reviewed with the patient and are negative.  Observations/Objective:  There were no vitals filed for this visit. There is no height or weight on file to calculate BMI.  I have reviewed the data as listed CMP Latest Ref Rng & Units 01/10/2019 01/06/2018 12/29/2017  Glucose 65 - 99 mg/dL 96 97 103(H)  BUN 7 - 25 mg/dL _0 Creatinine 0.50 - 1.10 mg/dL  1.02 0.89 0.92  Sodium 135 - 146 mmol/L 141 140 141  Potassium 3.5 - 5.3 mmol/L 3.6 3.9 3.4(L)  Chloride 98 - 110 mmol/L 101 105 103  CO2 20 - 32 mmol/L _0 Calcium 8.6 - 10.2 mg/dL 9.5 9.4 8.9  Total  Protein 6.1 - 8.1 g/dL 7.8 6.8 7.5  Total Bilirubin 0.2 - 1.2 mg/dL 0.4 0.3 0.4  Alkaline Phos 38 - 126 U/L - - 49  AST 10 - 35 U/L 38(H) 24 30  ALT 6 - 29 U/L 38(H) 24 29    Lab Results  Component Value Date   WBC 7.0 01/10/2019   HGB 13.2 01/10/2019   HCT 39.4 01/10/2019   MCV 91.8 01/10/2019   PLT 353 01/10/2019   NEUTROABS 4.5 09/08/2017      Assessment Plan:  Breast cancer of lower-inner quadrant of right female breast Right breast lumpectomy 06/18/2014: Invasive ductal carcinoma 1.3 cm negative for LVI; DCIS 1 mm from margins, 1 sentinel node negative, grade 3 ER 95%, PR 77%, HER-2 amplified ratio 8.25, Ki-67 43% T1 cN0 M0 stage IA  Treatment summary S/P Adjuvant chemo with Abraxane Herceptin started 07/18/2014 completed 10/03/14, Herceptin maintenance completed 07/03/2015 Status post adjuvant radiation completed 12/17/2014 Started tamoxifen09/2016 ---------------------------------------------------------------------------- Current treatment:Tamoxifen 20 mg daily09/2016switched to anastrozole 09/12/2018 since she had hysterectomy  Anastrozole toxicities: Occ hot flashes  Breast cancer surveillance: 1.Breast exam 04/17/2019: Benign  2.mammogram 07/27/2018 at [facility]: Benign with breast density categoryA 12/30/2017: CT abdomen pelvis for left-sided abdominal pain: Fatty liver, uterine fibroid  Obesity:Gained weight because she is not watching her diet.  I instructed her that this should be her goal for 2021. I instructed her to get the Covid vaccine when it becomes available.  Return to clinic in 1 year for follow-up    I discussed the assessment and treatment plan with the patient. The patient was provided an opportunity to ask questions and all were answered. The patient agreed with the plan and demonstrated an understanding of the instructions. The patient was advised to call back or seek an in-person evaluation if the symptoms worsen or if the condition fails  to improve as anticipated.   I provided 15 minutes of face-to-face Doximity time during this encounter.    Rulon Eisenmenger, MD 04/17/2019   I, Molly Dorshimer, am acting as scribe for Nicholas Lose, MD.  I have reviewed the above document for accuracy and completeness, and I agree with the above.

## 2019-04-17 ENCOUNTER — Inpatient Hospital Stay: Payer: Medicare Other | Attending: Hematology and Oncology | Admitting: Hematology and Oncology

## 2019-04-17 DIAGNOSIS — C50311 Malignant neoplasm of lower-inner quadrant of right female breast: Secondary | ICD-10-CM | POA: Diagnosis not present

## 2019-04-17 DIAGNOSIS — Z170 Estrogen receptor positive status [ER+]: Secondary | ICD-10-CM

## 2019-04-17 MED ORDER — ANASTROZOLE 1 MG PO TABS: 1.0000 mg | ORAL_TABLET | Freq: Every day | ORAL | 3 refills | Status: DC

## 2019-04-17 NOTE — Assessment & Plan Note (Signed)
Right breast lumpectomy 06/18/2014: Invasive ductal carcinoma 1.3 cm negative for LVI; DCIS 1 mm from margins, 1 sentinel node negative, grade 3 ER 95%, PR 77%, HER-2 amplified ratio 8.25, Ki-67 43% T1 cN0 M0 stage IA  Treatment summary S/P Adjuvant chemo with Abraxane Herceptin started 07/18/2014 completed 10/03/14, Herceptin maintenance completed 07/03/2015 Status post adjuvant radiation completed 12/17/2014 Started tamoxifen09/2016 ---------------------------------------------------------------------------- Current treatment:Tamoxifen 20 mg daily09/2016switched to anastrozole 09/12/2018 since she had hysterectomy  Anastrozole toxicities: Occ hot flashes  Breast cancer surveillance: 1.Breast exam 04/17/2019: Benign  2.mammogram 07/27/2018 at [facility]: Benign with breast density categoryA 12/30/2017: CT abdomen pelvis for left-sided abdominal pain: Fatty liver, uterine fibroid  Obesity:Patient tells me that she lost 10 pounds on phentermine  Return to clinic in 1 year for follow-up

## 2019-04-23 ENCOUNTER — Telehealth: Payer: Self-pay | Admitting: Hematology and Oncology

## 2019-04-23 NOTE — Telephone Encounter (Signed)
Scheduled per 12/29 los, patient has been notified.

## 2019-05-09 ENCOUNTER — Encounter: Payer: Self-pay | Admitting: Podiatry

## 2019-05-09 ENCOUNTER — Ambulatory Visit (INDEPENDENT_AMBULATORY_CARE_PROVIDER_SITE_OTHER): Payer: Medicare Other | Admitting: Podiatry

## 2019-05-09 ENCOUNTER — Other Ambulatory Visit: Payer: Self-pay

## 2019-05-09 DIAGNOSIS — M722 Plantar fascial fibromatosis: Secondary | ICD-10-CM

## 2019-05-09 NOTE — Progress Notes (Signed)
She presents today for follow-up of her plantar fasciitis states that they started hurting again around Christmas more than likely because she did a lot of extra shopping for charity.  She is actually requesting injections to the bilateral heels.  Objective: Vital signs are stable she is alert oriented x3.  Pulses are palpable.  Neurologic sensorium is intact deep tendon flexes are intact muscle strength is normal symmetrical.  She has plantar fasciitis with pain on palpation medial located tubercle.  Assessment: Plantar fasciitis bilateral right greater than left.  Flatfoot deformity bilateral.  Plan: Discussed etiology pathology and surgical therapy at this point time went ahead and performed injections to the bilateral heels 20 mg Kenalog 5 mg Marcaine point maximal tenderness bilateral heels.  Follow-up with her in about 6 weeks.

## 2019-06-01 ENCOUNTER — Other Ambulatory Visit: Payer: Self-pay | Admitting: Hematology and Oncology

## 2019-06-01 DIAGNOSIS — C50311 Malignant neoplasm of lower-inner quadrant of right female breast: Secondary | ICD-10-CM

## 2019-06-12 ENCOUNTER — Telehealth: Payer: Self-pay

## 2019-06-12 NOTE — Telephone Encounter (Signed)
Has ins form for [place] to fill out with lab results and BP.  To drop it off and put a note on it that she wants [place] to call her when it is ready and she will pick it up.

## 2019-06-27 ENCOUNTER — Other Ambulatory Visit: Payer: Self-pay

## 2019-06-27 ENCOUNTER — Encounter: Payer: Self-pay | Admitting: Podiatry

## 2019-06-27 ENCOUNTER — Ambulatory Visit (INDEPENDENT_AMBULATORY_CARE_PROVIDER_SITE_OTHER): Payer: Medicare Other | Admitting: Podiatry

## 2019-06-27 VITALS — Temp 98.6°F

## 2019-06-27 DIAGNOSIS — M722 Plantar fascial fibromatosis: Secondary | ICD-10-CM

## 2019-06-27 NOTE — Progress Notes (Signed)
She presents today for follow-up of her bilateral heels.  She denies fever chills nausea vomiting muscle aches pain states that her left heel is the only one is hurting now.  Objective: She has pain on palpation medial calcaneal tubercle of her left heel.  None on the right.  Assessment: Fasciitis left.  Plan: I reinjected the left heel today 20 mg Kenalog 5 mg Marcaine point of maximal tenderness left.  I will follow-up with her in about 6 weeks.

## 2019-07-13 ENCOUNTER — Ambulatory Visit: Payer: Medicare Other | Attending: Internal Medicine

## 2019-07-13 DIAGNOSIS — Z23 Encounter for immunization: Secondary | ICD-10-CM

## 2019-07-13 NOTE — Progress Notes (Signed)
   Covid-19 Vaccination Clinic  Name:  Krystal Kelley    MRN: [id number] DOB: 1970-08-04  07/13/2019  Krystal Kelley was observed post Covid-19 immunization for 15 minutes without incident. She was provided with Vaccine Information Sheet and instruction to access the V-Safe system.   Krystal Kelley was instructed to call 911 with any severe reactions post vaccine: [name] Difficulty breathing  . Swelling of face and throat  . A fast heartbeat  . A bad rash all over body  . Dizziness and weakness   Immunizations Administered    Name Date Dose VIS Date Route   Pfizer COVID-19 Vaccine 07/13/2019 12:51 PM 0.3 mL 03/30/2019 Intramuscular   Manufacturer: [employer]   Lot: [id number]   [place]: [id number]

## 2019-07-20 ENCOUNTER — Ambulatory Visit: Payer: Medicare Other | Attending: Internal Medicine | Admitting: Physical Therapy

## 2019-07-20 DIAGNOSIS — M25611 Stiffness of right shoulder, not elsewhere classified: Secondary | ICD-10-CM | POA: Insufficient documentation

## 2019-07-20 DIAGNOSIS — M25511 Pain in right shoulder: Secondary | ICD-10-CM | POA: Insufficient documentation

## 2019-07-20 DIAGNOSIS — G8929 Other chronic pain: Secondary | ICD-10-CM | POA: Insufficient documentation

## 2019-07-24 ENCOUNTER — Ambulatory Visit: Payer: Medicare Other | Admitting: Physical Therapy

## 2019-07-27 ENCOUNTER — Ambulatory Visit: Payer: Medicare Other | Admitting: Physical Therapy

## 2019-07-27 ENCOUNTER — Other Ambulatory Visit: Payer: Self-pay

## 2019-07-27 ENCOUNTER — Encounter: Payer: Self-pay | Admitting: Physical Therapy

## 2019-07-27 DIAGNOSIS — M25611 Stiffness of right shoulder, not elsewhere classified: Secondary | ICD-10-CM

## 2019-07-27 DIAGNOSIS — M25511 Pain in right shoulder: Secondary | ICD-10-CM | POA: Diagnosis present

## 2019-07-27 DIAGNOSIS — G8929 Other chronic pain: Secondary | ICD-10-CM | POA: Diagnosis present

## 2019-07-27 NOTE — Therapy (Signed)
[facility] [address], [place], [address] Phone: [phone]   Fax:  [phone]  Physical Therapy Evaluation  Patient Details  Name: Krystal Kelley MRN: [id number] Date of Birth: December 30, 1970 No data recorded  Encounter Date: 07/27/2019  PT End of Session - 07/27/19 1019    Visit Number  1    Number of Visits  13    Date for PT Re-Evaluation  09/07/19    PT Start Time  0953    PT Stop Time  1045    PT Time Calculation (min)  52 min    Activity Tolerance  Patient tolerated treatment well    Behavior During Therapy  [facility] for tasks assessed/performed       Past Medical History:  Diagnosis Date  . Anxiety   . Breast cancer, right breast ([place])   . Depression   . H/O laparoscopy   . History of hysteroscopy   . Peripheral edema    on lasix  . PONV (postoperative nausea and vomiting)   . Radiation 10/30/14-12/17/14   right breast 50.4 gray    Past Surgical History:  Procedure Laterality Date  . BREAST BIOPSY  05/16/14  . BREAST LUMPECTOMY Right 06/18/2014  . bunion surgrery    . CARPAL TUNNEL RELEASE Left 09/21/2016   Procedure: LEFT CARPAL TUNNEL RELEASE;  Surgeon: Daryll Brod, MD;  Location: [place];  Service: Orthopedics;  Laterality: Left;  REG/FAB  . carpel tunnel    . CHOLECYSTECTOMY  2015  . HYSTERECTOMY ABDOMINAL WITH SALPINGO-OOPHORECTOMY  05/22/2018   Procedure: HYSTERECTOMY ABDOMINAL WITH BILATERAL SALPINGO-OOPHORECTOMY;  Surgeon: [name], MD;  Location: [place];  Service: Gynecology;;  . PLANTAR FASCIECTOMY    . PORTACATH PLACEMENT Left 06/18/2014   Procedure: INSERTION PORT-A-CATH;  Surgeon: Excell Seltzer, MD;  Location: [place];  Service: General;  Laterality: Left;  . ROBOTIC ASSISTED LAPAROSCOPIC LYSIS OF ADHESION  05/22/2018   Procedure: XI ROBOTIC ASSISTED LAPAROSCOPIC LYSIS OF ADHESION;  Surgeon: [name], MD;   Location: [place];  Service: Gynecology;;  . ROBOTIC ASSISTED TOTAL HYSTERECTOMY WITH BILATERAL SALPINGO OOPHERECTOMY Bilateral 05/22/2018   Procedure: ATTEMPTED XI ROBOTIC ASSISTED TOTAL HYSTERECTOMY WITH BILATERAL SALPINGO OOPHORECTOMY;  Surgeon: [name], MD;  Location: [place];  Service: Gynecology;  Laterality: Bilateral;    There were no vitals filed for this visit.   Subjective Assessment - 07/27/19 0956    Pertinent History  Krystal Kelley is a 49 y.o. female with R shoulder pain/stiffness. History of right breast cancer treated with lumpectomy (April 2016), adjuvant chemotherapy with Herceptin, and radiation who is currently on anastrozole. She reports her bilat shoulder motion has been decreasing over the past few years, but the R shoulder motion is worse. Reports bilat shoulder pain that feels like it is in the joint, and is a dull ache. Best pain over the past week 0/10, worst 6/10. Pain brought on by reaching behind her back for toileting and bathing. Patient is on disability but enjoys yard work, but this is somewhat difficult d/t shoulder pain but she "pushes through it". Pt denies N/V, B&B changes, unexplained weight fluctuation, saddle paresthesia, fever, night sweats, or unrelenting night pain at this time.    Limitations  Lifting    How long can you sit comfortably?  unlimited    How long can you stand comfortably?  unlimited    How long can you walk  comfortably?  unlimited    Diagnostic tests  not since 2016    Patient Stated Goals  Increase shoulder ROM and decrease pain    Currently in Pain?  Yes    Pain Score  0-No pain    Pain Location  Shoulder    Pain Orientation  Right;Left    Pain Descriptors / Indicators  Dull;Aching    Pain Type  Chronic pain    Pain Onset  More than a month ago    Pain Frequency  Intermittent    Aggravating Factors   reaching behind head, or back    Pain Relieving Factors  Ibprofen, alieve     Effect of Pain on Daily Activities  unable to complete washing, bathing, toileting without pain       Dominant Hand: Right   OBJECTIVE  MUSCULOSKELETAL: Tremor: Normal Bulk: Normal Tone: Normal  Cervical Screen AROM: WFL and painless with overpressure in all planes Spurlings A (ipsilateral lateral flexion/axial compression): Negative bilat Spurlings B (ipsilateral lateral flexion/contralateral rotation/axial compression): Negative bilat Repeated movement: No centralization or peripheralization with protraction or retraction Hoffman Sign (cervical cord compression): Negative bilat ULTT Median: Negative bilat ULTT Ulnar: Negative bilat ULTT Radial: Negative bilat  Elbow Screen Elbow AROM: WNL  Palpation TTP  With facial grimace at R pec minor/major and UT, reports some soreness at infraspinatus and supraspinatus but reports this is "normal with medication all over her body"   Posture: Upper crossed with shoulder rounding in supine and sitting  Strength R/L 5/5 Shoulder flexion (anterior deltoid/pec major/coracobrachialis, axillary n. (C5-6) and musculocutaneous n. (C5-7)) 5/5 Shoulder abduction (deltoid/supraspinatus, axillary/suprascapular n, C5) 5/5 Shoulder external rotation (infraspinatus/teres minor) 5/5 Shoulder internal rotation (subcapularis/lats/pec major) 5/5 Shoulder extension (posterior deltoid, lats, teres major, axillary/thoracodorsal n.) 5/5 Shoulder horizontal abduction 5/5 Elbow flexion (biceps brachii, brachialis, brachioradialis, musculoskeletal n, C5-6) 5/5 Elbow extension (triceps, radial n, C7) 5/5 Wrist Extension 5/5 Wrist Flexion 5/5 Finger adduction (interossei, ulnar n, T1) 4/4 Latissimus 3+/3+Y lower trap 4-/4- T Scapular retractors 4+/4+ I position  AROM R/L 180/180 Shoulder flexion 180/180 Shoulder abduction C7/C8 Shoulder external rotation T8/T5 Shoulder internal rotation 70/70 Shoulder extension *Indicates pain, overpressure  performed unless otherwise indicated  PROM = AROM  Accessory Motions/Glides Glenohumeral: All WNL with some gaurding with R posterior mobilization   Acromioclavicular:  Post and ant WNL bilat  Sternoclavicular: WNL bilat  Muscle Length Testing Pectoralis Major: R:Abnormal R Pectoralis Minor: Abnormal bilat Biceps: Normal bilat  NEUROLOGICAL:  Mental Status Patient is oriented to person, place and time.  Recent memory is intact.  Remote memory is intact.  Attention span and concentration are intact.  Expressive speech is intact.  Patient's fund of knowledge is within normal limits for educational level.  Sensation Grossly intact to light touch bilateral UE as determined by testing dermatomes C2-T2 Proprioception and hot/cold testing deferred on this date   SPECIAL TESTS  Rotator Cuff  Drop Arm Test: Negative bilat Painful Arc (Pain from 60 to 120 degrees scaption):  Negative bilat Infraspinatus Muscle Test:  Negative bilat If all 3 tests positive, the probability of a full-thickness rotator cuff tear is 91%  Subacromial Impingement Hawkins-Kennedy: Positive bilat Neer (Block scapula, PROM flexion): Positive bilat Painful Arc (Pain from 60 to 120 degrees scaption):  Negative bilat Empty [id number] bilat External Rotation Resistance: Mildly positive bilat Horizontal Adduction:  Negative bilat  Labral Tear Biceps Load II (120 elevation, full ER, 90 elbow flexion, full supination, resisted elbow flexion):  Negative bilat Crank (160 scaption,  axial load with IR/ER):  Negative bilat Active Compression Test Negative bilat  Bicep Tendon Pathology Speed (shoulder flexion to 90, external rotation, full elbow extension, and forearm supination with resistance: Negative bilat Yergason's (resisted shoulder ER and supination/biceps tendon pathology): Negative bilat  Shoulder Instability Sulcus Sign: Negative bilat Anterior Apprehension:Negative  bilat  Ther-Ex Patient is able to complete a set of the following therex with proper technique following min cuing for proper motor control and muscle activation. Patient educated on length/tension relationship of the shoulder girdle and correction needed for optimal function with good understanding with visual aid. Educated patient on frequency, rep/set range, intensity and purpose of therex with verbalized understanding.  Access Code: [email or web address]:  Doorway Pec Stretch at 90 Degrees Abduction - 3 x daily - 7 x weekly - 67min hold  Seated Scapular Retraction - 8 x daily - 7 x weekly - 10 reps - 3sec hold  Prone Scapular Retraction Y - 1 x daily - 7 x weekly - 10 reps - 3 sets              Objective measurements completed on examination: See above findings.              PT Education - 07/27/19 1003    Education Details  Patient was educated on diagnosis, anatomy and pathology involved, prognosis, role of PT, and was given an HEP, demonstrating exercise with proper form following verbal and tactile cues, and was given a paper hand out to continue exercise at home. Pt was educated on and agreed to plan of care.    Person(s) Educated  Patient    Methods  Explanation;Demonstration;Tactile cues;Verbal cues    Comprehension  Verbalized understanding;Returned demonstration;Verbal cues required;Tactile cues required       PT Short Term Goals - 07/27/19 1029      PT SHORT TERM GOAL #1   Title  Pt will be independent with HEP in order to improve strength and decrease pain in order to improve pain-free function at home and work.    Baseline  07/27/19 HEP given    Time  4    Period  Weeks    Status  New        PT Long Term Goals - 07/27/19 1030      PT LONG TERM GOAL #1   Title  Patient will increase FOTO score to 68 to demonstrate predicted increase in functional mobility to complete ADLs    Baseline  07/27/19 67    Time  6    Period  Weeks    Status  New      PT LONG  TERM GOAL #2   Title  Patient will demonstrate at least 4+/5 bilat periscapular strength in order to complete heavy tasks in the yard    Baseline  07/27/19 R/L Latissimus 4/4 Y 3+/3+ T 4-/4-    Time  6    Period  Weeks    Status  New      PT LONG TERM GOAL #3   Title  Patient will report no pain with bathing and toileting in order to increase independence and QOL    Baseline  07/27/19 6/10 with toileting and washing her back in the shower    Time  [name] - 07/27/19 1033    Clinical Impression Statement  PT is a 11 year  olf female presenting with signs and symptoms of bilat shoudler impingement R>L. Pertinent PMH R lumpectomy with radiation and chmotherapy treatment 2016. Impairments in pain with behind the back motion, decreased IR/ER active motion, decreased periscapular strength, and postural dysfunction. Activity limitations in lifting, reaching behind back, and overhead; inhibiting full participation in [place] (bathing, toiletins) and completing yard work. Would benefit from skilled PT to address above deficits and promote optimal return to PLOF.    Personal Factors and Comorbidities  Fitness;Time since onset of injury/illness/exacerbation;Comorbidity 1;Past/Current Experience;Sex    Comorbidities  PMH R breast cancer    Examination-Activity Limitations  Sleep;Toileting;Reach Overhead;Hygiene/Grooming;Bathing    Examination-Participation Restrictions  Laundry;Cleaning;Yard Work;Meal Prep;Community Activity    Stability/Clinical Decision Making  Evolving/Moderate complexity    Clinical Decision Making  Moderate    Rehab Potential  Good    PT Frequency  2x / week    PT Duration  8 weeks    PT Treatment/Interventions  ADLs/Self Care Home Management;Electrical Stimulation;Therapeutic activities;Patient/family education;Moist Heat;Traction;Cryotherapy;Ultrasound;Functional mobility training;Manual techniques;Dry needling;Neuromuscular  re-education;Passive range of motion;Joint Manipulations;Therapeutic exercise;DME Instruction    PT Home Exercise Plan  [email or web address]:    Consulted and Agree with Plan of Care  Patient       Patient will benefit from skilled therapeutic intervention in order to improve the following deficits and impairments:  Pain, Improper body mechanics, Increased fascial restricitons, Decreased mobility, Postural dysfunction, Decreased activity tolerance, Decreased endurance, Decreased range of motion, Decreased strength, Impaired UE functional use, Impaired flexibility  Visit Diagnosis: Chronic right shoulder pain  Stiffness of right shoulder, not elsewhere classified     Problem List Patient Active Problem List   Diagnosis Date Noted  . Post-operative state 05/22/2018  . Postoperative state 05/22/2018  . Posttraumatic stress disorder 12/09/2016  . Leg edema, right 10/03/2014  . Genetic testing 07/10/2014  . Breast cancer of lower-inner quadrant of right female breast ([place]) 05/29/2014   Shelton Silvas PT, DPT Shelton Silvas 07/27/2019, 10:45 AM  [place] [facility] [address], [place], [address] Phone: [phone]   Fax:  [phone]  Name: Krystal Kelley MRN: [id number] Date of Birth: 05-07-1970

## 2019-07-30 ENCOUNTER — Ambulatory Visit: Payer: Self-pay | Admitting: Dermatology

## 2019-07-31 ENCOUNTER — Ambulatory Visit: Payer: Medicare Other | Admitting: Physical Therapy

## 2019-07-31 ENCOUNTER — Other Ambulatory Visit: Payer: Self-pay

## 2019-07-31 ENCOUNTER — Encounter: Payer: Medicare Other | Admitting: Physical Therapy

## 2019-07-31 ENCOUNTER — Encounter: Payer: Self-pay | Admitting: Physical Therapy

## 2019-07-31 DIAGNOSIS — M25511 Pain in right shoulder: Secondary | ICD-10-CM | POA: Diagnosis not present

## 2019-07-31 DIAGNOSIS — G8929 Other chronic pain: Secondary | ICD-10-CM

## 2019-07-31 DIAGNOSIS — M25611 Stiffness of right shoulder, not elsewhere classified: Secondary | ICD-10-CM

## 2019-07-31 NOTE — Therapy (Signed)
[place] [facility] [address] [place], [address] Phone: [phone]   Fax:  [phone]  Physical Therapy Treatment  Patient Details  Name: Krystal Kelley MRN: [id number] Date of Birth: 07/23/1970 No data recorded  Encounter Date: 07/31/2019  PT End of Session - 07/31/19 1044    Visit Number  2    Number of Visits  13    Date for PT Re-Evaluation  09/07/19    PT Start Time  1037    PT Stop Time  1115    PT Time Calculation (min)  38 min    Activity Tolerance  Patient tolerated treatment well    Behavior During Therapy  [facility] for tasks assessed/performed       Past Medical History:  Diagnosis Date  . Anxiety   . Breast cancer, right breast ([place])   . Depression   . H/O laparoscopy   . History of hysteroscopy   . Peripheral edema    on lasix  . PONV (postoperative nausea and vomiting)   . Radiation 10/30/14-12/17/14   right breast 50.4 gray    Past Surgical History:  Procedure Laterality Date  . BREAST BIOPSY  05/16/14  . BREAST LUMPECTOMY Right 06/18/2014  . bunion surgrery    . CARPAL TUNNEL RELEASE Left 09/21/2016   Procedure: LEFT CARPAL TUNNEL RELEASE;  Surgeon: Daryll Brod, MD;  Location: [place];  Service: Orthopedics;  Laterality: Left;  REG/FAB  . carpel tunnel    . CHOLECYSTECTOMY  2015  . HYSTERECTOMY ABDOMINAL WITH SALPINGO-OOPHORECTOMY  05/22/2018   Procedure: HYSTERECTOMY ABDOMINAL WITH BILATERAL SALPINGO-OOPHORECTOMY;  Surgeon: [name], MD;  Location: [place];  Service: Gynecology;;  . PLANTAR FASCIECTOMY    . PORTACATH PLACEMENT Left 06/18/2014   Procedure: INSERTION PORT-A-CATH;  Surgeon: Excell Seltzer, MD;  Location: [place];  Service: General;  Laterality: Left;  . ROBOTIC ASSISTED LAPAROSCOPIC LYSIS OF ADHESION  05/22/2018   Procedure: XI ROBOTIC ASSISTED LAPAROSCOPIC LYSIS OF ADHESION;  Surgeon: [name], MD;   Location: [place];  Service: Gynecology;;  . ROBOTIC ASSISTED TOTAL HYSTERECTOMY WITH BILATERAL SALPINGO OOPHERECTOMY Bilateral 05/22/2018   Procedure: ATTEMPTED XI ROBOTIC ASSISTED TOTAL HYSTERECTOMY WITH BILATERAL SALPINGO OOPHORECTOMY;  Surgeon: [name], MD;  Location: [place];  Service: Gynecology;  Laterality: Bilateral;    There were no vitals filed for this visit.  Subjective Assessment - 07/31/19 1040    Subjective  [place] arrives late due to plumbing issues at home. Reports compliance with HEP, that her exercises feel good, and that she has been working on her posture.    Pertinent History  Krystal Kelley is a 49 y.o. female with R shoulder pain/stiffness. History of right breast cancer treated with lumpectomy (April 2016), adjuvant chemotherapy with Herceptin, and radiation who is currently on anastrozole. She reports her bilat shoulder motion has been decreasing over the past few years, but the R shoulder motion is worse. Reports bilat shoulder pain that feels like it is in the joint, and is a dull ache. Best pain over the past week 0/10, worst 6/10. Pain brought on by reaching behind her back for toileting and bathing. Patient is on disability but enjoys yard work, but this is somewhat difficult d/t shoulder pain but she "pushes through it". Pt denies N/V, B&B changes, unexplained weight fluctuation, saddle paresthesia, fever, night sweats, or unrelenting night pain at this time.    Limitations  Lifting    How long can you sit comfortably?  unlimited    How long can you stand comfortably?  unlimited    How long can you walk comfortably?  unlimited    Patient Stated Goals  Increase shoulder ROM and decrease pain    Pain Onset  More than a month ago       Ther-Ex UBE 11mins forward/46mins backward for increased endurance of UE use and warm up Seated rows 20# 3x 10 with cuing to prevent shoulder hiking and for scapular retraction with good  carry over Lat pulldown 20# x10; 25# x10; 35# x10  with cuing initially for technique, increased difficulty with R scapulo-humeral rhthym Scaption with 2# DB 3x 10 with cuing initially for technique with scapulo-humeral rhythm with decent carry over Prone Y 3x 10 with min cuing initially for proper technique without shoulder hiking with good carry over Seated scapular retractions x12 3sec hold with cuing for cervical neutral for set up with good carry over Doorway pec stretch 2x 45min hold with min cuing for posture with good carry over                      PT Education - 07/31/19 1044    Education Details  Therex form, HEP review    Person(s) Educated  Patient    Methods  Explanation;Demonstration;Verbal cues    Comprehension  Verbalized understanding;Returned demonstration;Verbal cues required       PT Short Term Goals - 07/27/19 1029      PT SHORT TERM GOAL #1   Title  Pt will be independent with HEP in order to improve strength and decrease pain in order to improve pain-free function at home and work.    Baseline  07/27/19 HEP given    Time  4    Period  Weeks    Status  New        PT Long Term Goals - 07/27/19 1030      PT LONG TERM GOAL #1   Title  Patient will increase FOTO score to 68 to demonstrate predicted increase in functional mobility to complete ADLs    Baseline  07/27/19 67    Time  6    Period  Weeks    Status  New      PT LONG TERM GOAL #2   Title  Patient will demonstrate at least 4+/5 bilat periscapular strength in order to complete heavy tasks in the yard    Baseline  07/27/19 R/L Latissimus 4/4 Y 3+/3+ T 4-/4-    Time  6    Period  Weeks    Status  New      PT LONG TERM GOAL #3   Title  Patient will report no pain with bathing and toileting in order to increase independence and QOL    Baseline  07/27/19 6/10 with toileting and washing her back in the shower    Time  [place] - 07/31/19  1051    Clinical Impression Statement  PT initiated therex for increased postural strength and motor control with good success. Patient is able to comoply with cuing for proper technique with good motivation and no increased pain throughout session. Patient with observable scapular dyskinesis R>L with increased resistance, which she is able to work toward. PT will continue progression as able.  Personal Factors and Comorbidities  Fitness;Time since onset of injury/illness/exacerbation;Comorbidity 1;Past/Current Experience;Sex    Comorbidities  PMH R breast cancer    Examination-Activity Limitations  Sleep;Toileting;Reach Overhead;Hygiene/Grooming;Bathing    Examination-Participation Restrictions  Laundry;Cleaning;Yard Work;Meal Prep;Community Activity    Stability/Clinical Decision Making  Evolving/Moderate complexity    Clinical Decision Making  Moderate    Rehab Potential  Good    PT Frequency  2x / week    PT Duration  8 weeks    PT Treatment/Interventions  ADLs/Self Care Home Management;Electrical Stimulation;Therapeutic activities;Patient/family education;Moist Heat;Traction;Cryotherapy;Ultrasound;Functional mobility training;Manual techniques;Dry needling;Neuromuscular re-education;Passive range of motion;Joint Manipulations;Therapeutic exercise;DME Instruction    PT Home Exercise Plan  [email or web address]:    Consulted and Agree with Plan of Care  Patient       Patient will benefit from skilled therapeutic intervention in order to improve the following deficits and impairments:  Pain, Improper body mechanics, Increased fascial restricitons, Decreased mobility, Postural dysfunction, Decreased activity tolerance, Decreased endurance, Decreased range of motion, Decreased strength, Impaired UE functional use, Impaired flexibility  Visit Diagnosis: Chronic right shoulder pain  Stiffness of right shoulder, not elsewhere classified     Problem List Patient Active Problem List   Diagnosis Date  Noted  . Post-operative state 05/22/2018  . Postoperative state 05/22/2018  . Posttraumatic stress disorder 12/09/2016  . Leg edema, right 10/03/2014  . Genetic testing 07/10/2014  . Breast cancer of lower-inner quadrant of right female breast ([place]) 05/29/2014   Shelton Silvas PT, DPT Shelton Silvas 07/31/2019, 11:11 AM  [place] [facility] [address] [place], [address] Phone: [phone]   Fax:  [phone]  Name: Krystal Kelley MRN: [id number] Date of Birth: 09-15-1970

## 2019-08-01 ENCOUNTER — Ambulatory Visit: Payer: Medicare Other | Admitting: Physical Therapy

## 2019-08-01 ENCOUNTER — Encounter: Payer: Self-pay | Admitting: Physical Therapy

## 2019-08-01 DIAGNOSIS — M25611 Stiffness of right shoulder, not elsewhere classified: Secondary | ICD-10-CM

## 2019-08-01 DIAGNOSIS — G8929 Other chronic pain: Secondary | ICD-10-CM

## 2019-08-01 DIAGNOSIS — M25511 Pain in right shoulder: Secondary | ICD-10-CM | POA: Diagnosis not present

## 2019-08-01 NOTE — Therapy (Signed)
[facility] [address] [place], [address] Phone: [phone]   Fax:  [phone]  Physical Therapy Treatment  Patient Details  Name: Krystal Kelley MRN: [id number] Date of Birth: 1971/01/19 No data recorded  Encounter Date: 08/01/2019  PT End of Session - 08/01/19 1129    Visit Number  3    Number of Visits  13    Date for PT Re-Evaluation  09/07/19    PT Start Time  [id number]    PT Stop Time  1201    PT Time Calculation (min)  38 min    Activity Tolerance  Patient tolerated treatment well    Behavior During Therapy  [facility] for tasks assessed/performed       Past Medical History:  Diagnosis Date  . Anxiety   . Breast cancer, right breast ([place])   . Depression   . H/O laparoscopy   . History of hysteroscopy   . Peripheral edema    on lasix  . PONV (postoperative nausea and vomiting)   . Radiation 10/30/14-12/17/14   right breast 50.4 gray    Past Surgical History:  Procedure Laterality Date  . BREAST BIOPSY  05/16/14  . BREAST LUMPECTOMY Right 06/18/2014  . bunion surgrery    . CARPAL TUNNEL RELEASE Left 09/21/2016   Procedure: LEFT CARPAL TUNNEL RELEASE;  Surgeon: Daryll Brod, MD;  Location: [facility];  Service: Orthopedics;  Laterality: Left;  REG/FAB  . carpel tunnel    . CHOLECYSTECTOMY  2015  . HYSTERECTOMY ABDOMINAL WITH SALPINGO-OOPHORECTOMY  05/22/2018   Procedure: HYSTERECTOMY ABDOMINAL WITH BILATERAL SALPINGO-OOPHORECTOMY;  Surgeon: [name], MD;  Location: [facility];  Service: Gynecology;;  . PLANTAR FASCIECTOMY    . PORTACATH PLACEMENT Left 06/18/2014   Procedure: INSERTION PORT-A-CATH;  Surgeon: Excell Seltzer, MD;  Location: [facility];  Service: General;  Laterality: Left;  . ROBOTIC ASSISTED LAPAROSCOPIC LYSIS OF ADHESION  05/22/2018   Procedure: XI ROBOTIC ASSISTED LAPAROSCOPIC LYSIS OF ADHESION;  Surgeon: [name], MD;   Location: [facility];  Service: Gynecology;;  . ROBOTIC ASSISTED TOTAL HYSTERECTOMY WITH BILATERAL SALPINGO OOPHERECTOMY Bilateral 05/22/2018   Procedure: ATTEMPTED XI ROBOTIC ASSISTED TOTAL HYSTERECTOMY WITH BILATERAL SALPINGO OOPHORECTOMY;  Surgeon: [name], MD;  Location: [facility];  Service: Gynecology;  Laterality: Bilateral;    There were no vitals filed for this visit.  Subjective Assessment - 08/01/19 1125    Subjective  Patient arrives late to session. No pain, just motion restrictions. Compliance with HEP. Felt sore following yesterday's session, but no pain    Pertinent History  Krystal Kelley is a 49 y.o. female with R shoulder pain/stiffness. History of right breast cancer treated with lumpectomy (April 2016), adjuvant chemotherapy with Herceptin, and radiation who is currently on anastrozole. She reports her bilat shoulder motion has been decreasing over the past few years, but the R shoulder motion is worse. Reports bilat shoulder pain that feels like it is in the joint, and is a dull ache. Best pain over the past week 0/10, worst 6/10. Pain brought on by reaching behind her back for toileting and bathing. Patient is on disability but enjoys yard work, but this is somewhat difficult d/t shoulder pain but she "pushes through it". Pt denies N/V, B&B changes, unexplained weight fluctuation, saddle paresthesia, fever, night sweats, or unrelenting night pain at this time.    Limitations  Lifting    How long  can you sit comfortably?  unlimited    How long can you stand comfortably?  unlimited    How long can you walk comfortably?  unlimited    Diagnostic tests  not since 2016    Patient Stated Goals  Increase shoulder ROM and decrease pain    Pain Onset  More than a month ago         Ther-Ex UBE 25mins forward/53mins backward for increased endurance of UE use and warm up Standing rows 15# 3x 10 with cuing for set up posture with good carry  over Lat pulldown 35# 3 x10/9  with min cuing initially to prevent shoulder hiking on return with good carry over Sc[place]ion with 2# DB 3x 10 with good carry over from last session Wall slide 2x 12 with cuing for technique with difficulty maintaining ER with elevation, able to improve this with cuing Military press 2# DB bilat 3 x10 with cuing and demo for technique with scapular retraction with good carry over Doorway pec stretch 2x 33min hold with min cuing for posture with good carry over                       PT Education - 08/01/19 1129    Education Details  therex form, posture    Person(s) Educated  Patient    Methods  Explanation;Demonstration;Verbal cues    Comprehension  Verbalized understanding;Returned demonstration;Verbal cues required       PT Short Term Goals - 07/27/19 1029      PT SHORT TERM GOAL #1   Title  Pt will be independent with HEP in order to improve strength and decrease pain in order to improve pain-free function at home and work.    Baseline  07/27/19 HEP given    Time  4    Period  Weeks    Status  New        PT Long Term Goals - 07/27/19 1030      PT LONG TERM GOAL #1   Title  Patient will increase FOTO score to 68 to demonstrate predicted increase in functional mobility to complete ADLs    Baseline  07/27/19 67    Time  6    Period  Weeks    Status  New      PT LONG TERM GOAL #2   Title  Patient will demonstrate at least 4+/5 bilat periscapular strength in order to complete heavy tasks in the yard    Baseline  07/27/19 R/L Latissimus 4/4 Y 3+/3+ T 4-/4-    Time  6    Period  Weeks    Status  New      PT LONG TERM GOAL #3   Title  Patient will report no pain with bathing and toileting in order to increase independence and QOL    Baseline  07/27/19 6/10 with toileting and washing her back in the shower    Time  [place] - 08/01/19 1130    Clinical Impression Statement  Session  shortened d/t patient arriving late. PT continued therex progression for scapulo-humeral rhythm and increased periscapular strength with good success. Patietn is able to comoply with cuing for proper therex technique, and is motivated throughout session with no increased pain throughout session.    Personal Factors and Comorbidities  Fitness;Time since onset of injury/illness/exacerbation;Comorbidity 1;Past/Current  Experience;Sex    Comorbidities  PMH R breast cancer    Examination-Activity Limitations  Sleep;Toileting;Reach Overhead;Hygiene/Grooming;Bathing    Examination-Participation Restrictions  Laundry;Cleaning;Yard Work;Meal Prep;Community Activity    Stability/Clinical Decision Making  Evolving/Moderate complexity    Clinical Decision Making  Moderate    Rehab Potential  Good    PT Frequency  2x / week    PT Duration  8 weeks    PT Treatment/Interventions  ADLs/Self Care Home Management;Electrical Stimulation;Therapeutic activities;Patient/family education;Moist Heat;Traction;Cryotherapy;Ultrasound;Functional mobility training;Manual techniques;Dry needling;Neuromuscular re-education;Passive range of motion;Joint Manipulations;Therapeutic exercise;DME Instruction    PT Home Exercise Plan  [email or web address]:    Consulted and Agree with Plan of Care  Patient       Patient will benefit from skilled therapeutic intervention in order to improve the following deficits and impairments:  Pain, Improper body mechanics, Increased fascial restricitons, Decreased mobility, Postural dysfunction, Decreased activity tolerance, Decreased endurance, Decreased range of motion, Decreased strength, Impaired UE functional use, Impaired flexibility  Visit Diagnosis: Chronic right shoulder pain  Stiffness of right shoulder, not elsewhere classified     Problem List Patient Active Problem List   Diagnosis Date Noted  . Post-operative state 05/22/2018  . Postoperative state 05/22/2018  . Posttraumatic stress  disorder 12/09/2016  . Leg edema, right 10/03/2014  . Genetic testing 07/10/2014  . Breast cancer of lower-inner quadrant of right female breast ([place]) 05/29/2014   Shelton Silvas PT, DPT Shelton Silvas 08/01/2019, 11:59 AM  [facility] [address] [place], [address] Phone: [phone]   Fax:  [phone]  Name: Krystal Kelley MRN: [id number] Date of Birth: 12-Nov-1970

## 2019-08-03 ENCOUNTER — Encounter: Payer: Medicare Other | Admitting: Physical Therapy

## 2019-08-03 ENCOUNTER — Ambulatory Visit: Payer: Medicare Other | Attending: Internal Medicine

## 2019-08-03 ENCOUNTER — Ambulatory Visit: Payer: Medicare Other | Admitting: Physical Therapy

## 2019-08-03 DIAGNOSIS — Z23 Encounter for immunization: Secondary | ICD-10-CM

## 2019-08-03 NOTE — Progress Notes (Signed)
   Covid-19 Vaccination Clinic  Name:  Krystal Kelley    MRN: [id number] DOB: December 18, 1970  08/03/2019  [name] was observed post Covid-19 immunization for 15 minutes without incident. She was provided with Vaccine Information Sheet and instruction to access the V-Safe system.   [name] was instructed to call 911 with any severe reactions post vaccine: [name] Difficulty breathing  . Swelling of face and throat  . A fast heartbeat  . A bad rash all over body  . Dizziness and weakness   Immunizations Administered    Name Date Dose VIS Date Route   Pfizer COVID-19 Vaccine 08/03/2019 12:40 PM 0.3 mL 03/30/2019 Intramuscular   Manufacturer: [place]   Lot: [id number]   [place]: [id number]

## 2019-08-06 ENCOUNTER — Other Ambulatory Visit: Payer: Self-pay

## 2019-08-06 ENCOUNTER — Ambulatory Visit (INDEPENDENT_AMBULATORY_CARE_PROVIDER_SITE_OTHER): Payer: Medicare Other | Admitting: Dermatology

## 2019-08-06 DIAGNOSIS — B360 Pityriasis versicolor: Secondary | ICD-10-CM | POA: Diagnosis not present

## 2019-08-06 DIAGNOSIS — L509 Urticaria, unspecified: Secondary | ICD-10-CM | POA: Diagnosis not present

## 2019-08-06 NOTE — Progress Notes (Signed)
   Follow-Up Visit   Subjective  Krystal Kelley is a 49 y.o. female who presents for the following: Follow-up (Tinea Versicolor follow up of face. Has improved. Uses Ketoconazole cream qod alternating with Mometasone cream prn flares. ) and Other (History of urticaria - resolved. Takes Allegra 180mg  qd.).    The following portions of the chart were reviewed this encounter and updated as appropriate: Tobacco  Allergies  Meds  Problems  Med Hx  Surg Hx  Fam Hx      Review of Systems: No other skin or systemic complaints.  Objective  Well appearing patient in no apparent distress; mood and affect are within normal limits.  A focused examination was performed including face, neck. Relevant physical exam findings are noted in the Assessment and Plan.  Objective  Left Parotid Area: 1.5 x 1.0 cm oval patch of hypopigmentation.  Images    Objective  Neck - Anterior: Urticarial patch.  Images    Assessment & Plan  Tinea versicolor face  With urticaria - Improved.  Continue Ketoconazole 2% cream qod, Mometasone cream qod prn flares.  Urticaria Neck - Anterior  Mometasone cream qd-bid until clear.  Continue Allegra 180 mg 1 po qd.  Hypopigmented macule - L cheek - present for years - benign appearing, observe.  Photo taken.  Return if symptoms worsen or fail to improve.   I, Ashok Cordia, CMA, am acting as scribe for Sarina Ser, MD .  Documentation: I have reviewed the above documentation for accuracy and completeness, and I agree with the above.  Sarina Ser, MD

## 2019-08-07 ENCOUNTER — Ambulatory Visit: Payer: Medicare Other | Admitting: Physical Therapy

## 2019-08-07 ENCOUNTER — Encounter: Payer: Medicare Other | Admitting: Physical Therapy

## 2019-08-07 ENCOUNTER — Encounter: Payer: Self-pay | Admitting: Dermatology

## 2019-08-08 ENCOUNTER — Other Ambulatory Visit: Payer: Self-pay | Admitting: Podiatry

## 2019-08-08 ENCOUNTER — Encounter: Payer: Self-pay | Admitting: Podiatry

## 2019-08-08 ENCOUNTER — Other Ambulatory Visit: Payer: Self-pay

## 2019-08-08 ENCOUNTER — Ambulatory Visit (INDEPENDENT_AMBULATORY_CARE_PROVIDER_SITE_OTHER): Payer: Medicare Other

## 2019-08-08 ENCOUNTER — Ambulatory Visit (INDEPENDENT_AMBULATORY_CARE_PROVIDER_SITE_OTHER): Payer: Medicare Other | Admitting: Podiatry

## 2019-08-08 VITALS — Temp 97.7°F

## 2019-08-08 DIAGNOSIS — M778 Other enthesopathies, not elsewhere classified: Secondary | ICD-10-CM

## 2019-08-08 DIAGNOSIS — M722 Plantar fascial fibromatosis: Secondary | ICD-10-CM

## 2019-08-08 DIAGNOSIS — M779 Enthesopathy, unspecified: Secondary | ICD-10-CM

## 2019-08-08 NOTE — Progress Notes (Signed)
She presents today states that her left heel is bothering her but her right foot is starting to bother her as well states is been bothering her for the past couple of weeks after she spent from 7:00 in the morning to 3:00 the following morning in her yard working.  She states that she was not wearing the most stable shoes.  States that she has had this before and has gone away but now is back with a vengeance.  She states that it was happening even before she spent the time in the yard.  Objective: Vital signs are stable she is alert and oriented x3 she has pain on palpation mid calcaneal tubercle of the left foot.  Pulses remain strong and palpable bilateral.  She has pain on palpation fourth and fifth TMT joint of the right foot at the cuboid articulation.  She has pain on eversion against resistance in this area.  Radiographs taken today do not demonstrate any type of osseous abnormalities in this area.  Assessment: Capsulitis right TMT joint.  She also has plantar fasciitis left.  Plan: I went ahead and injected the left heel today 20 mg Kenalog 5 mg Marcaine injected the right foot similarly overlying the fourth fifth TMT joint.  Follow-up with her in 1 month.

## 2019-08-10 ENCOUNTER — Ambulatory Visit: Payer: Medicare Other | Admitting: Physical Therapy

## 2019-08-10 ENCOUNTER — Encounter: Payer: Medicare Other | Admitting: Physical Therapy

## 2019-08-10 ENCOUNTER — Other Ambulatory Visit: Payer: Self-pay

## 2019-08-10 ENCOUNTER — Encounter: Payer: Self-pay | Admitting: Physical Therapy

## 2019-08-10 DIAGNOSIS — M25511 Pain in right shoulder: Secondary | ICD-10-CM

## 2019-08-10 DIAGNOSIS — M25611 Stiffness of right shoulder, not elsewhere classified: Secondary | ICD-10-CM

## 2019-08-10 DIAGNOSIS — G8929 Other chronic pain: Secondary | ICD-10-CM

## 2019-08-10 NOTE — Therapy (Signed)
[name] [address] [place], [address] Phone: [phone]   Fax:  [phone]  Physical Therapy Treatment  Patient Details  Name: Krystal Kelley MRN: [id number] Date of Birth: July 10, 1970 No data recorded  Encounter Date: 08/10/2019  PT End of Session - 08/10/19 1017    Visit Number  4    Number of Visits  13    Date for PT Re-Evaluation  09/07/19    PT Start Time  1005    PT Stop Time  1043    PT Time Calculation (min)  38 min    Activity Tolerance  Patient tolerated treatment well    Behavior During Therapy  [facility] for tasks assessed/performed       Past Medical History:  Diagnosis Date  . Anxiety   . Breast cancer, right breast ([place])   . Depression   . H/O laparoscopy   . History of hysteroscopy   . Peripheral edema    on lasix  . PONV (postoperative nausea and vomiting)   . Radiation 10/30/14-12/17/14   right breast 50.4 gray    Past Surgical History:  Procedure Laterality Date  . BREAST BIOPSY  05/16/14  . BREAST LUMPECTOMY Right 06/18/2014  . bunion surgrery    . CARPAL TUNNEL RELEASE Left 09/21/2016   Procedure: LEFT CARPAL TUNNEL RELEASE;  Surgeon: Daryll Brod, MD;  Location: [facility];  Service: Orthopedics;  Laterality: Left;  REG/FAB  . carpel tunnel    . CHOLECYSTECTOMY  2015  . HYSTERECTOMY ABDOMINAL WITH SALPINGO-OOPHORECTOMY  05/22/2018   Procedure: HYSTERECTOMY ABDOMINAL WITH BILATERAL SALPINGO-OOPHORECTOMY;  Surgeon: [name], MD;  Location: [facility];  Service: Gynecology;;  . PLANTAR FASCIECTOMY    . PORTACATH PLACEMENT Left 06/18/2014   Procedure: INSERTION PORT-A-CATH;  Surgeon: Excell Seltzer, MD;  Location: [facility];  Service: General;  Laterality: Left;  . ROBOTIC ASSISTED LAPAROSCOPIC LYSIS OF ADHESION  05/22/2018   Procedure: XI ROBOTIC ASSISTED LAPAROSCOPIC LYSIS OF ADHESION;  Surgeon: [name], MD;   Location: [facility];  Service: Gynecology;;  . ROBOTIC ASSISTED TOTAL HYSTERECTOMY WITH BILATERAL SALPINGO OOPHERECTOMY Bilateral 05/22/2018   Procedure: ATTEMPTED XI ROBOTIC ASSISTED TOTAL HYSTERECTOMY WITH BILATERAL SALPINGO OOPHORECTOMY;  Surgeon: [name], MD;  Location: [facility];  Service: Gynecology;  Laterality: Bilateral;    There were no vitals filed for this visit.  Subjective Assessment - 08/10/19 1008    Subjective  Pt arrives late to session. Reports she has been having issues with her feet hurting, but she got a shot a couple days ago and that helped. No shoulder pain to date, compliance with HEP.    Pertinent History  Krystal Kelley is a 49 y.o. female with R shoulder pain/stiffness. History of right breast cancer treated with lumpectomy (April 2016), adjuvant chemotherapy with Herceptin, and radiation who is currently on anastrozole. She reports her bilat shoulder motion has been decreasing over the past few years, but the R shoulder motion is worse. Reports bilat shoulder pain that feels like it is in the joint, and is a dull ache. Best pain over the past week 0/10, worst 6/10. Pain brought on by reaching behind her back for toileting and bathing. Patient is on disability but enjoys yard work, but this is somewhat difficult d/t shoulder pain but she "pushes through it". Pt denies N/V, B&B changes, unexplained weight fluctuation, saddle paresthesia, fever, night sweats, or unrelenting night pain  at this time.    Limitations  Lifting    How long can you sit comfortably?  unlimited    How long can you stand comfortably?  unlimited    How long can you walk comfortably?  unlimited    Diagnostic tests  not since 2016    Patient Stated Goals  Increase shoulder ROM and decrease pain    Pain Onset  More than a month ago       Ther-Ex UBE 51mins forward/66mins backward for increased endurance of UE use and warm up Bilat Y on wall x10 with demo  and cuing with good carry over; with 2# 2x 10 some excessive mobility this direction that improves with cuing for scapular motion  Standing high rows 10# 3x 10 with cuing for set up posture with good carry over Lat pulldown 35# 3 x10 with min cuing initially to prevent shoulder hiking on return with good carry over Push up plus on wall 3x 10 with max cuing needed initially for proper technique with good carry over following Military press 3# DB bilat 3 x10 with min cuing to maintain scapular retraction with good carry over Doorway pec stretch 17min hold with min cuing for posture with good carry over                         PT Education - 08/10/19 1015    Education Details  therex form    Person(s) Educated  Patient    Methods  Explanation;Demonstration;Verbal cues    Comprehension  Verbalized understanding;Returned demonstration;Verbal cues required       PT Short Term Goals - 07/27/19 1029      PT SHORT TERM GOAL #1   Title  Pt will be independent with HEP in order to improve strength and decrease pain in order to improve pain-free function at home and work.    Baseline  07/27/19 HEP given    Time  4    Period  Weeks    Status  New        PT Long Term Goals - 07/27/19 1030      PT LONG TERM GOAL #1   Title  Patient will increase FOTO score to 68 to demonstrate predicted increase in functional mobility to complete ADLs    Baseline  07/27/19 67    Time  6    Period  Weeks    Status  New      PT LONG TERM GOAL #2   Title  Patient will demonstrate at least 4+/5 bilat periscapular strength in order to complete heavy tasks in the yard    Baseline  07/27/19 R/L Latissimus 4/4 Y 3+/3+ T 4-/4-    Time  6    Period  Weeks    Status  New      PT LONG TERM GOAL #3   Title  Patient will report no pain with bathing and toileting in order to increase independence and QOL    Baseline  07/27/19 6/10 with toileting and washing her back in the shower    Time  [place] - 08/10/19 1019    Clinical Impression Statement  PT continued therex progression for increased periscapular and shouler strength and motion. Patient with some excessive mobility, requiring heavy cuing and demo for scapulo-humeral rthym for stability of shoulder  girdle. Patient is able to complete all therex with proper technique following cuing and demonstrations with good motivation; no increased pain throughout session. PT will continue progression as able.    Personal Factors and Comorbidities  Fitness;Time since onset of injury/illness/exacerbation;Comorbidity 1;Past/Current Experience;Sex    Comorbidities  PMH R breast cancer    Examination-Activity Limitations  Sleep;Toileting;Reach Overhead;Hygiene/Grooming;Bathing    Examination-Participation Restrictions  Laundry;Cleaning;Yard Work;Meal Prep;Community Activity    Stability/Clinical Decision Making  Evolving/Moderate complexity    Clinical Decision Making  Moderate    Rehab Potential  Good    PT Frequency  2x / week    PT Duration  8 weeks    PT Treatment/Interventions  ADLs/Self Care Home Management;Electrical Stimulation;Therapeutic activities;Patient/family education;Moist Heat;Traction;Cryotherapy;Ultrasound;Functional mobility training;Manual techniques;Dry needling;Neuromuscular re-education;Passive range of motion;Joint Manipulations;Therapeutic exercise;DME Instruction    PT Home Exercise Plan  [email or web address]:    Consulted and Agree with Plan of Care  Patient       Patient will benefit from skilled therapeutic intervention in order to improve the following deficits and impairments:  Pain, Improper body mechanics, Increased fascial restricitons, Decreased mobility, Postural dysfunction, Decreased activity tolerance, Decreased endurance, Decreased range of motion, Decreased strength, Impaired UE functional use, Impaired flexibility  Visit Diagnosis: Chronic right shoulder  pain  Stiffness of right shoulder, not elsewhere classified     Problem List Patient Active Problem List   Diagnosis Date Noted  . Post-operative state 05/22/2018  . Postoperative state 05/22/2018  . Posttraumatic stress disorder 12/09/2016  . Leg edema, right 10/03/2014  . Genetic testing 07/10/2014  . Breast cancer of lower-inner quadrant of right female breast ([place]) 05/29/2014   Shelton Silvas PT, DPT Shelton Silvas 08/10/2019, 10:43 AM  [facility] [address], [place], [address] Phone: [phone]   Fax:  [phone]  Name: SHREENA EILAND MRN: [id number] Date of Birth: [birth year]

## 2019-08-14 ENCOUNTER — Encounter: Payer: Medicare Other | Admitting: Physical Therapy

## 2019-08-14 ENCOUNTER — Ambulatory Visit: Payer: Medicare Other | Admitting: Physical Therapy

## 2019-08-14 ENCOUNTER — Other Ambulatory Visit: Payer: Self-pay

## 2019-08-14 ENCOUNTER — Encounter: Payer: Self-pay | Admitting: Physical Therapy

## 2019-08-14 DIAGNOSIS — M25611 Stiffness of right shoulder, not elsewhere classified: Secondary | ICD-10-CM

## 2019-08-14 DIAGNOSIS — G8929 Other chronic pain: Secondary | ICD-10-CM

## 2019-08-14 DIAGNOSIS — M25511 Pain in right shoulder: Secondary | ICD-10-CM | POA: Diagnosis not present

## 2019-08-14 NOTE — Therapy (Signed)
[facility] [address] [place], [address] Phone: [phone]   Fax:  [phone]  Physical Therapy Treatment  Patient Details  Name: Krystal Kelley MRN: [id number] Date of Birth: [birth year] No data recorded  Encounter Date: 08/14/2019  PT End of Session - 08/14/19 1047    Visit Number  5    Number of Visits  13    Date for PT Re-Evaluation  09/07/19    PT Start Time  [id number]    PT Stop Time  1110    PT Time Calculation (min)  26 min    Activity Tolerance  Patient tolerated treatment well    Behavior During Therapy  [facility] for tasks assessed/performed       Past Medical History:  Diagnosis Date  . Anxiety   . Breast cancer, right breast ([place])   . Depression   . H/O laparoscopy   . History of hysteroscopy   . Peripheral edema    on lasix  . PONV (postoperative nausea and vomiting)   . Radiation 10/30/14-12/17/14   right breast 50.4 gray    Past Surgical History:  Procedure Laterality Date  . BREAST BIOPSY  05/16/14  . BREAST LUMPECTOMY Right 06/18/2014  . bunion surgrery    . CARPAL TUNNEL RELEASE Left 09/21/2016   Procedure: LEFT CARPAL TUNNEL RELEASE;  Surgeon: Daryll Brod, MD;  Location: [facility];  Service: Orthopedics;  Laterality: Left;  REG/FAB  . carpel tunnel    . CHOLECYSTECTOMY  2015  . HYSTERECTOMY ABDOMINAL WITH SALPINGO-OOPHORECTOMY  05/22/2018   Procedure: HYSTERECTOMY ABDOMINAL WITH BILATERAL SALPINGO-OOPHORECTOMY;  Surgeon: [name], MD;  Location: [place];  Service: Gynecology;;  . PLANTAR FASCIECTOMY    . PORTACATH PLACEMENT Left 06/18/2014   Procedure: INSERTION PORT-A-CATH;  Surgeon: Excell Seltzer, MD;  Location: [place] [facility];  Service: General;  Laterality: Left;  . ROBOTIC ASSISTED LAPAROSCOPIC LYSIS OF ADHESION  05/22/2018   Procedure: XI ROBOTIC ASSISTED LAPAROSCOPIC LYSIS OF ADHESION;  Surgeon: [name], MD;   Location: [place];  Service: Gynecology;;  . ROBOTIC ASSISTED TOTAL HYSTERECTOMY WITH BILATERAL SALPINGO OOPHERECTOMY Bilateral 05/22/2018   Procedure: ATTEMPTED XI ROBOTIC ASSISTED TOTAL HYSTERECTOMY WITH BILATERAL SALPINGO OOPHORECTOMY;  Surgeon: [name], MD;  Location: [place];  Service: Gynecology;  Laterality: Bilateral;    There were no vitals filed for this visit.  Subjective Assessment - 08/14/19 1046    Subjective  Patient arrives late to session. Patient reports no pain, compliance with HEP, and is doing well overall.    Pertinent History  Krystal Kelley is a 49 y.o. female with R shoulder pain/stiffness. History of right breast cancer treated with lumpectomy (April 2016), adjuvant chemotherapy with Herceptin, and radiation who is currently on anastrozole. She reports her bilat shoulder motion has been decreasing over the past few years, but the R shoulder motion is worse. Reports bilat shoulder pain that feels like it is in the joint, and is a dull ache. Best pain over the past week 0/10, worst 6/10. Pain brought on by reaching behind her back for toileting and bathing. Patient is on disability but enjoys yard work, but this is somewhat difficult d/t shoulder pain but she "pushes through it". Pt denies N/V, B&B changes, unexplained weight fluctuation, saddle paresthesia, fever, night sweats, or unrelenting night pain at this time.    Limitations  Lifting    How long can you sit comfortably?  unlimited    How long can you stand comfortably?  unlimited    How long can you walk comfortably?  unlimited    Diagnostic tests  not since 2016    Patient Stated Goals  Increase shoulder ROM and decrease pain    Pain Onset  More than a month ago       Ther-Ex UBE 27mins forward/78mins backward for increased endurance of UE use and warm up Bilat Y on wall with RTB x10; GTB 2x 10 with cuing to maintain tension on tband Bent over flys 2# (attempted  3#DB, unable) 3x 10 with demo and max cuing initially for proper technique with good carry over following Standing highrows 10# 3x 10 with good carry over of eccentric control and maintained tension from bent over fly exercise Lat pulldown 35#2x10withbetter carry over of cuing for eccentric control and maintained tension                       PT Education - 08/14/19 1047    Education Details  therex form    Person(s) Educated  Patient    Methods  Explanation;Demonstration;Verbal cues    Comprehension  Verbalized understanding;Returned demonstration;Verbal cues required       PT Short Term Goals - 07/27/19 1029      PT SHORT TERM GOAL #1   Title  Pt will be independent with HEP in order to improve strength and decrease pain in order to improve pain-free function at home and work.    Baseline  07/27/19 HEP given    Time  4    Period  Weeks    Status  New        PT Long Term Goals - 07/27/19 1030      PT LONG TERM GOAL #1   Title  Patient will increase FOTO score to 68 to demonstrate predicted increase in functional mobility to complete ADLs    Baseline  07/27/19 67    Time  6    Period  Weeks    Status  New      PT LONG TERM GOAL #2   Title  Patient will demonstrate at least 4+/5 bilat periscapular strength in order to complete heavy tasks in the yard    Baseline  07/27/19 R/L Latissimus 4/4 Y 3+/3+ T 4-/4-    Time  6    Period  Weeks    Status  New      PT LONG TERM GOAL #3   Title  Patient will report no pain with bathing and toileting in order to increase independence and QOL    Baseline  07/27/19 6/10 with toileting and washing her back in the shower    Time  [place] - 08/14/19 1051    Clinical Impression Statement  PT continued therex progression for increased scauplo-humeral rhythm and strength with good success. Patient continues to need cuing for stability of shoulder girdle and periscapular  musculature with good carry over following. Pt with good motivation throughout session, no increased pain.  PT will continue progression as able.    Personal Factors and Comorbidities  Fitness;Time since onset of injury/illness/exacerbation;Comorbidity 1;Past/Current Experience;Sex    Comorbidities  PMH R breast cancer    Examination-Activity Limitations  Sleep;Toileting;Reach Overhead;Hygiene/Grooming;Bathing    Examination-Participation Restrictions  Laundry;Cleaning;Yard Work;Meal Prep;Community Activity    Stability/Clinical Decision  Making  Evolving/Moderate complexity    Clinical Decision Making  Moderate    Rehab Potential  Good    PT Frequency  2x / week    PT Duration  8 weeks    PT Treatment/Interventions  ADLs/Self Care Home Management;Electrical Stimulation;Therapeutic activities;Patient/family education;Moist Heat;Traction;Cryotherapy;Ultrasound;Functional mobility training;Manual techniques;Dry needling;Neuromuscular re-education;Passive range of motion;Joint Manipulations;Therapeutic exercise;DME Instruction    PT Home Exercise Plan  [email or web address]:    Consulted and Agree with Plan of Care  Patient       Patient will benefit from skilled therapeutic intervention in order to improve the following deficits and impairments:  Pain, Improper body mechanics, Increased fascial restricitons, Decreased mobility, Postural dysfunction, Decreased activity tolerance, Decreased endurance, Decreased range of motion, Decreased strength, Impaired UE functional use, Impaired flexibility  Visit Diagnosis: Chronic right shoulder pain  Stiffness of right shoulder, not elsewhere classified     Problem List Patient Active Problem List   Diagnosis Date Noted  . Post-operative state 05/22/2018  . Postoperative state 05/22/2018  . Posttraumatic stress disorder 12/09/2016  . Leg edema, right 10/03/2014  . Genetic testing 07/10/2014  . Breast cancer of lower-inner quadrant of right female breast  ([place]) 05/29/2014   Shelton Silvas PT, DPT Shelton Silvas 08/14/2019, 11:10 AM  [facility] [address], [place], [address] Phone: [phone]   Fax:  [phone]  Name: Krystal Kelley MRN: [id number] Date of Birth: 12-25-1970

## 2019-08-17 ENCOUNTER — Encounter: Payer: Medicare Other | Admitting: Physical Therapy

## 2019-08-17 ENCOUNTER — Ambulatory Visit: Payer: Medicare Other

## 2019-08-17 ENCOUNTER — Other Ambulatory Visit: Payer: Self-pay

## 2019-08-17 DIAGNOSIS — M25611 Stiffness of right shoulder, not elsewhere classified: Secondary | ICD-10-CM

## 2019-08-17 DIAGNOSIS — G8929 Other chronic pain: Secondary | ICD-10-CM

## 2019-08-17 DIAGNOSIS — M25511 Pain in right shoulder: Secondary | ICD-10-CM | POA: Diagnosis not present

## 2019-08-17 NOTE — Therapy (Signed)
[facility] [address], [place], [address] Phone: [phone]   Fax:  [phone]  Physical Therapy Treatment  Patient Details  Name: Krystal Kelley MRN: [id number] Date of Birth: [birth year] No data recorded  Encounter Date: 08/17/2019  PT End of Session - 08/17/19 1010    Visit Number  6    Number of Visits  13    Date for PT Re-Evaluation  09/07/19    PT Start Time  1005    PT Stop Time  1045    PT Time Calculation (min)  40 min    Activity Tolerance  Patient tolerated treatment well    Behavior During Therapy  [facility] for tasks assessed/performed       Past Medical History:  Diagnosis Date  . Anxiety   . Breast cancer, right breast ([place])   . Depression   . H/O laparoscopy   . History of hysteroscopy   . Peripheral edema    on lasix  . PONV (postoperative nausea and vomiting)   . Radiation 10/30/14-12/17/14   right breast 50.4 gray    Past Surgical History:  Procedure Laterality Date  . BREAST BIOPSY  05/16/14  . BREAST LUMPECTOMY Right 06/18/2014  . bunion surgrery    . CARPAL TUNNEL RELEASE Left 09/21/2016   Procedure: LEFT CARPAL TUNNEL RELEASE;  Surgeon: Daryll Brod, MD;  Location: [facility];  Service: Orthopedics;  Laterality: Left;  REG/FAB  . carpel tunnel    . CHOLECYSTECTOMY  2015  . HYSTERECTOMY ABDOMINAL WITH SALPINGO-OOPHORECTOMY  05/22/2018   Procedure: HYSTERECTOMY ABDOMINAL WITH BILATERAL SALPINGO-OOPHORECTOMY;  Surgeon: [name], MD;  Location: [facility];  Service: Gynecology;;  . PLANTAR FASCIECTOMY    . PORTACATH PLACEMENT Left 06/18/2014   Procedure: INSERTION PORT-A-CATH;  Surgeon: Excell Seltzer, MD;  Location: [facility];  Service: General;  Laterality: Left;  . ROBOTIC ASSISTED LAPAROSCOPIC LYSIS OF ADHESION  05/22/2018   Procedure: XI ROBOTIC ASSISTED LAPAROSCOPIC LYSIS OF ADHESION;  Surgeon: [name], MD;   Location: [facility];  Service: Gynecology;;  . ROBOTIC ASSISTED TOTAL HYSTERECTOMY WITH BILATERAL SALPINGO OOPHERECTOMY Bilateral 05/22/2018   Procedure: ATTEMPTED XI ROBOTIC ASSISTED TOTAL HYSTERECTOMY WITH BILATERAL SALPINGO OOPHORECTOMY;  Surgeon: [name], MD;  Location: [facility];  Service: Gynecology;  Laterality: Bilateral;    There were no vitals filed for this visit.  Subjective Assessment - 08/17/19 1009    Subjective  Pt doing well today. At the podiatrist yesterday, PF still bother her and now dorsal foot and knee ramifications. Shoulder feeling ok today. no other updates.    Pertinent History  Krystal Kelley is a 49 y.o. female with R shoulder pain/stiffness. History of right breast cancer treated with lumpectomy (April 2016), adjuvant chemotherapy with Herceptin, and radiation who is currently on anastrozole. She reports her bilat shoulder motion has been decreasing over the past few years, but the R shoulder motion is worse. Reports bilat shoulder pain that feels like it is in the joint, and is a dull ache. Best pain over the past week 0/10, worst 6/10. Pain brought on by reaching behind her back for toileting and bathing. Patient is on disability but enjoys yard work, but this is somewhat difficult d/t shoulder pain but she "pushes through it". Pt denies N/V, B&B changes, unexplained weight fluctuation, saddle paresthesia, fever, night sweats, or unrelenting night pain at this time.    Currently in Pain?  No/denies      INTERVENTION THIS DATE UBE 69mins forward/31mins backward for increased endurance of UE use and warm up Bent over flys 2# 3x10 with demo and max cuing initially for proper technique with good carry over following Lat pulldown 35# 2 x10  with better carry over of cuing for eccentric control and maintained tension (cued posterio trunk sway for improved scapular retraction/depression moment) Shrugs for scapular elevation  (10lb  each UE) 2x10  Standing high rows 10# 1x10, 15# 1x10 (increased to 15lbs this date, tolerated well) Standing Cable Upright Row 2x10 Bilat Y on wall with RTB x10; GTB 2x 10 with cuing to maintain tension on tband     PT Short Term Goals - 07/27/19 1029      PT SHORT TERM GOAL #1   Title  Pt will be independent with HEP in order to improve strength and decrease pain in order to improve pain-free function at home and work.    Baseline  07/27/19 HEP given    Time  4    Period  Weeks    Status  New        PT Long Term Goals - 07/27/19 1030      PT LONG TERM GOAL #1   Title  Patient will increase FOTO score to 68 to demonstrate predicted increase in functional mobility to complete ADLs    Baseline  07/27/19 67    Time  6    Period  Weeks    Status  New      PT LONG TERM GOAL #2   Title  Patient will demonstrate at least 4+/5 bilat periscapular strength in order to complete heavy tasks in the yard    Baseline  07/27/19 R/L Latissimus 4/4 Y 3+/3+ T 4-/4-    Time  6    Period  Weeks    Status  New      PT LONG TERM GOAL #3   Title  Patient will report no pain with bathing and toileting in order to increase independence and QOL    Baseline  07/27/19 6/10 with toileting and washing her back in the shower    Time  [place] - 08/17/19 1013    Clinical Impression Statement Pt able to complete entire session as planned with rest breaks provided as needed. Pt maintains high level of focus and motivation. Extensive verbal, visual, and tactile cues are provided for most accurate form possible. Author provides minA intermittently for full ROM when needed. Incorporated upright row this date and shrugs with good carryover of prior cues. Overall pt continues to make steady progress toward treatment goals.    Personal Factors and Comorbidities  Fitness;Time since onset of injury/illness/exacerbation;Comorbidity 1;Past/Current Experience;Sex     Comorbidities  PMH R breast cancer    Examination-Activity Limitations  Sleep;Toileting;Reach Overhead;Hygiene/Grooming;Bathing    Examination-Participation Restrictions  Laundry;Cleaning;Yard Work;Meal Prep;Community Activity    Stability/Clinical Decision Making  Evolving/Moderate complexity    Clinical Decision Making  Moderate    Rehab Potential  Good    PT Frequency  2x / week    PT Duration  8 weeks    PT Treatment/Interventions  ADLs/Self Care Home Management;Electrical Stimulation;Therapeutic activities;Patient/family education;Moist Heat;Traction;Cryotherapy;Ultrasound;Functional mobility training;Manual techniques;Dry needling;Neuromuscular re-education;Passive range of motion;Joint Manipulations;Therapeutic exercise;DME Instruction    PT Home Exercise Plan  [email or web address]:    Consulted and Agree with  Plan of Care  Patient       Patient will benefit from skilled therapeutic intervention in order to improve the following deficits and impairments:  Pain, Improper body mechanics, Increased fascial restricitons, Decreased mobility, Postural dysfunction, Decreased activity tolerance, Decreased endurance, Decreased range of motion, Decreased strength, Impaired UE functional use, Impaired flexibility  Visit Diagnosis: Chronic right shoulder pain  Stiffness of right shoulder, not elsewhere classified     Problem List Patient Active Problem List   Diagnosis Date Noted  . Post-operative state 05/22/2018  . Postoperative state 05/22/2018  . Posttraumatic stress disorder 12/09/2016  . Leg edema, right 10/03/2014  . Genetic testing 07/10/2014  . Breast cancer of lower-inner quadrant of right female breast ([place]) 05/29/2014   10:43 AM, 08/17/19 Etta Grandchild, PT, DPT Physical Therapist - [place] [phone] (Office)   Wren Pryce C 08/17/2019, 10:15 AM  [facility] [address] [place],  [address] Phone: [phone]   Fax:  [phone]  Name: Krystal Kelley MRN: [id number] Date of Birth: [birth year]

## 2019-08-22 ENCOUNTER — Ambulatory Visit: Payer: Medicare Other | Attending: Internal Medicine | Admitting: Physical Therapy

## 2019-08-22 ENCOUNTER — Other Ambulatory Visit: Payer: Self-pay

## 2019-08-22 ENCOUNTER — Encounter: Payer: Self-pay | Admitting: Physical Therapy

## 2019-08-22 DIAGNOSIS — G8929 Other chronic pain: Secondary | ICD-10-CM | POA: Insufficient documentation

## 2019-08-22 DIAGNOSIS — M25511 Pain in right shoulder: Secondary | ICD-10-CM | POA: Diagnosis present

## 2019-08-22 DIAGNOSIS — M25611 Stiffness of right shoulder, not elsewhere classified: Secondary | ICD-10-CM | POA: Diagnosis present

## 2019-08-22 NOTE — Therapy (Signed)
[facility] [address], [place], [address] Phone: [phone]   Fax:  [phone]  Physical Therapy Treatment  Patient Details  Name: Krystal Kelley MRN: [id number] Date of Birth: Jul 31, 1970 No data recorded  Encounter Date: 08/22/2019  PT End of Session - 08/22/19 0917    Visit Number  7    Number of Visits  13    Date for PT Re-Evaluation  09/07/19    PT Start Time  0907    PT Stop Time  0945    PT Time Calculation (min)  38 min    Activity Tolerance  Patient tolerated treatment well    Behavior During Therapy  [facility] for tasks assessed/performed       Past Medical History:  Diagnosis Date  . Anxiety   . Breast cancer, right breast ([place])   . Depression   . H/O laparoscopy   . History of hysteroscopy   . Peripheral edema    on lasix  . PONV (postoperative nausea and vomiting)   . Radiation 10/30/14-12/17/14   right breast 50.4 gray    Past Surgical History:  Procedure Laterality Date  . BREAST BIOPSY  05/16/14  . BREAST LUMPECTOMY Right 06/18/2014  . bunion surgrery    . CARPAL TUNNEL RELEASE Left 09/21/2016   Procedure: LEFT CARPAL TUNNEL RELEASE;  Surgeon: Daryll Brod, MD;  Location: [facility];  Service: Orthopedics;  Laterality: Left;  REG/FAB  . carpel tunnel    . CHOLECYSTECTOMY  2015  . HYSTERECTOMY ABDOMINAL WITH SALPINGO-OOPHORECTOMY  05/22/2018   Procedure: HYSTERECTOMY ABDOMINAL WITH BILATERAL SALPINGO-OOPHORECTOMY;  Surgeon: [name], MD;  Location: [facility];  Service: Gynecology;;  . PLANTAR FASCIECTOMY    . PORTACATH PLACEMENT Left 06/18/2014   Procedure: INSERTION PORT-A-CATH;  Surgeon: Excell Seltzer, MD;  Location: [facility];  Service: General;  Laterality: Left;  . ROBOTIC ASSISTED LAPAROSCOPIC LYSIS OF ADHESION  05/22/2018   Procedure: XI ROBOTIC ASSISTED LAPAROSCOPIC LYSIS OF ADHESION;  Surgeon: [name], MD;   Location: [facility];  Service: Gynecology;;  . ROBOTIC ASSISTED TOTAL HYSTERECTOMY WITH BILATERAL SALPINGO OOPHERECTOMY Bilateral 05/22/2018   Procedure: ATTEMPTED XI ROBOTIC ASSISTED TOTAL HYSTERECTOMY WITH BILATERAL SALPINGO OOPHORECTOMY;  Surgeon: [name], MD;  Location: [facility];  Service: Gynecology;  Laterality: Bilateral;    There were no vitals filed for this visit.  Subjective Assessment - 08/22/19 0911    Subjective  Patient reports she is doing well. She got a part time job over the weekend and is working 4 hours a day, which is making her tired, as she reports she has not worked in 5 years. She reports she is having increased muscle cramps all over from being on her feet more. No shoulder pain today, compliance with HEP.    Pertinent History  Krystal Kelley is a 49 y.o. female with R shoulder pain/stiffness. History of right breast cancer treated with lumpectomy (April 2016), adjuvant chemotherapy with Herceptin, and radiation who is currently on anastrozole. She reports her bilat shoulder motion has been decreasing over the past few years, but the R shoulder motion is worse. Reports bilat shoulder pain that feels like it is in the joint, and is a dull ache. Best pain over the past week 0/10, worst 6/10. Pain brought on by reaching behind her back for toileting and bathing. Patient is on disability but enjoys yard work, but this is somewhat difficult  d/t shoulder pain but she "pushes through it". Pt denies N/V, B&B changes, unexplained weight fluctuation, saddle paresthesia, fever, night sweats, or unrelenting night pain at this time.    Limitations  Lifting    How long can you sit comfortably?  unlimited    How long can you stand comfortably?  unlimited    How long can you walk comfortably?  unlimited    Diagnostic tests  not since 2016    Patient Stated Goals  Increase shoulder ROM and decrease pain         Ther-Ex UBE 60mins  forward/75mins backward for increased endurance of UE use and warm up Standing Cable Upright Row 3x 10 10# with good carry over of demo  Bent over flys 3# 3x10 with cuing to maintain scapular retraction with lower with good carry over Lat pulldown 35#2x10withbetter carry over of cuing for eccentric control and maintained tension (cued posterio trunk sway for improved scapular retraction/depression moment) Shrugs for scapular elevation  (10lb each UE) 2x10  Farmers carry 10# DB bilat  x175ft with max cuing for set up posture with good carry over following; RUE only 10# 2x 165ft      PT Education - 08/22/19 0913    Education Details  therex form    Person(s) Educated  Patient    Methods  Explanation;Demonstration;Verbal cues    Comprehension  Verbalized understanding;Returned demonstration;Verbal cues required       PT Short Term Goals - 07/27/19 1029      PT SHORT TERM GOAL #1   Title  Pt will be independent with HEP in order to improve strength and decrease pain in order to improve pain-free function at home and work.    Baseline  07/27/19 HEP given    Time  4    Period  Weeks    Status  New        PT Long Term Goals - 07/27/19 1030      PT LONG TERM GOAL #1   Title  Patient will increase FOTO score to 68 to demonstrate predicted increase in functional mobility to complete ADLs    Baseline  07/27/19 67    Time  6    Period  Weeks    Status  New      PT LONG TERM GOAL #2   Title  Patient will demonstrate at least 4+/5 bilat periscapular strength in order to complete heavy tasks in the yard    Baseline  07/27/19 R/L Latissimus 4/4 Y 3+/3+ T 4-/4-    Time  6    Period  Weeks    Status  New      PT LONG TERM GOAL #3   Title  Patient will report no pain with bathing and toileting in order to increase independence and QOL    Baseline  07/27/19 6/10 with toileting and washing her back in the shower    Time  [place] - 08/22/19  [id number]    Clinical Impression Statement  PT continued therex progression for increased shoulder/scapular stability with good success. Patient is able to complete all therex with proper technique following demo/cuing, and demonstrates good motivation throughout session, without increased pain. PT will continue progression as able.    Personal Factors and Comorbidities  Fitness;Time since onset of injury/illness/exacerbation;Comorbidity 1;Past/Current Experience;Sex    Comorbidities  PMH R breast cancer  Examination-Activity Limitations  Sleep;Toileting;Reach Overhead;Hygiene/Grooming;Bathing    Examination-Participation Restrictions  Laundry;Cleaning;Yard Work;Meal Prep;Community Activity    Stability/Clinical Decision Making  Evolving/Moderate complexity    Clinical Decision Making  Moderate    Rehab Potential  Good    PT Frequency  2x / week    PT Duration  8 weeks    PT Treatment/Interventions  ADLs/Self Care Home Management;Electrical Stimulation;Therapeutic activities;Patient/family education;Moist Heat;Traction;Cryotherapy;Ultrasound;Functional mobility training;Manual techniques;Dry needling;Neuromuscular re-education;Passive range of motion;Joint Manipulations;Therapeutic exercise;DME Instruction    PT Home Exercise Plan  [email or web address]:    Consulted and Agree with Plan of Care  Patient       Patient will benefit from skilled therapeutic intervention in order to improve the following deficits and impairments:  Pain, Improper body mechanics, Increased fascial restricitons, Decreased mobility, Postural dysfunction, Decreased activity tolerance, Decreased endurance, Decreased range of motion, Decreased strength, Impaired UE functional use, Impaired flexibility  Visit Diagnosis: Chronic right shoulder pain  Stiffness of right shoulder, not elsewhere classified     Problem List Patient Active Problem List   Diagnosis Date Noted  . Post-operative state 05/22/2018  . Postoperative state  05/22/2018  . Posttraumatic stress disorder 12/09/2016  . Leg edema, right 10/03/2014  . Genetic testing 07/10/2014  . Breast cancer of lower-inner quadrant of right female breast ([place]) 05/29/2014   Shelton Silvas PT, DPT Shelton Silvas 08/22/2019, 9:31 AM  [facility] [address] [place], [address] Phone: [phone]   Fax:  [phone]  Name: Krystal Kelley MRN: [id number] Date of Birth: Oct 26, 1970

## 2019-08-23 ENCOUNTER — Telehealth: Payer: Self-pay | Admitting: *Deleted

## 2019-08-23 NOTE — Telephone Encounter (Signed)
Have her in ASAP even if Ruby Cola has to see her.

## 2019-08-23 NOTE — Telephone Encounter (Signed)
"  I am having pain where I had the injections.  The shots didn't help it all the way.  This pain is shooting through the top of my foot.  What can I do for it?  My next appointment isn't until June.  I need to see him sooner than that.  I know he's not there today."  I can schedule you an appointment for May 19.  Are you taking any anti-inflammatories?  "May 19 is fine.  I am not taking anything, maybe that will help."  I'll send a message to the doctor and see what he recommends.

## 2019-08-23 NOTE — Telephone Encounter (Signed)
I called and offered the patient an appointment at 11:15 am tomorrow morning to see Dr. Amalia Hailey.  She stated she wanted to see Dr. Milinda Pointer.  I walked her in on Monday, 08/27/2019 at 8:15 am.  She said she got off her foot and the shooting pains have stopped.

## 2019-08-24 ENCOUNTER — Ambulatory Visit: Payer: Medicare Other | Admitting: Physical Therapy

## 2019-08-27 ENCOUNTER — Other Ambulatory Visit: Payer: Self-pay

## 2019-08-27 ENCOUNTER — Ambulatory Visit (INDEPENDENT_AMBULATORY_CARE_PROVIDER_SITE_OTHER): Payer: Medicare Other | Admitting: Podiatry

## 2019-08-27 ENCOUNTER — Encounter: Payer: Self-pay | Admitting: Podiatry

## 2019-08-27 DIAGNOSIS — M778 Other enthesopathies, not elsewhere classified: Secondary | ICD-10-CM | POA: Diagnosis not present

## 2019-08-27 DIAGNOSIS — M7751 Other enthesopathy of right foot: Secondary | ICD-10-CM

## 2019-08-27 NOTE — Progress Notes (Signed)
She presents today states that she was still having pain across the top of her foot she says overhearing particularly sore she points to the anterior most portion of the sinus tarsi area.  She states that after the last injection I started having electrical shock type feelings in my foot and she says it started to resolve and it feels better than it did before the injection but is still not quite gone away all the way.  Objective: Vital signs are stable she is alert and oriented x3 moderate flatfoot deformity with tenderness on end range of motion of the subtalar joint and tenderness on palpation of the sinus tarsi.  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  Plan: I injected 10 mg Kenalog 5 mg Marcaine to the point of maximal tenderness today.  She tolerated procedure well and I will follow-up with her in 1 month at which time we will reevaluate for plantar fasciitis as well

## 2019-08-29 ENCOUNTER — Ambulatory Visit: Payer: Medicare Other | Admitting: Podiatry

## 2019-08-29 ENCOUNTER — Ambulatory Visit: Payer: Medicare Other | Admitting: Physical Therapy

## 2019-08-29 ENCOUNTER — Encounter: Payer: Self-pay | Admitting: Physical Therapy

## 2019-08-29 DIAGNOSIS — M25611 Stiffness of right shoulder, not elsewhere classified: Secondary | ICD-10-CM

## 2019-08-29 DIAGNOSIS — M25511 Pain in right shoulder: Secondary | ICD-10-CM | POA: Diagnosis not present

## 2019-08-29 DIAGNOSIS — G8929 Other chronic pain: Secondary | ICD-10-CM

## 2019-08-29 NOTE — Therapy (Signed)
[facility] [address] [place], [address] Phone: [phone]   Fax:  [phone]  Physical Therapy Treatment  Patient Details  Name: Krystal Kelley MRN: [id number] Date of Birth: 1970/08/26 No data recorded  Encounter Date: 08/29/2019  PT End of Session - 08/29/19 0916    Visit Number  8    Number of Visits  13    Date for PT Re-Evaluation  09/07/19    PT Start Time  0909    PT Stop Time  0947    PT Time Calculation (min)  38 min    Activity Tolerance  Patient tolerated treatment well    Behavior During Therapy  [facility] for tasks assessed/performed       Past Medical History:  Diagnosis Date  . Anxiety   . Breast cancer, right breast ([place])   . Depression   . H/O laparoscopy   . History of hysteroscopy   . Peripheral edema    on lasix  . PONV (postoperative nausea and vomiting)   . Radiation 10/30/14-12/17/14   right breast 50.4 gray    Past Surgical History:  Procedure Laterality Date  . BREAST BIOPSY  05/16/14  . BREAST LUMPECTOMY Right 06/18/2014  . bunion surgrery    . CARPAL TUNNEL RELEASE Left 09/21/2016   Procedure: LEFT CARPAL TUNNEL RELEASE;  Surgeon: Daryll Brod, MD;  Location: [facility];  Service: Orthopedics;  Laterality: Left;  REG/FAB  . carpel tunnel    . CHOLECYSTECTOMY  2015  . HYSTERECTOMY ABDOMINAL WITH SALPINGO-OOPHORECTOMY  05/22/2018   Procedure: HYSTERECTOMY ABDOMINAL WITH BILATERAL SALPINGO-OOPHORECTOMY;  Surgeon: [name], MD;  Location: [facility];  Service: Gynecology;;  . PLANTAR FASCIECTOMY    . PORTACATH PLACEMENT Left 06/18/2014   Procedure: INSERTION PORT-A-CATH;  Surgeon: Excell Seltzer, MD;  Location: [facility];  Service: General;  Laterality: Left;  . ROBOTIC ASSISTED LAPAROSCOPIC LYSIS OF ADHESION  05/22/2018   Procedure: XI ROBOTIC ASSISTED LAPAROSCOPIC LYSIS OF ADHESION;  Surgeon: [name], MD;   Location: [facility];  Service: Gynecology;;  . ROBOTIC ASSISTED TOTAL HYSTERECTOMY WITH BILATERAL SALPINGO OOPHERECTOMY Bilateral 05/22/2018   Procedure: ATTEMPTED XI ROBOTIC ASSISTED TOTAL HYSTERECTOMY WITH BILATERAL SALPINGO OOPHORECTOMY;  Surgeon: [name], MD;  Location: [facility];  Service: Gynecology;  Laterality: Bilateral;    There were no vitals filed for this visit.  Subjective Assessment - 08/29/19 0915    Subjective  Patient reports no pain this am, reports that she felt good over the weekend. Reports good compliance with HEP.    Pertinent History  Krystal Kelley is a 49 y.o. female with R shoulder pain/stiffness. History of right breast cancer treated with lumpectomy (April 2016), adjuvant chemotherapy with Herceptin, and radiation who is currently on anastrozole. She reports her bilat shoulder motion has been decreasing over the past few years, but the R shoulder motion is worse. Reports bilat shoulder pain that feels like it is in the joint, and is a dull ache. Best pain over the past week 0/10, worst 6/10. Pain brought on by reaching behind her back for toileting and bathing. Patient is on disability but enjoys yard work, but this is somewhat difficult d/t shoulder pain but she "pushes through it". Pt denies N/V, B&B changes, unexplained weight fluctuation, saddle paresthesia, fever, night sweats, or unrelenting night pain at this time.    Limitations  Lifting    How long can you  sit comfortably?  unlimited    How long can you stand comfortably?  unlimited    How long can you walk comfortably?  unlimited    Diagnostic tests  not since 2016    Patient Stated Goals  Increase shoulder ROM and decrease pain    Pain Onset  More than a month ago          Ther-Ex UBE 38mins forward/46mins backward for increased endurance of UE use and warm up Standing High Row 3x 10 15# with min cuing for correction with good carry over Farmers carry 10# DB  RUE 2x 159ft with cuing needed for set up posture with good carry over following Farmers overhead carry 5# DB 2x 24ft with cuing for set up with good carry over Shrugs for scapular elevation (10lb RUE) 3x 10 with min cuing to prevent lateral shift with good carry over following Standing bilat scaption 3# DB with demo and heavy cuing for proper technique with good carry over Lat pulldown 35#2x10; 45# x10 withbetter carry over of cuing for eccentric control and maintained tension with min cuing needed                          PT Education - 08/29/19 0916    Education Details  therex form    Person(s) Educated  Patient    Methods  Explanation;Demonstration;Verbal cues;Tactile cues    Comprehension  Verbalized understanding;Returned demonstration;Verbal cues required;Tactile cues required       PT Short Term Goals - 07/27/19 1029      PT SHORT TERM GOAL #1   Title  Pt will be independent with HEP in order to improve strength and decrease pain in order to improve pain-free function at home and work.    Baseline  07/27/19 HEP given    Time  4    Period  Weeks    Status  New        PT Long Term Goals - 07/27/19 1030      PT LONG TERM GOAL #1   Title  Patient will increase FOTO score to 68 to demonstrate predicted increase in functional mobility to complete ADLs    Baseline  07/27/19 67    Time  6    Period  Weeks    Status  New      PT LONG TERM GOAL #2   Title  Patient will demonstrate at least 4+/5 bilat periscapular strength in order to complete heavy tasks in the yard    Baseline  07/27/19 R/L Latissimus 4/4 Y 3+/3+ T 4-/4-    Time  6    Period  Weeks    Status  New      PT LONG TERM GOAL #3   Title  Patient will report no pain with bathing and toileting in order to increase independence and QOL    Baseline  07/27/19 6/10 with toileting and washing her back in the shower    Time  [place] - 08/29/19  [id number]    Clinical Impression Statement  Pt arrived late to session, shortened accordingly. PT continued therex progression for increased mobility and overhead strenghtening with good success. Patient is able to comply with cuing for proper technique of all therex with good motivation throughout session. PT will continue progression as able.    Personal  Factors and Comorbidities  Fitness;Time since onset of injury/illness/exacerbation;Comorbidity 1;Past/Current Experience;Sex    Comorbidities  PMH R breast cancer    Examination-Activity Limitations  Sleep;Toileting;Reach Overhead;Hygiene/Grooming;Bathing    Examination-Participation Restrictions  Laundry;Cleaning;Yard Work;Meal Prep;Community Activity    Stability/Clinical Decision Making  Evolving/Moderate complexity    Clinical Decision Making  Moderate    Rehab Potential  Good    PT Frequency  2x / week    PT Duration  8 weeks    PT Treatment/Interventions  ADLs/Self Care Home Management;Electrical Stimulation;Therapeutic activities;Patient/family education;Moist Heat;Traction;Cryotherapy;Ultrasound;Functional mobility training;Manual techniques;Dry needling;Neuromuscular re-education;Passive range of motion;Joint Manipulations;Therapeutic exercise;DME Instruction    PT Home Exercise Plan  [email or web address]:    Consulted and Agree with Plan of Care  Patient       Patient will benefit from skilled therapeutic intervention in order to improve the following deficits and impairments:  Pain, Improper body mechanics, Increased fascial restricitons, Decreased mobility, Postural dysfunction, Decreased activity tolerance, Decreased endurance, Decreased range of motion, Decreased strength, Impaired UE functional use, Impaired flexibility  Visit Diagnosis: Chronic right shoulder pain  Stiffness of right shoulder, not elsewhere classified     Problem List Patient Active Problem List   Diagnosis Date Noted  . Post-operative state 05/22/2018  .  Postoperative state 05/22/2018  . Posttraumatic stress disorder 12/09/2016  . Leg edema, right 10/03/2014  . Genetic testing 07/10/2014  . Breast cancer of lower-inner quadrant of right female breast ([place]) 05/29/2014   Shelton Silvas PT, DPT Shelton Silvas 08/29/2019, 9:43 AM  [facility] [address], [place], [address] Phone: [phone]   Fax:  [phone]  Name: Krystal Kelley MRN: [id number] Date of Birth: 05-Oct-1970

## 2019-08-30 ENCOUNTER — Other Ambulatory Visit: Payer: Self-pay | Admitting: Hematology and Oncology

## 2019-08-31 ENCOUNTER — Encounter: Payer: Self-pay | Admitting: Physical Therapy

## 2019-08-31 ENCOUNTER — Ambulatory Visit: Payer: Medicare Other | Admitting: Physical Therapy

## 2019-08-31 ENCOUNTER — Other Ambulatory Visit: Payer: Self-pay

## 2019-08-31 DIAGNOSIS — M25611 Stiffness of right shoulder, not elsewhere classified: Secondary | ICD-10-CM

## 2019-08-31 DIAGNOSIS — M25511 Pain in right shoulder: Secondary | ICD-10-CM

## 2019-08-31 DIAGNOSIS — G8929 Other chronic pain: Secondary | ICD-10-CM

## 2019-08-31 NOTE — Therapy (Signed)
[facility] [address], [place], [address] Phone: [phone]   Fax:  [phone]  Physical Therapy Treatment  Patient Details  Name: Krystal Kelley MRN: [id number] Date of Birth: [birth year] No data recorded  Encounter Date: 08/31/2019  PT End of Session - 08/31/19 1012    Visit Number  9    Number of Visits  13    Date for PT Re-Evaluation  09/07/19    PT Start Time  1008    PT Stop Time  1046    PT Time Calculation (min)  38 min    Activity Tolerance  Patient tolerated treatment well    Behavior During Therapy  [facility] for tasks assessed/performed       Past Medical History:  Diagnosis Date  . Anxiety   . Breast cancer, right breast ([place])   . Depression   . H/O laparoscopy   . History of hysteroscopy   . Peripheral edema    on lasix  . PONV (postoperative nausea and vomiting)   . Radiation 10/30/14-12/17/14   right breast 50.4 gray    Past Surgical History:  Procedure Laterality Date  . BREAST BIOPSY  05/16/14  . BREAST LUMPECTOMY Right 06/18/2014  . bunion surgrery    . CARPAL TUNNEL RELEASE Left 09/21/2016   Procedure: LEFT CARPAL TUNNEL RELEASE;  Surgeon: Daryll Brod, MD;  Location: [facility];  Service: Orthopedics;  Laterality: Left;  REG/FAB  . carpel tunnel    . CHOLECYSTECTOMY  2015  . HYSTERECTOMY ABDOMINAL WITH SALPINGO-OOPHORECTOMY  05/22/2018   Procedure: HYSTERECTOMY ABDOMINAL WITH BILATERAL SALPINGO-OOPHORECTOMY;  Surgeon: [name], MD;  Location: [facility];  Service: Gynecology;;  . PLANTAR FASCIECTOMY    . PORTACATH PLACEMENT Left 06/18/2014   Procedure: INSERTION PORT-A-CATH;  Surgeon: Excell Seltzer, MD;  Location: [facility];  Service: General;  Laterality: Left;  . ROBOTIC ASSISTED LAPAROSCOPIC LYSIS OF ADHESION  05/22/2018   Procedure: XI ROBOTIC ASSISTED LAPAROSCOPIC LYSIS OF ADHESION;  Surgeon: [name], MD;   Location: [facility];  Service: Gynecology;;  . ROBOTIC ASSISTED TOTAL HYSTERECTOMY WITH BILATERAL SALPINGO OOPHERECTOMY Bilateral 05/22/2018   Procedure: ATTEMPTED XI ROBOTIC ASSISTED TOTAL HYSTERECTOMY WITH BILATERAL SALPINGO OOPHORECTOMY;  Surgeon: [name], MD;  Location: [facility];  Service: Gynecology;  Laterality: Bilateral;    There were no vitals filed for this visit.  Subjective Assessment - 08/31/19 1010    Subjective  Patient arrives late to session again. Reports no pain this session, and compliance with HEP. She was a little sore following last session, but felt good overall.    Pertinent History  Krystal Kelley is a 49 y.o. female with R shoulder pain/stiffness. History of right breast cancer treated with lumpectomy (April 2016), adjuvant chemotherapy with Herceptin, and radiation who is currently on anastrozole. She reports her bilat shoulder motion has been decreasing over the past few years, but the R shoulder motion is worse. Reports bilat shoulder pain that feels like it is in the joint, and is a dull ache. Best pain over the past week 0/10, worst 6/10. Pain brought on by reaching behind her back for toileting and bathing. Patient is on disability but enjoys yard work, but this is somewhat difficult d/t shoulder pain but she "pushes through it". Pt denies N/V, B&B changes, unexplained weight fluctuation, saddle paresthesia, fever, night sweats, or unrelenting night pain at this time.    Limitations  Lifting    How long can you sit comfortably?  unlimited    How long can you stand comfortably?  unlimited    How long can you walk comfortably?  unlimited    Diagnostic tests  not since 2016    Patient Stated Goals  Increase shoulder ROM and decrease pain    Pain Onset  More than a month ago           Ther-Ex UBE 2mins forward/41mins backward for increased endurance of UE use and warm up Standing bilat scaption 3# DB 3x 10 with demo  and heavy cuing for proper technique with good carry over Seated military press 3# DB bilat 3x 10 with cuing for set up, good carry over Standing High Row3x 10 15# with min cuing for eccentric control  Lat pulldown 45# 3 x10 withbetter carry over of cuing for eccentric control and maintained tension with min cuing needed Shrugs for scapular elevation (10lb RUE) 3x 10with min posutral cuing with good carry over Bent over flys 2# DB bilat 3x 10 with min cuing for set up with good carry over                             PT Education - 08/31/19 1011    Education Details  therex form/technique    Person(s) Educated  Patient    Methods  Explanation;Demonstration;Verbal cues;Tactile cues    Comprehension  Verbalized understanding;Returned demonstration;Verbal cues required;Tactile cues required       PT Short Term Goals - 07/27/19 1029      PT SHORT TERM GOAL #1   Title  Pt will be independent with HEP in order to improve strength and decrease pain in order to improve pain-free function at home and work.    Baseline  07/27/19 HEP given    Time  4    Period  Weeks    Status  New        PT Long Term Goals - 07/27/19 1030      PT LONG TERM GOAL #1   Title  Patient will increase FOTO score to 68 to demonstrate predicted increase in functional mobility to complete ADLs    Baseline  07/27/19 67    Time  6    Period  Weeks    Status  New      PT LONG TERM GOAL #2   Title  Patient will demonstrate at least 4+/5 bilat periscapular strength in order to complete heavy tasks in the yard    Baseline  07/27/19 R/L Latissimus 4/4 Y 3+/3+ T 4-/4-    Time  6    Period  Weeks    Status  New      PT LONG TERM GOAL #3   Title  Patient will report no pain with bathing and toileting in order to increase independence and QOL    Baseline  07/27/19 6/10 with toileting and washing her back in the shower    Time  [place] -  08/31/19 1014    Clinical Impression Statement  Pt arrived late to session, shortened accordingly. PT continued therex progression for increased mobility and overhead strength with success. Pt is motivated throughout session and able to comply with all cuing for proper technique without increased pain. PT will continue progression as able.  Personal Factors and Comorbidities  Fitness;Time since onset of injury/illness/exacerbation;Comorbidity 1;Past/Current Experience;Sex    Comorbidities  PMH R breast cancer    Examination-Activity Limitations  Sleep;Toileting;Reach Overhead;Hygiene/Grooming;Bathing    Examination-Participation Restrictions  Laundry;Cleaning;Yard Work;Meal Prep;Community Activity    Stability/Clinical Decision Making  Evolving/Moderate complexity    Clinical Decision Making  Moderate    Rehab Potential  Good    PT Frequency  2x / week    PT Duration  8 weeks    PT Treatment/Interventions  ADLs/Self Care Home Management;Electrical Stimulation;Therapeutic activities;Patient/family education;Moist Heat;Traction;Cryotherapy;Ultrasound;Functional mobility training;Manual techniques;Dry needling;Neuromuscular re-education;Passive range of motion;Joint Manipulations;Therapeutic exercise;DME Instruction    PT Home Exercise Plan  [email or web address]:    Consulted and Agree with Plan of Care  Patient       Patient will benefit from skilled therapeutic intervention in order to improve the following deficits and impairments:  Pain, Improper body mechanics, Increased fascial restricitons, Decreased mobility, Postural dysfunction, Decreased activity tolerance, Decreased endurance, Decreased range of motion, Decreased strength, Impaired UE functional use, Impaired flexibility  Visit Diagnosis: Chronic right shoulder pain  Stiffness of right shoulder, not elsewhere classified     Problem List Patient Active Problem List   Diagnosis Date Noted  . Post-operative state 05/22/2018  .  Postoperative state 05/22/2018  . Posttraumatic stress disorder 12/09/2016  . Leg edema, right 10/03/2014  . Genetic testing 07/10/2014  . Breast cancer of lower-inner quadrant of right female breast ([place]) 05/29/2014   Shelton Silvas PT, DPT Shelton Silvas 08/31/2019, 10:44 AM  [facility] [address] [place], [address] Phone: [phone]   Fax:  [phone]  Name: SHAWNIE LOBELL MRN: [id number] Date of Birth: June 27, 1970

## 2019-09-05 ENCOUNTER — Encounter: Payer: Medicare Other | Admitting: Podiatry

## 2019-09-05 ENCOUNTER — Encounter: Payer: Self-pay | Admitting: Physical Therapy

## 2019-09-05 ENCOUNTER — Ambulatory Visit: Payer: Medicare Other | Admitting: Physical Therapy

## 2019-09-05 ENCOUNTER — Other Ambulatory Visit: Payer: Self-pay

## 2019-09-05 DIAGNOSIS — M25511 Pain in right shoulder: Secondary | ICD-10-CM | POA: Diagnosis not present

## 2019-09-05 DIAGNOSIS — M25611 Stiffness of right shoulder, not elsewhere classified: Secondary | ICD-10-CM

## 2019-09-05 DIAGNOSIS — G8929 Other chronic pain: Secondary | ICD-10-CM

## 2019-09-05 NOTE — Therapy (Signed)
[facility] [address], [place], [address] Phone: [phone]   Fax:  [phone]  Physical Therapy Treatment  Patient Details  Name: Krystal Kelley MRN: [id number] Date of Birth: Jun 24, 1970 No data recorded  Encounter Date: 09/05/2019  PT End of Session - 09/05/19 0920    Visit Number  10    Number of Visits  13    Date for PT Re-Evaluation  09/07/19    PT Start Time  0912    PT Stop Time  0943    PT Time Calculation (min)  31 min    Activity Tolerance  Patient tolerated treatment well    Behavior During Therapy  [facility] for tasks assessed/performed       Past Medical History:  Diagnosis Date  . Anxiety   . Breast cancer, right breast ([place])   . Depression   . H/O laparoscopy   . History of hysteroscopy   . Peripheral edema    on lasix  . PONV (postoperative nausea and vomiting)   . Radiation 10/30/14-12/17/14   right breast 50.4 gray    Past Surgical History:  Procedure Laterality Date  . BREAST BIOPSY  05/16/14  . BREAST LUMPECTOMY Right 06/18/2014  . bunion surgrery    . CARPAL TUNNEL RELEASE Left 09/21/2016   Procedure: LEFT CARPAL TUNNEL RELEASE;  Surgeon: Daryll Brod, MD;  Location: [place];  Service: Orthopedics;  Laterality: Left;  REG/FAB  . carpel tunnel    . CHOLECYSTECTOMY  2015  . HYSTERECTOMY ABDOMINAL WITH SALPINGO-OOPHORECTOMY  05/22/2018   Procedure: HYSTERECTOMY ABDOMINAL WITH BILATERAL SALPINGO-OOPHORECTOMY;  Surgeon: [name], MD;  Location: [facility];  Service: Gynecology;;  . PLANTAR FASCIECTOMY    . PORTACATH PLACEMENT Left 06/18/2014   Procedure: INSERTION PORT-A-CATH;  Surgeon: Excell Seltzer, MD;  Location: [place];  Service: General;  Laterality: Left;  . ROBOTIC ASSISTED LAPAROSCOPIC LYSIS OF ADHESION  05/22/2018   Procedure: XI ROBOTIC ASSISTED LAPAROSCOPIC LYSIS OF ADHESION;  Surgeon: [name], MD;   Location: [facility];  Service: Gynecology;;  . ROBOTIC ASSISTED TOTAL HYSTERECTOMY WITH BILATERAL SALPINGO OOPHERECTOMY Bilateral 05/22/2018   Procedure: ATTEMPTED XI ROBOTIC ASSISTED TOTAL HYSTERECTOMY WITH BILATERAL SALPINGO OOPHORECTOMY;  Surgeon: [name], MD;  Location: [facility];  Service: Gynecology;  Laterality: Bilateral;    There were no vitals filed for this visit.  Subjective Assessment - 09/05/19 0914    Subjective  Paitent arrives late to session. Patient reports no pain this am, does report she is is a little distraught after helping a friend this am.    Pertinent History  Krystal Kelley is a 49 y.o. female with R shoulder pain/stiffness. History of right breast cancer treated with lumpectomy (April 2016), adjuvant chemotherapy with Herceptin, and radiation who is currently on anastrozole. She reports her bilat shoulder motion has been decreasing over the past few years, but the R shoulder motion is worse. Reports bilat shoulder pain that feels like it is in the joint, and is a dull ache. Best pain over the past week 0/10, worst 6/10. Pain brought on by reaching behind her back for toileting and bathing. Patient is on disability but enjoys yard work, but this is somewhat difficult d/t shoulder pain but she "pushes through it". Pt denies N/V, B&B changes, unexplained weight fluctuation, saddle paresthesia, fever, night sweats, or unrelenting night pain at this time.    Limitations  Lifting  How long can you sit comfortably?  unlimited    How long can you stand comfortably?  unlimited    How long can you walk comfortably?  unlimited    Diagnostic tests  not since 2016    Patient Stated Goals  Increase shoulder ROM and decrease pain    Pain Onset  More than a month ago       Ther-Ex UBE 65mins forward/15mins backward for increased endurance of UE use and warm up Standing bilat scaption with GTB 3x 10 with demo and cuing to maintain  horizontal abd throughout therex; good carry over Standing 90/90 ER RTB 3x 10 with cuing initially for coordination with good carry over Seated military press 4# DB bilat 3x 10 with cuing for technique with good carry over Overhead carry 5# DB x10 6# DB x10 38ft with min cuing for set up with good carry over following StandingHighRow2x 10 15# with min cuing for eccentric control                           PT Education - 09/05/19 0920    Education Details  therex form/technique    Person(s) Educated  Patient    Methods  Explanation;Demonstration;Verbal cues    Comprehension  Verbalized understanding;Returned demonstration;Verbal cues required       PT Short Term Goals - 07/27/19 1029      PT SHORT TERM GOAL #1   Title  Pt will be independent with HEP in order to improve strength and decrease pain in order to improve pain-free function at home and work.    Baseline  07/27/19 HEP given    Time  4    Period  Weeks    Status  New        PT Long Term Goals - 07/27/19 1030      PT LONG TERM GOAL #1   Title  Patient will increase FOTO score to 68 to demonstrate predicted increase in functional mobility to complete ADLs    Baseline  07/27/19 67    Time  6    Period  Weeks    Status  New      PT LONG TERM GOAL #2   Title  Patient will demonstrate at least 4+/5 bilat periscapular strength in order to complete heavy tasks in the yard    Baseline  07/27/19 R/L Latissimus 4/4 Y 3+/3+ T 4-/4-    Time  6    Period  Weeks    Status  New      PT LONG TERM GOAL #3   Title  Patient will report no pain with bathing and toileting in order to increase independence and QOL    Baseline  07/27/19 6/10 with toileting and washing her back in the shower    Time  [place] - 09/05/19 [id number]    Clinical Impression Statement  Pt arrived late to session, shortened accordingly. PT continued therex progression for increased periscapular  strength and stability with success. Pt is motivated throughout session, and is able to comply with all cuing for cooredination, and proper therex techniques with good carry over. No increased pain throughout session.    Personal Factors and Comorbidities  Fitness;Time since onset of injury/illness/exacerbation;Comorbidity 1;Past/Current Experience;Sex    Comorbidities  PMH R breast cancer    Examination-Activity Limitations  Sleep;Toileting;Reach Overhead;Hygiene/Grooming;Bathing    Examination-Participation Restrictions  Laundry;Cleaning;Yard Work;Meal Prep;Community Activity    Stability/Clinical Decision Making  Evolving/Moderate complexity    Clinical Decision Making  Moderate    Rehab Potential  Good    PT Frequency  2x / week    PT Duration  8 weeks    PT Treatment/Interventions  ADLs/Self Care Home Management;Electrical Stimulation;Therapeutic activities;Patient/family education;Moist Heat;Traction;Cryotherapy;Ultrasound;Functional mobility training;Manual techniques;Dry needling;Neuromuscular re-education;Passive range of motion;Joint Manipulations;Therapeutic exercise;DME Instruction    PT Home Exercise Plan  [email or web address]:    Consulted and Agree with Plan of Care  Patient       Patient will benefit from skilled therapeutic intervention in order to improve the following deficits and impairments:  Pain, Improper body mechanics, Increased fascial restricitons, Decreased mobility, Postural dysfunction, Decreased activity tolerance, Decreased endurance, Decreased range of motion, Decreased strength, Impaired UE functional use, Impaired flexibility  Visit Diagnosis: Chronic right shoulder pain  Stiffness of right shoulder, not elsewhere classified     Problem List Patient Active Problem List   Diagnosis Date Noted  . Post-operative state 05/22/2018  . Postoperative state 05/22/2018  . Posttraumatic stress disorder 12/09/2016  . Leg edema, right 10/03/2014  . Genetic testing  07/10/2014  . Breast cancer of lower-inner quadrant of right female breast ([place]) 05/29/2014   Shelton Silvas PT, DPT Shelton Silvas 09/05/2019, 9:43 AM  [place] [facility] [address] [place], [address] Phone: [phone]   Fax:  [phone]  Name: ACASIA HELM MRN: [id number] Date of Birth: 08-Mar-1971

## 2019-09-07 ENCOUNTER — Other Ambulatory Visit: Payer: Self-pay

## 2019-09-07 ENCOUNTER — Encounter: Payer: Self-pay | Admitting: Physical Therapy

## 2019-09-07 ENCOUNTER — Ambulatory Visit: Payer: Medicare Other | Admitting: Physical Therapy

## 2019-09-07 DIAGNOSIS — G8929 Other chronic pain: Secondary | ICD-10-CM

## 2019-09-07 DIAGNOSIS — M25511 Pain in right shoulder: Secondary | ICD-10-CM | POA: Diagnosis not present

## 2019-09-07 DIAGNOSIS — M25611 Stiffness of right shoulder, not elsewhere classified: Secondary | ICD-10-CM

## 2019-09-07 NOTE — Therapy (Signed)
[facility] [address], [place], [address] Phone: [phone]   Fax:  [phone]  Physical Therapy Treatment/Discharge Summary  Reporting Period 07/27/19 - 09/07/19  Patient Details  Name: Krystal Kelley MRN: [id number] Date of Birth: May 24, 1970 No data recorded  Encounter Date: 09/07/2019  PT End of Session - 09/07/19 1018    Visit Number  11    Number of Visits  13    Date for PT Re-Evaluation  09/07/19    PT Start Time  1011    PT Stop Time  1040    PT Time Calculation (min)  29 min    Activity Tolerance  Patient tolerated treatment well    Behavior During Therapy  [facility] for tasks assessed/performed       Past Medical History:  Diagnosis Date  . Anxiety   . Breast cancer, right breast ([place])   . Depression   . H/O laparoscopy   . History of hysteroscopy   . Peripheral edema    on lasix  . PONV (postoperative nausea and vomiting)   . Radiation 10/30/14-12/17/14   right breast 50.4 gray    Past Surgical History:  Procedure Laterality Date  . BREAST BIOPSY  05/16/14  . BREAST LUMPECTOMY Right 06/18/2014  . bunion surgrery    . CARPAL TUNNEL RELEASE Left 09/21/2016   Procedure: LEFT CARPAL TUNNEL RELEASE;  Surgeon: Daryll Brod, MD;  Location: [facility];  Service: Orthopedics;  Laterality: Left;  REG/FAB  . carpel tunnel    . CHOLECYSTECTOMY  2015  . HYSTERECTOMY ABDOMINAL WITH SALPINGO-OOPHORECTOMY  05/22/2018   Procedure: HYSTERECTOMY ABDOMINAL WITH BILATERAL SALPINGO-OOPHORECTOMY;  Surgeon: [name], MD;  Location: [facility];  Service: Gynecology;;  . PLANTAR FASCIECTOMY    . PORTACATH PLACEMENT Left 06/18/2014   Procedure: INSERTION PORT-A-CATH;  Surgeon: Excell Seltzer, MD;  Location: [facility];  Service: General;  Laterality: Left;  . ROBOTIC ASSISTED LAPAROSCOPIC LYSIS OF ADHESION  05/22/2018   Procedure: XI ROBOTIC ASSISTED LAPAROSCOPIC  LYSIS OF ADHESION;  Surgeon: [name], MD;  Location: [facility];  Service: Gynecology;;  . ROBOTIC ASSISTED TOTAL HYSTERECTOMY WITH BILATERAL SALPINGO OOPHERECTOMY Bilateral 05/22/2018   Procedure: ATTEMPTED XI ROBOTIC ASSISTED TOTAL HYSTERECTOMY WITH BILATERAL SALPINGO OOPHORECTOMY;  Surgeon: [name], MD;  Location: [facility];  Service: Gynecology;  Laterality: Bilateral;    There were no vitals filed for this visit.  Subjective Assessment - 09/07/19 1011    Subjective  Pt arrives late to session. Reports she is not having any pain with ADLs, and has good compliance with HEP    Pertinent History  Krystal Kelley is a 49 y.o. female with R shoulder pain/stiffness. History of right breast cancer treated with lumpectomy (April 2016), adjuvant chemotherapy with Herceptin, and radiation who is currently on anastrozole. She reports her bilat shoulder motion has been decreasing over the past few years, but the R shoulder motion is worse. Reports bilat shoulder pain that feels like it is in the joint, and is a dull ache. Best pain over the past week 0/10, worst 6/10. Pain brought on by reaching behind her back for toileting and bathing. Patient is on disability but enjoys yard work, but this is somewhat difficult d/t shoulder pain but she "pushes through it". Pt denies N/V, B&B changes, unexplained weight fluctuation, saddle paresthesia, fever, night sweats, or unrelenting night pain at this time.    Limitations  Lifting    How long can you sit comfortably?  unlimited    How long can you stand comfortably?  unlimited    How long can you walk comfortably?  unlimited    Diagnostic tests  not since 2016    Patient Stated Goals  Increase shoulder ROM and decrease pain    Pain Onset  More than a month ago         Ther-Ex UBE 73mns forward/253ms backward for increased endurance of UE use and warm up PT led patient through the following therex to  maintain strength and mobility with patient able to demonstrate each of the following with min cuing needed for minor corrections. Education on how/when to increase/decrease intensity, frequency, rep/set range for strengthening gains, etc. With patient verbalizing understanding of this Access Code: [email or web address] Scapular Retraction Y - 1 x daily - 2-3 x weekly - 3 sets - 10 reps .Prone Scapular Retraction Arms at Side - 1 x daily - 2-3 x weekly - 3 sets - 10 reps .Standing High Row with Resistance - 1 x daily - 2-3 x weekly - 3 sets - 10 reps .Standing Shoulder Horizontal Abduction with Resistance - 1 x daily - 2-3 x weekly - 3 sets - 10 reps .Standing Shoulder External Rotation with Resistance - 1 x daily - 2-3 x weekly - 3 sets - 5-10 reps     ROM and MMT assessment FOTO                     PT Education - 09/07/19 1018    Education Details  HEP update, d/c recommendations    Person(s) Educated  Patient    Methods  Explanation;Demonstration;Verbal cues;Handout    Comprehension  Verbalized understanding;Returned demonstration;Verbal cues required       PT Short Term Goals - 09/07/19 1013      PT SHORT TERM GOAL #1   Title  Pt will be independent with HEP in order to improve strength and decrease pain in order to improve pain-free function at home and work.    Baseline  09/07/19 HEP completed regularly 07/27/19 HEP given    Time  4    Period  Weeks    Status  Achieved        PT Long Term Goals - 09/07/19 1014      PT LONG TERM GOAL #1   Title  Patient will increase FOTO score to 68 to demonstrate predicted increase in functional mobility to complete ADLs    Baseline  09/07/19 72 07/27/19 67    Time  6    Period  Weeks      PT LONG TERM GOAL #2   Title  Patient will demonstrate at least 4+/5 bilat periscapular strength in order to complete heavy tasks in the yard    Baseline  5/21//21 R/L Latissimus 5/5  Y 4+/4+ T 4+/4+ 07/27/19 R/L Latissimus 4/4 Y 3+/3+ T  4-/4-    Period  Weeks    Status  Achieved      PT LONG TERM GOAL #3   Title  Patient will report no pain with bathing and toileting in order to increase independence and QOL    Baseline  09/07/19 No pain overall 07/27/19 6/10 with toileting and washing her back in the shower    Time  6    Period  Weeks    Status  Achieved            Plan -  09/07/19 1038    Clinical Impression Statement  Pt arrived late to session, session shortened accordingly. PT re-assessed goals this session, where patient has met all goals to d/c PT to HEP. PT led patient through HEP for maintenance of gains made through PT, with paitent able to demonstrate and verablize understanding or HEP and d/c recommendations. Pt given clinic contact info should any further questions or concerns arise.    Personal Factors and Comorbidities  Fitness;Time since onset of injury/illness/exacerbation;Comorbidity 1;Past/Current Experience;Sex    Comorbidities  PMH R breast cancer    Examination-Activity Limitations  Sleep;Toileting;Reach Overhead;Hygiene/Grooming;Bathing    Examination-Participation Restrictions  Laundry;Cleaning;Yard Work;Meal Prep;Community Activity    Stability/Clinical Decision Making  Evolving/Moderate complexity    Clinical Decision Making  Moderate    Rehab Potential  Good    PT Frequency  2x / week    PT Duration  8 weeks    PT Treatment/Interventions  ADLs/Self Care Home Management;Electrical Stimulation;Therapeutic activities;Patient/family education;Moist Heat;Traction;Cryotherapy;Ultrasound;Functional mobility training;Manual techniques;Dry needling;Neuromuscular re-education;Passive range of motion;Joint Manipulations;Therapeutic exercise;DME Instruction    PT Home Exercise Plan  [email or web address]:    Consulted and Agree with Plan of Care  Patient       Patient will benefit from skilled therapeutic intervention in order to improve the following deficits and impairments:  Pain, Improper body mechanics,  Increased fascial restricitons, Decreased mobility, Postural dysfunction, Decreased activity tolerance, Decreased endurance, Decreased range of motion, Decreased strength, Impaired UE functional use, Impaired flexibility  Visit Diagnosis: Chronic right shoulder pain  Stiffness of right shoulder, not elsewhere classified     Problem List Patient Active Problem List   Diagnosis Date Noted  . Post-operative state 05/22/2018  . Postoperative state 05/22/2018  . Posttraumatic stress disorder 12/09/2016  . Leg edema, right 10/03/2014  . Genetic testing 07/10/2014  . Breast cancer of lower-inner quadrant of right female breast ([place]) 05/29/2014   Shelton Silvas PT, DPT Shelton Silvas 09/07/2019, 10:40 AM  [facility] [name], [place], [address] Phone: [phone]   Fax:  [phone]  Name: Krystal Kelley MRN: [id number] Date of Birth: Oct 09, 1970

## 2019-09-12 ENCOUNTER — Ambulatory Visit: Payer: Medicare Other | Admitting: Physical Therapy

## 2019-09-14 ENCOUNTER — Ambulatory Visit: Payer: Medicare Other | Admitting: Physical Therapy

## 2019-09-24 ENCOUNTER — Other Ambulatory Visit: Payer: Self-pay

## 2019-09-24 ENCOUNTER — Encounter: Payer: Medicare Other | Admitting: Podiatry

## 2019-09-24 ENCOUNTER — Encounter: Payer: Self-pay | Admitting: Podiatry

## 2019-09-24 ENCOUNTER — Ambulatory Visit (INDEPENDENT_AMBULATORY_CARE_PROVIDER_SITE_OTHER): Payer: Medicare Other | Admitting: Podiatry

## 2019-09-24 DIAGNOSIS — M778 Other enthesopathies, not elsewhere classified: Secondary | ICD-10-CM

## 2019-09-24 DIAGNOSIS — M722 Plantar fascial fibromatosis: Secondary | ICD-10-CM | POA: Diagnosis not present

## 2019-09-24 NOTE — Progress Notes (Signed)
She presents today states of still having pain is about 60% improved on the right side is she points to the tissues just distal to the sinus tarsi.  She states that her left heel has been bothering her as well.  Objective: Vital signs are stable she is alert oriented x3.  Pulses are palpable.  She has mild tenderness on palpation of the fourth fifth tarsometatarsal joint right foot and plantar fascia of the left foot.  Assessment: Plantar fasciitis left chronic capsulitis fourth fifth tarsometatarsal joint right.  Plan: I injected the plantar heel left.  Also injected another 10 mg to the point of maximal tenderness dorsal lateral aspect of the right foot.  Follow-up with her in a month if necessary.  Encouraged her to start using her orthotics again.

## 2019-10-24 ENCOUNTER — Other Ambulatory Visit: Payer: Self-pay

## 2019-10-24 ENCOUNTER — Ambulatory Visit (INDEPENDENT_AMBULATORY_CARE_PROVIDER_SITE_OTHER): Payer: Medicare Other | Admitting: Podiatry

## 2019-10-24 ENCOUNTER — Encounter: Payer: Self-pay | Admitting: Podiatry

## 2019-10-24 DIAGNOSIS — M722 Plantar fascial fibromatosis: Secondary | ICD-10-CM | POA: Diagnosis not present

## 2019-10-24 NOTE — Progress Notes (Signed)
She presents today chief complaint of plantar fasciitis bilaterally.  States that they are doing pretty good today.  Objective: Vital signs are stable she is alert and oriented x3.  Pulses are palpable.  She has mild to moderate tenderness on palpation of the medial aspect of the plantar fascia at the insertion site on the calcaneus.  Assessment: Chronic intractable plantar fasciitis bilateral.  Plan: Injected 10 mg of Kenalog 5 mg Marcaine point maximal tenderness of the bilateral heels.  Tolerated procedure well will follow up with her in about 6 weeks if necessary.

## 2019-11-28 ENCOUNTER — Other Ambulatory Visit: Payer: Self-pay

## 2019-11-28 ENCOUNTER — Encounter: Payer: Self-pay | Admitting: Podiatry

## 2019-11-28 ENCOUNTER — Ambulatory Visit (INDEPENDENT_AMBULATORY_CARE_PROVIDER_SITE_OTHER): Payer: Medicare Other | Admitting: Podiatry

## 2019-11-28 DIAGNOSIS — M722 Plantar fascial fibromatosis: Secondary | ICD-10-CM | POA: Diagnosis not present

## 2019-11-28 NOTE — Progress Notes (Signed)
She presents today chief complaint of bilateral pain in her feet.  Dorsal lateral aspect right foot and plantar fascial area left foot.  Objective: Vital signs are stable alert oriented x3.  She has pain on palpation fourth fifth met cuboid articulation right foot.  Left heel is painful with plantar fasciitis.  Assessment: Plantar fasciitis left capsulitis right.  Plan: Discussed etiology pathology conservative therapies this point time went ahead and performed injection 10 mg to the point of maximal tenderness fourth fifth TMT joint.  Also 61m of 10 point maximal tenderness plantar fascia left.  I will follow back with her in 6 weeks

## 2019-12-31 ENCOUNTER — Encounter: Payer: Self-pay | Admitting: Podiatry

## 2019-12-31 ENCOUNTER — Other Ambulatory Visit: Payer: Self-pay

## 2019-12-31 ENCOUNTER — Ambulatory Visit (INDEPENDENT_AMBULATORY_CARE_PROVIDER_SITE_OTHER): Payer: Medicare Other | Admitting: Podiatry

## 2019-12-31 DIAGNOSIS — M722 Plantar fascial fibromatosis: Secondary | ICD-10-CM

## 2019-12-31 DIAGNOSIS — M778 Other enthesopathies, not elsewhere classified: Secondary | ICD-10-CM

## 2019-12-31 NOTE — Progress Notes (Signed)
She presents today states that they are doing pretty well states that her knees are starting to bother her more than anything else at this point.  States that the left heel is still somewhat tender.  Objective: Vital signs are stable alert oriented x3 there is no erythema edema cellulitis drainage odor she has pain on palpation medial hip tubercle of the left heel.  Assessment: Plantar fasciitis left.  Plan: Discussed etiology pathology conservative versus surgical therapies.  Injected her left heel today 20 mg Kenalog 5 mg Marcaine point maximal tenderness.  Tolerated seizure well follow-up with me 6 weeks if necessary.

## 2020-02-06 ENCOUNTER — Encounter: Payer: Self-pay | Admitting: Internal Medicine

## 2020-02-11 ENCOUNTER — Encounter: Payer: Medicare Other | Admitting: Podiatry

## 2020-02-26 ENCOUNTER — Encounter: Payer: Self-pay | Admitting: Obstetrics & Gynecology

## 2020-02-27 ENCOUNTER — Encounter: Payer: Medicare Other | Admitting: Podiatry

## 2020-02-28 ENCOUNTER — Telehealth (INDEPENDENT_AMBULATORY_CARE_PROVIDER_SITE_OTHER): Payer: Self-pay | Admitting: Gastroenterology

## 2020-02-28 ENCOUNTER — Other Ambulatory Visit: Payer: Self-pay

## 2020-02-28 DIAGNOSIS — Z1211 Encounter for screening for malignant neoplasm of colon: Secondary | ICD-10-CM

## 2020-02-28 MED ORDER — NA SULFATE-K SULFATE-MG SULF 17.5-3.13-1.6 GM/177ML PO SOLN: 1.0000 | Freq: Once | ORAL | 0 refills | Status: AC

## 2020-02-28 NOTE — Progress Notes (Signed)
Gastroenterology Pre-Procedure Review  Request Date: Tuesday 11/30 Requesting Physician: Dr. Allen Norris  PATIENT REVIEW QUESTIONS: The patient responded to the following health history questions as indicated:    1. Are you having any GI issues? yes (Diarrhea since gallbladder surgery 2013.  Everything goes through her after she eats.  Discussed with Dr. Allen Norris in the past.  Was taking Vyberzi... takes sometimes. ) 2. Do you have a personal history of Polyps? no 3. Do you have a family history of Colon Cancer or Polyps? no 4. Diabetes Mellitus? no 5. Joint replacements in the past 12 months?no 6. Major health problems in the past 3 months?no 7. Any artificial heart valves, MVP, or defibrillator?no    MEDICATIONS & ALLERGIES:    Patient reports the following regarding taking any anticoagulation/antiplatelet therapy:   Plavix, Coumadin, Eliquis, Xarelto, Lovenox, Pradaxa, Brilinta, or Effient? no Aspirin? no  Patient confirms/reports the following medications:  Current Outpatient Medications  Medication Sig Dispense Refill  . cycloSPORINE (RESTASIS) 0.05 % ophthalmic emulsion Place 1 drop into both eyes 2 (two) times daily.    . Eluxadoline (VIBERZI) 100 MG TABS Take 1 tablet (100 mg total) by mouth 2 (two) times daily. 60 tablet 5  . EUCRISA 2 % OINT     . Flaxseed, Linseed, (FLAXSEED OIL) 1000 MG CAPS Take 1,000 mg by mouth daily.    . fluticasone (FLONASE) 50 MCG/ACT nasal spray Place 1 spray into both nostrils daily as needed for allergies.    . furosemide (LASIX) 20 MG tablet TAKE 3 TABLETS(60 MG) BY MOUTH DAILY (Patient taking differently: Take 20 mg by mouth 3 (three) times daily. ) 90 tablet 0  . phentermine 37.5 MG capsule Take 1 capsule (37.5 mg total) by mouth every morning. 30 capsule 2  . acetaminophen (TYLENOL) 500 MG tablet Take 500-1,000 mg by mouth every 6 (six) hours as needed (pain.). (Patient not taking: Reported on 02/28/2020)    . anastrozole (ARIMIDEX) 1 MG tablet Take 1  tablet (1 mg total) by mouth daily. (Patient not taking: Reported on 02/28/2020) 90 tablet 3  . Coenzyme Q10 (COQ10) 100 MG CAPS Take 100 mg by mouth daily. (Patient not taking: Reported on 02/28/2020)    . guaiFENesin (MUCINEX) 600 MG 12 hr tablet Take 600 mg by mouth 2 (two) times daily as needed (cold symptoms.). (Patient not taking: Reported on 02/28/2020)    . hydrocortisone 2.5 % cream Apply 1 application topically 2 (two) times daily as needed (irritated/itchy skin.). (Patient not taking: Reported on 02/28/2020)    . HYDROMET 5-1.5 MG/5ML syrup Take 2.5 mLs by mouth every 6 (six) hours as needed. (Patient not taking: Reported on 02/28/2020)    . ibuprofen (ADVIL,MOTRIN) 200 MG tablet Take 400 mg by mouth every 8 (eight) hours as needed (pain.). (Patient not taking: Reported on 02/28/2020)    . ketoconazole (NIZORAL) 2 % cream Apply 1 application topically daily as needed for irritation (irritated/itchy skin.). (Patient not taking: Reported on 02/28/2020)    . meloxicam (MOBIC) 15 MG tablet Take 15 mg by mouth daily.    . mometasone (ELOCON) 0.1 % cream  (Patient not taking: Reported on 02/28/2020)    . Multiple Vitamin (MULTIVITAMIN WITH MINERALS) TABS tablet Take 1 tablet by mouth daily. One-A-Day Multivitamin (Patient not taking: Reported on 02/28/2020)    . oxyCODONE-acetaminophen (PERCOCET) 7.5-325 MG tablet Take 1 tablet by mouth every 6 (six) hours as needed for severe pain. (Patient not taking: Reported on 02/28/2020) 30 tablet 0  .  Pseudoephedrine-Ibuprofen (ADVIL COLD/SINUS PO) Take 1 tablet by mouth 4 (four) times daily as needed (cold symptoms.). (Patient not taking: Reported on 02/28/2020)    . valACYclovir (VALTREX) 500 MG tablet Take 500 mg by mouth 2 (two) times daily as needed (outbreaks.). (Patient not taking: Reported on 02/28/2020)    . vitamin B-12 (CYANOCOBALAMIN) 500 MCG tablet Take 500 mcg by mouth daily. (Patient not taking: Reported on 02/28/2020)    . vitamin C  (ASCORBIC ACID) 500 MG tablet Take 500 mg by mouth daily. (Patient not taking: Reported on 02/28/2020)     No current facility-administered medications for this visit.    Patient confirms/reports the following allergies:  No Known Allergies  No orders of the defined types were placed in this encounter.   AUTHORIZATION INFORMATION Primary Insurance: 1D#: Group #:  Secondary Insurance: 1D#: Group #:  SCHEDULE INFORMATION: Date: 11/30 Time: Location:[facility]

## 2020-02-29 ENCOUNTER — Encounter: Payer: Medicare Other | Admitting: Obstetrics & Gynecology

## 2020-02-29 ENCOUNTER — Other Ambulatory Visit: Payer: Medicare Other

## 2020-02-29 ENCOUNTER — Other Ambulatory Visit: Payer: Self-pay

## 2020-02-29 ENCOUNTER — Other Ambulatory Visit: Payer: Self-pay | Admitting: Anesthesiology

## 2020-02-29 DIAGNOSIS — Z01419 Encounter for gynecological examination (general) (routine) without abnormal findings: Secondary | ICD-10-CM

## 2020-03-03 ENCOUNTER — Other Ambulatory Visit: Payer: Self-pay

## 2020-03-03 DIAGNOSIS — D72828 Other elevated white blood cell count: Secondary | ICD-10-CM

## 2020-03-04 ENCOUNTER — Other Ambulatory Visit: Payer: Self-pay

## 2020-03-04 ENCOUNTER — Encounter: Payer: Self-pay | Admitting: Obstetrics & Gynecology

## 2020-03-04 ENCOUNTER — Ambulatory Visit (INDEPENDENT_AMBULATORY_CARE_PROVIDER_SITE_OTHER): Payer: Medicare Other | Admitting: Obstetrics & Gynecology

## 2020-03-04 VITALS — BP 132/84 | Ht 63.5 in | Wt 285.0 lb

## 2020-03-04 DIAGNOSIS — Z01419 Encounter for gynecological examination (general) (routine) without abnormal findings: Secondary | ICD-10-CM

## 2020-03-04 DIAGNOSIS — Z90722 Acquired absence of ovaries, bilateral: Secondary | ICD-10-CM

## 2020-03-04 DIAGNOSIS — R350 Frequency of micturition: Secondary | ICD-10-CM

## 2020-03-04 DIAGNOSIS — Z6841 Body Mass Index (BMI) 40.0 and over, adult: Secondary | ICD-10-CM

## 2020-03-04 DIAGNOSIS — Z23 Encounter for immunization: Secondary | ICD-10-CM

## 2020-03-04 DIAGNOSIS — Z170 Estrogen receptor positive status [ER+]: Secondary | ICD-10-CM

## 2020-03-04 DIAGNOSIS — E66813 Obesity, class 3: Secondary | ICD-10-CM

## 2020-03-04 DIAGNOSIS — Z90710 Acquired absence of both cervix and uterus: Secondary | ICD-10-CM

## 2020-03-04 DIAGNOSIS — Z9079 Acquired absence of other genital organ(s): Secondary | ICD-10-CM | POA: Diagnosis not present

## 2020-03-04 DIAGNOSIS — C50311 Malignant neoplasm of lower-inner quadrant of right female breast: Secondary | ICD-10-CM

## 2020-03-04 DIAGNOSIS — R7309 Other abnormal glucose: Secondary | ICD-10-CM

## 2020-03-04 LAB — CBC WITH DIFFERENTIAL/PLATELET
Absolute Monocytes: 851 cells/uL (ref 200–950)
Basophils Absolute: 26 cells/uL (ref 0–200)
Basophils Relative: 0.2 %
Eosinophils Absolute: 0 cells/uL — ABNORMAL LOW (ref 15–500)
Eosinophils Relative: 0 %
HCT: 40.7 % (ref 35.0–45.0)
Hemoglobin: 13.7 g/dL (ref 11.7–15.5)
Lymphs Abs: 2206 cells/uL (ref 850–3900)
MCH: 31.9 pg (ref 27.0–33.0)
MCHC: 33.7 g/dL (ref 32.0–36.0)
MCV: 94.7 fL (ref 80.0–100.0)
MPV: 10.1 fL (ref 7.5–12.5)
Monocytes Relative: 6.6 %
Neutro Abs: 9817 cells/uL — ABNORMAL HIGH (ref 1500–7800)
Neutrophils Relative %: 76.1 %
Platelets: 373 10*3/uL (ref 140–400)
RBC: 4.3 10*6/uL (ref 3.80–5.10)
RDW: 13.1 % (ref 11.0–15.0)
Total Lymphocyte: 17.1 %
WBC: 12.9 10*3/uL — ABNORMAL HIGH (ref 3.8–10.8)

## 2020-03-04 LAB — LIPID PANEL
Cholesterol: 222 mg/dL — ABNORMAL HIGH (ref <200)
HDL: 84 mg/dL (ref >=50)
LDL Cholesterol (Calc): 122 mg/dL (calc) — ABNORMAL HIGH
Non-HDL Cholesterol (Calc): 138 mg/dL (calc) — ABNORMAL HIGH (ref <130)
Total CHOL/HDL Ratio: 2.6 (calc) (ref <5.0)
Triglycerides: 69 mg/dL (ref <150)

## 2020-03-04 LAB — COMPREHENSIVE METABOLIC PANEL
AG Ratio: 1.2 (calc) (ref 1.0–2.5)
ALT: 30 U/L — ABNORMAL HIGH (ref 6–29)
AST: 20 U/L (ref 10–35)
Albumin: 4.1 g/dL (ref 3.6–5.1)
Alkaline phosphatase (APISO): 75 U/L (ref 37–153)
BUN: 14 mg/dL (ref 7–25)
CO2: 26 mmol/L (ref 20–32)
Calcium: 10 mg/dL (ref 8.6–10.4)
Chloride: 103 mmol/L (ref 98–110)
Creat: 0.84 mg/dL (ref 0.50–1.05)
Globulin: 3.3 g/dL (calc) (ref 1.9–3.7)
Glucose, Bld: 115 mg/dL — ABNORMAL HIGH (ref 65–99)
Potassium: 4.2 mmol/L (ref 3.5–5.3)
Sodium: 139 mmol/L (ref 135–146)
Total Bilirubin: 0.4 mg/dL (ref 0.2–1.2)
Total Protein: 7.4 g/dL (ref 6.1–8.1)

## 2020-03-04 LAB — VITAMIN D 1,25 DIHYDROXY
Vitamin D 1, 25 (OH)2 Total: 49 pg/mL (ref 18–72)
Vitamin D2 1, 25 (OH)2: <8 pg/mL
Vitamin D3 1, 25 (OH)2: 49 pg/mL

## 2020-03-04 LAB — HEMOGLOBIN A1C W/OUT EAG: Hgb A1c MFr Bld: 5.3 % of total Hgb (ref <5.7)

## 2020-03-04 LAB — TSH: TSH: 0.67 mIU/L

## 2020-03-04 MED ORDER — SULFAMETHOXAZOLE-TRIMETHOPRIM 800-160 MG PO TABS: 1.0000 | ORAL_TABLET | Freq: Two times a day (BID) | ORAL | 0 refills | Status: AC

## 2020-03-04 NOTE — Progress Notes (Signed)
Krystal Kelley 23-Jul-1970 [id number]   History:    49 y.o.  G0 Single  RP: Established patient presenting for annual gyn exam   HPI: S/P TAH/BSO in 05/2018.  No pelvic pain.  Abstinent x last Pap test 12/2018 which was negative/HPV HR neg.  H/O Rt Breast Cancer on Anastrazole.  Breasts normal. Screening mammo 09/2019 Benign.  Urinary frequency x 3 days.  BMs normal.  BMI 49.69.  Gained weight x last year.  Referred to Nutritionist by Fam MD, appointment tomorrow.  Health labs done 02/29/20, Glucose 115, will do HBA1C today.    Past medical history,surgical history, family history and social history were all reviewed and documented in the EPIC chart.  Gynecologic History No LMP recorded (lmp unknown). Patient has had a hysterectomy.  Obstetric History OB History  Gravida Para Term Preterm AB Living  0 0 0 0 0 0  SAB TAB Ectopic Multiple Live Births  0 0 0 0 0     ROS: A ROS was performed and pertinent positives and negatives are included in the history.  GENERAL: No fevers or chills. HEENT: No change in vision, no earache, sore throat or sinus congestion. NECK: No pain or stiffness. CARDIOVASCULAR: No chest pain or pressure. No palpitations. PULMONARY: No shortness of breath, cough or wheeze. GASTROINTESTINAL: No abdominal pain, nausea, vomiting or diarrhea, melena or bright red blood per rectum. GENITOURINARY: No urinary frequency, urgency, hesitancy or dysuria. MUSCULOSKELETAL: No joint or muscle pain, no back pain, no recent trauma. DERMATOLOGIC: No rash, no itching, no lesions. ENDOCRINE: No polyuria, polydipsia, no heat or cold intolerance. No recent change in weight. HEMATOLOGICAL: No anemia or easy bruising or bleeding. NEUROLOGIC: No headache, seizures, numbness, tingling or weakness. PSYCHIATRIC: No depression, no loss of interest in normal activity or change in sleep pattern.     Exam:   BP 132/84   Ht 5' 3.5" (1.613 m)   Wt 285 lb (129.3 kg)   LMP  (LMP Unknown)   BMI  49.69 kg/m   Body mass index is 49.69 kg/m.  General appearance : Well developed well nourished female. No acute distress HEENT: Eyes: no retinal hemorrhage or exudates,  Neck supple, trachea midline, no carotid bruits, no thyroidmegaly Lungs: Clear to auscultation, no rhonchi or wheezes, or rib retractions  Heart: Regular rate and rhythm, no murmurs or gallops Breast:Examined in sitting and supine position were symmetrical in appearance, no palpable masses or tenderness,  no skin retraction, no nipple inversion, no nipple discharge, no skin discoloration, no axillary or supraclavicular lymphadenopathy Abdomen: no palpable masses or tenderness, no rebound or guarding Extremities: no edema or skin discoloration or tenderness  Pelvic: Vulva: Normal             Vagina: No gross lesions or discharge  Cervix/Uterus absent  Adnexa  Without masses or tenderness  Anus: Normal  U/A: Yellow cloudy, nitrites negative, proteins negative, white blood cells 6-10, red blood cells negative, bacteria few.   Assessment/Plan:  49 y.o. female for annual exam   1. Encounter for gynecological examination Gynecologic exam status post TAH/BSO.  No indication for a Pap test this year.  Breast exam normal.  Screening mammo 09/2019 Benign.  Needs to schedule a [place].  Health labs done here 02/29/2020, Glucose 115, will do HBA1C today.  Low sugar diet.  CBC with WBCs 12.9, will repeat in 2 weeks.  2. S/P TAH-BSO  3. Frequency of urination Probable acute cystitis per clinical Sxs and U/A.  Will treat with Bactrim DS.  Usage reviewed and prescription sent to pharmacy.  Urine culture pending. - Urinalysis,Complete w/RFL Culture  4. High glucose level Low sugar diet recommended. - Hemoglobin A1C  5. Malignant neoplasm of lower-inner quadrant of right breast of female, estrogen receptor positive ([place]) Rt breast Ca on Anastrazole.  6. Class 3 severe obesity due to excess calories without serious comorbidity  with body mass index (BMI) of 45.0 to 49.9 in adult [facility]) Appointment with nutritionist at [facility].  Low calorie/carb diet with portion control discussed.  Aerobic activities 5 times a week and light weight lifting every 2 days.  Other orders - sulfamethoxazole-trimethoprim (BACTRIM DS) 800-160 MG tablet; Take 1 tablet by mouth 2 (two) times daily for 3 days.  [name] MD, 8:59 AM 03/04/2020

## 2020-03-05 ENCOUNTER — Encounter: Payer: Medicare Other | Attending: Internal Medicine | Admitting: Dietician

## 2020-03-05 VITALS — Ht 65.0 in | Wt 285.1 lb

## 2020-03-05 DIAGNOSIS — Z713 Dietary counseling and surveillance: Secondary | ICD-10-CM | POA: Diagnosis present

## 2020-03-05 DIAGNOSIS — I10 Essential (primary) hypertension: Secondary | ICD-10-CM | POA: Diagnosis not present

## 2020-03-05 DIAGNOSIS — E7800 Pure hypercholesterolemia, unspecified: Secondary | ICD-10-CM | POA: Insufficient documentation

## 2020-03-05 DIAGNOSIS — E66813 Obesity, class 3: Secondary | ICD-10-CM

## 2020-03-05 LAB — HEMOGLOBIN A1C
Hgb A1c MFr Bld: 5.3 % of total Hgb (ref <5.7)
Mean Plasma Glucose: 105 (calc)
eAG (mmol/L): 5.8 (calc)

## 2020-03-05 NOTE — Patient Instructions (Addendum)
Long term goals: Switch back to daytime wakefulness and eating, and nighttime sleeping    Increase energy    Lose weight  Short term goals:  Work on increasing fluids (all fluids) to at least 64oz daily.   Find ways to improve nighttime sleep such as going to bed a few minutes earlier every few days. Implement a relaxing routine for about 1 hour before going to bed. If unable to sleep after 20 minutes, get back up and do something relaxing until very sleepy.   Limit daytime sleep/ rest -- set alarm to get up earlier each day if needed.   Discuss possibility of restarting some weight loss medication with your physician  Eat a balance of food groups with meals. Plan to eat something every 3-5 hours during the time you are awake.   Pre-portion foods like ice cream, crackers, nuts. Put the rest away.

## 2020-03-05 NOTE — Progress Notes (Signed)
Medical Nutrition Therapy: Visit start time: 1100  end time: 1200  Assessment:  Diagnosis: obesity Past medical history: history of breast cancer, elevated BP Psychosocial issues/ stress concerns: none  Preferred learning method:   Auditory  Visual  Hands-on   Current weight: 285.1lbs  Height: 5'5" Medications, supplements: reconciled list in medical record  Progress and evaluation:   Has tried intermittent fasting and low carb diet plans, but did not lose any weight. Counted carbs, kept each food to <5g carb.   Was able to lose weight in the past with weight loss medication phentermine, then Belviq (until withdrawn from market), along with MNT.    Over the past several years, she has experienced weight gain due to hormonal changes after chemo led to menopause, then had hysterectomy. She reports gain of about 40lbs over the past 1-2 years.  Has struggled with insomnia due to med side effects, often not sleeping until 4am-- gets in bed about 1am. Up at 7:30am, rests/sleeps 9a-2p  Has kids at home with after school activities, also cares for niece and nephew after school  Feels problem foods are ice cream and fruit juices  Physical activity: limited due to joint pain; has done some water exercise,  leisure walking; has done some 30 min circuit training in the past, thinking of resuming  Dietary Intake:  Usual eating pattern includes 1-2 meals and 1-2 snacks per day. Dining out frequency: 2-3 meals per week.  Breakfast: sometimes [place] bacon occ regular bacon + eggs with cheese, occ with toast; protein shake; cereal; or skips Snack: none, usually sleeping Lunch: skips or sometimes 3-4pm-- soup; [place] sandwich with cheese; chicken wings bbq sauce with fries; chicken breast, salad; leftovers; sometiemes protein shake Snack: none Supper: 8pm--  frozen meals ie [employer] pot pie; loves mac and cheese, grapes, apples, bananas, bread only occasionally; sometimes protein  shake Snack: during the night-- cashews, popcorn, ice cream Beverages: about 32oz water, keeps 32oz water bottle close by; juice; sparkling ice drink; rarely soda ie pepsi cherry zero Uses air fryer for cooking, does not deep fry foods. Uses salad plates for meals  Nutrition Care Education: Topics covered:  Basic nutrition: basic food groups, appropriate nutrient balance, appropriate meal and snack schedule, general nutrition guidelines    Weight control: benefits of structured eating pattern; appropriate food portions; estimated energy needs for weight loss at 1400 kcal, provided guidance for 40 % CHO, 30% pro, 30% fat; discussed effects of inadequate sleep, stress on weight and metabolism; discussed role of physical activity  Nutritional Diagnosis:  [place]-3.3 Overweight/obesity As related to excess calories, inadequate physical activity, stress, insomnia, hormonal imbalance.  As evidenced by patient with current BMI of 47.4.  Intervention:   Instruction and discussion as noted above.  Patient would like to resume taking weight loss medication; advised her to discuss with MD.   Patient voices understanding of habits that need to change and feels ready to work on positive changes.   Established specific goals for change with direction from patient.  Education Materials given:   Museum/gallery conservator with food lists, sample meal pattern  Visit summary with goals/ instructions   Learner/ who was taught:   Patient   Level of understanding:  Verbalizes/ demonstrates competency   Demonstrated degree of understanding via:   Teach back Learning barriers:  None  Willingness to learn/ readiness for change:  Acceptance, ready for change   Monitoring and Evaluation:  Dietary intake, exercise, sleep, and body weight  follow up: 04/02/20 at 11:00am

## 2020-03-07 LAB — URINALYSIS, COMPLETE W/RFL CULTURE
Bilirubin Urine: NEGATIVE
Glucose, UA: NEGATIVE
Hgb urine dipstick: NEGATIVE
Hyaline Cast: NONE SEEN /LPF
Ketones, ur: NEGATIVE
Nitrites, Initial: NEGATIVE
Protein, ur: NEGATIVE
RBC / HPF: NONE SEEN /HPF (ref 0–2)
Specific Gravity, Urine: 1.015 (ref 1.001–1.03)
pH: 5.5 (ref 5.0–8.0)

## 2020-03-07 LAB — URINE CULTURE
MICRO NUMBER:: 11209450
SPECIMEN QUALITY:: ADEQUATE

## 2020-03-07 LAB — CULTURE INDICATED

## 2020-03-14 ENCOUNTER — Other Ambulatory Visit: Admission: RE | Admit: 2020-03-14 | Payer: Medicare Other | Source: Ambulatory Visit

## 2020-03-18 ENCOUNTER — Ambulatory Visit: Admit: 2020-03-18 | Payer: Medicare Other | Admitting: Gastroenterology

## 2020-03-18 ENCOUNTER — Other Ambulatory Visit: Payer: Self-pay

## 2020-03-18 DIAGNOSIS — Z1211 Encounter for screening for malignant neoplasm of colon: Secondary | ICD-10-CM | POA: Insufficient documentation

## 2020-03-18 SURGERY — COLONOSCOPY WITH PROPOFOL
Anesthesia: General

## 2020-03-18 NOTE — Progress Notes (Signed)
Procedure rescheduled and updated instructions sent.

## 2020-03-20 ENCOUNTER — Other Ambulatory Visit: Payer: Self-pay

## 2020-03-20 ENCOUNTER — Other Ambulatory Visit: Payer: Medicare Other

## 2020-03-20 ENCOUNTER — Other Ambulatory Visit: Payer: Self-pay | Admitting: Anesthesiology

## 2020-03-20 DIAGNOSIS — D72828 Other elevated white blood cell count: Secondary | ICD-10-CM

## 2020-03-20 LAB — CBC WITH DIFFERENTIAL/PLATELET
Absolute Monocytes: 719 cells/uL (ref 200–950)
Basophils Absolute: 47 cells/uL (ref 0–200)
Basophils Relative: 0.6 %
Eosinophils Absolute: 126 cells/uL (ref 15–500)
Eosinophils Relative: 1.6 %
HCT: 37.9 % (ref 35.0–45.0)
Hemoglobin: 12.8 g/dL (ref 11.7–15.5)
Lymphs Abs: 2291 cells/uL (ref 850–3900)
MCH: 31.1 pg (ref 27.0–33.0)
MCHC: 33.8 g/dL (ref 32.0–36.0)
MCV: 92 fL (ref 80.0–100.0)
MPV: 10.3 fL (ref 7.5–12.5)
Monocytes Relative: 9.1 %
Neutro Abs: 4716 cells/uL (ref 1500–7800)
Neutrophils Relative %: 59.7 %
Platelets: 346 10*3/uL (ref 140–400)
RBC: 4.12 10*6/uL (ref 3.80–5.10)
RDW: 13 % (ref 11.0–15.0)
Total Lymphocyte: 29 %
WBC: 7.9 10*3/uL (ref 3.8–10.8)

## 2020-03-20 MED ORDER — PHENTERMINE HCL 37.5 MG PO CAPS: 37.5000 mg | ORAL_CAPSULE | ORAL | 2 refills | Status: DC

## 2020-03-24 ENCOUNTER — Ambulatory Visit (INDEPENDENT_AMBULATORY_CARE_PROVIDER_SITE_OTHER): Payer: Medicare Other | Admitting: Podiatry

## 2020-03-24 ENCOUNTER — Other Ambulatory Visit: Payer: Self-pay

## 2020-03-24 ENCOUNTER — Encounter: Payer: Self-pay | Admitting: Podiatry

## 2020-03-24 DIAGNOSIS — M722 Plantar fascial fibromatosis: Secondary | ICD-10-CM | POA: Diagnosis not present

## 2020-03-24 MED ORDER — TRIAMCINOLONE ACETONIDE 40 MG/ML IJ SUSP
40.0000 mg | Freq: Once | INTRAMUSCULAR | Status: AC
  Administered 2020-03-24: 40 mg

## 2020-03-24 NOTE — Progress Notes (Signed)
She presents today for follow-up of her bilateral heel pain.  States that I think you may need my shots I am really not sure.  Objective: Vital signs are stable alert and oriented x3.  She has no pain on palpation medial calcaneal tubercles no pain on medial lateral compression of calcaneus.  Assessment: Plantar fasciitis bilateral.  Plan: At this point were not going give her another injection today I will follow-up with her in 1 month

## 2020-03-31 ENCOUNTER — Telehealth: Payer: Self-pay | Admitting: Hematology and Oncology

## 2020-03-31 NOTE — Telephone Encounter (Signed)
Rescheduled 12/29 appt per provider PAL. Pt confirmed appt cancellation and new appt date and time.

## 2020-04-02 ENCOUNTER — Encounter: Payer: Medicare Other | Admitting: Dietician

## 2020-04-02 ENCOUNTER — Other Ambulatory Visit: Payer: Self-pay

## 2020-04-02 ENCOUNTER — Encounter: Payer: Self-pay | Admitting: Dietician

## 2020-04-02 NOTE — Progress Notes (Signed)
Patient came for MNT follow up visit but did not stay, as her primary insurance recently denied the claim. She will follow up with the insurance and ensure coverage before returning for follow up. Did schedule another visit for 05/06/20.  Patient's weight was measured today at 281.5lbs, a loss of 4lbs since previous visit on 03/05/20.  She reports now taking Phentermine which has decreased appetite; patient is now consuming 2 meal replacement shakes for breakfast and lunch daily and eating a dinner of solid foods. She typically has a nighttime snack of honey nut cheerios. She continues to work on increasing fluids and eating at regular intervals.

## 2020-04-15 ENCOUNTER — Other Ambulatory Visit: Payer: Self-pay | Admitting: Internal Medicine

## 2020-04-15 ENCOUNTER — Ambulatory Visit: Payer: Medicare Other | Attending: Internal Medicine

## 2020-04-15 DIAGNOSIS — Z23 Encounter for immunization: Secondary | ICD-10-CM

## 2020-04-15 NOTE — Progress Notes (Signed)
   Covid-19 Vaccination Clinic  Name:  Krystal Kelley    MRN: [id number] DOB: [birth year]  04/15/2020  [name] was observed post Covid-19 immunization for 15 minutes without incident. She was provided with Vaccine Information Sheet and instruction to access the V-Safe system.   [name] was instructed to call 911 with any severe reactions post vaccine: [name] Difficulty breathing  . Swelling of face and throat  . A fast heartbeat  . A bad rash all over body  . Dizziness and weakness   Immunizations Administered    Name Date Dose VIS Date Route   Pfizer COVID-19 Vaccine 04/15/2020  2:11 PM 0.3 mL 02/06/2020 Intramuscular   Manufacturer: [employer], [employer]   Lot: [id number]   NDC: [address]-8

## 2020-04-16 ENCOUNTER — Ambulatory Visit: Payer: Medicare Other | Admitting: Hematology and Oncology

## 2020-04-18 ENCOUNTER — Emergency Department
Admission: EM | Admit: 2020-04-18 | Discharge: 2020-04-18 | Disposition: A | Discharge status: Home / Self Care | Payer: Medicare Other | Attending: Emergency Medicine | Admitting: Emergency Medicine

## 2020-04-18 ENCOUNTER — Other Ambulatory Visit: Payer: Self-pay

## 2020-04-18 DIAGNOSIS — Z20822 Contact with and (suspected) exposure to covid-19: Secondary | ICD-10-CM

## 2020-04-18 DIAGNOSIS — U071 COVID-19: Secondary | ICD-10-CM | POA: Diagnosis not present

## 2020-04-18 DIAGNOSIS — J069 Acute upper respiratory infection, unspecified: Secondary | ICD-10-CM

## 2020-04-18 DIAGNOSIS — Z853 Personal history of malignant neoplasm of breast: Secondary | ICD-10-CM | POA: Diagnosis not present

## 2020-04-18 DIAGNOSIS — R059 Cough, unspecified: Secondary | ICD-10-CM | POA: Diagnosis present

## 2020-04-18 MED ORDER — PSEUDOEPH-BROMPHEN-DM 30-2-10 MG/5ML PO SYRP: 5.0000 mL | ORAL_SOLUTION | Freq: Four times a day (QID) | ORAL | 0 refills | Status: DC | PRN

## 2020-04-18 NOTE — ED Provider Notes (Signed)
[facility] Emergency Department Provider Note   ____________________________________________   Event Date/Time   First MD Initiated Contact with Patient 04/18/20 1516     (approximate)  I have reviewed the triage vital signs and the nursing notes.   HISTORY  Chief Complaint Cough    HPI Krystal Kelley is a 49 y.o. female patient presents with 4 days of productive cough.  Patient states sputum is clear.  Patient recently tested negative for COVID-19 last week.  Patient that she exposed to a family member who tested positive Covid 3.  Patient has completed Covid vaccine to include a booster in the past month.         Past Medical History:  Diagnosis Date  . Anxiety   . Breast cancer, right breast ([place])   . Depression   . H/O laparoscopy   . History of hysteroscopy   . Peripheral edema    on lasix  . PONV (postoperative nausea and vomiting)   . Radiation 10/30/14-12/17/14   right breast 50.4 gray    Patient Active Problem List   Diagnosis Date Noted  . Encounter for screening colonoscopy 03/18/2020  . Trigger middle finger of right hand 01/24/2019  . Post-operative state 05/22/2018  . Postoperative state 05/22/2018  . Pain 07/13/2017  . Posttraumatic stress disorder 12/09/2016  . Leg edema, right 10/03/2014  . Genetic testing 07/10/2014  . Breast cancer of lower-inner quadrant of right female breast ([place]) 05/29/2014    Past Surgical History:  Procedure Laterality Date  . BREAST BIOPSY  05/16/14  . BREAST LUMPECTOMY Right 06/18/2014  . bunion surgrery    . CARPAL TUNNEL RELEASE Left 09/21/2016   Procedure: LEFT CARPAL TUNNEL RELEASE;  Surgeon: Daryll Brod, MD;  Location: [place];  Service: Orthopedics;  Laterality: Left;  REG/FAB  . carpel tunnel    . CHOLECYSTECTOMY  2015  . HYSTERECTOMY ABDOMINAL WITH SALPINGO-OOPHORECTOMY  05/22/2018   Procedure: HYSTERECTOMY ABDOMINAL WITH BILATERAL SALPINGO-OOPHORECTOMY;   Surgeon: [name], MD;  Location: [place];  Service: Gynecology;;  . PLANTAR FASCIECTOMY    . PORTACATH PLACEMENT Left 06/18/2014   Procedure: INSERTION PORT-A-CATH;  Surgeon: Excell Seltzer, MD;  Location: [place];  Service: General;  Laterality: Left;  . ROBOTIC ASSISTED LAPAROSCOPIC LYSIS OF ADHESION  05/22/2018   Procedure: XI ROBOTIC ASSISTED LAPAROSCOPIC LYSIS OF ADHESION;  Surgeon: [name], MD;  Location: [place];  Service: Gynecology;;  . ROBOTIC ASSISTED TOTAL HYSTERECTOMY WITH BILATERAL SALPINGO OOPHERECTOMY Bilateral 05/22/2018   Procedure: ATTEMPTED XI ROBOTIC ASSISTED TOTAL HYSTERECTOMY WITH BILATERAL SALPINGO OOPHORECTOMY;  Surgeon: [name], MD;  Location: [place];  Service: Gynecology;  Laterality: Bilateral;    Prior to Admission medications   Medication Sig Start Date End Date Taking? Authorizing Provider  brompheniramine-pseudoephedrine-DM 30-2-10 MG/5ML syrup Take 5 mLs by mouth 4 (four) times daily as needed. 04/18/20  Yes Sable Feil, PA-C  acetaminophen (TYLENOL) 500 MG tablet Take 500-1,000 mg by mouth every 6 (six) hours as needed (pain.).     [provider]  anastrozole (ARIMIDEX) 1 MG tablet Take 1 tablet (1 mg total) by mouth daily. 04/17/19   Nicholas Lose, MD  Calcium Carbonate (CALCIUM 500 PO) Take by mouth.    [provider]  cycloSPORINE (RESTASIS) 0.05 % ophthalmic emulsion Place 1 drop into both eyes 2 (two) times daily.    [provider]  Eluxadoline (VIBERZI) 100 MG TABS Take 1 tablet (  100 mg total) by mouth 2 (two) times daily. 01/18/17   Midge Minium, MD  EUCRISA 2 % OINT  01/04/19   [provider]  Flaxseed, Linseed, (FLAXSEED OIL) 1000 MG CAPS Take 1,000 mg by mouth daily.    [provider]  fluticasone (FLONASE) 50 MCG/ACT nasal spray Place 1 spray into both nostrils daily as needed for allergies.     [provider]  furosemide (LASIX) 20 MG tablet TAKE 3 TABLETS(60 MG) BY MOUTH DAILY Patient taking differently: Take 20 mg by mouth 3 (three) times daily.  01/10/18   Serena Croissant, MD  ketoconazole (NIZORAL) 2 % cream Apply 1 application topically daily as needed for irritation (irritated/itchy skin.).     [provider]  melatonin 5 MG TABS Take 5 mg by mouth.    [provider]  meloxicam (MOBIC) 15 MG tablet Take 15 mg by mouth daily. Taking occasionally as needed 02/12/20   [provider]  Multiple Vitamin (MULTIVITAMIN WITH MINERALS) TABS tablet Take 1 tablet by mouth daily. One-A-Day Multivitamin     [provider]  phentermine 37.5 MG capsule Take 1 capsule (37.5 mg total) by mouth every morning. 03/20/20   Genia Del, MD  valACYclovir (VALTREX) 500 MG tablet Take 500 mg by mouth 2 (two) times daily as needed (outbreaks.).     [provider]  vitamin C (ASCORBIC ACID) 500 MG tablet Take 500 mg by mouth daily.     [provider]  Zinc Sulfate (ZINC 15 PO) Take by mouth.    [provider]    Allergies Patient has no known allergies.  Family History  Problem Relation Age of Onset  . Kidney cancer Father        kidney  . Prostate cancer Maternal Uncle        deceased 32  . Lung cancer Maternal Grandfather        deceased 67; smoker  . Colon cancer Paternal Grandfather        deceased 61s  . Cancer Other        pat half-sister; ? uterine vs. ovarian ca    Social History Social History   Tobacco Use  . Smoking status: Never Smoker  . Smokeless tobacco: Never Used  Vaping Use  . Vaping Use: Never used  Substance Use Topics  . Alcohol use: Yes    Comment: wine- occ   . Drug use: No    Review of Systems Constitutional: No fever/chills Eyes: No visual changes. ENT: No sore throat. Cardiovascular: Denies chest pain. Respiratory: Denies shortness of breath.  Productive  cough. Gastrointestinal: No abdominal pain.  No nausea, no vomiting.  No diarrhea.  No constipation. Genitourinary: Negative for dysuria. Musculoskeletal: Negative for back pain. Skin: Negative for rash. Neurological: Negative for headaches, focal weakness or numbness. Psychiatric:  Anxiety and depression  ____________________________________________   PHYSICAL EXAM:  VITAL SIGNS: ED Triage Vitals  Enc Vitals Group     BP 04/18/20 1430 (!) 152/99     Pulse Rate 04/18/20 1430 (!) 108     Resp 04/18/20 1430 18     Temp 04/18/20 1430 98.8 F (37.1 C)     Temp src --      SpO2 04/18/20 1430 99 %     Weight 04/18/20 1431 275 lb (124.7 kg)     Height 04/18/20 1431 5\' 5"  (1.651 m)     Head Circumference --      Peak Flow --  Pain Score 04/18/20 1431 0     Pain Loc --      Pain Edu? --      Excl. in GC? --    Constitutional: Alert and oriented. Well appearing and in no acute distress.  Nose: No congestion/rhinnorhea. Mouth/Throat: Mucous membranes are moist.  Oropharynx non-erythematous. Neck: No stridor. Cardiovascular: Normal rate, regular rhythm. Grossly normal heart sounds.  Good peripheral circulation. Respiratory: Normal respiratory effort.  No retractions. Lungs CTAB. Genitourinary: Deferred Neurologic:  Normal speech and language. No gross focal neurologic deficits are appreciated. No gait instability. Skin:  Skin is warm, dry and intact. No rash noted. Psychiatric: Mood and affect are normal. Speech and behavior are normal.  ____________________________________________   LABS (all labs ordered are listed, but only abnormal results are displayed)  Labs Reviewed  SARS CORONAVIRUS 2 (TAT 6-24 HRS)   ____________________________________________  EKG   ____________________________________________  RADIOLOGY I, Joni Reining, personally viewed and evaluated these images (plain radiographs) as part of my medical decision making, as well as reviewing the  written report by the radiologist.  ED MD interpretation:    Official radiology report(s): No results found.  ____________________________________________   PROCEDURES  Procedure(s) performed (including Critical Care):  Procedures   ____________________________________________   INITIAL IMPRESSION / ASSESSMENT AND PLAN / ED COURSE  As part of my medical decision making, I reviewed the following data within the electronic MEDICAL RECORD NUMBER         Patient presents for this productive cough.  Patient was concerned for COVID-19 since she had close exposure due to family member.  Patient has taken both Covid 19 vaccine.  Patient took a booster last month.  Advised patient results of COVID-19 test will be in the MyChart app in approximately 6 to 8 hours from her visit.  Patient advised if test is positive she must quarantine for 5 days per new CDC recommendations.  Follow-up with PCP.   ____________________________________________   FINAL CLINICAL IMPRESSION(S) / ED DIAGNOSES  Final diagnoses:  Close exposure to COVID-19 virus  Viral URI with cough     ED Discharge Orders         Ordered    brompheniramine-pseudoephedrine-DM 30-2-10 MG/5ML syrup  4 times daily PRN        04/18/20 1531          *Please note:  Krystal Kelley was evaluated in Emergency Department on 04/18/2020 for the symptoms described in the history of present illness. She was evaluated in the context of the global COVID-19 pandemic, which necessitated consideration that the patient might be at risk for infection with the SARS-CoV-2 virus that causes COVID-19. Institutional protocols and algorithms that pertain to the evaluation of patients at risk for COVID-19 are in a state of rapid change based on information released by regulatory bodies including the CDC and federal and state organizations. These policies and algorithms were followed during the patient's care in the ED.  Some ED evaluations and  interventions may be delayed as a result of limited staffing during and the pandemic.*   Note:  This document was prepared using Dragon voice recognition software and may include unintentional dictation errors.    Joni Reining, PA-C 04/18/20 1536    Dionne Bucy, MD 04/18/20 [phone]

## 2020-04-18 NOTE — ED Triage Notes (Signed)
Pt here via POV from home with complaints of productive cough x4 days, sputum is clear. Pt recently covid tested before christmas with negative results. Pt reports she was exposed to a family member after that and wants a covid test.

## 2020-04-18 NOTE — Discharge Instructions (Addendum)
Your COVID-19 test is pending.  You may follow results in the MyChart app.  It would take 4 to 6 hours to appear in the app.  If positive you must quarantine for additional 5 days per new CDC recommendations.  Take medication as directed.

## 2020-04-19 LAB — SARS CORONAVIRUS 2 (TAT 6-24 HRS): SARS Coronavirus 2: POSITIVE — AB

## 2020-04-21 ENCOUNTER — Telehealth: Payer: Self-pay

## 2020-04-21 DIAGNOSIS — Z1211 Encounter for screening for malignant neoplasm of colon: Secondary | ICD-10-CM

## 2020-04-21 NOTE — Telephone Encounter (Signed)
Colonoscopy has been rescheduled from 04/29/20 with Dr. Servando Snare to 05/27/20 with Dr. Servando Snare due to 04/18/21 Positive COVID Test performed through [facility].  Patient has been advised that she will not need repeat COVID testing since she tested positive within 30 days.  New instructions sent via mychart and in mail to reflect date change. Referral updated.  Thanks,  [place], [place]

## 2020-04-25 ENCOUNTER — Ambulatory Visit: Payer: Medicare Other | Admitting: Hematology and Oncology

## 2020-05-05 ENCOUNTER — Ambulatory Visit: Payer: Medicare Other | Admitting: Podiatry

## 2020-05-06 ENCOUNTER — Ambulatory Visit: Payer: Medicare Other | Admitting: Dietician

## 2020-05-12 NOTE — Progress Notes (Incomplete)
Patient Care Team: Albina Billet, MD as PCP - General (Internal Medicine) Albina Billet, MD (Internal Medicine) Nicholas Lose, MD as Consulting Physician (Hematology and Oncology) Excell Seltzer, MD (Inactive) as Consulting Physician (General Surgery) Gery Pray, MD as Consulting Physician (Radiation Oncology) Delice Bison Charlestine Massed, NP as Nurse Practitioner (Hematology and Oncology)  DIAGNOSIS: No diagnosis found.  SUMMARY OF ONCOLOGIC HISTORY: Oncology History  Breast cancer of lower-inner quadrant of right female breast ([place])  05/14/2014 Mammogram   Right mammogram: 1.5 cm round mass in right breast at 3:00   05/14/2014 Breast US   Right breast: irregular, hypoechoic mass with indistinct margins at 3:00 mearying 1.1 x 0.8 x 1.3 cm.   05/16/2014 Initial Biopsy   Ultrasound-guided biopsy: Invasive ductal carcinoma with DCIS, grade 1-2, ER/PR positive, Ki-67 43%, HER-2 positive   05/28/2014 Breast MRI   1.4 x 1.2 x 1.0 cm biopsy-proven invasive ductal carcinoma and ductal carcinoma in situ in the lower inner quadrant of the right breast.    05/28/2014 Clinical Stage   Stage IA: T1c N0   06/13/2014 Procedure   Breast/Next: Variant of Unknown Significance in the ATM gene called p.4477C>T, p.[id number]. Otherwise, no clinically significant mutation at ATM, BARD1, BRCA1, BRCA2, BRIP1, CDH1, CHEK2, MRE11A, MUTYH, NBN, NF1, PALB2, PTEN, RAD50, RAD51C, RAD51D, and TP53   06/18/2014 Surgery   Right breast lumpectomy: Invasive ductal carcinoma 1.3 cm negative for LVI; DCIS 1 mm from margins, 1 sentinel node negative, grade 3 ER 95%, PR 77%, HER-2 amplified ratio 8.25, Ki-67 43%   06/18/2014 Pathologic Stage   Stage IA: T1c N0 M0   07/18/2014 - 10/03/2014 Chemotherapy   Abraxane Herceptin weekly 12 followed by Herceptin maintenance every 3 weeks to complete one year completed 07/03/2015   10/30/2014 - 12/17/2014 Radiation Therapy   Adjuvant radiation therapy: Right breast 50.4 gray in 28  fractions, lumpectomy cavity boost 12 gray in 6 fractions   02/01/2015 -  Anti-estrogen oral therapy   Tamoxifen 20 mg daily, switched to anastrozole on 09/12/18 after hysterectomy      CHIEF COMPLIANT: Follow-up of right breast cancer onanastrozole  INTERVAL HISTORY: Krystal Kelley is a 50 y.o. with above-mentioned history of right breast cancer treated with lumpectomy,adjuvant chemotherapy with Herceptin, andradiation whois currently onanastrozole.Mammogram on 09/18/19 showed no evidence of malignancy bilaterally. She presents to the clinic today for follow-up.   ALLERGIES:  has No Known Allergies.  MEDICATIONS:  Current Outpatient Medications  Medication Sig Dispense Refill  . acetaminophen (TYLENOL) 500 MG tablet Take 500-1,000 mg by mouth every 6 (six) hours as needed (pain.).     [name] anastrozole (ARIMIDEX) 1 MG tablet Take 1 tablet (1 mg total) by mouth daily. 90 tablet 3  . brompheniramine-pseudoephedrine-DM 30-2-10 MG/5ML syrup Take 5 mLs by mouth 4 (four) times daily as needed. 120 mL 0  . Calcium Carbonate (CALCIUM 500 PO) Take by mouth.    . cycloSPORINE (RESTASIS) 0.05 % ophthalmic emulsion Place 1 drop into both eyes 2 (two) times daily.    . Eluxadoline (VIBERZI) 100 MG TABS Take 1 tablet (100 mg total) by mouth 2 (two) times daily. 60 tablet 5  . EUCRISA 2 % OINT     . Flaxseed, Linseed, (FLAXSEED OIL) 1000 MG CAPS Take 1,000 mg by mouth daily.    . fluticasone (FLONASE) 50 MCG/ACT nasal spray Place 1 spray into both nostrils daily as needed for allergies.    . furosemide (LASIX) 20 MG tablet TAKE 3 TABLETS(60 MG) BY MOUTH  DAILY (Patient taking differently: Take 20 mg by mouth 3 (three) times daily. ) 90 tablet 0  . ketoconazole (NIZORAL) 2 % cream Apply 1 application topically daily as needed for irritation (irritated/itchy skin.).     [name] melatonin 5 MG TABS Take 5 mg by mouth.    . meloxicam (MOBIC) 15 MG tablet Take 15 mg by mouth daily. Taking occasionally as needed     . Multiple Vitamin (MULTIVITAMIN WITH MINERALS) TABS tablet Take 1 tablet by mouth daily. One-A-Day Multivitamin     . phentermine 37.5 MG capsule Take 1 capsule (37.5 mg total) by mouth every morning. 30 capsule 2  . valACYclovir (VALTREX) 500 MG tablet Take 500 mg by mouth 2 (two) times daily as needed (outbreaks.).     [name] vitamin C (ASCORBIC ACID) 500 MG tablet Take 500 mg by mouth daily.     . Zinc Sulfate (ZINC 15 PO) Take by mouth.     No current facility-administered medications for this visit.    PHYSICAL EXAMINATION: ECOG PERFORMANCE STATUS: {CHL ONC ECOG PS:[phone]}  There were no vitals filed for this visit. There were no vitals filed for this visit.  BREAST:*** No palpable masses or nodules in either right or left breasts. No palpable axillary supraclavicular or infraclavicular adenopathy no breast tenderness or nipple discharge. (exam performed in the presence of a chaperone)  LABORATORY DATA:  I have reviewed the data as listed CMP Latest Ref Rng & Units 02/29/2020 01/10/2019 01/06/2018  Glucose 65 - 99 mg/dL 115(H) 96 97  BUN 7 - 25 mg/dL '14 12 11  ' Creatinine 0.50 - 1.05 mg/dL 0.84 1.02 0.89  Sodium 135 - 146 mmol/L 139 141 140  Potassium 3.5 - 5.3 mmol/L 4.2 3.6 3.9  Chloride 98 - 110 mmol/L 103 101 105  CO2 20 - 32 mmol/L '26 29 27  ' Calcium 8.6 - 10.4 mg/dL 10.0 9.5 9.4  Total Protein 6.1 - 8.1 g/dL 7.4 7.8 6.8  Total Bilirubin 0.2 - 1.2 mg/dL 0.4 0.4 0.3  Alkaline Phos 38 - 126 U/L - - -  AST 10 - 35 U/L 20 38(H) 24  ALT 6 - 29 U/L 30(H) 38(H) 24    Lab Results  Component Value Date   WBC 7.9 03/20/2020   HGB 12.8 03/20/2020   HCT 37.9 03/20/2020   MCV 92.0 03/20/2020   PLT 346 03/20/2020   NEUTROABS 4,716 03/20/2020    ASSESSMENT & PLAN:  No problem-specific Assessment & Plan notes found for this encounter.    No orders of the defined types were placed in this encounter.  The patient has a good understanding of the overall plan. she agrees with  it. she will call with any problems that may develop before the next visit here.  Total time spent: *** mins including face to face time and time spent for planning, charting and coordination of care  Nicholas Lose, MD 05/12/2020  I, Cloyde Reams Dorshimer, am acting as scribe for Dr. Nicholas Lose.  {insert scribe attestation}

## 2020-05-13 ENCOUNTER — Ambulatory Visit: Payer: Medicare Other | Admitting: Hematology and Oncology

## 2020-05-13 NOTE — Assessment & Plan Note (Deleted)
Right breast lumpectomy 06/18/2014: Invasive ductal carcinoma 1.3 cm negative for LVI; DCIS 1 mm from margins, 1 sentinel node negative, grade 3 ER 95%, PR 77%, HER-2 amplified ratio 8.25, Ki-67 43% T1 cN0 M0 stage IA  Treatment summary S/P Adjuvant chemo with Abraxane Herceptin started 07/18/2014 completed 10/03/14, Herceptin maintenance completed 07/03/2015 Status post adjuvant radiation completed 12/17/2014 Started tamoxifen09/2016 ---------------------------------------------------------------------------- Current treatment:Tamoxifen 20 mg daily09/2016switched to anastrozole 09/12/2018 since she had hysterectomy  Anastrozole toxicities:Occ hot flashes  Breast cancer surveillance: 1.Breast exam 05/13/2020: Benign  2.mammogram  09/18/2019 at [facility]: Benign with breast density categoryA 12/30/2017: CT abdomen pelvis for left-sided abdominal pain: Fatty liver, uterine fibroid  Obesity:Gained weight because she is not watching her diet.  I instructed her that this should be her goal for 2021.  Return to clinic in 1 year for follow-up

## 2020-05-14 ENCOUNTER — Encounter: Payer: Self-pay | Admitting: Dietician

## 2020-05-14 NOTE — Progress Notes (Signed)
Patient missed second scheduled appointment on 05/06/20, after previous appointment was cancelled due to concerns regarding coverage. Sent notification to referring provider.

## 2020-05-19 ENCOUNTER — Ambulatory Visit: Payer: Medicare Other | Admitting: Podiatry

## 2020-05-23 ENCOUNTER — Other Ambulatory Visit: Payer: Medicare Other

## 2020-05-26 ENCOUNTER — Encounter: Payer: Self-pay | Admitting: Gastroenterology

## 2020-05-27 ENCOUNTER — Encounter
Admission: RE | Disposition: A | Discharge status: Home / Self Care | Payer: Self-pay | Source: Home / Self Care | Attending: Gastroenterology

## 2020-05-27 ENCOUNTER — Ambulatory Visit: Payer: Medicare Other | Admitting: Certified Registered"

## 2020-05-27 ENCOUNTER — Encounter: Payer: Self-pay | Admitting: Gastroenterology

## 2020-05-27 ENCOUNTER — Other Ambulatory Visit: Payer: Self-pay

## 2020-05-27 ENCOUNTER — Ambulatory Visit
Admission: RE | Admit: 2020-05-27 | Discharge: 2020-05-27 | Disposition: A | Discharge status: Home / Self Care | Payer: Medicare Other | Attending: Gastroenterology | Admitting: Gastroenterology

## 2020-05-27 DIAGNOSIS — Z79899 Other long term (current) drug therapy: Secondary | ICD-10-CM | POA: Diagnosis not present

## 2020-05-27 DIAGNOSIS — Z801 Family history of malignant neoplasm of trachea, bronchus and lung: Secondary | ICD-10-CM | POA: Insufficient documentation

## 2020-05-27 DIAGNOSIS — Z79811 Long term (current) use of aromatase inhibitors: Secondary | ICD-10-CM | POA: Insufficient documentation

## 2020-05-27 DIAGNOSIS — Z8042 Family history of malignant neoplasm of prostate: Secondary | ICD-10-CM | POA: Diagnosis not present

## 2020-05-27 DIAGNOSIS — Z800 Family history of malignant neoplasm of digestive organs: Secondary | ICD-10-CM | POA: Diagnosis not present

## 2020-05-27 DIAGNOSIS — Z1211 Encounter for screening for malignant neoplasm of colon: Secondary | ICD-10-CM | POA: Diagnosis present

## 2020-05-27 DIAGNOSIS — K5730 Diverticulosis of large intestine without perforation or abscess without bleeding: Secondary | ICD-10-CM | POA: Diagnosis not present

## 2020-05-27 DIAGNOSIS — Z923 Personal history of irradiation: Secondary | ICD-10-CM | POA: Insufficient documentation

## 2020-05-27 DIAGNOSIS — C50911 Malignant neoplasm of unspecified site of right female breast: Secondary | ICD-10-CM | POA: Insufficient documentation

## 2020-05-27 DIAGNOSIS — Z8051 Family history of malignant neoplasm of kidney: Secondary | ICD-10-CM | POA: Insufficient documentation

## 2020-05-27 DIAGNOSIS — Z6841 Body Mass Index (BMI) 40.0 and over, adult: Secondary | ICD-10-CM | POA: Insufficient documentation

## 2020-05-27 HISTORY — PX: COLONOSCOPY WITH PROPOFOL: SHX5780

## 2020-05-27 SURGERY — COLONOSCOPY WITH PROPOFOL
Anesthesia: General

## 2020-05-27 MED ORDER — PROPOFOL 10 MG/ML IV BOLUS
INTRAVENOUS | Status: AC
  Filled 2020-05-27: qty 40

## 2020-05-27 MED ORDER — PROPOFOL 10 MG/ML IV BOLUS
INTRAVENOUS | Status: DC | PRN
  Administered 2020-05-27: 60 mg via INTRAVENOUS

## 2020-05-27 MED ORDER — PROPOFOL 10 MG/ML IV BOLUS
INTRAVENOUS | Status: AC
  Filled 2020-05-27: qty 20

## 2020-05-27 MED ORDER — SODIUM CHLORIDE 0.9 % IV SOLN: INTRAVENOUS | Status: DC

## 2020-05-27 MED ORDER — PROPOFOL 500 MG/50ML IV EMUL
INTRAVENOUS | Status: DC | PRN
  Administered 2020-05-27: 150 ug/kg/min via INTRAVENOUS

## 2020-05-27 NOTE — Op Note (Signed)
[facility] Gastroenterology Patient Name: Krystal Kelley Procedure Date: 05/27/2020 8:36 AM MRN: [id number] Account #: [id number] Date of Birth: 1970/07/15 Admit Type: Outpatient Age: 50 Room: [facility] ENDO ROOM 3 Gender: Female Note Status: Finalized Procedure:             Colonoscopy Indications:           Screening for colorectal malignant neoplasm Providers:             Lucilla Lame MD, MD Referring MD:          Leona Carry. Hall Busing, MD (Referring MD) Medicines:             Propofol per Anesthesia Complications:         No immediate complications. Procedure:             Pre-Anesthesia Assessment:                        - Prior to the procedure, a History and Physical was                         performed, and patient medications and allergies were                         reviewed. The patient's tolerance of previous                         anesthesia was also reviewed. The risks and benefits                         of the procedure and the sedation options and risks                         were discussed with the patient. All questions were                         answered, and informed consent was obtained. Prior                         Anticoagulants: The patient has taken no previous                         anticoagulant or antiplatelet agents. ASA Grade                         Assessment: II - A patient with mild systemic disease.                         After reviewing the risks and benefits, the patient                         was deemed in satisfactory condition to undergo the                         procedure.                        After obtaining informed consent, the colonoscope was  passed under direct vision. Throughout the procedure,                         the patient's blood pressure, pulse, and oxygen                         saturations were monitored continuously. The                         Colonoscope was introduced through the  anus and                         advanced to the the cecum, identified by appendiceal                         orifice and ileocecal valve. The colonoscopy was                         performed without difficulty. The patient tolerated                         the procedure well. The quality of the bowel                         preparation was excellent. Findings:      The perianal and digital rectal examinations were normal.      A few small-mouthed diverticula were found in the sigmoid colon. Impression:            - Diverticulosis in the sigmoid colon.                        - No specimens collected. Recommendation:        - Discharge patient to home.                        - Resume previous diet.                        - Continue present medications.                        - Repeat colonoscopy in 10 years for screening                         purposes.                        - unless any change in family history or lower GI                         problems. Procedure Code(s):     --- Professional ---                        [phone], Colonoscopy, flexible; diagnostic, including                         collection of specimen(s) by brushing or washing, when                         performed (  separate procedure) Diagnosis Code(s):     --- Professional ---                        Z12.11, Encounter for screening for malignant neoplasm                         of colon CPT copyright 2019 American Medical Association. All rights reserved. The codes documented in this report are preliminary and upon coder review may  be revised to meet current compliance requirements. Lucilla Lame MD, MD 05/27/2020 8:55:33 AM This report has been signed electronically. Number of Addenda: 0 Note Initiated On: 05/27/2020 8:36 AM Scope Withdrawal Time: 0 hours 7 minutes 31 seconds  Total Procedure Duration: 0 hours 13 minutes 15 seconds  Estimated Blood Loss:  Estimated blood loss: none.      [facility]

## 2020-05-27 NOTE — Anesthesia Preprocedure Evaluation (Addendum)
Anesthesia Evaluation  Patient identified by MRN, date of birth, ID band Patient awake    Reviewed: Allergy & Precautions, H&P , NPO status , Patient's Chart, lab work & pertinent test results  History of Anesthesia Complications (+) PONVNegative for: history of anesthetic complications  Airway Mallampati: II  TM Distance: >3 FB     Dental  (+) Teeth Intact   Pulmonary neg pulmonary ROS, neg sleep apnea, neg COPD,    breath sounds clear to auscultation       Cardiovascular (-) angina(-) Past MI and (-) Cardiac Stents negative cardio ROS  (-) dysrhythmias  Rhythm:regular Rate:Normal     Neuro/Psych PSYCHIATRIC DISORDERS Anxiety Depression negative neurological ROS     GI/Hepatic negative GI ROS, Neg liver ROS,   Endo/Other  Morbid obesity  Renal/GU negative Renal ROS  negative genitourinary   Musculoskeletal   Abdominal   Peds  Hematology negative hematology ROS (+)   Anesthesia Other Findings Past Medical History: No date: Anxiety No date: Breast cancer, right breast (H[facility]) No date: Depression No date: H/O laparoscopy No date: History of hysteroscopy No date: Peripheral edema     Comment:  on lasix No date: PONV (postoperative nausea and vomiting) 10/30/14-12/17/14: Radiation     Comment:  right breast 50.4 gray  Past Surgical History: 05/16/14: BREAST BIOPSY 06/18/2014: BREAST LUMPECTOMY; Right No date: bunion surgrery 09/21/2016: CARPAL TUNNEL RELEASE; Left     Comment:  Procedure: LEFT CARPAL TUNNEL RELEASE;  Surgeon: Daryll Brod, MD;  Location: [place];  Service:              Orthopedics;  Laterality: Left;  REG/FAB No date: carpel tunnel 2015: CHOLECYSTECTOMY 05/22/2018: HYSTERECTOMY ABDOMINAL WITH SALPINGO-OOPHORECTOMY     Comment:  Procedure: HYSTERECTOMY ABDOMINAL WITH BILATERAL               SALPINGO-OOPHORECTOMY;  Surgeon: [name], MD;                Location: [place];  Service:               Gynecology;; No date: PLANTAR FASCIECTOMY 06/18/2014: PORTACATH PLACEMENT; Left     Comment:  Procedure: INSERTION PORT-A-CATH;  Surgeon: Excell Seltzer, MD;  Location: [place];                Service: General;  Laterality: Left; 05/22/2018: ROBOTIC ASSISTED LAPAROSCOPIC LYSIS OF ADHESION     Comment:  Procedure: XI ROBOTIC ASSISTED LAPAROSCOPIC LYSIS OF               ADHESION;  Surgeon: [name], MD;  Location:               [facility];  Service: Gynecology;; 05/22/2018: ROBOTIC ASSISTED TOTAL HYSTERECTOMY WITH BILATERAL SALPINGO  OOPHERECTOMY; Bilateral     Comment:  Procedure: ATTEMPTED XI ROBOTIC ASSISTED TOTAL               HYSTERECTOMY WITH BILATERAL SALPINGO OOPHORECTOMY;                Surgeon: [name], MD;  Location: [place];  Service: Gynecology;  Laterality:  Bilateral;  BMI    Body Mass Index: 44.93 kg/m      Reproductive/Obstetrics negative OB ROS                            Anesthesia Physical Anesthesia Plan  ASA: III  Anesthesia Plan: General   Post-op Pain Management:    Induction:   PONV Risk Score and Plan: Propofol infusion and TIVA  Airway Management Planned: Nasal Cannula  Additional Equipment:   Intra-op Plan:   Post-operative Plan:   Informed Consent: I have reviewed the patients History and Physical, chart, labs and discussed the procedure including the risks, benefits and alternatives for the proposed anesthesia with the patient or authorized representative who has indicated his/her understanding and acceptance.     Dental Advisory Given  Plan Discussed with: Anesthesiologist, CRNA and Surgeon  Anesthesia Plan Comments:         Anesthesia Quick Evaluation

## 2020-05-27 NOTE — Transfer of Care (Signed)
Immediate Anesthesia Transfer of Care Note  Patient: Krystal Kelley  Procedure(s) Performed: COLONOSCOPY WITH PROPOFOL (N/A )  Patient Location: Endoscopy Unit  Anesthesia Type:General  Level of Consciousness: awake, alert  and oriented  Airway & Oxygen Therapy: Patient Spontanous Breathing and Patient connected to nasal cannula oxygen  Post-op Assessment: Report given to RN and Post -op Vital signs reviewed and stable  Post vital signs: Reviewed and stable  Last Vitals:  Vitals Value Taken Time  BP 144/71 05/27/20 0858  Temp    Pulse 103 05/27/20 0858  Resp 24 05/27/20 0858  SpO2 99 % 05/27/20 0858    Last Pain:  Vitals:   05/27/20 0809  TempSrc: Temporal  PainSc: 0-No pain         Complications: No complications documented.

## 2020-05-27 NOTE — H&P (Signed)
Lucilla Lame, MD [facility] [address] [place], [place] [address] Phone: [phone] Fax : [phone]  Primary Care Physician:  Albina Billet, MD Primary Gastroenterologist:  Dr. Allen Norris  Pre-Procedure History & Physical: HPI:  Krystal Kelley is a 50 y.o. female is here for a screening colonoscopy.   Past Medical History:  Diagnosis Date  . Anxiety   . Breast cancer, right breast ([place])   . Depression   . H/O laparoscopy   . History of hysteroscopy   . Peripheral edema    on lasix  . PONV (postoperative nausea and vomiting)   . Radiation 10/30/14-12/17/14   right breast 50.4 gray    Past Surgical History:  Procedure Laterality Date  . BREAST BIOPSY  05/16/14  . BREAST LUMPECTOMY Right 06/18/2014  . bunion surgrery    . CARPAL TUNNEL RELEASE Left 09/21/2016   Procedure: LEFT CARPAL TUNNEL RELEASE;  Surgeon: Daryll Brod, MD;  Location: [place];  Service: Orthopedics;  Laterality: Left;  REG/FAB  . carpel tunnel    . CHOLECYSTECTOMY  2015  . HYSTERECTOMY ABDOMINAL WITH SALPINGO-OOPHORECTOMY  05/22/2018   Procedure: HYSTERECTOMY ABDOMINAL WITH BILATERAL SALPINGO-OOPHORECTOMY;  Surgeon: [name], MD;  Location: [place];  Service: Gynecology;;  . PLANTAR FASCIECTOMY    . PORTACATH PLACEMENT Left 06/18/2014   Procedure: INSERTION PORT-A-CATH;  Surgeon: Excell Seltzer, MD;  Location: [place];  Service: General;  Laterality: Left;  . ROBOTIC ASSISTED LAPAROSCOPIC LYSIS OF ADHESION  05/22/2018   Procedure: XI ROBOTIC ASSISTED LAPAROSCOPIC LYSIS OF ADHESION;  Surgeon: [name], MD;  Location: [place];  Service: Gynecology;;  . ROBOTIC ASSISTED TOTAL HYSTERECTOMY WITH BILATERAL SALPINGO OOPHERECTOMY Bilateral 05/22/2018   Procedure: ATTEMPTED XI ROBOTIC ASSISTED TOTAL HYSTERECTOMY WITH BILATERAL SALPINGO OOPHORECTOMY;  Surgeon: [name], MD;  Location: [place];   Service: Gynecology;  Laterality: Bilateral;    Prior to Admission medications   Medication Sig Start Date End Date Taking? Authorizing Provider  anastrozole (ARIMIDEX) 1 MG tablet Take 1 tablet (1 mg total) by mouth daily. 04/17/19  Yes Nicholas Lose, MD  Calcium Carbonate (CALCIUM 500 PO) Take by mouth.   Yes [provider]  cycloSPORINE (RESTASIS) 0.05 % ophthalmic emulsion Place 1 drop into both eyes 2 (two) times daily.   Yes [provider]  furosemide (LASIX) 20 MG tablet TAKE 3 TABLETS(60 MG) BY MOUTH DAILY Patient taking differently: Take 20 mg by mouth 3 (three) times daily. 01/10/18  Yes Nicholas Lose, MD  ketoconazole (NIZORAL) 2 % cream Apply 1 application topically daily as needed for irritation (irritated/itchy skin.).    Yes [provider]  melatonin 5 MG TABS Take 5 mg by mouth.   Yes [provider]  phentermine 37.5 MG capsule Take 1 capsule (37.5 mg total) by mouth every morning. 03/20/20  Yes [name], MD  vitamin C (ASCORBIC ACID) 500 MG tablet Take 500 mg by mouth daily.    Yes [provider]  Zinc Sulfate (ZINC 15 PO) Take by mouth.   Yes [provider]  acetaminophen (TYLENOL) 500 MG tablet Take 500-1,000 mg by mouth every 6 (six) hours as needed (pain.).     [provider]  brompheniramine-pseudoephedrine-DM 30-2-10 MG/5ML syrup Take 5 mLs by mouth 4 (four) times daily as needed. 04/18/20   Sable Feil, PA-C  Eluxadoline (VIBERZI) 100 MG TABS Take 1 tablet (100 mg total) by mouth 2 (two) times daily. 01/18/17  Lucilla Lame, MD  EUCRISA 2 % OINT  01/04/19   [provider]  Flaxseed, Linseed, (FLAXSEED OIL) 1000 MG CAPS Take 1,000 mg by mouth daily.    [provider]  fluticasone (FLONASE) 50 MCG/ACT nasal spray Place 1 spray into both nostrils daily as needed for allergies.    [provider]  meloxicam (MOBIC) 15 MG tablet Take 15 mg by mouth daily. Taking  occasionally as needed 02/12/20   [provider]  Multiple Vitamin (MULTIVITAMIN WITH MINERALS) TABS tablet Take 1 tablet by mouth daily. One-A-Day Multivitamin     [provider]  valACYclovir (VALTREX) 500 MG tablet Take 500 mg by mouth 2 (two) times daily as needed (outbreaks.).     [provider]    Allergies as of 03/18/2020  . (No Known Allergies)    Family History  Problem Relation Age of Onset  . Kidney cancer Father        kidney  . Prostate cancer Maternal Uncle        deceased 40  . Lung cancer Maternal Grandfather        deceased 8; smoker  . Colon cancer Paternal Grandfather        deceased 45s  . Cancer Other        pat half-sister; ? uterine vs. ovarian ca    Social History   Socioeconomic History  . Marital status: Divorced    Spouse name: Not on file  . Number of children: 0  . Years of education: Not on file  . Highest education level: Not on file  Occupational History  . Occupation: accounts receivable at [employer]  . Smoking status: Never Smoker  . Smokeless tobacco: Never Used  Vaping Use  . Vaping Use: Never used  Substance and Sexual Activity  . Alcohol use: Yes    Comment: wine- occ   . Drug use: No  . Sexual activity: Not Currently    Birth control/protection: None    Comment: 1st intercourse- 16, partners- 90,   Other Topics Concern  . Not on file  Social History Narrative  . Not on file   Social Determinants of Health   Financial Resource Strain: Not on file  Food Insecurity: Not on file  Transportation Needs: Not on file  Physical Activity: Not on file  Stress: Not on file  Social Connections: Not on file  Intimate Partner Violence: Not on file    Review of Systems: See HPI, otherwise negative ROS  Physical Exam: BP (!) 157/114   Pulse (!) 102   Temp (!) 96.6 F (35.9 C) (Temporal)   Resp (!) 22   Ht 5\' 5"  (1.651 m)   Wt 122.5 kg   LMP  (LMP Unknown)   SpO2 100%   BMI 44.93  kg/m  General:   Alert,  pleasant and cooperative in NAD Head:  Normocephalic and atraumatic. Neck:  Supple; no masses or thyromegaly. Lungs:  Clear throughout to auscultation.    Heart:  Regular rate and rhythm. Abdomen:  Soft, nontender and nondistended. Normal bowel sounds, without guarding, and without rebound.   Neurologic:  Alert and  oriented x4;  grossly normal neurologically.  Impression/Plan: Krystal Kelley is now here to undergo a screening colonoscopy.  Risks, benefits, and alternatives regarding colonoscopy have been reviewed with the patient.  Questions have been answered.  All parties agreeable.

## 2020-05-28 ENCOUNTER — Encounter: Payer: Self-pay | Admitting: Gastroenterology

## 2020-05-28 ENCOUNTER — Other Ambulatory Visit: Payer: Self-pay

## 2020-05-28 ENCOUNTER — Ambulatory Visit (INDEPENDENT_AMBULATORY_CARE_PROVIDER_SITE_OTHER): Payer: Medicare Other | Admitting: Dermatology

## 2020-05-28 ENCOUNTER — Ambulatory Visit (INDEPENDENT_AMBULATORY_CARE_PROVIDER_SITE_OTHER): Payer: Medicare Other | Admitting: Podiatry

## 2020-05-28 DIAGNOSIS — L219 Seborrheic dermatitis, unspecified: Secondary | ICD-10-CM

## 2020-05-28 DIAGNOSIS — L819 Disorder of pigmentation, unspecified: Secondary | ICD-10-CM

## 2020-05-28 DIAGNOSIS — M722 Plantar fascial fibromatosis: Secondary | ICD-10-CM | POA: Diagnosis not present

## 2020-05-28 DIAGNOSIS — B360 Pityriasis versicolor: Secondary | ICD-10-CM

## 2020-05-28 MED ORDER — MOMETASONE FUROATE 0.1 % EX CREA
1.0000 | TOPICAL_CREAM | Freq: Every day | CUTANEOUS | 6 refills | Status: DC | PRN
Start: 2020-05-28 — End: 2022-08-17

## 2020-05-28 MED ORDER — TRIAMCINOLONE ACETONIDE 40 MG/ML IJ SUSP
40.0000 mg | Freq: Once | INTRAMUSCULAR | Status: AC
Start: 2020-05-28 — End: 2020-05-28
  Administered 2020-05-28: 40 mg

## 2020-05-28 MED ORDER — KETOCONAZOLE 2 % EX CREA: 1.0000 "application " | TOPICAL_CREAM | Freq: Every day | CUTANEOUS | 6 refills | Status: DC | PRN

## 2020-05-28 NOTE — Progress Notes (Signed)
She presents today for follow-up of her plantar fasciitis bilateral states her right knee has been bothering her so her left foot is really giving her feet.  Objective: Vital signs are stable she alert oriented x3 pulses are strong and palpable. Still has severe pain on palpation medial calcaneal tubercle bilateral.  Assessment: Fasciitis bilateral.  Plan: Reinjected bilateral heels today 20 mg Kenalog 5 mg Marcaine after Betadine skin prep. Tolerated procedure well without complications follow-up with her in a couple of months.

## 2020-05-28 NOTE — Progress Notes (Signed)
   Follow-Up Visit   Subjective  Krystal Kelley is a 50 y.o. female who presents for the following: Rash (Recheck tinea versicolor of the face, treating with Ketoconazole cream and mometasone cream ).  The following portions of the chart were reviewed this encounter and updated as appropriate:   Tobacco  Allergies  Meds  Problems  Med Hx  Surg Hx  Fam Hx      Review of Systems:  No other skin or systemic complaints except as noted in HPI or Assessment and Plan.  Objective  Well appearing patient in no apparent distress; mood and affect are within normal limits.  A focused examination was performed including face,neck . Relevant physical exam findings are noted in the Assessment and Plan.  Objective  face,neck: Slight pinkness on the R brow Hypopigmented patch on the L cheek ~1.5 x 1.0 cm  Hyperpigmented patch on the R neck ~ 1.0 cm     Assessment & Plan  Tinea versicolor and seborrheic dermatitis with pruritus and dyschromia face,neck Tinea Versicolor/Seborrheic dermatitis with pruritus   Seborrheic Dermatitis  -  is a chronic persistent rash characterized by pinkness and scaling most commonly of the mid face but also can occur on the scalp (dandruff), ears; mid chest and mid back. It tends to be exacerbated by stress and cooler weather.  People who have neurologic disease may experience new onset or exacerbation of existing seborrheic dermatitis.  The condition is not curable but treatable and can be controlled.   Cont Mometasone cream  Cont Ketoconazole 2% cream   Ordered Medications: mometasone (ELOCON) 0.1 % cream  Reordered Medications ketoconazole (NIZORAL) 2 % cream  Return in about 6 months (around 11/25/2020) for tinea versicolor/seb derm.  IMarye Round, CMA, am acting as scribe for Sarina Ser, MD .  Documentation: I have reviewed the above documentation for accuracy and completeness, and I agree with the above.  Sarina Ser, MD

## 2020-05-28 NOTE — Anesthesia Postprocedure Evaluation (Signed)
Anesthesia Post Note  Patient: Krystal Kelley  Procedure(s) Performed: COLONOSCOPY WITH PROPOFOL (N/A )  Patient location during evaluation: PACU Anesthesia Type: General Level of consciousness: awake and alert Pain management: pain level controlled Vital Signs Assessment: post-procedure vital signs reviewed and stable Respiratory status: spontaneous breathing, nonlabored ventilation and respiratory function stable Cardiovascular status: blood pressure returned to baseline and stable Postop Assessment: no apparent nausea or vomiting Anesthetic complications: no   No complications documented.   Last Vitals:  Vitals:   05/27/20 0918 05/27/20 0928  BP: (!) 170/99 (!) 175/99  Pulse: 87 81  Resp: 16 14  Temp:    SpO2: 100% 100%    Last Pain:  Vitals:   05/27/20 0918  TempSrc:   PainSc: 0-No pain                 Tera Mater

## 2020-05-29 ENCOUNTER — Other Ambulatory Visit: Payer: Self-pay | Admitting: Hematology and Oncology

## 2020-06-01 ENCOUNTER — Encounter: Payer: Self-pay | Admitting: Dermatology

## 2020-06-01 NOTE — Assessment & Plan Note (Signed)
Right breast lumpectomy 06/18/2014: Invasive ductal carcinoma 1.3 cm negative for LVI; DCIS 1 mm from margins, 1 sentinel node negative, grade 3 ER 95%, PR 77%, HER-2 amplified ratio 8.25, Ki-67 43% T1 cN0 M0 stage IA  Treatment summary S/P Adjuvant chemo with Abraxane Herceptin started 07/18/2014 completed 10/03/14, Herceptin maintenance completed 07/03/2015 Status post adjuvant radiation completed 12/17/2014 Started tamoxifen09/2016 ---------------------------------------------------------------------------- Current treatment:Tamoxifen 20 mg daily09/2016switched to anastrozole 09/12/2018 since she had hysterectomy  Anastrozole toxicities:Occ hot flashes  Breast cancer surveillance: 1.Breast exam 04/17/2019: Benign  2.mammogram 09/18/19 at [facility]: Benign with breast density categoryA 12/30/2017: CT abdomen pelvis for left-sided abdominal pain: Fatty liver, uterine fibroid  Obesity:Gained weight because she is not watching her diet.  I instructed her that this should be her goal for 2021.   Return to clinic in 1 year for follow-up

## 2020-06-01 NOTE — Progress Notes (Signed)
Patient Care Team: Albina Billet, MD as PCP - General (Internal Medicine) Albina Billet, MD (Internal Medicine) Nicholas Lose, MD as Consulting Physician (Hematology and Oncology) Excell Seltzer, MD (Inactive) as Consulting Physician (General Surgery) Gery Pray, MD as Consulting Physician (Radiation Oncology) Gardenia Phlegm, NP as Nurse Practitioner (Hematology and Oncology)  DIAGNOSIS:    ICD-10-CM   1. Malignant neoplasm of lower-inner quadrant of right breast of female, estrogen receptor positive ([place])  C50.311    Z17.0     SUMMARY OF ONCOLOGIC HISTORY: Oncology History  Breast cancer of lower-inner quadrant of right female breast ([place])  05/14/2014 Mammogram   Right mammogram: 1.5 cm round mass in right breast at 3:00   05/14/2014 Breast US   Right breast: irregular, hypoechoic mass with indistinct margins at 3:00 mearying 1.1 x 0.8 x 1.3 cm.   05/16/2014 Initial Biopsy   Ultrasound-guided biopsy: Invasive ductal carcinoma with DCIS, grade 1-2, ER/PR positive, Ki-67 43%, HER-2 positive   05/28/2014 Breast MRI   1.4 x 1.2 x 1.0 cm biopsy-proven invasive ductal carcinoma and ductal carcinoma in situ in the lower inner quadrant of the right breast.    05/28/2014 Clinical Stage   Stage IA: T1c N0   06/13/2014 Procedure   Breast/Next: Variant of Unknown Significance in the ATM gene called p.[id number]>T, p.[id number]. Otherwise, no clinically significant mutation at ATM, BARD1, BRCA1, BRCA2, BRIP1, CDH1, CHEK2, MRE11A, MUTYH, NBN, NF1, PALB2, PTEN, RAD50, RAD51C, RAD51D, and TP53   06/18/2014 Surgery   Right breast lumpectomy: Invasive ductal carcinoma 1.3 cm negative for LVI; DCIS 1 mm from margins, 1 sentinel node negative, grade 3 ER 95%, PR 77%, HER-2 amplified ratio 8.25, Ki-67 43%   06/18/2014 Pathologic Stage   Stage IA: T1c N0 M0   07/18/2014 - 10/03/2014 Chemotherapy   Abraxane Herceptin weekly 12 followed by Herceptin maintenance every 3 weeks to complete one year  completed 07/03/2015   10/30/2014 - 12/17/2014 Radiation Therapy   Adjuvant radiation therapy: Right breast 50.4 gray in 28 fractions, lumpectomy cavity boost 12 gray in 6 fractions   02/01/2015 -  Anti-estrogen oral therapy   Tamoxifen 20 mg daily, switched to anastrozole on 09/12/18 after hysterectomy      CHIEF COMPLIANT: Follow-up of right breast cancer onanastrozole  INTERVAL HISTORY: Krystal Kelley is a 50 y.o. with above-mentioned history of right breast cancer treated with lumpectomy,adjuvant chemotherapy with Herceptin, andradiation whois currently onanastrozole.Mammogram on 09/18/19 showed no evidence of malignancy bilaterally. She presents to the clinic today for follow-up. C/O diffuse pain in joints due to Anti est therapy. Lost 36 lbs using slim fast  ALLERGIES:  has No Known Allergies.  MEDICATIONS:  Current Outpatient Medications  Medication Sig Dispense Refill  . acetaminophen (TYLENOL) 500 MG tablet Take 500-1,000 mg by mouth every 6 (six) hours as needed (pain.).     [name] anastrozole (ARIMIDEX) 1 MG tablet TAKE 1 TABLET(1 MG) BY MOUTH DAILY 90 tablet 0  . brompheniramine-pseudoephedrine-DM 30-2-10 MG/5ML syrup Take 5 mLs by mouth 4 (four) times daily as needed. 120 mL 0  . Calcium Carbonate (CALCIUM 500 PO) Take by mouth.    . cycloSPORINE (RESTASIS) 0.05 % ophthalmic emulsion Place 1 drop into both eyes 2 (two) times daily.    . Eluxadoline (VIBERZI) 100 MG TABS Take 1 tablet (100 mg total) by mouth 2 (two) times daily. 60 tablet 5  . EUCRISA 2 % OINT     . Flaxseed, Linseed, (FLAXSEED OIL) 1000 MG CAPS Take 1,000  mg by mouth daily.    . fluticasone (FLONASE) 50 MCG/ACT nasal spray Place 1 spray into both nostrils daily as needed for allergies.    . furosemide (LASIX) 20 MG tablet TAKE 3 TABLETS(60 MG) BY MOUTH DAILY (Patient taking differently: Take 20 mg by mouth 3 (three) times daily.) 90 tablet 0  . ketoconazole (NIZORAL) 2 % cream Apply 1 application topically  daily as needed for irritation (irritated/itchy skin.). 60 g 6  . melatonin 5 MG TABS Take 5 mg by mouth.    . meloxicam (MOBIC) 15 MG tablet Take 15 mg by mouth daily. Taking occasionally as needed    . mometasone (ELOCON) 0.1 % cream Apply 1 application topically daily as needed (Rash). 45 g 6  . Multiple Vitamin (MULTIVITAMIN WITH MINERALS) TABS tablet Take 1 tablet by mouth daily. One-A-Day Multivitamin     . phentermine 37.5 MG capsule Take 1 capsule (37.5 mg total) by mouth every morning. 30 capsule 2  . valACYclovir (VALTREX) 500 MG tablet Take 500 mg by mouth 2 (two) times daily as needed (outbreaks.).     [name] vitamin C (ASCORBIC ACID) 500 MG tablet Take 500 mg by mouth daily.     . Zinc Sulfate (ZINC 15 PO) Take by mouth.     No current facility-administered medications for this visit.    PHYSICAL EXAMINATION: ECOG PERFORMANCE STATUS: 1 - Symptomatic but completely ambulatory  There were no vitals filed for this visit. There were no vitals filed for this visit.  BREAST: No palpable masses or nodules in either right or left breasts. No palpable axillary supraclavicular or infraclavicular adenopathy no breast tenderness or nipple discharge. (exam performed in the presence of a chaperone)  LABORATORY DATA:  I have reviewed the data as listed CMP Latest Ref Rng & Units 02/29/2020 01/10/2019 01/06/2018  Glucose 65 - 99 mg/dL 115(H) 96 97  BUN 7 - 25 mg/dL _0 Creatinine 0.50 - 1.05 mg/dL 0.84 1.02 0.89  Sodium 135 - 146 mmol/L 139 141 140  Potassium 3.5 - 5.3 mmol/L 4.2 3.6 3.9  Chloride 98 - 110 mmol/L 103 101 105  CO2 20 - 32 mmol/L _1 Calcium 8.6 - 10.4 mg/dL 10.0 9.5 9.4  Total Protein 6.1 - 8.1 g/dL 7.4 7.8 6.8  Total Bilirubin 0.2 - 1.2 mg/dL 0.4 0.4 0.3  Alkaline Phos 38 - 126 U/L - - -  AST 10 - 35 U/L 20 38(H) 24  ALT 6 - 29 U/L 30(H) 38(H) 24    Lab Results  Component Value Date   WBC 7.9 03/20/2020   HGB 12.8 03/20/2020   HCT 37.9 03/20/2020   MCV  92.0 03/20/2020   PLT 346 03/20/2020   NEUTROABS 4,716 03/20/2020    ASSESSMENT & PLAN:  Breast cancer of lower-inner quadrant of right female breast Right breast lumpectomy 06/18/2014: Invasive ductal carcinoma 1.3 cm negative for LVI; DCIS 1 mm from margins, 1 sentinel node negative, grade 3 ER 95%, PR 77%, HER-2 amplified ratio 8.25, Ki-67 43% T1 cN0 M0 stage IA  Treatment summary S/P Adjuvant chemo with Abraxane Herceptin started 07/18/2014 completed 10/03/14, Herceptin maintenance completed 07/03/2015 Status post adjuvant radiation completed 12/17/2014 Started tamoxifen09/2016 ---------------------------------------------------------------------------- Current treatment:Tamoxifen 20 mg daily09/2016switched to anastrozole 09/12/2018 since she had hysterectomy To be done Oct 2023  Anastrozole toxicities: Occ hot flashes Joint aches  Breast cancer surveillance: 1.Breast exam 04/17/2019: Benign  2.mammogram 09/18/19 at [facility]: Benign with breast density categoryA 12/30/2017: CT abdomen pelvis  for left-sided abdominal pain: Fatty liver, uterine fibroid  Obesity:lost 35lbs. (on Slim fast) Mostly by diet.  Return to clinic in 1 year for follow-up    No orders of the defined types were placed in this encounter.  The patient has a good understanding of the overall plan. she agrees with it. she will call with any problems that may develop before the next visit here.  Total time spent: 20 mins including face to face time and time spent for planning, charting and coordination of care  Rulon Eisenmenger, MD, MPH 06/02/2020  I, Molly Dorshimer, am acting as scribe for Dr. Nicholas Lose.  I have reviewed the above documentation for accuracy and completeness, and I agree with the above.

## 2020-06-02 ENCOUNTER — Inpatient Hospital Stay: Payer: Medicare Other | Attending: Hematology and Oncology | Admitting: Hematology and Oncology

## 2020-06-02 ENCOUNTER — Other Ambulatory Visit: Payer: Self-pay

## 2020-06-02 DIAGNOSIS — Z170 Estrogen receptor positive status [ER+]: Secondary | ICD-10-CM | POA: Insufficient documentation

## 2020-06-02 DIAGNOSIS — C50311 Malignant neoplasm of lower-inner quadrant of right female breast: Secondary | ICD-10-CM | POA: Diagnosis present

## 2020-06-02 DIAGNOSIS — Z79899 Other long term (current) drug therapy: Secondary | ICD-10-CM | POA: Insufficient documentation

## 2020-06-02 DIAGNOSIS — Z79811 Long term (current) use of aromatase inhibitors: Secondary | ICD-10-CM | POA: Insufficient documentation

## 2020-06-02 DIAGNOSIS — E669 Obesity, unspecified: Secondary | ICD-10-CM | POA: Insufficient documentation

## 2020-06-02 DIAGNOSIS — Z9221 Personal history of antineoplastic chemotherapy: Secondary | ICD-10-CM | POA: Insufficient documentation

## 2020-06-02 DIAGNOSIS — Z923 Personal history of irradiation: Secondary | ICD-10-CM | POA: Insufficient documentation

## 2020-06-02 MED ORDER — ANASTROZOLE 1 MG PO TABS: ORAL_TABLET | ORAL | 3 refills | Status: DC

## 2020-06-17 ENCOUNTER — Other Ambulatory Visit: Payer: Self-pay

## 2020-06-17 NOTE — Telephone Encounter (Signed)
Patient received Rx for Phentermine 37.5mg  #30 x 2 refills on 03/20/21.  She called today requesting a refill stating she will take a break and start this one in a few weeks.  She said she has lost almost 40 pounds and is on track.

## 2020-06-20 NOTE — Telephone Encounter (Signed)
If normal BP, can prescribe Phentermine 37.5 mg daily #30, refill x 2.  If no recent BP, take it at pharmacy and report to [place] or come here to check.

## 2020-06-20 NOTE — Telephone Encounter (Signed)
Patient informed she will have blood checked at pharmacy and report number to office.

## 2020-07-03 ENCOUNTER — Telehealth: Payer: Self-pay

## 2020-07-03 NOTE — Telephone Encounter (Signed)
Patient called in voice mail stating she had her BP checked four times since we talked on 06/17/20 and it was a good result each time.  She asked me to call her back for the numbers. I called her back but her voice mailbox is full.

## 2020-07-03 NOTE — Telephone Encounter (Addendum)
I actually did addendum on 06/17/20 telephone encounter.

## 2020-07-09 NOTE — Telephone Encounter (Signed)
Patient called stating her BP numbers have been good.  She said she checked it this morning and it was 135/80.  She is calling for Phentermine refill.

## 2020-07-09 NOTE — Addendum Note (Signed)
Addended by: Ramond Craver on: 07/09/2020 11:27 AM   Modules accepted: Orders

## 2020-07-11 ENCOUNTER — Telehealth: Payer: Self-pay | Admitting: *Deleted

## 2020-07-11 MED ORDER — PHENTERMINE HCL 37.5 MG PO CAPS: 37.5000 mg | ORAL_CAPSULE | ORAL | 2 refills | Status: DC

## 2020-07-11 NOTE — Telephone Encounter (Signed)
Pt called with last Blood pressure reading 145/80  taken 07/09/2020. Pt would like to know if Dr. Dellis Filbert would prescribe Phentermine. Will route to Provider for approval/denial or recommendations.

## 2020-07-11 NOTE — Telephone Encounter (Signed)
Rx called into [place], [place] [place]

## 2020-07-11 NOTE — Telephone Encounter (Signed)
Rx called in 

## 2020-07-14 ENCOUNTER — Ambulatory Visit (INDEPENDENT_AMBULATORY_CARE_PROVIDER_SITE_OTHER): Payer: Medicare Other | Admitting: Podiatry

## 2020-07-14 ENCOUNTER — Other Ambulatory Visit: Payer: Self-pay

## 2020-07-14 DIAGNOSIS — M722 Plantar fascial fibromatosis: Secondary | ICD-10-CM

## 2020-07-14 MED ORDER — TRIAMCINOLONE ACETONIDE 40 MG/ML IJ SUSP
40.0000 mg | Freq: Once | INTRAMUSCULAR | Status: AC
Start: 2020-07-14 — End: 2020-07-14
  Administered 2020-07-14: 40 mg

## 2020-07-14 NOTE — Telephone Encounter (Signed)
Pt aware medication is at pharmacy

## 2020-07-14 NOTE — Progress Notes (Signed)
She presents today chief complaint of a painful left heel.  Objective: Vital signs are stable alert oriented x3.  There is no erythematous moderate edema no cellulitis drainage or odor she has pain to palpation Nupercaine tubercle of left heel none on the right.  No pain on medial lateral compression of the calcaneus.  Assessment: Plantar fasciitis right.  I injected 10 mg Kenalog 5 mg Marcaine point maximal tenderness today.  Follow-up with her in 2 months

## 2020-08-25 ENCOUNTER — Other Ambulatory Visit: Payer: Self-pay

## 2020-08-25 ENCOUNTER — Ambulatory Visit (INDEPENDENT_AMBULATORY_CARE_PROVIDER_SITE_OTHER): Payer: Medicare Other | Admitting: Podiatry

## 2020-08-25 ENCOUNTER — Encounter: Payer: Self-pay | Admitting: Podiatry

## 2020-08-25 DIAGNOSIS — M722 Plantar fascial fibromatosis: Secondary | ICD-10-CM

## 2020-08-25 DIAGNOSIS — L600 Ingrowing nail: Secondary | ICD-10-CM | POA: Diagnosis not present

## 2020-08-25 MED ORDER — NEOMYCIN-POLYMYXIN-HC 1 % OT SOLN: OTIC | 1 refills | Status: DC

## 2020-08-25 MED ORDER — TRIAMCINOLONE ACETONIDE 40 MG/ML IJ SUSP
20.0000 mg | Freq: Once | INTRAMUSCULAR | Status: AC
  Administered 2020-08-25: 20 mg

## 2020-08-25 NOTE — Patient Instructions (Signed)

## 2020-08-25 NOTE — Progress Notes (Signed)
She presents today chief complaint of painful ingrown toenail tibial border hallux left.  She is also complaining of painful plantar fasciitis.  States that she has been working on this for a couple of days by herself states that she is got most of the corn around but she cannot reach the deep part.  Objective: Vital signs are stable she alert and oriented x3.  Pulses are palpable.  There is mild erythema no edema cellulitis drainage or odor.  She has pain on palpation medial calcaneal tubercle.  Assessment: Pain in limb with Planter fasciitis left pain in limb with a hallux ingrown nail tibial border left.  Plan: Discussed etiology pathology conservative versus surgical therapies at this point I injected her left heel with 10 mg Kenalog 5 mg Marcaine I also performed a chemical matricectomy to the tibial border of the hallux left discussed pros and cons of this with her she understands and is amendable to it.  She tolerated procedure well after local anesthetic was administered she received both oral written home-going structure for the care and soaking of the toe as well as a prescription for Cortisporin Otic to be applied twice daily after soaking.  I will follow-up with her in a couple of weeks for reevaluation.

## 2020-09-04 ENCOUNTER — Other Ambulatory Visit: Payer: Self-pay | Admitting: Hematology and Oncology

## 2020-09-10 ENCOUNTER — Encounter: Payer: Self-pay | Admitting: Hematology and Oncology

## 2020-09-18 ENCOUNTER — Encounter: Payer: Self-pay | Admitting: Hematology and Oncology

## 2020-09-22 ENCOUNTER — Ambulatory Visit (INDEPENDENT_AMBULATORY_CARE_PROVIDER_SITE_OTHER): Payer: Medicare Other | Admitting: Podiatry

## 2020-09-22 ENCOUNTER — Other Ambulatory Visit: Payer: Self-pay

## 2020-09-22 ENCOUNTER — Encounter: Payer: Self-pay | Admitting: Podiatry

## 2020-09-22 DIAGNOSIS — L600 Ingrowing nail: Secondary | ICD-10-CM | POA: Diagnosis not present

## 2020-09-22 NOTE — Progress Notes (Signed)
She presents today for follow-up of her matrixectomy hallux left.  She states that the third toe on the right foot is been bothering me but I think Taking care of it was really sore when I called to make the appointment but otherwise it seems to be doing well.  She states that her Planter fasciitis is doing pretty well.  Objective: Vital signs stable alert oriented x3 there is no erythema edema cellulitis drainage or odor to the left foot.  Right foot does demonstrate some tenderness lateral border of the third digit right foot along the nail.  No signs of infection.  No pain on palpation medial calcaneal tubercle.   Assessment: I trimmed the nail border out for her today may need to consider matrixectomy at some point time.  Plan: Discussed etiology pathology conservative surgical therapies follow-up with her in 1 month.

## 2020-10-09 NOTE — Telephone Encounter (Signed)
Was prescribed 03/20/20 #30 x 2 refills. Was prescribed 07/11/20 #30 x 2 refills.

## 2020-10-10 ENCOUNTER — Other Ambulatory Visit: Payer: Self-pay | Admitting: Obstetrics & Gynecology

## 2020-10-10 MED ORDER — PHENTERMINE HCL 37.5 MG PO CAPS: 37.5000 mg | ORAL_CAPSULE | ORAL | 2 refills | Status: DC

## 2020-10-15 ENCOUNTER — Telehealth: Payer: Self-pay

## 2020-10-15 NOTE — Telephone Encounter (Signed)
Dr. Dellis Filbert prescribed Phentermine refill but sent message  "Please ask patient to send her most recent BP readings and exact weight."  I spoke with patient today because my My Chart message reply to her was returned unread.  She agreed to check with Dr. Hall Busing and get her last several BP readings and send to [place].

## 2020-10-22 ENCOUNTER — Ambulatory Visit (INDEPENDENT_AMBULATORY_CARE_PROVIDER_SITE_OTHER): Payer: Medicare Other | Admitting: Podiatry

## 2020-10-22 ENCOUNTER — Encounter: Payer: Self-pay | Admitting: Podiatry

## 2020-10-22 ENCOUNTER — Other Ambulatory Visit: Payer: Self-pay

## 2020-10-22 VITALS — BP 130/79 | HR 101

## 2020-10-22 DIAGNOSIS — M722 Plantar fascial fibromatosis: Secondary | ICD-10-CM | POA: Diagnosis not present

## 2020-10-22 NOTE — Progress Notes (Signed)
She presents today concerned about her toenails and to whether or not they are ingrown she is also concerned about her Planter fasciitis.  Objective: Vital signs are stable alert and oriented x3 I see no erythema edema cellulitis drainage or odor no sharp incurvated nail margins.  She has no reproducible pain on palpation medial calcaneal tubercles bilateral.  Assessment: Plan fasciitis has resolved as well as ingrown's.  Plan: Follow-up with me on an as-needed basis or in 1 month.

## 2020-11-17 ENCOUNTER — Ambulatory Visit: Payer: Medicare Other | Admitting: Podiatry

## 2020-11-26 ENCOUNTER — Ambulatory Visit: Payer: Self-pay | Admitting: Dermatology

## 2020-12-01 ENCOUNTER — Ambulatory Visit (INDEPENDENT_AMBULATORY_CARE_PROVIDER_SITE_OTHER): Payer: Medicare Other | Admitting: Podiatry

## 2020-12-01 ENCOUNTER — Other Ambulatory Visit: Payer: Self-pay

## 2020-12-01 ENCOUNTER — Encounter: Payer: Self-pay | Admitting: Podiatry

## 2020-12-01 DIAGNOSIS — M722 Plantar fascial fibromatosis: Secondary | ICD-10-CM | POA: Diagnosis not present

## 2020-12-01 MED ORDER — TRIAMCINOLONE ACETONIDE 40 MG/ML IJ SUSP
20.0000 mg | Freq: Once | INTRAMUSCULAR | Status: AC
Start: 2020-12-01 — End: 2020-12-01
  Administered 2020-12-01: 20 mg

## 2020-12-01 NOTE — Progress Notes (Signed)
She presents today chief complaint of painful right heel.  Objective: Vital signs stable alert oriented x3.  There is no erythema edema/drainage odor she has mild tenderness on palpation medial calcaneal tubercle.  Assessment chronic intractable Planter fasciitis.  Plan: Reinjected the area today with dexamethasone local anesthetic follow-up with her on an as-needed basis or in 1 month.

## 2021-01-02 ENCOUNTER — Other Ambulatory Visit: Payer: Self-pay | Admitting: Orthopedic Surgery

## 2021-01-07 ENCOUNTER — Other Ambulatory Visit: Payer: Self-pay

## 2021-01-07 ENCOUNTER — Ambulatory Visit (INDEPENDENT_AMBULATORY_CARE_PROVIDER_SITE_OTHER): Payer: Medicare Other | Admitting: Podiatry

## 2021-01-07 ENCOUNTER — Encounter: Payer: Self-pay | Admitting: Podiatry

## 2021-01-07 DIAGNOSIS — M722 Plantar fascial fibromatosis: Secondary | ICD-10-CM | POA: Diagnosis not present

## 2021-01-07 DIAGNOSIS — R600 Localized edema: Secondary | ICD-10-CM

## 2021-01-07 NOTE — Progress Notes (Signed)
She presents today for follow-up of her chronic proximal Planter fasciitis bilaterally.  She states that it really has not been bothering her very much but she is done a lot of walking and she says my legs are swollen.  She denies any trauma she denies chest pain or shortness of breath.  Objective: Vital signs are stable she is alert oriented x3 has pitting edema bilateral lower extremity though minor is mildly tender on palpation.  She has no pain on palpation medial calcaneal tubercles.  She has no calf pain bilateral.  Assessment: Currently no Planter fasciitis to speak of but I am concerned about the sudden edema bilaterally.  Plan: I encouraged her to take her fluid pill which she has at home but does not take regularly.  If this did not alleviate swelling I instructed her to call her primary care provider.

## 2021-02-17 ENCOUNTER — Ambulatory Visit (HOSPITAL_BASED_OUTPATIENT_CLINIC_OR_DEPARTMENT_OTHER): Admit: 2021-02-17 | Payer: Medicare Other | Admitting: Orthopedic Surgery

## 2021-02-17 ENCOUNTER — Encounter (HOSPITAL_BASED_OUTPATIENT_CLINIC_OR_DEPARTMENT_OTHER): Payer: Self-pay

## 2021-02-17 SURGERY — RELEASE, A1 PULLEY, FOR TRIGGER FINGER
Anesthesia: Regional | Laterality: Left

## 2021-02-18 ENCOUNTER — Ambulatory Visit: Payer: Medicare Other | Admitting: Podiatry

## 2021-02-25 ENCOUNTER — Encounter: Payer: Self-pay | Admitting: Podiatry

## 2021-02-25 ENCOUNTER — Ambulatory Visit (INDEPENDENT_AMBULATORY_CARE_PROVIDER_SITE_OTHER): Payer: Medicare Other | Admitting: Podiatry

## 2021-02-25 ENCOUNTER — Other Ambulatory Visit: Payer: Self-pay

## 2021-02-25 DIAGNOSIS — M722 Plantar fascial fibromatosis: Secondary | ICD-10-CM | POA: Diagnosis not present

## 2021-02-25 MED ORDER — TRIAMCINOLONE ACETONIDE 40 MG/ML IJ SUSP
40.0000 mg | Freq: Once | INTRAMUSCULAR | Status: AC
  Administered 2021-02-25: 40 mg

## 2021-02-25 NOTE — Progress Notes (Signed)
She presents today for follow-up of her bilateral Planter fasciitis stated that both her feet are hurting today.  Objective: Vital signs are stable alert and oriented x3 there is no erythema edema cellulitis drainage or odor.  She has pain to palpation MucoClear tubercles left greater than right.  No pain medial lateral compression of the calcaneus.  Assessment: Planter fasciitis bilateral left greater than right.  Plan: Injected 10 mg Kenalog 5 mg Marcaine divided about each foot to the bilateral heel.  Follow-up with her in 1 month or as needed.

## 2021-03-05 ENCOUNTER — Telehealth: Payer: Self-pay

## 2021-03-05 ENCOUNTER — Encounter: Payer: Self-pay | Admitting: Hematology and Oncology

## 2021-03-05 ENCOUNTER — Other Ambulatory Visit: Payer: Self-pay

## 2021-03-05 ENCOUNTER — Encounter: Payer: Self-pay | Admitting: Obstetrics & Gynecology

## 2021-03-05 ENCOUNTER — Ambulatory Visit (INDEPENDENT_AMBULATORY_CARE_PROVIDER_SITE_OTHER): Payer: Medicare Other | Admitting: Obstetrics & Gynecology

## 2021-03-05 ENCOUNTER — Encounter: Payer: Self-pay | Admitting: *Deleted

## 2021-03-05 VITALS — BP 120/78 | HR 101 | Resp 16 | Ht 64.25 in | Wt 270.0 lb

## 2021-03-05 DIAGNOSIS — Z90710 Acquired absence of both cervix and uterus: Secondary | ICD-10-CM

## 2021-03-05 DIAGNOSIS — Z01419 Encounter for gynecological examination (general) (routine) without abnormal findings: Secondary | ICD-10-CM | POA: Diagnosis not present

## 2021-03-05 DIAGNOSIS — R7309 Other abnormal glucose: Secondary | ICD-10-CM

## 2021-03-05 DIAGNOSIS — C50311 Malignant neoplasm of lower-inner quadrant of right female breast: Secondary | ICD-10-CM

## 2021-03-05 DIAGNOSIS — Z9079 Acquired absence of other genital organ(s): Secondary | ICD-10-CM

## 2021-03-05 DIAGNOSIS — Z90722 Acquired absence of ovaries, bilateral: Secondary | ICD-10-CM

## 2021-03-05 DIAGNOSIS — Z23 Encounter for immunization: Secondary | ICD-10-CM

## 2021-03-05 DIAGNOSIS — E66813 Obesity, class 3: Secondary | ICD-10-CM

## 2021-03-05 DIAGNOSIS — Z170 Estrogen receptor positive status [ER+]: Secondary | ICD-10-CM

## 2021-03-05 DIAGNOSIS — Z6841 Body Mass Index (BMI) 40.0 and over, adult: Secondary | ICD-10-CM

## 2021-03-05 MED ORDER — PHENTERMINE HCL 37.5 MG PO CAPS: 37.5000 mg | ORAL_CAPSULE | ORAL | 0 refills | Status: DC

## 2021-03-05 NOTE — Telephone Encounter (Signed)
I spoke with patient and advised she should check with her insurance co to find out if they cover nutritionist. [facility] said she knows she has no coverage but she questions can referral order be placed with diagnosis to indicate medically indicated and see if perhaps prior authorization will allow her to use insurance.  I told her I will place order with [place] and someone will call her to arrange appt.

## 2021-03-05 NOTE — Progress Notes (Signed)
Per pt request RN successfully faxed right silicone breast prosthetic and bra orders to [facility] in [place] [phone]).

## 2021-03-05 NOTE — Progress Notes (Signed)
Krystal Kelley Apr 02, 1971 [id number]   History:    50 y.o. G0 Single   RP: Established patient presenting for annual gyn exam    HPI: S/P TAH/BSO in 05/2018.  No pelvic pain.  Abstinent x last Pap test 12/2018 which was negative/HPV HR neg.  H/O Rt Breast Cancer on Anastrozole, followed by Dr Lindi Adie.  Breasts normal. Screening mammo 09/2020 Benign.  Urine normal.  BMs normal.  BMI 45.99, improved.  Will prescribe Phentermine. Refering to Nutritionist.  HBA1C today. Health labs with Fam MD.  Krystal Kelley 05/2020.    Past medical history,surgical history, family history and social history were all reviewed and documented in the EPIC chart.  Gynecologic History No LMP recorded (lmp unknown). Patient has had a hysterectomy.  Obstetric History OB History  Gravida Para Term Preterm AB Living  0 0 0 0 0 0  SAB IAB Ectopic Multiple Live Births  0 0 0 0 0     ROS: A ROS was performed and pertinent positives and negatives are included in the history.  GENERAL: No fevers or chills. HEENT: No change in vision, no earache, sore throat or sinus congestion. NECK: No pain or stiffness. CARDIOVASCULAR: No chest pain or pressure. No palpitations. PULMONARY: No shortness of breath, cough or wheeze. GASTROINTESTINAL: No abdominal pain, nausea, vomiting or diarrhea, melena or bright red blood per rectum. GENITOURINARY: No urinary frequency, urgency, hesitancy or dysuria. MUSCULOSKELETAL: No joint or muscle pain, no back pain, no recent trauma. DERMATOLOGIC: No rash, no itching, no lesions. ENDOCRINE: No polyuria, polydipsia, no heat or cold intolerance. No recent change in weight. HEMATOLOGICAL: No anemia or easy bruising or bleeding. NEUROLOGIC: No headache, seizures, numbness, tingling or weakness. PSYCHIATRIC: No depression, no loss of interest in normal activity or change in sleep pattern.     Exam:   BP 120/78   Pulse (!) 101   Resp 16   Ht 5' 4.25" (1.632 m)   Wt 270 lb (122.5 kg)   LMP  (LMP  Unknown)   BMI 45.99 kg/m   Body mass index is 45.99 kg/m.  General appearance : Well developed well nourished female. No acute distress HEENT: Eyes: no retinal hemorrhage or exudates,  Neck supple, trachea midline, no carotid bruits, no thyroidmegaly Lungs: Clear to auscultation, no rhonchi or wheezes, or rib retractions  Heart: Regular rate and rhythm, no murmurs or gallops Breast:Examined in sitting and supine position were symmetrical in appearance, no palpable masses or tenderness,  no skin retraction, no nipple inversion, no nipple discharge, no skin discoloration, no axillary or supraclavicular lymphadenopathy Abdomen: no palpable masses or tenderness, no rebound or guarding Extremities: no edema or skin discoloration or tenderness  Pelvic: Vulva: Normal             Vagina: No gross lesions or discharge  Cervix/Uterus absent  Adnexa  Without masses or tenderness  Anus: Normal   Assessment/Plan:  50 y.o. female for annual exam  1. Encounter for gynecological examination S/P TAH/BSO in 05/2018.  No pelvic pain.  Abstinent x last Pap test 12/2018 which was negative/HPV HR neg.  H/O Rt Breast Cancer on Anastrozole, followed by Dr Lindi Adie.  Breasts normal. Screening mammo 09/2020 Benign.  Urine normal.  BMs normal.  BMI 45.99, improved.  Will prescribe Phentermine. Refering to Nutritionist.  HBA1C today. Health labs with Fam MD.  Krystal Kelley 05/2020.   2. S/P TAH-BSO  3. Malignant neoplasm of lower-inner quadrant of right breast of female, estrogen receptor positive ([place]) Followed  by Dr Lindi Adie.  4. High glucose level - Hemoglobin A1c  5. Class 3 severe obesity due to excess calories with serious comorbidity and body mass index (BMI) of 45.0 to 49.9 in adult [facility]) Refer to Nutritionist.  Phentermine prescribed.  Low calorie/carb diet.  Intermittent fasting.  Increase fitness activities.  Other orders - phentermine 37.5 MG capsule; Take 1 capsule (37.5 mg total) by mouth every morning.   [name] MD, 12:42 PM 03/05/2021

## 2021-03-05 NOTE — Telephone Encounter (Signed)
Krystal Bruins, MD  P [facility] Triage  Obesity BMI 45.  Nutritionist needs to be covered by insurance.

## 2021-03-06 ENCOUNTER — Encounter: Payer: Self-pay | Admitting: *Deleted

## 2021-03-06 ENCOUNTER — Telehealth: Payer: Self-pay | Admitting: *Deleted

## 2021-03-06 LAB — HEMOGLOBIN A1C
Hgb A1c MFr Bld: 5.5 %{Hb}
Mean Plasma Glucose: 111 mg/dL
eAG (mmol/L): 6.2 mmol/L

## 2021-03-06 NOTE — Telephone Encounter (Signed)
Helene Kelp called from the mastectomy office requesting pt demographics and last office notes for insurance. Office notes and demographics were faxed to [phone].

## 2021-03-17 NOTE — Telephone Encounter (Signed)
Scheduled 04/22/2021.

## 2021-04-22 ENCOUNTER — Encounter: Payer: Self-pay | Admitting: Registered"

## 2021-04-22 ENCOUNTER — Encounter: Payer: Medicare Other | Attending: Obstetrics & Gynecology | Admitting: Registered"

## 2021-04-22 ENCOUNTER — Other Ambulatory Visit: Payer: Self-pay

## 2021-04-22 DIAGNOSIS — Z853 Personal history of malignant neoplasm of breast: Secondary | ICD-10-CM | POA: Insufficient documentation

## 2021-04-22 DIAGNOSIS — I10 Essential (primary) hypertension: Secondary | ICD-10-CM | POA: Insufficient documentation

## 2021-04-22 DIAGNOSIS — Z713 Dietary counseling and surveillance: Secondary | ICD-10-CM | POA: Insufficient documentation

## 2021-04-22 NOTE — Progress Notes (Signed)
Medical Nutrition Therapy  Appointment Start time:  11:44  Appointment End time:  12:40  Primary concerns today: none clearly stated  Referral diagnosis: obesity Preferred learning style: no preference indicated Learning readiness: contemplating, ready, change in progress   NUTRITION ASSESSMENT   Pt arrives 20 minutes late. Pt states she started gaining weight years before breast cancer. Reports history of endometriosis, was having breakthrough bleeding and "bad" cramps, and started taking birth control pills to help manage. States over time she believes this contributed to weight gain, although she ate the same during this time. States she thinks medical concerns increased weight along with medications.   Reports she does not eat a lot. States she will go all day without eating. States she will eat 1 meal at night and has been eating a lot at night and then going to bed. States mom will bring her breakfast sometimes. States she doesn't eat fast food daily. States she likes ice cream and eats daily. States she used to eat Suezanne Jac[employer] Chunky Monkey ice cream daily and it reminds her of grandma's recipe but has replaced with 3 oz vanilla ice cream cups. States she will eat 2-3 containers/day; 6-9 oz/day. States she doesn't think she eats enough and understands body is starving. States she is busy. States she doesn't drink enough either. Receives [facility] for [facility], disability, and EBT benefits  $750/month. Also receives food and medical/healthcare supplies once a month.   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   States her breast cancer medication has terrible side effects taking tamoxifen. States every joint in her body hurt and not sleeping well at night. States she typically goes to sleep at 5, 6, or 7 am each night. States her right knee gives challenges. States she has a lot of joint pain but is used to it. States she  tries to walk but wants to get back into pool therapy. States pool therapy is more individualized than water aerobics. States there are no barriers to attending water aerobics class but will prefer sleep over class due to lack of sleep the night prior. States she was attending pool therapy consistently 2x/week and she kept her appts because in her mind it was considered a medical appt. States pool therapy was a big help.   States she takes care of daughter and niece (ages 76 and 42 respectively). States they are busy with school schedules and extracurricular activities of dance, volleyball, and soccer. Reports daughter had recent seminar in [place] with [employer].   States she struggles with "saving the world" for others. Pt was on her phone during appt receiving messages and calls.    Clinical Medical Hx: breast cancer, cholecystectomy, HTN Medications: See list Labs: A1c (5.5) Notable Signs/Symptoms: increased fatigue, lack of sleep  Lifestyle & Dietary Hx  Estimated daily fluid intake: 32 oz Supplements: See list Sleep: 3 hrs/night  Stress / self-care: none Current average weekly physical activity: none reported  24-Hr Dietary Recall First Meal (11 am): strawberry waffle + cheese and eggs + 3 slices of bacon + cup of juice Snack:  Second Meal (5 pm): [employer] - Big Mac + water Snack (8:30 pm): 3 pack of turtles Third Meal (11 pm): bacon + cheese sandwich + 3 3 oz cups of vanilla ice cream + cup of juice Snack:  Beverages: juice (2*12 oz; 24 oz), water (1/2*16.9 oz; 8 oz); 32  oz   NUTRITION DIAGNOSIS  NB-1.1 Food and nutrition-related knowledge deficit As related to balanced meals.  As evidenced by pt verbalizes incomplete information.   NUTRITION INTERVENTION  Nutrition education (E-1) on the following topics: Pt was educated and counseled on the benefits of eating throughout the day, metabolism, eating to fuel the body, how skipping meals affects weight,  metabolism, and sleep. Discussed importance of time management skills, connecting with mental health provider, Pt agreed with goals listed.  Handouts Provided Include  None provided  Learning Style & Readiness for Change Teaching method utilized: Visual & Auditory  Demonstrated degree of understanding via: Teach Back  Barriers to learning/adherence to lifestyle change: precontemplative stage of change  Goals Established by Pt Consider getting connected with mental health therapist to help balance time management in life.  Get reconnected with pool therapy and/or attend water aerobics classes.    MONITORING & EVALUATION Dietary intake, weekly physical activity.  Next Steps  Patient is to call back for follow-up.

## 2021-04-22 NOTE — Patient Instructions (Addendum)
-   Consider getting connected with mental health therapist to help balance time management in life.   - Get reconnected with pool therapy and/or attend water aerobics classes.

## 2021-04-29 ENCOUNTER — Encounter: Payer: Self-pay | Admitting: Podiatry

## 2021-04-29 ENCOUNTER — Ambulatory Visit (INDEPENDENT_AMBULATORY_CARE_PROVIDER_SITE_OTHER): Payer: Medicare Other | Admitting: Podiatry

## 2021-04-29 ENCOUNTER — Other Ambulatory Visit: Payer: Self-pay

## 2021-04-29 DIAGNOSIS — M722 Plantar fascial fibromatosis: Secondary | ICD-10-CM

## 2021-04-29 NOTE — Progress Notes (Signed)
She presents today for a follow-up of her Planter fasciitis states that she just did a lot of walking in [place] that actually her feet are not hurting her with exception of her third toe of her right foot.  She states that she has been trying to dugout ingrown along the borders but states that she just cannot get it.  Objective: Vital signs are stable she is alert and oriented x3.  Pulses are palpable.  She has no pain on palpation medial calcaneal tubercles bilaterally.  She does have some tenderness on palpation of the distal aspect of the third toe of the right foot but does not demonstrate any erythema edema cellulitis drainage or odor.  Nail margins appear to be free of the borders.  Assessment: Contusion toe third right.  No Planter fasciitis at this point.  Plan: Follow-up with me again in about 3 months however I did encourage her to continue with Epson salts and warm water soaks and a small amount of Neosporin to the third toe.  Follow-up with me in 2 months

## 2021-05-12 ENCOUNTER — Other Ambulatory Visit: Payer: Self-pay | Admitting: *Deleted

## 2021-05-12 ENCOUNTER — Encounter: Payer: Self-pay | Admitting: Hematology and Oncology

## 2021-05-12 DIAGNOSIS — Z170 Estrogen receptor positive status [ER+]: Secondary | ICD-10-CM

## 2021-05-12 DIAGNOSIS — C50311 Malignant neoplasm of lower-inner quadrant of right female breast: Secondary | ICD-10-CM

## 2021-06-01 NOTE — Progress Notes (Signed)
Patient Care Team: Albina Billet, MD as PCP - General (Internal Medicine) Albina Billet, MD (Internal Medicine) Nicholas Lose, MD as Consulting Physician (Hematology and Oncology) Excell Seltzer, MD (Inactive) as Consulting Physician (General Surgery) Gery Pray, MD as Consulting Physician (Radiation Oncology) Gardenia Phlegm, NP as Nurse Practitioner (Hematology and Oncology)  DIAGNOSIS:    ICD-10-CM   1. Malignant neoplasm of lower-inner quadrant of right breast of female, estrogen receptor positive ([place])  C50.311    Z17.0       SUMMARY OF ONCOLOGIC HISTORY: Oncology History  Breast cancer of lower-inner quadrant of right female breast ([place])  05/14/2014 Mammogram   Right mammogram: 1.5 cm round mass in right breast at 3:00   05/14/2014 Breast US   Right breast: irregular, hypoechoic mass with indistinct margins at 3:00 mearying 1.1 x 0.8 x 1.3 cm.   05/16/2014 Initial Biopsy   Ultrasound-guided biopsy: Invasive ductal carcinoma with DCIS, grade 1-2, ER/PR positive, Ki-67 43%, HER-2 positive   05/28/2014 Breast MRI   1.4 x 1.2 x 1.0 cm biopsy-proven invasive ductal carcinoma and ductal carcinoma in situ in the lower inner quadrant of the right breast.    05/28/2014 Clinical Stage   Stage IA: T1c N0   06/13/2014 Procedure   Breast/Next: Variant of Unknown Significance in the ATM gene called p.[id number]>T, p.[id number]. Otherwise, no clinically significant mutation at ATM, BARD1, BRCA1, BRCA2, BRIP1, CDH1, CHEK2, MRE11A, MUTYH, NBN, NF1, PALB2, PTEN, RAD50, RAD51C, RAD51D, and TP53   06/18/2014 Surgery   Right breast lumpectomy: Invasive ductal carcinoma 1.3 cm negative for LVI; DCIS 1 mm from margins, 1 sentinel node negative, grade 3 ER 95%, PR 77%, HER-2 amplified ratio 8.25, Ki-67 43%   06/18/2014 Pathologic Stage   Stage IA: T1c N0 M0   07/18/2014 - 10/03/2014 Chemotherapy   Abraxane Herceptin weekly 12 followed by Herceptin maintenance every 3 weeks to complete one year  completed 07/03/2015   10/30/2014 - 12/17/2014 Radiation Therapy   Adjuvant radiation therapy: Right breast 50.4 gray in 28 fractions, lumpectomy cavity boost 12 gray in 6 fractions   02/01/2015 -  Anti-estrogen oral therapy   Tamoxifen 20 mg daily, switched to anastrozole on 09/12/18 after hysterectomy      CHIEF COMPLIANT: Follow-up of right breast cancer on anastrozole   INTERVAL HISTORY: Krystal Kelley is a 51 y.o. with above-mentioned history of right breast cancer treated with lumpectomy, adjuvant chemotherapy with Herceptin, and radiation who is currently on anastrozole. Mammogram on 09/18/2020 showed no evidence of malignancy bilaterally. She presents to the clinic today for follow-up.  In spite of the muscle aches and pains she kept up on the anastrozole therapy.  She will complete the treatment by October 2023.  She does have restriction of limitation of right arm range of motion.  ALLERGIES:  is allergic to tamoxifen.  MEDICATIONS:  Current Outpatient Medications  Medication Sig Dispense Refill   anastrozole (ARIMIDEX) 1 MG tablet Take 1 tablet (1 mg total) by mouth daily. 90 tablet 1   Calcium Carbonate (CALCIUM 500 PO) Take by mouth.     cycloSPORINE (RESTASIS) 0.05 % ophthalmic emulsion Place 1 drop into both eyes 2 (two) times daily.     Eluxadoline (VIBERZI) 100 MG TABS Take 1 tablet (100 mg total) by mouth 2 (two) times daily. 60 tablet 5   EUCRISA 2 % OINT      fluticasone (FLONASE) 50 MCG/ACT nasal spray Place 1 spray into both nostrils daily as needed for allergies.  hydrochlorothiazide (HYDRODIURIL) 25 MG tablet Take 25 mg by mouth every morning.     ketoconazole (NIZORAL) 2 % cream Apply 1 application topically daily as needed for irritation (irritated/itchy skin.). 60 g 6   melatonin 5 MG TABS Take 5 mg by mouth.     mometasone (ELOCON) 0.1 % cream Apply 1 application topically daily as needed (Rash). 45 g 6   Multiple Vitamin (MULTIVITAMIN WITH MINERALS) TABS  tablet Take 1 tablet by mouth daily. One-A-Day Multivitamin      phentermine 37.5 MG capsule Take 1 capsule (37.5 mg total) by mouth every morning. 90 capsule 0   valACYclovir (VALTREX) 500 MG tablet Take 500 mg by mouth 2 (two) times daily as needed (outbreaks.).      vitamin C (ASCORBIC ACID) 500 MG tablet Take 500 mg by mouth daily.      Zinc Sulfate (ZINC 15 PO) Take by mouth.     No current facility-administered medications for this visit.    PHYSICAL EXAMINATION: ECOG PERFORMANCE STATUS: 1 - Symptomatic but completely ambulatory  Vitals:   06/02/21 1200  BP: (!) 142/72  Pulse: (!) 109  Resp: 19  Temp: (!) 97.5 F (36.4 C)  SpO2: 100%   Filed Weights   06/02/21 1200  Weight: 270 lb 1.6 oz (122.5 kg)    BREAST: No palpable masses or nodules in either right or left breasts. No palpable axillary supraclavicular or infraclavicular adenopathy no breast tenderness or nipple discharge. (exam performed in the presence of a chaperone)  LABORATORY DATA:  I have reviewed the data as listed CMP Latest Ref Rng & Units 02/29/2020 01/10/2019 01/06/2018  Glucose 65 - 99 mg/dL 115(H) 96 97  BUN 7 - 25 mg/dL '14 12 11  ' Creatinine 0.50 - 1.05 mg/dL 0.84 1.02 0.89  Sodium 135 - 146 mmol/L 139 141 140  Potassium 3.5 - 5.3 mmol/L 4.2 3.6 3.9  Chloride 98 - 110 mmol/L 103 101 105  CO2 20 - 32 mmol/L '26 29 27  ' Calcium 8.6 - 10.4 mg/dL 10.0 9.5 9.4  Total Protein 6.1 - 8.1 g/dL 7.4 7.8 6.8  Total Bilirubin 0.2 - 1.2 mg/dL 0.4 0.4 0.3  Alkaline Phos 38 - 126 U/L - - -  AST 10 - 35 U/L 20 38(H) 24  ALT 6 - 29 U/L 30(H) 38(H) 24    Lab Results  Component Value Date   WBC 7.9 03/20/2020   HGB 12.8 03/20/2020   HCT 37.9 03/20/2020   MCV 92.0 03/20/2020   PLT 346 03/20/2020   NEUTROABS 4,716 03/20/2020    ASSESSMENT & PLAN:  Breast cancer of lower-inner quadrant of right female breast Right breast lumpectomy 06/18/2014: Invasive ductal carcinoma 1.3 cm negative for LVI; DCIS 1 mm from  margins, 1 sentinel node negative, grade 3 ER 95%, PR 77%, HER-2 amplified ratio 8.25, Ki-67 43% T1 cN0 M0 stage IA   Treatment summary S/P Adjuvant chemo with Abraxane Herceptin started 07/18/2014 completed 10/03/14, Herceptin maintenance completed 07/03/2015 Status post adjuvant radiation completed 12/17/2014 Started tamoxifen 12/2014 ----------------------------------------------------------------------------  Current treatment: Tamoxifen 20 mg daily 12/2014 switched to anastrozole 09/12/2018 since she had hysterectomy To be done Oct 2023   Anastrozole toxicities:  Occ hot flashes Joint aches She will complete antiestrogen therapy by October 2023.   Breast cancer surveillance: 1.  Breast exam 06/02/2021: Benign  2. mammogram 09/18/2020 at [facility]: Benign with breast density category A 12/30/2017: CT abdomen pelvis for left-sided abdominal pain: Fatty liver, uterine fibroid   Obesity: lost  35lbs. (on Slim fast) She will join exercise program and continue to lose weight.  Return to clinic in 1 year for follow-up with long-term survivorship clinic.    No orders of the defined types were placed in this encounter.  The patient has a good understanding of the overall plan. she agrees with it. she will call with any problems that may develop before the next visit here.  Total time spent: 20 mins including face to face time and time spent for planning, charting and coordination of care  Rulon Eisenmenger, MD, MPH 06/02/2021  I, Thana Ates, am acting as scribe for Dr. Nicholas Lose.  I have reviewed the above documentation for accuracy and completeness, and I agree with the above.

## 2021-06-02 ENCOUNTER — Other Ambulatory Visit: Payer: Self-pay

## 2021-06-02 ENCOUNTER — Inpatient Hospital Stay: Payer: Medicare Other | Attending: Hematology and Oncology | Admitting: Hematology and Oncology

## 2021-06-02 DIAGNOSIS — Z79811 Long term (current) use of aromatase inhibitors: Secondary | ICD-10-CM | POA: Diagnosis not present

## 2021-06-02 DIAGNOSIS — C50311 Malignant neoplasm of lower-inner quadrant of right female breast: Secondary | ICD-10-CM | POA: Diagnosis present

## 2021-06-02 DIAGNOSIS — Z170 Estrogen receptor positive status [ER+]: Secondary | ICD-10-CM | POA: Diagnosis not present

## 2021-06-02 DIAGNOSIS — Z79899 Other long term (current) drug therapy: Secondary | ICD-10-CM | POA: Insufficient documentation

## 2021-06-02 MED ORDER — ANASTROZOLE 1 MG PO TABS: 1.0000 mg | ORAL_TABLET | Freq: Every day | ORAL | 1 refills | Status: DC

## 2021-06-02 NOTE — Assessment & Plan Note (Signed)
Right breast lumpectomy 06/18/2014: Invasive ductal carcinoma 1.3 cm negative for LVI; DCIS 1 mm from margins, 1 sentinel node negative, grade 3 ER 95%, PR 77%, HER-2 amplified ratio 8.25, Ki-67 43% T1 cN0 M0 stage IA  Treatment summary S/P Adjuvant chemo with Abraxane Herceptin started 07/18/2014 completed 10/03/14, Herceptin maintenance completed 07/03/2015 Status post adjuvant radiation completed 12/17/2014 Started tamoxifen09/2016 ---------------------------------------------------------------------------- Current treatment:Tamoxifen 20 mg daily09/2016switched to anastrozole 09/12/2018 since she had hysterectomy To be done Oct 2023  Anastrozole toxicities: Occ hot flashes Joint aches  Breast cancer surveillance: 1.Breast exam2/14/2023: Benign  2.mammogram6/05/2020 at [facility]: Benign with breast density categoryA 12/30/2017: CT abdomen pelvis for left-sided abdominal pain: Fatty liver, uterine fibroid  Obesity:lost 35lbs. (on Slim fast)  Return to clinic in1 year for follow-up

## 2021-06-03 ENCOUNTER — Ambulatory Visit (HOSPITAL_BASED_OUTPATIENT_CLINIC_OR_DEPARTMENT_OTHER): Payer: Medicare Other | Admitting: Physical Therapy

## 2021-06-03 DIAGNOSIS — M65312 Trigger thumb, left thumb: Secondary | ICD-10-CM | POA: Insufficient documentation

## 2021-06-03 NOTE — Therapy (Incomplete)
OUTPATIENT PHYSICAL THERAPY LOWER EXTREMITY EVALUATION   Patient Name: Krystal Kelley MRN: [id number] DOB:1970-08-02, 51 y.o., female Today's Date: 06/03/2021    Past Medical History:  Diagnosis Date   Anxiety    Breast cancer, right breast ([place])    Depression    H/O laparoscopy    History of hysteroscopy    HSV infection    Peripheral edema    on lasix   PONV (postoperative nausea and vomiting)    Radiation 10/30/14-12/17/14   right breast 50.4 gray   Past Surgical History:  Procedure Laterality Date   BREAST BIOPSY  05/16/14   BREAST LUMPECTOMY Right 06/18/2014   bunion surgrery     CARPAL TUNNEL RELEASE Left 09/21/2016   Procedure: LEFT CARPAL TUNNEL RELEASE;  Surgeon: Daryll Brod, MD;  Location: [place];  Service: Orthopedics;  Laterality: Left;  REG/FAB   carpel tunnel     CHOLECYSTECTOMY  2015   COLONOSCOPY WITH PROPOFOL N/A 05/27/2020   Procedure: COLONOSCOPY WITH PROPOFOL;  Surgeon: Lucilla Lame, MD;  Location: [facility] ENDOSCOPY;  Service: Endoscopy;  Laterality: N/A;  PATIENT COVID POSITIVE 04/18/2021   HYSTERECTOMY ABDOMINAL WITH SALPINGO-OOPHORECTOMY  05/22/2018   Procedure: HYSTERECTOMY ABDOMINAL WITH BILATERAL SALPINGO-OOPHORECTOMY;  Surgeon: [name], MD;  Location: [place];  Service: Gynecology;;   PLANTAR FASCIECTOMY     PORTACATH PLACEMENT Left 06/18/2014   Procedure: INSERTION PORT-A-CATH;  Surgeon: Excell Seltzer, MD;  Location: [place];  Service: General;  Laterality: Left;   ROBOTIC ASSISTED LAPAROSCOPIC LYSIS OF ADHESION  05/22/2018   Procedure: XI ROBOTIC ASSISTED LAPAROSCOPIC LYSIS OF ADHESION;  Surgeon: [name], MD;  Location: [place];  Service: Gynecology;;   ROBOTIC ASSISTED TOTAL HYSTERECTOMY WITH BILATERAL SALPINGO OOPHERECTOMY Bilateral 05/22/2018   Procedure: ATTEMPTED XI ROBOTIC ASSISTED TOTAL HYSTERECTOMY WITH BILATERAL SALPINGO OOPHORECTOMY;  Surgeon: [name], MD;  Location: [place];  Service: Gynecology;  Laterality: Bilateral;   Patient Active Problem List   Diagnosis Date Noted   Encounter for screening colonoscopy 03/18/2020   Trigger middle finger of right hand 01/24/2019   Post-operative state 05/22/2018   Postoperative state 05/22/2018   Pain 07/13/2017   Posttraumatic stress disorder 12/09/2016   Leg edema, right 10/03/2014   Genetic testing 07/10/2014   Breast cancer of lower-inner quadrant of right female breast ([place]) 05/29/2014    PCP: Albina Billet, MD  REFERRING PROVIDER: Nicholas Lose, MD  REFERRING DIAG: C50.311,Z17.0 (ICD-10-CM) - Malignant neoplasm of lower-inner quadrant of right breast of female, estrogen receptor positive ([place])   THERAPY DIAG:  No diagnosis found.  ONSET DATE: 2016  SUBJECTIVE:   SUBJECTIVE STATEMENT:  Krystal Kelley is a 51 y.o. with above-mentioned history of right breast cancer treated with lumpectomy, adjuvant chemotherapy with Herceptin, and radiation who is currently on anastrozole. Mammogram on 09/18/2020 showed no evidence of malignancy bilaterally. She presents to the clinic today for follow-up.  In spite of the muscle aches and pains she kept up on the anastrozole therapy.   PERTINENT HISTORY: Breast cancer, lumpectomy, depression, anxiety  PAIN:  Are you having pain? {yes/no:20286} NPRS scale: ***/10 Pain location: *** Pain orientation: {Pain Orientation:25161}  PAIN TYPE: {type:313116} Pain description: {PAIN DESCRIPTION:21022940}  Aggravating factors: *** Relieving factors: ***  PRECAUTIONS: {Therapy precautions:24002}  WEIGHT BEARING RESTRICTIONS {Yes ***/No:24003}  FALLS:  Has patient fallen in last 6 months? {yes/no:20286}, Number of falls: ***  LIVING ENVIRONMENT: Lives with: {[facility] lives with:25569::"lives with their family"} Lives in: {  Lives in:25570} Stairs: {yes/no:20286}; {Stairs:24000} Has following equipment at home:  {Assistive devices:23999}  OCCUPATION: ***  PLOF: {PLOF:24004}  PATIENT GOALS ***   OBJECTIVE:   DIAGNOSTIC FINDINGS: ***  PATIENT SURVEYS:  {rehab surveys:24030}  COGNITION:  Overall cognitive status: {cognition:24006}     SENSATION:  Light touch: {intact/deficits:24005}  Stereognosis: {intact/deficits:24005}  Hot/Cold: {intact/deficits:24005}  Proprioception: {intact/deficits:24005}  MUSCLE LENGTH: Hamstrings: Right *** deg; Left *** deg Thomas test: Right *** deg; Left *** deg  POSTURE:  ***  PALPATION: ***  UE AROM:   A/PROM Right 06/03/2021 Left 06/03/2021  flexion    extension    abduction    adduction    internal rotation    external rotation     (Blank rows = not tested)  UE MMT:   LE AROM/PROM:  A/PROM Right 06/03/2021 Left 06/03/2021  Hip flexion    Hip extension    Hip abduction    Hip adduction    Hip internal rotation    Hip external rotation    Knee flexion    Knee extension     (Blank rows = not tested)  LE MMT:  MMT Right 06/03/2021 Left 06/03/2021  Hip flexion    Hip extension    Hip abduction    Hip adduction    Hip internal rotation    Hip external rotation    Knee flexion    Knee extension     (Blank rows = not tested)  LOWER EXTREMITY SPECIAL TESTS:  {LEspecialtests:26242}  FUNCTIONAL TESTS:  5 times sit to stand: ***  GAIT: Distance walked: 21ft Assistive device utilized: {Assistive devices:23999} Level of assistance: {Levels of assistance:24026} Comments: ***    TODAY'S TREATMENT: ***   PATIENT EDUCATION:  Education details: MOI, diagnosis, prognosis, anatomy, exercise progression, DOMS expectations, muscle firing,  envelope of function, HEP, POC Person educated: Patient Education method: Explanation, Demonstration, Tactile cues, Verbal cues, and Handouts Education comprehension: verbalized understanding, returned demonstration, verbal cues required, and tactile cues required   HOME EXERCISE  PROGRAM: ***  ASSESSMENT:  CLINICAL IMPRESSION: Patient is a 51 y.o. female who was seen today for physical therapy evaluation and treatment for cc of LE and shoulder pain 2/2 cancer and cancer treatments. Pt would benefit from continued skilled therapy in order to reach goals and maximize functional ________ strength and ROM for full return to PLOF.    OBJECTIVE IMPAIRMENTS Abnormal gait, decreased activity tolerance, decreased balance, decreased endurance, decreased mobility, difficulty walking, decreased ROM, decreased strength, hypomobility, increased edema, increased fascial restrictions, increased muscle spasms, impaired flexibility, impaired UE functional use, improper body mechanics, postural dysfunction, and pain.   ACTIVITY LIMITATIONS cleaning, community activity, driving, meal prep, occupation, laundry, yard work, shopping, and exercise and recreation .   PERSONAL FACTORS Age, Fitness, Past/current experiences, Time since onset of injury/illness/exacerbation, and 3+ comorbidities:    are also affecting patient's functional outcome.    REHAB POTENTIAL: Fair    CLINICAL DECISION MAKING: Evolving/moderate complexity  EVALUATION COMPLEXITY: Moderate   GOALS:  SHORT TERM GOALS:  STG Name Target Date Goal status  1 Pt will become independent with HEP in order to demonstrate synthesis of PT education.  Baseline:  07/15/2021 INITIAL  2 Pt will be able to demonstrate BHB and OH reach without pain in order to demonstrate functional improvement in UE function for self-care and house hold duties.  Baseline:  07/15/2021 INITIAL  3 Pt will report at least 2 pt reduction on NPRS scale for pain in order to demonstrate functional  improvement with household activity, self care, and ADL.  Baseline: 07/15/2021 INITIAL  4 Pt will be able to demonstrate ___ in order to demonstrate functional improvement in UE/LE function for self-care and house hold duties.  Baseline: 07/15/2021 INITIAL    LONG TERM GOALS:   LTG Name Target Date Goal status  1 Pt  will become independent with final HEP in order to demonstrate synthesis of PT education.  Baseline: 08/26/2021 INITIAL  2 Pt will be able to demonstrate ___ in order to demonstrate functional improvement in UE/LE function for self-care and house hold duties.  Baseline: 08/26/2021 INITIAL  3 Pt will be able to perform 5XSTS in under 12s  in order to demonstrate functional improvement above the cut off score for adults.  Baseline: 08/26/2021 INITIAL  4 Pt will be able to demonstrate/report ability to walk >___ mins without pain in order to demonstrate functional improvement and tolerance to exercise and community mobility.  Baseline: 08/26/2021 INITIAL   PLAN: PT FREQUENCY: 1-2x/week  PT DURATION: 12 weeks (likely D/C by 10 weeks)  PLANNED INTERVENTIONS: Therapeutic exercises, Therapeutic activity, Neuro Muscular re-education, Balance training, Gait training, Patient/Family education, Joint mobilization, Stair training, Aquatic Therapy, Dry Needling, Electrical stimulation, Spinal mobilization, Cryotherapy, Moist heat, Compression bandaging, scar mobilization, Splintting, Taping, Vasopneumatic device, Traction, Ultrasound, Ionotophoresis 4mg /ml Dexamethasone, and Manual therapy  PLAN FOR NEXT SESSION: ***   Daleen Bo, PT 06/03/2021, 8:58 AM

## 2021-06-05 ENCOUNTER — Other Ambulatory Visit: Payer: Self-pay | Admitting: Obstetrics & Gynecology

## 2021-06-09 NOTE — Telephone Encounter (Signed)
AEX due 02/2022.

## 2021-06-21 NOTE — Therapy (Incomplete)
OUTPATIENT PHYSICAL THERAPY SHOULDER EVALUATION   Patient Name: Krystal Kelley MRN: [id number] DOB:11/24/1970, 51 y.o., female Today's Date: 06/21/2021    Past Medical History:  Diagnosis Date   Anxiety    Breast cancer, right breast ([place])    Depression    H/O laparoscopy    History of hysteroscopy    HSV infection    Peripheral edema    on lasix   PONV (postoperative nausea and vomiting)    Radiation 10/30/14-12/17/14   right breast 50.4 gray   Past Surgical History:  Procedure Laterality Date   BREAST BIOPSY  05/16/14   BREAST LUMPECTOMY Right 06/18/2014   bunion surgrery     CARPAL TUNNEL RELEASE Left 09/21/2016   Procedure: LEFT CARPAL TUNNEL RELEASE;  Surgeon: Daryll Brod, MD;  Location: [place];  Service: Orthopedics;  Laterality: Left;  REG/FAB   carpel tunnel     CHOLECYSTECTOMY  2015   COLONOSCOPY WITH PROPOFOL N/A 05/27/2020   Procedure: COLONOSCOPY WITH PROPOFOL;  Surgeon: Lucilla Lame, MD;  Location: [facility] END[facility]COPY;  Service: Endoscopy;  Laterality: N/A;  PATIENT COVID POSITIVE 04/18/2021   HYSTERECTOMY ABDOMINAL WITH SALPINGO-OOPHORECTOMY  05/22/2018   Procedure: HYSTERECTOMY ABDOMINAL WITH BILATERAL SALPINGO-OOPHORECTOMY;  Surgeon: [name], MD;  Location: [place];  Service: Gynecology;;   PLANTAR FASCIECTOMY     PORTACATH PLACEMENT Left 06/18/2014   Procedure: INSERTION PORT-A-CATH;  Surgeon: Excell Seltzer, MD;  Location: [place];  Service: General;  Laterality: Left;   ROBOTIC ASSISTED LAPAROSCOPIC LYSIS OF ADHESION  05/22/2018   Procedure: XI ROBOTIC ASSISTED LAPAROSCOPIC LYSIS OF ADHESION;  Surgeon: [name], MD;  Location: [place];  Service: Gynecology;;   ROBOTIC ASSISTED TOTAL HYSTERECTOMY WITH BILATERAL SALPINGO OOPHERECTOMY Bilateral 05/22/2018   Procedure: ATTEMPTED XI ROBOTIC ASSISTED TOTAL HYSTERECTOMY WITH BILATERAL SALPINGO OOPHORECTOMY;  Surgeon: [name], MD;  Location: [place];  Service: Gynecology;  Laterality: Bilateral;   Patient Active Problem List   Diagnosis Date Noted   Encounter for screening colonoscopy 03/18/2020   Trigger middle finger of right hand 01/24/2019   Post-operative state 05/22/2018   Postoperative state 05/22/2018   Pain 07/13/2017   Posttraumatic stress disorder 12/09/2016   Leg edema, right 10/03/2014   Genetic testing 07/10/2014   Breast cancer of lower-inner quadrant of right female breast ([place]) 05/29/2014    PCP: Albina Billet, MD  REFERRING PROVIDER: Nicholas Lose, MD  REFERRING DIAG: C50.311,Z17.0 (ICD-10-CM) - Malignant neoplasm of lower-inner quadrant of right breast of female, estrogen receptor positive   THERAPY DIAG:  No diagnosis found.   ONSET DATE: ***  SUBJECTIVE:  SUBJECTIVE STATEMENT: Pt received PT in 2021....response  her bilat shoulder motion has been decreasing over the past few years, but the R shoulder motion is worse.    LeftMy joints just hurt from this Anastrozole and of course lack of hormones present in my body. My right shoulder I can barely lift been that way for about 38mhs... and my right knee are the two that's bothering me the most. However, when I was taking Pool Therapy it really helped me tremendously.   When????   PERTINENT HISTORY: Morbid obesity   right breast cancer and had lumpectomy (April 2016),   Anxiety and Depression  PAIN:  Are you having pain? {yes/no:20286} NPRS scale: ***/10 Pain location: *** Pain orientation: {Pain Orientation:25161}  PAIN TYPE: {type:313116} Pain description: {PAIN DESCRIPTION:21022940}  Aggravating factors: *** Relieving factors: ***  PRECAUTIONS: {Therapy precautions:24002}  WEIGHT BEARING RESTRICTIONS {Yes  ***/No:24003}  FALLS:  Has patient fallen in last 6 months? {yes/no:20286} Number of falls: ***  LIVING ENVIRONMENT: Lives with: {[facility] lives with:25569::"lives with their family"} Lives in: {Lives in:25570} Stairs: {yes/no:20286}; {Stairs:24000} Has following equipment at home: {Assistive devices:23999}  OCCUPATION: ***  PLOF: {PLOF:24004}  PATIENT GOALS ***  OBJECTIVE:   DIAGNOSTIC FINDINGS:  ***  PATIENT SURVEYS:  {rehab surveys:24030:a}  COGNITION:  Overall cognitive status: {cognition:24006}     SENSATION:  Light touch: {intact/deficits:24005}  Stereognosis: {intact/deficits:24005}  Hot/Cold: {intact/deficits:24005}  Proprioception: {intact/deficits:24005}  POSTURE: ***  UPPER EXTREMITY AROM/PROM:  A/PROM Right 06/21/2021 Left 06/21/2021  Shoulder flexion    Shoulder extension    Shoulder abduction    Shoulder adduction    Shoulder internal rotation    Shoulder external rotation    Elbow flexion    Elbow extension    Wrist flexion    Wrist extension    Wrist ulnar deviation    Wrist radial deviation    Wrist pronation    Wrist supination    (Blank rows = not tested)  UPPER EXTREMITY MMT:  MMT Right 06/21/2021 Left 06/21/2021  Shoulder flexion    Shoulder extension    Shoulder abduction    Shoulder adduction    Shoulder internal rotation    Shoulder external rotation    Middle trapezius    Lower trapezius    Elbow flexion    Elbow extension    Wrist flexion    Wrist extension    Wrist ulnar deviation    Wrist radial deviation    Wrist pronation    Wrist supination    Grip strength (lbs)    (Blank rows = not tested)  SHOULDER SPECIAL TESTS:  Impingement tests: {shoulder impingement test:25231:a}  SLAP lesions: {SLAP lesions:25232}  Instability tests: {shoulder instability test:25233}  Rotator cuff assessment: {rotator cuff assessment:25234}  Biceps assessment: {biceps assessment:25235}  JOINT MOBILITY TESTING:  ***  PALPATION:   ***   TODAY'S TREATMENT:  ***   PATIENT EDUCATION: Education details: *** Person educated: {Person educated:25204} Education method: {Education Method:25205} Education comprehension: {Education Comprehension:25206}   HOME EXERCISE PROGRAM: ***  ASSESSMENT:  CLINICAL IMPRESSION: Patient is a *** y.o. *** who was seen today for physical therapy evaluation and treatment for ***.    OBJECTIVE IMPAIRMENTS {opptimpairments:25111}.   ACTIVITY LIMITATIONS {activity limitations:25113}.   PERSONAL FACTORS {Personal factors:25162} are also affecting patient's functional outcome.    REHAB POTENTIAL: {rehabpotential:25112}  CLINICAL DECISION MAKING: {clinical decision making:25114}  EVALUATION COMPLEXITY: {Evaluation complexity:25115}   GOALS: Goals reviewed with patient? {yes/no:20286}  SHORT TERM GOALS:  *** Baseline: *** Target date: {follow up:25551}  Goal status: {GOALSTATUS:25110}  2.  *** Baseline: *** Target date: {follow up:25551} Goal status: {GOALSTATUS:25110}  3.  *** Baseline: *** Target date: {follow up:25551} Goal status: {GOALSTATUS:25110}  4.  *** Baseline: *** Target date: {follow up:25551} Goal status: {GOALSTATUS:25110}  5.  *** Baseline: *** Target date: {follow up:25551} Goal status: {GOALSTATUS:25110}  6.  *** Baseline: *** Target date: {follow up:25551} Goal status: {GOALSTATUS:25110}  LONG TERM GOALS:  *** Baseline: *** Target date: {follow up:25551} Goal status: {GOALSTATUS:25110}  2.  *** Baseline: *** Target date: {follow up:25551} Goal status: {GOALSTATUS:25110}  3.  *** Baseline: *** Target date: {follow up:25551} Goal status: {GOALSTATUS:25110}  4.  *** Baseline: *** Target date: {follow up:25551} Goal status: {GOALSTATUS:25110}  5.  *** Baseline: *** Target date: {follow up:25551} Goal status: {GOALSTATUS:25110}  6.  *** Baseline: *** Target date: {follow up:25551} Goal status:  {GOALSTATUS:25110}  PLAN: PT FREQUENCY: {rehab frequency:25116}  PT DURATION: {rehab duration:25117}  PLANNED INTERVENTIONS: {rehab planned interventions:25118::"Therapeutic exercises","Therapeutic activity","Neuromuscular re-education","Balance training","Gait training","Patient/Family education","Joint mobilization"}  PLAN FOR NEXT SESSION: Ronny Flurry, PT 06/21/2021, 8:08 AM

## 2021-06-22 ENCOUNTER — Ambulatory Visit (HOSPITAL_BASED_OUTPATIENT_CLINIC_OR_DEPARTMENT_OTHER): Payer: Medicare Other | Admitting: Physical Therapy

## 2021-06-29 ENCOUNTER — Encounter: Payer: Self-pay | Admitting: Podiatry

## 2021-06-29 ENCOUNTER — Ambulatory Visit (INDEPENDENT_AMBULATORY_CARE_PROVIDER_SITE_OTHER): Payer: Medicare Other | Admitting: Podiatry

## 2021-06-29 ENCOUNTER — Other Ambulatory Visit: Payer: Self-pay

## 2021-06-29 DIAGNOSIS — M722 Plantar fascial fibromatosis: Secondary | ICD-10-CM

## 2021-06-29 MED ORDER — TRIAMCINOLONE ACETONIDE 40 MG/ML IJ SUSP
20.0000 mg | Freq: Once | INTRAMUSCULAR | Status: AC
  Administered 2021-06-29: 20 mg

## 2021-06-29 NOTE — Progress Notes (Signed)
She presents today for follow-up of her bilateral Planter fasciitis states that she is doing quite well on the right side left side starting to hurt a little bit further distally in the mid arch. ? ?Objective: Some tenderness on palpation of the right arch but the left arch is more tender just distal to the plantar fascial calcaneal insertion site more around the inferior navicular area.  Radiating pain is noted distally. ? ?Assessment: Planter fasciitis bilateral left greater than right. ? ?Plan: Injected her heel 20 mg Kenalog 5 mg Marcaine point of maximal tenderness.  We will follow-up with her in a couple of months. ?

## 2021-07-07 ENCOUNTER — Encounter (HOSPITAL_BASED_OUTPATIENT_CLINIC_OR_DEPARTMENT_OTHER): Payer: Self-pay | Admitting: Physical Therapy

## 2021-07-07 ENCOUNTER — Ambulatory Visit (HOSPITAL_BASED_OUTPATIENT_CLINIC_OR_DEPARTMENT_OTHER): Payer: Medicare Other | Attending: Hematology and Oncology | Admitting: Physical Therapy

## 2021-07-07 ENCOUNTER — Other Ambulatory Visit: Payer: Self-pay

## 2021-07-07 DIAGNOSIS — M25561 Pain in right knee: Secondary | ICD-10-CM | POA: Diagnosis present

## 2021-07-07 DIAGNOSIS — M25572 Pain in left ankle and joints of left foot: Secondary | ICD-10-CM

## 2021-07-07 DIAGNOSIS — C50311 Malignant neoplasm of lower-inner quadrant of right female breast: Secondary | ICD-10-CM | POA: Diagnosis not present

## 2021-07-07 DIAGNOSIS — Z170 Estrogen receptor positive status [ER+]: Secondary | ICD-10-CM | POA: Diagnosis not present

## 2021-07-07 DIAGNOSIS — G8929 Other chronic pain: Secondary | ICD-10-CM | POA: Diagnosis present

## 2021-07-07 DIAGNOSIS — M25511 Pain in right shoulder: Secondary | ICD-10-CM | POA: Insufficient documentation

## 2021-07-07 DIAGNOSIS — M25571 Pain in right ankle and joints of right foot: Secondary | ICD-10-CM | POA: Insufficient documentation

## 2021-07-07 NOTE — Therapy (Signed)
 OUTPATIENT PHYSICAL THERAPY THORACOLUMBAR EVALUATION   Patient Name: Krystal Kelley MRN: [id number] DOB:[birth year], 51 y.o., female Today's Date: 07/07/2021   PT End of Session - 07/07/21 1006     Visit Number 1    Number of Visits 30    Date for PT Re-Evaluation 11/08/21    Authorization Type [employer] MCR    PT Start Time [phone]   pt arrived late   PT Stop Time 1030    PT Time Calculation (min) 46 min    Activity Tolerance Patient tolerated treatment well    Behavior During Therapy [facility] for tasks assessed/performed             Past Medical History:  Diagnosis Date   Anxiety    Breast cancer, right breast ([facility])    Depression    H/O laparoscopy    History of hysteroscopy    HSV infection    Peripheral edema    on lasix    PONV (postoperative nausea and vomiting)    Radiation 10/30/14-12/17/14   right breast 50.4 gray   Past Surgical History:  Procedure Laterality Date   BREAST BIOPSY  05/16/14   BREAST LUMPECTOMY Right 06/18/2014   bunion surgrery     CARPAL TUNNEL RELEASE Left 09/21/2016   Procedure: LEFT CARPAL TUNNEL RELEASE;  Surgeon: Murrell Kuba, MD;  Location: [facility];  Service: Orthopedics;  Laterality: Left;  REG/FAB   carpel tunnel     CHOLECYSTECTOMY  2015   COLONOSCOPY WITH PROPOFOL  N/A 05/27/2020   Procedure: COLONOSCOPY WITH PROPOFOL ;  Surgeon: Jinny Carmine, MD;  Location: [facility] END[facility]COPY;  Service: Endoscopy;  Laterality: N/A;  PATIENT COVID POSITIVE 04/18/2021   HYSTERECTOMY ABDOMINAL WITH SALPINGO-OOPHORECTOMY  05/22/2018   Procedure: HYSTERECTOMY ABDOMINAL WITH BILATERAL SALPINGO-OOPHORECTOMY;  Surgeon: Lavoie, Marie-Lyne, MD;  Location: [facility];  Service: Gynecology;;   PLANTAR FASCIECTOMY     PORTACATH PLACEMENT Left 06/18/2014   Procedure: INSERTION PORT-A-CATH;  Surgeon: Morene Olives, MD;  Location: [facility];  Service: General;  Laterality: Left;   ROBOTIC ASSISTED LAPAROSCOPIC LYSIS OF ADHESION   05/22/2018   Procedure: XI ROBOTIC ASSISTED LAPAROSCOPIC LYSIS OF ADHESION;  Surgeon: Lavoie, Marie-Lyne, MD;  Location: [facility];  Service: Gynecology;;   ROBOTIC ASSISTED TOTAL HYSTERECTOMY WITH BILATERAL SALPINGO OOPHERECTOMY Bilateral 05/22/2018   Procedure: ATTEMPTED XI ROBOTIC ASSISTED TOTAL HYSTERECTOMY WITH BILATERAL SALPINGO OOPHORECTOMY;  Surgeon: Lavoie, Marie-Lyne, MD;  Location: [facility];  Service: Gynecology;  Laterality: Bilateral;   Patient Active Problem List   Diagnosis Date Noted   Encounter for screening colonoscopy 03/18/2020   Trigger middle finger of right hand 01/24/2019   Post-operative state 05/22/2018   Postoperative state 05/22/2018   Pain 07/13/2017   Posttraumatic stress disorder 12/09/2016   Leg edema, right 10/03/2014   Genetic testing 07/10/2014   Breast cancer of lower-inner quadrant of right female breast ([facility]) 05/29/2014    PCP: Corlis Honor BROCKS, MD  REFERRING PROVIDER: Odean Potts, MD  REFERRING DIAG: C50.311,Z17.0 (ICD-10-CM) - Malignant neoplasm of lower-inner quadrant of right breast of female, estrogen receptor positive ([facility])  THERAPY DIAG:  Chronic right shoulder pain  Chronic pain of right knee  Pain in right ankle and joints of right foot  Pain in left ankle and joints of left foot  ONSET DATE: 2016 since tomoxafen, worse again about 3 mo ago  SUBJECTIVE:  SUBJECTIVE STATEMENT: My right knee and my right side gives me problems. My right shoulder is weak- has been like this about 3 mo. Cannot lift about a gallon of mild. Being okay is on and off. Unable to reach behind me. The ankles catch and get weak. Everything just hurts. Has not seen a rheumatologist.  PERTINENT HISTORY:  Lt plantar fasciectomy, bunion surgery  x3  PAIN:  Are you having pain? Yes: NPRS scale: 8/10 Pain location: bil ankles, right side of body Pain description: constant Aggravating factors: constant pain Relieving factors: moving    PRECAUTIONS: None  WEIGHT BEARING RESTRICTIONS No  FALLS:  Has patient fallen in last 6 months? No, Number of falls: 0  LIVING ENVIRONMENT: Lives with: lives with their family and Mom Lives in: House/apartment Stairs: Yes; steps to house   OCCUPATION: not working  PLOF: Independent  PATIENT GOALS decrease pain, comfortably walk 30 min for exercise (10 right now), squatting, lift with right arm (milk jug is too heavy right now)   OBJECTIVE:   PATIENT SURVEYS:  LEFS 24 Quick Dash 40.91  COGNITION:  Overall cognitive status: Within functional limits for tasks assessed     SENSATION: Tingling reported since chemo  POSTURE:  constantly rubbing hands, moving feet and kicking legs while sitting in eval.   LUMBAR ROM:   Active  A/PROM  07/07/2021  Flexion   Extension   Right lateral flexion   Left lateral flexion   Right rotation   Left rotation    (Blank rows = not tested)  LE ROM:  Active  Right 07/07/2021 Left 07/07/2021  Hip flexion    Hip extension    Hip abduction    Hip adduction    Hip internal rotation    Hip external rotation    Knee flexion    Knee extension    Ankle dorsiflexion    Ankle plantarflexion    Ankle inversion    Ankle eversion     (Blank rows = not tested)  LE MMT:  MMT Right 07/07/2021 Left 07/07/2021  Hip flexion    Hip extension    Hip abduction    Hip adduction    Hip internal rotation    Hip external rotation    Knee flexion    Knee extension    Ankle dorsiflexion    Ankle plantarflexion    Ankle inversion    Ankle eversion     (Blank rows = not tested)    GAIT: Distance walked: clinic <>lobby, approx 100 feet    Comments: WFL    TODAY'S TREATMENT  evaluation   PATIENT EDUCATION:  Education details:  Anatomy of condition, POC, HEP, exercise form/rationale, aquatics  Person educated: Patient Education method: Explanation Education comprehension: verbalized understanding and needs further education   HOME EXERCISE PROGRAM: Find walking limit  ASSESSMENT:  CLINICAL IMPRESSION: Patient is a 51 y.o. F who was seen today for physical therapy evaluation and treatment for generalized joint pain following treatments for breast CA.    OBJECTIVE IMPAIRMENTS decreased activity tolerance, decreased endurance, difficulty walking, increased muscle spasms, impaired sensation, impaired UE functional use, improper body mechanics, obesity, and pain.   ACTIVITY LIMITATIONS cleaning, community activity, driving, meal prep, laundry, and shopping.   PERSONAL FACTORS 3+ comorbidities: h/o multiple foot surgeries, h/o chemo, anxiety  are also affecting patient's functional outcome.    REHAB POTENTIAL: Good  CLINICAL DECISION MAKING: Unstable/unpredictable  EVALUATION COMPLEXITY: High   GOALS: Goals reviewed with patient? Yes  SHORT TERM  GOALS: Target date: 08/04/2021  Pt will demo full AROM of Rt shoulder Baseline: demo 50% at eval Goal status: INITIAL  2.  Pt will be aware of walking limits & utilize those times to exercise without increasing significant pain Baseline: asked her to start this at eval Goal status: INITIAL  3.  Pt will demo proper squat form to/from chair Baseline: very painful and compensates by leaning against chair/table at eval Goal status: INITIAL    LONG TERM GOALS: Target date: 11/08/2021  Able to squat appropriately to the floor pain <=3/10 Baseline: severe pain limits ability to perform this motion at eval Goal status: INITIAL  2.  Able to walk 30 min for exercise Baseline: very uncomfortable and limited to about 10 min at eval Goal status: INITIAL  3.  Able to use right arm for light functional activities without compensation Baseline: using Lt arm to  help move right arm frequently, unable to lift milk jug with just right arm Goal status: INITIAL  4.  LEFS to 42 which is MDC x2 Baseline: 24 at eval Goal status: INITIAL  5.  Quick Dash to improve by 11.2% Baseline: 40.91 at eval Goal status: INITIAL      PLAN: PT FREQUENCY: 1-2x/week  PT DURATION: other: 4 months  PLANNED INTERVENTIONS: Therapeutic exercises, Therapeutic activity, Neuromuscular re-education, Balance training, Gait training, Patient/Family education, Joint mobilization, Stair training, Aquatic Therapy, Spinal mobilization, Cryotherapy, Moist heat, Taping, and Manual therapy.  PLAN FOR NEXT SESSION: determined walking tolerance? Build walking program. Begin aquatics program   Niangua C. Boston Cookson PT, DPT 07/07/21 7:43 PM

## 2021-07-22 ENCOUNTER — Ambulatory Visit (HOSPITAL_BASED_OUTPATIENT_CLINIC_OR_DEPARTMENT_OTHER): Payer: Medicare Other | Admitting: Physical Therapy

## 2021-08-04 ENCOUNTER — Ambulatory Visit (HOSPITAL_BASED_OUTPATIENT_CLINIC_OR_DEPARTMENT_OTHER): Payer: Medicare Other | Attending: Hematology and Oncology | Admitting: Physical Therapy

## 2021-08-04 ENCOUNTER — Encounter (HOSPITAL_BASED_OUTPATIENT_CLINIC_OR_DEPARTMENT_OTHER): Payer: Self-pay | Admitting: Physical Therapy

## 2021-08-04 DIAGNOSIS — M25611 Stiffness of right shoulder, not elsewhere classified: Secondary | ICD-10-CM | POA: Insufficient documentation

## 2021-08-04 DIAGNOSIS — M25511 Pain in right shoulder: Secondary | ICD-10-CM | POA: Insufficient documentation

## 2021-08-04 DIAGNOSIS — M25561 Pain in right knee: Secondary | ICD-10-CM | POA: Insufficient documentation

## 2021-08-04 DIAGNOSIS — G8929 Other chronic pain: Secondary | ICD-10-CM | POA: Diagnosis present

## 2021-08-04 DIAGNOSIS — M25571 Pain in right ankle and joints of right foot: Secondary | ICD-10-CM | POA: Diagnosis present

## 2021-08-04 DIAGNOSIS — M25572 Pain in left ankle and joints of left foot: Secondary | ICD-10-CM | POA: Insufficient documentation

## 2021-08-04 NOTE — Therapy (Signed)
 OUTPATIENT PHYSICAL THERAPY TREATMENT NOTE   Patient Name: Krystal Kelley MRN: [id number] DOB:Feb 26, 1971, 51 y.o., female Today's Date: 08/04/2021  PCP: Corlis Honor BROCKS, MD REFERRING PROVIDER: Corlis Honor BROCKS, MD  END OF SESSION:   PT End of Session - 08/04/21 0910     Visit Number 2    Number of Visits 30    Date for PT Re-Evaluation 11/08/21    Authorization Type U[employer] MCR    PT Start Time 0911    PT Stop Time 0950    PT Time Calculation (min) 39 min    Activity Tolerance Patient tolerated treatment well    Behavior During Therapy [facility] for tasks assessed/performed             Past Medical History:  Diagnosis Date   Anxiety    Breast cancer, right breast (HCC)    Depression    H/O laparoscopy    History of hysteroscopy    HSV infection    Peripheral edema    on lasix    PONV (postoperative nausea and vomiting)    Radiation 10/30/14-12/17/14   right breast 50.4 gray   Past Surgical History:  Procedure Laterality Date   BREAST BIOPSY  05/16/14   BREAST LUMPECTOMY Right 06/18/2014   bunion surgrery     CARPAL TUNNEL RELEASE Left 09/21/2016   Procedure: LEFT CARPAL TUNNEL RELEASE;  Surgeon: Murrell Kuba, MD;  Location: [facility];  Service: Orthopedics;  Laterality: Left;  REG/FAB   carpel tunnel     CHOLECYSTECTOMY  2015   COLONOSCOPY WITH PROPOFOL  N/A 05/27/2020   Procedure: COLONOSCOPY WITH PROPOFOL ;  Surgeon: Jinny Carmine, MD;  Location: [facility];  Service: Endoscopy;  Laterality: N/A;  PATIENT COVID POSITIVE 04/18/2021   HYSTERECTOMY ABDOMINAL WITH SALPINGO-OOPHORECTOMY  05/22/2018   Procedure: HYSTERECTOMY ABDOMINAL WITH BILATERAL SALPINGO-OOPHORECTOMY;  Surgeon: Lavoie, Marie-Lyne, MD;  Location: [facility];  Service: Gynecology;;   PLANTAR FASCIECTOMY     PORTACATH PLACEMENT Left 06/18/2014   Procedure: INSERTION PORT-A-CATH;  Surgeon: Morene Olives, MD;  Location: [facility];  Service: General;  Laterality: Left;    ROBOTIC ASSISTED LAPAROSCOPIC LYSIS OF ADHESION  05/22/2018   Procedure: XI ROBOTIC ASSISTED LAPAROSCOPIC LYSIS OF ADHESION;  Surgeon: Lavoie, Marie-Lyne, MD;  Location: [facility];  Service: Gynecology;;   ROBOTIC ASSISTED TOTAL HYSTERECTOMY WITH BILATERAL SALPINGO OOPHERECTOMY Bilateral 05/22/2018   Procedure: ATTEMPTED XI ROBOTIC ASSISTED TOTAL HYSTERECTOMY WITH BILATERAL SALPINGO OOPHORECTOMY;  Surgeon: Lavoie, Marie-Lyne, MD;  Location: [facility] ;  Service: Gynecology;  Laterality: Bilateral;   Patient Active Problem List   Diagnosis Date Noted   Encounter for screening colonoscopy 03/18/2020   Trigger middle finger of right hand 01/24/2019   Post-operative state 05/22/2018   Postoperative state 05/22/2018   Pain 07/13/2017   Posttraumatic stress disorder 12/09/2016   Leg edema, right 10/03/2014   Genetic testing 07/10/2014   Breast cancer of lower-inner quadrant of right female breast (HCC) 05/29/2014    REFERRING DIAG: C50.311,Z17.0 (ICD-10-CM) - Malignant neoplasm of lower-inner quadrant of right breast of female, estrogen receptor positive (HCC)  THERAPY DIAG:  Chronic right shoulder pain  Chronic pain of right knee  Pain in right ankle and joints of right foot  Pain in left ankle and joints of left foot  Stiffness of right shoulder, not elsewhere classified  PERTINENT HISTORY: Lt plantar fasciectomy, bunion surgery x3  PRECAUTIONS: none  SUBJECTIVE:  Sorry I missed my last appointment but I was up all  night with my feet, toes and calves cramping  PAIN:  Are you having pain? Yes: NPRS scale: 8/10 Pain location: bil ankles, right side of body Pain description: constant Aggravating factors: constant pain Relieving factors: moving      PRECAUTIONS: None   WEIGHT BEARING RESTRICTIONS No   FALLS:  Has patient fallen in last 6 months? No, Number of falls: 0   LIVING ENVIRONMENT: Lives with: lives with their family and Mom Lives  in: House/apartment Stairs: Yes; steps to house     OCCUPATION: not working   PLOF: Independent   PATIENT GOALS decrease pain, comfortably walk 30 min for exercise (10 right now), squatting, lift with right arm (milk jug is too heavy right now)     OBJECTIVE:    PATIENT SURVEYS:  LEFS 24 Quick Dash 40.91   COGNITION:           Overall cognitive status: Within functional limits for tasks assessed                          SENSATION: Tingling reported since chemo   POSTURE:  constantly rubbing hands, moving feet and kicking legs while sitting in eval.    LUMBAR ROM:    Active  A/PROM  07/07/2021  Flexion    Extension    Right lateral flexion    Left lateral flexion    Right rotation    Left rotation     (Blank rows = not tested)   LE ROM:   Active  Right 07/07/2021 Left 07/07/2021  Hip flexion      Hip extension      Hip abduction      Hip adduction      Hip internal rotation      Hip external rotation      Knee flexion      Knee extension      Ankle dorsiflexion      Ankle plantarflexion      Ankle inversion      Ankle eversion       (Blank rows = not tested)   LE MMT:   MMT Right 07/07/2021 Left 07/07/2021  Hip flexion      Hip extension      Hip abduction      Hip adduction      Hip internal rotation      Hip external rotation      Knee flexion      Knee extension      Ankle dorsiflexion      Ankle plantarflexion      Ankle inversion      Ankle eversion       (Blank rows = not tested)       GAIT: Distance walked: clinic <>lobby, approx 100 feet     Comments: WFL       TODAY'S TREATMENT  08/04/21 Pt seen for aquatic therapy today.  Treatment took place in water 3.25-4.8 ft in depth at the [facility] pool. Temp of water was 92.  Pt entered/exited the pool via stairs step through pattern independently with bilat rail.  Intro to setting EDU on benefits of water therapy and properties of water  Walking forward, back and side  stepping x 8 widths Seated flutter kicking; add/abd 3x20 reps STS from 3 (bottom) 2x5 Hamstring and gastroc stretching 3x20s ea Standing horizontal abd/add; add/abd; shoulder flex/ext x5-10 using yellow hand buoy.  Cues for scapular retraction horizontal ex. Pec stretching  holding to wall and rotating trunk R/L 3x20s  Pt requires buoyancy for support and to offload joints with strengthening exercises. Viscosity of the water is needed for resistance of strengthening; water current perturbations provides challenge to standing balance unsupported, requiring increased core activation.      PATIENT EDUCATION:  Education details: Teacher, music of condition, POC, HEP, exercise form/rationale, aquatics   Person educated: Patient Education method: Explanation Education comprehension: verbalized understanding and needs further education     HOME EXERCISE PROGRAM: Find walking limit;    08/04/21 10-15 minutes    ASSESSMENT:   CLINICAL IMPRESSION: Pt introduced to setting.  She demonstrates safety and indep in setting.  Slightly late for appointment.  She is directed through stretching and strengthening exercises and tolerates without increase in sx.  She rates her pain 8/10 which is normal for her.  Does have some issues with cramping that can stimey her day.  Pt edu on use of OTC suppliments/nutrition  that may improve as well as increasing her water intake.  She reports decreased sensation in right shoulder with stretching mistaking it for not stretching far enough to feel. She is cautioned be mindful of injuring herself with over stretching. Says walking limit is 10-15 minutes. Some difficulty with time on task.       OBJECTIVE IMPAIRMENTS decreased activity tolerance, decreased endurance, difficulty walking, increased muscle spasms, impaired sensation, impaired UE functional use, improper body mechanics, obesity, and pain.    ACTIVITY LIMITATIONS cleaning, community activity, driving, meal  prep, laundry, and shopping.    PERSONAL FACTORS 3+ comorbidities: h/o multiple foot surgeries, h/o chemo, anxiety  are also affecting patient's functional outcome.      REHAB POTENTIAL: Good   CLINICAL DECISION MAKING: Unstable/unpredictable   EVALUATION COMPLEXITY: High     GOALS: Goals reviewed with patient? Yes   SHORT TERM GOALS: Target date: 08/04/2021   Pt will demo full AROM of Rt shoulder Baseline: demo 50% at eval Goal status: INITIAL   2.  Pt will be aware of walking limits & utilize those times to exercise without increasing significant pain Baseline: asked her to start this at eval Goal status: INITIAL   3.  Pt will demo proper squat form to/from chair Baseline: very painful and compensates by leaning against chair/table at eval Goal status: INITIAL       LONG TERM GOALS: Target date: 11/08/2021   Able to squat appropriately to the floor pain <=3/10 Baseline: severe pain limits ability to perform this motion at eval Goal status: INITIAL   2.  Able to walk 30 min for exercise Baseline: very uncomfortable and limited to about 10 min at eval Goal status: INITIAL   3.  Able to use right arm for light functional activities without compensation Baseline: using Lt arm to help move right arm frequently, unable to lift milk jug with just right arm Goal status: INITIAL   4.  LEFS to 42 which is MDC x2 Baseline: 24 at eval Goal status: INITIAL   5.  Quick Dash to improve by 11.2% Baseline: 40.91 at eval Goal status: INITIAL           PLAN: PT FREQUENCY: 1-2x/week   PT DURATION: other: 4 months   PLANNED INTERVENTIONS: Therapeutic exercises, Therapeutic activity, Neuromuscular re-education, Balance training, Gait training, Patient/Family education, Joint mobilization, Stair training, Aquatic Therapy, Spinal mobilization, Cryotherapy, Moist heat, Taping, and Manual therapy.   PLAN FOR NEXT SESSION:  Build walking program. Begin aquatics program  Carlina Derks Kem) Verdene Creson MPT 08/04/2021, 10:37 AM

## 2021-08-11 ENCOUNTER — Ambulatory Visit (HOSPITAL_BASED_OUTPATIENT_CLINIC_OR_DEPARTMENT_OTHER): Payer: Medicare Other | Admitting: Physical Therapy

## 2021-08-11 ENCOUNTER — Encounter (HOSPITAL_BASED_OUTPATIENT_CLINIC_OR_DEPARTMENT_OTHER): Payer: Self-pay | Admitting: Physical Therapy

## 2021-08-11 DIAGNOSIS — M25511 Pain in right shoulder: Secondary | ICD-10-CM | POA: Diagnosis not present

## 2021-08-11 DIAGNOSIS — G8929 Other chronic pain: Secondary | ICD-10-CM

## 2021-08-11 DIAGNOSIS — M25572 Pain in left ankle and joints of left foot: Secondary | ICD-10-CM

## 2021-08-11 DIAGNOSIS — M25571 Pain in right ankle and joints of right foot: Secondary | ICD-10-CM

## 2021-08-11 NOTE — Therapy (Signed)
 OUTPATIENT PHYSICAL THERAPY TREATMENT NOTE   Patient Name: Krystal Kelley MRN: [id number] DOB:January 16, 1971, 51 y.o., female Today's Date: 08/11/2021  PCP: Corlis Honor BROCKS, MD REFERRING PROVIDER: Corlis Honor BROCKS, MD  END OF SESSION:   PT End of Session - 08/11/21 1005     Visit Number 3    Number of Visits 30    Date for PT Re-Evaluation 11/08/21    Authorization Type UHC MCR    PT Start Time 1000   15 minutes late for appointment   PT Stop Time 1040    PT Time Calculation (min) 40 min    Activity Tolerance Patient tolerated treatment well    Behavior During Therapy [facility] for tasks assessed/performed              Past Medical History:  Diagnosis Date   Anxiety    Breast cancer, right breast (HCC)    Depression    H/O laparoscopy    History of hysteroscopy    HSV infection    Peripheral edema    on lasix    PONV (postoperative nausea and vomiting)    Radiation 10/30/14-12/17/14   right breast 50.4 gray   Past Surgical History:  Procedure Laterality Date   BREAST BIOPSY  05/16/14   BREAST LUMPECTOMY Right 06/18/2014   bunion surgrery     CARPAL TUNNEL RELEASE Left 09/21/2016   Procedure: LEFT CARPAL TUNNEL RELEASE;  Surgeon: Murrell Kuba, MD;  Location: [facility];  Service: Orthopedics;  Laterality: Left;  REG/FAB   carpel tunnel     CHOLECYSTECTOMY  2015   COLONOSCOPY WITH PROPOFOL  N/A 05/27/2020   Procedure: COLONOSCOPY WITH PROPOFOL ;  Surgeon: Jinny Carmine, MD;  Location: [facility];  Service: Endoscopy;  Laterality: N/A;  PATIENT COVID POSITIVE 04/18/2021   HYSTERECTOMY ABDOMINAL WITH SALPINGO-OOPHORECTOMY  05/22/2018   Procedure: HYSTERECTOMY ABDOMINAL WITH BILATERAL SALPINGO-OOPHORECTOMY;  Surgeon: Lavoie, Marie-Lyne, MD;  Location: [facility] [place];  Service: Gynecology;;   PLANTAR FASCIECTOMY     PORTACATH PLACEMENT Left 06/18/2014   Procedure: INSERTION PORT-A-CATH;  Surgeon: Morene Olives, MD;  [facility];   Service: General;  Laterality: Left;   ROBOTIC ASSISTED LAPAROSCOPIC LYSIS OF ADHESION  05/22/2018   Procedure: XI ROBOTIC ASSISTED LAPAROSCOPIC LYSIS OF ADHESION;  Surgeon: Lavoie, Marie-Lyne, MD;  Location: [facility];  Service: Gynecology;;   ROBOTIC ASSISTED TOTAL HYSTERECTOMY WITH BILATERAL SALPINGO OOPHERECTOMY Bilateral 05/22/2018   Procedure: ATTEMPTED XI ROBOTIC ASSISTED TOTAL HYSTERECTOMY WITH BILATERAL SALPINGO OOPHORECTOMY;  Surgeon: Lavoie, Marie-Lyne, MD;  Location: [facility];  Service: Gynecology;  Laterality: Bilateral;   Patient Active Problem List   Diagnosis Date Noted   Encounter for screening colonoscopy 03/18/2020   Trigger middle finger of right hand 01/24/2019   Post-operative state 05/22/2018   Postoperative state 05/22/2018   Pain 07/13/2017   Posttraumatic stress disorder 12/09/2016   Leg edema, right 10/03/2014   Genetic testing 07/10/2014   Breast cancer of lower-inner quadrant of right female breast (HCC) 05/29/2014    REFERRING DIAG: C50.311,Z17.0 (ICD-10-CM) - Malignant neoplasm of lower-inner quadrant of right breast of female, estrogen receptor positive (HCC)  THERAPY DIAG:  Chronic right shoulder pain  Chronic pain of right knee  Pain in right ankle and joints of right foot  Pain in left ankle and joints of left foot  PERTINENT HISTORY: Lt plantar fasciectomy, bunion surgery x3  PRECAUTIONS: none  SUBJECTIVE: Car trouble, sorry I'm late  PAIN:  Are you having pain? Yes:  NPRS scale: 6/10 Pain location: bil ankles, right side of body Pain description: constant Aggravating factors: constant pain Relieving factors: moving      PRECAUTIONS: None   WEIGHT BEARING RESTRICTIONS No   FALLS:  Has patient fallen in last 6 months? No, Number of falls: 0   LIVING ENVIRONMENT: Lives with: lives with their family and Mom Lives in: House/apartment Stairs: Yes; steps to house     OCCUPATION: not working    PLOF: Independent   PATIENT GOALS decrease pain, comfortably walk 30 min for exercise (10 right now), squatting, lift with right arm (milk jug is too heavy right now)     OBJECTIVE:    PATIENT SURVEYS:  LEFS 24 Quick Dash 40.91   COGNITION:           Overall cognitive status: Within functional limits for tasks assessed                          SENSATION: Tingling reported since chemo   POSTURE:  constantly rubbing hands, moving feet and kicking legs while sitting in eval.    LUMBAR ROM:    Active  A/PROM  07/07/2021  Flexion    Extension    Right lateral flexion    Left lateral flexion    Right rotation    Left rotation     (Blank rows = not tested)   LE ROM:   Active  Right 07/07/2021 Left 07/07/2021  Hip flexion      Hip extension      Hip abduction      Hip adduction      Hip internal rotation      Hip external rotation      Knee flexion      Knee extension      Ankle dorsiflexion      Ankle plantarflexion      Ankle inversion      Ankle eversion       (Blank rows = not tested)   LE MMT:   MMT Right 07/07/2021 Left 07/07/2021  Hip flexion      Hip extension      Hip abduction      Hip adduction      Hip internal rotation      Hip external rotation      Knee flexion      Knee extension      Ankle dorsiflexion      Ankle plantarflexion      Ankle inversion      Ankle eversion       (Blank rows = not tested)       GAIT: Distance walked: clinic <>lobby, approx 100 feet     Comments: WFL       TODAY'S TREATMENT  08/04/21 Pt seen for aquatic therapy today.  Treatment took place in water 3.25-4.8 ft in depth at the [facility] pool. Temp of water was 92.  Pt entered/exited the pool via stairs step through pattern independently with bilat rail.  Intro to setting EDU on benefits of water therapy and properties of water  Walking forward, back and side stepping x 8 widths Seated flutter kicking; add/abd 3x20 reps STS from 3  (bottom) 2x5 Stretching adductors, 3 x 20s hold; Runners stretch hamstring Standing horizontal abd/add; add/abd; shoulder flex/ext x5-10 using yellow hand buoy.  Cues for scapular retraction horizontal ex. Kick board push downs 2x15 for core strengthening Kick board rotation for core rotational resistance  R/L x10 Kick board push pull 2x15 Side lunges with 1 foam hand buoy shoulder add/abd 6 widths Pec stretching holding to wall and rotating trunk R/L 3x20s Pt requires buoyancy for support and to offload joints with strengthening exercises. Viscosity of the water is needed for resistance of strengthening; water current perturbations provides challenge to standing balance unsupported, requiring increased core activation.      PATIENT EDUCATION:  Education details: Teacher, music of condition, POC, HEP, exercise form/rationale, aquatics   Person educated: Patient Education method: Explanation Education comprehension: verbalized understanding and needs further education     HOME EXERCISE PROGRAM: Find walking limit;    08/04/21 10-15 minutes    ASSESSMENT:   CLINICAL IMPRESSION: Pt reports she has not yet attempted to find her limit with walking as instructed for HEP (she reports she will) but she has walked through [place] spending up to 2 hours. She reports slight decrease in pain overall but some sx in right knee. She is planning on completing aqua aerobics class later today if she feels up to it.  She is cautioned not to overdo. Focus today on core strengthening.  Some complaints of shoulder discomfort with kick board use.  Easily modified to relieve.  Goals ongoing.  OBJECTIVE IMPAIRMENTS decreased activity tolerance, decreased endurance, difficulty walking, increased muscle spasms, impaired sensation, impaired UE functional use, improper body mechanics, obesity, and pain.    ACTIVITY LIMITATIONS cleaning, community activity, driving, meal prep, laundry, and shopping.    PERSONAL FACTORS 3+  comorbidities: h/o multiple foot surgeries, h/o chemo, anxiety  are also affecting patient's functional outcome.      REHAB POTENTIAL: Good   CLINICAL DECISION MAKING: Unstable/unpredictable   EVALUATION COMPLEXITY: High     GOALS: Goals reviewed with patient? Yes   SHORT TERM GOALS: Target date: 08/04/2021   Pt will demo full AROM of Rt shoulder Baseline: demo 50% at eval Goal status: INITIAL   2.  Pt will be aware of walking limits & utilize those times to exercise without increasing significant pain Baseline: asked her to start this at eval Goal status: INITIAL   3.  Pt will demo proper squat form to/from chair Baseline: very painful and compensates by leaning against chair/table at eval Goal status: INITIAL       LONG TERM GOALS: Target date: 11/08/2021   Able to squat appropriately to the floor pain <=3/10 Baseline: severe pain limits ability to perform this motion at eval Goal status: INITIAL   2.  Able to walk 30 min for exercise Baseline: very uncomfortable and limited to about 10 min at eval Goal status: INITIAL   3.  Able to use right arm for light functional activities without compensation Baseline: using Lt arm to help move right arm frequently, unable to lift milk jug with just right arm Goal status: INITIAL   4.  LEFS to 42 which is MDC x2 Baseline: 24 at eval Goal status: INITIAL   5.  Quick Dash to improve by 11.2% Baseline: 40.91 at eval Goal status: INITIAL           PLAN: PT FREQUENCY: 1-2x/week   PT DURATION: other: 4 months   PLANNED INTERVENTIONS: Therapeutic exercises, Therapeutic activity, Neuromuscular re-education, Balance training, Gait training, Patient/Family education, Joint mobilization, Stair training, Aquatic Therapy, Spinal mobilization, Cryotherapy, Moist heat, Taping, and Manual therapy.   PLAN FOR NEXT SESSION:  Build walking program.  Aquatics: general strengthening with extra focus on core.     Braelee Herrle Kem)  Annalyce Lanpher MPT  08/11/2021, 11:37 AM

## 2021-08-13 ENCOUNTER — Telehealth: Payer: Self-pay | Admitting: Podiatry

## 2021-08-13 ENCOUNTER — Other Ambulatory Visit: Payer: Self-pay | Admitting: Podiatry

## 2021-08-13 ENCOUNTER — Ambulatory Visit (HOSPITAL_BASED_OUTPATIENT_CLINIC_OR_DEPARTMENT_OTHER): Payer: Medicare Other | Admitting: Physical Therapy

## 2021-08-13 ENCOUNTER — Encounter (HOSPITAL_BASED_OUTPATIENT_CLINIC_OR_DEPARTMENT_OTHER): Payer: Self-pay | Admitting: Physical Therapy

## 2021-08-13 DIAGNOSIS — M25572 Pain in left ankle and joints of left foot: Secondary | ICD-10-CM

## 2021-08-13 DIAGNOSIS — G8929 Other chronic pain: Secondary | ICD-10-CM

## 2021-08-13 DIAGNOSIS — M25511 Pain in right shoulder: Secondary | ICD-10-CM | POA: Diagnosis not present

## 2021-08-13 DIAGNOSIS — M25571 Pain in right ankle and joints of right foot: Secondary | ICD-10-CM

## 2021-08-13 MED ORDER — TRAMADOL HCL 50 MG PO TABS: 50.0000 mg | ORAL_TABLET | Freq: Three times a day (TID) | ORAL | 0 refills | Status: AC | PRN

## 2021-08-13 NOTE — Progress Notes (Unsigned)
ra

## 2021-08-13 NOTE — Therapy (Signed)
 OUTPATIENT PHYSICAL THERAPY TREATMENT NOTE   Patient Name: Krystal Kelley MRN: [id number] DOB:1970-12-04, 51 y.o., female Today's Date: 08/13/2021  PCP: Corlis Honor BROCKS, MD REFERRING PROVIDER: Corlis Honor BROCKS, MD  END OF SESSION:   PT End of Session - 08/13/21 1210     Visit Number 4    Number of Visits 30    Date for PT Re-Evaluation 11/08/21    Authorization Type [employer] MCR    PT Start Time 1155   pt arrived late   PT Stop Time 1243    PT Time Calculation (min) 48 min    Activity Tolerance Patient tolerated treatment well    Behavior During Therapy [facility] for tasks assessed/performed              Past Medical History:  Diagnosis Date   Anxiety    Breast cancer, right breast (HCC)    Depression    H/O laparoscopy    History of hysteroscopy    HSV infection    Peripheral edema    on lasix    PONV (postoperative nausea and vomiting)    Radiation 10/30/14-12/17/14   right breast 50.4 gray   Past Surgical History:  Procedure Laterality Date   BREAST BIOPSY  05/16/14   BREAST LUMPECTOMY Right 06/18/2014   bunion surgrery     CARPAL TUNNEL RELEASE Left 09/21/2016   Procedure: LEFT CARPAL TUNNEL RELEASE;  Surgeon: Murrell Kuba, MD;  Location: [facility];  Service: Orthopedics;  Laterality: Left;  REG/FAB   carpel tunnel     CHOLECYSTECTOMY  2015   COLONOSCOPY WITH PROPOFOL  N/A 05/27/2020   Procedure: COLONOSCOPY WITH PROPOFOL ;  Surgeon: Jinny Carmine, MD;  Location: [facility];  Service: Endoscopy;  Laterality: N/A;  PATIENT COVID POSITIVE 04/18/2021   HYSTERECTOMY ABDOMINAL WITH SALPINGO-OOPHORECTOMY  05/22/2018   Procedure: HYSTERECTOMY ABDOMINAL WITH BILATERAL SALPINGO-OOPHORECTOMY;  Surgeon: Lavoie, Marie-Lyne, MD;  Location: [facility];  Service: Gynecology;;   PLANTAR FASCIECTOMY     PORTACATH PLACEMENT Left 06/18/2014   Procedure: INSERTION PORT-A-CATH;  Surgeon: Morene Olives, MD;  Location: [facility];  Service: General;   Laterality: Left;   ROBOTIC ASSISTED LAPAROSCOPIC LYSIS OF ADHESION  05/22/2018   Procedure: XI ROBOTIC ASSISTED LAPAROSCOPIC LYSIS OF ADHESION;  Surgeon: Lavoie, Marie-Lyne, MD;  Location: [facility];  Service: Gynecology;;   ROBOTIC ASSISTED TOTAL HYSTERECTOMY WITH BILATERAL SALPINGO OOPHERECTOMY Bilateral 05/22/2018   Procedure: ATTEMPTED XI ROBOTIC ASSISTED TOTAL HYSTERECTOMY WITH BILATERAL SALPINGO OOPHORECTOMY;  Surgeon: Lavoie, Marie-Lyne, MD;  Location: [facility];  Service: Gynecology;  Laterality: Bilateral;   Patient Active Problem List   Diagnosis Date Noted   Encounter for screening colonoscopy 03/18/2020   Trigger middle finger of right hand 01/24/2019   Post-operative state 05/22/2018   Postoperative state 05/22/2018   Pain 07/13/2017   Posttraumatic stress disorder 12/09/2016   Leg edema, right 10/03/2014   Genetic testing 07/10/2014   Breast cancer of lower-inner quadrant of right female breast (HCC) 05/29/2014    REFERRING DIAG: C50.311,Z17.0 (ICD-10-CM) - Malignant neoplasm of lower-inner quadrant of right breast of female, estrogen receptor positive (HCC)  THERAPY DIAG:  Chronic right shoulder pain  Chronic pain of right knee  Pain in right ankle and joints of right foot  Pain in left ankle and joints of left foot  PERTINENT HISTORY: Lt plantar fasciectomy, bunion surgery x3  PRECAUTIONS: none  SUBJECTIVE: Pt reports she was running late today.  She hasn't yet seen how long she  can walk for without cart, but plans to soon.   PAIN:  Are you having pain? Yes  NPRS scale: 7.5/10 Pain location: generalized   (and 8/10 in Rt knee) Pain description: constant Aggravating factors: constant pain Relieving factors: moving      PRECAUTIONS: None   WEIGHT BEARING RESTRICTIONS No   FALLS:  Has patient fallen in last 6 months? No, Number of falls: 0   LIVING ENVIRONMENT: Lives with: lives with their family and Mom Lives in:  House/apartment Stairs: Yes; steps to house     OCCUPATION: not working   PLOF: Independent   PATIENT GOALS decrease pain, comfortably walk 30 min for exercise (10 right now), squatting, lift with right arm (milk jug is too heavy right now)     OBJECTIVE:  *Findings taken at [facility] unless otherwise noted.    PATIENT SURVEYS:  LEFS 24 Quick Dash 40.91   COGNITION:           Overall cognitive status: Within functional limits for tasks assessed                          SENSATION: Tingling reported since chemo   POSTURE:  constantly rubbing hands, moving feet and kicking legs while sitting in eval.    LUMBAR ROM:     Active  A/PROM  07/07/2021  Flexion    Extension    Right lateral flexion    Left lateral flexion    Right rotation    Left rotation     (Blank rows = not tested)   LE ROM:   Active  Right 07/07/2021 Left 07/07/2021  Hip flexion      Hip extension      Hip abduction      Hip adduction      Hip internal rotation      Hip external rotation      Knee flexion      Knee extension      Ankle dorsiflexion      Ankle plantarflexion      Ankle inversion      Ankle eversion       (Blank rows = not tested)   LE MMT:   MMT Right 07/07/2021 Left 07/07/2021  Hip flexion      Hip extension      Hip abduction      Hip adduction      Hip internal rotation      Hip external rotation      Knee flexion      Knee extension      Ankle dorsiflexion      Ankle plantarflexion      Ankle inversion      Ankle eversion       (Blank rows = not tested)   08/13/21:   bilat shoulder flexion - full without pain   Abdct/ER: L - full, R - can reach hand behind head in scapular plane, with pain / tightness     GAIT: Distance walked: clinic <>lobby, approx 100 feet     Comments: WFL       TODAY'S TREATMENT  08/13/21 Pt seen for aquatic therapy today.  Treatment took place in water 3.25-4.8 ft in depth at the [facility] pool. Temp of water was 91.  Pt  entered/exited the pool via stairs step through pattern independently with bilat rail.  Walking forward, back and side stepping x 3 laps each High knee marching forward / backward  Walking forward / backward with arms as paddles under the water   Alternating  L/R shoulder flex to/from neutral with rainbow paddles (25ft water)  horizontal abd/add;  add/abd x 10 each with cues for scapular retraction Yellow noodle between between legs:  bicycle, jumping jack (with arms under water)- legs suspended Seated on 4th step: STS 5 reps x 2;  flutter kicking 20 x 2 Pec stretching holding to wall and rotating trunk R/L 3x20s On pool deck:  quad stretch with foot under bench x 20 sec x 2 reps each;  seated hip flexor stretch x 15 sec RLE  Pt requires buoyancy for support and to offload joints with strengthening exercises. Viscosity of the water is needed for resistance of strengthening; water current perturbations provides challenge to standing balance unsupported, requiring increased core activation.      PATIENT EDUCATION:  Education details: exercise form/rationale Person educated: Patient Education method: Explanation Education comprehension: verbalized understanding and needs further education     HOME EXERCISE PROGRAM: Find walking limit;    08/04/21 10-15 minutes    ASSESSMENT:   CLINICAL IMPRESSION: Pt reports she has not yet attempted to find her limit with walking as instructed but plans to soon.  She reports reduction of overall pain to 5/10 while in the water.  She tolerated all exercises well, without increase in any joint pain. Pt reported good stretch to quad in seated position on deck to address RLE tightness; may manually add this to her HEP next visit. She would also benefit from Rt shoulder stretch for abdct/ER (supine stargazer).  Goals ongoing.  OBJECTIVE IMPAIRMENTS decreased activity tolerance, decreased endurance, difficulty walking, increased muscle spasms, impaired sensation,  impaired UE functional use, improper body mechanics, obesity, and pain.    ACTIVITY LIMITATIONS cleaning, community activity, driving, meal prep, laundry, and shopping.    PERSONAL FACTORS 3+ comorbidities: h/o multiple foot surgeries, h/o chemo, anxiety  are also affecting patient's functional outcome.      REHAB POTENTIAL: Good   CLINICAL DECISION MAKING: Unstable/unpredictable   EVALUATION COMPLEXITY: High     GOALS: Goals reviewed with patient? Yes   SHORT TERM GOALS: Target date: 08/04/2021   Pt will demo full AROM of Rt shoulder Baseline: demo 50% at eval Goal status: Ongoing    2.  Pt will be aware of walking limits & utilize those times to exercise without increasing significant pain Baseline: asked her to start this at eval Goal status: Ongoing    3.  Pt will demo proper squat form to/from chair Baseline: very painful and compensates by leaning against chair/table at eval Goal status: Ongoing       LONG TERM GOALS: Target date: 11/08/2021   Able to squat appropriately to the floor pain <=3/10 Baseline: severe pain limits ability to perform this motion at eval Goal status: INITIAL   2.  Able to walk 30 min for exercise Baseline: very uncomfortable and limited to about 10 min at eval Goal status: INITIAL   3.  Able to use right arm for light functional activities without compensation Baseline: using Lt arm to help move right arm frequently, unable to lift milk jug with just right arm Goal status: INITIAL   4.  LEFS to 42 which is MDC x2 Baseline: 24 at eval Goal status: INITIAL   5.  Quick Dash to improve by 11.2% Baseline: 40.91 at eval Goal status: INITIAL           PLAN: PT FREQUENCY: 1-2x/week   PT  DURATION: other: 4 months   PLANNED INTERVENTIONS: Therapeutic exercises, Therapeutic activity, Neuromuscular re-education, Balance training, Gait training, Patient/Family education, Joint mobilization, Stair training, Aquatic Therapy, Spinal  mobilization, Cryotherapy, Moist heat, Taping, and Manual therapy.   PLAN FOR NEXT SESSION:  Build walking program.  Aquatics: general strengthening with extra focus on core. Delon Aquas, PTA 08/13/21 2:20 PM

## 2021-08-13 NOTE — Telephone Encounter (Signed)
Patient would like to know if you can call her in something for the pain in her foot, the injection you gave her on 06/29/21 did nothing for her , she has been icing it and that is not helping either.    Pharmacy : [facility] in [place]

## 2021-08-14 NOTE — Telephone Encounter (Signed)
Tramadol sent in for her yesterday ?

## 2021-08-18 ENCOUNTER — Ambulatory Visit (HOSPITAL_BASED_OUTPATIENT_CLINIC_OR_DEPARTMENT_OTHER): Payer: Medicare Other | Admitting: Physical Therapy

## 2021-08-20 ENCOUNTER — Ambulatory Visit (INDEPENDENT_AMBULATORY_CARE_PROVIDER_SITE_OTHER): Payer: Medicare Other | Admitting: Psychologist

## 2021-08-20 ENCOUNTER — Encounter (HOSPITAL_BASED_OUTPATIENT_CLINIC_OR_DEPARTMENT_OTHER): Payer: Self-pay | Admitting: Physical Therapy

## 2021-08-20 ENCOUNTER — Ambulatory Visit (HOSPITAL_BASED_OUTPATIENT_CLINIC_OR_DEPARTMENT_OTHER): Payer: Medicare Other | Attending: Hematology and Oncology | Admitting: Physical Therapy

## 2021-08-20 DIAGNOSIS — M25572 Pain in left ankle and joints of left foot: Secondary | ICD-10-CM | POA: Diagnosis present

## 2021-08-20 DIAGNOSIS — G8929 Other chronic pain: Secondary | ICD-10-CM | POA: Insufficient documentation

## 2021-08-20 DIAGNOSIS — M25611 Stiffness of right shoulder, not elsewhere classified: Secondary | ICD-10-CM | POA: Insufficient documentation

## 2021-08-20 DIAGNOSIS — M25561 Pain in right knee: Secondary | ICD-10-CM | POA: Insufficient documentation

## 2021-08-20 DIAGNOSIS — M25511 Pain in right shoulder: Secondary | ICD-10-CM | POA: Diagnosis present

## 2021-08-20 DIAGNOSIS — F411 Generalized anxiety disorder: Secondary | ICD-10-CM

## 2021-08-20 DIAGNOSIS — M25571 Pain in right ankle and joints of right foot: Secondary | ICD-10-CM | POA: Insufficient documentation

## 2021-08-20 NOTE — Progress Notes (Signed)
                Curlie Macken, PsyD 
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
## 2021-08-20 NOTE — Therapy (Signed)
 OUTPATIENT PHYSICAL THERAPY TREATMENT NOTE   Patient Name: Krystal Kelley MRN: [id number] DOB:[birth year], 51 y.o., female Today's Date: 08/20/2021  PCP: Corlis Honor BROCKS, MD REFERRING PROVIDER: Corlis Honor BROCKS, MD  END OF SESSION:   PT End of Session - 08/20/21 1200     Visit Number 5    Number of Visits 30    Date for PT Re-Evaluation 11/08/21    Authorization Type UHC MCR    PT Start Time 1155   pt arrived late   PT Stop Time 1233    PT Time Calculation (min) 38 min    Activity Tolerance Patient tolerated treatment well    Behavior During Therapy [facility] for tasks assessed/performed              Past Medical History:  Diagnosis Date   Anxiety    Breast cancer, right breast (HCC)    Depression    H/O laparoscopy    History of hysteroscopy    HSV infection    Peripheral edema    on lasix    PONV (postoperative nausea and vomiting)    Radiation 10/30/14-12/17/14   right breast 50.4 gray   Past Surgical History:  Procedure Laterality Date   BREAST BIOPSY  05/16/14   BREAST LUMPECTOMY Right 06/18/2014   bunion surgrery     CARPAL TUNNEL RELEASE Left 09/21/2016   Procedure: LEFT CARPAL TUNNEL RELEASE;  Surgeon: Murrell Kuba, MD;  Location: [facility];  Service: Orthopedics;  Laterality: Left;  REG/FAB   carpel tunnel     CHOLECYSTECTOMY  2015   COLONOSCOPY WITH PROPOFOL  N/A 05/27/2020   Procedure: COLONOSCOPY WITH PROPOFOL ;  Surgeon: Jinny Carmine, MD;  Location: [facility];  Service: Endoscopy;  Laterality: N/A;  PATIENT COVID POSITIVE 04/18/2021   HYSTERECTOMY ABDOMINAL WITH SALPINGO-OOPHORECTOMY  05/22/2018   Procedure: HYSTERECTOMY ABDOMINAL WITH BILATERAL SALPINGO-OOPHORECTOMY;  Surgeon: Lavoie, Marie-Lyne, MD;  Location: [facility];  Service: Gynecology;;   PLANTAR FASCIECTOMY     PORTACATH PLACEMENT Left 06/18/2014   Procedure: INSERTION PORT-A-CATH;  Surgeon: Morene Olives, MD;  Location: [facility];  Service: General;   Laterality: Left;   ROBOTIC ASSISTED LAPAROSCOPIC LYSIS OF ADHESION  05/22/2018   Procedure: XI ROBOTIC ASSISTED LAPAROSCOPIC LYSIS OF ADHESION;  Surgeon: Lavoie, Marie-Lyne, MD;  Location: [facility];  Service: Gynecology;;   ROBOTIC ASSISTED TOTAL HYSTERECTOMY WITH BILATERAL SALPINGO OOPHERECTOMY Bilateral 05/22/2018   Procedure: ATTEMPTED XI ROBOTIC ASSISTED TOTAL HYSTERECTOMY WITH BILATERAL SALPINGO OOPHORECTOMY;  Surgeon: Lavoie, Marie-Lyne, MD;  Location: [facility];  Service: Gynecology;  Laterality: Bilateral;   Patient Active Problem List   Diagnosis Date Noted   Encounter for screening colonoscopy 03/18/2020   Trigger middle finger of right hand 01/24/2019   Post-operative state 05/22/2018   Postoperative state 05/22/2018   Pain 07/13/2017   Posttraumatic stress disorder 12/09/2016   Leg edema, right 10/03/2014   Genetic testing 07/10/2014   Breast cancer of lower-inner quadrant of right female breast (HCC) 05/29/2014    REFERRING DIAG: C50.311,Z17.0 (ICD-10-CM) - Malignant neoplasm of lower-inner quadrant of right breast of female, estrogen receptor positive (HCC)  THERAPY DIAG:  Chronic right shoulder pain  Chronic pain of right knee  Pain in right ankle and joints of right foot  Pain in left ankle and joints of left foot  Stiffness of right shoulder, not elsewhere classified  PERTINENT HISTORY: Lt plantar fasciectomy, bunion surgery x3  PRECAUTIONS: none  SUBJECTIVE: Pt reports she was running late today.  She reports she started Tramadol  on Friday, but she had adverse reaction - itchy and tired. She stopped taking it.   PAIN:  Are you having pain? Yes  NPRS scale: 7/10 Pain location: generalized    Pain description: constant Aggravating factors: constant pain Relieving factors: moving      PRECAUTIONS: None   WEIGHT BEARING RESTRICTIONS No   FALLS:  Has patient fallen in last 6 months? No, Number of falls: 0   LIVING  ENVIRONMENT: Lives with: lives with their family and Mom Lives in: House/apartment Stairs: Yes; steps to house     OCCUPATION: not working   PLOF: Independent   PATIENT GOALS decrease pain, comfortably walk 30 min for exercise (10 right now), squatting, lift with right arm (milk jug is too heavy right now)     OBJECTIVE:  *[facility] at [facility] unless otherwise noted.    PATIENT SURVEYS:  LEFS 24 Quick Dash 40.91   COGNITION:           Overall cognitive status: Within functional limits for tasks assessed                          SENSATION: Tingling reported since chemo   POSTURE:  constantly rubbing hands, moving feet and kicking legs while sitting in eval.    LUMBAR ROM:     Active  A/PROM  07/07/2021  Flexion    Extension    Right lateral flexion    Left lateral flexion    Right rotation    Left rotation     (Blank rows = not tested)   LE ROM:   Active  Right 07/07/2021 Left 07/07/2021  Hip flexion      Hip extension      Hip abduction      Hip adduction      Hip internal rotation      Hip external rotation      Knee flexion      Knee extension      Ankle dorsiflexion      Ankle plantarflexion      Ankle inversion      Ankle eversion       (Blank rows = not tested)   LE MMT:   MMT Right 07/07/2021 Left 07/07/2021  Hip flexion      Hip extension      Hip abduction      Hip adduction      Hip internal rotation      Hip external rotation      Knee flexion      Knee extension      Ankle dorsiflexion      Ankle plantarflexion      Ankle inversion      Ankle eversion       (Blank rows = not tested)   08/13/21:   bilat shoulder flexion - full without pain   Abdct/ER: L - full, R - can reach hand behind head in scapular plane, with pain / tightness     GAIT: Distance walked: clinic <>lobby, approx 100 feet     Comments: WFL       TODAY'S TREATMENT  08/20/21 Pt seen for aquatic therapy today.  Treatment took place in water 3.25-4.8 ft in  depth at the [facility] pool. Temp of water was 91.  Pt entered/exited the pool via stairs step through pattern independently with bilat rail.  Walking forward, back and side stepping x 4 laps  each Yellow noodle between between legs:  bicycle, jumping jack (with arms under water)- legs suspended Seated on 4th step: STS 5 reps x 2;  flutter kicking 20 x 2 High knee marching forward Kickboard push down (board flat on water) x 12 Kickboard push/pull (board vertical) x 12 Standing quad stretch with ankle supported by square blue noodle x 20s x 2 each LE Standing hamstring stretch with LE on wall Pec stretch holding blue noodle behind back x 10s x 2  Pt requires buoyancy for support and to offload joints with strengthening exercises. Viscosity of the water is needed for resistance of strengthening; water current perturbations provides challenge to standing balance unsupported, requiring increased core activation.      PATIENT EDUCATION:  Education details: exercise form/rationale Person educated: Patient Education method: Explanation Education comprehension: verbalized understanding and needs further education     HOME EXERCISE PROGRAM: Find walking limit;    08/04/21 10-15 minutes  Initiated HEP, but haven't issued it yet:   Access Code: [email or web address] URL: [email or web address] Date: 08/20/2021 Prepared by: [facility] - [address]  Exercises - Supine Chest Stretch with Elbows Bent  - 2 x daily - 7 x weekly - 2 reps - 15-20 seconds hold - Single Arm Doorway Pec Stretch at 120 Degrees Abduction  - 2 x daily - 7 x weekly - 2 reps - 15-20 seconds hold - Seated Straddle on Flotation Forward Breast Stroke Arms and Bicycle Legs  - 1 x daily - 3 x weekly - Standing Knee Flexion  - 1 x daily - 3 x weekly - 2 sets - 10 reps - Lobbyist with Noodle at [employer]  - 1 x daily - 7 x weekly - 1 sets - 3 reps - 20 seconds hold - Squat  - 1 x daily - 3 x  weekly - 2 sets - 10 reps   ASSESSMENT:   CLINICAL IMPRESSION: She participates well throughout session without increase in pain. Pt tolerated standing quad stretch with noodle assist well. Encouraged pt to search for pool she can continue to complete aquatic exercises once d/c from therapy.  Goals ongoing.  OBJECTIVE IMPAIRMENTS decreased activity tolerance, decreased endurance, difficulty walking, increased muscle spasms, impaired sensation, impaired UE functional use, improper body mechanics, obesity, and pain.    ACTIVITY LIMITATIONS cleaning, community activity, driving, meal prep, laundry, and shopping.    PERSONAL FACTORS 3+ comorbidities: h/o multiple foot surgeries, h/o chemo, anxiety  are also affecting patient's functional outcome.      REHAB POTENTIAL: Good   CLINICAL DECISION MAKING: Unstable/unpredictable   EVALUATION COMPLEXITY: High     GOALS: Goals reviewed with patient? Yes   SHORT TERM GOALS: Target date: 08/04/2021   Pt will demo full AROM of Rt shoulder Baseline: demo 50% at eval Goal status: Ongoing    2.  Pt will be aware of walking limits & utilize those times to exercise without increasing significant pain Baseline: asked her to start this at eval Goal status: Ongoing    3.  Pt will demo proper squat form to/from chair Baseline: very painful and compensates by leaning against chair/table at eval Goal status: Ongoing       LONG TERM GOALS: Target date: 11/08/2021   Able to squat appropriately to the floor pain <=3/10 Baseline: severe pain limits ability to perform this motion at eval Goal status: INITIAL   2.  Able to walk 30 min for exercise Baseline: very uncomfortable and limited to about 10  min at eval Goal status: INITIAL   3.  Able to use right arm for light functional activities without compensation Baseline: using Lt arm to help move right arm frequently, unable to lift milk jug with just right arm Goal status: INITIAL   4.  LEFS to  42 which is MDC x2 Baseline: 24 at eval Goal status: INITIAL   5.  Quick Dash to improve by 11.2% Baseline: 40.91 at eval Goal status: INITIAL           PLAN: PT FREQUENCY: 1-2x/week   PT DURATION: other: 4 months   PLANNED INTERVENTIONS: Therapeutic exercises, Therapeutic activity, Neuromuscular re-education, Balance training, Gait training, Patient/Family education, Joint mobilization, Stair training, Aquatic Therapy, Spinal mobilization, Cryotherapy, Moist heat, Taping, and Manual therapy.   PLAN FOR NEXT SESSION: Aquatics: general strengthening with extra focus on core.   Delon Aquas, PTA 08/20/21 12:44 PM

## 2021-08-20 NOTE — Plan of Care (Signed)

## 2021-08-25 ENCOUNTER — Ambulatory Visit (HOSPITAL_BASED_OUTPATIENT_CLINIC_OR_DEPARTMENT_OTHER): Payer: Medicare Other | Admitting: Physical Therapy

## 2021-08-27 ENCOUNTER — Ambulatory Visit (HOSPITAL_BASED_OUTPATIENT_CLINIC_OR_DEPARTMENT_OTHER): Payer: Medicare Other | Admitting: Physical Therapy

## 2021-09-01 ENCOUNTER — Ambulatory Visit (HOSPITAL_BASED_OUTPATIENT_CLINIC_OR_DEPARTMENT_OTHER): Payer: Medicare Other | Admitting: Physical Therapy

## 2021-09-02 ENCOUNTER — Ambulatory Visit (INDEPENDENT_AMBULATORY_CARE_PROVIDER_SITE_OTHER): Payer: Medicare Other | Admitting: Podiatry

## 2021-09-02 DIAGNOSIS — M722 Plantar fascial fibromatosis: Secondary | ICD-10-CM | POA: Diagnosis not present

## 2021-09-02 DIAGNOSIS — L03031 Cellulitis of right toe: Secondary | ICD-10-CM

## 2021-09-02 MED ORDER — CEPHALEXIN 500 MG PO CAPS: 500.0000 mg | ORAL_CAPSULE | Freq: Three times a day (TID) | ORAL | 0 refills | Status: DC

## 2021-09-02 MED ORDER — TRIAMCINOLONE ACETONIDE 40 MG/ML IJ SUSP
20.0000 mg | Freq: Once | INTRAMUSCULAR | Status: AC
  Administered 2021-09-02: 20 mg

## 2021-09-02 NOTE — Progress Notes (Signed)
She presents today chief complaint of painful left heel states that after the injection last time it did not get any better.  She states that that is because usually gets perfect after that.  She is also complaining of a painful third toe right foot.  States that she has to pick a nail spicule out of the corner. ? ?Objective: Vital signs are stable she is alert and oriented x3 pulses are palpable.  There is no sign of nail spicule to be or fibular border of the third digit of the right foot.  I see only a mild loss of the eponychium or cuticle.  There is some mild erythema associated with it no purulence no malodor.  She also has pain proximal to the area that we inject usually on her left foot. ? ?Assessment: Proximal Planter fasciitis left.  Right foot demonstrates a mild paronychia third digit right. ? ?Plan: Discussed etiology pathology and surgical therapies discussed with her that it may be beneficial to perform chemical matricectomy to those type's.  She understands and is amendable to it.  We will discuss that again next visit.  I offered her another injection to the left heel little more proximal than the last one replaced.  We did that today with 10 mg Kenalog 5 mg Marcaine left heel. ?

## 2021-09-03 ENCOUNTER — Other Ambulatory Visit: Payer: Self-pay | Admitting: Obstetrics & Gynecology

## 2021-09-04 ENCOUNTER — Ambulatory Visit (HOSPITAL_BASED_OUTPATIENT_CLINIC_OR_DEPARTMENT_OTHER): Payer: Medicare Other | Admitting: Physical Therapy

## 2021-09-04 ENCOUNTER — Other Ambulatory Visit: Payer: Self-pay | Admitting: Obstetrics & Gynecology

## 2021-09-04 NOTE — Telephone Encounter (Signed)
Last filled on 06/05/21 for 90 day supply. Last annual exam/office visit was 02/2021

## 2021-09-07 NOTE — Telephone Encounter (Signed)
Refill request sent to [facility] on 09/04/21

## 2021-09-08 ENCOUNTER — Ambulatory Visit (HOSPITAL_BASED_OUTPATIENT_CLINIC_OR_DEPARTMENT_OTHER): Payer: Medicare Other | Admitting: Physical Therapy

## 2021-09-10 ENCOUNTER — Ambulatory Visit (HOSPITAL_BASED_OUTPATIENT_CLINIC_OR_DEPARTMENT_OTHER): Payer: Self-pay | Admitting: Physical Therapy

## 2021-09-15 ENCOUNTER — Ambulatory Visit (HOSPITAL_BASED_OUTPATIENT_CLINIC_OR_DEPARTMENT_OTHER): Payer: Self-pay | Admitting: Physical Therapy

## 2021-09-17 ENCOUNTER — Ambulatory Visit (HOSPITAL_BASED_OUTPATIENT_CLINIC_OR_DEPARTMENT_OTHER): Payer: Self-pay | Admitting: Physical Therapy

## 2021-09-23 ENCOUNTER — Encounter (HOSPITAL_BASED_OUTPATIENT_CLINIC_OR_DEPARTMENT_OTHER): Payer: Self-pay | Admitting: Physical Therapy

## 2021-09-23 ENCOUNTER — Ambulatory Visit (HOSPITAL_BASED_OUTPATIENT_CLINIC_OR_DEPARTMENT_OTHER): Payer: Medicare Other | Attending: Hematology and Oncology | Admitting: Physical Therapy

## 2021-09-23 DIAGNOSIS — M25572 Pain in left ankle and joints of left foot: Secondary | ICD-10-CM | POA: Diagnosis present

## 2021-09-23 DIAGNOSIS — M25561 Pain in right knee: Secondary | ICD-10-CM | POA: Insufficient documentation

## 2021-09-23 DIAGNOSIS — G8929 Other chronic pain: Secondary | ICD-10-CM | POA: Insufficient documentation

## 2021-09-23 DIAGNOSIS — M25511 Pain in right shoulder: Secondary | ICD-10-CM | POA: Insufficient documentation

## 2021-09-23 DIAGNOSIS — M25571 Pain in right ankle and joints of right foot: Secondary | ICD-10-CM | POA: Insufficient documentation

## 2021-09-23 DIAGNOSIS — M25611 Stiffness of right shoulder, not elsewhere classified: Secondary | ICD-10-CM | POA: Diagnosis present

## 2021-09-23 NOTE — Therapy (Signed)
OUTPATIENT PHYSICAL THERAPY TREATMENT NOTE   Patient Name: Krystal Kelley MRN: [id number] DOB:29-Aug-1970, 51 y.o., female Today's Date: 09/23/2021  PCP: Albina Billet, MD REFERRING PROVIDER: Albina Billet, MD  END OF SESSION:   PT End of Session - 09/23/21 1531     Visit Number 6    Number of Visits 30    Date for PT Re-Evaluation 11/08/21    Authorization Type UHC MCR    PT Start Time 3734    PT Stop Time 1600    PT Time Calculation (min) 30 min    Activity Tolerance Patient tolerated treatment well    Behavior During Therapy [facility] for tasks assessed/performed              Past Medical History:  Diagnosis Date   Anxiety    Breast cancer, right breast ([place])    Depression    H/O laparoscopy    History of hysteroscopy    HSV infection    Peripheral edema    on lasix   PONV (postoperative nausea and vomiting)    Radiation 10/30/14-12/17/14   right breast 50.4 gray   Past Surgical History:  Procedure Laterality Date   BREAST BIOPSY  05/16/14   BREAST LUMPECTOMY Right 06/18/2014   bunion surgrery     CARPAL TUNNEL RELEASE Left 09/21/2016   Procedure: LEFT CARPAL TUNNEL RELEASE;  Surgeon: Daryll Brod, MD;  Location: [place];  Service: Orthopedics;  Laterality: Left;  REG/FAB   carpel tunnel     CHOLECYSTECTOMY  2015   COLONOSCOPY WITH PROPOFOL N/A 05/27/2020   Procedure: COLONOSCOPY WITH PROPOFOL;  Surgeon: Lucilla Lame, MD;  Location: [facility];  Service: Endoscopy;  Laterality: N/A;  PATIENT COVID POSITIVE 04/18/2021   HYSTERECTOMY ABDOMINAL WITH SALPINGO-OOPHORECTOMY  05/22/2018   Procedure: HYSTERECTOMY ABDOMINAL WITH BILATERAL SALPINGO-OOPHORECTOMY;  Surgeon: [name], MD;  Location: [place];  Service: Gynecology;;   PLANTAR FASCIECTOMY     PORTACATH PLACEMENT Left 06/18/2014   Procedure: INSERTION PORT-A-CATH;  Surgeon: Excell Seltzer, MD;  Location: [place];  Service: General;  Laterality: Left;    ROBOTIC ASSISTED LAPAROSCOPIC LYSIS OF ADHESION  05/22/2018   Procedure: XI ROBOTIC ASSISTED LAPAROSCOPIC LYSIS OF ADHESION;  Surgeon: [name], MD;  Location: [place];  Service: Gynecology;;   ROBOTIC ASSISTED TOTAL HYSTERECTOMY WITH BILATERAL SALPINGO OOPHERECTOMY Bilateral 05/22/2018   Procedure: ATTEMPTED XI ROBOTIC ASSISTED TOTAL HYSTERECTOMY WITH BILATERAL SALPINGO OOPHORECTOMY;  Surgeon: [name], MD;  Location: [place];  Service: Gynecology;  Laterality: Bilateral;   Patient Active Problem List   Diagnosis Date Noted   Encounter for screening colonoscopy 03/18/2020   Trigger middle finger of right hand 01/24/2019   Post-operative state 05/22/2018   Postoperative state 05/22/2018   Pain 07/13/2017   Posttraumatic stress disorder 12/09/2016   Leg edema, right 10/03/2014   Genetic testing 07/10/2014   Breast cancer of lower-inner quadrant of right female breast ([place]) 05/29/2014    REFERRING DIAG: C50.311,Z17.0 (ICD-10-CM) - Malignant neoplasm of lower-inner quadrant of right breast of female, estrogen receptor positive ([place])  THERAPY DIAG:  Chronic right shoulder pain  Chronic pain of right knee  Pain in right ankle and joints of right foot  Pain in left ankle and joints of left foot  Stiffness of right shoulder, not elsewhere classified  PERTINENT HISTORY: Lt plantar fasciectomy, bunion surgery x3  PRECAUTIONS: none  SUBJECTIVE: for the last 3 weeks my Rt knee has felt pretty  good so I have started walking. It gets sore but not pain.   PAIN:  Are you having pain? Yes  NPRS scale: mild/10 Pain location: generalized    Pain description: constant Aggravating factors: constant pain Relieving factors: moving      PRECAUTIONS: None   WEIGHT BEARING RESTRICTIONS No   FALLS:  Has patient fallen in last 6 months? No, Number of falls: 0   LIVING ENVIRONMENT: Lives with: lives with their family and Mom Lives  in: House/apartment Stairs: Yes; steps to house     OCCUPATION: not working   PLOF: Independent   PATIENT GOALS decrease pain, comfortably walk 30 min for exercise (10 right now), squatting, lift with right arm (milk jug is too heavy right now)     OBJECTIVE:  *Findings taken at [facility] unless otherwise noted.    PATIENT SURVEYS:  LEFS 24 Quick Dash 40.91                      SENSATION: Tingling reported since chemo    LE ROM:   Rests in extension greater than 0 bilaterally   LE MMT:   MMT Right 07/07/2021 Left 07/07/2021  Hip flexion      Hip extension      Hip abduction      Hip adduction      Hip internal rotation      Hip external rotation      Knee flexion      Knee extension      Ankle dorsiflexion      Ankle plantarflexion      Ankle inversion      Ankle eversion       (Blank rows = not tested)   08/13/21:   bilat shoulder flexion - full without pain   Abdct/ER: L - full, R - can reach hand behind head in scapular plane, with pain / tightness     GAIT: Distance walked: clinic <>lobby, approx 100 feet     Comments: WFL       TODAY'S TREATMENT   6/7: Marching with weight shift for hip abd activation in CKC Side stepping red tband at knees Standing from chair- use of gluts Standing glut sets Heels raises Standing gastroc stretch   08/20/21 Pt seen for aquatic therapy today.  Treatment took place in water 3.25-4.8 ft in depth at the [facility] pool. Temp of water was 91.  Pt entered/exited the pool via stairs step through pattern independently with bilat rail.  Walking forward, back and side stepping x 4 laps each Yellow noodle between between legs:  bicycle, jumping jack (with arms under water)- legs suspended Seated on 4th step: STS 5 reps x 2;  flutter kicking 20 x 2 High knee marching forward Kickboard push down (board flat on water) x 12 Kickboard push/pull (board vertical) x 12 Standing quad stretch with ankle supported by square  blue noodle x 20s x 2 each LE Standing hamstring stretch with LE on wall Pec stretch holding blue noodle behind back x 10s x 2  Pt requires buoyancy for support and to offload joints with strengthening exercises. Viscosity of the water is needed for resistance of strengthening; water current perturbations provides challenge to standing balance unsupported, requiring increased core activation.      PATIENT EDUCATION:  Education details: exercise form/rationale Person educated: Patient Education method: Explanation Education comprehension: verbalized understanding and needs further education     HOME EXERCISE PROGRAM: Find walking limit;  08/04/21 10-15 minutes    Access Code: [email or web address] URL: [email or web address]  ASSESSMENT:   CLINICAL IMPRESSION: Pt has made improvements in functional ability, exercise tolerance and reports of pain. Has begun walking more regularly for exercise and will continue to do so based on tolerance of pain. Will continue to benefit from aquatic rehab for unloaded strengthening.   OBJECTIVE IMPAIRMENTS decreased activity tolerance, decreased endurance, difficulty walking, increased muscle spasms, impaired sensation, impaired UE functional use, improper body mechanics, obesity, and pain.    ACTIVITY LIMITATIONS cleaning, community activity, driving, meal prep, laundry, and shopping.    PERSONAL FACTORS 3+ comorbidities: h/o multiple foot surgeries, h/o chemo, anxiety  are also affecting patient's functional outcome.      REHAB POTENTIAL: Good   CLINICAL DECISION MAKING: Unstable/unpredictable   EVALUATION COMPLEXITY: High     GOALS: Goals reviewed with patient? Yes   SHORT TERM GOALS: Target date: 08/04/2021   Pt will demo full AROM of Rt shoulder Baseline: demo 50% at eval Goal status: Ongoing    2.  Pt will be aware of walking limits & utilize those times to exercise without increasing significant pain Baseline: asked her to  start this at eval Goal status: achieved    3.  Pt will demo proper squat form to/from chair Baseline: very painful and compensates by leaning against chair/table at eval Goal status: achieved       LONG TERM GOALS: Target date: 11/08/2021   Able to squat appropriately to the floor pain <=3/10 Baseline: severe pain limits ability to perform this motion at eval Goal status: INITIAL   2.  Able to walk 30 min for exercise Baseline: very uncomfortable and limited to about 10 min at eval Goal status: INITIAL   3.  Able to use right arm for light functional activities without compensation Baseline: using Lt arm to help move right arm frequently, unable to lift milk jug with just right arm Goal status: INITIAL   4.  LEFS to 42 which is MDC x2 Baseline: 24 at eval Goal status: INITIAL   5.  Quick Dash to improve by 11.2% Baseline: 40.91 at eval Goal status: INITIAL           PLAN: PT FREQUENCY: 1-2x/week   PT DURATION: other: 4 months   PLANNED INTERVENTIONS: Therapeutic exercises, Therapeutic activity, Neuromuscular re-education, Balance training, Gait training, Patient/Family education, Joint mobilization, Stair training, Aquatic Therapy, Spinal mobilization, Cryotherapy, Moist heat, Taping, and Manual therapy.   PLAN FOR NEXT SESSION: Aquatics: general strengthening with extra focus on core. Hip abduction strengthening, endurance   Hawke Villalpando C. Atilla Zollner PT, DPT 09/23/21 8:29 PM

## 2021-09-24 ENCOUNTER — Ambulatory Visit: Payer: Medicare Other | Admitting: Psychologist

## 2021-09-29 ENCOUNTER — Encounter: Payer: Self-pay | Admitting: Obstetrics & Gynecology

## 2021-10-14 ENCOUNTER — Ambulatory Visit (HOSPITAL_BASED_OUTPATIENT_CLINIC_OR_DEPARTMENT_OTHER): Payer: Medicare Other | Admitting: Physical Therapy

## 2021-10-22 ENCOUNTER — Ambulatory Visit (HOSPITAL_BASED_OUTPATIENT_CLINIC_OR_DEPARTMENT_OTHER): Payer: Medicare Other | Admitting: Physical Therapy

## 2021-10-22 ENCOUNTER — Encounter: Payer: Self-pay | Admitting: Obstetrics & Gynecology

## 2021-10-22 NOTE — Telephone Encounter (Signed)
Patient was last seen 02/2021 for annual exam.

## 2021-10-23 NOTE — Telephone Encounter (Signed)
Per Rosemarie Ax: "Her voicemail was full, sent mychart message for patient to call and schedule appointment."

## 2021-10-29 ENCOUNTER — Ambulatory Visit (HOSPITAL_BASED_OUTPATIENT_CLINIC_OR_DEPARTMENT_OTHER): Payer: Self-pay | Admitting: Physical Therapy

## 2021-11-02 ENCOUNTER — Ambulatory Visit (INDEPENDENT_AMBULATORY_CARE_PROVIDER_SITE_OTHER): Payer: Medicare Other | Admitting: Podiatry

## 2021-11-02 ENCOUNTER — Encounter: Payer: Self-pay | Admitting: Podiatry

## 2021-11-02 DIAGNOSIS — M722 Plantar fascial fibromatosis: Secondary | ICD-10-CM | POA: Diagnosis not present

## 2021-11-02 MED ORDER — TRIAMCINOLONE ACETONIDE 40 MG/ML IJ SUSP
40.0000 mg | Freq: Once | INTRAMUSCULAR | Status: AC
  Administered 2021-11-02: 40 mg

## 2021-11-02 NOTE — Progress Notes (Signed)
She presents today chief complaint of Planter fasciitis bilateral.  Objective: Vital signs are stable she alert oriented x3.  Pulses are palpable.  She has moderate to severe pain on palpation medial calcaneal tubercles bilateral.  Assessment: Planter fasciitis bilateral.  Plan: Injected the bilateral heels today right was more painful than the left with Kenalog and local anesthetic.  Follow-up with her in 1 month

## 2021-11-09 ENCOUNTER — Other Ambulatory Visit: Payer: Self-pay | Admitting: Dermatology

## 2021-11-09 DIAGNOSIS — B360 Pityriasis versicolor: Secondary | ICD-10-CM

## 2021-12-14 ENCOUNTER — Ambulatory Visit (INDEPENDENT_AMBULATORY_CARE_PROVIDER_SITE_OTHER): Payer: Medicare Other | Admitting: Podiatry

## 2021-12-14 ENCOUNTER — Encounter: Payer: Self-pay | Admitting: Podiatry

## 2021-12-14 DIAGNOSIS — R600 Localized edema: Secondary | ICD-10-CM

## 2021-12-14 NOTE — Progress Notes (Signed)
She presents today for follow-up of her bilateral heels she says I think he might need some new orthotics.  States that she had really never worn the orthotics before but now recently she has been wearing them and states that it hurts the bottom aspect of her left foot.  Objective: Vital signs are stable alert oriented x3 she has no reproducible pain on palpation of the either foot however she does demonstrate pes planovalgus bilaterally.  Assessment: Planter fasciitis pes planovalgus.  Plan: Informed her that her insurance type does not pay for orthotics at all and would be $438 out-of-pocket for new orthotics.  At this point she does not want to proceed with orthotics I will follow-up with her in 1 month for an injection prior to her going to [place].

## 2021-12-24 ENCOUNTER — Ambulatory Visit (INDEPENDENT_AMBULATORY_CARE_PROVIDER_SITE_OTHER): Payer: Medicare Other | Admitting: Dermatology

## 2021-12-24 DIAGNOSIS — L219 Seborrheic dermatitis, unspecified: Secondary | ICD-10-CM

## 2021-12-24 DIAGNOSIS — L819 Disorder of pigmentation, unspecified: Secondary | ICD-10-CM

## 2021-12-24 DIAGNOSIS — B360 Pityriasis versicolor: Secondary | ICD-10-CM | POA: Diagnosis not present

## 2021-12-24 DIAGNOSIS — L299 Pruritus, unspecified: Secondary | ICD-10-CM

## 2021-12-24 MED ORDER — MOMETASONE FUROATE 0.1 % EX CREA: 1.0000 | TOPICAL_CREAM | Freq: Every day | CUTANEOUS | 11 refills | Status: DC | PRN

## 2021-12-24 MED ORDER — KETOCONAZOLE 2 % EX CREA: TOPICAL_CREAM | CUTANEOUS | 11 refills | Status: DC

## 2021-12-24 NOTE — Progress Notes (Unsigned)
   Follow-Up Visit   Subjective  Krystal Kelley is a 51 y.o. female who presents for the following: Rash (Pt c/o itchy skin on her arms, face and chest, past treatment Mometasone helped in the past. ).  The following portions of the chart were reviewed this encounter and updated as appropriate:   Tobacco  Allergies  Meds  Problems  Med Hx  Surg Hx  Fam Hx     Review of Systems:  No other skin or systemic complaints except as noted in HPI or Assessment and Plan.  Objective  Well appearing patient in no apparent distress; mood and affect are within normal limits.  A focused examination was performed including face.chest,arms. Relevant physical exam findings are noted in the Assessment and Plan.  face,neck,arms,chest Mild pink patches    Assessment & Plan  Tinea versicolor face,neck,arms,chest  Tinea versicolor and seborrheic dermatitis with pruritus and dyschromia Tinea Versicolor/Seborrheic dermatitis with pruritus    Seborrheic Dermatitis  -  is a chronic persistent rash characterized by pinkness and scaling most commonly of the mid face but also can occur on the scalp (dandruff), ears; mid chest and mid back. It tends to be exacerbated by stress and cooler weather.  People who have neurologic disease may experience new onset or exacerbation of existing seborrheic dermatitis.  The condition is not curable but treatable and can be controlled.    Cont Mometasone cream  Cont Ketoconazole 2% cream   Related Medications mometasone (ELOCON) 0.1 % cream Apply 1 application topically daily as needed (Rash).  ketoconazole (NIZORAL) 2 % cream Apply 1 application topically daily as needed for irritation (irritated/itchy skin.).   Return in about 1 year (around 12/25/2022) for Tinea versicolor, seb dermatitis .  IMarye Round, CMA, am acting as scribe for Sarina Ser, MD .  Documentation: I have reviewed the above documentation for accuracy and completeness, and I agree  with the above.  Sarina Ser, MD

## 2021-12-24 NOTE — Patient Instructions (Signed)
Due to recent changes in healthcare laws, you may see results of your pathology and/or laboratory studies on MyChart before the doctors have had a chance to review them. We understand that in some cases there may be results that are confusing or concerning to you. Please understand that not all results are received at the same time and often the doctors may need to interpret multiple results in order to provide you with the best plan of care or course of treatment. Therefore, we ask that you please give us 2 business days to thoroughly review all your results before contacting the office for clarification. Should we see a critical lab result, you will be contacted sooner.   If You Need Anything After Your Visit  If you have any questions or concerns for your doctor, please call our main line at [phone] and press option 4 to reach your doctor's medical assistant. If no one answers, please leave a voicemail as directed and we will return your call as soon as possible. Messages left after 4 pm will be answered the following business day.   You may also send us a message via MyChart. We typically respond to MyChart messages within 1-2 business days.  For prescription refills, please ask your pharmacy to contact our office. Our fax number is [phone].  If you have an urgent issue when the clinic is closed that cannot wait until the next business day, you can page your doctor at the number below.    Please note that while we do our best to be available for urgent issues outside of office hours, we are not available 24/7.   If you have an urgent issue and are unable to reach us, you may choose to seek medical care at your doctor's office, retail clinic, urgent care center, or emergency room.  If you have a medical emergency, please immediately call 911 or go to the emergency department.  Pager Numbers  - Dr. Kowalski: [phone]  - Dr. Moye: [phone]  - Dr. Stewart:  [phone]  In the event of inclement weather, please call our main line at [phone] for an update on the status of any delays or closures.  Dermatology Medication Tips: Please keep the boxes that topical medications come in in order to help keep track of the instructions about where and how to use these. Pharmacies typically print the medication instructions only on the boxes and not directly on the medication tubes.   If your medication is too expensive, please contact our office at [phone] option 4 or send us a message through MyChart.   We are unable to tell what your co-pay for medications will be in advance as this is different depending on your insurance coverage. However, we may be able to find a substitute medication at lower cost or fill out paperwork to get insurance to cover a needed medication.   If a prior authorization is required to get your medication covered by your insurance company, please allow us 1-2 business days to complete this process.  Drug prices often vary depending on where the prescription is filled and some pharmacies may offer cheaper prices.  The website www.goodrx.com contains coupons for medications through different pharmacies. The prices here do not account for what the cost may be with help from insurance (it may be cheaper with your insurance), but the website can give you the price if you did not use any insurance.  - You can print the associated coupon and take it with   your prescription to the pharmacy.  - You may also stop by our office during regular business hours and pick up a GoodRx coupon card.  - If you need your prescription sent electronically to a different pharmacy, notify our office through [facility] MyChart or by phone at [phone] option 4.     Si Usted Necesita Algo Despus de Su Visita  Tambin puede enviarnos un mensaje a travs de MyChart. Por lo general respondemos a los mensajes de MyChart en el transcurso de 1 a 2  das hbiles.  Para renovar recetas, por favor pida a su farmacia que se ponga en contacto con nuestra oficina. Nuestro nmero de fax es el [phone].  Si tiene un asunto urgente cuando la clnica est cerrada y que no puede esperar hasta el siguiente da hbil, puede llamar/localizar a su doctor(a) al nmero que aparece a continuacin.   Por favor, tenga en cuenta que aunque hacemos todo lo posible para estar disponibles para asuntos urgentes fuera del horario de oficina, no estamos disponibles las 24 horas del da, los 7 das de la semana.   Si tiene un problema urgente y no puede comunicarse con nosotros, puede optar por buscar atencin mdica  en el consultorio de su doctor(a), en una clnica privada, en un centro de atencin urgente o en una sala de emergencias.  Si tiene una emergencia mdica, por favor llame inmediatamente al 911 o vaya a la sala de emergencias.  Nmeros de bper  - Dr. Kowalski: [phone]  - Dra. Moye: [phone]  - Dra. Stewart: [phone]  En caso de inclemencias del tiempo, por favor llame a nuestra lnea principal al [phone] para una actualizacin sobre el estado de cualquier retraso o cierre.  Consejos para la medicacin en dermatologa: Por favor, guarde las cajas en las que vienen los medicamentos de uso tpico para ayudarle a seguir las instrucciones sobre dnde y cmo usarlos. Las farmacias generalmente imprimen las instrucciones del medicamento slo en las cajas y no directamente en los tubos del medicamento.   Si su medicamento es muy caro, por favor, pngase en contacto con nuestra oficina llamando al [phone] y presione la opcin 4 o envenos un mensaje a travs de MyChart.   No podemos decirle cul ser su copago por los medicamentos por adelantado ya que esto es diferente dependiendo de la cobertura de su seguro. Sin embargo, es posible que podamos encontrar un medicamento sustituto a menor costo o llenar un formulario para que el  seguro cubra el medicamento que se considera necesario.   Si se requiere una autorizacin previa para que su compaa de seguros cubra su medicamento, por favor permtanos de 1 a 2 das hbiles para completar este proceso.  Los precios de los medicamentos varan con frecuencia dependiendo del lugar de dnde se surte la receta y alguna farmacias pueden ofrecer precios ms baratos.  El sitio web www.goodrx.com tiene cupones para medicamentos de diferentes farmacias. Los precios aqu no tienen en cuenta lo que podra costar con la ayuda del seguro (puede ser ms barato con su seguro), pero el sitio web puede darle el precio si no utiliz ningn seguro.  - Puede imprimir el cupn correspondiente y llevarlo con su receta a la farmacia.  - Tambin puede pasar por nuestra oficina durante el horario de atencin regular y recoger una tarjeta de cupones de GoodRx.  - Si necesita que su receta se enve electrnicamente a una farmacia diferente, informe a nuestra oficina a travs de MyChart de [facility]   o por telfono llamando al [phone] y presione la opcin 4.  

## 2021-12-25 ENCOUNTER — Encounter: Payer: Self-pay | Admitting: Emergency Medicine

## 2021-12-25 ENCOUNTER — Emergency Department
Admission: EM | Admit: 2021-12-25 | Discharge: 2021-12-25 | Disposition: A | Discharge status: Home / Self Care | Payer: Medicare Other | Attending: Emergency Medicine | Admitting: Emergency Medicine

## 2021-12-25 ENCOUNTER — Other Ambulatory Visit: Payer: Self-pay

## 2021-12-25 ENCOUNTER — Emergency Department: Payer: Medicare Other

## 2021-12-25 DIAGNOSIS — E876 Hypokalemia: Secondary | ICD-10-CM | POA: Diagnosis not present

## 2021-12-25 DIAGNOSIS — M7121 Synovial cyst of popliteal space [Baker], right knee: Secondary | ICD-10-CM | POA: Insufficient documentation

## 2021-12-25 DIAGNOSIS — R2243 Localized swelling, mass and lump, lower limb, bilateral: Secondary | ICD-10-CM | POA: Diagnosis present

## 2021-12-25 DIAGNOSIS — Z9011 Acquired absence of right breast and nipple: Secondary | ICD-10-CM | POA: Insufficient documentation

## 2021-12-25 DIAGNOSIS — Z853 Personal history of malignant neoplasm of breast: Secondary | ICD-10-CM | POA: Insufficient documentation

## 2021-12-25 DIAGNOSIS — R000 Tachycardia, unspecified: Secondary | ICD-10-CM | POA: Diagnosis not present

## 2021-12-25 DIAGNOSIS — R600 Localized edema: Secondary | ICD-10-CM | POA: Diagnosis not present

## 2021-12-25 LAB — COMPREHENSIVE METABOLIC PANEL
ALT: 26 U/L (ref 0–44)
AST: 27 U/L (ref 15–41)
Albumin: 3.7 g/dL (ref 3.5–5.0)
Alkaline Phosphatase: 60 U/L (ref 38–126)
Anion gap: 7 (ref 5–15)
BUN: 15 mg/dL (ref 6–20)
CO2: 29 mmol/L (ref 22–32)
Calcium: 9 mg/dL (ref 8.9–10.3)
Chloride: 103 mmol/L (ref 98–111)
Creatinine, Ser: 1.04 mg/dL — ABNORMAL HIGH (ref 0.44–1.00)
GFR, Estimated: >60 mL/min (ref >60)
Glucose, Bld: 102 mg/dL — ABNORMAL HIGH (ref 70–99)
Potassium: 3.2 mmol/L — ABNORMAL LOW (ref 3.5–5.1)
Sodium: 139 mmol/L (ref 135–145)
Total Bilirubin: 0.7 mg/dL (ref 0.3–1.2)
Total Protein: 7.5 g/dL (ref 6.5–8.1)

## 2021-12-25 LAB — CBC
HCT: 38 % (ref 36.0–46.0)
Hemoglobin: 12.5 g/dL (ref 12.0–15.0)
MCH: 30.9 pg (ref 26.0–34.0)
MCHC: 32.9 g/dL (ref 30.0–36.0)
MCV: 93.8 fL (ref 80.0–100.0)
Platelets: 338 10*3/uL (ref 150–400)
RBC: 4.05 MIL/uL (ref 3.87–5.11)
RDW: 13.6 % (ref 11.5–15.5)
WBC: 10.5 10*3/uL (ref 4.0–10.5)
nRBC: 0 % (ref 0.0–0.2)

## 2021-12-25 LAB — BRAIN NATRIURETIC PEPTIDE: B Natriuretic Peptide: 5 pg/mL (ref 0.0–100.0)

## 2021-12-25 LAB — TROPONIN I (HIGH SENSITIVITY): Troponin I (High Sensitivity): 8 ng/L (ref <18)

## 2021-12-25 MED ORDER — FUROSEMIDE 20 MG PO TABS: 20.0000 mg | ORAL_TABLET | Freq: Every day | ORAL | 0 refills | Status: DC

## 2021-12-25 MED ORDER — POTASSIUM CHLORIDE CRYS ER 20 MEQ PO TBCR
40.0000 meq | EXTENDED_RELEASE_TABLET | Freq: Once | ORAL | Status: AC
  Administered 2021-12-25: 40 meq via ORAL
  Filled 2021-12-25: qty 2

## 2021-12-25 NOTE — ED Provider Notes (Signed)
[facility] Provider Note    Event Date/Time   First MD Initiated Contact with Patient 12/25/21 0140     (approximate)   History   Leg Swelling   HPI  Krystal Kelley is a 51 y.o. female who presents to the ED from home with a chief complaint of bilateral lower leg swelling for weeks but worse over the last couple days.  Patient with a history of breast cancer on oral chemotherapy, used to be on Lasix but currently on HCTZ.  Acknowledges she has gained weight and that is part of her BLE swelling.  Unable to see her PCP today so presents for further evaluation in the ED.  Denies associated fever, cough, chest pain, shortness of breath, abdominal pain, nausea, vomiting or dizziness.  Does admit she has had some dietary indiscretions and has been driving in her car locally more often lately.     Past Medical History   Past Medical History:  Diagnosis Date   Anxiety    Breast cancer, right breast ([place])    Depression    H/O laparoscopy    History of hysteroscopy    HSV infection    Peripheral edema    on lasix   PONV (postoperative nausea and vomiting)    Radiation 10/30/14-12/17/14   right breast 50.4 gray     Active Problem List   Patient Active Problem List   Diagnosis Date Noted   Encounter for screening colonoscopy 03/18/2020   Trigger middle finger of right hand 01/24/2019   Post-operative state 05/22/2018   Postoperative state 05/22/2018   Pain 07/13/2017   Posttraumatic stress disorder 12/09/2016   Leg edema, right 10/03/2014   Genetic testing 07/10/2014   Breast cancer of lower-inner quadrant of right female breast ([place]) 05/29/2014     Past Surgical History   Past Surgical History:  Procedure Laterality Date   BREAST BIOPSY  05/16/14   BREAST LUMPECTOMY Right 06/18/2014   bunion surgrery     CARPAL TUNNEL RELEASE Left 09/21/2016   Procedure: LEFT CARPAL TUNNEL RELEASE;  Surgeon: Daryll Brod, MD;  Location: [facility];  Service: Orthopedics;  Laterality: Left;  REG/FAB   carpel tunnel     CHOLECYSTECTOMY  2015   COLONOSCOPY WITH PROPOFOL N/A 05/27/2020   Procedure: COLONOSCOPY WITH PROPOFOL;  Surgeon: Lucilla Lame, MD;  Location: [facility] ENDOSCOPY;  Service: Endoscopy;  Laterality: N/A;  PATIENT COVID POSITIVE 04/18/2021   HYSTERECTOMY ABDOMINAL WITH SALPINGO-OOPHORECTOMY  05/22/2018   Procedure: HYSTERECTOMY ABDOMINAL WITH BILATERAL SALPINGO-OOPHORECTOMY;  Surgeon: [name], MD;  Location: [facility];  Service: Gynecology;;   PLANTAR FASCIECTOMY     PORTACATH PLACEMENT Left 06/18/2014   Procedure: INSERTION PORT-A-CATH;  Surgeon: Excell Seltzer, MD;  Location: [facility];  Service: General;  Laterality: Left;   ROBOTIC ASSISTED LAPAROSCOPIC LYSIS OF ADHESION  05/22/2018   Procedure: XI ROBOTIC ASSISTED LAPAROSCOPIC LYSIS OF ADHESION;  Surgeon: [name], MD;  Location: [facility];  Service: Gynecology;;   ROBOTIC ASSISTED TOTAL HYSTERECTOMY WITH BILATERAL SALPINGO OOPHERECTOMY Bilateral 05/22/2018   Procedure: ATTEMPTED XI ROBOTIC ASSISTED TOTAL HYSTERECTOMY WITH BILATERAL SALPINGO OOPHORECTOMY;  Surgeon: [name], MD;  Location: [facility];  Service: Gynecology;  Laterality: Bilateral;     Home Medications   Prior to Admission medications   Medication Sig Start Date End Date Taking? Authorizing Provider  furosemide (LASIX) 20 MG tablet Take 1 tablet (20 mg total) by mouth daily for 3  days. 12/25/21 12/28/21 Yes Paulette Blanch, MD  anastrozole (ARIMIDEX) 1 MG tablet Take 1 tablet (1 mg total) by mouth daily. 06/02/21   Nicholas Lose, MD  Calcium Carbonate (CALCIUM 500 PO) Take by mouth.    [provider]  cephALEXin (KEFLEX) 500 MG capsule Take 1 capsule (500 mg total) by mouth 3 (three) times daily. 09/02/21   Hyatt, Max T, DPM  cycloSPORINE (RESTASIS) 0.05 % ophthalmic emulsion Place 1 drop into both eyes 2  (two) times daily.    [provider]  Eluxadoline (VIBERZI) 100 MG TABS Take 1 tablet (100 mg total) by mouth 2 (two) times daily. 01/18/17   Lucilla Lame, MD  EUCRISA 2 % OINT  01/04/19   [provider]  fluticasone (FLONASE) 50 MCG/ACT nasal spray Place 1 spray into both nostrils daily as needed for allergies.    [provider]  hydrochlorothiazide (HYDRODIURIL) 25 MG tablet Take 25 mg by mouth every morning. 12/05/20   [provider]  ketoconazole (NIZORAL) 2 % cream Apply 1 application topically daily as needed for irritation (irritated/itchy skin.). 05/28/20   Ralene Bathe, MD  melatonin 5 MG TABS Take 5 mg by mouth.    [provider]  mometasone (ELOCON) 0.1 % cream Apply 1 application topically daily as needed (Rash). 05/28/20   Ralene Bathe, MD  Multiple Vitamin (MULTIVITAMIN WITH MINERALS) TABS tablet Take 1 tablet by mouth daily. One-A-Day Multivitamin     [provider]  phentermine 37.5 MG capsule TAKE 1 CAPSULE(37.5 MG) BY MOUTH EVERY MORNING 06/10/21   [name], MD  traMADol (ULTRAM) 50 MG tablet Take 50 mg by mouth 4 (four) times daily as needed. 05/19/21   [provider]  valACYclovir (VALTREX) 500 MG tablet Take 500 mg by mouth 2 (two) times daily as needed (outbreaks.).     [provider]  vitamin C (ASCORBIC ACID) 500 MG tablet Take 500 mg by mouth daily.     [provider]  Zinc Sulfate (ZINC 15 PO) Take by mouth.    [provider]     Allergies  Tamoxifen   Family History   Family History  Problem Relation Age of Onset   Kidney cancer Father        kidney   Prostate cancer Maternal Uncle        deceased 29   Lung cancer Maternal Grandfather        deceased 87; smoker   Colon cancer Paternal Grandfather        deceased 48s   Diabetes Other    Stroke Other    Hyperlipidemia Other    Hypertension Other    COPD Other    Cancer Other        pat  half-sister; ? uterine vs. ovarian ca     Physical Exam  Triage Vital Signs: ED Triage Vitals  Enc Vitals Group     BP 12/25/21 0137 (!) 150/72     Pulse Rate 12/25/21 0137 (!) 110     Resp 12/25/21 0137 16     Temp 12/25/21 0137 98.5 F (36.9 C)     Temp Source 12/25/21 0137 Oral     SpO2 12/25/21 0137 99 %     Weight 12/25/21 0135 280 lb (127 kg)     Height 12/25/21 0135 '5\' 4"'$  (1.626 m)     Head Circumference --      Peak Flow --      Pain Score  12/25/21 0134 0     Pain Loc --      Pain Edu? --      Excl. in [place]? --     Updated Vital Signs: BP 128/82   Pulse 93   Temp 98.5 F (36.9 C) (Oral)   Resp 19   Ht '5\' 4"'$  (1.626 m)   Wt 127 kg   LMP 03/21/2018   SpO2 100%   BMI 48.06 kg/m    General: Awake, no distress.  CV:  Mildly tachycardic.  Good peripheral perfusion.  Resp:  Normal effort.  CTA B. Abd:  Obese; nontender.  No distention.  Other:  1+ BLE pitting edema, right leg more swollen than left which patient states is baseline due to prior breast surgery.   ED Results / Procedures / Treatments  Labs (all labs ordered are listed, but only abnormal results are displayed) Labs Reviewed  COMPREHENSIVE METABOLIC PANEL - Abnormal; Notable for the following components:      Result Value   Potassium 3.2 (*)    Glucose, Bld 102 (*)    Creatinine, Ser 1.04 (*)    All other components within normal limits  CBC  BRAIN NATRIURETIC PEPTIDE  TROPONIN I (HIGH SENSITIVITY)     EKG  ED ECG REPORT I, Laraya Pestka J, the attending physician, personally viewed and interpreted this ECG.   Date: 12/25/2021  EKG Time: 0152  Rate: 99  Rhythm: normal sinus rhythm  Axis: Normal  Intervals:none  ST&T Change: Nonspecific    RADIOLOGY I have independently visualized and interpreted patient's chest x-ray and ultrasound as well as noted the radiology interpretation:  Chest x-ray: Possible mild edema, no focal consolidation  BLE ultrasound: Negative for DVT, right  Baker's cyst  Official radiology report(s): US Venous Img Lower Bilateral (DVT)  Result Date: 12/25/2021 CLINICAL DATA:  Bilateral leg swelling EXAM: BILATERAL LOWER EXTREMITY VENOUS DOPPLER ULTRASOUND TECHNIQUE: Gray-scale sonography with compression, as well as color and duplex ultrasound, were performed to evaluate the deep venous system(s) from the level of the common femoral vein through the popliteal and proximal calf veins. COMPARISON:  None Available. FINDINGS: VENOUS Normal compressibility of the common femoral, superficial femoral, and popliteal veins, as well as the visualized calf veins. Visualized portions of profunda femoral vein and great saphenous vein unremarkable. No filling defects to suggest DVT on grayscale or color Doppler imaging. Doppler waveforms show normal direction of venous flow, normal respiratory plasticity and response to augmentation. Limited views of the contralateral common femoral vein are unremarkable. OTHER Fluid collection in the right popliteal fossa, most likely a Baker's cyst. Measures 6.2 x 1.3 x 3.3 cm. Limitations: none IMPRESSION: Negative for DVT in the bilateral lower extremities. Electronically Signed   By: Merilyn Baba M.D.   On: 12/25/2021 03:37   DG Chest 2 View  Result Date: 12/25/2021 CLINICAL DATA:  Bilateral lower extremity edema. EXAM: CHEST - 2 VIEW COMPARISON:  Chest radiograph dated 06/18/2014. FINDINGS: Mild mid to lower lung field hazy densities may represent mild edema. No focal consolidation, pleural effusion, pneumothorax. The cardiac silhouette is within limits. No acute osseous pathology. Degenerative changes of the spine. IMPRESSION: No focal consolidation.  Possible mild edema. Electronically Signed   By: Anner Crete M.D.   On: 12/25/2021 02:39     PROCEDURES:  Critical Care performed: No  .1-3 Lead EKG Interpretation  Performed by: Paulette Blanch, MD Authorized by: Paulette Blanch, MD     Interpretation: abnormal     ECG  rate:   110   ECG rate assessment: tachycardic     Rhythm: sinus tachycardia     Ectopy: none     Conduction: normal   Comments:     Patient placed on cardiac monitor to evaluate for arrhythmias    MEDICATIONS ORDERED IN ED: Medications  potassium chloride SA (KLOR-CON M) CR tablet 40 mEq (has no administration in time range)     IMPRESSION / MDM / ASSESSMENT AND PLAN / ED COURSE  I reviewed the triage vital signs and the nursing notes.                             51 year old female presenting with BLE swelling.  Differential diagnosis includes but is not limited to peripheral edema, CHF, DVT, etc.  I have personally reviewed patient's records and note a podiatry office visit from 12/14/2021 addressing lower extremity edema.  Patient's presentation is most consistent with acute presentation with potential threat to life or bodily function.  The patient is on the cardiac monitor to evaluate for evidence of arrhythmia and/or significant heart rate changes.  We will obtain cardiac panel, chest x-ray, BLE Doppler ultrasounds to evaluate for DVT.  Will place IV; consider CTA chest to evaluate for PE if DVT study is positive.  Clinical Course as of 12/25/21 0352  Fri Dec 25, 2021  0342 Updated patient on laboratory results remarkable for mild hypokalemia potassium 3.2, normal BNP and troponin, unremarkable chest x-ray and ultrasound negative for DVT.  We will replete potassium orally, placed on Lasix 20 mg x 3 days and patient will follow-up closely with her PCP.  Strict return precautions given.  Patient verbalizes understanding and agrees with plan of care. [JS]    Clinical Course User Index [JS] Paulette Blanch, MD     FINAL CLINICAL IMPRESSION(S) / ED DIAGNOSES   Final diagnoses:  Bilateral lower extremity edema  Hypokalemia  Baker cyst, right     Rx / DC Orders   ED Discharge Orders          Ordered    furosemide (LASIX) 20 MG tablet  Daily        12/25/21 0344              Note:  This document was prepared using Dragon voice recognition software and may include unintentional dictation errors.   Paulette Blanch, MD 12/25/21 [phone]

## 2021-12-25 NOTE — ED Notes (Signed)
Dr. Sung at bedside.  

## 2021-12-25 NOTE — Discharge Instructions (Addendum)
Take Lasix 20 mg daily x3 days.  Elevate legs whenever possible and also during sleep.  Return to the ER for worsening symptoms, persistent vomiting, difficulty breathing or other concerns.

## 2021-12-25 NOTE — ED Triage Notes (Signed)
Pt to ED from home c/o bilateral leg swelling for weeks but worse last couple days.  Denies SOB or pain, just tightness in the legs.  Pt A&Ox4, chest rise even and unlabored, and in NAD at this time.

## 2021-12-29 ENCOUNTER — Encounter: Payer: Self-pay | Admitting: Dermatology

## 2022-01-06 ENCOUNTER — Ambulatory Visit: Payer: Medicare Other | Admitting: Obstetrics & Gynecology

## 2022-01-13 ENCOUNTER — Ambulatory Visit (INDEPENDENT_AMBULATORY_CARE_PROVIDER_SITE_OTHER): Payer: Medicare Other | Admitting: Podiatry

## 2022-01-13 ENCOUNTER — Encounter: Payer: Self-pay | Admitting: Podiatry

## 2022-01-13 DIAGNOSIS — M722 Plantar fascial fibromatosis: Secondary | ICD-10-CM | POA: Diagnosis not present

## 2022-01-13 MED ORDER — TRIAMCINOLONE ACETONIDE 40 MG/ML IJ SUSP
40.0000 mg | Freq: Once | INTRAMUSCULAR | Status: AC
  Administered 2022-01-13: 40 mg

## 2022-01-13 NOTE — Progress Notes (Signed)
She presents today for follow-up of her Planter fasciitis states that she would like to get this feeling better before she leaves for [place] by the end of next month.  Objective: Vital signs are stable she is alert oriented x3 her left foot is really painful to palpation MucoClear tubercle of the left heel.  Less so on the right foot but still prominent.  Assessment: Planter fasciitis left greater than right.  Plan: Injected 20 mg Kenalog 5 mg Marcaine point maximal tenderness of the left heel.  I injected 10 mg to the right heel.  Follow-up with me on an as-needed basis or before she leaves for her trip.

## 2022-02-03 ENCOUNTER — Ambulatory Visit (INDEPENDENT_AMBULATORY_CARE_PROVIDER_SITE_OTHER): Payer: Medicare Other | Admitting: Podiatry

## 2022-02-03 DIAGNOSIS — M722 Plantar fascial fibromatosis: Secondary | ICD-10-CM

## 2022-02-03 MED ORDER — TRIAMCINOLONE ACETONIDE 40 MG/ML IJ SUSP
40.0000 mg | Freq: Once | INTRAMUSCULAR | Status: AC
  Administered 2022-02-03: 40 mg

## 2022-02-03 NOTE — Progress Notes (Signed)
She presents today for follow-up of her Planter fasciitis she states that is really doing pretty good for a few days but and getting married on a trip and advised to make sure there is no to be bothersome.  Objective: Vital signs stable she alert and oriented x3.  Pulses are palpable.  She has pain on palpation medial calcaneal tubercles bilateral.  Assessment: Plan fasciitis bilateral.  Plan: I injected 10 mg Kenalog 5 mg Marcaine point maximal tenderness bilateral foot.

## 2022-02-25 ENCOUNTER — Other Ambulatory Visit: Payer: Self-pay | Admitting: Hematology and Oncology

## 2022-03-08 ENCOUNTER — Encounter: Payer: Self-pay | Admitting: Podiatry

## 2022-03-08 ENCOUNTER — Ambulatory Visit (INDEPENDENT_AMBULATORY_CARE_PROVIDER_SITE_OTHER): Payer: Medicare Other | Admitting: Podiatry

## 2022-03-08 DIAGNOSIS — M722 Plantar fascial fibromatosis: Secondary | ICD-10-CM

## 2022-03-08 NOTE — Progress Notes (Signed)
She presents today for follow-up of her Planter fasciitis.  She states that she is doing well and has not had any pain recently.  She states she is trying to lose weight states that she feels that this may help her more than anything else.  Objective: Vital signs are stable she is alert and oriented x3 pulses are palpable no reproducible pain on palpation.  Assessment plan fasciitis resolving.  Plan: Follow-up with me in about 6 weeks.

## 2022-03-10 ENCOUNTER — Ambulatory Visit: Payer: Medicare Other | Admitting: Obstetrics & Gynecology

## 2022-03-10 DIAGNOSIS — Z0289 Encounter for other administrative examinations: Secondary | ICD-10-CM

## 2022-03-24 ENCOUNTER — Ambulatory Visit (INDEPENDENT_AMBULATORY_CARE_PROVIDER_SITE_OTHER): Payer: Medicare Other | Admitting: Obstetrics & Gynecology

## 2022-03-24 ENCOUNTER — Encounter: Payer: Self-pay | Admitting: Obstetrics & Gynecology

## 2022-03-24 ENCOUNTER — Other Ambulatory Visit (HOSPITAL_COMMUNITY)
Admission: RE | Admit: 2022-03-24 | Discharge: 2022-03-24 | Disposition: A | Discharge status: Home / Self Care | Payer: Medicare Other | Source: Ambulatory Visit | Attending: Obstetrics & Gynecology | Admitting: Obstetrics & Gynecology

## 2022-03-24 VITALS — BP 110/72 | HR 98 | Ht 64.25 in | Wt 300.0 lb

## 2022-03-24 DIAGNOSIS — Z9289 Personal history of other medical treatment: Secondary | ICD-10-CM | POA: Diagnosis not present

## 2022-03-24 DIAGNOSIS — Z9189 Other specified personal risk factors, not elsewhere classified: Secondary | ICD-10-CM

## 2022-03-24 DIAGNOSIS — Z90710 Acquired absence of both cervix and uterus: Secondary | ICD-10-CM

## 2022-03-24 DIAGNOSIS — Z1151 Encounter for screening for human papillomavirus (HPV): Secondary | ICD-10-CM | POA: Insufficient documentation

## 2022-03-24 DIAGNOSIS — Z1272 Encounter for screening for malignant neoplasm of vagina: Secondary | ICD-10-CM | POA: Insufficient documentation

## 2022-03-24 DIAGNOSIS — B009 Herpesviral infection, unspecified: Secondary | ICD-10-CM

## 2022-03-24 DIAGNOSIS — Z01419 Encounter for gynecological examination (general) (routine) without abnormal findings: Secondary | ICD-10-CM

## 2022-03-24 DIAGNOSIS — Z853 Personal history of malignant neoplasm of breast: Secondary | ICD-10-CM

## 2022-03-24 DIAGNOSIS — Z90722 Acquired absence of ovaries, bilateral: Secondary | ICD-10-CM

## 2022-03-24 NOTE — Progress Notes (Signed)
Krystal Kelley [birth year] [id number]   History:    51 y.o. G0 Single   RP: Established patient presenting for annual gyn exam    HPI: S/P TAH/BSO in 05/2018.  No pelvic pain.  Abstinent x last Pap test 12/2018 which was negative/HPV HR neg.  H/O Rt Breast Cancer.  No longer on Anastrozole, followed by Dr Lindi Adie.  Breasts normal. Screening mammo 09/2021 Benign.  Urine normal.  BMs normal. BMI increased to 51.09. Refering to Nutritionist. Fasting Health labs here today.  Colono 05/2020.   Past medical history,surgical history, family history and social history were all reviewed and documented in the EPIC chart.  Gynecologic History Patient's last menstrual period was 03/21/2018.  Obstetric History OB History  Gravida Para Term Preterm AB Living  0 0 0 0 0 0  SAB IAB Ectopic Multiple Live Births  0 0 0 0 0     ROS: A ROS was performed and pertinent positives and negatives are included in the history. GENERAL: No fevers or chills. HEENT: No change in vision, no earache, sore throat or sinus congestion. NECK: No pain or stiffness. CARDIOVASCULAR: No chest pain or pressure. No palpitations. PULMONARY: No shortness of breath, cough or wheeze. GASTROINTESTINAL: No abdominal pain, nausea, vomiting or diarrhea, melena or bright red blood per rectum. GENITOURINARY: No urinary frequency, urgency, hesitancy or dysuria. MUSCULOSKELETAL: No joint or muscle pain, no back pain, no recent trauma. DERMATOLOGIC: No rash, no itching, no lesions. ENDOCRINE: No polyuria, polydipsia, no heat or cold intolerance. No recent change in weight. HEMATOLOGICAL: No anemia or easy bruising or bleeding. NEUROLOGIC: No headache, seizures, numbness, tingling or weakness. PSYCHIATRIC: No depression, no loss of interest in normal activity or change in sleep pattern.     Exam:   BP 110/72   Pulse 98   Ht 5' 4.25" (1.632 m)   Wt 300 lb (136.1 kg)   LMP 03/21/2018   SpO2 99%   BMI 51.09 kg/m   Body mass index is  51.09 kg/m.  General appearance : Well developed well nourished female. No acute distress HEENT: Eyes: no retinal hemorrhage or exudates,  Neck supple, trachea midline, no carotid bruits, no thyroidmegaly Lungs: Clear to auscultation, no rhonchi or wheezes, or rib retractions  Heart: Regular rate and rhythm, no murmurs or gallops Breast:Examined in sitting and supine position were symmetrical in appearance, no palpable masses or tenderness,  no skin retraction, no nipple inversion, no nipple discharge, no skin discoloration, no axillary or supraclavicular lymphadenopathy Abdomen: no palpable masses or tenderness, no rebound or guarding Extremities: no edema or skin discoloration or tenderness  Pelvic: Vulva: Normal             Vagina: No gross lesions or discharge  Cervix/Uterus absent  Adnexa  Without masses or tenderness  Anus: Normal   Assessment/Plan:  51 y.o. female for annual exam   1. Encounter for Papanicolaou smear of vagina as part of routine gynecological examination S/P TAH/BSO in 05/2018.  No pelvic pain.  Abstinent x last Pap test 12/2018 which was negative/HPV HR neg.  H/O Rt Breast Cancer.  No longer on Anastrozole, followed by Dr Lindi Adie.  Breasts normal. Screening mammo 09/2021 Benign.  Urine normal.  BMs normal. BMI increased to 51.09. Refering to Nutritionist. Fasting Health labs here today.  [place] 05/2020. - Lipid Profile - CBC - Comp Met (CMET) - TSH - Vitamin D (25 hydroxy) - Cytology - PAP( [place])  2. S/P TAH-BSO  3. Personal history of  breast cancer Stopped Anastrasole.  4. Morbid obesity ([place]) Refer to [facility].  5. Personal history of other medical treatment   [name] MD, 12:29 PM

## 2022-03-25 ENCOUNTER — Other Ambulatory Visit: Payer: Self-pay | Admitting: *Deleted

## 2022-03-25 LAB — LIPID PANEL
Cholesterol: 195 mg/dL (ref <200)
HDL: 78 mg/dL (ref >=50)
LDL Cholesterol (Calc): 98 mg/dL (calc)
Non-HDL Cholesterol (Calc): 117 mg/dL (calc) (ref <130)
Total CHOL/HDL Ratio: 2.5 (calc) (ref <5.0)
Triglycerides: 92 mg/dL (ref <150)

## 2022-03-25 LAB — COMPREHENSIVE METABOLIC PANEL
AG Ratio: 1.3 (calc) (ref 1.0–2.5)
ALT: 34 U/L — ABNORMAL HIGH (ref 6–29)
AST: 27 U/L (ref 10–35)
Albumin: 4.1 g/dL (ref 3.6–5.1)
Alkaline phosphatase (APISO): 76 U/L (ref 37–153)
BUN: 11 mg/dL (ref 7–25)
CO2: 25 mmol/L (ref 20–32)
Calcium: 9.4 mg/dL (ref 8.6–10.4)
Chloride: 103 mmol/L (ref 98–110)
Creat: 0.86 mg/dL (ref 0.50–1.03)
Globulin: 3.2 g/dL (calc) (ref 1.9–3.7)
Glucose, Bld: 97 mg/dL (ref 65–99)
Potassium: 4.5 mmol/L (ref 3.5–5.3)
Sodium: 142 mmol/L (ref 135–146)
Total Bilirubin: 0.3 mg/dL (ref 0.2–1.2)
Total Protein: 7.3 g/dL (ref 6.1–8.1)

## 2022-03-25 LAB — CBC
HCT: 40.1 % (ref 35.0–45.0)
Hemoglobin: 13.7 g/dL (ref 11.7–15.5)
MCH: 31.9 pg (ref 27.0–33.0)
MCHC: 34.2 g/dL (ref 32.0–36.0)
MCV: 93.5 fL (ref 80.0–100.0)
MPV: 10.1 fL (ref 7.5–12.5)
Platelets: 353 10*3/uL (ref 140–400)
RBC: 4.29 10*6/uL (ref 3.80–5.10)
RDW: 13.5 % (ref 11.0–15.0)
WBC: 7.5 10*3/uL (ref 3.8–10.8)

## 2022-03-25 LAB — VITAMIN D 25 HYDROXY (VIT D DEFICIENCY, FRACTURES): Vit D, 25-Hydroxy: 16 ng/mL — ABNORMAL LOW (ref 30–100)

## 2022-03-25 LAB — TSH: TSH: 1.93 mIU/L

## 2022-03-30 LAB — CYTOLOGY - PAP
Comment: NEGATIVE
Diagnosis: NEGATIVE
High risk HPV: NEGATIVE

## 2022-04-05 ENCOUNTER — Ambulatory Visit: Payer: Medicare Other | Admitting: Dermatology

## 2022-04-08 ENCOUNTER — Telehealth: Payer: Self-pay

## 2022-04-08 ENCOUNTER — Other Ambulatory Visit: Payer: Self-pay

## 2022-04-08 DIAGNOSIS — E559 Vitamin D deficiency, unspecified: Secondary | ICD-10-CM

## 2022-04-08 MED ORDER — VITAMIN D (ERGOCALCIFEROL) 1.25 MG (50000 UNIT) PO CAPS: 50000.0000 [IU] | ORAL_CAPSULE | ORAL | 0 refills | Status: AC

## 2022-04-08 NOTE — Telephone Encounter (Signed)
When calling pt about results/recommendations from recent visit, she wanted me to notify you that visits with nutrition is not covered under her insurance plan. Also reports that her PCP is about to retire and will not prescribe her anything that she requests. Is wondering if you would consider prescribing her phentermine? Please advise.

## 2022-04-14 ENCOUNTER — Ambulatory Visit
Admission: EM | Admit: 2022-04-14 | Discharge: 2022-04-14 | Disposition: A | Discharge status: Home / Self Care | Payer: Medicare Other | Attending: Urgent Care | Admitting: Urgent Care

## 2022-04-14 DIAGNOSIS — B9789 Other viral agents as the cause of diseases classified elsewhere: Secondary | ICD-10-CM | POA: Diagnosis not present

## 2022-04-14 DIAGNOSIS — J019 Acute sinusitis, unspecified: Secondary | ICD-10-CM | POA: Diagnosis not present

## 2022-04-14 DIAGNOSIS — J069 Acute upper respiratory infection, unspecified: Secondary | ICD-10-CM | POA: Diagnosis not present

## 2022-04-14 MED ORDER — HYDROCOD POLI-CHLORPHE POLI ER 10-8 MG/5ML PO SUER: 5.0000 mL | Freq: Two times a day (BID) | ORAL | 0 refills | Status: AC | PRN

## 2022-04-14 MED ORDER — BENZONATATE 100 MG PO CAPS: ORAL_CAPSULE | ORAL | 0 refills | Status: DC

## 2022-04-14 NOTE — ED Provider Notes (Signed)
Krystal Kelley    CSN: [id number] Arrival date & time: 04/14/22  2951      History   Chief Complaint No chief complaint on file.   HPI Krystal Kelley is a 51 y.o. female.   HPI  This to urgent care with complaint of cough, nasal drainage, nasal congestion and postnasal drip starting 3 days ago.  She complains of sinus pressure and popping in her ears as well as cough.  She states she is using Sudafed which has helped somewhat.  She requests an antibiotic.  Past Medical History:  Diagnosis Date   Anxiety    Breast cancer, right breast (Krystal Kelley) 2016   Depression    H/O laparoscopy    History of hysteroscopy    HSV infection    Peripheral edema    on lasix   PONV (postoperative nausea and vomiting)    Radiation 10/30/14-12/17/14   right breast 50.4 gray    Patient Active Problem List   Diagnosis Date Noted   Encounter for screening colonoscopy 03/18/2020   Trigger middle finger of right hand 01/24/2019   Post-operative state 05/22/2018   Postoperative state 05/22/2018   Pain 07/13/2017   Posttraumatic stress disorder 12/09/2016   Leg edema, right 10/03/2014   Genetic testing 07/10/2014   Breast cancer of lower-inner quadrant of right female breast (Krystal Kelley) 05/29/2014    Past Surgical History:  Procedure Laterality Date   BREAST BIOPSY  05/16/14   BREAST LUMPECTOMY Right 06/18/2014   bunion surgrery     CARPAL TUNNEL RELEASE Left 09/21/2016   Procedure: LEFT CARPAL TUNNEL RELEASE;  Surgeon: Daryll Brod, MD;  Location: [place];  Service: Orthopedics;  Laterality: Left;  REG/FAB   carpel tunnel     CHOLECYSTECTOMY  2015   COLONOSCOPY WITH PROPOFOL N/A 05/27/2020   Procedure: COLONOSCOPY WITH PROPOFOL;  Surgeon: Lucilla Lame, MD;  Location: [facility] ENDOSCOPY;  Service: Endoscopy;  Laterality: N/A;  PATIENT COVID POSITIVE 04/18/2021   HYSTERECTOMY ABDOMINAL WITH SALPINGO-OOPHORECTOMY  05/22/2018   Procedure: HYSTERECTOMY ABDOMINAL WITH BILATERAL  SALPINGO-OOPHORECTOMY;  Surgeon: [name], MD;  Location: [place];  Service: Gynecology;;   PLANTAR FASCIECTOMY     PORTACATH PLACEMENT Left 06/18/2014   Procedure: INSERTION PORT-A-CATH;  Surgeon: Excell Seltzer, MD;  Location: [place];  Service: General;  Laterality: Left;   ROBOTIC ASSISTED LAPAROSCOPIC LYSIS OF ADHESION  05/22/2018   Procedure: XI ROBOTIC ASSISTED LAPAROSCOPIC LYSIS OF ADHESION;  Surgeon: [name], MD;  Location: [place];  Service: Gynecology;;   ROBOTIC ASSISTED TOTAL HYSTERECTOMY WITH BILATERAL SALPINGO OOPHERECTOMY Bilateral 05/22/2018   Procedure: ATTEMPTED XI ROBOTIC ASSISTED TOTAL HYSTERECTOMY WITH BILATERAL SALPINGO OOPHORECTOMY;  Surgeon: [name], MD;  Location: [place];  Service: Gynecology;  Laterality: Bilateral;    OB History     Gravida  0   Para  0   Term  0   Preterm  0   AB  0   Living  0      SAB  0   IAB  0   Ectopic  0   Multiple  0   Live Births  0            Home Medications    Prior to Admission medications   Medication Sig Start Date End Date Taking? Authorizing Provider  azelastine (ASTELIN) 0.1 % nasal spray Place 1 spray into both nostrils 2 (two) times daily. 01/11/22   [provider]  cycloSPORINE (RESTASIS) 0.05 % ophthalmic emulsion Place 1 drop into both eyes 2 (two) times daily.    [provider]  Eluxadoline (VIBERZI) 100 MG TABS Take 1 tablet (100 mg total) by mouth 2 (two) times daily. Patient not taking: Reported on 03/24/2022 01/18/17   Lucilla Lame, MD  EUCRISA 2 % OINT  01/04/19   [provider]  fluticasone (FLONASE) 50 MCG/ACT nasal spray Place 1 spray into both nostrils daily as needed for allergies.    [provider]  furosemide (LASIX) 20 MG tablet Take 1 tablet (20 mg total) by mouth daily for 3 days. 12/25/21 03/24/22  Paulette Blanch, MD  hydrochlorothiazide  (HYDRODIURIL) 25 MG tablet Take 25 mg by mouth every morning. Patient not taking: Reported on 03/24/2022 12/05/20   [provider]  ketoconazole (NIZORAL) 2 % cream Apply 1 application topically daily as needed for irritation (irritated/itchy skin.). 05/28/20   Ralene Bathe, MD  meloxicam (MOBIC) 15 MG tablet Take 15 mg by mouth daily.    [provider]  mometasone (ELOCON) 0.1 % cream Apply 1 application topically daily as needed (Rash). 05/28/20   Ralene Bathe, MD  Multiple Vitamin (MULTIVITAMIN WITH MINERALS) TABS tablet Take 1 tablet by mouth daily. One-A-Day Multivitamin     [provider]  potassium chloride (MICRO-K) 10 MEQ CR capsule Take 20 mEq by mouth daily. 12/29/21   [provider]  valACYclovir (VALTREX) 500 MG tablet Take 500 mg by mouth 2 (two) times daily as needed (outbreaks.).     [provider]  vitamin C (ASCORBIC ACID) 500 MG tablet Take 500 mg by mouth daily.     [provider]  Vitamin D, Ergocalciferol, (DRISDOL) 1.25 MG (50000 UNIT) CAPS capsule Take 1 capsule (50,000 Units total) by mouth every 7 (seven) days for 12 doses. 04/08/22 06/25/22  [name], MD  Zinc Sulfate (ZINC 15 PO) Take by mouth.    [provider]    Family History Family History  Problem Relation Age of Onset   Kidney cancer Father        kidney   Prostate cancer Maternal Uncle        deceased 47   Lung cancer Maternal Grandfather        deceased 24; smoker   Colon cancer Paternal Grandfather        deceased 41s   Diabetes Other    Stroke Other    Hyperlipidemia Other    Hypertension Other    COPD Other    Cancer Other        pat half-sister; ? uterine vs. ovarian ca    Social History Social History   Tobacco Use   Smoking status: Never   Smokeless tobacco: Never  Vaping Use   Vaping Use: Never used  Substance Use Topics   Alcohol use: Yes    Comment: wine- occ    Drug use: No     Allergies    Patient has no active allergies.   Review of Systems Review of Systems   Physical Exam Triage Vital Signs ED Triage Vitals  Enc Vitals Group     BP      Pulse      Resp      Temp      Temp src      SpO2      Weight      Height      Head Circumference      Peak  Flow      Pain Score      Pain Loc      Pain Edu?      Excl. in [place]?    No data found.  Updated Vital Signs LMP 03/21/2018   Visual Acuity Right Eye Distance:   Left Eye Distance:   Bilateral Distance:    Right Eye Near:   Left Eye Near:    Bilateral Near:     Physical Exam HENT:     Nose:     Right Sinus: Maxillary sinus tenderness present.     Left Sinus: Maxillary sinus tenderness present.  Skin:    General: Skin is warm and dry.  Neurological:     General: No focal deficit present.     Mental Status: She is oriented to person, place, and time.  Psychiatric:        Mood and Affect: Mood normal.        Behavior: Behavior normal.      UC Treatments / Results  Labs (all labs ordered are listed, but only abnormal results are displayed) Labs Reviewed - No data to display  EKG   Radiology No results found.  Procedures Procedures (including critical care time)  Medications Ordered in UC Medications - No data to display  Initial Impression / Assessment and Plan / UC Course  I have reviewed the triage vital signs and the nursing notes.  Pertinent labs & imaging results that were available during my care of the patient were reviewed by me and considered in my medical decision making (see chart for details).   Patient is afebrile here without recent antipyretics. Satting well on room air. Overall is well appearing, well hydrated, without respiratory distress.  She has right-sided maxillary tenderness.   Her symptoms are consistent with viral process causing sinusitis.  Discussed bacterial stewardship with the patient and that an antibiotic would not be effective to treat her viral  infection.  I informed her that a secondary bacterial infection would not be expected until at least 10 days after the start of her symptoms.  Recommended treatment today with nasal decongestants.  Suggested if her symptoms (sinus) were severe could treat with a corticosteroid which would help to resolve the inflammation in her sinuses.  She requests medication for cough and will provide benzonatate as well as Tussionex.  She endorses previous use of codeine.  Final Clinical Impressions(s) / UC Diagnoses   Final diagnoses:  None   Discharge Instructions   None    ED Prescriptions   None    PDMP not reviewed this encounter.   Rose Phi, [place] 04/14/22 1941

## 2022-04-14 NOTE — Discharge Instructions (Addendum)
You have been diagnosed with a viral upper respiratory infection based on your symptoms and exam. Viral illnesses cannot be treated with antibiotics - they are self limiting - and you should find your symptoms resolving within a few days. Get plenty of rest and non-caffeinated fluids. Watch for signs of dehydration including reduced urine output and dark colored urine.  We recommend you use over-the-counter medications for symptom control including acetaminophen (Tylenol), ibuprofen (Advil/Motrin) or naproxen (Aleve) for fever, chills or body aches. You may combine use of acetaminophen and ibuprofen/naproxen if needed. Also recommend cold/cough medication.  I have prescribed benzonatate and Tussionex for cough.  Saline mist spray is helpful for removing excess mucus from your nose.  Room humidifiers are helpful to ease breathing at night. I recommend guaifenesin (Mucinex) to help thin and loosen mucus secretions in your respiratory passages.   If appropriate based upon your other medical problems, you might also find relief of nasal/sinus congestion symptoms by using a nasal decongestant such as Flonase (fluticasone) or Sudafed sinus (pseudoephedrine).  You will need to obtain Sudafed from behind the pharmacist counter.  Speak to the pharmacist to verify that you are not duplicating medications with other over-the-counter formulations that you may be using.   Follow up here or with your primary care provider if your symptoms are worsening or not improving.

## 2022-04-14 NOTE — ED Triage Notes (Signed)
Pt. Presents to UC w/ c/o a cough, nasal drainage, nasal congestion and post nasal drip that started 3 days ago.

## 2022-04-19 HISTORY — PX: FOOT SURGERY: SHX648

## 2022-04-21 ENCOUNTER — Ambulatory Visit (INDEPENDENT_AMBULATORY_CARE_PROVIDER_SITE_OTHER): Payer: Medicare Other | Admitting: Podiatry

## 2022-04-21 DIAGNOSIS — M722 Plantar fascial fibromatosis: Secondary | ICD-10-CM

## 2022-04-21 MED ORDER — TRIAMCINOLONE ACETONIDE 40 MG/ML IJ SUSP
40.0000 mg | Freq: Once | INTRAMUSCULAR | Status: AC
  Administered 2022-04-21: 40 mg

## 2022-04-21 NOTE — Progress Notes (Signed)
Presents today chief complaint of painful heels bilateral states that the left one is really killing her.  Objective: Vital signs are stable alert oriented x 3.  There is no erythema edema salines drainage or odor she has pain on palpation MucoClear tubercle left heel.  Assessment: Plan fasciitis left chronic in nature.  She also has in the right foot as well.  Assessment: I injected the bilateral heels today 10 mg Kenalog 5 mg Marcaine point maximal tenderness.  Tolerated procedure well follow-up with her in 6 weeks

## 2022-04-26 NOTE — Telephone Encounter (Signed)
I sent her a My Chart message letting her know visit recommended and asking if I can have appt desk contact her to arrange.

## 2022-04-26 NOTE — Telephone Encounter (Signed)
Krystal Bruins, MD  Netta Corrigan, CMA22 hours ago (12:54 PM)   She needs an appointment for weight and BP before I prescribe Phentermine.     I called patient back but her voice mail box is full and I could not leave a message.

## 2022-04-27 NOTE — Telephone Encounter (Signed)
Patient has visit scheduled 04/29/22.

## 2022-04-29 ENCOUNTER — Encounter: Payer: Self-pay | Admitting: Obstetrics & Gynecology

## 2022-04-29 ENCOUNTER — Ambulatory Visit (INDEPENDENT_AMBULATORY_CARE_PROVIDER_SITE_OTHER): Payer: 59 | Admitting: Obstetrics & Gynecology

## 2022-04-29 MED ORDER — PHENTERMINE HCL 37.5 MG PO CAPS: 37.5000 mg | ORAL_CAPSULE | ORAL | 2 refills | Status: DC

## 2022-04-29 NOTE — Progress Notes (Signed)
    Krystal Kelley 1970-11-25 [id number]        52 y.o.  G0P0000   RP: Morbid obesity management  HPI:  S/P TAH/BSO in 05/2018.  H/O Rt Breast Cancer.  No longer on Anastrozole, followed by Dr Lindi Adie. Urine normal.  BMs normal. BMI increased to 51.09 at Annual Gyn exam 03/24/22.  Referred to [facility], but not covered, so didn't go. Trying to decrease calories.  Tried intermittent fasting in the past.  C/O joint pains limiting her capacity to walk.  Fasting Glucose 97 on 03/24/22.  Started on Tumeric and Probiotics.  OB History  Gravida Para Term Preterm AB Living  0 0 0 0 0 0  SAB IAB Ectopic Multiple Live Births  0 0 0 0 0    Past medical history,surgical history, problem list, medications, allergies, family history and social history were all reviewed and documented in the EPIC chart.   Directed ROS with pertinent positives and negatives documented in the history of present illness/assessment and plan.  Exam:  Vitals:   04/29/22 1104  BP: 110/74  Pulse: 99  SpO2: 99%  Weight: 290 lb (131.5 kg)   General appearance:  Normal  Gynecologic exam: Deferred.  Gyn exam 03/24/22.   Assessment/Plan:  52 y.o. G0P0000   1. Morbid obesity ([name]) S/P TAH/BSO in 05/2018.  H/O Rt Breast Cancer.  No longer on Anastrozole, followed by Dr Lindi Adie. Urine normal.  BMs normal. BMI increased to 51.09 at Annual Gyn exam 03/24/22.  Referred to [facility], but not covered, so didn't go. Trying to decrease calories.  Tried intermittent fasting in the past.  C/O joint pains limiting her capacity to walk.  Fasting Glucose 97 on 03/24/22.  Started on Tumeric and Probiotics.  BP normal.  Low calorie diet and intermittent fasting reviewed.  Will start on Phentermine.  Walks at least 1 mile daily/upper body weights.  Recommend [place] as well.  Other orders - TURMERIC CURCUMIN PO; Take by mouth. - phentermine 37.5 MG capsule; Take 1 capsule (37.5 mg total) by mouth every morning.    Krystal Bruins MD, 11:16 AM 04/29/2022

## 2022-06-01 NOTE — Progress Notes (Signed)
Patient Care Team: Albina Billet, MD as PCP - General (Internal Medicine) Albina Billet, MD (Internal Medicine) Nicholas Lose, MD as Consulting Physician (Hematology and Oncology) Excell Seltzer, MD (Inactive) as Consulting Physician (General Surgery) Gery Pray, MD as Consulting Physician (Radiation Oncology) Delice Bison Charlestine Massed, NP as Nurse Practitioner (Hematology and Oncology)  DIAGNOSIS: No diagnosis found.  SUMMARY OF ONCOLOGIC HISTORY: Oncology History  Breast cancer of lower-inner quadrant of right female breast ([place])  05/14/2014 Mammogram   Right mammogram: 1.5 cm round mass in right breast at 3:00   05/14/2014 Breast US   Right breast: irregular, hypoechoic mass with indistinct margins at 3:00 mearying 1.1 x 0.8 x 1.3 cm.   05/16/2014 Initial Biopsy   Ultrasound-guided biopsy: Invasive ductal carcinoma with DCIS, grade 1-2, ER/PR positive, Ki-67 43%, HER-2 positive   05/28/2014 Breast MRI   1.4 x 1.2 x 1.0 cm biopsy-proven invasive ductal carcinoma and ductal carcinoma in situ in the lower inner quadrant of the right breast.    05/28/2014 Clinical Stage   Stage IA: T1c N0   06/13/2014 Procedure   Breast/Next: Variant of Unknown Significance in the ATM gene called p.4477C>T, p.[id number]. Otherwise, no clinically significant mutation at ATM, BARD1, BRCA1, BRCA2, BRIP1, CDH1, CHEK2, MRE11A, MUTYH, NBN, NF1, PALB2, PTEN, RAD50, RAD51C, RAD51D, and TP53   06/18/2014 Surgery   Right breast lumpectomy: Invasive ductal carcinoma 1.3 cm negative for LVI; DCIS 1 mm from margins, 1 sentinel node negative, grade 3 ER 95%, PR 77%, HER-2 amplified ratio 8.25, Ki-67 43%   06/18/2014 Pathologic Stage   Stage IA: T1c N0 M0   07/18/2014 - 10/03/2014 Chemotherapy   Abraxane Herceptin weekly 12 followed by Herceptin maintenance every 3 weeks to complete one year completed 07/03/2015   10/30/2014 - 12/17/2014 Radiation Therapy   Adjuvant radiation therapy: Right breast 50.4 gray in 28  fractions, lumpectomy cavity boost 12 gray in 6 fractions   02/01/2015 -  Anti-estrogen oral therapy   Tamoxifen 20 mg daily, switched to anastrozole on 09/12/18 after hysterectomy      CHIEF COMPLIANT:  Follow-up of right breast cancer on anastrozole    INTERVAL HISTORY: Krystal Kelley is a 52 y.o. with above-mentioned history of right breast cancer treated with lumpectomy, adjuvant chemotherapy with Herceptin, who is currently on anastrozole. She presents to the clinic for a follow-up.    ALLERGIES:  has No Known Allergies.  MEDICATIONS:  Current Outpatient Medications  Medication Sig Dispense Refill   azelastine (ASTELIN) 0.1 % nasal spray Place 1 spray into both nostrils 2 (two) times daily.     cycloSPORINE (RESTASIS) 0.05 % ophthalmic emulsion Place 1 drop into both eyes 2 (two) times daily.     EUCRISA 2 % OINT      fluticasone (FLONASE) 50 MCG/ACT nasal spray Place 1 spray into both nostrils daily as needed for allergies.     furosemide (LASIX) 20 MG tablet Take 1 tablet (20 mg total) by mouth daily for 3 days. 3 tablet 0   ketoconazole (NIZORAL) 2 % cream Apply 1 application topically daily as needed for irritation (irritated/itchy skin.). 60 g 6   mometasone (ELOCON) 0.1 % cream Apply 1 application topically daily as needed (Rash). 45 g 6   Multiple Vitamin (MULTIVITAMIN WITH MINERALS) TABS tablet Take 1 tablet by mouth daily. One-A-Day Multivitamin      phentermine 37.5 MG capsule Take 1 capsule (37.5 mg total) by mouth every morning. 30 capsule 2   potassium chloride (MICRO-K) 10  MEQ CR capsule Take 20 mEq by mouth daily.     TURMERIC CURCUMIN PO Take by mouth.     valACYclovir (VALTREX) 500 MG tablet Take 500 mg by mouth 2 (two) times daily as needed (outbreaks.).      vitamin C (ASCORBIC ACID) 500 MG tablet Take 500 mg by mouth daily.      Vitamin D, Ergocalciferol, (DRISDOL) 1.25 MG (50000 UNIT) CAPS capsule Take 1 capsule (50,000 Units total) by mouth every 7 (seven)  days for 12 doses. (Patient not taking: Reported on 04/29/2022) 12 capsule 0   Zinc Sulfate (ZINC 15 PO) Take by mouth.     No current facility-administered medications for this visit.    PHYSICAL EXAMINATION: ECOG PERFORMANCE STATUS: {CHL ONC ECOG [phone]}  There were no vitals filed for this visit. There were no vitals filed for this visit.  BREAST:*** No palpable masses or nodules in either right or left breasts. No palpable axillary supraclavicular or infraclavicular adenopathy no breast tenderness or nipple discharge. (exam performed in the presence of a chaperone)  LABORATORY DATA:  I have reviewed the data as listed    Latest Ref Rng & Units 03/24/2022   12:52 PM 12/25/2021    1:59 AM 02/29/2020   12:07 PM  CMP  Glucose 65 - 99 mg/dL 97  102  115   BUN 7 - 25 mg/dL 11  15  14   $ Creatinine 0.50 - 1.03 mg/dL 0.86  1.04  0.84   Sodium 135 - 146 mmol/L 142  139  139   Potassium 3.5 - 5.3 mmol/L 4.5  3.2  4.2   Chloride 98 - 110 mmol/L 103  103  103   CO2 20 - 32 mmol/L 25  29  26   $ Calcium 8.6 - 10.4 mg/dL 9.4  9.0  10.0   Total Protein 6.1 - 8.1 g/dL 7.3  7.5  7.4   Total Bilirubin 0.2 - 1.2 mg/dL 0.3  0.7  0.4   Alkaline Phos 38 - 126 U/L  60    AST 10 - 35 U/L 27  27  20   $ ALT 6 - 29 U/L 34  26  30     Lab Results  Component Value Date   WBC 7.5 03/24/2022   HGB 13.7 03/24/2022   HCT 40.1 03/24/2022   MCV 93.5 03/24/2022   PLT 353 03/24/2022   NEUTROABS 4,716 03/20/2020    ASSESSMENT & PLAN:  No problem-specific Assessment & Plan notes found for this encounter.    No orders of the defined types were placed in this encounter.  The patient has a good understanding of the overall plan. she agrees with it. she will call with any problems that may develop before the next visit here. Total time spent: 30 mins including face to face time and time spent for planning, charting and co-ordination of care   Suzzette Righter, [place] 06/01/22    I Gardiner Coins am acting as a Education administrator for [employer]  ***

## 2022-06-01 NOTE — Assessment & Plan Note (Signed)
Right breast lumpectomy 06/18/2014: Invasive ductal carcinoma 1.3 cm negative for LVI; DCIS 1 mm from margins, 1 sentinel node negative, grade 3 ER 95%, PR 77%, HER-2 amplified ratio 8.25, Ki-67 43% T1 cN0 M0 stage IA   Treatment summary S/P Adjuvant chemo with Abraxane Herceptin started 07/18/2014 completed 10/03/14, Herceptin maintenance completed 07/03/2015 Status post adjuvant radiation completed 12/17/2014 Started tamoxifen 12/2014 ----------------------------------------------------------------------------  Current treatment: Tamoxifen 20 mg daily 12/2014 switched to anastrozole 09/12/2018 since she had hysterectomy To be done Oct 2023   Anastrozole toxicities:  Occ hot flashes Joint aches She will complete antiestrogen therapy by October 2023.   Breast cancer surveillance: 1.  Breast exam 06/02/2022: Benign  2. mammogram 10/16/2021 at [facility]: Benign with breast density category A 12/30/2017: CT abdomen pelvis for left-sided abdominal pain: Fatty liver, uterine fibroid   Obesity: lost 35lbs. (on Slim fast) She will join exercise program and continue to lose weight.   Return to clinic in 1 year for follow-up with long-term survivorship clinic.

## 2022-06-02 ENCOUNTER — Inpatient Hospital Stay: Payer: 59 | Attending: Hematology and Oncology | Admitting: Hematology and Oncology

## 2022-06-02 VITALS — BP 150/102 | HR 93 | Temp 97.7°F | Resp 18 | Ht 64.52 in | Wt 288.2 lb

## 2022-06-02 DIAGNOSIS — C50311 Malignant neoplasm of lower-inner quadrant of right female breast: Secondary | ICD-10-CM | POA: Diagnosis present

## 2022-06-02 DIAGNOSIS — Z79811 Long term (current) use of aromatase inhibitors: Secondary | ICD-10-CM | POA: Insufficient documentation

## 2022-06-02 DIAGNOSIS — Z170 Estrogen receptor positive status [ER+]: Secondary | ICD-10-CM | POA: Insufficient documentation

## 2022-06-09 ENCOUNTER — Ambulatory Visit (INDEPENDENT_AMBULATORY_CARE_PROVIDER_SITE_OTHER): Payer: 59 | Admitting: Podiatry

## 2022-06-09 ENCOUNTER — Encounter: Payer: Self-pay | Admitting: Podiatry

## 2022-06-09 DIAGNOSIS — M7661 Achilles tendinitis, right leg: Secondary | ICD-10-CM | POA: Diagnosis not present

## 2022-06-09 MED ORDER — DEXAMETHASONE SODIUM PHOSPHATE 120 MG/30ML IJ SOLN
2.0000 mg | Freq: Once | INTRAMUSCULAR | Status: AC
  Administered 2022-06-09: 2 mg via INTRA_ARTICULAR

## 2022-06-09 NOTE — Progress Notes (Signed)
She presents today complaining of painful Achilles pain.  States that she also had to take care of the toenail to the third digit of her right foot which is a little bit tender still.  She states that last time I saw her she thought I was talking about wearing her night boot when I was actually talking about wearing her cam boot.  Objective: Vital signs are stable she is alert and oriented x 3 pulses are palpable.  She has pain on direct palpation of her posterior medial aspect of the Achilles on the right foot with mild edema some superficial fluctuance in the posterior heel which is most consistent with a bursitis or bursa.  Assessment: Retrocalcaneal bursitis Achilles tendinitis.  Plan: Injected the area today with dexamethasone and local anesthetic and put her in a cam boot at her home and encouraged her to soak her third digit on the right foot which currently does not demonstrate any signs of infection.

## 2022-06-26 ENCOUNTER — Other Ambulatory Visit (HOSPITAL_COMMUNITY): Payer: Self-pay

## 2022-07-06 ENCOUNTER — Other Ambulatory Visit: Payer: Self-pay | Admitting: Obstetrics & Gynecology

## 2022-07-06 DIAGNOSIS — E559 Vitamin D deficiency, unspecified: Secondary | ICD-10-CM

## 2022-07-12 ENCOUNTER — Other Ambulatory Visit: Payer: 59

## 2022-07-13 ENCOUNTER — Other Ambulatory Visit: Payer: 59

## 2022-07-13 DIAGNOSIS — E559 Vitamin D deficiency, unspecified: Secondary | ICD-10-CM

## 2022-07-13 LAB — VITAMIN D 25 HYDROXY (VIT D DEFICIENCY, FRACTURES): Vit D, 25-Hydroxy: 47 ng/mL (ref 30–100)

## 2022-07-28 ENCOUNTER — Ambulatory Visit: Payer: 59 | Admitting: Podiatry

## 2022-08-17 ENCOUNTER — Ambulatory Visit: Payer: 59 | Admitting: Dermatology

## 2022-08-17 ENCOUNTER — Ambulatory Visit (INDEPENDENT_AMBULATORY_CARE_PROVIDER_SITE_OTHER): Payer: 59 | Admitting: Dermatology

## 2022-08-17 VITALS — BP 162/96 | HR 113

## 2022-08-17 DIAGNOSIS — B360 Pityriasis versicolor: Secondary | ICD-10-CM | POA: Diagnosis not present

## 2022-08-17 DIAGNOSIS — I10 Essential (primary) hypertension: Secondary | ICD-10-CM

## 2022-08-17 DIAGNOSIS — Z79899 Other long term (current) drug therapy: Secondary | ICD-10-CM | POA: Diagnosis not present

## 2022-08-17 DIAGNOSIS — R21 Rash and other nonspecific skin eruption: Secondary | ICD-10-CM

## 2022-08-17 MED ORDER — KETOCONAZOLE 2 % EX SHAM: MEDICATED_SHAMPOO | CUTANEOUS | 5 refills | Status: DC

## 2022-08-17 MED ORDER — FLUOCINOLONE ACETONIDE BODY 0.01 % EX OIL: TOPICAL_OIL | CUTANEOUS | 5 refills | Status: AC

## 2022-08-17 MED ORDER — KETOCONAZOLE 2 % EX CREA: TOPICAL_CREAM | CUTANEOUS | 5 refills | Status: DC

## 2022-08-17 MED ORDER — MOMETASONE FUROATE 0.1 % EX CREA: 1.0000 | TOPICAL_CREAM | Freq: Every day | CUTANEOUS | 6 refills | Status: DC | PRN

## 2022-08-17 NOTE — Progress Notes (Signed)
   Follow Up Visit   Subjective  Krystal Kelley is a 52 y.o. female who presents for the following: Rash On the arms - unsure when it first started, itchy raised hair bumps, she tried exfoliating, but it made the bumps worse. Rash didn't start itchy, but over the past month have become itchy. Her back, neck, and lower legs have also started itching. She did try HC cream, and her nephews topical steroid cream that started with a "T", but continues to flare. Pt started Topamax about a month ago which is when the itching started.   The following portions of the chart were reviewed this encounter and updated as appropriate: medications, allergies, medical history  Review of Systems:  No other skin or systemic complaints except as noted in HPI or Assessment and Plan.  Objective  Well appearing patient in no apparent distress; mood and affect are within normal limits.  A focused examination was performed of the following areas: The face and arms  Relevant exam findings are noted in the Assessment and Plan.    Assessment & Plan   Tinea Versicolor, possibly spreading, with patches of inflammation, not related to Topamax.   Chronic and persistent condition with duration or expected duration over one year. Condition is bothersome/symptomatic for patient. Currently flared.  Exam: Ecentuated follicular openings, vague mildly pink patches scattered on the arms   Treatment Plan:  Apply Ketoconazole cream twice a day as needed until clear. Advised it can take many months for affected areas to repigment.  Use ketoconazole shampoo as body wash 2-3 days per week x 2 months.   For inflammation start Derma-Smoothe FS oil M,W,F all over the body after bath/shower.   Tinea versicolor is a chronic recurrent skin rash causing discolored scaly spots most commonly seen on back, chest, and/or shoulders.  It is generally asymptomatic. The rash is due to overgrowth of a common type of yeast present on  everyone's skin and it is not contagious.  It tends to flare more in the summer due to increased sweating on trunk.  After rash is treated, the scaliness will resolve, but the discoloration will take longer to return to normal pigmentation. The periodic use of an OTC medicated soap/shampoo with zinc or selenium sulfide can be helpful to prevent yeast overgrowth and recurrence.  Patient to contact the office in one month to report condition.   HYPERTENSION Discussed with patient to monitor and follow-up with PCP  Return in about 3 months (around 11/16/2022) for rash follow up .  Maylene Roes, CMA, am acting as scribe for Armida Sans, MD .   Documentation: I have reviewed the above documentation for accuracy and completeness, and I agree with the above.  Armida Sans, MD

## 2022-08-17 NOTE — Patient Instructions (Signed)
Due to recent changes in healthcare laws, you may see results of your pathology and/or laboratory studies on MyChart before the doctors have had a chance to review them. We understand that in some cases there may be results that are confusing or concerning to you. Please understand that not all results are received at the same time and often the doctors may need to interpret multiple results in order to provide you with the best plan of care or course of treatment. Therefore, we ask that you please give us 2 business days to thoroughly review all your results before contacting the office for clarification. Should we see a critical lab result, you will be contacted sooner.   If You Need Anything After Your Visit  If you have any questions or concerns for your doctor, please call our main line at [phone] and press option 4 to reach your doctor's medical assistant. If no one answers, please leave a voicemail as directed and we will return your call as soon as possible. Messages left after 4 pm will be answered the following business day.   You may also send us a message via MyChart. We typically respond to MyChart messages within 1-2 business days.  For prescription refills, please ask your pharmacy to contact our office. Our fax number is [phone].  If you have an urgent issue when the clinic is closed that cannot wait until the next business day, you can page your doctor at the number below.    Please note that while we do our best to be available for urgent issues outside of office hours, we are not available 24/7.   If you have an urgent issue and are unable to reach us, you may choose to seek medical care at your doctor's office, retail clinic, urgent care center, or emergency room.  If you have a medical emergency, please immediately call 911 or go to the emergency department.  Pager Numbers  - Dr. Kowalski: [phone]  - Dr. Moye: [phone]  - Dr. Stewart:  [phone]  In the event of inclement weather, please call our main line at [phone] for an update on the status of any delays or closures.  Dermatology Medication Tips: Please keep the boxes that topical medications come in in order to help keep track of the instructions about where and how to use these. Pharmacies typically print the medication instructions only on the boxes and not directly on the medication tubes.   If your medication is too expensive, please contact our office at [phone] option 4 or send us a message through MyChart.   We are unable to tell what your co-pay for medications will be in advance as this is different depending on your insurance coverage. However, we may be able to find a substitute medication at lower cost or fill out paperwork to get insurance to cover a needed medication.   If a prior authorization is required to get your medication covered by your insurance company, please allow us 1-2 business days to complete this process.  Drug prices often vary depending on where the prescription is filled and some pharmacies may offer cheaper prices.  The website www.goodrx.com contains coupons for medications through different pharmacies. The prices here do not account for what the cost may be with help from insurance (it may be cheaper with your insurance), but the website can give you the price if you did not use any insurance.  - You can print the associated coupon and take it with   your prescription to the pharmacy.  - You may also stop by our office during regular business hours and pick up a GoodRx coupon card.  - If you need your prescription sent electronically to a different pharmacy, notify our office through [facility] MyChart or by phone at [phone] option 4.     Si Usted Necesita Algo Despus de Su Visita  Tambin puede enviarnos un mensaje a travs de MyChart. Por lo general respondemos a los mensajes de MyChart en el transcurso de 1 a 2  das hbiles.  Para renovar recetas, por favor pida a su farmacia que se ponga en contacto con nuestra oficina. Nuestro nmero de fax es el [phone].  Si tiene un asunto urgente cuando la clnica est cerrada y que no puede esperar hasta el siguiente da hbil, puede llamar/localizar a su doctor(a) al nmero que aparece a continuacin.   Por favor, tenga en cuenta que aunque hacemos todo lo posible para estar disponibles para asuntos urgentes fuera del horario de oficina, no estamos disponibles las 24 horas del da, los 7 das de la semana.   Si tiene un problema urgente y no puede comunicarse con nosotros, puede optar por buscar atencin mdica  en el consultorio de su doctor(a), en una clnica privada, en un centro de atencin urgente o en una sala de emergencias.  Si tiene una emergencia mdica, por favor llame inmediatamente al 911 o vaya a la sala de emergencias.  Nmeros de bper  - Dr. Kowalski: [phone]  - Dra. Moye: [phone]  - Dra. Stewart: [phone]  En caso de inclemencias del tiempo, por favor llame a nuestra lnea principal al [phone] para una actualizacin sobre el estado de cualquier retraso o cierre.  Consejos para la medicacin en dermatologa: Por favor, guarde las cajas en las que vienen los medicamentos de uso tpico para ayudarle a seguir las instrucciones sobre dnde y cmo usarlos. Las farmacias generalmente imprimen las instrucciones del medicamento slo en las cajas y no directamente en los tubos del medicamento.   Si su medicamento es muy caro, por favor, pngase en contacto con nuestra oficina llamando al [phone] y presione la opcin 4 o envenos un mensaje a travs de MyChart.   No podemos decirle cul ser su copago por los medicamentos por adelantado ya que esto es diferente dependiendo de la cobertura de su seguro. Sin embargo, es posible que podamos encontrar un medicamento sustituto a menor costo o llenar un formulario para que el  seguro cubra el medicamento que se considera necesario.   Si se requiere una autorizacin previa para que su compaa de seguros cubra su medicamento, por favor permtanos de 1 a 2 das hbiles para completar este proceso.  Los precios de los medicamentos varan con frecuencia dependiendo del lugar de dnde se surte la receta y alguna farmacias pueden ofrecer precios ms baratos.  El sitio web www.goodrx.com tiene cupones para medicamentos de diferentes farmacias. Los precios aqu no tienen en cuenta lo que podra costar con la ayuda del seguro (puede ser ms barato con su seguro), pero el sitio web puede darle el precio si no utiliz ningn seguro.  - Puede imprimir el cupn correspondiente y llevarlo con su receta a la farmacia.  - Tambin puede pasar por nuestra oficina durante el horario de atencin regular y recoger una tarjeta de cupones de GoodRx.  - Si necesita que su receta se enve electrnicamente a una farmacia diferente, informe a nuestra oficina a travs de MyChart de [facility]   o por telfono llamando al [phone] y presione la opcin 4.  

## 2022-08-22 ENCOUNTER — Encounter: Payer: Self-pay | Admitting: Dermatology

## 2022-08-23 ENCOUNTER — Ambulatory Visit (INDEPENDENT_AMBULATORY_CARE_PROVIDER_SITE_OTHER): Payer: 59 | Admitting: Podiatry

## 2022-08-23 ENCOUNTER — Encounter: Payer: Self-pay | Admitting: Podiatry

## 2022-08-23 DIAGNOSIS — M7661 Achilles tendinitis, right leg: Secondary | ICD-10-CM

## 2022-08-23 DIAGNOSIS — M722 Plantar fascial fibromatosis: Secondary | ICD-10-CM

## 2022-08-23 DIAGNOSIS — R600 Localized edema: Secondary | ICD-10-CM

## 2022-08-23 MED ORDER — DEXAMETHASONE SODIUM PHOSPHATE 120 MG/30ML IJ SOLN
2.0000 mg | Freq: Once | INTRAMUSCULAR | Status: AC
Start: 2022-08-23 — End: 2022-08-23
  Administered 2022-08-23: 2 mg via INTRA_ARTICULAR

## 2022-08-23 MED ORDER — TRIAMCINOLONE ACETONIDE 40 MG/ML IJ SUSP
20.0000 mg | Freq: Once | INTRAMUSCULAR | Status: AC
Start: 2022-08-23 — End: 2022-08-23
  Administered 2022-08-23: 20 mg

## 2022-08-23 NOTE — Progress Notes (Signed)
She presents today for a follow-up of her Achilles tendons she states that she is doing much better the lateral aspect of the right posterior heel is doing much better the medial aspect is somewhat tender.  States that the muscle spasms she was having her right calf has subsided after + home physical therapy that she was performing.  She also states that her left heel is still painful.  Objective: Vital signs are stable alert and oriented x 3 she still has fluctuance and bursitis on the posterior medial aspect of her right heel near her Achilles tendon insertion site.  She also has Planter fasciitis of her left heel with pain on palpation of this area.  Assessment: Retrocalcaneal bursitis right Planter fasciitis left.  Plan: Injected the left heel today 20 mg Kenalog 5 mg Marcaine.  Injected the bursa just beneath the skin making sure not to inject into the tendon itself.  This was performed after sterile Betadine skin prep.  A total of 2 mg of dexamethasone was injected into the bursa.  I will follow-up with her in 6 weeks

## 2022-08-30 DIAGNOSIS — M65331 Trigger finger, right middle finger: Secondary | ICD-10-CM | POA: Diagnosis not present

## 2022-09-20 ENCOUNTER — Ambulatory Visit: Payer: 59 | Admitting: Podiatry

## 2022-09-22 ENCOUNTER — Ambulatory Visit (INDEPENDENT_AMBULATORY_CARE_PROVIDER_SITE_OTHER): Payer: 59 | Admitting: Podiatry

## 2022-09-22 DIAGNOSIS — M7661 Achilles tendinitis, right leg: Secondary | ICD-10-CM

## 2022-09-22 MED ORDER — DEXAMETHASONE SODIUM PHOSPHATE 120 MG/30ML IJ SOLN
2.0000 mg | Freq: Once | INTRAMUSCULAR | Status: AC
Start: 2022-09-22 — End: 2022-09-22
  Administered 2022-09-22: 2 mg via INTRA_ARTICULAR

## 2022-09-22 NOTE — Progress Notes (Signed)
She presents today for follow-up of her heel pain right posterior aspect.  She states that the bump seems to be getting bigger and is warm to the touch.  Objective: Vital signs stable oriented x 3.  She has a palpable bursa on the posterior aspect of the right heel at the plantar fascia beginning insertion site.  Assessment: Bursitis Achilles tendinitis right heel posterior.  Plan: Injected 2 mg of dexamethasone and local anesthetic to the area today.  Tolerated procedure well making sure not to inject into the tendon itself.

## 2022-10-07 ENCOUNTER — Ambulatory Visit: Payer: 59 | Admitting: Dietician

## 2022-10-08 DIAGNOSIS — Z1231 Encounter for screening mammogram for malignant neoplasm of breast: Secondary | ICD-10-CM | POA: Diagnosis not present

## 2022-10-11 ENCOUNTER — Encounter: Payer: Self-pay | Admitting: Obstetrics & Gynecology

## 2022-10-19 ENCOUNTER — Encounter: Payer: 59 | Attending: Obstetrics & Gynecology | Admitting: Dietician

## 2022-10-19 ENCOUNTER — Encounter: Payer: Self-pay | Admitting: Dietician

## 2022-10-19 VITALS — Ht 65.0 in | Wt 276.4 lb

## 2022-10-19 DIAGNOSIS — Z6841 Body Mass Index (BMI) 40.0 and over, adult: Secondary | ICD-10-CM | POA: Insufficient documentation

## 2022-10-19 DIAGNOSIS — Z713 Dietary counseling and surveillance: Secondary | ICD-10-CM | POA: Diagnosis not present

## 2022-10-19 DIAGNOSIS — Z853 Personal history of malignant neoplasm of breast: Secondary | ICD-10-CM | POA: Insufficient documentation

## 2022-10-19 DIAGNOSIS — E669 Obesity, unspecified: Secondary | ICD-10-CM

## 2022-10-19 NOTE — Patient Instructions (Addendum)
Make it a very important goal to look into getting back into water aerobics.  Try to work your way up to drinking 64 oz of water each day.  Work on trying to eat something about every 3 hours throughout the day.  Consider taking a melatonin at the same time every evening to help with sleep regularity.

## 2022-10-19 NOTE — Progress Notes (Signed)
Medical Nutrition Therapy  Appointment Start time:  31  Appointment End time:  1215 Pt 30 minutes late, Abridged Appointment   Primary concerns today: Weight loss  Referral diagnosis: E66.01 - Morbid obesity Preferred learning style: No preference indicated Learning readiness: Not ready   NUTRITION ASSESSMENT   Anthropometrics  Ht: 5'5" Wt: 276.4 BMI:46.00 kg/m2  Body Composition Scale Date  10/19/2022  Current Body Weight 276.4 lbs  Total Body Fat % 49.9  Muscle-Mass lbs 78.0  BMI 46.00     Clinical Medical Hx: Breast cancer, Obesity Medications: HCTZ, Furosemide, KCl Labs: N/A Notable Signs/Symptoms: N/A   Lifestyle & Dietary Hx Pt reports interest in losing weight, states they have lost 20 lbs since the beginning of the year. Pt reports long history of weight gain, initially started when started on birth control. Pt reports hx of breast cancer, states they have gained additional weight since treatment for cancer. Pt reports history of weight loss from playing softball, but was unable to continue playing after diagnosis of breast cancer. Pt reports achilles tendonitis, currently getting treatment. Pt reports history of trying IF to lose weight, states it was unsuccessful. Pt reports eating 1-2 meals usually during the day.  Pt reports staying up all night most days.   Estimated daily fluid intake: 48 oz Supplements: MVI Sleep: Up all night, sleeps poorly, may go days without sleep Stress / self-care:  Current average weekly physical activity: Very little   24-Hr Dietary Recall First Meal (1:00 PM): [place] and mayonnaise sandwich Snack:  Second Meal (11:00 PM): Double cheeseburger, small fries, sweet tea Snack:  Third Meal:  Snack:  Beverages: water, sweet tea   NUTRITION DIAGNOSIS  NB-1.1 Food and nutrition-related knowledge deficit As related to obesity.  As evidenced by BMI of 46.00 kg/m2, irregular dietary pattern.   NUTRITION INTERVENTION   Nutrition education (E-1) on the following topics:  Educated pt on the role of sleep in metabolism and weight control. Educated pt ways to prioritize meal regularity to help with positive behavior change.  Handouts Provided Include  Body Composition Results  Learning Style & Readiness for Change Teaching method utilized: Visual & Auditory  Demonstrated degree of understanding via: Teach Back  Barriers to learning/adherence to lifestyle change: Denial  Goals Established by Pt Make it a very important goal to look into getting back into water aerobics. Try to work your way up to drinking 64 oz of water each day. Work on trying to eat something about every 3 hours throughout the day. Consider taking a melatonin at the same time every evening to help with sleep regularity.   MONITORING & EVALUATION Dietary intake, weekly physical activity PRN  Next Steps  Patient is to work on goals established.

## 2022-10-20 ENCOUNTER — Ambulatory Visit (INDEPENDENT_AMBULATORY_CARE_PROVIDER_SITE_OTHER): Payer: 59 | Admitting: Podiatry

## 2022-10-20 DIAGNOSIS — M7661 Achilles tendinitis, right leg: Secondary | ICD-10-CM | POA: Diagnosis not present

## 2022-10-20 MED ORDER — DEXAMETHASONE SODIUM PHOSPHATE 120 MG/30ML IJ SOLN
2.0000 mg | Freq: Once | INTRAMUSCULAR | Status: DC
Start: 2022-10-20 — End: 2023-06-02

## 2022-10-20 NOTE — Progress Notes (Signed)
She presents today for follow-up of her Achilles tendinitis right heel.  She states that is doing much better still little tender right here she points to the very posterior aspect of the heel.  Objective: Tenderness on palpation posterior aspect of the calcaneus at the Achilles insertion site.  Feels fluctuance and most likely consistent with bursitis.  Assessment: Insertional Achilles tendinitis most likely retrocalcaneal retro-Achilles bursitis.  Plan: Injected the area today with 2 mg of dexamethasone local anesthetic is swelled up like a bursa.  I will follow-up with her in 6 weeks if necessary.

## 2022-10-27 ENCOUNTER — Ambulatory Visit: Payer: 59 | Admitting: Internal Medicine

## 2022-11-11 ENCOUNTER — Ambulatory Visit: Payer: Medicaid Other | Admitting: Dermatology

## 2022-11-17 ENCOUNTER — Ambulatory Visit (INDEPENDENT_AMBULATORY_CARE_PROVIDER_SITE_OTHER): Payer: 59 | Admitting: Dermatology

## 2022-11-17 ENCOUNTER — Encounter: Payer: Self-pay | Admitting: Dermatology

## 2022-11-17 VITALS — BP 156/98

## 2022-11-17 DIAGNOSIS — Z7189 Other specified counseling: Secondary | ICD-10-CM

## 2022-11-17 DIAGNOSIS — B360 Pityriasis versicolor: Secondary | ICD-10-CM | POA: Diagnosis not present

## 2022-11-17 DIAGNOSIS — Z79899 Other long term (current) drug therapy: Secondary | ICD-10-CM | POA: Diagnosis not present

## 2022-11-17 DIAGNOSIS — L821 Other seborrheic keratosis: Secondary | ICD-10-CM

## 2022-11-17 MED ORDER — MOMETASONE FUROATE 0.1 % EX CREA
1.0000 | TOPICAL_CREAM | Freq: Every day | CUTANEOUS | 1 refills | Status: DC | PRN
Start: 2022-11-17 — End: 2023-10-26

## 2022-11-17 MED ORDER — KETOCONAZOLE 2 % EX SHAM: 1.0000 | MEDICATED_SHAMPOO | CUTANEOUS | 11 refills | Status: DC

## 2022-11-17 MED ORDER — KETOCONAZOLE 2 % EX CREA
1.0000 | TOPICAL_CREAM | Freq: Every day | CUTANEOUS | 11 refills | Status: DC | PRN
Start: 2022-11-17 — End: 2023-06-30

## 2022-11-17 NOTE — Progress Notes (Signed)
   Follow-Up Visit   Subjective  Krystal Kelley is a 52 y.o. female who presents for the following: Tinea versicolor follow up treating with Ketoconazole cream every day, Ketoconazole shampoo weekly and Mometsone  cream prn itch The patient has spots, moles and lesions to be evaluated, some may be new or changing and the patient may have concern these could be cancer.  The following portions of the chart were reviewed this encounter and updated as appropriate: medications, allergies, medical history  Review of Systems:  No other skin or systemic complaints except as noted in HPI or Assessment and Plan.  Objective  Well appearing patient in no apparent distress; mood and affect are within normal limits. A focused examination was performed of the following areas: arms, legs, back Relevant exam findings are noted in the Assessment and Plan.   Assessment & Plan   Tinea Versicolor - Improved Chronic and persistent condition with duration or expected duration over one year. Condition is improving with treatment but not currently at goal.  Continue Ketoconazole cream twice a day as needed until clear. Advised it can take many months for affected areas to repigment.  May continue Mometasone cream every day-bid prn itch.  Continue ketoconazole shampoo as body wash once a week, especially in summer months   Long term medication management.  Patient is using long term (months to years) prescription medication  to control their dermatologic condition.  These medications require periodic monitoring to evaluate for efficacy and side effects and may require periodic laboratory monitoring.  Tinea versicolor is a chronic recurrent skin rash causing discolored scaly spots most commonly seen on back, chest, and/or shoulders.  It is generally asymptomatic. The rash is due to overgrowth of a common type of yeast present on everyone's skin and it is not contagious.  It tends to flare more in the summer due  to increased sweating on trunk.  After rash is treated, the scaliness will resolve, but the discoloration will take longer to return to normal pigmentation. The periodic use of an OTC medicated soap/shampoo with zinc or selenium sulfide can be helpful to prevent yeast overgrowth and recurrence.   SEBORRHEIC KERATOSIS - Stuck-on, waxy, tan-brown papules and/or plaques  - Benign-appearing - Discussed benign etiology and prognosis. - Observe - Call for any changes  Counseling and coordination of care  Tinea versicolor  Related Medications ketoconazole (NIZORAL) 2 % cream Apply 1 Application topically daily as needed for irritation (irritated/itchy skin.).  mometasone (ELOCON) 0.1 % cream Apply 1 Application topically daily as needed (Rash).  Medication management  Seborrheic keratosis   Return in about 1 year (around 11/17/2023).  I, Joanie Coddington, CMA, am acting as scribe for Armida Sans, MD .  Documentation: I have reviewed the above documentation for accuracy and completeness, and I agree with the above.  Armida Sans, MD

## 2022-11-17 NOTE — Patient Instructions (Signed)
Due to recent changes in healthcare laws, you may see results of your pathology and/or laboratory studies on MyChart before the doctors have had a chance to review them. We understand that in some cases there may be results that are confusing or concerning to you. Please understand that not all results are received at the same time and often the doctors may need to interpret multiple results in order to provide you with the best plan of care or course of treatment. Therefore, we ask that you please give [place] 2 business days to thoroughly review all your results before contacting the office for clarification. Should we see a critical lab result, you will be contacted sooner.   If You Need Anything After Your Visit  If you have any questions or concerns for your doctor, please call our main line at [phone] and press option 4 to reach your doctor's medical assistant. If no one answers, please leave a voicemail as directed and we will return your call as soon as possible. Messages left after 4 pm will be answered the following business day.   You may also send [place] a message via MyChart. We typically respond to MyChart messages within 1-2 business days.  For prescription refills, please ask your pharmacy to contact our office. Our fax number is [phone].  If you have an urgent issue when the clinic is closed that cannot wait until the next business day, you can page your doctor at the number below.    Please note that while we do our best to be available for urgent issues outside of office hours, we are not available 24/7.   If you have an urgent issue and are unable to reach [place], you may choose to seek medical care at your doctor's office, retail clinic, urgent care center, or emergency room.  If you have a medical emergency, please immediately call 911 or go to the emergency department.  Pager Numbers  - Dr. Gwen Pounds: [phone]  - Dr. Roseanne Reno: [phone]  In the event of inclement  weather, please call our main line at [phone] for an update on the status of any delays or closures.  Dermatology Medication Tips: Please keep the boxes that topical medications come in in order to help keep track of the instructions about where and how to use these. Pharmacies typically print the medication instructions only on the boxes and not directly on the medication tubes.   If your medication is too expensive, please contact our office at [phone] option 4 or send [place] a message through MyChart.   We are unable to tell what your co-pay for medications will be in advance as this is different depending on your insurance coverage. However, we may be able to find a substitute medication at lower cost or fill out paperwork to get insurance to cover a needed medication.   If a prior authorization is required to get your medication covered by your insurance company, please allow [place] 1-2 business days to complete this process.  Drug prices often vary depending on where the prescription is filled and some pharmacies may offer cheaper prices.  The website www.goodrx.com contains coupons for medications through different pharmacies. The prices here do not account for what the cost may be with help from insurance (it may be cheaper with your insurance), but the website can give you the price if you did not use any insurance.  - You can print the associated coupon and take it with your prescription to the pharmacy.  -  You may also stop by our office during regular business hours and pick up a GoodRx coupon card.  - If you need your prescription sent electronically to a different pharmacy, notify our office through [facility] or by phone at [phone] option 4.     Si Usted Necesita Algo Despus de Su Visita  Tambin puede enviarnos un mensaje a travs de Clinical cytogeneticist. Por lo general respondemos a los mensajes de MyChart en el transcurso de 1 a 2 das hbiles.  Para renovar recetas,  por favor pida a su farmacia que se ponga en contacto con nuestra oficina. Annie Sable de fax es [place] [phone].  Si tiene un asunto urgente cuando la clnica est cerrada y que no puede esperar hasta el siguiente da hbil, puede llamar/localizar a su doctor(a) al nmero que aparece a continuacin.   Por favor, tenga en cuenta que aunque hacemos todo lo posible para estar disponibles para asuntos urgentes fuera del horario de [place], no estamos disponibles las 24 horas del da, los 7 [address]  [address] de la [place].   Si tiene un problema urgente y no puede comunicarse con nosotros, puede optar por buscar atencin mdica  en el consultorio de su doctor(a), en una clnica privada, en un centro de atencin urgente o en una sala de emergencias.  Si tiene Engineer, drilling, por favor llame inmediatamente al 911 o vaya a la sala de emergencias.  Nmeros de bper  - Dr. Gwen Pounds: [phone]  - Dra. Moye: [phone]  - Dra. Roseanne Reno: [phone]  En caso de inclemencias del [place], por favor llame a Lacy Duverney principal al [phone] para una actualizacin sobre el [place] de cualquier retraso o cierre.  Consejos para la medicacin en dermatologa: Por favor, guarde las cajas en las que vienen los medicamentos de uso tpico para ayudarle a seguir las instrucciones sobre dnde y cmo usarlos. Las farmacias generalmente imprimen las instrucciones del medicamento slo en las cajas y no directamente en los tubos del [place].   Si su medicamento es muy caro, por favor, pngase en contacto con Rolm Gala llamando al [phone] y presione la opcin 4 o envenos un mensaje a travs de Clinical cytogeneticist.   No podemos decirle cul ser su copago por los medicamentos por adelantado ya que esto es diferente dependiendo de la cobertura de su seguro. Sin embargo, es posible que podamos encontrar un medicamento sustituto a Audiological scientist un formulario para que el seguro cubra el medicamento que se  considera necesario.   Si se requiere una autorizacin previa para que su compaa de seguros [place] su medicamento, por favor permtanos de 1 a 2 das hbiles para completar [address]  Los precios de los medicamentos varan con frecuencia dependiendo del Environmental consultant de dnde se surte la receta y alguna farmacias pueden ofrecer precios ms baratos.  El sitio web www.goodrx.com tiene cupones para medicamentos de Health and safety inspector. Los precios aqu no tienen en cuenta lo que podra costar con la ayuda del seguro (puede ser ms barato con su seguro), pero el sitio web puede darle el precio si no utiliz Tourist information centre manager.  - Puede imprimir el cupn correspondiente y llevarlo con su receta a la farmacia.  - Tambin puede pasar por nuestra oficina durante el horario de atencin regular y Education officer, museum una tarjeta de cupones de GoodRx.  - Si necesita que su receta se enve electrnicamente a Psychiatrist, informe a nuestra oficina a travs de MyChart de [place] o por telfono llamando al [phone] y  presione la opcin 4.

## 2022-12-01 ENCOUNTER — Encounter: Payer: Self-pay | Admitting: Podiatry

## 2022-12-01 ENCOUNTER — Other Ambulatory Visit: Payer: Self-pay | Admitting: Podiatry

## 2022-12-01 ENCOUNTER — Ambulatory Visit (INDEPENDENT_AMBULATORY_CARE_PROVIDER_SITE_OTHER): Payer: 59 | Admitting: Podiatry

## 2022-12-01 DIAGNOSIS — M7661 Achilles tendinitis, right leg: Secondary | ICD-10-CM | POA: Diagnosis not present

## 2022-12-01 DIAGNOSIS — S86011A Strain of right Achilles tendon, initial encounter: Secondary | ICD-10-CM

## 2022-12-01 DIAGNOSIS — R600 Localized edema: Secondary | ICD-10-CM | POA: Diagnosis not present

## 2022-12-01 MED ORDER — TRAMADOL HCL 50 MG PO TABS: 50.0000 mg | ORAL_TABLET | Freq: Three times a day (TID) | ORAL | 0 refills | Status: AC | PRN

## 2022-12-01 NOTE — Progress Notes (Signed)
She presents today states the left foot is doing good but this posterior heel on the right is killing me she said this tendinitis is hurting so bad it came and leg left foot in the bed.  I can walk you can exercise and gaining weight I am not able to physically get around and maintain my general good health.  She states that everything we have done works for short time and then it is right back.  Objective: Vital signs are stable alert and oriented x 3.  Pulses are palpable.  There is no erythema edema cellulitis drainage or odor though she does have swelling posterior aspect of the right calcaneus at the insertion of the Achilles.  There is fluctuance beneath the skin consistent with a bursitis which the area is warm to the touch.  She has pain with sharp dorsiflexion of the foot at the inferior aspect of the Achilles at the inferior calcaneus.  Assessment: Probable Achilles tendon tear secondary to chronic [place] fasciitis.  Plan: Discussed etiology pathology conservative surgical therapies all conservative therapies have failed such as steroids nonsteroidals cam boot night boot contrast baths injection therapy and at home physical therapy.  So we are currently requesting an MRI of the right rear foot ankle.  This is for surgical consideration as well as differential diagnoses.

## 2022-12-28 ENCOUNTER — Telehealth: Payer: Self-pay | Admitting: Podiatry

## 2022-12-28 NOTE — Telephone Encounter (Signed)
Scheduled pt the 18th at 830 per [facility]. Called pt and let her know

## 2022-12-28 NOTE — Telephone Encounter (Signed)
Patient called stating she is having so much pain in her right foot. Pt states she has a MRI done last Thursday. She states she can get a shot now and she wants one. The 16th Dr Al Corpus is off and that Wednesday he is booked. Pt wants him to fit her in on that Wednesday. She would like Morrie Sheldon to call her and let her know what time she can come in.

## 2022-12-31 ENCOUNTER — Ambulatory Visit
Admission: RE | Admit: 2022-12-31 | Discharge: 2022-12-31 | Disposition: A | Discharge status: Home / Self Care | Payer: 59 | Source: Ambulatory Visit | Attending: Podiatry | Admitting: Podiatry

## 2022-12-31 DIAGNOSIS — S86011A Strain of right Achilles tendon, initial encounter: Secondary | ICD-10-CM

## 2023-01-05 ENCOUNTER — Ambulatory Visit (INDEPENDENT_AMBULATORY_CARE_PROVIDER_SITE_OTHER): Payer: 59 | Admitting: Podiatry

## 2023-01-05 DIAGNOSIS — M7661 Achilles tendinitis, right leg: Secondary | ICD-10-CM

## 2023-01-05 DIAGNOSIS — M722 Plantar fascial fibromatosis: Secondary | ICD-10-CM

## 2023-01-05 MED ORDER — DEXAMETHASONE SODIUM PHOSPHATE 120 MG/30ML IJ SOLN
2.0000 mg | Freq: Once | INTRAMUSCULAR | Status: AC
Start: 2023-01-05 — End: 2023-01-05
  Administered 2023-01-05: 2 mg via INTRA_ARTICULAR

## 2023-01-05 MED ORDER — TRIAMCINOLONE ACETONIDE 40 MG/ML IJ SUSP
40.0000 mg | Freq: Once | INTRAMUSCULAR | Status: AC
Start: 2023-01-05 — End: 2023-01-05
  Administered 2023-01-05: 40 mg

## 2023-01-05 NOTE — Progress Notes (Signed)
She presents today states that she has been dealing with this painful plantar fasciitis for quite some time now and due to the MRI we were unable to give her injections.  She is also having severe pain on palpation of the Achilles tendon its insertion site on the right heel.  Objective: Vital signs stable alert and x 3.  There is no erythema edema salines drainage or odor she has severe pain on palpation of the Achilles tendons insertion site of the right heel and pain on palpation medial calcaneal tubercles bilateral.  Assessment: Plantar fasciitis bilateral heel.  Insertional Achilles tendinitis suspect probable tear of the Achilles at its insertion.  MRI results will be back probably next week.  Plan: Discussed etiology pathology and surgical therapies at this point started her on her oral anti-inflammatories also recommended that she take injections today 2 mg of dexamethasone posterior right heel and plantar aspect of the bilateral heels 10 mg each and we will follow-up with her once the MRIs are complete and return to [place].

## 2023-01-06 ENCOUNTER — Ambulatory Visit (INDEPENDENT_AMBULATORY_CARE_PROVIDER_SITE_OTHER): Payer: 59 | Admitting: Dermatology

## 2023-01-06 ENCOUNTER — Encounter: Payer: Self-pay | Admitting: Dermatology

## 2023-01-06 VITALS — BP 148/88 | HR 84

## 2023-01-06 DIAGNOSIS — B360 Pityriasis versicolor: Secondary | ICD-10-CM

## 2023-01-06 DIAGNOSIS — L905 Scar conditions and fibrosis of skin: Secondary | ICD-10-CM

## 2023-01-06 DIAGNOSIS — L299 Pruritus, unspecified: Secondary | ICD-10-CM

## 2023-01-06 DIAGNOSIS — Z79899 Other long term (current) drug therapy: Secondary | ICD-10-CM

## 2023-01-06 DIAGNOSIS — D239 Other benign neoplasm of skin, unspecified: Secondary | ICD-10-CM

## 2023-01-06 DIAGNOSIS — Z1283 Encounter for screening for malignant neoplasm of skin: Secondary | ICD-10-CM

## 2023-01-06 DIAGNOSIS — W908XXA Exposure to other nonionizing radiation, initial encounter: Secondary | ICD-10-CM

## 2023-01-06 DIAGNOSIS — D2371 Other benign neoplasm of skin of right lower limb, including hip: Secondary | ICD-10-CM

## 2023-01-06 DIAGNOSIS — Z7189 Other specified counseling: Secondary | ICD-10-CM

## 2023-01-06 DIAGNOSIS — Z853 Personal history of malignant neoplasm of breast: Secondary | ICD-10-CM

## 2023-01-06 DIAGNOSIS — L814 Other melanin hyperpigmentation: Secondary | ICD-10-CM

## 2023-01-06 DIAGNOSIS — D229 Melanocytic nevi, unspecified: Secondary | ICD-10-CM

## 2023-01-06 DIAGNOSIS — L821 Other seborrheic keratosis: Secondary | ICD-10-CM

## 2023-01-06 DIAGNOSIS — Z872 Personal history of diseases of the skin and subcutaneous tissue: Secondary | ICD-10-CM

## 2023-01-06 DIAGNOSIS — L578 Other skin changes due to chronic exposure to nonionizing radiation: Secondary | ICD-10-CM

## 2023-01-06 MED ORDER — FLUCONAZOLE 200 MG PO TABS
ORAL_TABLET | ORAL | 0 refills | Status: DC
Start: 2023-01-06 — End: 2023-05-05

## 2023-01-06 NOTE — Patient Instructions (Addendum)
Tinea versicolor is a chronic recurrent skin rash causing discolored scaly spots most commonly seen on back, chest, and/or shoulders.  It is generally asymptomatic. The rash is due to overgrowth of a common type of yeast present on everyone's skin and it is not contagious.  It tends to flare more in the summer due to increased sweating on trunk.  After rash is treated, the scaliness will resolve, but the discoloration will take longer to return to normal pigmentation. The periodic use of an OTC medicated soap/shampoo with zinc or selenium sulfide can be helpful to prevent yeast overgrowth and recurrence.  Start diflucan 200 mg tab - take 1 tab by mouth weekly for 4 weeks.   Side effects of fluconazole (diflucan) include nausea, diarrhea, headache, dizziness, taste changes, rare risk of irritation of the liver, allergy, or decreased blood counts (which could show up as infection or tiredness).   Continue Ketoconazole cream twice a day as needed until clear.   May continue Mometasone cream every day-twice as needed itch.   Continue ketoconazole shampoo as body wash once a week, especially in summer months  For inflammation Continue Derma-Smoothe FS oil M,W,F all over the body after bath/shower.   Topical steroids (such as triamcinolone, fluocinolone, fluocinonide, mometasone, clobetasol, halobetasol, betamethasone, hydrocortisone) can cause thinning and lightening of the skin if they are used for too long in the same area. Your physician has selected the right strength medicine for your problem and area affected on the body. Please use your medication only as directed by your physician to prevent side effects.    Due to recent changes in healthcare laws, you may see results of your pathology and/or laboratory studies on MyChart before the doctors have had a chance to review them. We understand that in some cases there may be results that are confusing or concerning to you. Please understand that not  all results are received at the same time and often the doctors may need to interpret multiple results in order to provide you with the best plan of care or course of treatment. Therefore, we ask that you please give [place] 2 business days to thoroughly review all your results before contacting the office for clarification. Should we see a critical lab result, you will be contacted sooner.   If You Need Anything After Your Visit  If you have any questions or concerns for your doctor, please call our main line at [phone] and press option 4 to reach your doctor's medical assistant. If no one answers, please leave a voicemail as directed and we will return your call as soon as possible. Messages left after 4 pm will be answered the following business day.   You may also send [place] a message via MyChart. We typically respond to MyChart messages within 1-2 business days.  For prescription refills, please ask your pharmacy to contact our office. Our fax number is [phone].  If you have an urgent issue when the clinic is closed that cannot wait until the next business day, you can page your doctor at the number below.    Please note that while we do our best to be available for urgent issues outside of office hours, we are not available 24/7.   If you have an urgent issue and are unable to reach [place], you may choose to seek medical care at your doctor's office, retail clinic, urgent care center, or emergency room.  If you have a medical emergency, please immediately call 911 or go to  the emergency department.  Pager Numbers  - Dr. Gwen Pounds: [phone]  - Dr. Roseanne Reno: [phone]  - Dr. Katrinka Blazing: [phone]   In the event of inclement weather, please call our main line at [phone] for an update on the status of any delays or closures.  Dermatology Medication Tips: Please keep the boxes that topical medications come in in order to help keep track of the instructions about where and how to  use these. Pharmacies typically print the medication instructions only on the boxes and not directly on the medication tubes.   If your medication is too expensive, please contact our office at [phone] option 4 or send [place] a message through MyChart.   We are unable to tell what your co-pay for medications will be in advance as this is different depending on your insurance coverage. However, we may be able to find a substitute medication at lower cost or fill out paperwork to get insurance to cover a needed medication.   If a prior authorization is required to get your medication covered by your insurance company, please allow [place] 1-2 business days to complete this process.  Drug prices often vary depending on where the prescription is filled and some pharmacies may offer cheaper prices.  The website www.goodrx.com contains coupons for medications through different pharmacies. The prices here do not account for what the cost may be with help from insurance (it may be cheaper with your insurance), but the website can give you the price if you did not use any insurance.  - You can print the associated coupon and take it with your prescription to the pharmacy.  - You may also stop by our office during regular business hours and pick up a GoodRx coupon card.  - If you need your prescription sent electronically to a different pharmacy, notify our office through [facility] or by phone at [phone] option 4.     Si Usted Necesita Algo Despus de Su Visita  Tambin puede enviarnos un mensaje a travs de Clinical cytogeneticist. Por lo general respondemos a los mensajes de MyChart en el transcurso de 1 a 2 das hbiles.  Para renovar recetas, por favor pida a su farmacia que se ponga en contacto con nuestra oficina. Annie Sable de fax es [place] [phone].  Si tiene un asunto urgente cuando la clnica est cerrada y que no puede esperar hasta el siguiente da hbil, puede llamar/localizar a su  doctor(a) al nmero que aparece a continuacin.   Por favor, tenga en cuenta que aunque hacemos todo lo posible para estar disponibles para asuntos urgentes fuera del horario de [place], no estamos disponibles las 24 horas del da, los 7 [address]  [address] de la [place].   Si tiene un problema urgente y no puede comunicarse con nosotros, puede optar por buscar atencin mdica  en el consultorio de su doctor(a), en una clnica privada, en un centro de atencin urgente o en una sala de emergencias.  Si tiene Engineer, drilling, por favor llame inmediatamente al 911 o vaya a la sala de emergencias.  Nmeros de bper  - Dr. Gwen Pounds: [phone]  - Dra. Roseanne Reno: [phone]  - Dr. Katrinka Blazing: [phone]   En caso de inclemencias del tiempo, por favor llame a Lacy Duverney principal al [phone] para una actualizacin sobre el [place] de cualquier retraso o cierre.  Consejos para la medicacin en dermatologa: Por favor, guarde las cajas en las que vienen los medicamentos de uso tpico para ayudarle a seguir las instrucciones sobre dnde y  cmo usarlos. Las farmacias generalmente imprimen las instrucciones del medicamento slo en las cajas y no directamente en los tubos del [place].   Si su medicamento es muy caro, por favor, pngase en contacto con Rolm Gala llamando al [phone] y presione la opcin 4 o envenos un mensaje a travs de Clinical cytogeneticist.   No podemos decirle cul ser su copago por los medicamentos por adelantado ya que esto es diferente dependiendo de la cobertura de su seguro. Sin embargo, es posible que podamos encontrar un medicamento sustituto a Audiological scientist un formulario para que el seguro cubra el medicamento que se considera necesario.   Si se requiere una autorizacin previa para que su compaa de seguros [place] su medicamento, por favor permtanos de 1 a 2 das hbiles para completar [address]  Los precios de los medicamentos varan con frecuencia dependiendo del  Environmental consultant de dnde se surte la receta y alguna farmacias pueden ofrecer precios ms baratos.  El sitio web www.goodrx.com tiene cupones para medicamentos de Health and safety inspector. Los precios aqu no tienen en cuenta lo que podra costar con la ayuda del seguro (puede ser ms barato con su seguro), pero el sitio web puede darle el precio si no utiliz Tourist information centre manager.  - Puede imprimir el cupn correspondiente y llevarlo con su receta a la farmacia.  - Tambin puede pasar por nuestra oficina durante el horario de atencin regular y Education officer, museum una tarjeta de cupones de GoodRx.  - Si necesita que su receta se enve electrnicamente a una farmacia diferente, informe a nuestra oficina a travs de MyChart de [place] o por telfono llamando al [phone] y presione la opcin 4.

## 2023-01-06 NOTE — Progress Notes (Signed)
Follow-Up Visit   Subjective  Krystal Kelley is a 52 y.o. female who presents for the following: Skin Cancer Screening and Full Body Skin Exam hx of tinea versicolor, hx of urticaria, hx of sks  The patient presents for Total-Body Skin Exam (TBSE) for skin cancer screening and mole check. The patient has spots, moles and lesions to be evaluated, some may be new or changing and the patient may have concern these could be cancer.    The following portions of the chart were reviewed this encounter and updated as appropriate: medications, allergies, medical history  Review of Systems:  No other skin or systemic complaints except as noted in HPI or Assessment and Plan.  Objective  Well appearing patient in no apparent distress; mood and affect are within normal limits.  A full examination was performed including scalp, head, eyes, ears, nose, lips, neck, chest, axillae, abdomen, back, buttocks, bilateral upper extremities, bilateral lower extremities, hands, feet, fingers, toes, fingernails, and toenails. All findings within normal limits unless otherwise noted below.   Relevant physical exam findings are noted in the Assessment and Plan.    Assessment & Plan   Tinea Versicolor Exam: Accentuated follicular openings, vague mildly pink patches scattered on the arms   Chronic and persistent condition with duration or expected duration over one year. Condition is symptomatic/ bothersome to patient. Not currently at goal.  Start fluconazole 200 mg tab - take 1 tab po once weekly for 4 weeks  Side effects of fluconazole (diflucan) include nausea, diarrhea, headache, dizziness, taste changes, rare risk of irritation of the liver, allergy, or decreased blood counts (which could show up as infection or tiredness).  Continue Ketoconazole cream twice a day as needed until clear. Advised it can take many months for affected areas to repigment.  May continue Mometasone cream every day-bid prn  itch  Continue ketoconazole shampoo as body wash once a week, especially in summer months   For inflammation continue Derma-Smoothe FS oil M,W,F all over the body after bath/shower.    Tinea versicolor is a chronic recurrent skin rash causing discolored scaly spots most commonly seen on back, chest, and/or shoulders.  It is generally asymptomatic. The rash is due to overgrowth of a common type of yeast present on everyone's skin and it is not contagious.  It tends to flare more in the summer due to increased sweating on trunk.  After rash is treated, the scaliness will resolve, but the discoloration will take longer to return to normal pigmentation. The periodic use of an OTC medicated soap/shampoo with zinc or selenium sulfide can be helpful to prevent yeast overgrowth and recurrence.  SKIN CANCER SCREENING PERFORMED TODAY.  ACTINIC DAMAGE - Chronic condition, secondary to cumulative UV/sun exposure - diffuse scaly erythematous macules with underlying dyspigmentation - Recommend daily broad spectrum sunscreen SPF 30+ to sun-exposed areas, reapply every 2 hours as needed.  - Staying in the shade or wearing long sleeves, sun glasses (UVA+UVB protection) and wide brim hats (4-inch brim around the entire circumference of the hat) are also recommended for sun protection.  - Call for new or changing lesions.  MELANOCYTIC NEVI Exam: Tan-brown and/or pink-flesh-colored symmetric macules and papules  Treatment Plan: Benign appearing on exam today. Recommend observation. Call clinic for new or changing moles. Recommend daily use of broad spectrum spf 30+ sunscreen to sun-exposed areas.   SEBORRHEIC KERATOSIS At feet, face, trunk  - Stuck-on, waxy, tan-brown papules and/or plaques  - Benign-appearing - Discussed benign etiology  and prognosis. - Observe - Call for any changes  DERMATOFIBROMA at right lateral distal thigh Exam: Firm pink/brown papulenodule with dimple sign. Treatment Plan: A  dermatofibroma is a benign growth possibly related to trauma, such as an insect bite, cut from shaving, or inflamed acne-type bump.  Treatment options to remove include shave or excision with resulting scar and risk of recurrence.  Since benign-appearing and not bothersome, will observe for now.   HISTORY OF Right Breast Cancer - No lymphadenopathy  - Clear. Observe for recurrence.   BURN SCAR at lower abdomen Exam: Dyspigmented smooth macule or patch. Treatment: Benign-appearing.  Observation.  No treatment recommended   HYSTERECTOMY SCAR at lower abdomen Exam: Dyspigmented smooth macule or patch. Treatment: Benign-appearing.  Observation No treatment recommended   Tinea versicolor  Related Medications ketoconazole (NIZORAL) 2 % cream Apply 1 Application topically daily as needed for irritation (irritated/itchy skin.).  mometasone (ELOCON) 0.1 % cream Apply 1 Application topically daily as needed (Rash).  fluconazole (DIFLUCAN) 200 MG tablet Take 1 tab by mouth weekly for 4 weeks.   Return for keep follow up as scheduled .  IAsher Muir, CMA, am acting as scribe for Armida Sans, MD.  Documentation: I have reviewed the above documentation for accuracy and completeness, and I agree with the above.  Armida Sans, MD

## 2023-01-11 ENCOUNTER — Encounter: Payer: Self-pay | Admitting: Dermatology

## 2023-01-12 ENCOUNTER — Ambulatory Visit: Payer: 59 | Admitting: Podiatry

## 2023-02-02 ENCOUNTER — Ambulatory Visit (INDEPENDENT_AMBULATORY_CARE_PROVIDER_SITE_OTHER): Payer: 59 | Admitting: Podiatry

## 2023-02-02 DIAGNOSIS — M722 Plantar fascial fibromatosis: Secondary | ICD-10-CM

## 2023-02-02 DIAGNOSIS — M7661 Achilles tendinitis, right leg: Secondary | ICD-10-CM

## 2023-02-02 DIAGNOSIS — Z01818 Encounter for other preprocedural examination: Secondary | ICD-10-CM

## 2023-02-02 NOTE — Progress Notes (Addendum)
She presents today for follow-up of her pain to her right foot and ankle.  She states that it seems to be worsening all the time is affecting her ability perform her daily activities and maintain her general good health.  Objective: Vital signs are stable alert oriented x 3 I reviewed her past medical history medications allergies surgery social history I reviewed her review of systems which demonstrates denial of fever chills nausea vomiting muscle aches pains calf pain back pain chest pain shortness of breath.  Only stating pain to the right foot.  She has moderate to severe pain on palpation of the distalmost aspect of the Achilles with a large nonpulsatile nodule along the posterior superior aspect of the calcaneus.  She also has pain on palpation medial calcaneal tubercle of the right heel with pain on palpation of the fifth metatarsal distally right.  Assessment: Pain in limb secondary to insertional Achilles tendinitis with a tear.  Retrocalcaneal heel spur with bone marrow edema.  Gastroc equinus.  I tear of the plantar fascia right.  Painful internal fixation of fifth metatarsal right foot.  Plan: Discussed etiology pathology conservative versus surgical therapies.  At this point we consented her today for corrective surgery consisting of a gastroc recession and Achilles tenolysis retrocalcaneal heel spur resection endoscopic plantar fasciotomy PRP injection excision or removal of painful internal fixation fifth metatarsal status post bunion repair screw and cast application.  We discussed that she would be under general anesthesia should be prone she would also be casted in a nonweightbearing fashion needing to utilize a knee scooter or crutches postoperatively.  We also discussed the possible side effects which may include but not limited to postop pain bleeding swelling infection recurrence need for further surgery overcorrection under correction also digit loss limb loss of life.  I will  follow-up with her in the near future for surgical intervention.  We did not inject corticosteroids today.  Krystal Kelley was unable to find to her primary care provider and that she has had a history of breast cancer so at this point were going to do preop labs consisting of a CBC a CMP and a hemoglobin A1c.  Should this come back abnormalwe will have a PCP assigned to her.

## 2023-02-02 NOTE — Addendum Note (Signed)
Addended by: Kristian Covey on: 02/02/2023 03:42 PM   Modules accepted: Orders

## 2023-02-05 LAB — CBC WITH DIFFERENTIAL/PLATELET
Basophils Absolute: 0 10*3/uL (ref 0.0–0.2)
Basos: 1 %
EOS (ABSOLUTE): 0.1 10*3/uL (ref 0.0–0.4)
Eos: 1 %
Hematocrit: 40.1 % (ref 34.0–46.6)
Hemoglobin: 13.2 g/dL (ref 11.1–15.9)
Immature Grans (Abs): 0 10*3/uL (ref 0.0–0.1)
Immature Granulocytes: 0 %
Lymphocytes Absolute: 3.2 10*3/uL — ABNORMAL HIGH (ref 0.7–3.1)
Lymphs: 36 %
MCH: 31.2 pg (ref 26.6–33.0)
MCHC: 32.9 g/dL (ref 31.5–35.7)
MCV: 95 fL (ref 79–97)
Monocytes Absolute: 0.7 10*3/uL (ref 0.1–0.9)
Monocytes: 8 %
Neutrophils Absolute: 4.8 10*3/uL (ref 1.4–7.0)
Neutrophils: 54 %
Platelets: 385 10*3/uL (ref 150–450)
RBC: 4.23 x10E6/uL (ref 3.77–5.28)
RDW: 12.9 % (ref 11.7–15.4)
WBC: 8.8 10*3/uL (ref 3.4–10.8)

## 2023-02-05 LAB — COMPREHENSIVE METABOLIC PANEL
ALT: 28 [IU]/L (ref 0–32)
AST: 29 [IU]/L (ref 0–40)
Albumin: 4.2 g/dL (ref 3.8–4.9)
Alkaline Phosphatase: 84 [IU]/L (ref 44–121)
BUN/Creatinine Ratio: 16 (ref 9–23)
BUN: 16 mg/dL (ref 6–24)
Bilirubin Total: 0.3 mg/dL (ref 0.0–1.2)
CO2: 27 mmol/L (ref 20–29)
Calcium: 9.5 mg/dL (ref 8.7–10.2)
Chloride: 104 mmol/L (ref 96–106)
Creatinine, Ser: 0.98 mg/dL (ref 0.57–1.00)
Globulin, Total: 3 g/dL (ref 1.5–4.5)
Glucose: 115 mg/dL — ABNORMAL HIGH (ref 70–99)
Potassium: 4.3 mmol/L (ref 3.5–5.2)
Sodium: 144 mmol/L (ref 134–144)
Total Protein: 7.2 g/dL (ref 6.0–8.5)
eGFR: 69 mL/min/{1.73_m2} (ref >59)

## 2023-02-05 LAB — HEMOGLOBIN A1C
Est. average glucose Bld gHb Est-mCnc: 111 mg/dL
Hgb A1c MFr Bld: 5.5 % (ref 4.8–5.6)

## 2023-02-08 ENCOUNTER — Telehealth: Payer: Self-pay | Admitting: Podiatry

## 2023-02-08 NOTE — Telephone Encounter (Signed)
Notification or Prior Authorization is not required for the requested services You are not required to submit a notification/prior authorization based on the information provided. If you have general questions about the prior authorization requirements, visit [email or web address] > Clinician Resources > Advance and Admission Notification Requirements. The number above acknowledges your notification. Please write this reference number down for future reference. If you would like to request an organization determination, please call us at [phone]. Decision ID #: [id number] The number above acknowledges your inquiry and our response. Please write this number down and refer to it for future inquiries. Coverage and payment for an item or service is governed by the member's benefit plan document, and, if applicable, the provider's participation agreement with the Health Plan. Patient details  keyboard_arrow_up Patient name Krystal Kelley Member number [id number] Group number [id number] Product POS Relationship Employee Effective date 08/18/2022 Termination date 04/19/2023 Insurance type Medicare Verbal language preference English Written language preference English info A future timeline may be available for this member. For future coverage please call the telephone number located on the back of the [facility] Medical ID card. Admitting/attending physician details  keyboard_arrow_up Name Ernestene Kiel Tax ID number [id number] Address [address] [place], [place], [place] [address] Status In network Service details  keyboard_arrow_up Place of service [facility] What is place of service? Service details Surgical Facility details  keyboard_arrow_up Name [facility] Tax ID number [id number] Address [address] [place], [place] [address] [id number] Status In network Facility service dates details  keyboard_arrow_up Start date 02/25/2023 End  date 04/19/2023 Service description Scheduled What is service description? Diagnosis code details  keyboard_arrow_up Code pointer Primary Diagnosis code M76.61 Description Achilles tendinitis, right leg Code pointer New Diagnosis code M72.2 Description Plantar fascial fibromatosis Procedure code details  keyboard_arrow_up Code pointer Primary procedure code 66063 Description Removal of implant; deep (eg, buried wire, pin, screw, metal band, nail, rod or plate) Selected servicing provider The provider who is providing the service being requested.  Servicing provider name Phillips Odor Tax ID number [id number] Address [address] [place], [place], [place] [address]  T. [phone] Status In network Code pointer New procedure code [phone] Description Endoscopic plantar fasciotomy Selected servicing provider The provider who is providing the service being requested.  Servicing provider name Phillips Odor Tax ID number [id number] Address [address] [place], [place], [place] [address]  T. [phone] Status In network Code pointer New procedure code [phone] Description Tenolysis, flexor or extensor tendon, leg and/or ankle; single, each tendon Selected servicing provider The provider who is providing the service being requested.  Servicing provider name Phillips Odor Tax ID number [id number] Address [address] [place], [place], [place] [address]  T. [phone] Status In network Code pointer New procedure code [phone] Description Gastrocnemius recession (eg, Strayer procedure) Selected servicing provider The provider who is providing the service being requested.  Servicing provider name Phillips Odor Tax ID number [id number] Address [address] [place], [place], [place] [address]  T. [phone] Status In network Code pointer New procedure code 28118 Description Ostectomy, calcaneus; Selected servicing provider The provider who is providing the service  being requested.  Servicing provider name Phillips Odor Tax ID number [id number] Address [address] [place], [place], [place] [address]  T. [phone] Status In network

## 2023-02-14 ENCOUNTER — Encounter: Payer: Self-pay | Admitting: Podiatry

## 2023-02-16 ENCOUNTER — Telehealth: Payer: Self-pay | Admitting: *Deleted

## 2023-02-16 NOTE — Telephone Encounter (Signed)
-----   Message from Elinor Parkinson sent at 02/16/2023  7:34 AM EDT ----- [name] can let her know that her blood work looks great.

## 2023-02-23 ENCOUNTER — Other Ambulatory Visit: Payer: Self-pay | Admitting: Podiatry

## 2023-02-23 MED ORDER — CEPHALEXIN 500 MG PO CAPS: 500.0000 mg | ORAL_CAPSULE | Freq: Three times a day (TID) | ORAL | 0 refills | Status: DC

## 2023-02-23 MED ORDER — ONDANSETRON HCL 4 MG PO TABS: 4.0000 mg | ORAL_TABLET | Freq: Three times a day (TID) | ORAL | 0 refills | Status: DC | PRN

## 2023-02-23 MED ORDER — OXYCODONE-ACETAMINOPHEN 10-325 MG PO TABS
1.0000 | ORAL_TABLET | Freq: Three times a day (TID) | ORAL | 0 refills | Status: AC | PRN
Start: 2023-02-23 — End: 2023-03-02

## 2023-02-25 DIAGNOSIS — M722 Plantar fascial fibromatosis: Secondary | ICD-10-CM | POA: Diagnosis not present

## 2023-02-25 DIAGNOSIS — M216X1 Other acquired deformities of right foot: Secondary | ICD-10-CM | POA: Diagnosis not present

## 2023-02-25 DIAGNOSIS — Z4889 Encounter for other specified surgical aftercare: Secondary | ICD-10-CM | POA: Diagnosis not present

## 2023-02-25 DIAGNOSIS — M7731 Calcaneal spur, right foot: Secondary | ICD-10-CM | POA: Diagnosis not present

## 2023-02-25 DIAGNOSIS — M7661 Achilles tendinitis, right leg: Secondary | ICD-10-CM | POA: Diagnosis not present

## 2023-03-02 ENCOUNTER — Ambulatory Visit (INDEPENDENT_AMBULATORY_CARE_PROVIDER_SITE_OTHER): Payer: 59 | Admitting: Podiatry

## 2023-03-02 ENCOUNTER — Ambulatory Visit (INDEPENDENT_AMBULATORY_CARE_PROVIDER_SITE_OTHER): Payer: 59

## 2023-03-02 ENCOUNTER — Encounter: Payer: Self-pay | Admitting: Podiatry

## 2023-03-02 VITALS — BP 148/80 | HR 110 | Temp 98.2°F

## 2023-03-02 DIAGNOSIS — T8460XA Infection and inflammatory reaction due to internal fixation device of unspecified site, initial encounter: Secondary | ICD-10-CM

## 2023-03-02 DIAGNOSIS — M722 Plantar fascial fibromatosis: Secondary | ICD-10-CM

## 2023-03-02 DIAGNOSIS — M7731 Calcaneal spur, right foot: Secondary | ICD-10-CM

## 2023-03-02 DIAGNOSIS — M7661 Achilles tendinitis, right leg: Secondary | ICD-10-CM

## 2023-03-02 DIAGNOSIS — Z9889 Other specified postprocedural states: Secondary | ICD-10-CM

## 2023-03-03 NOTE — Progress Notes (Signed)
She presents today for first postop visit date of surgery 02/25/2023.  She had an endoscopic plantar fasciotomy and gastroc recession and Achilles tenolysis and a retrocalcaneal heel spur resection and removal of fifth metatarsal screw and cast application.  She denies fever chills nausea vomit muscle aches and pains does relate constipation.  She relates low back pain for which she typically sees chiropractor.  Objective: Vital signs are stable blood pressure is 148/80 pulse is 110 temperature is 98.2 F cast is intact dry and clean loose proximally snug distally toes have good capillary fill time and have good sensation and good range of motion.  Radiographs taken today demonstrate osseously mature individual retrocalcaneal spurs resected no gas in the tissue no foreign bodies.  Assessment: Well-healing surgical foot and leg.  Plan: Continue the use of nonweightbearing cast right lower extremity utilizing a knee scooter.  I will follow-up with her in 1 to 2 weeks for cast removal inspection of the incisions and reapplication of cast.

## 2023-03-09 ENCOUNTER — Encounter: Payer: Self-pay | Admitting: Podiatry

## 2023-03-09 ENCOUNTER — Ambulatory Visit (INDEPENDENT_AMBULATORY_CARE_PROVIDER_SITE_OTHER): Payer: 59 | Admitting: Podiatry

## 2023-03-09 DIAGNOSIS — M7731 Calcaneal spur, right foot: Secondary | ICD-10-CM

## 2023-03-09 DIAGNOSIS — M7661 Achilles tendinitis, right leg: Secondary | ICD-10-CM | POA: Diagnosis not present

## 2023-03-09 DIAGNOSIS — Z9889 Other specified postprocedural states: Secondary | ICD-10-CM

## 2023-03-09 DIAGNOSIS — T8460XA Infection and inflammatory reaction due to internal fixation device of unspecified site, initial encounter: Secondary | ICD-10-CM

## 2023-03-09 DIAGNOSIS — M722 Plantar fascial fibromatosis: Secondary | ICD-10-CM

## 2023-03-09 NOTE — Progress Notes (Signed)
She presents today for second postop visit date of surgery February 25, 2023.  Endoscopic fasciotomy gastroc recession Achilles tenolysis retrocalcaneal heel spur resection PRP injection fifth metatarsal screw removal and cast application.  She denies fever chills nausea vomiting muscle aches pains calf pain back pain chest pain shortness of breath.  Objective: Presents nonweightbearing with her knee scooter cast intact once removed demonstrates dry sterile dressing intact once removed demonstrates margins are well coapted sutures are intact staples are intact minimal edema no erythema cellulitis drainage or odor.  Assessment: Well-healing surgical foot.  Plan: Redressed the foot today dressed a compressive dressing reapplied a shorter lighter below knee cast continue nonweightbearing status follow-up with her in 1 month

## 2023-03-23 ENCOUNTER — Encounter: Payer: Self-pay | Admitting: Podiatry

## 2023-03-23 ENCOUNTER — Ambulatory Visit (INDEPENDENT_AMBULATORY_CARE_PROVIDER_SITE_OTHER): Payer: 59 | Admitting: Podiatry

## 2023-03-23 DIAGNOSIS — M7731 Calcaneal spur, right foot: Secondary | ICD-10-CM

## 2023-03-23 DIAGNOSIS — Z9889 Other specified postprocedural states: Secondary | ICD-10-CM

## 2023-03-23 DIAGNOSIS — M722 Plantar fascial fibromatosis: Secondary | ICD-10-CM

## 2023-03-23 DIAGNOSIS — M7661 Achilles tendinitis, right leg: Secondary | ICD-10-CM

## 2023-03-23 DIAGNOSIS — T8460XA Infection and inflammatory reaction due to internal fixation device of unspecified site, initial encounter: Secondary | ICD-10-CM

## 2023-03-24 NOTE — Progress Notes (Signed)
She presents today for her third postop visit date of surgery February 25, 2023.  She is status post retrocalcaneal heel spur resection Achilles tenolysis gastroc recession and endoscopic fasciotomy with cast.  She denies fever chills nausea vomit muscle aches and pains denies chest pain shortness of breath.  Objective: Vital signs are stable she is alert oriented x 3.  Cast is intact.  Slightly dirty on the bottom.  Presents on her knee scooter nonweightbearing.  Once cast and dressing were removed margins are intact and well coapted staples and sutures are intact once removed margins remain well coapted of the incision sites.  She has good plantarflexion against resistance.  No pain.  Assessment well-healing surgical foot and leg.  Plan: Placed her in a cam boot she is to wear the cam boot at all times except for at nighttime and bathing.  She is not to walk on this freely rather she is to be partial weightbearing.  She understands this is minimal to we will follow-up with me in 2 weeks.

## 2023-04-06 ENCOUNTER — Encounter: Payer: Self-pay | Admitting: Podiatry

## 2023-04-06 ENCOUNTER — Ambulatory Visit (INDEPENDENT_AMBULATORY_CARE_PROVIDER_SITE_OTHER): Payer: 59 | Admitting: Podiatry

## 2023-04-06 DIAGNOSIS — M7731 Calcaneal spur, right foot: Secondary | ICD-10-CM

## 2023-04-06 DIAGNOSIS — T8460XA Infection and inflammatory reaction due to internal fixation device of unspecified site, initial encounter: Secondary | ICD-10-CM

## 2023-04-06 DIAGNOSIS — M722 Plantar fascial fibromatosis: Secondary | ICD-10-CM

## 2023-04-06 DIAGNOSIS — M7661 Achilles tendinitis, right leg: Secondary | ICD-10-CM

## 2023-04-06 DIAGNOSIS — Z9889 Other specified postprocedural states: Secondary | ICD-10-CM

## 2023-04-06 NOTE — Progress Notes (Signed)
She presents today date of surgery 02/25/2023 endoscopic plantar fasciotomy gastroc recession Achilles tenolysis retrocalcaneal heel spur resection PRP injection and cast application with screw removal of the fifth metatarsal.  She denies fever chills nausea vomit muscle aches pains states that she is ready to get away from the crutches.  Objective: Vital signs are stable alert oriented x 3 presents utilizing crutches nonweightbearing fashion today once boot was removed the foot demonstrates minimal swelling incision sites have gone on to heal uneventfully.  No purulence no malodor's.  She has a good range of motion at the ankle joint and has good full strong pressure plantarflexion against resistance.  Assessment: Well-healing surgical foot.  Plan: I will allow her to start walking with the boot standing in the shower without the boot and sleeping without the boot.  I will follow-up with her in 1 month I feel that in 1 month she will have already switched to shoes no earlier than 2 weeks from now.

## 2023-04-21 ENCOUNTER — Ambulatory Visit: Payer: 59 | Admitting: Obstetrics & Gynecology

## 2023-04-21 ENCOUNTER — Ambulatory Visit: Payer: 59 | Admitting: Obstetrics and Gynecology

## 2023-05-04 ENCOUNTER — Encounter: Payer: 59 | Admitting: Podiatry

## 2023-05-04 ENCOUNTER — Encounter: Payer: Self-pay | Admitting: Podiatry

## 2023-05-04 ENCOUNTER — Ambulatory Visit (INDEPENDENT_AMBULATORY_CARE_PROVIDER_SITE_OTHER): Payer: 59 | Admitting: Podiatry

## 2023-05-04 VITALS — Ht 65.0 in | Wt 276.4 lb

## 2023-05-04 DIAGNOSIS — M722 Plantar fascial fibromatosis: Secondary | ICD-10-CM | POA: Diagnosis not present

## 2023-05-04 DIAGNOSIS — M7661 Achilles tendinitis, right leg: Secondary | ICD-10-CM

## 2023-05-04 DIAGNOSIS — M7731 Calcaneal spur, right foot: Secondary | ICD-10-CM

## 2023-05-04 NOTE — Patient Instructions (Signed)

## 2023-05-05 ENCOUNTER — Encounter: Payer: Self-pay | Admitting: Family Medicine

## 2023-05-05 ENCOUNTER — Ambulatory Visit: Payer: 59 | Admitting: Family Medicine

## 2023-05-05 VITALS — BP 130/79 | HR 96 | Temp 98.1°F | Resp 18 | Ht 65.0 in | Wt 291.2 lb

## 2023-05-05 DIAGNOSIS — Z23 Encounter for immunization: Secondary | ICD-10-CM

## 2023-05-05 DIAGNOSIS — M65312 Trigger thumb, left thumb: Secondary | ICD-10-CM

## 2023-05-05 DIAGNOSIS — I10 Essential (primary) hypertension: Secondary | ICD-10-CM | POA: Diagnosis not present

## 2023-05-05 DIAGNOSIS — R197 Diarrhea, unspecified: Secondary | ICD-10-CM

## 2023-05-05 DIAGNOSIS — K591 Functional diarrhea: Secondary | ICD-10-CM | POA: Insufficient documentation

## 2023-05-05 MED ORDER — VIBERZI 100 MG PO TABS
100.0000 mg | ORAL_TABLET | Freq: Two times a day (BID) | ORAL | 3 refills | Status: DC
Start: 2023-05-05 — End: 2023-06-02

## 2023-05-05 MED ORDER — PHENTERMINE HCL 37.5 MG PO CAPS
ORAL_CAPSULE | ORAL | 0 refills | Status: DC
Start: 2023-05-05 — End: 2023-06-02

## 2023-05-05 NOTE — Assessment & Plan Note (Signed)
Takes hydrochlorothiazide and potassium chloride.  BP well controlled at 130/79

## 2023-05-05 NOTE — Progress Notes (Signed)
Established Patient Office Visit  Subjective   Patient ID: Krystal Kelley, female    DOB: 08-06-1970  Age: 53 y.o. MRN: [id number]  Chief Complaint  Patient presents with   Medical Management of Chronic Issues    HPI  Krystal Kelley is doing pretty well.  She had foot surgery for a ruptured Achilles tendon and had to wear a boot and a cast and she was on a knee board for a while.  She got pver all that now and is doing well.  She has little pain.   She sees her breast cancer doctor yearly and is not taking any hormones. She has gained 20 pounds in the last few weeks and she thinks is because of restricted mobility.  She is going to physical therapy for her right knee and that seems to be helping.   She has got triggering of her right thumb and would like to do something about this as she has already had triggering of her left thumb and had surgery.   She asked for Vibrezi because she has chronic diarrhea following her cholecystectomy. PHQ-9-4, GAD-7-0    ROS    Objective:     BP 130/79 (BP Location: Left Arm, Patient Position: Bed low/side rails up;Sitting, Cuff Size: Large)   Pulse 96   Temp 98.1 F (36.7 C) (Oral)   Resp 18   Ht 5\' 5"  (1.651 m)   Wt 291 lb 3.2 oz (132.1 kg)   LMP 03/21/2018   SpO2 97%   BMI 48.46 kg/m    Physical Exam Vitals and nursing note reviewed.  Constitutional:      Appearance: Normal appearance.  HENT:     Head: Normocephalic and atraumatic.  Eyes:     Conjunctiva/sclera: Conjunctivae normal.  Cardiovascular:     Rate and Rhythm: Normal rate and regular rhythm.  Pulmonary:     Effort: Pulmonary effort is normal.     Breath sounds: Normal breath sounds.  Musculoskeletal:     Right lower leg: No edema.     Left lower leg: No edema.  Skin:    General: Skin is warm and dry.  Neurological:     Mental Status: She is alert and oriented to person, place, and time.  Psychiatric:        Mood and Affect: Mood normal.        Behavior: Behavior  normal.        Thought Content: Thought content normal.        Judgment: Judgment normal.          No results found for any visits on 05/05/23.    The 10-year ASCVD risk score (Arnett DK, et al., 2019) is: 3%    Assessment & Plan:  Need for immunization against influenza -     Flu vaccine trivalent PF, 6mos and older(Flulaval,Afluria,Fluarix,Fluzone)  Morbid obesity (HCC) Assessment & Plan: Topamax did not suppress her appetite but did get her off sugary sodas.  Will give her phentermine 37.5 for 4 months total.   Orders: -     Phentermine HCl; One capsule by mouth qAM  Dispense: 30 capsule; Refill: 0  Primary hypertension Assessment & Plan: Takes hydrochlorothiazide and potassium chloride.  BP well controlled at 130/79   Diarrhea, unspecified type -     Viberzi; Take 1 tablet (100 mg total) by mouth 2 (two) times daily with a meal.  Dispense: 60 tablet; Refill: 3  Trigger finger of left thumb Assessment & Plan: Had surgery  on her left hand thumb trigger finger and is now developed it in her right hand.  Advise she needs to see orthopedics for shots if that is not successful and surgery is appropriate.   Diarrhea, functional Assessment & Plan: Has frequent diarrhea following her cholecystectomy.  Would like viberzi      Return in about 4 weeks (around 06/02/2023) for chronic followup.    Krystal Medina, MD

## 2023-05-05 NOTE — Assessment & Plan Note (Signed)
Had surgery on her left hand thumb trigger finger and is now developed it in her right hand.  Advise she needs to see orthopedics for shots if that is not successful and surgery is appropriate.

## 2023-05-05 NOTE — Assessment & Plan Note (Signed)
Topamax did not suppress her appetite but did get her off sugary sodas.  Will give her phentermine 37.5 for 4 months total.

## 2023-05-05 NOTE — Assessment & Plan Note (Signed)
Has frequent diarrhea following her cholecystectomy.  Would like viberzi

## 2023-05-06 ENCOUNTER — Telehealth: Payer: Self-pay

## 2023-05-06 DIAGNOSIS — K591 Functional diarrhea: Secondary | ICD-10-CM

## 2023-05-06 MED ORDER — AMITRIPTYLINE HCL 25 MG PO TABS
25.0000 mg | ORAL_TABLET | Freq: Every day | ORAL | 0 refills | Status: DC
Start: 2023-05-06 — End: 2023-11-04

## 2023-05-08 NOTE — Progress Notes (Signed)
  Subjective:  Patient ID: Krystal Kelley, female    DOB: 02-19-1971,  MRN: [id number]  Chief Complaint  Patient presents with   Routine Post Op    Pt is here for her last routine post op visit after surgery on right achilles tendon and to get injection on her left foot for pain.    DOS: 02/25/2023 Procedure: EPF, Achilles tenolysis and PRP injection and gastrocnemius recession  53 y.o. female returns for post-op check.  Overall doing well, the left heel is painful  Review of Systems: Negative except as noted in the HPI. Denies N/V/F/Ch.   Objective:  There were no vitals filed for this visit. Body mass index is 46 kg/m. Constitutional Well developed. Well nourished.  Vascular Foot warm and well perfused. Capillary refill normal to all digits.  Calf is soft and supple, no posterior calf or knee pain, negative Homans' sign  Neurologic Normal speech. Oriented to person, place, and time. Epicritic sensation to light touch grossly present bilaterally.  Dermatologic Incisions well-healed not hypertrophic  Orthopedic: She has no pain to palpation noted about the surgical site.  She does have pain on the plantar medial insertion of the left heel    Assessment:  No diagnosis found. Plan:  Patient was evaluated and treated and all questions answered.  S/p foot surgery left -Progressing as expected post-operatively.  May gradually resume weightbearing activity and regular shoe gear.  Begin home PT and plan was given for her..    Regarding her left foot pain she still is having quite a bit of plantar fasciitis pain and discomfort on the insertion of the plantar medial band of the plantar fascia.  She requested injection therapy of this today to alleviate pain and inflammation.After sterile prep with povidone-iodine solution and alcohol, the left heel was injected with 0.5cc 2% xylocaine plain, 0.5cc 0.5% marcaine plain, 5mg  triamcinolone acetonide, and 2mg  dexamethasone was injected  along the medial plantar fascia at the insertion on the plantar calcaneus. The patient tolerated the procedure well without complication.  Return in about 1 month (around 06/04/2023) for post op (no x-rays).

## 2023-05-09 NOTE — Telephone Encounter (Signed)
Dr. Girtha Rm sent in amitriptyline as alternative required by patient's insurance.

## 2023-05-11 ENCOUNTER — Ambulatory Visit: Payer: 59 | Admitting: Obstetrics and Gynecology

## 2023-05-17 DIAGNOSIS — M65311 Trigger thumb, right thumb: Secondary | ICD-10-CM | POA: Insufficient documentation

## 2023-06-02 ENCOUNTER — Ambulatory Visit (INDEPENDENT_AMBULATORY_CARE_PROVIDER_SITE_OTHER): Payer: 59 | Admitting: Family Medicine

## 2023-06-02 ENCOUNTER — Encounter: Payer: Self-pay | Admitting: Family Medicine

## 2023-06-02 VITALS — BP 146/81 | HR 92 | Temp 97.7°F | Resp 16 | Ht 65.0 in | Wt 287.0 lb

## 2023-06-02 DIAGNOSIS — H6120 Impacted cerumen, unspecified ear: Secondary | ICD-10-CM | POA: Insufficient documentation

## 2023-06-02 DIAGNOSIS — M65311 Trigger thumb, right thumb: Secondary | ICD-10-CM

## 2023-06-02 DIAGNOSIS — H6123 Impacted cerumen, bilateral: Secondary | ICD-10-CM | POA: Diagnosis not present

## 2023-06-02 DIAGNOSIS — I10 Essential (primary) hypertension: Secondary | ICD-10-CM | POA: Diagnosis not present

## 2023-06-02 MED ORDER — PHENTERMINE HCL 37.5 MG PO CAPS: ORAL_CAPSULE | ORAL | 0 refills | Status: DC

## 2023-06-02 NOTE — Assessment & Plan Note (Addendum)
She is on HCTZ 25 mg and potassium chloride 20 mEq daily.  BP elevated today.  Will follow.  If no improvement will add amlodipine 5mg .

## 2023-06-02 NOTE — Assessment & Plan Note (Signed)
She has seen [facility] and had an Injection in her thumb.  The triggering is better.

## 2023-06-02 NOTE — Progress Notes (Signed)
   Established Patient Office Visit  Subjective   Patient ID: Krystal Kelley, female    DOB: 1970/11/16  Age: 53 y.o. MRN: [id number]  Chief Complaint  Patient presents with   Medical Management of Chronic Issues    HPI  53 year old with breast cancer, obesity, hypertension, trigger fingers, plantar fasciitis and osteoarthritis of the knees  She is on phentermine 37.5 mg 1 a day to help suppress her appetite.  Have advised she can take this for 4 consecutive months and then will have to stop.  This is her second month. She has ear wax in both ears that is bothering her.     ROS    Objective:     BP (!) 146/81 (BP Location: Right Arm, Patient Position: Sitting, Cuff Size: Large)   Pulse 92   Temp 97.7 F (36.5 C) (Oral)   Resp 16   Ht 5\' 5"  (1.651 m) Comment: per chart  Wt 287 lb (130.2 kg)   LMP 03/21/2018   SpO2 98%   BMI 47.76 kg/m    Physical Exam Vitals and nursing note reviewed.  Constitutional:      Appearance: Normal appearance.  HENT:     Head: Normocephalic and atraumatic.     Right Ear: There is impacted cerumen.     Left Ear: There is impacted cerumen.  Eyes:     Conjunctiva/sclera: Conjunctivae normal.  Cardiovascular:     Rate and Rhythm: Normal rate and regular rhythm.  Pulmonary:     Effort: Pulmonary effort is normal.     Breath sounds: Normal breath sounds.  Musculoskeletal:     Right lower leg: No edema.     Left lower leg: No edema.  Skin:    General: Skin is warm and dry.  Neurological:     Mental Status: She is alert and oriented to person, place, and time.  Psychiatric:        Mood and Affect: Mood normal.        Behavior: Behavior normal.        Thought Content: Thought content normal.        Judgment: Judgment normal.      No results found for any visits on 06/02/23.    The 10-year ASCVD risk score (Arnett DK, et al., 2019) is: 4.6%    Assessment & Plan:   Problem List Items Addressed This Visit        Cardiovascular and Mediastinum   Hypertension - Primary   She is on HCTZ 25 mg and potassium chloride 20 mEq daily.  BP elevated today.  Will follow.  If no improvement will add amlodipine 5mg .        Relevant Orders   CBC with Differential   CMP14+EGFR   Lipid panel   TSH + free T4     Nervous and Auditory   Cerumen impaction   Bilateral ears.   Right ear has hard cerumen Advised to get Debrox and use this daily for a week.  Then return and we will rinse ear. Left ear:           Other   Morbid obesity (HCC)   Phentermine 37.5mg  once daily.  This is the second of four months of phentermine.        Relevant Medications   phentermine 37.5 MG capsule    Return in about 4 weeks (around 06/30/2023).    Alease Medina, MD

## 2023-06-02 NOTE — Assessment & Plan Note (Addendum)
Bilateral ears.   Right ear has hard cerumen Advised to get Debrox and use this daily for a week.  Then return and we will rinse ear. Left ear:

## 2023-06-02 NOTE — Assessment & Plan Note (Signed)
Phentermine 37.5mg  once daily.  This is the second of four months of phentermine.

## 2023-06-03 ENCOUNTER — Inpatient Hospital Stay: Payer: 59 | Admitting: Adult Health

## 2023-06-03 ENCOUNTER — Encounter: Payer: Self-pay | Admitting: Family Medicine

## 2023-06-03 LAB — CBC WITH DIFFERENTIAL/PLATELET
Basophils Absolute: 0 10*3/uL (ref 0.0–0.2)
Basos: 0 %
EOS (ABSOLUTE): 0.1 10*3/uL (ref 0.0–0.4)
Eos: 1 %
Hematocrit: 39.8 % (ref 34.0–46.6)
Hemoglobin: 13 g/dL (ref 11.1–15.9)
Immature Grans (Abs): 0 10*3/uL (ref 0.0–0.1)
Immature Granulocytes: 0 %
Lymphocytes Absolute: 2.8 10*3/uL (ref 0.7–3.1)
Lymphs: 35 %
MCH: 30.3 pg (ref 26.6–33.0)
MCHC: 32.7 g/dL (ref 31.5–35.7)
MCV: 93 fL (ref 79–97)
Monocytes Absolute: 0.6 10*3/uL (ref 0.1–0.9)
Monocytes: 8 %
Neutrophils Absolute: 4.5 10*3/uL (ref 1.4–7.0)
Neutrophils: 56 %
Platelets: 383 10*3/uL (ref 150–450)
RBC: 4.29 x10E6/uL (ref 3.77–5.28)
RDW: 13.3 % (ref 11.7–15.4)
WBC: 8.1 10*3/uL (ref 3.4–10.8)

## 2023-06-03 LAB — TSH+FREE T4
Free T4: 1.2 ng/dL (ref 0.82–1.77)
TSH: 2.59 u[IU]/mL (ref 0.450–4.500)

## 2023-06-03 LAB — CMP14+EGFR
ALT: 32 [IU]/L (ref 0–32)
AST: 24 [IU]/L (ref 0–40)
Albumin: 3.9 g/dL (ref 3.8–4.9)
Alkaline Phosphatase: 85 [IU]/L (ref 44–121)
BUN/Creatinine Ratio: 13 (ref 9–23)
BUN: 11 mg/dL (ref 6–24)
Bilirubin Total: 0.3 mg/dL (ref 0.0–1.2)
CO2: 29 mmol/L (ref 20–29)
Calcium: 9.5 mg/dL (ref 8.7–10.2)
Chloride: 101 mmol/L (ref 96–106)
Creatinine, Ser: 0.86 mg/dL (ref 0.57–1.00)
Globulin, Total: 3 g/dL (ref 1.5–4.5)
Glucose: 87 mg/dL (ref 70–99)
Potassium: 4.3 mmol/L (ref 3.5–5.2)
Sodium: 141 mmol/L (ref 134–144)
Total Protein: 6.9 g/dL (ref 6.0–8.5)
eGFR: 81 mL/min/{1.73_m2} (ref >59)

## 2023-06-03 LAB — LIPID PANEL
Chol/HDL Ratio: 2.4 {ratio} (ref 0.0–4.4)
Cholesterol, Total: 196 mg/dL (ref 100–199)
HDL: 81 mg/dL (ref >39)
LDL Chol Calc (NIH): 98 mg/dL (ref 0–99)
Triglycerides: 94 mg/dL (ref 0–149)
VLDL Cholesterol Cal: 17 mg/dL (ref 5–40)

## 2023-06-06 ENCOUNTER — Inpatient Hospital Stay: Payer: 59 | Attending: Adult Health | Admitting: Adult Health

## 2023-06-06 ENCOUNTER — Other Ambulatory Visit: Payer: Self-pay | Admitting: *Deleted

## 2023-06-06 ENCOUNTER — Telehealth: Payer: Self-pay | Admitting: *Deleted

## 2023-06-06 ENCOUNTER — Ambulatory Visit (HOSPITAL_BASED_OUTPATIENT_CLINIC_OR_DEPARTMENT_OTHER)
Admission: RE | Admit: 2023-06-06 | Discharge: 2023-06-06 | Disposition: A | Discharge status: Home / Self Care | Payer: 59 | Source: Ambulatory Visit | Attending: Adult Health | Admitting: Adult Health

## 2023-06-06 VITALS — BP 146/80 | HR 101 | Temp 98.1°F | Resp 19 | Ht 65.0 in | Wt 289.3 lb

## 2023-06-06 DIAGNOSIS — Z170 Estrogen receptor positive status [ER+]: Secondary | ICD-10-CM

## 2023-06-06 DIAGNOSIS — C50311 Malignant neoplasm of lower-inner quadrant of right female breast: Secondary | ICD-10-CM | POA: Insufficient documentation

## 2023-06-06 DIAGNOSIS — R222 Localized swelling, mass and lump, trunk: Secondary | ICD-10-CM | POA: Diagnosis not present

## 2023-06-06 DIAGNOSIS — M7989 Other specified soft tissue disorders: Secondary | ICD-10-CM

## 2023-06-06 DIAGNOSIS — Z853 Personal history of malignant neoplasm of breast: Secondary | ICD-10-CM | POA: Insufficient documentation

## 2023-06-06 NOTE — Progress Notes (Signed)
Upper extremity venous duplex completed. Please see CV Procedures for preliminary results.  Shona Simpson, RVT 06/06/23 2:57 PM

## 2023-06-06 NOTE — Telephone Encounter (Signed)
Greg from Vascular lab called stating US of upper left extremity is negative for blood clot.  Pt stated to him she " was to have another procedure done" .  Noted order for [place] of soft tissue head/neck not yet scheduled.  Informed Tammy Sours to tell pt she should be receiving a call from [employer] per above.  This note will be forwarded to [facility]/NP for review of negative doppler scan.

## 2023-06-07 ENCOUNTER — Other Ambulatory Visit: Payer: Self-pay | Admitting: Adult Health

## 2023-06-07 ENCOUNTER — Telehealth: Payer: Self-pay | Admitting: *Deleted

## 2023-06-07 DIAGNOSIS — M9901 Segmental and somatic dysfunction of cervical region: Secondary | ICD-10-CM | POA: Diagnosis not present

## 2023-06-07 DIAGNOSIS — Z170 Estrogen receptor positive status [ER+]: Secondary | ICD-10-CM

## 2023-06-07 DIAGNOSIS — M9902 Segmental and somatic dysfunction of thoracic region: Secondary | ICD-10-CM | POA: Diagnosis not present

## 2023-06-07 DIAGNOSIS — C50311 Malignant neoplasm of lower-inner quadrant of right female breast: Secondary | ICD-10-CM

## 2023-06-07 DIAGNOSIS — M7989 Other specified soft tissue disorders: Secondary | ICD-10-CM

## 2023-06-07 DIAGNOSIS — M5451 Vertebrogenic low back pain: Secondary | ICD-10-CM | POA: Diagnosis not present

## 2023-06-07 DIAGNOSIS — M9903 Segmental and somatic dysfunction of lumbar region: Secondary | ICD-10-CM | POA: Diagnosis not present

## 2023-06-07 DIAGNOSIS — M546 Pain in thoracic spine: Secondary | ICD-10-CM | POA: Diagnosis not present

## 2023-06-07 DIAGNOSIS — M9904 Segmental and somatic dysfunction of sacral region: Secondary | ICD-10-CM | POA: Diagnosis not present

## 2023-06-07 DIAGNOSIS — M5413 Radiculopathy, cervicothoracic region: Secondary | ICD-10-CM | POA: Diagnosis not present

## 2023-06-07 DIAGNOSIS — M461 Sacroiliitis, not elsewhere classified: Secondary | ICD-10-CM | POA: Diagnosis not present

## 2023-06-07 NOTE — Telephone Encounter (Signed)
-----   Message from Noreene Filbert sent at 06/07/2023  8:55 AM EST ----- With the doppler being negative, please tell patient I would recommend consideration of CT chest to evaluate the left arm swelling and ensure no obstructive cause in the lymph node or elsewhere for her left arm to swell. She still needs to get the ultrasound of her soft tissue scheduled as well.   Thanks, LC ----- Message ----- From: [employer], [employer] Sent: 06/06/2023   3:21 PM EST To: Loa Socks, NP

## 2023-06-07 NOTE — Progress Notes (Signed)
Ordered CT chest to evaluate and r/o metastatic breast cancer in patient with arm swelling and negative ultrasound.   Lillard Anes, NP 06/07/23 11:47 AM Medical Oncology and Hematology [facility] [address] [place], [place] [address] Tel. [phone]    Fax. [phone]

## 2023-06-07 NOTE — Telephone Encounter (Signed)
Per Lillard Anes, DNP, called pt with message below. Pt did agree to doing CT of Chest for further evaluation. Provider made aware and pt verbalized understanding.

## 2023-06-08 ENCOUNTER — Ambulatory Visit: Payer: 59 | Admitting: Podiatry

## 2023-06-09 NOTE — Assessment & Plan Note (Signed)
History of right breast cancer.  No sign of local recurrence.  -Continue annual mammograms -Continue f/u annually for clinical breast exam.   Post-operative foot recovery Patient underwent foot surgery on November 8th for a chronic foot issue. Currently experiencing tenderness at the surgical site and difficulty wearing regular shoes. -Continue use of Moderna on surgical site. -Encourage gradual reintroduction of regular footwear as tolerated.  Edema in left arm Noted significant size difference between left and right arm, possibly due to previous port placement for chemotherapy. No pain reported. -Order Doppler ultrasound of left arm to rule out blood clot. -Consider referral to specialist if necessary.  Back pain Patient reported a lump on her back, possibly due to recent use of crutches and knee scooter. No pain reported. -Order ultrasound of back to evaluate lump.  General Health Maintenance -Continue annual mammograms. -Encourage gradual reintroduction of exercise as tolerated post foot surgery. -Follow up with primary care provider for weight management.  F/u in 1 year or sooner if needed based on radiology findings, or other concerns.

## 2023-06-09 NOTE — Progress Notes (Signed)
[facility] Cancer Follow up:    Krystal Kelley, Krystal Phillips, MD [address] [place] [address]   DIAGNOSIS:  Cancer Staging  Breast cancer of lower-inner quadrant of right female breast [facility]) Staging form: Breast, AJCC 7th Edition - Clinical stage from 05/28/2014: Stage IA (T1c, N0, M0) - Unsigned Laterality: Right - Pathologic stage from 06/18/2014: Stage IA (T1c, N0, cM0) - Unsigned Laterality: Right   SUMMARY OF ONCOLOGIC HISTORY: Oncology History  Breast cancer of lower-inner quadrant of right female breast (HCC)  05/14/2014 Mammogram   Right mammogram: 1.5 cm round mass in right breast at 3:00   05/14/2014 Breast US   Right breast: irregular, hypoechoic mass with indistinct margins at 3:00 mearying 1.1 x 0.8 x 1.3 cm.   05/16/2014 Initial Biopsy   Ultrasound-guided biopsy: Invasive ductal carcinoma with DCIS, grade 1-2, ER/PR positive, Ki-67 43%, HER-2 positive   05/28/2014 Breast MRI   1.4 x 1.2 x 1.0 cm biopsy-proven invasive ductal carcinoma and ductal carcinoma in situ in the lower inner quadrant of the right breast.    05/28/2014 Clinical Stage   Stage IA: T1c N0   06/13/2014 Procedure   Breast/Next: Variant of Unknown Significance in the ATM gene called p.4477C>T, p.L1493F. Otherwise, no clinically significant mutation at ATM, BARD1, BRCA1, BRCA2, BRIP1, CDH1, CHEK2, MRE11A, MUTYH, NBN, NF1, PALB2, PTEN, RAD50, RAD51C, RAD51D, and TP53   06/18/2014 Surgery   Right breast lumpectomy: Invasive ductal carcinoma 1.3 cm negative for LVI; DCIS 1 mm from margins, 1 sentinel node negative, grade 3 ER 95%, PR 77%, HER-2 amplified ratio 8.25, Ki-67 43%   06/18/2014 Pathologic Stage   Stage IA: T1c N0 M0   07/18/2014 - 10/03/2014 Chemotherapy   Abraxane Herceptin weekly 12 followed by Herceptin maintenance every 3 weeks to complete one year completed 07/03/2015   10/30/2014 - 12/17/2014 Radiation Therapy   Adjuvant radiation therapy: Right breast 50.4 gray in 28 fractions,  lumpectomy cavity boost 12 gray in 6 fractions   02/01/2015 -  Anti-estrogen oral therapy   Tamoxifen 20 mg daily, switched to anastrozole on 09/12/18 after hysterectomy      CURRENT THERAPY:  INTERVAL HISTORY:  Discussed the use of AI scribe software for clinical note transcription with the patient, who gave verbal consent to proceed.  Krystal Kelley 53 y.o. female with a history of cancer and recent foot surgery, presents with swelling in the left arm and a lump on the back. The patient reports that the left arm is noticeably larger than the right, and this has been noticed for a few months. The patient also mentions a lump on the back, which was noticed by her primary care provider, Dr. Norva Riffle. The patient denies any pain in the lump area but reports a feeling of inflammation.  In addition to the swelling and lump, the patient reports recent foot surgery due to a persistent issue that had been treated since the previous year. The patient describes the post-surgery recovery as challenging, with the scar still tender and a sensation of tearing in certain spots. The patient has been using Moderna on the scar and wearing Uggs to avoid pressure on the foot. The patient also mentions issues with the right knee due to the use of a knee scooter during recovery from foot surgery.  The patient is currently seeing her primary care provider for weight management and is trying to increase physical activity as the foot heals. The patient reports difficulty in getting motivated to go to  the gym and is considering trying the bike at the gym. The patient also mentions a cramp in the neck and plans to see a chiropractor for alignment.   Patient Active Problem List   Diagnosis Date Noted   Cerumen impaction 06/02/2023   Trigger finger of right thumb 05/17/2023   Morbid obesity (HCC) 05/05/2023   Hypertension 05/05/2023   Diarrhea, functional 05/05/2023   Encounter for screening colonoscopy  03/18/2020   Post-operative state 05/22/2018   Posttraumatic stress disorder 12/09/2016   Genetic testing 07/10/2014   Breast cancer of lower-inner quadrant of right female breast (HCC) 05/29/2014    has no known allergies.  MEDICAL HISTORY: Past Medical History:  Diagnosis Date   Anxiety    Breast cancer, right breast (HCC) 2016   Depression    H/O laparoscopy    History of hysteroscopy    HSV infection    Peripheral edema    on lasix   PONV (postoperative nausea and vomiting)    Radiation 10/30/14-12/17/14   right breast 50.4 gray    SURGICAL HISTORY: Past Surgical History:  Procedure Laterality Date   BREAST BIOPSY  05/16/14   BREAST LUMPECTOMY Right 06/18/2014   bunion surgrery     CARPAL TUNNEL RELEASE Left 09/21/2016   Procedure: LEFT CARPAL TUNNEL RELEASE;  Surgeon: Cindee Salt, MD;  Location: [facility];  Service: Orthopedics;  Laterality: Left;  REG/FAB   carpel tunnel     CHOLECYSTECTOMY  2015   COLONOSCOPY WITH PROPOFOL N/A 05/27/2020   Procedure: COLONOSCOPY WITH PROPOFOL;  Surgeon: Midge Minium, MD;  Location: [facility] ENDOSCOPY;  Service: Endoscopy;  Laterality: N/A;  PATIENT COVID POSITIVE 04/18/2021   HYSTERECTOMY ABDOMINAL WITH SALPINGO-OOPHORECTOMY  05/22/2018   Procedure: HYSTERECTOMY ABDOMINAL WITH BILATERAL SALPINGO-OOPHORECTOMY;  Surgeon: Genia Del, MD;  Location: [facility];  Service: Gynecology;;   PLANTAR FASCIECTOMY     PORTACATH PLACEMENT Left 06/18/2014   Procedure: INSERTION PORT-A-CATH;  Surgeon: Glenna Fellows, MD;  Location: [facility];  Service: General;  Laterality: Left;   ROBOTIC ASSISTED LAPAROSCOPIC LYSIS OF ADHESION  05/22/2018   Procedure: XI ROBOTIC ASSISTED LAPAROSCOPIC LYSIS OF ADHESION;  Surgeon: Genia Del, MD;  Location:  [place];  Service: Gynecology;;   ROBOTIC ASSISTED TOTAL HYSTERECTOMY WITH BILATERAL SALPINGO OOPHERECTOMY Bilateral 05/22/2018   Procedure:  ATTEMPTED XI ROBOTIC ASSISTED TOTAL HYSTERECTOMY WITH BILATERAL SALPINGO OOPHORECTOMY;  Surgeon: Genia Del, MD;  Location: [facility] ;  Service: Gynecology;  Laterality: Bilateral;    SOCIAL HISTORY: Social History   Socioeconomic History   Marital status: Divorced    Spouse name: Not on file   Number of children: 0   Years of education: Not on file   Highest education level: Not on file  Occupational History   Occupation: accounts receivable at [employer]  Tobacco Use   Smoking status: Never   Smokeless tobacco: Never  Vaping Use   Vaping status: Never Used  Substance and Sexual Activity   Alcohol use: Yes    Comment: wine- occ    Drug use: No   Sexual activity: Not Currently    Birth control/protection: Surgical, Abstinence    Comment: 1st intercourse- 16, partners more than 5, hysterectomy  Other Topics Concern   Not on file  Social History Narrative   Not on file   Social Drivers of Health   Financial Resource Strain: Not on file  Food Insecurity: No Food Insecurity (04/22/2021)   Hunger Vital Sign    Worried  About Running Out of Food in the Last Year: Never true    Ran Out of Food in the Last Year: Never true  Transportation Needs: No Transportation Needs (06/02/2023)   PRAPARE - Administrator, Civil Service (Medical): No    Lack of Transportation (Non-Medical): No  Physical Activity: Not on file  Stress: Not on file  Social Connections: Not on file  Intimate Partner Violence: Not At Risk (06/02/2023)   Humiliation, Afraid, Rape, and Kick questionnaire    Fear of Current or Ex-Partner: No    Emotionally Abused: No    Physically Abused: No    Sexually Abused: No    FAMILY HISTORY: Family History  Problem Relation Age of Onset   Kidney cancer Father        kidney   Prostate cancer Maternal Uncle        deceased 25   Lung cancer Maternal Grandfather        deceased 26; smoker   Colon cancer Paternal Grandfather         deceased 6s   Diabetes Other    Stroke Other    Hyperlipidemia Other    Hypertension Other    COPD Other    Cancer Other        pat half-sister; ? uterine vs. ovarian ca    Review of Systems  Constitutional:  Negative for appetite change, chills, fatigue, fever and unexpected weight change.  HENT:   Negative for hearing loss, lump/mass and trouble swallowing.   Eyes:  Negative for eye problems and icterus.  Respiratory:  Negative for chest tightness, cough and shortness of breath.   Cardiovascular:  Negative for chest pain, leg swelling and palpitations.  Gastrointestinal:  Negative for abdominal distention, abdominal pain, constipation, diarrhea, nausea and vomiting.  Endocrine: Negative for hot flashes.  Genitourinary:  Negative for difficulty urinating.   Musculoskeletal:  Negative for arthralgias.  Skin:  Negative for itching and rash.  Neurological:  Negative for dizziness, extremity weakness, headaches and numbness.  Hematological:  Negative for adenopathy. Does not bruise/bleed easily.  Psychiatric/Behavioral:  Negative for depression. The patient is not nervous/anxious.       PHYSICAL EXAMINATION    Vitals:   06/06/23 1327  BP: (!) 146/80  Pulse: (!) 101  Resp: 19  Temp: 98.1 F (36.7 C)  SpO2: 100%    Physical Exam Constitutional:      General: She is not in acute distress.    Appearance: Normal appearance. She is not toxic-appearing.  HENT:     Head: Normocephalic and atraumatic.     Mouth/Throat:     Mouth: Mucous membranes are moist.     Pharynx: Oropharynx is clear. No oropharyngeal exudate or posterior oropharyngeal erythema.  Eyes:     General: No scleral icterus. Cardiovascular:     Rate and Rhythm: Normal rate and regular rhythm.     Pulses: Normal pulses.     Heart sounds: Normal heart sounds.  Pulmonary:     Effort: Pulmonary effort is normal.     Breath sounds: Normal breath sounds.  Chest:     Comments: Right breast s/p lumpectomy and  radiation, no sign of local recurrence, left breast benign Abdominal:     General: Abdomen is flat. Bowel sounds are normal. There is no distension.     Palpations: Abdomen is soft.     Tenderness: There is no abdominal tenderness.  Musculoskeletal:        General: Swelling (left upper  arm + swelling, soft area behind back + fluctuance and swelling, no TTP) present.     Cervical back: Neck supple.  Lymphadenopathy:     Cervical: No cervical adenopathy.     Upper Body:     Right upper body: No axillary adenopathy.     Left upper body: No axillary adenopathy.  Skin:    General: Skin is warm and dry.     Findings: No rash.  Neurological:     General: No focal deficit present.     Mental Status: She is alert.  Psychiatric:        Mood and Affect: Mood normal.        Behavior: Behavior normal.      ASSESSMENT and THERAPY PLAN:   Breast cancer of lower-inner quadrant of right female breast History of right breast cancer.  No sign of local recurrence.  -Continue annual mammograms -Continue f/u annually for clinical breast exam.   Post-operative foot recovery Patient underwent foot surgery on November 8th for a chronic foot issue. Currently experiencing tenderness at the surgical site and difficulty wearing regular shoes. -Continue use of Moderna on surgical site. -Encourage gradual reintroduction of regular footwear as tolerated.  Edema in left arm Noted significant size difference between left and right arm, possibly due to previous port placement for chemotherapy. No pain reported. -Order Doppler ultrasound of left arm to rule out blood clot. -Consider referral to specialist if necessary.  Back pain Patient reported a lump on her back, possibly due to recent use of crutches and knee scooter. No pain reported. -Order ultrasound of back to evaluate lump.  General Health Maintenance -Continue annual mammograms. -Encourage gradual reintroduction of exercise as tolerated post  foot surgery. -Follow up with primary care provider for weight management.  F/u in 1 year or sooner if needed based on radiology findings, or other concerns.     All questions were answered. The patient knows to call the clinic with any problems, questions or concerns. We can certainly see the patient much sooner if necessary.  Total encounter time:30 minutes*in face-to-face visit time, chart review, lab review, care coordination, order entry, and documentation of the encounter time.    Lillard Anes, NP 06/09/23 12:41 PM Medical Oncology and Hematology [facility] [address] [place], [place] [address] Tel. [phone]    Fax. [phone]  *Total Encounter Time as defined by the Centers for Medicare and Medicaid Services includes, in addition to the face-to-face time of a patient visit (documented in the note above) non-face-to-face time: obtaining and reviewing outside history, ordering and reviewing medications, tests or procedures, care coordination (communications with other health care professionals or caregivers) and documentation in the medical record.

## 2023-06-10 ENCOUNTER — Ambulatory Visit (HOSPITAL_COMMUNITY)
Admission: RE | Admit: 2023-06-10 | Discharge: 2023-06-10 | Disposition: A | Discharge status: Home / Self Care | Payer: 59 | Source: Ambulatory Visit | Attending: Adult Health | Admitting: Adult Health

## 2023-06-10 DIAGNOSIS — Z170 Estrogen receptor positive status [ER+]: Secondary | ICD-10-CM | POA: Diagnosis present

## 2023-06-10 DIAGNOSIS — R2231 Localized swelling, mass and lump, right upper limb: Secondary | ICD-10-CM | POA: Diagnosis not present

## 2023-06-10 DIAGNOSIS — C50311 Malignant neoplasm of lower-inner quadrant of right female breast: Secondary | ICD-10-CM | POA: Diagnosis present

## 2023-06-14 DIAGNOSIS — M9902 Segmental and somatic dysfunction of thoracic region: Secondary | ICD-10-CM | POA: Diagnosis not present

## 2023-06-14 DIAGNOSIS — M9901 Segmental and somatic dysfunction of cervical region: Secondary | ICD-10-CM | POA: Diagnosis not present

## 2023-06-14 DIAGNOSIS — M5413 Radiculopathy, cervicothoracic region: Secondary | ICD-10-CM | POA: Diagnosis not present

## 2023-06-14 DIAGNOSIS — M9903 Segmental and somatic dysfunction of lumbar region: Secondary | ICD-10-CM | POA: Diagnosis not present

## 2023-06-14 DIAGNOSIS — M546 Pain in thoracic spine: Secondary | ICD-10-CM | POA: Diagnosis not present

## 2023-06-15 ENCOUNTER — Encounter (HOSPITAL_COMMUNITY): Payer: Self-pay

## 2023-06-15 ENCOUNTER — Ambulatory Visit (HOSPITAL_COMMUNITY)
Admission: RE | Admit: 2023-06-15 | Discharge: 2023-06-15 | Disposition: A | Discharge status: Home / Self Care | Payer: 59 | Source: Ambulatory Visit | Attending: Adult Health | Admitting: Adult Health

## 2023-06-15 DIAGNOSIS — Z170 Estrogen receptor positive status [ER+]: Secondary | ICD-10-CM

## 2023-06-15 DIAGNOSIS — C50311 Malignant neoplasm of lower-inner quadrant of right female breast: Secondary | ICD-10-CM

## 2023-06-15 DIAGNOSIS — M7989 Other specified soft tissue disorders: Secondary | ICD-10-CM

## 2023-06-15 MED ORDER — IOHEXOL 300 MG/ML  SOLN: 75.0000 mL | Freq: Once | INTRAMUSCULAR | Status: DC | PRN

## 2023-06-21 DIAGNOSIS — M1711 Unilateral primary osteoarthritis, right knee: Secondary | ICD-10-CM | POA: Diagnosis not present

## 2023-06-27 ENCOUNTER — Ambulatory Visit (INDEPENDENT_AMBULATORY_CARE_PROVIDER_SITE_OTHER): Payer: 59 | Admitting: Podiatry

## 2023-06-27 DIAGNOSIS — M722 Plantar fascial fibromatosis: Secondary | ICD-10-CM | POA: Diagnosis not present

## 2023-06-27 MED ORDER — TRIAMCINOLONE ACETONIDE 40 MG/ML IJ SUSP
20.0000 mg | Freq: Once | INTRAMUSCULAR | Status: AC
Start: 2023-06-27 — End: 2023-06-27
  Administered 2023-06-27: 20 mg

## 2023-06-27 NOTE — Progress Notes (Signed)
 She presents today for a follow-up of her plantar fasciitis of her left foot.  Is also having some tenderness on palpation and with shoe gear on her posterior Achilles incision.  She states that is scarred up a little bit but seems to be getting better.  Objective: Vitals are stable alert needed x 3.  Pulses are palpable.  Pain on palpation medial calcaneal tubercle of her left heel.  The scar on the posterior aspect of the right heel is slightly thickened but freely movable sidle think is that he is down.  Assessment: Plantar fasciitis left thick scar on the right posterior heel.  Plan: At this point I injected her left heel plantarly 20 mg Kenalog Thylox Marcaine.  I also recommended silicone sheeting for her scars.

## 2023-06-28 ENCOUNTER — Ambulatory Visit: Payer: 59 | Admitting: Obstetrics and Gynecology

## 2023-06-28 DIAGNOSIS — M1711 Unilateral primary osteoarthritis, right knee: Secondary | ICD-10-CM | POA: Diagnosis not present

## 2023-06-28 DIAGNOSIS — M222X1 Patellofemoral disorders, right knee: Secondary | ICD-10-CM | POA: Diagnosis not present

## 2023-06-28 DIAGNOSIS — M25561 Pain in right knee: Secondary | ICD-10-CM | POA: Diagnosis not present

## 2023-06-30 ENCOUNTER — Ambulatory Visit (INDEPENDENT_AMBULATORY_CARE_PROVIDER_SITE_OTHER): Payer: 59 | Admitting: Family Medicine

## 2023-06-30 ENCOUNTER — Telehealth: Payer: Self-pay | Admitting: Family Medicine

## 2023-06-30 ENCOUNTER — Encounter: Payer: Self-pay | Admitting: Family Medicine

## 2023-06-30 VITALS — BP 149/105 | HR 104 | Temp 97.8°F | Resp 18 | Ht 65.0 in | Wt 292.0 lb

## 2023-06-30 DIAGNOSIS — Z170 Estrogen receptor positive status [ER+]: Secondary | ICD-10-CM

## 2023-06-30 DIAGNOSIS — I10 Essential (primary) hypertension: Secondary | ICD-10-CM | POA: Diagnosis not present

## 2023-06-30 DIAGNOSIS — C50311 Malignant neoplasm of lower-inner quadrant of right female breast: Secondary | ICD-10-CM

## 2023-06-30 DIAGNOSIS — H6121 Impacted cerumen, right ear: Secondary | ICD-10-CM

## 2023-06-30 MED ORDER — BLOOD PRESSURE CUFF MISC
0 refills | Status: DC
Start: 2023-06-30 — End: 2023-07-28

## 2023-06-30 MED ORDER — WEGOVY 0.25 MG/0.5ML ~~LOC~~ SOAJ
0.2500 mg | SUBCUTANEOUS | 0 refills | Status: DC
Start: 2023-06-30 — End: 2023-07-28

## 2023-06-30 NOTE — Assessment & Plan Note (Signed)
 She is supposed to be getting a CT scan of her chest but has postponed this because the scanner looked too small for her.  Advised that she needs to call her Oncologist and get her scan moved to a larger unit.

## 2023-06-30 NOTE — Progress Notes (Signed)
 Established Patient Office Visit  Subjective   Patient ID: Krystal Kelley, female    DOB: 1971/01/08  Age: 53 y.o. MRN: [id number]  Chief Complaint  Patient presents with   Hypertension    Hypertension  She is taking HCTZ 25 mg daily but not Klor-Con 20 mEq.  She was erroneously told to take that only on the days she takes Lasix.  She very seldom takes Lasix.  Blood pressure is elevated at 146/81. She would like to continue phentermine for weight loss.  She is up 5 pounds from her last office visit a month ago. She has impacted cerumen in her right ear and would like that irrigated if possible.  She did put earwax removal drops in her right ear on one occasion.    ROS    Objective:     BP (!) 149/105 (BP Location: Right Arm, Patient Position: Sitting, Cuff Size: Large)   Pulse (!) 104   Temp 97.8 F (36.6 C) (Oral)   Resp 18   Ht 5\' 5"  (1.651 m)   Wt 292 lb (132.5 kg)   LMP 03/21/2018   SpO2 96%   BMI 48.59 kg/m    Physical Exam Vitals reviewed.  Constitutional:      Appearance: Normal appearance.  HENT:     Head: Normocephalic.     Right Ear: There is impacted cerumen.  Eyes:     General:        Right eye: No discharge.        Left eye: No discharge.  Cardiovascular:     Rate and Rhythm: Normal rate.  Pulmonary:     Effort: Pulmonary effort is normal.  Neurological:     Mental Status: She is alert and oriented to person, place, and time.  Psychiatric:        Mood and Affect: Mood normal.        Behavior: Behavior normal.        Thought Content: Thought content normal.        Judgment: Judgment normal.          No results found for any visits on 06/30/23.    The 10-year ASCVD risk score (Arnett DK, et al., 2019) is: 4.8%    Assessment & Plan:  Impacted cerumen of right ear Assessment & Plan: Asked to have her ear irrigated then changed her mind and states she did not have enough time.  She will return another day to have this  done.   Primary hypertension Assessment & Plan: Marginal control of her blood pressure with HCTZ 25 mg and Klor-Con 20 mEq daily.  Asked her to start checking her blood pressure at home.  Orders: -     Blood Pressure Cuff; Check blood pressure at home once or twice a day after sitting relaxed for 15 minutes  Dispense: 1 each; Refill: 0  Malignant neoplasm of lower-inner quadrant of right breast of female, estrogen receptor positive (HCC) Assessment & Plan: She is supposed to be getting a CT scan of her chest but has postponed this because the scanner looked too small for her.  Advised that she needs to call her Oncologist and get her scan moved to a larger unit.     Morbid obesity (HCC) Assessment & Plan: Advise she should not take phentermine with marginal control of her blood pressure.  We can discuss the GLP-1 injections if she would like.      No follow-ups on file.    Darl Pikes  Vangie Bicker, MD

## 2023-06-30 NOTE — Addendum Note (Signed)
 Addended by: Alease Medina on: 06/30/2023 03:50 PM   Modules accepted: Orders

## 2023-06-30 NOTE — Assessment & Plan Note (Addendum)
 Asked to have her ear irrigated then changed her mind and states she did not have enough time.  She will return another day to have this done.

## 2023-06-30 NOTE — Assessment & Plan Note (Signed)
 Advise she should not take phentermine with marginal control of her blood pressure.  We can discuss the GLP-1 injections if she would like.

## 2023-06-30 NOTE — Telephone Encounter (Signed)
 Called patient and advised that  A GL-1 medication  might be the best way for her to loose weight.  Medicaid has gotten better about paying for these lately,  Cannot promise that her insurance will cover but can try.   She agreed to try these.

## 2023-06-30 NOTE — Assessment & Plan Note (Signed)
 Marginal control of her blood pressure with HCTZ 25 mg and Klor-Con 20 mEq daily.  Asked her to start checking her blood pressure at home.

## 2023-07-01 ENCOUNTER — Telehealth: Payer: Self-pay

## 2023-07-01 NOTE — Telephone Encounter (Signed)
(  Key: Dallie Piles)  Rx #: [id number]  QIONGE 0.25MG /0.5ML auto-injectors  Form  [employer] Medicare Part D Electronic Prior Authorization Form (2017 NCPDP)

## 2023-07-04 ENCOUNTER — Telehealth: Payer: Self-pay | Admitting: Family Medicine

## 2023-07-04 NOTE — Telephone Encounter (Signed)
 Copied from CRM [id number]. Topic: Clinical - Prescription Issue >> Jul 04, 2023  2:15 PM Antwanette L wrote: Reason for CRM: The patient is calling in because [employer] is requesting a note from Dr. Girtha Rm on why the patient needs Wegovy. The patient can be contacted by phone at [phone] See CRM

## 2023-07-06 NOTE — Telephone Encounter (Signed)
(  Key: Dallie Piles)  Rx #: [id number]  JYNWGN 0.25MG /0.5ML auto-injectors  Form [employer] Medicare Part D Electronic Prior Authorization Form (2017 NCPDP)  Message from Plan  Request Reference Number: [id number]. WEGOVY INJ 0.25MG  is denied due to Plan Exclusion. For further questions, call [phone].

## 2023-07-11 MED ORDER — VALACYCLOVIR HCL 500 MG PO TABS: 500.0000 mg | ORAL_TABLET | Freq: Two times a day (BID) | ORAL | 3 refills | Status: AC | PRN

## 2023-07-11 NOTE — Addendum Note (Signed)
 Addended by: Tonny Bollman on: 07/11/2023 08:01 AM   Modules accepted: Orders

## 2023-07-11 NOTE — Telephone Encounter (Signed)
 Appeal has been initiated with [employer]. Appeals specialist states that turn around time is 7 days.  Ref # [id number].

## 2023-07-11 NOTE — Telephone Encounter (Signed)
 Valacyclovir refill has been sent to [facility]

## 2023-07-15 ENCOUNTER — Telehealth: Payer: Self-pay | Admitting: Adult Health

## 2023-07-15 NOTE — Telephone Encounter (Signed)
 Scheduled appointment per 2/17 los. Talked with the patient and she is aware of the made appointment.

## 2023-07-18 ENCOUNTER — Telehealth: Payer: Self-pay | Admitting: *Deleted

## 2023-07-18 ENCOUNTER — Other Ambulatory Visit: Payer: Self-pay | Admitting: *Deleted

## 2023-07-18 DIAGNOSIS — C50311 Malignant neoplasm of lower-inner quadrant of right female breast: Secondary | ICD-10-CM

## 2023-07-18 DIAGNOSIS — M7989 Other specified soft tissue disorders: Secondary | ICD-10-CM

## 2023-07-18 DIAGNOSIS — Z170 Estrogen receptor positive status [ER+]: Secondary | ICD-10-CM

## 2023-07-18 NOTE — Telephone Encounter (Signed)
 Called pt to make aware that new order was placed for CT of chest to accommodate weight limit. Gave office information to call and schedule. Pt verbalized understanding.

## 2023-07-19 DIAGNOSIS — M65311 Trigger thumb, right thumb: Secondary | ICD-10-CM | POA: Diagnosis not present

## 2023-07-21 NOTE — Addendum Note (Signed)
 Encounter addended by: Roanna Epley on: 07/21/2023 4:38 PM  Actions taken: LDA properties accepted

## 2023-07-26 ENCOUNTER — Ambulatory Visit
Admission: RE | Admit: 2023-07-26 | Discharge: 2023-07-26 | Disposition: A | Discharge status: Home / Self Care | Source: Ambulatory Visit | Attending: Adult Health | Admitting: Adult Health

## 2023-07-26 DIAGNOSIS — C50311 Malignant neoplasm of lower-inner quadrant of right female breast: Secondary | ICD-10-CM

## 2023-07-26 DIAGNOSIS — K760 Fatty (change of) liver, not elsewhere classified: Secondary | ICD-10-CM | POA: Diagnosis not present

## 2023-07-26 DIAGNOSIS — R222 Localized swelling, mass and lump, trunk: Secondary | ICD-10-CM | POA: Diagnosis not present

## 2023-07-26 DIAGNOSIS — M7989 Other specified soft tissue disorders: Secondary | ICD-10-CM

## 2023-07-26 DIAGNOSIS — R911 Solitary pulmonary nodule: Secondary | ICD-10-CM | POA: Diagnosis not present

## 2023-07-26 DIAGNOSIS — I700 Atherosclerosis of aorta: Secondary | ICD-10-CM | POA: Diagnosis not present

## 2023-07-26 MED ORDER — IOPAMIDOL (ISOVUE-370) INJECTION 76%
80.0000 mL | Freq: Once | INTRAVENOUS | Status: AC | PRN
  Administered 2023-07-26: 80 mL via INTRAVENOUS

## 2023-07-27 ENCOUNTER — Ambulatory Visit (INDEPENDENT_AMBULATORY_CARE_PROVIDER_SITE_OTHER): Admitting: Podiatry

## 2023-07-27 VITALS — BP 154/95

## 2023-07-27 DIAGNOSIS — M722 Plantar fascial fibromatosis: Secondary | ICD-10-CM

## 2023-07-27 MED ORDER — TRIAMCINOLONE ACETONIDE 40 MG/ML IJ SUSP
40.0000 mg | Freq: Once | INTRAMUSCULAR | Status: AC
Start: 2023-07-27 — End: 2023-07-27
  Administered 2023-07-27: 40 mg

## 2023-07-27 NOTE — Progress Notes (Signed)
 She presents today for follow-up of her Planter fasciitis bilaterally.  States that is starting to bother her again.  She is also wanting her blood pressure taken today.  States that she is having a problem with her right knee.  She is having issues with her family at this point.  Objective: Vital signs are stable alert oriented x 3.  Pulses are palpable her blood pressure is elevated 154/95 sitting in chair style.  Pulses are palpable she has pain on palpation medial calcaneal tubercles bilateral.  Assessment: Plantar fasciitis bilateral.  Hypertension.  Plan: Injected 10 mg bilateral heels.  Follow-up with her in 6 weeks

## 2023-07-28 ENCOUNTER — Encounter: Payer: Self-pay | Admitting: Obstetrics and Gynecology

## 2023-07-28 ENCOUNTER — Ambulatory Visit (INDEPENDENT_AMBULATORY_CARE_PROVIDER_SITE_OTHER): Admitting: Obstetrics and Gynecology

## 2023-07-28 VITALS — BP 138/78 | HR 96 | Temp 98.1°F | Ht 65.0 in | Wt 293.0 lb

## 2023-07-28 DIAGNOSIS — B009 Herpesviral infection, unspecified: Secondary | ICD-10-CM | POA: Diagnosis not present

## 2023-07-28 DIAGNOSIS — Z1331 Encounter for screening for depression: Secondary | ICD-10-CM | POA: Diagnosis not present

## 2023-07-28 DIAGNOSIS — Z9189 Other specified personal risk factors, not elsewhere classified: Secondary | ICD-10-CM | POA: Diagnosis not present

## 2023-07-28 DIAGNOSIS — Z01419 Encounter for gynecological examination (general) (routine) without abnormal findings: Secondary | ICD-10-CM | POA: Insufficient documentation

## 2023-07-28 DIAGNOSIS — Z131 Encounter for screening for diabetes mellitus: Secondary | ICD-10-CM | POA: Diagnosis not present

## 2023-07-28 DIAGNOSIS — Z853 Personal history of malignant neoplasm of breast: Secondary | ICD-10-CM

## 2023-07-28 DIAGNOSIS — Z13820 Encounter for screening for osteoporosis: Secondary | ICD-10-CM

## 2023-07-28 NOTE — Assessment & Plan Note (Signed)
 Cervical cancer screening no longer indicated Encouraged annual mammogram screening Colonoscopy up-to-date DXA: History of anastrozole use, consider earlier screening Labs and immunizations with her primary Encouraged safe sexual practices as indicated Encouraged healthy lifestyle practices with diet and exercise For patients under 50-54yo, I recommend 1200mg  calcium daily and 600IU of vitamin D daily.

## 2023-07-28 NOTE — Patient Instructions (Signed)

## 2023-07-28 NOTE — Addendum Note (Signed)
 Addended by: Darrell Jewel V on: 07/28/2023 11:06 AM   Modules accepted: Orders

## 2023-07-28 NOTE — Progress Notes (Addendum)
 53 y.o. G0P0000 female s/p RA TLH, BSO (2020, AUB-F), H/O Rt Breast Cancer (dx 2016, s/p lumpectomy and chemo RT, no longer on Anastrozole, followed by Dr Pamelia Hoit) here for annual exam-high risk Medicare. Divorced.  Patient's last menstrual period was 03/21/2018.   No complaints today.  Abnormal bleeding: none Pelvic discharge or pain: none Breast mass, nipple discharge or skin changes : none Last PAP:     Component Value Date/Time   DIAGPAP  03/24/2022 1339    - Negative for intraepithelial lesion or malignancy (NILM)   HPVHIGH Negative 03/24/2022 1339   ADEQPAP Satisfactory for evaluation. 03/24/2022 1339   Last mammogram: 10/08/2022 BI-RADS 1, density a Last colonoscopy: 05/27/2020 Sexually active: No Exercising: No Smoker: No  Flowsheet Row Office Visit from 07/28/2023 in [facility]  PHQ-2 Total Score 0       Flowsheet Row Office Visit from 06/02/2023 in [facility]  PHQ-9 Total Score 1       GYN HISTORY: Right breast cancer, 2016 RA TLH, 2020  OB History  Gravida Para Term Preterm AB Living  0 0 0 0 0 0  SAB IAB Ectopic Multiple Live Births  0 0 0 0 0    Past Medical History:  Diagnosis Date   Anxiety    Breast cancer, right breast (HCC) 2016   Depression    H/O laparoscopy    History of hysteroscopy    HSV infection    Peripheral edema    on lasix   PONV (postoperative nausea and vomiting)    Radiation 10/30/14-12/17/14   right breast 50.4 gray    Past Surgical History:  Procedure Laterality Date   BREAST BIOPSY  05/16/2014   BREAST LUMPECTOMY Right 06/18/2014   bunion surgrery     CARPAL TUNNEL RELEASE Left 09/21/2016   Procedure: LEFT CARPAL TUNNEL RELEASE;  Surgeon: Cindee Salt, MD;  Location: [facility];  Service: Orthopedics;  Laterality: Left;  REG/FAB   carpel tunnel     CHOLECYSTECTOMY  2015   COLONOSCOPY WITH PROPOFOL N/A 05/27/2020   Procedure: COLONOSCOPY WITH  PROPOFOL;  Surgeon: Midge Minium, MD;  Location: [facility];  Service: Endoscopy;  Laterality: N/A;  PATIENT COVID POSITIVE 04/18/2021   FOOT SURGERY  2024   HYSTERECTOMY ABDOMINAL WITH SALPINGO-OOPHORECTOMY  05/22/2018   Procedure: HYSTERECTOMY ABDOMINAL WITH BILATERAL SALPINGO-OOPHORECTOMY;  Surgeon: Genia Del, MD;  Location: [facility] [place];  Service: Gynecology;;   PLANTAR FASCIECTOMY     PORTACATH PLACEMENT Left 06/18/2014   Procedure: INSERTION PORT-A-CATH;  Surgeon: Glenna Fellows, MD;  Location: [facility];  Service: General;  Laterality: Left;   ROBOTIC ASSISTED LAPAROSCOPIC LYSIS OF ADHESION  05/22/2018   Procedure: XI ROBOTIC ASSISTED LAPAROSCOPIC LYSIS OF ADHESION;  Surgeon: Genia Del, MD;  Location: [facility];  Service: Gynecology;;   ROBOTIC ASSISTED TOTAL HYSTERECTOMY WITH BILATERAL SALPINGO OOPHERECTOMY Bilateral 05/22/2018   Procedure: ATTEMPTED XI ROBOTIC ASSISTED TOTAL HYSTERECTOMY WITH BILATERAL SALPINGO OOPHORECTOMY;  Surgeon: Genia Del, MD;  Location: [facility];  Service: Gynecology;  Laterality: Bilateral;    Current Outpatient Medications on File Prior to Visit  Medication Sig Dispense Refill   amitriptyline (ELAVIL) 25 MG tablet Take 1 tablet (25 mg total) by mouth at bedtime. 90 tablet 0   cycloSPORINE (RESTASIS) 0.05 % ophthalmic emulsion Place 1 drop into both eyes 2 (two) times daily.     Fluocinolone Acetonide Body (DERMA-SMOOTHE/FS BODY) 0.01 % OIL  Apply to body and extremities after showering once daily on Monday, Wednesday, and Friday. 118.28 mL 5   hydrochlorothiazide (HYDRODIURIL) 25 MG tablet Take 25 mg by mouth daily.     ketoconazole (NIZORAL) 2 % cream Apply to aa's QD. 60 g 5   ketoconazole (NIZORAL) 2 % shampoo Shampoo into the skin let sit 5-10 minute then wash out. Use 2-3 days per week. 120 mL 5   mometasone (ELOCON) 0.1 % cream Apply 1 Application topically  daily as needed (Rash). 45 g 1   potassium chloride SA (KLOR-CON M) 20 MEQ tablet Take 20 mEq by mouth daily.     valACYclovir (VALTREX) 500 MG tablet Take 1 tablet (500 mg total) by mouth 2 (two) times daily as needed (outbreaks.). 60 tablet 3   furosemide (LASIX) 20 MG tablet Take 1 tablet (20 mg total) by mouth daily for 3 days. 3 tablet 0   No current facility-administered medications on file prior to visit.    Social History   Socioeconomic History   Marital status: Divorced    Spouse name: Not on file   Number of children: 0   Years of education: Not on file   Highest education level: Not on file  Occupational History   Occupation: accounts receivable at [employer]  Tobacco Use   Smoking status: Never   Smokeless tobacco: Never  Vaping Use   Vaping status: Never Used  Substance and Sexual Activity   Alcohol use: Yes    Comment: wine- occ    Drug use: No   Sexual activity: Not Currently    Birth control/protection: Surgical, Abstinence    Comment: 1st intercourse- 16, partners more than 5, hysterectomy  Other Topics Concern   Not on file  Social History Narrative   Not on file   Social Drivers of Health   Financial Resource Strain: Not on file  Food Insecurity: No Food Insecurity (04/22/2021)   Hunger Vital Sign    Worried About Running Out of Food in the Last Year: Never true    Ran Out of Food in the Last Year: Never true  Transportation Needs: No Transportation Needs (06/02/2023)   PRAPARE - Administrator, Civil Service (Medical): No    Lack of Transportation (Non-Medical): No  Physical Activity: Not on file  Stress: Not on file  Social Connections: Not on file  Intimate Partner Violence: Not At Risk (06/02/2023)   Humiliation, Afraid, Rape, and Kick questionnaire    Fear of Current or Ex-Partner: No    Emotionally Abused: No    Physically Abused: No    Sexually Abused: No    Family History  Problem Relation Age of Onset   Kidney cancer Father         kidney   Prostate cancer Maternal Uncle        deceased 29   Lung cancer Maternal Grandfather        deceased 24; smoker   Colon cancer Paternal Grandfather        deceased 37s   Diabetes Other    Stroke Other    Hyperlipidemia Other    Hypertension Other    COPD Other    Cancer Other        pat half-sister; ? uterine vs. ovarian ca    No Known Allergies   PE Today's Vitals   07/28/23 1024  BP: 138/78  Pulse: 96  Temp: 98.1 F (36.7 C)  TempSrc: Oral  SpO2: 99%  Weight: 293  lb (132.9 kg)  Height: 5\' 5"  (1.651 m)   Body mass index is 48.76 kg/m.  Physical Exam Vitals reviewed. Exam conducted with a chaperone present.  Constitutional:      General: She is not in acute distress.    Appearance: Normal appearance.  HENT:     Head: Normocephalic and atraumatic.     Nose: Nose normal.  Eyes:     Extraocular Movements: Extraocular movements intact.     Conjunctiva/sclera: Conjunctivae normal.  Neck:     Thyroid: No thyroid mass, thyromegaly or thyroid tenderness.  Pulmonary:     Effort: Pulmonary effort is normal.  Chest:     Chest wall: No mass or tenderness.  Breasts:    Right: Normal. No swelling, mass, nipple discharge, skin change or tenderness.     Left: Normal. No swelling, mass, nipple discharge, skin change or tenderness.     Comments: Right lumpectomy scar Abdominal:     General: There is no distension.     Palpations: Abdomen is soft.     Tenderness: There is no abdominal tenderness.  Genitourinary:    General: Normal vulva.     Exam position: Lithotomy position.     Urethra: No prolapse.     Vagina: Normal. No vaginal discharge or bleeding.     Cervix: Normal. No lesion.     Uterus: Normal. Not enlarged and not tender.      Adnexa: Right adnexa normal and left adnexa normal.  Musculoskeletal:        General: Normal range of motion.     Cervical back: Normal range of motion.  Lymphadenopathy:     Upper Body:     Right upper body: No  axillary adenopathy.     Left upper body: No axillary adenopathy.     Lower Body: No right inguinal adenopathy. No left inguinal adenopathy.  Skin:    General: Skin is warm and dry.  Neurological:     General: No focal deficit present.     Mental Status: She is alert.  Psychiatric:        Mood and Affect: Mood normal.        Behavior: Behavior normal.      Assessment and Plan:        Well woman exam with routine gynecological exam Assessment & Plan: Cervical cancer screening no longer indicated Encouraged annual mammogram screening Colonoscopy up-to-date DXA: History of anastrozole use, consider earlier screening Labs and immunizations with her primary Encouraged safe sexual practices as indicated Encouraged healthy lifestyle practices with diet and exercise For patients under 50-70yo, I recommend 1200mg  calcium daily and 600IU of vitamin D daily.    Personal history of breast cancer  Other specified personal risk factors, not elsewhere classified  Negative depression screening  Screening for osteoporosis -     DG Bone Density; Future  Diabetes mellitus screening   Rosalyn Gess, MD

## 2023-08-01 ENCOUNTER — Encounter: Payer: Self-pay | Admitting: Family Medicine

## 2023-08-01 ENCOUNTER — Ambulatory Visit (INDEPENDENT_AMBULATORY_CARE_PROVIDER_SITE_OTHER): Admitting: Family Medicine

## 2023-08-01 VITALS — BP 158/98 | HR 102 | Temp 98.5°F | Resp 18 | Ht 65.0 in | Wt 296.0 lb

## 2023-08-01 DIAGNOSIS — C50311 Malignant neoplasm of lower-inner quadrant of right female breast: Secondary | ICD-10-CM

## 2023-08-01 DIAGNOSIS — I10 Essential (primary) hypertension: Secondary | ICD-10-CM

## 2023-08-01 DIAGNOSIS — Z170 Estrogen receptor positive status [ER+]: Secondary | ICD-10-CM | POA: Diagnosis not present

## 2023-08-01 DIAGNOSIS — J4520 Mild intermittent asthma, uncomplicated: Secondary | ICD-10-CM | POA: Insufficient documentation

## 2023-08-01 DIAGNOSIS — M65311 Trigger thumb, right thumb: Secondary | ICD-10-CM

## 2023-08-01 MED ORDER — HYDROCHLOROTHIAZIDE 25 MG PO TABS
25.0000 mg | ORAL_TABLET | Freq: Every day | ORAL | 1 refills | Status: DC
Start: 2023-08-01 — End: 2023-08-26

## 2023-08-01 NOTE — Assessment & Plan Note (Signed)
 Her left arm is larger than her right arm.  Her oncologist ordered a CT scan to evaluate.  She got this scan this past week.  US  was negative for a UE DVT

## 2023-08-01 NOTE — Assessment & Plan Note (Addendum)
 She states that she is taking her hydrochlorothiazide 25mg  and klorCon 20meq daily.  Will check her BASIC today.  Second check of her BP in the left arm was 137/86.  Advised that I would like to start Norvasc 2.5mg  and get her pressure in the 120's.  She declined another BP medication.  She would like to work on weight loss and exercise and see what effect she can have.  Asked her to get a BP cuff.  Measured her arm so she can get the appropriate cuff:  17.5 inches or 44 cm.  Reviewed how to take a BP correctly.  FU monthly.

## 2023-08-01 NOTE — Assessment & Plan Note (Signed)
 Re-sent her appeal for Wegovy to her insurance and added her BMI of 49+ to her form.  Already had HTN as her comorbidity.

## 2023-08-01 NOTE — Progress Notes (Addendum)
 Established Patient Office Visit  Subjective   Patient ID: Krystal Kelley, female    DOB: [birth year]  Age: 53 y.o. MRN: [id number]  Chief Complaint  Patient presents with   Medical Management of Chronic Issues    HPI [name] 53 yo with breast cancer (05/28/2014: Stage Ia: T1c, N0, M0, ER/PR positive, HER2 positive), HTN, obesity, plantar fasciitis bilaterally, OA of the knees, right thumb trigger, Hx ruptured Achilles tendon, s/p cholecystectomy. She has gotten her blood pressure checked when she has been to her oncologist, podiatrist and has noted her blood pressures running in the 140s over 90s.  We are using an adult large cuff and her right arm (the side of her breast cancer) her blood pressure is running in the 150s over 90s but her left arm is running in the 130s over 90s. She is joining a gym that has a pool so she can have free swim time. Her left arm is visibly larger than the right.  She is getting a CT scan to examine this.  She did get her CT scan this past week.  She has also had a [place] that did not show a problem with her veins.   She has obesity with BMI 49.26 and HTN.  Her insurance company did not cover [employer].  We filed an appeal and she was still denied.  She would like the appeal re-sent and add the information of her BMI as a co-mobidity.  We had her co-morbidilty as her HTN.     ROS    Objective:     BP (!) 158/98 (BP Location: Left Arm, Patient Position: Sitting, Cuff Size: Large)   Pulse (!) 102   Temp 98.5 F (36.9 C)   Resp 18   Ht 5\' 5"  (1.651 m)   Wt 296 lb (134.3 kg)   LMP 03/21/2018   SpO2 98%   BMI 49.26 kg/m    Physical Exam Vitals and nursing note reviewed.  Constitutional:      Appearance: Normal appearance.  HENT:     Head: Normocephalic and atraumatic.  Eyes:     Conjunctiva/sclera: Conjunctivae normal.  Cardiovascular:     Rate and Rhythm: Normal rate and regular rhythm.  Pulmonary:     Effort: Pulmonary effort is normal.      Breath sounds: Normal breath sounds.  Musculoskeletal:     Right lower leg: No edema.     Left lower leg: No edema.  Skin:    General: Skin is warm and dry.  Neurological:     Mental Status: She is alert and oriented to person, place, and time.  Psychiatric:        Mood and Affect: Mood normal.        Behavior: Behavior normal.        Thought Content: Thought content normal.        Judgment: Judgment normal.          No results found for any visits on 08/01/23.    The 10-year ASCVD risk score (Arnett DK, et al., 2019) is: 5.9%    Assessment & Plan:  Primary hypertension Assessment & Plan: She states that she is taking her hydrochlorothiazide 25mg  and klorCon daily.  Will check her BASIC today.  Second check of her BP in the left arm was 137/86.  Advised that I would like to start Norvasc 2.5mg  and get her pressure in the 120's.  She declined another BP medication.  She would like to work  on weight loss and exercise and see what effect she can have.  Asked her to get a BP cuff.  Measured her arm so she can get the appropriate cuff:  17.5 inches or 44 cm.  Reviewed how to take a BP correctly.  FU monthly.  Orders: -     hydroCHLOROthiazide; Take 1 tablet (25 mg total) by mouth daily.  Dispense: 90 tablet; Refill: 1 -     Basic metabolic panel with GFR -     Microalbumin / creatinine urine ratio  Morbid obesity (HCC) Assessment & Plan: Re-sent her appeal for Wegovy to her insurance and added her BMI of 49+ to her form.  Already had HTN as her comorbidity.     Malignant neoplasm of lower-inner quadrant of right breast of female, estrogen receptor positive (HCC) Assessment & Plan: Her left arm is larger than her right arm.  Her oncologist ordered a CT scan to evaluate.  She got this scan this past week.  US  was negative for a UE DVT   Trigger finger of right thumb Assessment & Plan: She needs surgery for her trigger thumb      Return in about 1 month (around  08/31/2023).    Mayreli Alden K Quaniyah Bugh, MD

## 2023-08-01 NOTE — Assessment & Plan Note (Signed)
 She needs surgery for her trigger thumb

## 2023-08-02 ENCOUNTER — Telehealth: Payer: Self-pay

## 2023-08-02 ENCOUNTER — Encounter: Payer: Self-pay | Admitting: Family Medicine

## 2023-08-02 DIAGNOSIS — Z01818 Encounter for other preprocedural examination: Secondary | ICD-10-CM | POA: Diagnosis not present

## 2023-08-02 LAB — BASIC METABOLIC PANEL WITH GFR
BUN/Creatinine Ratio: 13 (ref 9–23)
BUN: 12 mg/dL (ref 6–24)
CO2: 25 mmol/L (ref 20–29)
Calcium: 9.7 mg/dL (ref 8.7–10.2)
Chloride: 100 mmol/L (ref 96–106)
Creatinine, Ser: 0.89 mg/dL (ref 0.57–1.00)
Glucose: 89 mg/dL (ref 70–99)
Potassium: 4.7 mmol/L (ref 3.5–5.2)
Sodium: 140 mmol/L (ref 134–144)
eGFR: 77 mL/min/{1.73_m2} (ref >59)

## 2023-08-02 LAB — MICROALBUMIN / CREATININE URINE RATIO
Creatinine, Urine: 33.3 mg/dL
Microalb/Creat Ratio: <9 mg/g{creat} (ref 0–29)
Microalbumin, Urine: <3 ug/mL

## 2023-08-02 NOTE — Telephone Encounter (Signed)
 Called and given below message. She verbalized understanding and would like to repeat CT in 3 months.

## 2023-08-02 NOTE — Telephone Encounter (Signed)
-----   Message from Conway Dennis sent at 08/01/2023  3:12 PM EDT ----- Please let patient know that CT chest was negative however it did show some very small lung nodules.  Will need to repeat CT chest in 3 to 6 months. ----- Message ----- From: Interface, Rad Results In Sent: 08/01/2023  11:41 AM EDT To: Percival Brace, NP

## 2023-08-02 NOTE — Telephone Encounter (Signed)
 Attempted to call, voicemail full and unable to leave a message.

## 2023-08-03 ENCOUNTER — Other Ambulatory Visit: Payer: Self-pay | Admitting: *Deleted

## 2023-08-03 DIAGNOSIS — C50311 Malignant neoplasm of lower-inner quadrant of right female breast: Secondary | ICD-10-CM

## 2023-08-03 DIAGNOSIS — Z170 Estrogen receptor positive status [ER+]: Secondary | ICD-10-CM

## 2023-08-04 ENCOUNTER — Other Ambulatory Visit: Payer: Self-pay

## 2023-08-12 DIAGNOSIS — Z859 Personal history of malignant neoplasm, unspecified: Secondary | ICD-10-CM | POA: Diagnosis not present

## 2023-08-12 DIAGNOSIS — M65311 Trigger thumb, right thumb: Secondary | ICD-10-CM | POA: Diagnosis not present

## 2023-08-23 DIAGNOSIS — M9903 Segmental and somatic dysfunction of lumbar region: Secondary | ICD-10-CM | POA: Diagnosis not present

## 2023-08-23 DIAGNOSIS — M9902 Segmental and somatic dysfunction of thoracic region: Secondary | ICD-10-CM | POA: Diagnosis not present

## 2023-08-23 DIAGNOSIS — M5413 Radiculopathy, cervicothoracic region: Secondary | ICD-10-CM | POA: Diagnosis not present

## 2023-08-23 DIAGNOSIS — M9904 Segmental and somatic dysfunction of sacral region: Secondary | ICD-10-CM | POA: Diagnosis not present

## 2023-08-23 DIAGNOSIS — M546 Pain in thoracic spine: Secondary | ICD-10-CM | POA: Diagnosis not present

## 2023-08-23 DIAGNOSIS — M5451 Vertebrogenic low back pain: Secondary | ICD-10-CM | POA: Diagnosis not present

## 2023-08-23 DIAGNOSIS — M9901 Segmental and somatic dysfunction of cervical region: Secondary | ICD-10-CM | POA: Diagnosis not present

## 2023-08-23 DIAGNOSIS — M461 Sacroiliitis, not elsewhere classified: Secondary | ICD-10-CM | POA: Diagnosis not present

## 2023-08-24 ENCOUNTER — Telehealth: Payer: Self-pay | Admitting: Family Medicine

## 2023-08-24 DIAGNOSIS — I10 Essential (primary) hypertension: Secondary | ICD-10-CM

## 2023-08-25 NOTE — Telephone Encounter (Signed)
 No conversation recorded

## 2023-08-25 NOTE — Telephone Encounter (Unsigned)
 Copied from CRM [id number]. Topic: Clinical - Medication Refill >> Aug 25, 2023  4:50 PM DeAngela L wrote: Medication: hydrochlorothiazide  (HYDRODIURIL ) 25 MG tablet   Has the patient contacted their pharmacy? No  (Agent: If no, request that the patient contact the pharmacy for the refill. If patient does not wish to contact the pharmacy document the reason why and proceed with request.) (Agent: If yes, when and what did the pharmacy advise?)  This is the patient's preferred pharmacy:  [id number] [place], [place] - [address] AT [facility] OF [address] [place] [address] Phone: [phone] Fax: [phone]  Is this the correct pharmacy for this prescription? Yes  If no, delete pharmacy and type the correct one.   Has the prescription been filled recently? Yes   Is the patient out of the medication? Yes   Has the patient been seen for an appointment in the last year OR does the patient have an upcoming appointment? Yes   Can we respond through MyChart? Yes   Agent: Please be advised that Rx refills may take up to 3 business days. We ask that you follow-up with your pharmacy.

## 2023-08-26 MED ORDER — HYDROCHLOROTHIAZIDE 25 MG PO TABS
25.0000 mg | ORAL_TABLET | Freq: Every day | ORAL | 1 refills | Status: DC
Start: 2023-08-26 — End: 2023-11-04

## 2023-08-26 NOTE — Addendum Note (Signed)
 Addended by: Evans Him on: 08/26/2023 02:17 PM   Modules accepted: Orders

## 2023-08-26 NOTE — Telephone Encounter (Signed)
 Refill sent.

## 2023-08-30 ENCOUNTER — Telehealth: Payer: Self-pay | Admitting: Family Medicine

## 2023-08-30 DIAGNOSIS — M5413 Radiculopathy, cervicothoracic region: Secondary | ICD-10-CM | POA: Diagnosis not present

## 2023-08-30 DIAGNOSIS — M9903 Segmental and somatic dysfunction of lumbar region: Secondary | ICD-10-CM | POA: Diagnosis not present

## 2023-08-30 DIAGNOSIS — M9902 Segmental and somatic dysfunction of thoracic region: Secondary | ICD-10-CM | POA: Diagnosis not present

## 2023-08-30 DIAGNOSIS — M546 Pain in thoracic spine: Secondary | ICD-10-CM | POA: Diagnosis not present

## 2023-08-30 DIAGNOSIS — M9901 Segmental and somatic dysfunction of cervical region: Secondary | ICD-10-CM | POA: Diagnosis not present

## 2023-08-30 NOTE — Telephone Encounter (Signed)
 Patient came into office, Needs a 2nd level appeal to medicaid due denial from medicare. Medicare did an automatic denial as the 2nd appeal should have went to Medicaid. The new appeal must say 2nd level appeal. It needs to mention why and the urgency. She has a high BMI, High BP, Prevent Stroke and Hearth Attack. She needs the it faxed directly to [phone]. Please advise patient when the appeal is completed. [phone],

## 2023-08-31 NOTE — Telephone Encounter (Signed)
 Shonte from [facility] appeals department states the denial and next level appeal has to go through [employer]. Representative will fax forms that is needed.

## 2023-09-01 ENCOUNTER — Encounter: Payer: Self-pay | Admitting: Family Medicine

## 2023-09-01 ENCOUNTER — Ambulatory Visit: Admitting: Family Medicine

## 2023-09-02 NOTE — Telephone Encounter (Signed)
 Copied from CRM [id number]. Topic: General - Other >> Sep 02, 2023 11:17 AM Sophia H wrote: Reason for CRM: pt returning missed call, no notes in pt chart on who it was. Pt confirmed appt for 05/19. Wants to know if anyone from office reached out to her, if so please cb and leave a vmail if no answer. Ty

## 2023-09-05 ENCOUNTER — Ambulatory Visit (INDEPENDENT_AMBULATORY_CARE_PROVIDER_SITE_OTHER): Admitting: Family Medicine

## 2023-09-05 VITALS — BP 156/100 | HR 101 | Temp 98.9°F | Resp 18 | Ht 65.0 in | Wt 299.0 lb

## 2023-09-05 DIAGNOSIS — I10 Essential (primary) hypertension: Secondary | ICD-10-CM

## 2023-09-05 DIAGNOSIS — E66813 Obesity, class 3: Secondary | ICD-10-CM

## 2023-09-05 DIAGNOSIS — Z91148 Patient's other noncompliance with medication regimen for other reason: Secondary | ICD-10-CM | POA: Diagnosis not present

## 2023-09-05 DIAGNOSIS — H6992 Unspecified Eustachian tube disorder, left ear: Secondary | ICD-10-CM | POA: Diagnosis not present

## 2023-09-05 NOTE — Progress Notes (Signed)
 Established Patient Office Visit  Subjective   Patient ID: Krystal Kelley, female    DOB: [birth year]  Age: 53 y.o. MRN: [id number]  Chief Complaint  Patient presents with   Hypertension   Weight Management Screening    HPI Krystal Kelley 53 YO with breast cancer (05/28/2014:  Stage Ia: T1c, N0, M0 ER/PR positive, HER2 positive), HTN, obesity, plantar fasciitis bilaterally, OA of the knees, right thumb trigger, Hx ruptured Achilles tendon, s/p cholecystectomy. She has not been able to get her Wegovy  covered by her insurance.  Her appeal to Medicare was denied. She request that the appeal go to Medicaid.  She has a number.[phone].  We checked the denial letter from [facility] and the phone number she has is not for Medicaid but is for Medicare.  Have advised that Medicaid does not cover her prescriptions, Medicare does.   Advised that we will do another appeal and point out that her obesity contributes to her additional medical problems of hypertension and osteoarthritis of her knees. She is taking HCTZ 25 mg and Klor-Con  20 mEq.Basic metabolic 08/01/18: 5 creatinine 0.89, sodium 140 and potassium 4.7. She reports that she has an intermittent earache in left her ear especially when she blows her nose.  No fever or sore throat and no fever.  She is taking Claritin and Allegra.       ROS    Objective:     BP (!) 156/100 (BP Location: Left Arm, Patient Position: Sitting, Cuff Size: Large)   Pulse (!) 101   Temp 98.9 F (37.2 C) (Oral)   Resp 18   Ht 5\' 5"  (1.651 m)   Wt 299 lb (135.6 kg)   LMP 03/21/2018   SpO2 95%   BMI 49.76 kg/m    Physical Exam Vitals reviewed.  Constitutional:      Appearance: Normal appearance.  HENT:     Head: Normocephalic.     Right Ear: Tympanic membrane, ear canal and external ear normal.     Left Ear: Ear canal and external ear normal. Tympanic membrane is retracted.  Eyes:     General:        Right eye: No discharge.        Left eye: No  discharge.  Pulmonary:     Effort: Pulmonary effort is normal.  Neurological:     Mental Status: She is alert and oriented to person, place, and time.  Psychiatric:        Mood and Affect: Mood normal.        Behavior: Behavior normal.        Thought Content: Thought content normal.        Judgment: Judgment normal.         No results found for any visits on 09/05/23.    The 10-year ASCVD risk score (Arnett DK, et al., 2019) is: 5.6%    Assessment & Plan:  Primary hypertension Assessment & Plan: Advised that we do not have good control of her blood pressure with just HCTZ 25 mg and Klor-Con  20 mEq daily.  Discussed starting Norvasc 5 mg a day.  Patient is adamant she does not want to start another blood pressure pill at this time.  Encouraging her to work on a low-salt diet and to get daily exercise.  She is trying to use salt less spices when she cooks.  Recommended Mrs. Dash.   Obesity, Class III, BMI 40-49.9 (morbid obesity) Assessment & Plan: She has osteoarthritis of bilateral knees and  hypertension which both result from her morbid obesity.  Patient seeks treatment with Wegovy  and working with insurance company to get this paid for.   Eustachian tube dysfunction, left Assessment & Plan: Encouraged her to use Flonase  2 sprays each nostril daily in addition to Claritin.   Noncompliance with medication regimen Assessment & Plan: Blood pressure is not well-controlled. Recommended the patient also take Norvasc 5 mg with her HCTZ 25 mg and Klor-Con  20.  Patient will not except a prescription at this time.      Return in about 4 weeks (around 10/03/2023).    Kennah Hehr K Semaj Kham, MD

## 2023-09-06 ENCOUNTER — Other Ambulatory Visit (HOSPITAL_BASED_OUTPATIENT_CLINIC_OR_DEPARTMENT_OTHER)

## 2023-09-06 DIAGNOSIS — M9903 Segmental and somatic dysfunction of lumbar region: Secondary | ICD-10-CM | POA: Diagnosis not present

## 2023-09-06 DIAGNOSIS — M9901 Segmental and somatic dysfunction of cervical region: Secondary | ICD-10-CM | POA: Diagnosis not present

## 2023-09-06 DIAGNOSIS — M546 Pain in thoracic spine: Secondary | ICD-10-CM | POA: Diagnosis not present

## 2023-09-06 DIAGNOSIS — M9902 Segmental and somatic dysfunction of thoracic region: Secondary | ICD-10-CM | POA: Diagnosis not present

## 2023-09-06 DIAGNOSIS — M5413 Radiculopathy, cervicothoracic region: Secondary | ICD-10-CM | POA: Diagnosis not present

## 2023-09-07 ENCOUNTER — Encounter: Payer: Self-pay | Admitting: Podiatry

## 2023-09-07 ENCOUNTER — Ambulatory Visit (INDEPENDENT_AMBULATORY_CARE_PROVIDER_SITE_OTHER): Admitting: Podiatry

## 2023-09-07 DIAGNOSIS — L905 Scar conditions and fibrosis of skin: Secondary | ICD-10-CM

## 2023-09-07 DIAGNOSIS — R52 Pain, unspecified: Secondary | ICD-10-CM | POA: Diagnosis not present

## 2023-09-07 NOTE — Progress Notes (Signed)
 She presents today states that her fasciitis is doing much better she is concerned about the scar on the posterior aspect of her heel from her's previous surgery.  She states that she has been wearing the silicone tape as I had suggested and the scar is doing much better.  Objective: Vital signs stable oriented x 3.  Pulses are palpable.  The scar has retracted and receded considerably since I saw it last.  Is doing very well at this point it appears to be less keloid than it was previously.  Assessment: Well-healing scar with the use of silicone scar guard.  Plan: Continue use of the silicone scar guard until completely resolved.

## 2023-09-12 DIAGNOSIS — H6992 Unspecified Eustachian tube disorder, left ear: Secondary | ICD-10-CM | POA: Insufficient documentation

## 2023-09-12 DIAGNOSIS — Z91148 Patient's other noncompliance with medication regimen for other reason: Secondary | ICD-10-CM | POA: Insufficient documentation

## 2023-09-12 DIAGNOSIS — E66813 Obesity, class 3: Secondary | ICD-10-CM | POA: Insufficient documentation

## 2023-09-12 NOTE — Assessment & Plan Note (Addendum)
 She has osteoarthritis of bilateral knees and hypertension which both result from her morbid obesity.  Patient seeks treatment with Wegovy  and working with insurance company to get this paid for.

## 2023-09-12 NOTE — Assessment & Plan Note (Signed)
 Blood pressure is not well-controlled. Recommended the patient also take Norvasc 5 mg with her HCTZ 25 mg and Klor-Con  20.  Patient will not except a prescription at this time.

## 2023-09-12 NOTE — Assessment & Plan Note (Addendum)
 Encouraged her to use Flonase  2 sprays each nostril daily in addition to Claritin.

## 2023-09-12 NOTE — Assessment & Plan Note (Addendum)
 Advised that we do not have good control of her blood pressure with just HCTZ 25 mg and Klor-Con  20 mEq daily.  Discussed starting Norvasc 5 mg a day.  Patient is adamant she does not want to start another blood pressure pill at this time.  Encouraging her to work on a low-salt diet and to get daily exercise.  She is trying to use salt less spices when she cooks.  Recommended Mrs. Dash.

## 2023-09-20 ENCOUNTER — Other Ambulatory Visit (HOSPITAL_BASED_OUTPATIENT_CLINIC_OR_DEPARTMENT_OTHER)

## 2023-09-21 ENCOUNTER — Other Ambulatory Visit (HOSPITAL_BASED_OUTPATIENT_CLINIC_OR_DEPARTMENT_OTHER)

## 2023-09-28 ENCOUNTER — Telehealth: Payer: Self-pay | Admitting: Family Medicine

## 2023-09-28 ENCOUNTER — Encounter: Payer: Self-pay | Admitting: Obstetrics and Gynecology

## 2023-09-28 NOTE — Telephone Encounter (Signed)
 Copied from CRM [id number]. Topic: General - Other >> Sep 27, 2023  2:45 PM Stanly Early wrote: Reason for CRM: patient is calling upset because she is trying to get medication wagovy, she stated [facility] send over forms for the appeals. She also mention she does have dual insurance and is not sure why Dr.Z stated otherwise. I saw in the comment that tara mentioned [employer]. Patient stated that for the PA it has to be listed under risk of heart attack & stroke.

## 2023-09-28 NOTE — Telephone Encounter (Signed)
Yes, happy to.  

## 2023-10-03 NOTE — Telephone Encounter (Signed)
 Destiny from [facility] called to verify that patient was made aware that insurance will not cover weight loss medications. Representative was informed that patient was made aware several times and provided physical copies of determination and explanation of denial.

## 2023-10-03 NOTE — Telephone Encounter (Unsigned)
 Copied from CRM [id number]. Topic: Clinical - Medication Prior Auth >> Oct 03, 2023  2:51 PM Tiffini S wrote: Reason for CRM: Destiny with [employer] called about the Wegovy  0.25mg  pending for prior auth. Patient believed that medication was prescribed for cardio  issues and not for weight loss. Need to speak with nurse.

## 2023-10-05 NOTE — Telephone Encounter (Signed)
 Copied from CRM [id number]. Topic: Clinical - Medication Prior Auth >> Oct 05, 2023  1:00 PM Sasha H wrote: Reason for CRM: Destiney from [employer] called to speak with Tera about this pt. Can call back at [phone] ext [phone]

## 2023-10-06 ENCOUNTER — Other Ambulatory Visit: Payer: Self-pay | Admitting: Family Medicine

## 2023-10-06 ENCOUNTER — Ambulatory Visit (INDEPENDENT_AMBULATORY_CARE_PROVIDER_SITE_OTHER): Admitting: Family Medicine

## 2023-10-06 ENCOUNTER — Encounter: Payer: Self-pay | Admitting: Family Medicine

## 2023-10-06 MED ORDER — TOPIRAMATE 25 MG PO TABS: 25.0000 mg | ORAL_TABLET | Freq: Two times a day (BID) | ORAL | 1 refills | Status: DC

## 2023-10-06 MED ORDER — NALTREXONE HCL 50 MG PO TABS: 50.0000 mg | ORAL_TABLET | Freq: Every day | ORAL | 1 refills | Status: DC

## 2023-10-06 MED ORDER — PHENTERMINE HCL 37.5 MG PO TABS: ORAL_TABLET | ORAL | 0 refills | Status: DC

## 2023-10-06 NOTE — Assessment & Plan Note (Signed)
 Weight is 302 and BMI is 50+.  Discussed increasing her exercise.  Discussed modifying her diet to avoid carbohydrates and sweets.  Will try naltrexone 50 mg daily to help her not crave sweets, phentermine  37.5 mg once daily and Topamax 25 mg twice daily to help with appetite suppression.

## 2023-10-06 NOTE — Progress Notes (Signed)
   Established Patient Office Visit  Subjective   Patient ID: Krystal Kelley, female    DOB: [birth year]  Age: 53 y.o. MRN: [id number]  Chief Complaint  Patient presents with   Medical Management of Chronic Issues    Patient is here today for a follow up visit. Denies any main concerns for today's visit except wants to discuss seeing if a weight loss medication could be prescribed that might be able to be covered by insurance.    HPI [name] 53 YO with breast cancer (05/28/2014: Stage Ia: T1c, N0, M0 ER/PR positive, HER2 positive), HTN, obesity, plantar fasciitis bilaterally, OA of the knees, right thumb trigger, Hx ruptured Achilles tendon, s/p cholecystectomy.   She is interested in losing weight.  She weighs 302 with a BMI of 50+.  Her insurance company will not allow her to have a GLP-1 inhibitor.  She has been on Topamax and phentermine  in the past phentermine  last filled 06/02/2023. She goes to the pool and swims on and she walks some.  Her right knee hurts and limits what she is able to do in terms of exercise.  She is trying to modify her diet by increasing water and eating more salads and fewer carbohydrates.  She does not eat a lot of starch her problems is sweets.  She likes ice cream.    ROS    Objective:     BP (!) 134/92 (BP Location: Left Arm, Patient Position: Sitting, Cuff Size: Large)   Pulse 94   Ht 5' 5 (1.651 m)   Wt (!) 302 lb 6.4 oz (137.2 kg)   LMP 03/21/2018   SpO2 95%   BMI 50.32 kg/m    Physical Exam Vitals reviewed.  Constitutional:      Appearance: Normal appearance.  HENT:     Head: Normocephalic.   Eyes:     General:        Right eye: No discharge.        Left eye: No discharge.    Cardiovascular:     Rate and Rhythm: Normal rate.  Pulmonary:     Effort: Pulmonary effort is normal.   Neurological:     Mental Status: She is alert and oriented to person, place, and time.   Psychiatric:        Mood and Affect: Mood normal.         Behavior: Behavior normal.        Thought Content: Thought content normal.        Judgment: Judgment normal.          No results found for any visits on 10/06/23.    The 10-year ASCVD risk score (Arnett DK, et al., 2019) is: 3.2%    Assessment & Plan:  Morbid obesity (HCC) Assessment & Plan: Weight is 302 and BMI is 50+.  Discussed increasing her exercise.  Discussed modifying her diet to avoid carbohydrates and sweets.  Will try naltrexone 50 mg daily to help her not crave sweets, phentermine  37.5 mg once daily and Topamax 25 mg twice daily to help with appetite suppression.  Orders: -     Naltrexone HCl; Take 1 tablet (50 mg total) by mouth daily.  Dispense: 30 tablet; Refill: 1 -     Phentermine  HCl; One tab by mouth qAM  Dispense: 30 tablet; Refill: 0     Return in about 4 weeks (around 11/03/2023).    Amyra Vantuyl K Melanee Cordial, MD

## 2023-10-10 ENCOUNTER — Other Ambulatory Visit: Payer: Self-pay | Admitting: Family Medicine

## 2023-10-10 ENCOUNTER — Telehealth: Payer: Self-pay

## 2023-10-10 ENCOUNTER — Other Ambulatory Visit (HOSPITAL_BASED_OUTPATIENT_CLINIC_OR_DEPARTMENT_OTHER)

## 2023-10-10 DIAGNOSIS — E66813 Obesity, class 3: Secondary | ICD-10-CM

## 2023-10-10 NOTE — Telephone Encounter (Signed)
 Destiney from [employer] called wanting to know where we have referred patient to be seen by a nutritionist since she was not able to get Wegovy . She stated that the patient is willing to see a nutritionist when she was here last. I then told Destiney to hold to ask Dr. Ziglar and she stated that she was more than glad to refer her to see a nutritionist but that it was not mentioned on her last office visit with her on 10/06/2023. However, I let Destiney know that Dr. Ziglar will send the referral in and patient should receive a phone call within at least 2 weeks. Destiney had no further questions.

## 2023-10-11 DIAGNOSIS — Z1231 Encounter for screening mammogram for malignant neoplasm of breast: Secondary | ICD-10-CM | POA: Diagnosis not present

## 2023-10-11 LAB — HM MAMMOGRAPHY

## 2023-10-12 ENCOUNTER — Encounter: Payer: Self-pay | Admitting: Family Medicine

## 2023-10-17 ENCOUNTER — Ambulatory Visit (INDEPENDENT_AMBULATORY_CARE_PROVIDER_SITE_OTHER): Admitting: Radiology

## 2023-10-17 ENCOUNTER — Encounter: Payer: Self-pay | Admitting: Radiology

## 2023-10-17 VITALS — BP 110/78 | HR 105 | Ht 64.5 in | Wt 296.8 lb

## 2023-10-17 DIAGNOSIS — I2510 Atherosclerotic heart disease of native coronary artery without angina pectoris: Secondary | ICD-10-CM

## 2023-10-17 DIAGNOSIS — I10 Essential (primary) hypertension: Secondary | ICD-10-CM

## 2023-10-17 DIAGNOSIS — Z6841 Body Mass Index (BMI) 40.0 and over, adult: Secondary | ICD-10-CM | POA: Diagnosis not present

## 2023-10-17 MED ORDER — WEGOVY 0.25 MG/0.5ML ~~LOC~~ SOAJ: 0.2500 mg | SUBCUTANEOUS | 1 refills | Status: DC

## 2023-10-17 NOTE — Progress Notes (Signed)
   Krystal Kelley 10-15-1970 [id number]   History:  53 y.o. G0 presents for weight management. Currently on phentermine , topamax  and naltrexone . She is worried about her heart health. Hx of breast cancer, primary hypertension on multiple medications. She walks and swims for exercise. Has a gym membership. Focuses on healthy eating. Recent chest CT shows Aortic atherosclerosis.   Gynecologic History Patient's last menstrual period was 03/21/2018. Contraception/Family planning: status post hysterectomy   Obstetric History OB History  Gravida Para Term Preterm AB Living  0 0 0 0 0 0  SAB IAB Ectopic Multiple Live Births  0 0 0 0 0      The following portions of the patient's history were reviewed and updated as appropriate: allergies, current medications, past family history, past medical history, past social history, past surgical history, and problem list.  Review of Systems  All other systems reviewed and are negative.   Past medical history, past surgical history, family history and social history were all reviewed and documented in the EPIC chart.  Exam:  Vitals:   10/17/23 1106  BP: 110/78  Pulse: (!) 105  SpO2: 98%  Weight: 296 lb 12.8 oz (134.6 kg)  Height: 5' 4.5 (1.638 m)   Body mass index is 50.16 kg/m.  Physical Exam Constitutional:      Appearance: Normal appearance. She is obese.  Pulmonary:     Effort: Pulmonary effort is normal.   Neurological:     Mental Status: She is alert.   Psychiatric:        Mood and Affect: Mood normal.        Thought Content: Thought content normal.        Judgment: Judgment normal.     Starting weight: 305 Current weight:0296 First goal:250 Overall weight loss goal:200  Assessment/Plan:   1. CVD (cardiovascular disease) (Primary) Aortic atherosclerosis  - Semaglutide -Weight Management (WEGOVY ) 0.25 MG/0.5ML SOAJ; Inject 0.25 mg into the skin once a week.  Dispense: 2 mL; Refill: 1  2. BMI 50.0-59.9, adult  (HCC) - Semaglutide -Weight Management (WEGOVY ) 0.25 MG/0.5ML SOAJ; Inject 0.25 mg into the skin once a week.  Dispense: 2 mL; Refill: 1  3. Primary hypertension - Semaglutide -Weight Management (WEGOVY ) 0.25 MG/0.5ML SOAJ; Inject 0.25 mg into the skin once a week.  Dispense: 2 mL; Refill: 1     Risks and benefits discussed. Continue to focus on protein and fiber. Small frequent meals. Adequate hydration and electrolyte replacement. Will continue to exercise for at least a week including weight bearing exercise.     Krystal Kelley B WHNP-BC 11:47 AM 10/17/2023

## 2023-10-19 ENCOUNTER — Ambulatory Visit: Admitting: Podiatry

## 2023-10-25 ENCOUNTER — Telehealth: Payer: Self-pay

## 2023-10-25 NOTE — Telephone Encounter (Signed)
 Prior authorization request received from [employer]mymeds for Wegovy  0.25mg .  PA initiated.  Patient notified. KEY: [id number] DX: 25.10 DX: Z68.43 DX: 10

## 2023-10-25 NOTE — Telephone Encounter (Signed)
 PA denied for Wegovy .  Per Jami:  drug representative will send our office an additional PA form next week.  Patient notified.

## 2023-10-26 ENCOUNTER — Ambulatory Visit (INDEPENDENT_AMBULATORY_CARE_PROVIDER_SITE_OTHER): Payer: 59 | Admitting: Dermatology

## 2023-10-26 DIAGNOSIS — L299 Pruritus, unspecified: Secondary | ICD-10-CM | POA: Diagnosis not present

## 2023-10-26 DIAGNOSIS — Z7189 Other specified counseling: Secondary | ICD-10-CM

## 2023-10-26 DIAGNOSIS — Z79899 Other long term (current) drug therapy: Secondary | ICD-10-CM

## 2023-10-26 DIAGNOSIS — B360 Pityriasis versicolor: Secondary | ICD-10-CM | POA: Diagnosis not present

## 2023-10-26 MED ORDER — KETOCONAZOLE 2 % EX CREA: TOPICAL_CREAM | CUTANEOUS | 5 refills | Status: AC

## 2023-10-26 MED ORDER — MOMETASONE FUROATE 0.1 % EX CREA: 1.0000 | TOPICAL_CREAM | Freq: Every day | CUTANEOUS | 5 refills | Status: AC | PRN

## 2023-10-26 MED ORDER — KETOCONAZOLE 2 % EX SHAM: MEDICATED_SHAMPOO | CUTANEOUS | 5 refills | Status: AC

## 2023-10-26 NOTE — Progress Notes (Signed)
   Follow-Up Visit   Subjective  Krystal Kelley is a 53 y.o. female who presents for the following: Tinea versicolor - currently using Ketoconazole  2% shampoo, Ketoconazole  2% cream, and Mometasone  0.1% which keeps condition well controlled.  The following portions of the chart were reviewed this encounter and updated as appropriate: medications, allergies, medical history  Review of Systems:  No other skin or systemic complaints except as noted in HPI or Assessment and Plan.  Objective  Well appearing patient in no apparent distress; mood and affect are within normal limits.  A focused examination was performed of the following areas: the face, trunk, and extremities  Relevant exam findings are noted in the Assessment and Plan.   Assessment & Plan   Tinea Versicolor  With pruritus - 3 minimal patches on the back.  Arms and chest are clear.  Chronic and persistent condition with duration or expected duration over one year. Condition is improving with treatment but not currently at goal.   Tinea versicolor is a chronic recurrent skin rash causing discolored scaly spots most commonly seen on back, chest, and/or shoulders.  It is generally asymptomatic. The rash is due to overgrowth of a common type of yeast present on everyone's skin and it is not contagious.  It tends to flare more in the summer due to increased sweating on trunk.  After rash is treated, the scaliness will resolve, but the discoloration will take longer to return to normal pigmentation. The periodic use of an OTC medicated soap/shampoo with zinc or selenium sulfide can be helpful to prevent yeast overgrowth and recurrence.  Continue Ketoconazole  cream twice a day as needed until clear.   May continue Mometasone  cream every day-bid prn itch.   Continue ketoconazole  shampoo as body wash once a week, especially in summer months.  Tinea versicolor is a chronic recurrent skin rash causing discolored scaly spots most  commonly seen on back, chest, and/or shoulders.  It is generally asymptomatic. The rash is due to overgrowth of a common type of yeast present on everyone's skin and it is not contagious.  It tends to flare more in the summer due to increased sweating on trunk.  After rash is treated, the scaliness will resolve, but the discoloration will take longer to return to normal pigmentation. The periodic use of an OTC medicated soap/shampoo with zinc or selenium sulfide can be helpful to prevent yeast overgrowth and recurrence. TINEA VERSICOLOR   Related Medications mometasone  (ELOCON ) 0.1 % cream Apply 1 Application topically daily as needed (Rash).  Return in about 1 year (around 10/25/2024) for tinea versicolor follow up.  LILLETTE Rosina Mayans, CMA, am acting as scribe for Alm Rhyme, MD .   Documentation: I have reviewed the above documentation for accuracy and completeness, and I agree with the above.  Alm Rhyme, MD

## 2023-10-26 NOTE — Patient Instructions (Signed)

## 2023-10-27 ENCOUNTER — Encounter: Payer: Self-pay | Admitting: Dermatology

## 2023-10-31 ENCOUNTER — Ambulatory Visit (INDEPENDENT_AMBULATORY_CARE_PROVIDER_SITE_OTHER): Admitting: Podiatry

## 2023-10-31 ENCOUNTER — Encounter: Payer: Self-pay | Admitting: Podiatry

## 2023-10-31 DIAGNOSIS — M722 Plantar fascial fibromatosis: Secondary | ICD-10-CM | POA: Diagnosis not present

## 2023-10-31 NOTE — Progress Notes (Signed)
 Presents today requesting injection to her left heel she states been really hurting and throbbing for about a month.  She has had no treatment.  She states that she knows she should be wearing flip-flops but she broke her good flip-flops.  Objective: Vital signs are stable she is alert and oriented x 3.  Pulses are palpable.  She has pain on palpation medial calcaneal tubercle of the left heel only no pain on the posterior or medial aspect of the right heel.  Assessment: Plantar fasciitis left.  Plan: Discussed etiology pathology and surgical therapies injected the left heel today 10 mg Kenalog  pentagrams Marcaine  point of maximal tenderness.  Tolerated procedure well without complications.

## 2023-11-02 ENCOUNTER — Telehealth: Payer: Self-pay

## 2023-11-02 NOTE — Telephone Encounter (Signed)
 Additional prior authorization forms  for Wegovy  0.25 mg completed, with previous PA denial and chart notes faxed to [employer]. FAX# [phone] Patient notified.

## 2023-11-02 NOTE — Telephone Encounter (Signed)
 Received 2 additional prior authorization forms for Wegovy  2.5 mg appeal.  Forms completed and faxed to [facility].

## 2023-11-03 ENCOUNTER — Ambulatory Visit: Payer: Self-pay

## 2023-11-03 ENCOUNTER — Ambulatory Visit: Admitting: Family Medicine

## 2023-11-03 DIAGNOSIS — M722 Plantar fascial fibromatosis: Secondary | ICD-10-CM | POA: Diagnosis not present

## 2023-11-03 MED ORDER — TRIAMCINOLONE ACETONIDE 40 MG/ML IJ SUSP
20.0000 mg | Freq: Once | INTRAMUSCULAR | Status: AC
  Administered 2023-11-03: 20 mg

## 2023-11-03 NOTE — Addendum Note (Signed)
 Addended by: ELAYNE KNEE E on: 11/03/2023 07:06 PM   Modules accepted: Orders

## 2023-11-03 NOTE — Telephone Encounter (Signed)
 FYI Only or Action Required?: FYI only for provider.  Patient was last seen in primary care on 10/06/2023 by Ziglar, Susan K, MD.  Called Nurse Triage reporting Otalgia.  Symptoms began several days ago.  Interventions attempted: OTC medications: debrox gtts.  Symptoms are: unchanged.  Triage Disposition: See Physician Within 24 Hours  Patient/caregiver understands and will follow disposition?: Yes Pt had OV today at 1110 but overslept d/t ear pain been bothering her. R/s appt for tomorrow at 150.   Copied from CRM [id number]. Topic: Clinical - Red Word Triage >> Nov 03, 2023 11:26 AM Avram MATSU wrote: Red Word that prompted transfer to Nurse Triage: left ear pain/soreness Reason for Disposition  Earache  (Exceptions: Brief ear pain of lasting less than 60 minutes, or earache occurring during air travel.)  Answer Assessment - Initial Assessment Questions 1. LOCATION: Which ear is involved?     L ear pain  2. ONSET: When did the ear pain start?      Several days  3. SEVERITY: How bad is the pain?  (Scale 1-10; mild, moderate or severe)     Mild to moderate  4. URI SYMPTOMS: Do you have a runny nose or cough?     no 5. FEVER: Do you have a fever? If Yes, ask: What is your temperature, how was it measured, and when did it start?     no 7. OTHER SYMPTOMS: Do you have any other symptoms? (e.g., decreased hearing, dizziness, headache, stiff neck, vomiting)     Increased pain when lying down  Protocols used: Earache-A-AH

## 2023-11-04 ENCOUNTER — Ambulatory Visit (INDEPENDENT_AMBULATORY_CARE_PROVIDER_SITE_OTHER): Admitting: Family Medicine

## 2023-11-04 ENCOUNTER — Encounter: Payer: Self-pay | Admitting: Family Medicine

## 2023-11-04 VITALS — BP 151/84 | HR 97 | Temp 98.9°F | Resp 18 | Ht 64.5 in | Wt 290.0 lb

## 2023-11-04 DIAGNOSIS — I10 Essential (primary) hypertension: Secondary | ICD-10-CM | POA: Diagnosis not present

## 2023-11-04 DIAGNOSIS — H6123 Impacted cerumen, bilateral: Secondary | ICD-10-CM | POA: Diagnosis not present

## 2023-11-04 MED ORDER — HYDROCHLOROTHIAZIDE 25 MG PO TABS: 25.0000 mg | ORAL_TABLET | Freq: Every day | ORAL | 1 refills | Status: DC

## 2023-11-04 MED ORDER — TOPIRAMATE 25 MG PO TABS: 25.0000 mg | ORAL_TABLET | Freq: Two times a day (BID) | ORAL | 1 refills | Status: DC

## 2023-11-04 MED ORDER — NALTREXONE HCL 50 MG PO TABS: 50.0000 mg | ORAL_TABLET | Freq: Every day | ORAL | 1 refills | Status: DC

## 2023-11-04 NOTE — Assessment & Plan Note (Signed)
 After recheck blood pressure still 151/84.  She is taking HCTZ 25 mg daily.  Will check her blood pressure next month and if blood pressure persistently being elevated will add amlodipine.

## 2023-11-04 NOTE — Progress Notes (Signed)
 Established Patient Office Visit  Subjective   Patient ID: Krystal Kelley, female    DOB: 1971-01-09  Age: 53 y.o. MRN: [id number]  Chief Complaint  Patient presents with   Medical Management of Chronic Issues   Ear Pain    left    HPI [name] 53 YO with breast cancer (05/28/2014: Stage Ia: T1c, N0, M0 ER/PR positive, HER2 positive), HTN, obesity, plantar fasciitis bilaterally, OA of the knees, right thumb trigger, Hx ruptured Achilles tendon, s/p cholecystectomy, colonoscopy 05/27/2020.  Her weight is down to 290.  She has lost 12 pounds in a month.  She is on phentermine  37.5, Topamax  25 mg and naltrexone  50 mg daily.  She is doing very well with this medicine and having no difficulty taking it.  She is getting protein with every meal.  She still swimming some and walking when she can. She reports that her left ear hurts a lot especially when she is lying on it.  She has got muffled sounds and thinks her ears are clogged.  We have worked to get wax out of her ears in the past.  She did use Debrox but only once in her left ear.     Objective:     BP (!) 151/84 (BP Location: Left Arm, Patient Position: Sitting, Cuff Size: Large)   Pulse 97   Temp 98.9 F (37.2 C) (Oral)   Resp 18   Ht 5' 4.5 (1.638 m)   Wt 290 lb (131.5 kg)   LMP 03/21/2018   SpO2 95%   BMI 49.01 kg/m    Physical Exam Vitals reviewed.  Constitutional:      Appearance: Normal appearance.  HENT:     Head: Normocephalic.     Right Ear: There is impacted cerumen.     Left Ear: There is impacted cerumen.     Ears:     Comments: After irrigation both TMs were normal. Eyes:     General:        Right eye: No discharge.        Left eye: No discharge.  Cardiovascular:     Rate and Rhythm: Normal rate.  Pulmonary:     Effort: Pulmonary effort is normal.  Neurological:     Mental Status: She is alert and oriented to person, place, and time.  Psychiatric:        Mood and Affect: Mood normal.         Behavior: Behavior normal.        Thought Content: Thought content normal.        Judgment: Judgment normal.          No results found for any visits on 11/04/23.    The 10-year ASCVD risk score (Arnett DK, et al., 2019) is: 5%    Assessment & Plan:  Impacted cerumen of both ears -     Ear Lavage  Morbid obesity (HCC) Assessment & Plan: On phentermine  37.5 mg, Topamax  25 mg and naltrexone  50 mg daily she was able to lose 12 pounds in a month.  However cannot refill her phentermine  because her blood pressure is not well-controlled.  Continue with Topamax  25 mg daily and naltrexone  50 mg daily.  Please reduce the carbohydrates in your diet and continue to exercise.  Checking her protein and albumin  levels today.  Orders: -     Naltrexone  HCl; Take 1 tablet (50 mg total) by mouth daily.  Dispense: 30 tablet; Refill: 1 -  Topiramate ; Take 1 tablet (25 mg total) by mouth 2 (two) times daily.  Dispense: 30 tablet; Refill: 1  Primary hypertension Assessment & Plan: After recheck blood pressure still 151/84.  She is taking HCTZ 25 mg daily.  Will check her blood pressure next month and if blood pressure persistently being elevated will add amlodipine.  Orders: -     hydroCHLOROthiazide ; Take 1 tablet (25 mg total) by mouth daily.  Dispense: 90 tablet; Refill: 1 -     CMP14+EGFR     Return in about 4 weeks (around 12/02/2023).    Chukwuemeka Artola K Graeme Menees, MD

## 2023-11-04 NOTE — Assessment & Plan Note (Signed)
 On phentermine  37.5 mg, Topamax  25 mg and naltrexone  50 mg daily she was able to lose 12 pounds in a month.  However cannot refill her phentermine  because her blood pressure is not well-controlled.  Continue with Topamax  25 mg daily and naltrexone  50 mg daily.  Please reduce the carbohydrates in your diet and continue to exercise.  Checking her protein and albumin  levels today.

## 2023-11-05 LAB — CMP14+EGFR
ALT: 26 IU/L (ref 0–32)
AST: 24 IU/L (ref 0–40)
Albumin: 4.3 g/dL (ref 3.8–4.9)
Alkaline Phosphatase: 81 IU/L (ref 44–121)
BUN/Creatinine Ratio: 11 (ref 9–23)
BUN: 12 mg/dL (ref 6–24)
Bilirubin Total: 0.4 mg/dL (ref 0.0–1.2)
CO2: 25 mmol/L (ref 20–29)
Calcium: 9.4 mg/dL (ref 8.7–10.2)
Chloride: 100 mmol/L (ref 96–106)
Creatinine, Ser: 1.06 mg/dL — ABNORMAL HIGH (ref 0.57–1.00)
Globulin, Total: 3.5 g/dL (ref 1.5–4.5)
Glucose: 90 mg/dL (ref 70–99)
Potassium: 3.6 mmol/L (ref 3.5–5.2)
Sodium: 140 mmol/L (ref 134–144)
Total Protein: 7.8 g/dL (ref 6.0–8.5)
eGFR: 63 mL/min/1.73 (ref >59)

## 2023-11-07 ENCOUNTER — Encounter: Payer: Self-pay | Admitting: Family Medicine

## 2023-11-07 ENCOUNTER — Ambulatory Visit: Payer: Self-pay | Admitting: Family Medicine

## 2023-11-08 ENCOUNTER — Telehealth: Payer: Self-pay

## 2023-11-08 NOTE — Telephone Encounter (Signed)
 Received 2 additional PA appeal forms.  Forms completed and faxed to [facility] @ [phone].

## 2023-11-14 ENCOUNTER — Telehealth: Payer: Self-pay

## 2023-11-14 ENCOUNTER — Ambulatory Visit (INDEPENDENT_AMBULATORY_CARE_PROVIDER_SITE_OTHER)

## 2023-11-14 VITALS — Ht 64.5 in | Wt 288.0 lb

## 2023-11-14 DIAGNOSIS — Z0000 Encounter for general adult medical examination without abnormal findings: Secondary | ICD-10-CM

## 2023-11-14 NOTE — Telephone Encounter (Signed)
 During AWV today patient requested Phentermine  refills. She reports her BP has been great under or at 140/80. She also complained that manual cuff is not used during her visits. Advised we will use a manual cuff as long as she requests it. She wants to remain on phentermine  as it has helped her lose weight. Advised I will send message to her provider.

## 2023-11-14 NOTE — Patient Instructions (Addendum)
    Ms. Hulet ,  Thank you for taking time to complete your Medicare Wellness Visit. I appreciate your ongoing commitment to your health goals. Please review the following plan we discussed and let me know if I can assist you in the future.   These are the goals we discussed:  Goals        Acknowledge receipt of Advanced Directive package (pt-stated)      Interested in information on Advanced Directives. Advised we will mail her a packet.       Exercise 3x per week (30 min per time)      Patient does not participate in regular exercise due to restrictions she has in her knee and hip. She is trying to lose weight. She has lost several pounds while on weight loss medications. To continue with her weight loss success she will remain active as her body allows. Encouraged to add exercise 3 times a week at 30 minutes per event.         This is a list of the screening recommended for you and due dates:  Health Maintenance  Topic Date Due   Pneumococcal Vaccination (1 of 2 - PCV) Never done   Hepatitis B Vaccine (1 of 3 - 19+ 3-dose series) Never done   Zoster (Shingles) Vaccine (1 of 2) Never done   COVID-19 Vaccine (4 - 2024-25 season) 11/30/2023*   Flu Shot  11/18/2023   Mammogram  10/10/2024   Medicare Annual Wellness Visit  11/13/2024   DTaP/Tdap/Td vaccine (2 - Td or Tdap) 03/04/2030   Colon Cancer Screening  05/27/2030   Hepatitis C Screening  Completed   HIV Screening  Completed   HPV Vaccine  Aged Out   Meningitis B Vaccine  Aged Out  *Topic was postponed. The date shown is not the original due date.    You passed your Memory, Hearing, and Depression Screenings with flying colors!  We will see you at your next scheduled visit and we thank you for choosing Primary Care at [facility] for your medical needs.  Have a Happy Healthy [place]!!!!!!!!!!

## 2023-11-15 NOTE — Telephone Encounter (Signed)
 Attempted to reach patient by phone, no answer and VM was full.  Detailed message sent to patient via MyChart.

## 2023-11-15 NOTE — Telephone Encounter (Signed)
 Appeal for Wegovy  0.25 mg was denied.  Sent to provider for review.

## 2023-11-15 NOTE — Telephone Encounter (Signed)
 Please let patient know we have exhausted all options and appeals. Out of pocket is $499/month if she would like to self pay.

## 2023-11-16 NOTE — Addendum Note (Signed)
 Addended by: QUINTIN PIERCE A on: 11/16/2023 10:30 AM   Modules accepted: Orders, Level of Service

## 2023-11-16 NOTE — Progress Notes (Signed)
 Subjective:   Krystal Kelley is a 53 y.o. female who presents for an Initial Medicare Annual Wellness Visit.  Visit Complete: Virtual I connected with  Krystal Kelley on 11/14/23 by a audio enabled telemedicine application and verified that I am speaking with the correct person using two identifiers.  Patient Location: Home  Provider Location: Office/Clinic  I discussed the limitations of evaluation and management by telemedicine. The patient expressed understanding and agreed to proceed.  Vital Signs: Because this visit was a virtual/telehealth visit, some criteria may be missing or patient reported. Any vitals not documented were not able to be obtained and vitals that have been documented are patient reported.  Patient Medicare AWV questionnaire was completed by the patient on 11/14/2023; I have confirmed that all information answered by patient is correct and no changes since this date.  Cardiac Risk Factors include: hypertension;family history of premature cardiovascular disease;diabetes mellitus;obesity (BMI >30kg/m2)     Objective:    Today's Vitals   11/14/23 1405 11/14/23 1438  Weight: 288 lb (130.6 kg)   Height: 5' 4.5 (1.638 m)   PainSc: 0-No pain 0-No pain   Body mass index is 48.67 kg/m.     11/14/2023    3:12 PM 11/14/2023    2:13 PM 10/19/2022   11:53 AM 06/02/2022   12:18 PM 12/25/2021    1:36 AM 07/07/2021   10:06 AM 04/22/2021   11:45 AM  Advanced Directives  Does Patient Have a Medical Advance Directive?  No No No No No No  Would patient like information on creating a medical advance directive? Yes (MAU/Ambulatory/Procedural Areas - Information given) Yes (ED - Information included in AVS) No - Patient declined No - Patient declined  No - Patient declined No - Patient declined    Current Medications (verified) Outpatient Encounter Medications as of 11/14/2023  Medication Sig   cycloSPORINE (RESTASIS) 0.05 % ophthalmic emulsion Place 1 drop into both eyes 2  (two) times daily.   Fluocinolone  Acetonide Body (DERMA-SMOOTHE /FS BODY) 0.01 % OIL Apply to body and extremities after showering once daily on Monday, Wednesday, and Friday.   furosemide  (LASIX ) 20 MG tablet Take 1 tablet (20 mg total) by mouth daily for 3 days.   hydrochlorothiazide  (HYDRODIURIL ) 25 MG tablet Take 1 tablet (25 mg total) by mouth daily.   ketoconazole  (NIZORAL ) 2 % cream Apply to aa's QD.   ketoconazole  (NIZORAL ) 2 % shampoo Shampoo into the skin let sit 5-10 minute then wash out. Use 2-3 days per week.   mometasone  (ELOCON ) 0.1 % cream Apply 1 Application topically daily as needed (Rash).   naltrexone  (DEPADE) 50 MG tablet Take 1 tablet (50 mg total) by mouth daily.   potassium chloride  SA (KLOR-CON  M) 20 MEQ tablet Take 20 mEq by mouth daily.   topiramate  (TOPAMAX ) 25 MG tablet Take 1 tablet (25 mg total) by mouth 2 (two) times daily.   valACYclovir  (VALTREX ) 500 MG tablet Take 1 tablet (500 mg total) by mouth 2 (two) times daily as needed (outbreaks.).   No facility-administered encounter medications on file as of 11/14/2023.    Allergies (verified) Augmentin [amoxicillin-pot clavulanate]   History: Past Medical History:  Diagnosis Date   Anxiety    Breast cancer, right breast (HCC) 2016   Depression    H/O laparoscopy    History of hysteroscopy    HSV infection    Peripheral edema    on lasix    PONV (postoperative nausea and vomiting)    Radiation  10/30/14-12/17/14   right breast 50.4 gray   Past Surgical History:  Procedure Laterality Date   BREAST BIOPSY  05/16/2014   BREAST LUMPECTOMY Right 06/18/2014   bunion surgrery     CARPAL TUNNEL RELEASE Left 09/21/2016   Procedure: LEFT CARPAL TUNNEL RELEASE;  Surgeon: Murrell Kuba, MD;  Location: [facility];  Service: Orthopedics;  Laterality: Left;  REG/FAB   carpel tunnel     CHOLECYSTECTOMY  2015   COLONOSCOPY WITH PROPOFOL  N/A 05/27/2020   Procedure: COLONOSCOPY WITH PROPOFOL ;  Surgeon: Jinny Carmine, MD;  Location: [facility];  Service: Endoscopy;  Laterality: N/A;  PATIENT COVID POSITIVE 04/18/2021   FOOT SURGERY  2024   HYSTERECTOMY ABDOMINAL WITH SALPINGO-OOPHORECTOMY  05/22/2018   Procedure: HYSTERECTOMY ABDOMINAL WITH BILATERAL SALPINGO-OOPHORECTOMY;  Surgeon: Lavoie, Marie-Lyne, MD;  Location: [facility];  Service: Gynecology;;   PLANTAR FASCIECTOMY     PORTACATH PLACEMENT Left 06/18/2014   Procedure: INSERTION PORT-A-CATH;  Surgeon: Morene Olives, MD;  Location: [facility];  Service: General;  Laterality: Left;   ROBOTIC ASSISTED LAPAROSCOPIC LYSIS OF ADHESION  05/22/2018   Procedure: XI ROBOTIC ASSISTED LAPAROSCOPIC LYSIS OF ADHESION;  Surgeon: Lavoie, Marie-Lyne, MD;  Location: [facility];  Service: Gynecology;;   ROBOTIC ASSISTED TOTAL HYSTERECTOMY WITH BILATERAL SALPINGO OOPHERECTOMY Bilateral 05/22/2018   Procedure: ATTEMPTED XI ROBOTIC ASSISTED TOTAL HYSTERECTOMY WITH BILATERAL SALPINGO OOPHORECTOMY;  Surgeon: Lavoie, Marie-Lyne, MD;  Location: [facility] [place];  Service: Gynecology;  Laterality: Bilateral;   Family History  Problem Relation Age of Onset   Diabetes Father    Kidney cancer Father        kidney   Lung cancer Maternal Grandfather        deceased 34; smoker   Colon cancer Paternal Grandfather        deceased 31s   Prostate cancer Maternal Uncle        deceased 63   Diabetes Other    Stroke Other    Hyperlipidemia Other    Hypertension Other    COPD Other    Cancer Other        pat half-sister; ? uterine vs. ovarian ca   Social History   Socioeconomic History   Marital status: Divorced    Spouse name: Not on file   Number of children: 0   Years of education: Not on file   Highest education level: Not on file  Occupational History   Occupation: accounts receivable at [employer]  Tobacco Use   Smoking status: Never    Passive exposure: Never   Smokeless tobacco: Never  Vaping  Use   Vaping status: Never Used  Substance and Sexual Activity   Alcohol use: Yes    Comment: wine- occ    Drug use: No   Sexual activity: Not Currently    Birth control/protection: Surgical, Abstinence    Comment: 1st intercourse- 16, partners more than 5, hysterectomy  Other Topics Concern   Not on file  Social History Narrative   Patient is close to her mpther and follows her care close. Recently divorced with children.    Social Drivers of Corporate investment banker Strain: Not on file  Food Insecurity: No Food Insecurity (04/22/2021)   Hunger Vital Sign    Worried About Running Out of Food in the Last Year: Never true    Ran Out of Food in the Last Year: Never true  Transportation Needs: No Transportation Needs (06/02/2023)   PRAPARE -  Administrator, Civil Service (Medical): No    Lack of Transportation (Non-Medical): No  Physical Activity: Not on file  Stress: Not on file  Social Connections: Not on file    Tobacco Counseling Counseling given: No   Clinical Intake:  Pre-visit preparation completed: Yes  Pain : No/denies pain Pain Score: 0-No pain     Diabetes: No  How often do you need to have someone help you when you read instructions, pamphlets, or other written materials from your doctor or pharmacy?: 1 - Never  Interpreter Needed?: No  Information entered by :: jh   Activities of Daily Living    11/14/2023    2:10 PM  In your present state of health, do you have any difficulty performing the following activities:  Hearing? 0  Vision? 0  Difficulty concentrating or making decisions? 0  Walking or climbing stairs? 1  Dressing or bathing? 0  Doing errands, shopping? 0  Preparing Food and eating ? N  Using the Toilet? N  In the past six months, have you accidently leaked urine? N  Do you have problems with loss of bowel control? N  Managing your Medications? N  Managing your Finances? N  Housekeeping or managing your Housekeeping? N     Patient Care Team: Ziglar, Susan K, MD as PCP - General (Family Medicine) Corlis Honor BROCKS, MD (Internal Medicine) Odean Potts, MD as Consulting Physician (Hematology and Oncology) Shannon Agent, MD as Consulting Physician (Radiation Oncology) Crawford Morna Pickle, NP as Nurse Practitioner (Hematology and Oncology)  Indicate any recent Medical Services you may have received from other than [facility] providers in the past year (date may be approximate).     Assessment:   This is a routine wellness examination for [facility].  Hearing/Vision screen Hearing Screening - Comments:: Whisper test passed-   Goals Addressed               This Visit's Progress      Acknowledge receipt of Advanced Directive package (pt-stated)        Interested in information on Advanced Directives. Advised we will mail her a packet.       Exercise 3x per week (30 min per time)        Patient does not participate in regular exercise due to restrictions she has in her knee and hip. She is trying to lose weight. She has lost several pounds while on weight loss medications. To continue with her weight loss success she will remain active as her body allows. Encouraged to add exercise 3 times a week at 30 minutes per event.        Depression Screen    11/14/2023    2:09 PM 10/06/2023   11:19 AM 07/28/2023   10:23 AM 06/02/2023   11:26 AM 05/05/2023   11:18 AM 10/19/2022   11:53 AM 04/22/2021   11:46 AM  PHQ 2/9 Scores  PHQ - 2 Score 0 0 0 0 0 0 0  PHQ- 9 Score 0 0  1 4      Fall Risk    11/14/2023    2:09 PM 10/06/2023   11:19 AM 07/28/2023   10:22 AM 06/02/2023   11:26 AM 05/05/2023   11:18 AM  Fall Risk   Falls in the past year? 0 0 0 0 0  Number falls in past yr: 0 0 0    Injury with Fall? 0 0 0    Risk for fall due to :  No Fall Risks No Fall Risks No Fall Risks Orthopedic patient  Follow up  Falls evaluation completed Falls evaluation completed Falls prevention discussed Falls prevention  discussed;Education provided;Falls evaluation completed    MEDICARE RISK AT HOME: Medicare Risk at Home Any stairs in or around the home?: Yes If so, are there any without handrails?: Yes Home free of loose throw rugs in walkways, pet beds, electrical cords, etc?: Yes Adequate lighting in your home to reduce risk of falls?: Yes Life alert?: No Use of a cane, walker or w/c?: No Grab bars in the bathroom?: No Shower chair or bench in shower?: No Elevated toilet seat or a handicapped toilet?: No  TIMED UP AND GO:  Was the test performed? no Length of time to ambulate 10 feet: 0 sec Gait steady and fast without use of assistive device    Cognitive Function:    11/14/2023    2:30 PM  MMSE - Mini Mental State Exam  Orientation to time 5  Orientation to Place 5  Registration 3  Attention/ Calculation 5  Recall 3  Language- name 2 objects 2  Language- repeat 1  Language- follow 3 step command 3  Language- read & follow direction 1  Write a sentence 1  Copy design 1  Total score 30        11/14/2023    2:37 PM  6CIT Screen  What Year? 0 points  What month? 0 points  What time? 3 points  Count back from 20 4 points  Months in reverse 4 points  Repeat phrase 10 points  Total Score 21 points    Immunizations Immunization History  Administered Date(s) Administered   Influenza, Seasonal, Injecte, Preservative Fre 05/05/2023   Influenza,inj,Quad PF,6+ Mos 02/05/2015, 03/20/2018, 03/05/2021   Influenza-Unspecified 02/18/2016, 02/06/2020   PFIZER(Purple Top)SARS-COV-2 Vaccination 07/13/2019, 08/03/2019, 04/15/2020   Tdap 03/04/2020    TDAP status: Up to date  Flu Vaccine status: Up to date  Pneumococcal vaccine status: Due, Education has been provided regarding the importance of this vaccine. Advised may receive this vaccine at local pharmacy or Health Dept. Aware to provide a copy of the vaccination record if obtained from local pharmacy or Health Dept. Verbalized  acceptance and understanding.    Qualifies for Shingles Vaccine? Yes   Zostavax completed No   Shingrix Completed?: No.    Education has been provided regarding the importance of this vaccine. Patient has been advised to call insurance company to determine out of pocket expense if they have not yet received this vaccine. Advised may also receive vaccine at local pharmacy or Health Dept. Verbalized acceptance and understanding.  Screening Tests Health Maintenance  Topic Date Due   Pneumococcal Vaccine 33-59 Years old (1 of 2 - PCV) Never done   Hepatitis B Vaccines (1 of 3 - 19+ 3-dose series) Never done   Zoster Vaccines- Shingrix (1 of 2) Never done   COVID-19 Vaccine (4 - 2024-25 season) 11/30/2023 (Originally 12/19/2022)   INFLUENZA VACCINE  11/18/2023   MAMMOGRAM  10/10/2024   Medicare Annual Wellness (AWV)  11/13/2024   DTaP/Tdap/Td (2 - Td or Tdap) 03/04/2030   Colonoscopy  05/27/2030   Hepatitis C Screening  Completed   HIV Screening  Completed   HPV VACCINES  Aged Out   Meningococcal B Vaccine  Aged Out    Health Maintenance  Health Maintenance Due  Topic Date Due   Pneumococcal Vaccine 60-31 Years old (1 of 2 - PCV) Never done   Hepatitis B  Vaccines (1 of 3 - 19+ 3-dose series) Never done   Zoster Vaccines- Shingrix (1 of 2) Never done    Colorectal cancer screening: Type of screening: Colonoscopy. Completed 05/27/2020. Repeat every 10 years  Mammogram status: Completed 10/09/2023. Repeat every year  Bone Density Status: Ordered 08/12/2023 Patient to schedule.   Lung Cancer Screening: (Low Dose CT Chest recommended if Age 53-80 years, 20 pack-year currently smoking OR have quit w/in 15years.) does not qualify.   Lung Cancer Screening Referral: NA  Additional Screening:  Hepatitis C Screening: does not qualify; 12/21/2018 Vision Screening: Recommended annual ophthalmology exams for early detection of glaucoma and other disorders of the eye. Is the patient up to date  with their annual eye exam?  Yes  Who is the provider or what is the name of the office in which the patient attends annual eye exams? [facility] If pt is not established with a provider, would they like to be referred to a provider to establish care? No .   Dental Screening: Recommended annual dental exams for proper oral hygiene  Diabetic Foot Exam: To date Community Resource Referral / Chronic Care Management: CRR required this visit?  No   CCM required this visit?  No     Plan:     I have personally reviewed and noted the following in the patient's chart:   Medical and social history Use of alcohol, tobacco or illicit drugs  Current medications and supplements including opioid prescriptions. Patient is not currently taking opioid prescriptions. Functional ability and status Nutritional status Physical activity Advanced directives List of other physicians Hospitalizations, surgeries, and ER visits in previous 12 months Vitals Screenings to include cognitive, depression, and falls Referrals and appointments  In addition, I have reviewed and discussed with patient certain preventive protocols, quality metrics, and best practice recommendations. A written personalized care plan for preventive services as well as general preventive health recommendations were provided to patient.     Warren DELENA Chiles, CMA   11/16/2023   After Visit Summary: (MyChart) Due to this being a telephonic visit, the after visit summary with patients personalized plan was offered to patient via MyChart   Nurse Notes: Advanced Directive information sent to patient.   Warren DELENA Chiles, CMA

## 2023-11-16 NOTE — Progress Notes (Signed)
 I connected with  Krystal Kelley on 11/14/27 by a telephone visit enabled telemedicine application and verified that I am speaking with the correct person using two identifiers.  Patient Location: Home  Provider Location: Office/Clinic  I discussed the limitations of evaluation and management by telemedicine. The patient expressed understanding and agreed to proceed.

## 2023-11-25 ENCOUNTER — Telehealth: Payer: Self-pay | Admitting: Professional Counselor

## 2023-11-28 ENCOUNTER — Other Ambulatory Visit: Payer: Self-pay | Admitting: Family Medicine

## 2023-11-28 ENCOUNTER — Other Ambulatory Visit: Payer: Self-pay

## 2023-11-28 DIAGNOSIS — I10 Essential (primary) hypertension: Secondary | ICD-10-CM

## 2023-11-28 MED ORDER — HYDROCHLOROTHIAZIDE 25 MG PO TABS: 25.0000 mg | ORAL_TABLET | Freq: Every day | ORAL | 1 refills | Status: DC

## 2023-12-06 ENCOUNTER — Ambulatory Visit (INDEPENDENT_AMBULATORY_CARE_PROVIDER_SITE_OTHER): Admitting: Family Medicine

## 2023-12-06 ENCOUNTER — Encounter: Payer: Self-pay | Admitting: Family Medicine

## 2023-12-06 ENCOUNTER — Other Ambulatory Visit: Payer: Self-pay | Admitting: Family Medicine

## 2023-12-06 VITALS — BP 148/90 | HR 88 | Temp 98.6°F | Resp 18 | Ht 64.5 in | Wt 293.0 lb

## 2023-12-06 DIAGNOSIS — I10 Essential (primary) hypertension: Secondary | ICD-10-CM | POA: Diagnosis not present

## 2023-12-06 MED ORDER — NALTREXONE HCL 50 MG PO TABS: 50.0000 mg | ORAL_TABLET | Freq: Every day | ORAL | 1 refills | Status: DC

## 2023-12-06 MED ORDER — AMLODIPINE BESYLATE 5 MG PO TABS: 5.0000 mg | ORAL_TABLET | Freq: Every day | ORAL | 1 refills | Status: DC

## 2023-12-06 MED ORDER — TOPIRAMATE 50 MG PO TABS: ORAL_TABLET | ORAL | 0 refills | Status: DC

## 2023-12-06 NOTE — Assessment & Plan Note (Signed)
 She is on HCTZ 25 mg daily and her blood pressure is not well-controlled.  I have talked to her about taking an additional medication, amlodipine  5 mg daily, on a number of occasions.  She is going to try taking amlodipine  now.

## 2023-12-06 NOTE — Assessment & Plan Note (Signed)
 She has gained 5 pounds in the last month.  Reviewed her diet and asked her to work with nutrition on this.  Also reviewed her exercise.  Will increase Topamax  to 50 mg twice a day and keep naltrexone  at 50 mg daily.  She cannot have phentermine  because her blood pressure is not controlled.

## 2023-12-06 NOTE — Progress Notes (Signed)
 Established Patient Office Visit  Subjective   Patient ID: Krystal Kelley, female    DOB: 1970/08/01  Age: 53 y.o. MRN: [id number]  Chief Complaint  Patient presents with   Medical Management of Chronic Issues    HPI [name] 53 yo with breast cancer (05/28/2014: Stage Ia: T1c, N0, M0, ER/PR positive, HER2 positive), HTN, obesity, plantar fasciitis bilaterally, OA of the knees, right thumb trigger, Hx ruptured Achilles tendon, s/p cholecystectomy.   Discussed the use of AI scribe software for clinical note transcription with the patient, who gave verbal consent to proceed.  She reports sporadically high blood pressure readings and expresses hesitancy in starting a second antihypertensive medication, specifically amlodipine  5 mg, because she does not believe her blood pressure is consistently high.  She is currently taking Topamax  25 mg twice a day and naltrexone  50mg  daily for weight management, but feels these medications are less effective than phentermine , which she cannot take due to her uncontrolled blood pressure. Her weight has increased from 288 pounds to 293 pounds.  Her diet includes air-fried chicken, oven-cooked meats, occasional pizza, [place] sandwiches without cheese, and light mayonnaise. She is trying to drink more water and has reduced her intake of ice cream and soda due to the carbonation's interaction with Topamax . She sometimes only has one meal a day, typically consisting of air-fried chicken wings, green beans, and rice. She has an appointment with Nutrition on Friday.    She reports a bruise on her arm from a minor injury and experiences occasional headaches, which she attributes to stress but acknowledges could be related to her medication.        ROS    Objective:     BP (!) 148/90   Pulse 88   Temp 98.6 F (37 C) (Oral)   Resp 18   Ht 5' 4.5 (1.638 m)   Wt 293 lb (132.9 kg)   LMP 03/21/2018   SpO2 97%   BMI 49.52 kg/m    Physical  Exam Vitals and nursing note reviewed.  Constitutional:      Appearance: Normal appearance.  HENT:     Head: Normocephalic and atraumatic.  Eyes:     Conjunctiva/sclera: Conjunctivae normal.  Cardiovascular:     Rate and Rhythm: Normal rate and regular rhythm.  Pulmonary:     Effort: Pulmonary effort is normal.     Breath sounds: Normal breath sounds.  Musculoskeletal:     Right lower leg: No edema.     Left lower leg: No edema.  Skin:    General: Skin is warm and dry.  Neurological:     Mental Status: She is alert and oriented to person, place, and time.  Psychiatric:        Mood and Affect: Mood normal.        Behavior: Behavior normal.        Thought Content: Thought content normal.        Judgment: Judgment normal.          No results found for any visits on 12/06/23.    The 10-year ASCVD risk score (Arnett DK, et al., 2019) is: 5%    Assessment & Plan:  Primary hypertension Assessment & Plan: She is on HCTZ 25 mg daily and her blood pressure is not well-controlled.  I have talked to her about taking an additional medication, amlodipine  5 mg daily, on a number of occasions.  She is going to try taking amlodipine  now.  Orders: -  amLODIPine  Besylate; Take 1 tablet (5 mg total) by mouth daily.  Dispense: 90 tablet; Refill: 1  Morbid obesity (HCC) Assessment & Plan: She has gained 5 pounds in the last month.  Reviewed her diet and asked her to work with nutrition on this.  Also reviewed her exercise.  Will increase Topamax  to 50 mg twice a day and keep naltrexone  at 50 mg daily.  She cannot have phentermine  because her blood pressure is not controlled.  Orders: -     Naltrexone  HCl; Take 1 tablet (50 mg total) by mouth daily.  Dispense: 90 tablet; Refill: 1 -     Topiramate ; One half tab p.o. nightly for 1 week then 1 tab p.o. nightly for 1 week then 1 tab p.o. twice daily  Dispense: 60 tablet; Refill: 0     Return in about 4 weeks (around 01/03/2024).     Palmyra Rogacki K Jerrion Tabbert, MD

## 2023-12-09 ENCOUNTER — Other Ambulatory Visit: Payer: Self-pay | Admitting: Family Medicine

## 2023-12-09 ENCOUNTER — Ambulatory Visit: Payer: Self-pay | Admitting: Professional Counselor

## 2023-12-12 ENCOUNTER — Ambulatory Visit (INDEPENDENT_AMBULATORY_CARE_PROVIDER_SITE_OTHER): Admitting: Podiatry

## 2023-12-12 DIAGNOSIS — M722 Plantar fascial fibromatosis: Secondary | ICD-10-CM

## 2023-12-12 NOTE — Progress Notes (Signed)
 She presents today for her 6-week follow-up of her plantar fasciitis last time she was in she had an injection to her left heel states that she is doing much better at this point.  No pain whatsoever.  Objective: Vital signs are stable alert oriented x 3.  There is no erythema edema salines drainage odor no pain posterior right ankle no pain on plantar fascial calcaneal insertion sites bilaterally.  Assessment: Resolving plantar fasciitis at this point.  Plan: Follow-up with her as needed.  She will be scheduled for 6 weeks out she will cancel if is not needed.

## 2023-12-29 NOTE — Patient Instructions (Addendum)
 Late Entry for 12/09/23. Assessment/Review of Medical Management, Hx, and Treatment Plan:   Ht: 5' 4.5"             Wt: 291 lbs.        BMI: 49.2 kg/m2    Labs: None  Meds: Amlodipine , Hydrochlorothiazide , Naltrexone , Topiramate  MVI: None        Dx/Presenting Problem: Morbid Obesity  Living Conditions/Eco-Social Assessment: Adequate stove, refrigerator and water source (city water) Language: English Time: 1:00 pm                          Duration: 1 hour 15 minutes MNT Provider in [employer] Agency: Not WIC eligible Medical Order for MNT Service: In EPIC medical record    Diagnostic Nutrition Assessment: 53 year old female presents today related to a referral from primary care provider.  PCP is concerned about morbid obesity status.  Client is also concerned about weight loss efforts and chronic medical condition/pain that make it difficult to manage.  Client reports that no changes in activity or diet have been made since the referral was made.  She is currently taking medications to assist in weight loss.  Client reports that she can walk for a maximum of 45 minutes before she has joint pain which makes it more difficult.  A 24-hour food recall was completed during the visit today.  Food choices from food recall reveal a diet that is high in calories, fat and low in vegetables and fiber.  Client reports eating 1-2 meals per day, breakfast sometimes but mostly only lunch or dinner.  She usually drinks 2-3 bottles (16.9 oz.) of water and 2 or more cups of Minute Maid juice/punch per day.  She uses 2% milk when she consumes any.  Client reports sipping on water throughout the day and only having a sweetened beverage when she eats a meal.  Education was provided on reading Nutrition Facts/food labels and using MyPlate food guide to highlight the importance of portion control, decreasing fat/sugar and increasing intake of whole fruits/vegetables/grains to promote gradual, healthy weight loss.  24  Hour Recall Breakfast: Krystal Kelley (precooked kind), eggs and toast Snack: Grapes Lunch: None Snack: None Dinner: Krystal Kelley, 2 slices of garlic bread, butter pecan ice cream, Minute Maid juice and water Snack: Small bag of salt and vinegar potato chips   Diagnosis: Presents with excessive fat and energy intake related to poor portion control as evidenced by obesity with BMI > 40 kg/m2.    Intervention/Nutrition Care Plan/Counseling: General recommendations were to (1) increase whole grain/fiber intake to promote satiety, (2) start physical activity, 10 minutes at a time, 30 minutes most days of the week, increasing as tolerated (3) limit portion sizes, (4) read food labels, (5) increase fruit and vegetable intake, (6) transition to 1% or skim milk and (7) limit sweetened beverage intake.  Encouraged drinking mostly water for thirst, eating 3 meals daily with small healthy snacks in between.  After reviewing her 24-hr. food recall we focused on ways to limit fat/calorie intake and increase her fiber intake.  Education was provided for reading and interpreting food labels.  Provided literature: MyPlate and Breaking Down the Food Label.    Monitoring/Evaluation: Will follow-up with a weight check in 2 months (02/08/24 at 11 am).  Encouraged to call with any questions.      Krystal Holmes, MS, RD/LDN 12/09/23

## 2024-01-05 ENCOUNTER — Encounter: Payer: Self-pay | Admitting: Family Medicine

## 2024-01-05 ENCOUNTER — Other Ambulatory Visit: Payer: Self-pay | Admitting: Family Medicine

## 2024-01-05 ENCOUNTER — Ambulatory Visit (INDEPENDENT_AMBULATORY_CARE_PROVIDER_SITE_OTHER): Admitting: Family Medicine

## 2024-01-05 VITALS — BP 139/95 | HR 90 | Resp 16 | Ht 64.5 in | Wt 290.0 lb

## 2024-01-05 DIAGNOSIS — I10 Essential (primary) hypertension: Secondary | ICD-10-CM

## 2024-01-05 DIAGNOSIS — R600 Localized edema: Secondary | ICD-10-CM

## 2024-01-05 MED ORDER — DILTIAZEM HCL ER COATED BEADS 120 MG PO CP24: 120.0000 mg | ORAL_CAPSULE | Freq: Every day | ORAL | 1 refills | Status: DC

## 2024-01-05 NOTE — Progress Notes (Signed)
 Established Patient Office Visit  Subjective   Patient ID: Krystal Kelley    DOB: 18-Jun-1970  Age: 53 y.o. 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   Discussed the use of AI scribe software for clinical note transcription with the patient, who gave verbal consent to proceed.  History of Present Illness   Krystal Kelley is a 53 year old Kelley with hypertension who presents with swelling in the legs and concerns about medication effectiveness.  She has been experiencing swelling in her legs, particularly in the right leg, which has been swollen for about three weeks. She reports that she stood for prolonged periods at a concession stand event, which she believes may have contributed to the swelling. She has been using Lasix , taking 20 mg twice this week, and has noticed some reduction in swelling after soaking in Epsom salt.  She has a history of breast cancer, and her right leg has been prone to swelling since her treatment. Her feet become tender after the swelling subsides.  She is currently on amlodipine  5mg  for hypertension. Her blood pressure readings have been 139/92 and 149/92.  She has been prescribed Topamax  50 mg twice a day and naltrexone  50 mg for weight loss, but she has stopped taking them, feeling they were not effective. She was also taking phentermine , which she felt was effective when combined with the other medications.  She has consulted with a nutritionist who advised her to increase fiber intake and eat five small meals a day to aid in weight loss. She acknowledges not eating enough and often skipping meals, which could be contributing to her weight issues.    Objective:     BP (!) 139/95   Pulse 90   Resp 16   Ht 5' 4.5 (1.638 m)   Wt 290 lb (131.5 kg)   LMP 03/21/2018   SpO2 98%   BMI 49.01 kg/m    Physical Exam Vitals  reviewed.  Constitutional:      Appearance: Normal appearance.  HENT:     Head: Normocephalic.  Eyes:     General:        Right eye: No discharge.        Left eye: No discharge.  Cardiovascular:     Rate and Rhythm: Normal rate.  Pulmonary:     Effort: Pulmonary effort is normal.  Musculoskeletal:     Right lower leg: Edema (to mid calf) present.     Left lower leg: 2+ Pitting Edema (to ankle) present.  Neurological:     Mental Status: She is alert and oriented to person, place, and time.  Psychiatric:        Mood and Affect: Mood normal.        Behavior: Behavior normal.        Thought Content: Thought content normal.        Judgment: Judgment normal.          No results found for any visits on 01/05/24.    The 10-year ASCVD risk score (Arnett DK, et al., 2019) is: 4%    Assessment & Plan:  Primary hypertension -     dilTIAZem  HCl ER Coated Beads; Take 1 capsule (120 mg total) by mouth daily.  Dispense: 30 capsule; Refill: 1  Peripheral edema -     VAS US   LOWER EXTREMITY VENOUS (DVT); Future    Assessment and Plan    Edema of lower extremities, rule out deep vein thrombosis (DVT) of right leg Reports swelling in the lower extremities, particularly in the left leg, which is unusual. Swelling may be related to amlodipine  use. Concern for DVT due to size discrepancy of her right leg.  - Order ultrasound of the left leg to rule out DVT. - Discontinue amlodipine  to assess if it is contributing to the swelling.  Hypertension Blood pressure readings are slightly elevated at 139/92 mmHg and 149/92 mmHg. Currently on amlodipine  5mg  and hydrochlorothiazide  25mg  daily.  Amlodipine  may be contributing to lower extremity swelling. - Discontinue amlodipine . - Initiate Cardizem  CD 120mg  and continue to take HCTZ  Obesity Reports that the combination of Topamax , naltrexone , and phentermine  was effective for weight loss, but current medications, just Topamax  and naltrexone   are not effective. Consulted with a nutritionist who advised increasing fiber intake and eating five small meals a day to prevent starvation mode. Emphasized importance of protein intake, especially in the morning, to prevent excessive hunger later in the day. - Encourage dietary modifications as per nutritionist's advice, including increasing fiber intake and eating five small meals a day. - Encourage protein intake in the morning to prevent excessive hunger later in the day.  Osteoarthritis of ankle and foot Ankle and foot swelling attributed to osteoarthritis. Swelling noted to be both in the joint itself and in the surrounding tissue. Sports-related injuries may contribute to joint changes. - Consider non-pharmacological interventions such as Epsom salt soaks for symptomatic relief.  -Peripheral edema should stop with stopping amlodipine       Return in about 4 weeks (around 02/02/2024) for BP recheck.    Alyson Ki K Mckenzie Bove, MD

## 2024-01-06 ENCOUNTER — Ambulatory Visit (HOSPITAL_COMMUNITY): Admission: RE | Admit: 2024-01-06 | Source: Ambulatory Visit

## 2024-01-17 ENCOUNTER — Telehealth: Payer: Self-pay

## 2024-01-17 NOTE — Telephone Encounter (Signed)
 Copied from CRM [id number]. Topic: Clinical - Prescription Issue >> Jan 16, 2024  4:43 PM Krystal Kelley wrote: Reason for CRM: patient started last Thursday BP, patient states the meds gave her diarrhea patient states her legs and ankles and legs have been swelling and she stopped today and yesterday and the swelling has gone down since she has not taken the medication  Patient have been taking 2 lasix  a day and the swelling had not stopped and also htz meds and the swelling was still happening, nothing was flushing out the swelling   Patient has not had a her ultrasound yet and she will reschedule it   Pt num [phone] (M)

## 2024-01-23 ENCOUNTER — Ambulatory Visit: Admitting: Podiatry

## 2024-01-23 ENCOUNTER — Encounter: Payer: Self-pay | Admitting: Family Medicine

## 2024-01-31 ENCOUNTER — Other Ambulatory Visit: Payer: Self-pay | Admitting: Family Medicine

## 2024-01-31 DIAGNOSIS — I10 Essential (primary) hypertension: Secondary | ICD-10-CM

## 2024-02-01 ENCOUNTER — Ambulatory Visit (INDEPENDENT_AMBULATORY_CARE_PROVIDER_SITE_OTHER): Admitting: Podiatry

## 2024-02-01 DIAGNOSIS — M722 Plantar fascial fibromatosis: Secondary | ICD-10-CM

## 2024-02-01 MED ORDER — TRIAMCINOLONE ACETONIDE 40 MG/ML IJ SUSP: 20.0000 mg | Freq: Once | INTRAMUSCULAR | Status: DC

## 2024-02-02 ENCOUNTER — Encounter: Payer: Self-pay | Admitting: Podiatry

## 2024-02-02 NOTE — Progress Notes (Signed)
 She presents today for follow-up of her plantar fasciitis states that it has been 7 weeks now and left foot is just killing me she refers to this left foot.  He was doing fine for quite a while and now all of a sudden is painful.  Objective: Vital signs stable blood pressure is 146/86, alert oriented x 3 there is no erythema edema salines drainage or odor.  She has pain on palpation medial can tubercle of the left heel.  Assessment: Plantar fasciitis left heel.  Plan: Reinjected left heel today 10 mg of Kenalog  pentagrams Marcaine .  Tolerated procedure well without complications follow-up with her as 1 month or 6 weeks.

## 2024-02-06 ENCOUNTER — Other Ambulatory Visit: Payer: Self-pay | Admitting: Family Medicine

## 2024-02-06 ENCOUNTER — Ambulatory Visit: Admitting: Family Medicine

## 2024-02-06 ENCOUNTER — Encounter: Payer: Self-pay | Admitting: Family Medicine

## 2024-02-06 VITALS — BP 152/92 | HR 67 | Temp 98.7°F | Resp 18 | Ht 64.5 in | Wt 296.0 lb

## 2024-02-06 DIAGNOSIS — R600 Localized edema: Secondary | ICD-10-CM | POA: Diagnosis not present

## 2024-02-06 DIAGNOSIS — I10 Essential (primary) hypertension: Secondary | ICD-10-CM

## 2024-02-06 DIAGNOSIS — Z23 Encounter for immunization: Secondary | ICD-10-CM | POA: Diagnosis not present

## 2024-02-06 MED ORDER — LOSARTAN POTASSIUM-HCTZ 100-25 MG PO TABS: 1.0000 | ORAL_TABLET | Freq: Every day | ORAL | 0 refills | Status: DC

## 2024-02-06 MED ORDER — FUROSEMIDE 20 MG PO TABS: 20.0000 mg | ORAL_TABLET | Freq: Every day | ORAL | 0 refills | Status: AC

## 2024-02-06 NOTE — Progress Notes (Unsigned)
   Established Patient Office Visit  Subjective   Patient ID: Krystal Kelley, female    DOB: 1970-11-26  Age: 53 y.o. MRN: [id number]  Chief Complaint  Patient presents with  . Medical Management of Chronic Issues    HPI Discussed the use of AI scribe software for clinical note transcription with the patient, who gave verbal consent to proceed. [name] 53 yo with breast cancer (05/28/2014: Stage Ia: T1c, N0, M0, ER/PR positive, HER2 positive), HTN, obesity, plantar fasciitis bilaterally, OA of the knees, right thumb trigger, Hx ruptured Achilles tendon, s/p cholecystectomy.   History of Present Illness   Krystal Kelley is a 53 year old female with hypertension who presents with leg swelling and elevated blood pressure.  She stopped taking Cardia Zem CD 120 mg due to persistent leg swelling, despite medication changes, and continues to take HCTZ. 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 She notes that her legs were smaller before a recent day of increased activity.  She has compression socks but does not wear them consistently. Wearing them helps reduce swelling. She reports that after stopping Cardia Zem, her legs started to go down, and her blood pressure readings have been around 145/83 mmHg, which she checks during other medical appointments. She mentions a previous episode where swelling took a month and a half to resolve, causing soreness and tenderness in her feet, particularly the left foot, which was inflamed and painful until she received a cortisone shot last week.  She has not tried any blood pressure medications other than those previously prescribed. She experiences 'white coat syndrome,' noting higher blood pressure readings in the medical office compared to other settings. She currently takes HCTZ 25 mg daily.  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       {History (Optional):23778}  ROS    Objective:     BP (!) 152/92 (BP Location: Left Arm, Patient Position: Sitting, Cuff Size: Large)   Pulse 67   Temp 98.7 F (37.1 C) (Oral)   Resp 18   Ht 5' 4.5 (1.638 m)   Wt 296 lb (134.3 kg)   LMP 03/21/2018   SpO2 99%   BMI 50.02 kg/m  {Vitals History (Optional):23777}  Physical Exam ***  {Perform Simple Foot Exam  Perform Detailed exam:1} {Insert foot Exam (Optional):30965}   No results found for any visits on 02/06/24.  {Labs (Optional):23779}  The 10-year ASCVD risk score (Arnett DK, et al., 2019) is: 5.5%    Assessment & Plan:  Primary hypertension -     Losartan Potassium-HCTZ; Take 1 tablet by mouth daily.  Dispense: 30 tablet; Refill: 0  Peripheral edema -     Furosemide ; Take 1 tablet (20 mg total) by mouth daily for 3 days.  Dispense: 3 tablet; Refill: 0  Immunization due -     Flu vaccine trivalent PF, 6mos and older(Flulaval,Afluria,Fluarix,Fluzone)     Return in about 4 weeks (around 03/05/2024).    Myron Stankovich K Katrena Stehlin, MD

## 2024-02-08 ENCOUNTER — Ambulatory Visit: Payer: Self-pay | Admitting: Professional Counselor

## 2024-02-09 NOTE — Assessment & Plan Note (Signed)
 She had swelling with amlodipine  and Cardizem  despite also taking HCTZ.  Change blood pressure medicine to losartan-HCTZ 100/25.  Follow-up in a month for recheck of her pressure and to check renal function.

## 2024-02-14 ENCOUNTER — Ambulatory Visit: Admitting: Podiatry

## 2024-02-15 ENCOUNTER — Ambulatory Visit (INDEPENDENT_AMBULATORY_CARE_PROVIDER_SITE_OTHER): Admitting: Family Medicine

## 2024-02-15 ENCOUNTER — Encounter: Payer: Self-pay | Admitting: Family Medicine

## 2024-02-15 VITALS — BP 138/74 | HR 89 | Temp 99.0°F | Resp 18 | Ht 64.5 in | Wt 292.0 lb

## 2024-02-15 DIAGNOSIS — R051 Acute cough: Secondary | ICD-10-CM

## 2024-02-15 DIAGNOSIS — I10 Essential (primary) hypertension: Secondary | ICD-10-CM

## 2024-02-15 DIAGNOSIS — J029 Acute pharyngitis, unspecified: Secondary | ICD-10-CM | POA: Insufficient documentation

## 2024-02-15 DIAGNOSIS — J069 Acute upper respiratory infection, unspecified: Secondary | ICD-10-CM | POA: Diagnosis not present

## 2024-02-15 LAB — POCT RAPID STREP A (OFFICE): Rapid Strep A Screen: NEGATIVE

## 2024-02-15 MED ORDER — PREDNISONE 20 MG PO TABS: 20.0000 mg | ORAL_TABLET | Freq: Every day | ORAL | 0 refills | Status: DC

## 2024-02-15 MED ORDER — HYDROCODONE BIT-HOMATROP MBR 5-1.5 MG/5ML PO SOLN: 5.0000 mL | Freq: Three times a day (TID) | ORAL | 0 refills | Status: DC | PRN

## 2024-02-15 MED ORDER — POTASSIUM CHLORIDE CRYS ER 20 MEQ PO TBCR: 20.0000 meq | EXTENDED_RELEASE_TABLET | Freq: Every day | ORAL | 5 refills | Status: AC

## 2024-02-15 NOTE — Assessment & Plan Note (Signed)
 Her rapid strep test is negative.  She does have pharyngitis.  Will give prednisone  20 mg a day for 5 days to help with the pharyngitis.  Please push fluids.

## 2024-02-15 NOTE — Assessment & Plan Note (Signed)
 She has a viral URI and cough.  Will give her Hycodan 5 cc every 8 hours for her cough.  If you develop shortness of breath or wheezing please return to clinic or go to the ED.

## 2024-02-15 NOTE — Progress Notes (Signed)
 Established Patient Office Visit  Subjective   Patient ID: Krystal Kelley, female    DOB: 18-Jul-1970  Age: 53 y.o. MRN: [id number]  Chief Complaint  Patient presents with   Sore Throat    Sore Throat    [name] 53 yo with breast cancer (05/28/2014: Stage Ia: T1c, N0, M0, ER/PR positive, HER2 positive), HTN, obesity, plantar fasciitis bilaterally, OA of the knees, right thumb trigger, Hx ruptured Achilles tendon, s/p cholecystectomy.   She got sick about a week ago with head congestion.  The right side of her head hurts.  She has been coughing up scant mucus that is yellow.  She has not felt herself wheezing but she has felt shortness of breath after coughing.  Her niece has been sick.  She has had temperatures to 99 but no frank fevers or chills.  She has been taking Coricidin over-the-counter with minimal effect.  She has been trying to press fluids.  She denies a rash.  Her throat is very sore and she has got white on her tonsils so she is concerned that she has strep throat.   Objective:     BP 138/74 (BP Location: Left Arm, Patient Position: Sitting, Cuff Size: Large)   Pulse 89   Temp 99 F (37.2 C) (Oral)   Resp 18   Ht 5' 4.5 (1.638 m)   Wt 292 lb (132.5 kg)   LMP 03/21/2018   SpO2 98%   BMI 49.35 kg/m    Physical Exam Vitals and nursing note reviewed.  Constitutional:      Appearance: Normal appearance.  HENT:     Head: Normocephalic and atraumatic.     Right Ear: Tympanic membrane, ear canal and external ear normal. There is no impacted cerumen.     Left Ear: Tympanic membrane, ear canal and external ear normal. There is no impacted cerumen.     Nose: Congestion present.     Right Turbinates: Swollen.     Left Turbinates: Swollen.     Mouth/Throat:     Pharynx: Oropharynx is clear. Posterior oropharyngeal erythema present.  Eyes:     Conjunctiva/sclera: Conjunctivae normal.  Cardiovascular:     Rate and Rhythm: Normal rate and regular rhythm.   Pulmonary:     Effort: Pulmonary effort is normal.     Breath sounds: Normal breath sounds.  Musculoskeletal:     Cervical back: Neck supple. No tenderness.  Lymphadenopathy:     Cervical: No cervical adenopathy.  Skin:    General: Skin is warm and dry.  Neurological:     Mental Status: She is alert and oriented to person, place, and time.  Psychiatric:        Mood and Affect: Mood normal.        Behavior: Behavior normal.        Thought Content: Thought content normal.        Judgment: Judgment normal.          Results for orders placed or performed in visit on 02/15/24  POCT rapid strep A  Result Value Ref Range   Rapid Strep A Screen Negative Negative      The 10-year ASCVD risk score (Arnett DK, et al., 2019) is: 3.9%    Assessment & Plan:  Sore throat Assessment & Plan: Her rapid strep test is negative.  She does have pharyngitis.  Will give prednisone  20 mg a day for 5 days to help with the pharyngitis.  Please push fluids.  Orders: -  POCT rapid strep A -     predniSONE ; Take 1 tablet (20 mg total) by mouth daily with breakfast.  Dispense: 5 tablet; Refill: 0  Acute cough -     HYDROcodone  Bit-Homatrop MBr; Take 5 mLs by mouth every 8 (eight) hours as needed for cough.  Dispense: 120 mL; Refill: 0  Primary hypertension -     Potassium Chloride  Crys ER; Take 1 tablet (20 mEq total) by mouth daily.  Dispense: 30 tablet; Refill: 5  Viral URI with cough Assessment & Plan: She has a viral URI and cough.  Will give her Hycodan 5 cc every 8 hours for her cough.  If you develop shortness of breath or wheezing please return to clinic or go to the ED.      No follow-ups on file.    Lanie Schelling K Devonn Giampietro, MD

## 2024-02-17 ENCOUNTER — Telehealth: Payer: Self-pay

## 2024-02-17 NOTE — Telephone Encounter (Signed)
 Copied from CRM [id number]. Topic: Clinical - Medication Question >> Feb 17, 2024  4:18 PM Dedra B wrote: Reason for CRM: Pt said that she doesn't feel like the hycodan isn't helping her cough and would like something stronger called in. She has used Tussionex in the past. Pls send to [facility] on [address].

## 2024-02-20 ENCOUNTER — Ambulatory Visit: Payer: Self-pay

## 2024-02-20 ENCOUNTER — Ambulatory Visit (INDEPENDENT_AMBULATORY_CARE_PROVIDER_SITE_OTHER): Admitting: Family Medicine

## 2024-02-20 ENCOUNTER — Encounter: Payer: Self-pay | Admitting: Family Medicine

## 2024-02-20 VITALS — BP 124/90 | HR 96 | Temp 98.4°F | Wt 293.0 lb

## 2024-02-20 DIAGNOSIS — J069 Acute upper respiratory infection, unspecified: Secondary | ICD-10-CM | POA: Diagnosis not present

## 2024-02-20 MED ORDER — HYDROCOD POLI-CHLORPHE POLI ER 10-8 MG/5ML PO SUER: 5.0000 mL | Freq: Two times a day (BID) | ORAL | 0 refills | Status: DC | PRN

## 2024-02-20 MED ORDER — SULFAMETHOXAZOLE-TRIMETHOPRIM 400-80 MG PO TABS: 1.0000 | ORAL_TABLET | Freq: Two times a day (BID) | ORAL | 0 refills | Status: AC

## 2024-02-20 NOTE — Telephone Encounter (Signed)
 FYI Only or Action Required?: FYI only for provider: appointment scheduled on t.  Patient was last seen in primary care on 02/15/2024 by Ziglar, Susan K, MD.  Called Nurse Triage reporting Sore Throat.  Symptoms began worsening since last LOV  Interventions attempted: Prescription medications:  SABRA  Symptoms are: gradually worsening.  Triage Disposition: See Physician Within 24 Hours  Patient/caregiver understands and will follow disposition?: Yes    Copied from CRM #[id number]. Topic: Clinical - Red Word Triage >> Feb 20, 2024 10:31 AM Joesph NOVAK wrote: Red Word that prompted transfer to Nurse Triage: Sore throat, coughing. Reason for Disposition  SEVERE throat pain (e.g., excruciating)  Answer Assessment - Initial Assessment Questions 1. ONSET: When did the throat start hurting? (Hours or days ago)      Worsening since 02/15/2024 2. SEVERITY: How bad is the sore throat? (Scale 1-10; mild, moderate or severe)     8/10 3. STREP EXPOSURE: Has there been any exposure to strep within the past week? If Yes, ask: What type of contact occurred?      na 4.  VIRAL SYMPTOMS: Are there any symptoms of a cold, such as a runny nose, cough, hoarse voice or red eyes?      Red, inflamed, cough, hoarseness,  5. FEVER: Do you have a fever? If Yes, ask: What is your temperature, how was it measured, and when did it start?     no 6. PUS ON THE TONSILS: Is there pus on the tonsils in the back of your throat?     yes 7. OTHER SYMPTOMS: Do you have any other symptoms? (e.g., difficulty breathing, headache, rash)     Whitish in throat,  mucous, - yellowish/clear 8. PREGNANCY: Is there any chance you are pregnant? When was your last menstrual period?     Na  Took COVID test x 2 days ago and was negative.  Took flu shot x 2 weeks ago  Protocols used: Sore Throat-A-AH

## 2024-02-20 NOTE — Progress Notes (Signed)
 Acute Care Office Visit  Subjective:   Krystal Kelley Feb 05, 1971 02/20/2024  Chief Complaint  Patient presents with   Follow-up    Cough/sore throat.... Pt completed steroids... sore throat.... taking Coricidin otc, Zyrtec...  Productive cough at times yellow to clear sputum as well as nasal mucus    HPI:  Discussed the use of AI scribe software for clinical note transcription with the patient, who gave verbal consent to proceed.  History of Present Illness   Krystal Kelley is a 53 year old female who presents with worsening cough and sore throat.  She initially experienced a cough for which she was prescribed cough medication on 10/29, but her symptoms have since worsened. The cough has become more productive and is now accompanied by a sore throat. She denies fever or chills and had a negative COVID test two days ago at home.  She reports mild nasal congestion and ear popping without pain or tinnitus. She experiences pain on the right side of her head. She reports that when she first came in, her tonsils were turning white, especially on the right side, and her throat was red and swollen. She experiences drainage and mucus in the back of her throat, causing choking at times, notably on Saturday night.  The medication Hycodan was ineffective, and she has been unable to sleep due to persistent coughing. Previous medications like Tussinex were more effective. Currently, there is pain on both sides of her throat, worsening with swallowing. Throat pain intensified two days ago, significantly hurting when eating solid foods. She describes the pain as a 'hurt, hurt' rather than scratchy.  She has a history of producing tonsil stones. She is concerned about the persistent sore throat despite initial improvement with prednisone .      Review of Systems  Constitutional:  Negative for chills and fever.  HENT:  Positive for congestion and sore throat. Negative for ear pain (popping  sensation).   Respiratory:  Positive for cough and sputum production. Negative for shortness of breath and wheezing.   Cardiovascular:  Negative for chest pain.  Gastrointestinal:  Negative for abdominal pain, nausea and vomiting.  Neurological:  Positive for headaches.  Psychiatric/Behavioral:  Negative for suicidal ideas.      The following portions of the patient's history were reviewed and updated as appropriate: past medical history, past surgical history, family history, social history, allergies, medications, and problem list.   Patient Active Problem List   Diagnosis Date Noted   Sore throat 02/15/2024   Viral URI with cough 02/15/2024   Obesity, Class III, BMI 40-49.9 (morbid obesity) (HCC) 09/12/2023   Eustachian tube dysfunction, left 09/12/2023   Noncompliance with medication regimen 09/12/2023   Well woman exam with routine gynecological exam 07/28/2023   Trigger finger of right thumb 05/17/2023   Morbid obesity (HCC) 05/05/2023   Hypertension 05/05/2023   Encounter for screening colonoscopy 03/18/2020   Posttraumatic stress disorder 12/09/2016   Breast cancer of lower-inner quadrant of right female breast (HCC) 05/29/2014   Past Medical History:  Diagnosis Date   Anxiety    Breast cancer, right breast (HCC) 2016   Depression    H/O laparoscopy    History of hysteroscopy    HSV infection    Peripheral edema    on lasix    PONV (postoperative nausea and vomiting)    Radiation 10/30/14-12/17/14   right breast 50.4 gray   Past Surgical History:  Procedure Laterality Date   BREAST BIOPSY  05/16/2014   BREAST LUMPECTOMY Right 06/18/2014   bunion surgrery     CARPAL TUNNEL RELEASE Left 09/21/2016   Procedure: LEFT CARPAL TUNNEL RELEASE;  Surgeon: Murrell Kuba, MD;  Location: [facility];  Service: Orthopedics;  Laterality: Left;  REG/FAB   carpel tunnel     CHOLECYSTECTOMY  2015   COLONOSCOPY WITH PROPOFOL  N/A 05/27/2020   Procedure: COLONOSCOPY WITH  PROPOFOL ;  Surgeon: Jinny Carmine, MD;  Location: [facility];  Service: Endoscopy;  Laterality: N/A;  PATIENT COVID POSITIVE 04/18/2021   FOOT SURGERY  2024   HYSTERECTOMY ABDOMINAL WITH SALPINGO-OOPHORECTOMY  05/22/2018   Procedure: HYSTERECTOMY ABDOMINAL WITH BILATERAL SALPINGO-OOPHORECTOMY;  Surgeon: Lavoie, Marie-Lyne, MD;  Location: [facility];  Service: Gynecology;;   PLANTAR FASCIECTOMY     PORTACATH PLACEMENT Left 06/18/2014   Procedure: INSERTION PORT-A-CATH;  Surgeon: Morene Olives, MD;  Location: [facility];  Service: General;  Laterality: Left;   ROBOTIC ASSISTED LAPAROSCOPIC LYSIS OF ADHESION  05/22/2018   Procedure: XI ROBOTIC ASSISTED LAPAROSCOPIC LYSIS OF ADHESION;  Surgeon: Lavoie, Marie-Lyne, MD;  Location: [facility];  Service: Gynecology;;   ROBOTIC ASSISTED TOTAL HYSTERECTOMY WITH BILATERAL SALPINGO OOPHERECTOMY Bilateral 05/22/2018   Procedure: ATTEMPTED XI ROBOTIC ASSISTED TOTAL HYSTERECTOMY WITH BILATERAL SALPINGO OOPHORECTOMY;  Surgeon: Lavoie, Marie-Lyne, MD;  Location: [facility];  Service: Gynecology;  Laterality: Bilateral;   Family History  Problem Relation Age of Onset   Diabetes Father    Kidney cancer Father        kidney   Lung cancer Maternal Grandfather        deceased 47; smoker   Colon cancer Paternal Grandfather        deceased 43s   Prostate cancer Maternal Uncle        deceased 21   Diabetes Other    Stroke Other    Hyperlipidemia Other    Hypertension Other    COPD Other    Cancer Other        pat half-sister; ? uterine vs. ovarian ca   Outpatient Medications Prior to Visit  Medication Sig Dispense Refill   cycloSPORINE (RESTASIS) 0.05 % ophthalmic emulsion Place 1 drop into both eyes 2 (two) times daily.     Fluocinolone  Acetonide Body (DERMA-SMOOTHE /FS BODY) 0.01 % OIL Apply to body and extremities after showering once daily on Monday, Wednesday, and Friday. 118.28 mL 5    furosemide  (LASIX ) 20 MG tablet Take 1 tablet (20 mg total) by mouth daily for 3 days. 3 tablet 0   ketoconazole  (NIZORAL ) 2 % cream Apply to aa's QD. 60 g 5   ketoconazole  (NIZORAL ) 2 % shampoo Shampoo into the skin let sit 5-10 minute then wash out. Use 2-3 days per week. 120 mL 5   losartan-hydrochlorothiazide  (HYZAAR) 100-25 MG tablet Take 1 tablet by mouth daily. 30 tablet 0   mometasone  (ELOCON ) 0.1 % cream Apply 1 Application topically daily as needed (Rash). 45 g 5   potassium chloride  SA (KLOR-CON  M) 20 MEQ tablet Take 1 tablet (20 mEq total) by mouth daily. 30 tablet 5   predniSONE  (DELTASONE ) 20 MG tablet Take 1 tablet (20 mg total) by mouth daily with breakfast. 5 tablet 0   valACYclovir  (VALTREX ) 500 MG tablet Take 1 tablet (500 mg total) by mouth 2 (two) times daily as needed (outbreaks.). 60 tablet 3   HYDROcodone  bit-homatropine (HYCODAN) 5-1.5 MG/5ML syrup Take 5 mLs by mouth every 8 (eight) hours as needed for cough. 120  mL 0   Facility-Administered Medications Prior to Visit  Medication Dose Route Frequency Provider Last Rate Last Admin   triamcinolone  acetonide (KENALOG -40) injection 20 mg  20 mg Other Once        Allergies  Allergen Reactions   Augmentin [Amoxicillin-Pot Clavulanate] Itching   ROS: A complete ROS was performed with pertinent positives/negatives noted in the HPI. The remainder of the ROS are negative.    Objective:   Today's Vitals   02/20/24 1504  BP: (!) 124/90  Pulse: 96  Temp: 98.4 F (36.9 C)  TempSrc: Oral  Weight: 293 lb (132.9 kg)    Physical Exam Vitals reviewed.  Constitutional:      Appearance: Normal appearance.  HENT:     Right Ear: Ear canal and external ear normal. A middle ear effusion is present.     Left Ear: Ear canal and external ear normal. A middle ear effusion is present.     Nose:     Right Turbinates: Swollen.     Left Turbinates: Swollen.     Right Sinus: Frontal sinus tenderness present. No maxillary sinus  tenderness.     Left Sinus: Frontal sinus tenderness present. No maxillary sinus tenderness.     Mouth/Throat:     Pharynx: Posterior oropharyngeal erythema and postnasal drip present.     Tonsils: No tonsillar exudate or tonsillar abscesses. 2+ on the right. 2+ on the left.  Cardiovascular:     Rate and Rhythm: Normal rate and regular rhythm.     Pulses: Normal pulses.     Heart sounds: Normal heart sounds.  Pulmonary:     Effort: Pulmonary effort is normal.     Breath sounds: Normal breath sounds.  Lymphadenopathy:     Cervical: No cervical adenopathy.  Neurological:     Mental Status: She is alert.  Psychiatric:        Mood and Affect: Mood normal.        Behavior: Behavior normal.      Assessment & Plan:   1. Acute URI (Primary) Patient presents today with ongoing productive cough, sore throat, nasal congestion, and headache. She was seen in office on 02/15/2024 for sore throat and was given Hycodan and prednisone . Denies shortness of breath, chest pain, vision changes, and wheezing. She reports the Hycodan did not help her cough. Physical exam with tenderness of her frontal sinuses and fullness present in her ears. No adventitious lung sounds present in all lung fields. Initially most likely viral, now likely bacterial due to persistent symptoms and erythematous oropharynx. Prescribed antibiotic and cough medication. Advised patient to return to office for worsening symptoms.  - chlorpheniramine-HYDROcodone  (TUSSIONEX) 10-8 MG/5ML; Take 5 mLs by mouth every 12 (twelve) hours as needed for cough.  Dispense: 70 mL; Refill: 0 - sulfamethoxazole -trimethoprim  (BACTRIM ) 400-80 MG tablet; Take 1 tablet by mouth 2 (two) times daily for 5 days.  Dispense: 10 tablet; Refill: 0   Return if symptoms worsen or fail to improve.    Patient to reach out to office if new, worrisome, or unresolved symptoms arise or if no improvement in patient's condition. Patient verbalized understanding and is  agreeable to treatment plan. All questions answered to patient's satisfaction.    Evalene Arts, FNP

## 2024-03-05 ENCOUNTER — Ambulatory Visit: Payer: Self-pay | Admitting: Family Medicine

## 2024-03-05 DIAGNOSIS — J069 Acute upper respiratory infection, unspecified: Secondary | ICD-10-CM

## 2024-03-05 NOTE — Telephone Encounter (Signed)
 FYI Only or Action Required?: Action required by provider: requesting Tussinex.  Patient was last seen in primary care on 02/20/2024 by Towana Small, FNP.  Called Nurse Triage reporting Cough.  Symptoms began several weeks ago.  Interventions attempted: OTC medications: zyrtec, corcitin, Prescription medications: flonase , hycodan, and Rest, hydration, or home remedies.  Symptoms are: unchanged.  Triage Disposition: See Physician Within 24 Hours  Patient/caregiver understands and will follow disposition?: Yes         Copied from CRM #[id number]. Topic: Clinical - Red Word Triage >> Mar 05, 2024  3:57 PM Shanda MATSU wrote: Red Word that prompted transfer to Nurse Triage: Patient is reporting that coughing has started up again, patient is reporting green, yellow phlegm. Reason for Disposition  [1] Continuous (nonstop) coughing interferes with work or school AND [2] no improvement using cough treatment per Care Advice    Pt has appt with PCP tomorrow and inquiring if she can get Tussonex refill in the meantime. Pt reports that Hycodan does not work, and Tussonex does provide minimal relief  Answer Assessment - Initial Assessment Questions 1. ONSET: When did the cough begin?      Ongoing for a few weeks Recent AV and given prednisone  (completed) and started to feel better, but then had to come back for another AV. Now starting to feel bad again 2. SEVERITY: How bad is the cough today?      *No Answer* 3. SPUTUM: Describe the color of your sputum (e.g., none, dry cough; clear, white, yellow, green)     Green/yellow 4. HEMOPTYSIS: Are you coughing up any blood? If Yes, ask: How much? (e.g., flecks, streaks, tablespoons, etc.)     N/a 5. DIFFICULTY BREATHING: Are you having difficulty breathing? If Yes, ask: How bad is it? (e.g., mild, moderate, severe)      Nasal congestion 6. FEVER: Do you have a fever? If Yes, ask: What is your temperature, how was it measured, and  when did it start?     denies 7. CARDIAC HISTORY: Do you have any history of heart disease? (e.g., heart attack, congestive heart failure)      *No Answer* 8. LUNG HISTORY: Do you have any history of lung disease?  (e.g., pulmonary embolus, asthma, emphysema)     *No Answer* 9. PE RISK FACTORS: Do you have a history of blood clots? (or: recent major surgery, recent prolonged travel, bedridden)     *No Answer* 10. OTHER SYMPTOMS: Do you have any other symptoms? (e.g., runny nose, wheezing, chest pain)       Ear congestion 11. PREGNANCY: Is there any chance you are pregnant? When was your last menstrual period?       N/a 12. TRAVEL: Have you traveled out of the country in the last month? (e.g., travel history, exposures)       N/a  Protocols used: Cough - Acute Productive-A-AH

## 2024-03-06 ENCOUNTER — Ambulatory Visit: Admitting: Family Medicine

## 2024-03-06 ENCOUNTER — Other Ambulatory Visit: Payer: Self-pay | Admitting: Family Medicine

## 2024-03-06 ENCOUNTER — Encounter: Payer: Self-pay | Admitting: Family Medicine

## 2024-03-06 VITALS — BP 150/72 | HR 97 | Temp 98.0°F | Resp 20 | Ht 64.5 in | Wt 298.0 lb

## 2024-03-06 DIAGNOSIS — I10 Essential (primary) hypertension: Secondary | ICD-10-CM

## 2024-03-06 DIAGNOSIS — J069 Acute upper respiratory infection, unspecified: Secondary | ICD-10-CM | POA: Diagnosis not present

## 2024-03-06 MED ORDER — SULFAMETHOXAZOLE-TRIMETHOPRIM 800-160 MG PO TABS: 1.0000 | ORAL_TABLET | Freq: Two times a day (BID) | ORAL | 0 refills | Status: DC

## 2024-03-06 MED ORDER — HYDROCOD POLI-CHLORPHE POLI ER 10-8 MG/5ML PO SUER: 5.0000 mL | Freq: Two times a day (BID) | ORAL | 0 refills | Status: DC | PRN

## 2024-03-06 NOTE — Assessment & Plan Note (Signed)
 She has been taking losartan 100-HCTZ 25.  Blood pressure is mildly elevated at 150/72 but she is sick.  She took the Lasix  the other day and she felt that it caused her feet to swell.  Advised that Lasix  is the treatment for swelling it typically does not cause swelling.

## 2024-03-06 NOTE — Assessment & Plan Note (Signed)
 Recurrent symptoms of cough, yellow mucus and nasal congestion with ear fullness.  Initial improvement with Bactrim  and Tussionex but symptoms returned after cessation of antibiotics.  No postnasal drip observed on exam.  Symptoms suggestive of a sinus infection.  Bactrim  double strength for 7 days and refilled Tussionex 5 cc every 12 hours.

## 2024-03-06 NOTE — Progress Notes (Signed)
 Established Patient Office Visit  Subjective   Patient ID: Krystal Kelley, female    DOB: 01-02-1971  Age: 53 y.o. MRN: [id number]  Chief Complaint  Patient presents with   Medical Management of Chronic Issues    HPI [name] 53 yo with breast cancer (05/28/2014: Stage Ia: T1c, N0, M0, ER/PR positive, HER2 positive), HTN, obesity, plantar fasciitis bilaterally, OA of the knees, right thumb trigger, Hx ruptured Achilles tendon, s/p cholecystectomy.   Discussed the use of AI scribe software for clinical note transcription with the patient, who gave verbal consent to proceed.  History of Present Illness   Krystal Kelley is a 53 year old female who presents with persistent cough and congestion.  She initially experienced improvement in her symptoms after being prescribed Bactrim  and Tosamax on November 3rd, but her symptoms have since returned. She describes a persistent cough that initially improved but then worsened again, with yellow mucus production and congestion. Her ears feel clogged, and she experiences sinus pressure and nasal congestion. Her throat is not sore, but she is coughing frequently, both day and night, which is unusual for her as she typically only coughs at night when lying down.  She has been taking over-the-counter Coricetin, Zyrtec, fluticasone , and using saline nasal spray. She ran out of cough medicine, Tussinex, which was prescribed in a small quantity. Her voice has not returned to normal and her ears remain clogged despite attempts to clear them by blowing her nose.  She is frustrated with her inability to exercise due to the cough, as it worsens with physical activity and she is concerned about coughing around others. She has been drinking water and tea to help manage her symptoms.  She also mentions a separate issue with her ankles swelling after taking Lasix , although her ankles had been normal-sized while on her previous medication regimen, which included  HCTZ.           Objective:     BP (!) 150/72 (BP Location: Right Arm, Patient Position: Sitting, Cuff Size: Large)   Pulse 97   Temp 98 F (36.7 C) (Oral)   Resp 20   Ht 5' 4.5 (1.638 m)   Wt 298 lb (135.2 kg)   LMP 03/21/2018   SpO2 98%   BMI 50.36 kg/m    Physical Exam Vitals and nursing note reviewed.  Constitutional:      Appearance: Normal appearance.  HENT:     Head: Normocephalic and atraumatic.  Eyes:     Conjunctiva/sclera: Conjunctivae normal.  Cardiovascular:     Rate and Rhythm: Normal rate and regular rhythm.  Pulmonary:     Effort: Pulmonary effort is normal.     Breath sounds: Normal breath sounds.  Musculoskeletal:     Right lower leg: No edema.     Left lower leg: No edema.  Skin:    General: Skin is warm and dry.  Neurological:     Mental Status: She is alert and oriented to person, place, and time.  Psychiatric:        Mood and Affect: Mood normal.        Behavior: Behavior normal.        Thought Content: Thought content normal.        Judgment: Judgment normal.          No results found for any visits on 03/06/24.    The 10-year ASCVD risk score (Arnett DK, et al., 2019) is: 5.3%    Assessment & Plan:  Primary hypertension Assessment & Plan: She has been taking losartan 100-HCTZ 25.  Blood pressure is mildly elevated at 150/72 but she is sick.  She took the Lasix  the other day and she felt that it caused her feet to swell.  Advised that Lasix  is the treatment for swelling it typically does not cause swelling.   Acute URI Assessment & Plan: Recurrent symptoms of cough, yellow mucus and nasal congestion with ear fullness.  Initial improvement with Bactrim  and Tussionex but symptoms returned after cessation of antibiotics.  No postnasal drip observed on exam.  Symptoms suggestive of a sinus infection.  Bactrim  double strength for 7 days and refilled Tussionex 5 cc every 12 hours.  Orders: -     Hydrocod Poli-Chlorphe Poli ER;  Take 5 mLs by mouth every 12 (twelve) hours as needed for cough.  Dispense: 110 mL; Refill: 0  Other orders -     Sulfamethoxazole -Trimethoprim ; Take 1 tablet by mouth 2 (two) times daily.  Dispense: 14 tablet; Refill: 0          Return in about 4 weeks (around 04/03/2024).    Wynona Duhamel K Amayra Kiedrowski, MD

## 2024-03-07 ENCOUNTER — Ambulatory Visit (INDEPENDENT_AMBULATORY_CARE_PROVIDER_SITE_OTHER): Admitting: Podiatry

## 2024-03-07 DIAGNOSIS — M25872 Other specified joint disorders, left ankle and foot: Secondary | ICD-10-CM

## 2024-03-07 MED ORDER — TRIAMCINOLONE ACETONIDE 40 MG/ML IJ SUSP
20.0000 mg | Freq: Once | INTRAMUSCULAR | Status: AC
  Administered 2024-03-07: 20 mg

## 2024-03-07 NOTE — Progress Notes (Signed)
 She presents today with about a month of pain to the plantar medial aspect of the first metatarsal phalangeal joint area of her left foot.  States that when she bends her toe up it really hurts right there here she points to an area just proximal to the tibial sesamoid left foot.  Objective: Vital signs are stable alert and oriented x 3.  She has tenderness on palpation of the tibial sesamoid and the flexor brevis tendon slip of the hallux to that left foot.  She also has tenderness on plantarflexion of the proximal phalanx against resistance in the same area.  Assessment: New diagnosis.  Most likely flexor brevis tendiniti or sesamoiditis of the first metatarsal phalangeal joint.  Plan: I injected the area today with dexamethasone  and local anesthetic.  She tolerated procedure well without complications follow-up with her in the near future.

## 2024-03-08 ENCOUNTER — Other Ambulatory Visit: Payer: Self-pay | Admitting: Family Medicine

## 2024-03-08 DIAGNOSIS — I10 Essential (primary) hypertension: Secondary | ICD-10-CM

## 2024-03-10 ENCOUNTER — Encounter: Payer: Self-pay | Admitting: Family Medicine

## 2024-03-10 DIAGNOSIS — I10 Essential (primary) hypertension: Secondary | ICD-10-CM

## 2024-03-13 MED ORDER — LOSARTAN POTASSIUM-HCTZ 100-25 MG PO TABS: 1.0000 | ORAL_TABLET | Freq: Every day | ORAL | 0 refills | Status: DC

## 2024-03-13 NOTE — Addendum Note (Signed)
 Addended by: RAYANN REXENE HERO on: 03/13/2024 08:39 AM   Modules accepted: Orders

## 2024-03-14 ENCOUNTER — Other Ambulatory Visit: Payer: Self-pay | Admitting: Family Medicine

## 2024-03-22 ENCOUNTER — Other Ambulatory Visit: Payer: Self-pay | Admitting: Family Medicine

## 2024-03-22 DIAGNOSIS — R0982 Postnasal drip: Secondary | ICD-10-CM

## 2024-03-22 MED ORDER — FLUTICASONE PROPIONATE 50 MCG/ACT NA SUSP: 2.0000 | Freq: Every day | NASAL | 6 refills | Status: DC

## 2024-03-23 ENCOUNTER — Encounter: Payer: Self-pay | Admitting: Family Medicine

## 2024-03-23 ENCOUNTER — Ambulatory Visit: Admitting: Family Medicine

## 2024-03-23 VITALS — BP 127/89 | HR 99 | Temp 98.6°F | Resp 16 | Ht 64.5 in | Wt 302.4 lb

## 2024-03-23 DIAGNOSIS — J069 Acute upper respiratory infection, unspecified: Secondary | ICD-10-CM | POA: Diagnosis not present

## 2024-03-23 DIAGNOSIS — R051 Acute cough: Secondary | ICD-10-CM | POA: Diagnosis not present

## 2024-03-23 MED ORDER — PROMETHAZINE-DM 6.25-15 MG/5ML PO SYRP: 5.0000 mL | ORAL_SOLUTION | Freq: Four times a day (QID) | ORAL | 0 refills | Status: DC | PRN

## 2024-03-23 MED ORDER — GUAIFENESIN ER 600 MG PO TB12: 1200.0000 mg | ORAL_TABLET | Freq: Every day | ORAL | 0 refills | Status: DC

## 2024-03-23 MED ORDER — AZITHROMYCIN 250 MG PO TABS: ORAL_TABLET | ORAL | 0 refills | Status: AC

## 2024-03-23 NOTE — Progress Notes (Unsigned)
 Acute Care Office Visit  Subjective:   Krystal Kelley February 17, 1971 03/23/2024  Chief Complaint  Patient presents with   Cough    Productive cough still. She did antibiotic two rounds and steroid and still coughing. Denies fever and wheezing.   HPI: Discussed the use of AI scribe software for clinical note transcription with the patient, who gave verbal consent to proceed.  History of Present Illness   Krystal Kelley is a 53 year old female who presents with a persistent cough since end of October.  She has been experiencing a persistent cough since end of October/early November, described as productive with yellow and occasionally green sputum. The cough originates from her chest and feels like it is 'breaking out' from there. She has been treated with prednisone  for a week and Bactrim  twice, but these treatments have not been effective. She has previously responded well to a Z-Pak for similar symptoms.   The cough is sometimes accompanied by a headache, particularly when coughing. It has significantly impacted her sleep, causing her to stay awake at night and feel tired during the day, affecting her ability to participate in activities such as line dancing. She uses Mucinex , specifically the regular 600 mg formulation, and takes 7mLs of cough medicine every night to help with sleep. She also uses lemon ginger tea and consumes a significant amount of cough drops to soothe her irritated throat.  During the review of symptoms, she reports tenderness in her sinuses, particularly on one side, and recalls past symptoms with previous sinus infections.   She is part of a line dancing team, which she has been unable to participate in due to her persistent cough. She also mentions responsibilities such as taking her children to school, which have been affected by her lack of sleep.      The following portions of the patient's history were reviewed and updated as appropriate: past medical  history, past surgical history, family history, social history, allergies, medications, and problem list.   Patient Active Problem List   Diagnosis Date Noted   Acute URI 02/15/2024   Obesity, Class III, BMI 40-49.9 (morbid obesity) (HCC) 09/12/2023   Eustachian tube dysfunction, left 09/12/2023   Noncompliance with medication regimen 09/12/2023   Well woman exam with routine gynecological exam 07/28/2023   Trigger finger of right thumb 05/17/2023   Morbid obesity (HCC) 05/05/2023   Hypertension 05/05/2023   Encounter for screening colonoscopy 03/18/2020   Posttraumatic stress disorder 12/09/2016   Breast cancer of lower-inner quadrant of right female breast (HCC) 05/29/2014   Past Medical History:  Diagnosis Date   Anxiety    Breast cancer, right breast (HCC) 2016   Depression    H/O laparoscopy    History of hysteroscopy    HSV infection    Peripheral edema    on lasix    PONV (postoperative nausea and vomiting)    Radiation 10/30/14-12/17/14   right breast 50.4 gray   Past Surgical History:  Procedure Laterality Date   BREAST BIOPSY  05/16/2014   BREAST LUMPECTOMY Right 06/18/2014   bunion surgrery     CARPAL TUNNEL RELEASE Left 09/21/2016   Procedure: LEFT CARPAL TUNNEL RELEASE;  Surgeon: Murrell Kuba, MD;  Location: [facility];  Service: Orthopedics;  Laterality: Left;  REG/FAB   carpel tunnel     CHOLECYSTECTOMY  2015   COLONOSCOPY WITH PROPOFOL  N/A 05/27/2020   Procedure: COLONOSCOPY WITH PROPOFOL ;  Surgeon: Jinny Carmine, MD;  Location:  [facility];  Service: Endoscopy;  Laterality: N/A;  PATIENT COVID POSITIVE 04/18/2021   FOOT SURGERY  2024   HYSTERECTOMY ABDOMINAL WITH SALPINGO-OOPHORECTOMY  05/22/2018   Procedure: HYSTERECTOMY ABDOMINAL WITH BILATERAL SALPINGO-OOPHORECTOMY;  Surgeon: Lavoie, Marie-Lyne, MD;  Location: [facility] [place];  Service: Gynecology;;   PLANTAR FASCIECTOMY     PORTACATH PLACEMENT Left 06/18/2014   Procedure:  INSERTION PORT-A-CATH;  Surgeon: Morene Olives, MD;  Location: [facility];  Service: General;  Laterality: Left;   ROBOTIC ASSISTED LAPAROSCOPIC LYSIS OF ADHESION  05/22/2018   Procedure: XI ROBOTIC ASSISTED LAPAROSCOPIC LYSIS OF ADHESION;  Surgeon: Lavoie, Marie-Lyne, MD;  Location: [facility];  Service: Gynecology;;   ROBOTIC ASSISTED TOTAL HYSTERECTOMY WITH BILATERAL SALPINGO OOPHERECTOMY Bilateral 05/22/2018   Procedure: ATTEMPTED XI ROBOTIC ASSISTED TOTAL HYSTERECTOMY WITH BILATERAL SALPINGO OOPHORECTOMY;  Surgeon: Lavoie, Marie-Lyne, MD;  Location: [facility];  Service: Gynecology;  Laterality: Bilateral;   Family History  Problem Relation Age of Onset   Diabetes Father    Kidney cancer Father        kidney   Lung cancer Maternal Grandfather        deceased 28; smoker   Colon cancer Paternal Grandfather        deceased 75s   Prostate cancer Maternal Uncle        deceased 18   Diabetes Other    Stroke Other    Hyperlipidemia Other    Hypertension Other    COPD Other    Cancer Other        pat half-sister; ? uterine vs. ovarian ca   Outpatient Medications Prior to Visit  Medication Sig Dispense Refill   chlorpheniramine-HYDROcodone  (TUSSIONEX) 10-8 MG/5ML Take 5 mLs by mouth every 12 (twelve) hours as needed for cough. 110 mL 0   cycloSPORINE (RESTASIS) 0.05 % ophthalmic emulsion Place 1 drop into both eyes 2 (two) times daily.     Fluocinolone  Acetonide Body (DERMA-SMOOTHE /FS BODY) 0.01 % OIL Apply to body and extremities after showering once daily on Monday, Wednesday, and Friday. 118.28 mL 5   fluticasone  (FLONASE ) 50 MCG/ACT nasal spray Place 2 sprays into both nostrils daily. 16 g 6   furosemide  (LASIX ) 20 MG tablet Take 1 tablet (20 mg total) by mouth daily for 3 days. 3 tablet 0   ketoconazole  (NIZORAL ) 2 % cream Apply to aa's QD. 60 g 5   ketoconazole  (NIZORAL ) 2 % shampoo Shampoo into the skin let sit 5-10 minute then  wash out. Use 2-3 days per week. 120 mL 5   losartan -hydrochlorothiazide  (HYZAAR) 100-25 MG tablet Take 1 tablet by mouth daily. 90 tablet 0   mometasone  (ELOCON ) 0.1 % cream Apply 1 Application topically daily as needed (Rash). 45 g 5   potassium chloride  SA (KLOR-CON  M) 20 MEQ tablet Take 1 tablet (20 mEq total) by mouth daily. 30 tablet 5   sulfamethoxazole -trimethoprim  (BACTRIM  DS) 800-160 MG tablet Take 1 tablet by mouth 2 (two) times daily. 14 tablet 0   valACYclovir  (VALTREX ) 500 MG tablet Take 1 tablet (500 mg total) by mouth 2 (two) times daily as needed (outbreaks.). 60 tablet 3   Facility-Administered Medications Prior to Visit  Medication Dose Route Frequency Provider Last Rate Last Admin   triamcinolone  acetonide (KENALOG -40) injection 20 mg  20 mg Other Once        Allergies  Allergen Reactions   Augmentin [Amoxicillin-Pot Clavulanate] Itching   Review of Systems  Constitutional:  Negative for chills and fever.  HENT:  Positive for congestion.   Respiratory:  Positive for cough and sputum production. Negative for shortness of breath and wheezing.   Cardiovascular:  Negative for chest pain and palpitations.  Neurological:  Negative for dizziness and headaches.  Psychiatric/Behavioral:  Negative for suicidal ideas.    Objective:   Today's Vitals   03/23/24 1028  BP: 127/89  Pulse: 99  Resp: 16  Temp: 98.6 F (37 C)  TempSrc: Oral  SpO2: 98%  Weight: (!) 302 lb 6.4 oz (137.2 kg)  Height: 5' 4.5 (1.638 m)  PainSc: 0-No pain   Physical Exam Vitals reviewed.  Constitutional:      Appearance: Normal appearance.  HENT:     Right Ear: Tympanic membrane, ear canal and external ear normal.     Left Ear: Tympanic membrane, ear canal and external ear normal.     Nose: Congestion present.     Right Turbinates: Swollen.     Left Turbinates: Swollen.     Right Sinus: Maxillary sinus tenderness present. No frontal sinus tenderness.     Left Sinus: Maxillary sinus  tenderness present. No frontal sinus tenderness.     Mouth/Throat:     Pharynx: Postnasal drip present. No pharyngeal swelling, oropharyngeal exudate, posterior oropharyngeal erythema or uvula swelling.     Tonsils: No tonsillar exudate or tonsillar abscesses.  Cardiovascular:     Rate and Rhythm: Normal rate and regular rhythm.     Pulses: Normal pulses.     Heart sounds: Normal heart sounds.  Pulmonary:     Effort: Pulmonary effort is normal. No respiratory distress.     Breath sounds: Normal breath sounds. No wheezing.  Neurological:     Mental Status: She is alert.  Psychiatric:        Mood and Affect: Mood normal.        Behavior: Behavior normal.       Assessment & Plan:   1. Acute URI (Primary) Acute upper respiratory infection with acute cough. Persistent productive cough with sinus involvement. Previous treatments possibly inadequate. Prescribed azithromycin  based on efficacy. Prescribed prescription-strength Mucinex . Advised hydration with Mucinex . Advised patient return to office if symptoms persist or worsen- as it may be reasonable to obtain a chest x-ray.    2. Acute cough See #1     Return if symptoms worsen or fail to improve.    Patient to reach out to office if new, worrisome, or unresolved symptoms arise or if no improvement in patient's condition. Patient verbalized understanding and is agreeable to treatment plan. All questions answered to patient's satisfaction.    Evalene Arts, FNP

## 2024-04-02 ENCOUNTER — Ambulatory Visit: Admitting: Podiatry

## 2024-04-02 DIAGNOSIS — M722 Plantar fascial fibromatosis: Secondary | ICD-10-CM

## 2024-04-02 MED ORDER — TRIAMCINOLONE ACETONIDE 40 MG/ML IJ SUSP
20.0000 mg | Freq: Once | INTRAMUSCULAR | Status: AC
  Administered 2024-04-02: 12:00:00 20 mg

## 2024-04-02 NOTE — Progress Notes (Signed)
 She presents today complaining of painful plantar fasciitis left greater than right.  She is also complaining of some pain to the medial aspect of the left foot.  Objective: Vital signs stable alert oriented x 3.  Pulses are palpable.  She has pain on palpation medial calcaneal tubercles bilaterally.  She also has pain on palpation and attempted range of motion at the first tarsometatarsal joint possibly associated with osteoarthritic change or even some tibialis anterior tendinitis.  Assessment: Plantar fasciitis bilateral.  Arthritis first tarsometatarsal joint tibialis anterior tendinitis.  Plan: Discussed etiology pathology conservative versus surgical therapies discussed appropriate shoe gear stretching exercise ice therapy and shoe gear modifications.  Injected 20 mg of Kenalog  to the bilateral heels.  Tolerated procedure well without complications follow-up with her in 6 weeks

## 2024-04-05 ENCOUNTER — Ambulatory Visit: Admitting: Family Medicine

## 2024-04-13 ENCOUNTER — Encounter: Payer: Self-pay | Admitting: Family Medicine

## 2024-04-13 ENCOUNTER — Ambulatory Visit: Admitting: Family Medicine

## 2024-04-13 VITALS — BP 160/101 | HR 84 | Resp 16 | Ht 64.5 in | Wt 295.0 lb

## 2024-04-13 DIAGNOSIS — I10 Essential (primary) hypertension: Secondary | ICD-10-CM

## 2024-04-13 DIAGNOSIS — R051 Acute cough: Secondary | ICD-10-CM | POA: Diagnosis not present

## 2024-04-13 DIAGNOSIS — J3089 Other allergic rhinitis: Secondary | ICD-10-CM | POA: Diagnosis not present

## 2024-04-13 DIAGNOSIS — T7840XD Allergy, unspecified, subsequent encounter: Secondary | ICD-10-CM | POA: Diagnosis not present

## 2024-04-13 MED ORDER — AZELASTINE HCL 0.1 % NA SOLN: 2.0000 | Freq: Two times a day (BID) | NASAL | 11 refills | Status: AC

## 2024-04-13 MED ORDER — FLUTICASONE PROPIONATE 50 MCG/ACT NA SUSP: 2.0000 | Freq: Every day | NASAL | 6 refills | Status: AC

## 2024-04-13 MED ORDER — CETIRIZINE HCL 10 MG PO TABS: 10.0000 mg | ORAL_TABLET | Freq: Every day | ORAL | 11 refills | Status: AC

## 2024-04-13 NOTE — Assessment & Plan Note (Signed)
 She is using a Mucinex /Afrin product and it has probably caused her blood pressure to go up today.  Please stop using this product as it can cause rebound nasal congestion that is very difficult to deal with but also because it is running your blood pressure up.

## 2024-04-13 NOTE — Progress Notes (Signed)
 "  Established Patient Office Visit  Subjective   Patient ID: Krystal Kelley, female    DOB: [birth year]  Age: 53 y.o. MRN: [id number]  Chief Complaint  Patient presents with   Cough    Zpak helping     Cough   Discussed the use of AI scribe software for clinical note transcription with the patient, who gave verbal consent to proceed.  History of Present Illness   Krystal Kelley is a 53 year old female with hypertension who presents for a follow-up visit.  She has not been monitoring her blood pressure at home, assuming it to be well-controlled. Her blood pressure was mildly elevated at her last visit, which she attributes to rushing to the appointment. She is currently taking losartan  100mg  HCTZ 25mg  daily at 10:30 AM and occasionally takes Lasix  for ankle edema, with the last dose taken about a month and a half ago.  She has a history of recurrent upper respiratory infections, having been treated four times.  She had prednisone , Bactrim  twice, and a Z-Pak, which resolved the infections. Recently, she experienced a sneezing spell followed by nasal congestion. She has been using Mucinex  ina combined nasal spray containing Afrin for the congestion, which she started a week ago. She also uses a saline rinse for nasal lavage.  She reports expectorating mucus, which is mostly clear but sometimes yellow.      Review of Systems  Respiratory:  Positive for cough.       Objective:     BP (!) 160/101   Pulse 84   Resp 16   Ht 5' 4.5 (1.638 m)   Wt 295 lb (133.8 kg)   LMP  (LMP Unknown)   SpO2 96%   BMI 49.85 kg/m    Physical Exam Vitals and nursing note reviewed.  Constitutional:      Appearance: Normal appearance.  HENT:     Head: Normocephalic and atraumatic.  Eyes:     Conjunctiva/sclera: Conjunctivae normal.  Cardiovascular:     Rate and Rhythm: Normal rate and regular rhythm.  Pulmonary:     Effort: Pulmonary effort is normal.     Breath sounds: Normal breath  sounds.  Musculoskeletal:     Right lower leg: No edema.     Left lower leg: No edema.  Skin:    General: Skin is warm and dry.  Neurological:     Mental Status: She is alert and oriented to person, place, and time.  Psychiatric:        Mood and Affect: Mood normal.        Behavior: Behavior normal.        Thought Content: Thought content normal.        Judgment: Judgment normal.          No results found for any visits on 04/13/24.    The 10-year ASCVD risk score (Arnett DK, et al., 2019) is: 6.6%    Assessment & Plan:  Allergy, subsequent encounter -     Ambulatory referral to Allergy  Acute cough -     Fluticasone  Propionate; Place 2 sprays into both nostrils daily.  Dispense: 16 g; Refill: 6 -     Cetirizine  HCl; Take 1 tablet (10 mg total) by mouth daily.  Dispense: 30 tablet; Refill: 11 -     Azelastine  HCl; Place 2 sprays into both nostrils 2 (two) times daily. Use in each nostril as directed  Dispense: 30 mL; Refill: 11  Environmental and seasonal  allergies Assessment & Plan: She is having an allergy exacerbation with clear rhinorrhea.  Encouraged her to use Astelin , Zyrtec  and Flonase .  The she also has nasal lavage with saline.   Primary hypertension Assessment & Plan: She is using a Mucinex /Afrin product and it has probably caused her blood pressure to go up today.  Please stop using this product as it can cause rebound nasal congestion that is very difficult to deal with but also because it is running your blood pressure up.      Return in about 4 weeks (around 05/11/2024).    Audrea Bolte K Rorie Delmore, MD "

## 2024-04-13 NOTE — Assessment & Plan Note (Signed)
 She is having an allergy exacerbation with clear rhinorrhea.  Encouraged her to use Astelin , Zyrtec  and Flonase .  The she also has nasal lavage with saline.

## 2024-04-26 ENCOUNTER — Ambulatory Visit: Admitting: Family Medicine

## 2024-04-26 ENCOUNTER — Encounter: Payer: Self-pay | Admitting: Family Medicine

## 2024-04-26 VITALS — BP 150/70 | HR 94 | Temp 98.4°F | Ht 64.5 in | Wt 297.4 lb

## 2024-04-26 DIAGNOSIS — J328 Other chronic sinusitis: Secondary | ICD-10-CM | POA: Diagnosis not present

## 2024-04-26 DIAGNOSIS — R053 Chronic cough: Secondary | ICD-10-CM

## 2024-04-26 MED ORDER — CEPHALEXIN 500 MG PO CAPS: 500.0000 mg | ORAL_CAPSULE | Freq: Two times a day (BID) | ORAL | 0 refills | Status: AC

## 2024-04-26 MED ORDER — HYDROCOD POLI-CHLORPHE POLI ER 10-8 MG/5ML PO SUER: 5.0000 mL | Freq: Two times a day (BID) | ORAL | 0 refills | Status: AC | PRN

## 2024-04-26 NOTE — Progress Notes (Signed)
 "     Acute Care Office Visit  Subjective:   Krystal Kelley [birth year] 04/26/2024  Chief Complaint  Patient presents with   Cough    Still have a cough and congested in the nose and chest.    Discussed the use of AI scribe software for clinical note transcription with the patient, who gave verbal consent to proceed.  History of Present Illness   AUDRINA MARTEN is a 54 year old female with a history of sinus infections who presents with persistent cough, nasal congestion, and sinus pressure.  She has been experiencing persistent cough and nasal congestion that began approximately two weeks before Christmas. Initially, she had sneezing and nasal congestion, which led her to use a Mucinex  nasal spray similar to Afrin for about eight days. She experienced elevated blood pressure and discontinued its use after medical advice.  Her nasal congestion is severe, causing difficulty breathing through her nose and necessitating mouth breathing. She experiences sinus pressure, particularly when leaning forward, and her nasal discharge has changed from clear to yellow and thick. Her ears feel clogged and occasionally pop.  Her cough is productive, with thick yellow mucus. No fever is present, but she reports sinus pressure and headaches. Her treatment regimen includes Zyrtec , fluticasone  nasal spray, Astelin  nasal spray, and Mucinex  pills for congestion.  She recalls similar past experiences with prolonged sinus infections requiring multiple rounds of antibiotics- including prednisone , Bactrim  twice, and azithromycin . 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  She reports that now she is suffering from nasal congestion.   The following portions of the  patient's history were reviewed and updated as appropriate: past medical history, past surgical history, family history, social history, allergies, medications, and problem list.   Patient Active Problem List   Diagnosis Date Noted   Environmental and seasonal allergies 04/13/2024   Obesity, Class III, BMI 40-49.9 (morbid obesity) (HCC) 09/12/2023   Eustachian tube dysfunction, left 09/12/2023   Noncompliance with medication regimen 09/12/2023   Well woman exam with routine gynecological exam 07/28/2023   Trigger finger of right thumb 05/17/2023   Morbid obesity (HCC) 05/05/2023   Hypertension 05/05/2023   Encounter for screening colonoscopy 03/18/2020   Posttraumatic stress disorder 12/09/2016   Breast cancer of lower-inner quadrant of right female breast (HCC) 05/29/2014   Past Medical History:  Diagnosis Date   Anxiety    Breast cancer, right breast (HCC) 2016   Depression    H/O laparoscopy    History of hysteroscopy    HSV infection    Peripheral edema    on lasix    PONV (postoperative nausea and vomiting)    Radiation 10/30/14-12/17/14   right breast 50.4 gray   Past Surgical History:  Procedure Laterality Date   BREAST BIOPSY  05/16/2014   BREAST LUMPECTOMY Right 06/18/2014   bunion surgrery     CARPAL TUNNEL RELEASE Left 09/21/2016   Procedure: LEFT CARPAL TUNNEL RELEASE;  Surgeon: Murrell Kuba, MD;  Location: [facility];  Service: Orthopedics;  Laterality: Left;  REG/FAB   carpel tunnel     CHOLECYSTECTOMY  2015   COLONOSCOPY WITH PROPOFOL  N/A 05/27/2020   Procedure: COLONOSCOPY WITH PROPOFOL ;  Surgeon: Jinny Carmine, MD;  Location: [facility];  Service: Endoscopy;  Laterality: N/A;  PATIENT COVID  POSITIVE 04/18/2021   FOOT SURGERY  2024   HYSTERECTOMY ABDOMINAL WITH SALPINGO-OOPHORECTOMY  05/22/2018   Procedure: HYSTERECTOMY ABDOMINAL WITH BILATERAL SALPINGO-OOPHORECTOMY;  Surgeon: Lavoie, Marie-Lyne, MD;  Location: [facility];   Service: Gynecology;;   PLANTAR FASCIECTOMY     PORTACATH PLACEMENT Left 06/18/2014   Procedure: INSERTION PORT-A-CATH;  Surgeon: Morene Olives, MD;  Location: [facility];  Service: General;  Laterality: Left;   ROBOTIC ASSISTED LAPAROSCOPIC LYSIS OF ADHESION  05/22/2018   Procedure: XI ROBOTIC ASSISTED LAPAROSCOPIC LYSIS OF ADHESION;  Surgeon: Lavoie, Marie-Lyne, MD;  Location:  [place];  Service: Gynecology;;   ROBOTIC ASSISTED TOTAL HYSTERECTOMY WITH BILATERAL SALPINGO OOPHERECTOMY Bilateral 05/22/2018   Procedure: ATTEMPTED XI ROBOTIC ASSISTED TOTAL HYSTERECTOMY WITH BILATERAL SALPINGO OOPHORECTOMY;  Surgeon: Lavoie, Marie-Lyne, MD;  Location: [facility] ;  Service: Gynecology;  Laterality: Bilateral;   Family History  Problem Relation Age of Onset   Diabetes Father    Kidney cancer Father        kidney   Lung cancer Maternal Grandfather        deceased 39; smoker   Colon cancer Paternal Grandfather        deceased 74s   Prostate cancer Maternal Uncle        deceased 51   Diabetes Other    Stroke Other    Hyperlipidemia Other    Hypertension Other    COPD Other    Cancer Other        pat half-sister; ? uterine vs. ovarian ca   Outpatient Medications Prior to Visit  Medication Sig Dispense Refill   azelastine  (ASTELIN ) 0.1 % nasal spray Place 2 sprays into both nostrils 2 (two) times daily. Use in each nostril as directed 30 mL 11   cetirizine  (ZYRTEC ) 10 MG tablet Take 1 tablet (10 mg total) by mouth daily. 30 tablet 11   cycloSPORINE (RESTASIS) 0.05 % ophthalmic emulsion Place 1 drop into both eyes 2 (two) times daily.     Fluocinolone  Acetonide Body (DERMA-SMOOTHE /FS BODY) 0.01 % OIL Apply to body and extremities after showering once daily on Monday, Wednesday, and Friday. 118.28 mL 5   fluticasone  (FLONASE ) 50 MCG/ACT nasal spray Place 2 sprays into both nostrils daily. 16 g 6   furosemide  (LASIX ) 20 MG tablet Take 1 tablet  (20 mg total) by mouth daily for 3 days. 3 tablet 0   guaiFENesin  (MUCINEX ) 600 MG 12 hr tablet Take 2 tablets (1,200 mg total) by mouth daily with breakfast. 60 tablet 0   ketoconazole  (NIZORAL ) 2 % cream Apply to aa's QD. 60 g 5   ketoconazole  (NIZORAL ) 2 % shampoo Shampoo into the skin let sit 5-10 minute then wash out. Use 2-3 days per week. 120 mL 5   losartan -hydrochlorothiazide  (HYZAAR) 100-25 MG tablet TAKE 1 TABLET BY MOUTH DAILY 90 tablet 1   mometasone  (ELOCON ) 0.1 % cream Apply 1 Application topically daily as needed (Rash). 45 g 5   potassium chloride  SA (KLOR-CON  M) 20 MEQ tablet Take 1 tablet (20 mEq total) by mouth daily. 30 tablet 5   valACYclovir  (VALTREX ) 500 MG tablet Take 1 tablet (500 mg total) by mouth 2 (two) times daily as needed (outbreaks.). 60 tablet 3   chlorpheniramine-HYDROcodone  (TUSSIONEX) 10-8 MG/5ML Take 5 mLs by mouth every 12 (twelve) hours as needed for cough. 110 mL 0   losartan -hydrochlorothiazide  (HYZAAR) 100-25 MG tablet Take 1 tablet by mouth daily. 90 tablet 0   promethazine -dextromethorphan (PROMETHAZINE -DM) 6.25-15 MG/5ML  syrup Take 5 mLs by mouth 4 (four) times daily as needed for cough (Maximum dose: 30mL in 24 hours). (Patient not taking: Reported on 04/26/2024) 118 mL 0   No facility-administered medications prior to visit.   Allergies[1]  ROS: A complete ROS was performed with pertinent positives/negatives noted in the HPI. The remainder of the ROS are negative.    Objective:   Today's Vitals   04/26/24 1422  BP: (!) 155/91  Pulse: 94  Temp: 98.4 F (36.9 C)  SpO2: 93%  Weight: 297 lb 6.4 oz (134.9 kg)  Height: 5' 4.5 (1.638 m)   Physical Exam Vitals reviewed.  Constitutional:      Appearance: Normal appearance.  HENT:     Right Ear: Hearing, tympanic membrane, ear canal and external ear normal.     Left Ear: Hearing, tympanic membrane, ear canal and external ear normal.     Nose:     Right Turbinates: Enlarged and swollen.      Left Turbinates: Enlarged and swollen.     Right Sinus: Frontal sinus tenderness present. No maxillary sinus tenderness.     Left Sinus: Frontal sinus tenderness present. No maxillary sinus tenderness.     Mouth/Throat:     Pharynx: Postnasal drip present. No pharyngeal swelling, oropharyngeal exudate, posterior oropharyngeal erythema or uvula swelling.     Tonsils: No tonsillar exudate or tonsillar abscesses.  Cardiovascular:     Rate and Rhythm: Normal rate and regular rhythm.     Pulses: Normal pulses.     Heart sounds: Normal heart sounds, S1 normal and S2 normal.  Pulmonary:     Effort: Pulmonary effort is normal.     Breath sounds: Normal breath sounds and air entry.  Lymphadenopathy:     Cervical: No cervical adenopathy.  Neurological:     Mental Status: She is alert.  Psychiatric:        Mood and Affect: Mood normal.        Behavior: Behavior normal.      Assessment & Plan:   1. Chronic frontoethmoidal sinusitis (Primary) Chronic sinusitis with acute exacerbation in the frontal and ethmoid sinuses. History of recurrent sinus infections. Physical exam with tenderness to palpation of front sinuses. Nasal turbinates erythematous and swollen. Audible nasal congestion. Allergy to Augmentin, but Keflex  tolerated in the past. Prescribed Keflex  for 14 days. Continue Zyrtec  and fluticasone  nasal spray. Referred to ENT for further evaluation and management. - Ambulatory referral to ENT  2. Chronic cough Cough secondary to post-nasal drip from sinusitis. No fever, wheezing, shortness of breath, or difficulty breathing. Cardiovascular exam with heart regular rate and rhythm. Normal heart sounds, no murmurs present. No lower extremity edema present. Lungs clear to auscultation bilaterally without adventitious lung sounds. Prescribed cough syrup for symptomatic relief.  - Ambulatory referral to ENT - chlorpheniramine-HYDROcodone  (TUSSIONEX) 10-8 MG/5ML; Take 5 mLs by mouth every 12 (twelve)  hours as needed for cough.  Dispense: 70 mL; Refill: 0  Return if symptoms worsen or fail to improve.    Patient to reach out to office if new, worrisome, or unresolved symptoms arise or if no improvement in patient's condition. Patient verbalized understanding and is agreeable to treatment plan. All questions answered to patient's satisfaction.    Evalene Arts, FNP      [1]  Allergies Allergen Reactions   Augmentin [Amoxicillin-Pot Clavulanate] Itching   "

## 2024-04-26 NOTE — Patient Instructions (Signed)
 Vitamin Regimen:  Vitamin C 500mg  twice daily  Vitamin D 5000 units once daily  Zinc 50-75mg  once daily   Over the counter Medications (*unless allergic or contraindicated*):  Use Tylenol (acetaminophen) for fever/pain Use Advil (ibuprofen) for fever/pain/inflammation   Non-Medication Therapy:  Drink plenty of fluids and stay hydrated.  A teaspoon of honey may help ease coughing symptoms.  Cough drops or hard candy for coughing.   Over the Counter Medication Therapy:  Use a cough expectorant such as guaifenesin (Mucinex) if recommended by your doctor for a wet, congested cough. If you have high blood pressure, please ask your doctor first before using this.  Use a cough suppressant such as dextromethorphan (Robitussin/Delsym) for a dry cough. If you have high blood pressure, please ask your doctor first before using this.  If you have high blood pressure, medication such as Coricidin HBP is safe to take for your cough and will not increase your blood pressure.

## 2024-04-30 ENCOUNTER — Ambulatory Visit: Admitting: Podiatry

## 2024-04-30 DIAGNOSIS — M722 Plantar fascial fibromatosis: Secondary | ICD-10-CM | POA: Diagnosis not present

## 2024-04-30 MED ORDER — TRIAMCINOLONE ACETONIDE 40 MG/ML IJ SUSP
20.0000 mg | Freq: Once | INTRAMUSCULAR | Status: AC
  Administered 2024-04-30: 20 mg

## 2024-04-30 NOTE — Progress Notes (Signed)
 She presents today for follow-up of her plantar fasciitis states that it has been 4 weeks since my last injection as she refers to this left foot.  He was doing fine for quite a while and now all of a sudden is painful.  Objective: Vital signs stable blood pressure is 146/86, alert oriented x 3 there is no erythema edema salines drainage or odor.  She has pain on palpation medial can tubercle of the left heel.  Assessment: Plantar fasciitis left heel.  Plan: Reinjected left heel today 10 mg of Kenalog  pentagrams Marcaine .  Tolerated procedure well without complications follow-up with her as 1 month or 6 weeks.

## 2024-05-11 ENCOUNTER — Ambulatory Visit: Admitting: Family Medicine

## 2024-05-11 ENCOUNTER — Encounter: Payer: Self-pay | Admitting: Family Medicine

## 2024-05-11 DIAGNOSIS — E66813 Obesity, class 3: Secondary | ICD-10-CM | POA: Diagnosis not present

## 2024-05-11 DIAGNOSIS — I10 Essential (primary) hypertension: Secondary | ICD-10-CM

## 2024-05-11 MED ORDER — PHENTERMINE HCL 8 MG PO TABS: 1.0000 | ORAL_TABLET | Freq: Two times a day (BID) | ORAL | 0 refills | Status: AC

## 2024-05-11 NOTE — Progress Notes (Signed)
" ° °  Established Patient Office Visit  Subjective   Patient ID: Krystal Kelley, female    DOB: 1971-02-15  Age: 55 y.o. MRN: [id number]  Chief Complaint  Patient presents with   Medical Management of Chronic Issues    Follow up.     HPI Discussed the use of AI scribe software for clinical note transcription with the patient, who gave verbal consent to proceed.  History of Present Illness   Krystal Kelley is a 54 year old female who presents for follow-up regarding HTN, weight management and chronic sinus infection.    She has been managing her weight with medications including phentermine , naltrexone , and Topamax . Recently, she has not been taking naltrexone  due to uncertainty about its effectiveness without phentermine . She plans to continue with phentermine  and naltrexone , noting previous success with this combination.  She is currently taking losartan  and an antibiotic. She experienced a persistent cough that began on February 15, 2024, which did not resolve with two courses of Bactrim . She was later prescribed a Z-Pak, which coincided with the development of a sinus infection. Symptoms included ear popping, clogged ears, and unilateral headache. She is now on cephalexin  and reports significant improvement, with no cough for the past week. She has three days left of her antibiotic course.       Objective:     BP 132/74   Pulse 89   Temp 98.2 F (36.8 C) (Oral)   Resp 16   Ht 5' 4.5 (1.638 m)   Wt 298 lb 9.6 oz (135.4 kg)   LMP  (LMP Unknown)   SpO2 97%   BMI 50.46 kg/m    Physical Exam Vitals and nursing note reviewed.  Constitutional:      Appearance: Normal appearance.  HENT:     Head: Normocephalic and atraumatic.  Eyes:     Conjunctiva/sclera: Conjunctivae normal.  Cardiovascular:     Rate and Rhythm: Normal rate and regular rhythm.  Pulmonary:     Effort: Pulmonary effort is normal.     Breath sounds: Normal breath sounds.  Musculoskeletal:     Right lower  leg: No edema.     Left lower leg: No edema.  Skin:    General: Skin is warm and dry.  Neurological:     Mental Status: She is alert and oriented to person, place, and time.  Psychiatric:        Mood and Affect: Mood normal.        Behavior: Behavior normal.        Thought Content: Thought content normal.        Judgment: Judgment normal.          No results found for any visits on 05/11/24.    The 10-year ASCVD risk score (Arnett DK, et al., 2019) is: 3.4%    Assessment & Plan:  Morbid obesity (HCC) -     Phentermine  HCl; Take 1 tablet (8 mg total) by mouth 2 (two) times daily.  Dispense: 60 tablet; Refill: 0  Primary hypertension Assessment & Plan: Checked her BP by hand and it is 134/72.  She is taking Losartan  and hydrochlorothiazide .   Obesity, Class III, BMI 40-49.9 (morbid obesity) (HCC) Assessment & Plan: BMI is >50.  Wants to try phentermine .  Offered her 8mg  twice a day.  FOLLOW-UP in a month to check her BP      Return in about 4 weeks (around 06/08/2024) for BP recheck.    Hamid Brookens K Yosmar Ryker, MD "

## 2024-05-11 NOTE — Assessment & Plan Note (Signed)
 BMI is >50.  Wants to try phentermine .  Offered her 8mg  twice a day.  FOLLOW-UP in a month to check her BP

## 2024-05-11 NOTE — Assessment & Plan Note (Signed)
 Checked her BP by hand and it is 134/72.  She is taking Losartan  and hydrochlorothiazide .

## 2024-05-30 ENCOUNTER — Institutional Professional Consult (permissible substitution) (INDEPENDENT_AMBULATORY_CARE_PROVIDER_SITE_OTHER)

## 2024-06-05 ENCOUNTER — Encounter: Admitting: Adult Health

## 2024-06-08 ENCOUNTER — Ambulatory Visit: Admitting: Family Medicine

## 2024-06-11 ENCOUNTER — Ambulatory Visit: Admitting: Podiatry

## 2024-06-12 ENCOUNTER — Ambulatory Visit: Admitting: Family Medicine

## 2024-07-30 ENCOUNTER — Encounter: Admitting: Obstetrics and Gynecology

## 2024-08-07 ENCOUNTER — Encounter: Admitting: Obstetrics and Gynecology

## 2024-10-31 ENCOUNTER — Ambulatory Visit: Admitting: Dermatology
